# Patient Record
Sex: Female | Born: 1945
Health system: Southern US, Community
[De-identification: ages and names within clinical notes are randomized; demographics above are authoritative.]

## PROBLEM LIST (undated history)

## (undated) ENCOUNTER — Emergency Department (HOSPITAL_BASED_OUTPATIENT_CLINIC_OR_DEPARTMENT_OTHER): Admission: EM | Payer: Medicare Other | Source: Home / Self Care

## (undated) DIAGNOSIS — M549 Dorsalgia, unspecified: Secondary | ICD-10-CM

## (undated) DIAGNOSIS — M1990 Unspecified osteoarthritis, unspecified site: Secondary | ICD-10-CM

## (undated) DIAGNOSIS — G629 Polyneuropathy, unspecified: Secondary | ICD-10-CM

## (undated) DIAGNOSIS — L409 Psoriasis, unspecified: Secondary | ICD-10-CM

## (undated) DIAGNOSIS — C4490 Unspecified malignant neoplasm of skin, unspecified: Secondary | ICD-10-CM

## (undated) DIAGNOSIS — E119 Type 2 diabetes mellitus without complications: Secondary | ICD-10-CM

## (undated) DIAGNOSIS — Z853 Personal history of malignant neoplasm of breast: Secondary | ICD-10-CM

## (undated) DIAGNOSIS — IMO0001 Reserved for inherently not codable concepts without codable children: Secondary | ICD-10-CM

## (undated) DIAGNOSIS — T7840XA Allergy, unspecified, initial encounter: Secondary | ICD-10-CM

## (undated) DIAGNOSIS — I10 Essential (primary) hypertension: Secondary | ICD-10-CM

## (undated) DIAGNOSIS — R55 Syncope and collapse: Secondary | ICD-10-CM

## (undated) DIAGNOSIS — Z9889 Other specified postprocedural states: Secondary | ICD-10-CM

## (undated) DIAGNOSIS — G4733 Obstructive sleep apnea (adult) (pediatric): Secondary | ICD-10-CM

## (undated) DIAGNOSIS — E039 Hypothyroidism, unspecified: Secondary | ICD-10-CM

## (undated) DIAGNOSIS — R748 Abnormal levels of other serum enzymes: Secondary | ICD-10-CM

## (undated) DIAGNOSIS — E785 Hyperlipidemia, unspecified: Secondary | ICD-10-CM

## (undated) DIAGNOSIS — Z5189 Encounter for other specified aftercare: Secondary | ICD-10-CM

## (undated) DIAGNOSIS — H5440 Blindness, one eye, unspecified eye: Secondary | ICD-10-CM

## (undated) DIAGNOSIS — I890 Lymphedema, not elsewhere classified: Secondary | ICD-10-CM

## (undated) DIAGNOSIS — D689 Coagulation defect, unspecified: Secondary | ICD-10-CM

## (undated) DIAGNOSIS — G8929 Other chronic pain: Secondary | ICD-10-CM

## (undated) DIAGNOSIS — R112 Nausea with vomiting, unspecified: Secondary | ICD-10-CM

## (undated) DIAGNOSIS — D649 Anemia, unspecified: Secondary | ICD-10-CM

## (undated) DIAGNOSIS — C50919 Malignant neoplasm of unspecified site of unspecified female breast: Secondary | ICD-10-CM

## (undated) DIAGNOSIS — K8020 Calculus of gallbladder without cholecystitis without obstruction: Secondary | ICD-10-CM

## (undated) DIAGNOSIS — C801 Malignant (primary) neoplasm, unspecified: Secondary | ICD-10-CM

## (undated) DIAGNOSIS — K219 Gastro-esophageal reflux disease without esophagitis: Secondary | ICD-10-CM

## (undated) DIAGNOSIS — I2699 Other pulmonary embolism without acute cor pulmonale: Secondary | ICD-10-CM

## (undated) HISTORY — PX: MASTECTOMY: SHX3

## (undated) HISTORY — DX: Hyperlipidemia, unspecified: E78.5

## (undated) HISTORY — DX: Other pulmonary embolism without acute cor pulmonale: I26.99

## (undated) HISTORY — DX: Malignant (primary) neoplasm, unspecified: C80.1

## (undated) HISTORY — DX: Morbid (severe) obesity due to excess calories: E66.01

## (undated) HISTORY — DX: Syncope and collapse: R55

## (undated) HISTORY — DX: Coagulation defect, unspecified: D68.9

## (undated) HISTORY — DX: Unspecified malignant neoplasm of skin, unspecified: C44.90

## (undated) HISTORY — DX: Encounter for other specified aftercare: Z51.89

## (undated) HISTORY — PX: HIATAL HERNIA REPAIR: SHX195

## (undated) HISTORY — PX: EYE SURGERY: SHX253

## (undated) HISTORY — DX: Hypothyroidism, unspecified: E03.9

## (undated) HISTORY — DX: Personal history of malignant neoplasm of breast: Z85.3

## (undated) HISTORY — DX: Obstructive sleep apnea (adult) (pediatric): G47.33

## (undated) HISTORY — DX: Allergy, unspecified, initial encounter: T78.40XA

## (undated) HISTORY — DX: Blindness, one eye, unspecified eye: H54.40

## (undated) HISTORY — DX: Malignant neoplasm of unspecified site of unspecified female breast: C50.919

## (undated) HISTORY — DX: Gastro-esophageal reflux disease without esophagitis: K21.9

## (undated) HISTORY — DX: Essential (primary) hypertension: I10

## (undated) HISTORY — DX: Unspecified osteoarthritis, unspecified site: M19.90

## (undated) HISTORY — PX: ABDOMINAL HYSTERECTOMY: SHX81

## (undated) HISTORY — PX: BREAST SURGERY: SHX581

## (undated) HISTORY — DX: Reserved for inherently not codable concepts without codable children: IMO0001

## (undated) HISTORY — PX: OTHER SURGICAL HISTORY: SHX169

---

## 1992-07-31 HISTORY — PX: BONE MARROW TRANSPLANT: SHX200

## 1996-07-31 HISTORY — PX: KNEE ARTHROTOMY: SUR107

## 1998-11-02 ENCOUNTER — Ambulatory Visit (HOSPITAL_COMMUNITY): Admission: RE | Admit: 1998-11-02 | Discharge: 1998-11-02 | Payer: Self-pay | Admitting: Hematology and Oncology

## 1998-12-06 ENCOUNTER — Ambulatory Visit (HOSPITAL_COMMUNITY): Admission: RE | Admit: 1998-12-06 | Discharge: 1998-12-06 | Payer: Self-pay | Admitting: Endocrinology

## 1999-05-17 ENCOUNTER — Ambulatory Visit (HOSPITAL_COMMUNITY): Admission: RE | Admit: 1999-05-17 | Discharge: 1999-05-17 | Payer: Self-pay | Admitting: Hematology and Oncology

## 1999-05-17 ENCOUNTER — Encounter: Payer: Self-pay | Admitting: Hematology and Oncology

## 1999-11-16 ENCOUNTER — Encounter: Admission: RE | Admit: 1999-11-16 | Discharge: 1999-11-16 | Payer: Self-pay | Admitting: Endocrinology

## 1999-11-16 ENCOUNTER — Encounter: Payer: Self-pay | Admitting: Endocrinology

## 1999-11-29 ENCOUNTER — Encounter: Admission: RE | Admit: 1999-11-29 | Discharge: 2000-01-18 | Payer: Self-pay | Admitting: Surgery

## 2001-09-23 ENCOUNTER — Encounter: Payer: Self-pay | Admitting: Endocrinology

## 2001-09-23 ENCOUNTER — Encounter: Admission: RE | Admit: 2001-09-23 | Discharge: 2001-09-23 | Payer: Self-pay | Admitting: Endocrinology

## 2002-08-14 ENCOUNTER — Encounter: Admission: RE | Admit: 2002-08-14 | Discharge: 2002-09-12 | Payer: Self-pay | Admitting: Neurosurgery

## 2003-01-25 ENCOUNTER — Encounter: Payer: Self-pay | Admitting: Emergency Medicine

## 2003-01-25 ENCOUNTER — Emergency Department (HOSPITAL_COMMUNITY): Admission: EM | Admit: 2003-01-25 | Discharge: 2003-01-25 | Payer: Self-pay | Admitting: Emergency Medicine

## 2003-05-28 ENCOUNTER — Ambulatory Visit (HOSPITAL_COMMUNITY): Admission: RE | Admit: 2003-05-28 | Discharge: 2003-05-29 | Payer: Self-pay | Admitting: Ophthalmology

## 2003-11-05 ENCOUNTER — Encounter (HOSPITAL_COMMUNITY): Admission: RE | Admit: 2003-11-05 | Discharge: 2004-02-03 | Payer: Self-pay | Admitting: Endocrinology

## 2003-12-08 ENCOUNTER — Encounter: Admission: RE | Admit: 2003-12-08 | Discharge: 2003-12-08 | Payer: Self-pay | Admitting: Endocrinology

## 2004-06-03 ENCOUNTER — Encounter: Admission: RE | Admit: 2004-06-03 | Discharge: 2004-06-03 | Payer: Self-pay | Admitting: Endocrinology

## 2004-06-28 ENCOUNTER — Encounter: Admission: RE | Admit: 2004-06-28 | Discharge: 2004-06-28 | Payer: Self-pay | Admitting: Endocrinology

## 2004-08-19 ENCOUNTER — Ambulatory Visit: Payer: Self-pay | Admitting: Internal Medicine

## 2004-08-24 ENCOUNTER — Ambulatory Visit: Payer: Self-pay | Admitting: Internal Medicine

## 2004-08-29 ENCOUNTER — Ambulatory Visit: Payer: Self-pay | Admitting: Internal Medicine

## 2004-09-02 ENCOUNTER — Ambulatory Visit: Payer: Self-pay | Admitting: Internal Medicine

## 2004-09-06 ENCOUNTER — Ambulatory Visit: Payer: Self-pay | Admitting: Internal Medicine

## 2004-09-12 ENCOUNTER — Encounter (HOSPITAL_COMMUNITY): Admission: RE | Admit: 2004-09-12 | Discharge: 2004-12-11 | Payer: Self-pay | Admitting: Endocrinology

## 2004-09-14 ENCOUNTER — Ambulatory Visit: Payer: Self-pay | Admitting: Internal Medicine

## 2004-09-23 ENCOUNTER — Ambulatory Visit: Payer: Self-pay | Admitting: Internal Medicine

## 2004-09-26 ENCOUNTER — Ambulatory Visit: Payer: Self-pay | Admitting: Internal Medicine

## 2004-10-04 ENCOUNTER — Ambulatory Visit: Payer: Self-pay | Admitting: Internal Medicine

## 2004-10-07 ENCOUNTER — Ambulatory Visit: Payer: Self-pay | Admitting: Internal Medicine

## 2004-10-19 ENCOUNTER — Ambulatory Visit: Payer: Self-pay | Admitting: Internal Medicine

## 2004-10-20 ENCOUNTER — Ambulatory Visit: Payer: Self-pay | Admitting: Internal Medicine

## 2004-10-24 ENCOUNTER — Ambulatory Visit: Payer: Self-pay | Admitting: Internal Medicine

## 2004-10-28 ENCOUNTER — Ambulatory Visit: Payer: Self-pay | Admitting: Internal Medicine

## 2004-10-31 ENCOUNTER — Ambulatory Visit: Payer: Self-pay | Admitting: Internal Medicine

## 2004-11-22 ENCOUNTER — Ambulatory Visit: Payer: Self-pay | Admitting: Internal Medicine

## 2004-12-01 ENCOUNTER — Ambulatory Visit: Payer: Self-pay | Admitting: Internal Medicine

## 2005-01-10 ENCOUNTER — Ambulatory Visit: Payer: Self-pay | Admitting: Internal Medicine

## 2005-01-16 ENCOUNTER — Encounter (HOSPITAL_COMMUNITY): Admission: RE | Admit: 2005-01-16 | Discharge: 2005-01-24 | Payer: Self-pay | Admitting: Endocrinology

## 2005-01-17 ENCOUNTER — Ambulatory Visit: Payer: Self-pay | Admitting: Internal Medicine

## 2005-01-25 ENCOUNTER — Ambulatory Visit: Payer: Self-pay | Admitting: Internal Medicine

## 2005-02-01 ENCOUNTER — Ambulatory Visit: Payer: Self-pay | Admitting: Internal Medicine

## 2005-02-16 ENCOUNTER — Ambulatory Visit: Payer: Self-pay | Admitting: Internal Medicine

## 2005-02-21 ENCOUNTER — Ambulatory Visit: Payer: Self-pay | Admitting: Oncology

## 2005-03-27 ENCOUNTER — Ambulatory Visit: Payer: Self-pay | Admitting: Internal Medicine

## 2005-05-23 ENCOUNTER — Ambulatory Visit: Payer: Self-pay | Admitting: Internal Medicine

## 2005-05-23 HISTORY — PX: CARDIOVASCULAR STRESS TEST: SHX262

## 2005-06-01 ENCOUNTER — Ambulatory Visit: Payer: Self-pay | Admitting: Oncology

## 2005-06-02 ENCOUNTER — Ambulatory Visit: Payer: Self-pay | Admitting: Internal Medicine

## 2005-06-09 ENCOUNTER — Ambulatory Visit: Payer: Self-pay | Admitting: Internal Medicine

## 2005-07-18 ENCOUNTER — Ambulatory Visit: Payer: Self-pay | Admitting: Internal Medicine

## 2005-07-28 ENCOUNTER — Ambulatory Visit: Payer: Self-pay | Admitting: Internal Medicine

## 2005-08-01 ENCOUNTER — Encounter: Admission: RE | Admit: 2005-08-01 | Discharge: 2005-08-01 | Payer: Self-pay | Admitting: Endocrinology

## 2005-08-15 ENCOUNTER — Ambulatory Visit: Payer: Self-pay | Admitting: Internal Medicine

## 2005-09-04 ENCOUNTER — Ambulatory Visit: Payer: Self-pay | Admitting: Internal Medicine

## 2005-10-31 ENCOUNTER — Ambulatory Visit: Payer: Self-pay | Admitting: Oncology

## 2005-11-02 ENCOUNTER — Ambulatory Visit: Payer: Self-pay | Admitting: Internal Medicine

## 2005-11-02 LAB — CBC WITH DIFFERENTIAL/PLATELET
Basophils Absolute: 0.1 10*3/uL (ref 0.0–0.1)
EOS%: 1.7 % (ref 0.0–7.0)
HCT: 29.8 % — ABNORMAL LOW (ref 34.8–46.6)
HGB: 9.9 g/dL — ABNORMAL LOW (ref 11.6–15.9)
LYMPH%: 33.3 % (ref 14.0–48.0)
MCH: 28.9 pg (ref 26.0–34.0)
MCV: 87 fL (ref 81.0–101.0)
MONO%: 5.8 % (ref 0.0–13.0)
NEUT%: 58.3 % (ref 39.6–76.8)
Platelets: 224 10*3/uL (ref 145–400)

## 2005-11-21 ENCOUNTER — Ambulatory Visit: Payer: Self-pay | Admitting: Internal Medicine

## 2005-11-29 ENCOUNTER — Encounter: Payer: Self-pay | Admitting: Hematology and Oncology

## 2005-12-28 ENCOUNTER — Ambulatory Visit: Payer: Self-pay | Admitting: Internal Medicine

## 2006-01-30 ENCOUNTER — Ambulatory Visit: Payer: Self-pay | Admitting: Oncology

## 2006-02-02 ENCOUNTER — Ambulatory Visit (HOSPITAL_COMMUNITY): Admission: RE | Admit: 2006-02-02 | Discharge: 2006-02-02 | Payer: Self-pay | Admitting: Oncology

## 2006-02-07 ENCOUNTER — Ambulatory Visit: Payer: Self-pay | Admitting: Internal Medicine

## 2006-02-07 LAB — CBC WITH DIFFERENTIAL/PLATELET
Basophils Absolute: 0 10*3/uL (ref 0.0–0.1)
EOS%: 1.7 % (ref 0.0–7.0)
HCT: 31.5 % — ABNORMAL LOW (ref 34.8–46.6)
HGB: 10.5 g/dL — ABNORMAL LOW (ref 11.6–15.9)
MCH: 28.6 pg (ref 26.0–34.0)
MONO#: 0.3 10*3/uL (ref 0.1–0.9)
NEUT%: 64.3 % (ref 39.6–76.8)
lymph#: 1.7 10*3/uL (ref 0.9–3.3)

## 2006-02-15 ENCOUNTER — Ambulatory Visit: Payer: Self-pay | Admitting: Internal Medicine

## 2006-03-28 ENCOUNTER — Ambulatory Visit: Payer: Self-pay | Admitting: Oncology

## 2006-03-28 LAB — CBC WITH DIFFERENTIAL/PLATELET
BASO%: 1 % (ref 0.0–2.0)
Basophils Absolute: 0.1 10*3/uL (ref 0.0–0.1)
HCT: 35.1 % (ref 34.8–46.6)
HGB: 11.7 g/dL (ref 11.6–15.9)
MCHC: 33.3 g/dL (ref 32.0–36.0)
MONO#: 0.4 10*3/uL (ref 0.1–0.9)
NEUT%: 58.4 % (ref 39.6–76.8)
WBC: 5.8 10*3/uL (ref 3.9–10.0)
lymph#: 1.9 10*3/uL (ref 0.9–3.3)

## 2006-05-22 ENCOUNTER — Ambulatory Visit: Payer: Self-pay | Admitting: Internal Medicine

## 2006-08-03 ENCOUNTER — Emergency Department (HOSPITAL_COMMUNITY): Admission: EM | Admit: 2006-08-03 | Discharge: 2006-08-03 | Payer: Self-pay | Admitting: Emergency Medicine

## 2006-08-23 ENCOUNTER — Ambulatory Visit: Payer: Self-pay | Admitting: Oncology

## 2006-08-28 LAB — CBC WITH DIFFERENTIAL/PLATELET
EOS%: 1.8 % (ref 0.0–7.0)
LYMPH%: 27.4 % (ref 14.0–48.0)
MCH: 27.6 pg (ref 26.0–34.0)
MCV: 84.9 fL (ref 81.0–101.0)
MONO%: 1.7 % (ref 0.0–13.0)
RBC: 3.95 10*6/uL (ref 3.70–5.32)
RDW: 18.2 % — ABNORMAL HIGH (ref 11.3–14.5)

## 2006-08-28 LAB — FERRITIN: Ferritin: 828 ng/mL — ABNORMAL HIGH (ref 10–291)

## 2006-08-28 LAB — COMPREHENSIVE METABOLIC PANEL
ALT: 35 U/L (ref 0–35)
Albumin: 3.9 g/dL (ref 3.5–5.2)
Alkaline Phosphatase: 71 U/L (ref 39–117)
Glucose, Bld: 102 mg/dL — ABNORMAL HIGH (ref 70–99)
Potassium: 4 mEq/L (ref 3.5–5.3)
Sodium: 139 mEq/L (ref 135–145)
Total Bilirubin: 0.3 mg/dL (ref 0.3–1.2)
Total Protein: 6.7 g/dL (ref 6.0–8.3)

## 2006-09-18 ENCOUNTER — Ambulatory Visit: Payer: Self-pay | Admitting: Internal Medicine

## 2006-10-31 ENCOUNTER — Encounter: Admission: RE | Admit: 2006-10-31 | Discharge: 2006-10-31 | Payer: Self-pay | Admitting: Endocrinology

## 2006-11-13 ENCOUNTER — Ambulatory Visit: Payer: Self-pay | Admitting: Internal Medicine

## 2006-11-23 ENCOUNTER — Ambulatory Visit: Payer: Self-pay | Admitting: Oncology

## 2007-01-02 ENCOUNTER — Ambulatory Visit (HOSPITAL_BASED_OUTPATIENT_CLINIC_OR_DEPARTMENT_OTHER): Admission: RE | Admit: 2007-01-02 | Discharge: 2007-01-02 | Payer: Self-pay | Admitting: *Deleted

## 2007-01-06 ENCOUNTER — Ambulatory Visit: Payer: Self-pay | Admitting: Internal Medicine

## 2007-04-15 ENCOUNTER — Ambulatory Visit: Payer: Self-pay | Admitting: Oncology

## 2007-04-17 LAB — CBC WITH DIFFERENTIAL/PLATELET
BASO%: 0.9 % (ref 0.0–2.0)
Basophils Absolute: 0 10*3/uL (ref 0.0–0.1)
Eosinophils Absolute: 0.1 10*3/uL (ref 0.0–0.5)
HCT: 34.3 % — ABNORMAL LOW (ref 34.8–46.6)
HGB: 11.4 g/dL — ABNORMAL LOW (ref 11.6–15.9)
LYMPH%: 24.8 % (ref 14.0–48.0)
MONO#: 0.3 10*3/uL (ref 0.1–0.9)
NEUT#: 3.4 10*3/uL (ref 1.5–6.5)
NEUT%: 66.4 % (ref 39.6–76.8)
Platelets: 226 10*3/uL (ref 145–400)
WBC: 5.1 10*3/uL (ref 3.9–10.0)
lymph#: 1.3 10*3/uL (ref 0.9–3.3)

## 2007-04-17 LAB — COMPREHENSIVE METABOLIC PANEL
ALT: 20 U/L (ref 0–35)
AST: 20 U/L (ref 0–37)
CO2: 24 mEq/L (ref 19–32)
Calcium: 9.4 mg/dL (ref 8.4–10.5)
Chloride: 104 mEq/L (ref 96–112)
Sodium: 141 mEq/L (ref 135–145)
Total Bilirubin: 0.3 mg/dL (ref 0.3–1.2)
Total Protein: 7.4 g/dL (ref 6.0–8.3)

## 2007-04-17 LAB — IRON AND TIBC
%SAT: 14 % — ABNORMAL LOW (ref 20–55)
TIBC: 315 ug/dL (ref 250–470)
UIBC: 271 ug/dL

## 2007-06-23 ENCOUNTER — Emergency Department (HOSPITAL_COMMUNITY): Admission: EM | Admit: 2007-06-23 | Discharge: 2007-06-23 | Payer: Self-pay | Admitting: Emergency Medicine

## 2007-07-04 ENCOUNTER — Encounter
Admission: RE | Admit: 2007-07-04 | Discharge: 2007-07-04 | Payer: Self-pay | Admitting: Physical Medicine and Rehabilitation

## 2007-08-26 ENCOUNTER — Ambulatory Visit: Payer: Self-pay | Admitting: Vascular Surgery

## 2007-09-25 DIAGNOSIS — J4530 Mild persistent asthma, uncomplicated: Secondary | ICD-10-CM

## 2007-09-25 DIAGNOSIS — I2699 Other pulmonary embolism without acute cor pulmonale: Secondary | ICD-10-CM | POA: Insufficient documentation

## 2007-09-25 DIAGNOSIS — J302 Other seasonal allergic rhinitis: Secondary | ICD-10-CM

## 2007-09-25 DIAGNOSIS — J3089 Other allergic rhinitis: Secondary | ICD-10-CM

## 2007-09-25 DIAGNOSIS — Z853 Personal history of malignant neoplasm of breast: Secondary | ICD-10-CM

## 2007-09-26 ENCOUNTER — Ambulatory Visit: Payer: Self-pay | Admitting: Internal Medicine

## 2007-09-29 DIAGNOSIS — G4733 Obstructive sleep apnea (adult) (pediatric): Secondary | ICD-10-CM

## 2007-10-11 ENCOUNTER — Ambulatory Visit: Payer: Self-pay | Admitting: Oncology

## 2007-10-15 LAB — CBC WITH DIFFERENTIAL/PLATELET
BASO%: 1.6 % (ref 0.0–2.0)
MCHC: 32.3 g/dL (ref 32.0–36.0)
MONO#: 0.3 10*3/uL (ref 0.1–0.9)
RBC: 4.27 10*6/uL (ref 3.70–5.32)
RDW: 15.2 % — ABNORMAL HIGH (ref 11.3–14.5)
WBC: 5.6 10*3/uL (ref 3.9–10.0)
lymph#: 2.1 10*3/uL (ref 0.9–3.3)

## 2007-10-15 LAB — FERRITIN: Ferritin: 425 ng/mL — ABNORMAL HIGH (ref 10–291)

## 2007-10-15 LAB — IRON AND TIBC: Iron: 48 ug/dL (ref 42–145)

## 2007-12-09 ENCOUNTER — Encounter: Admission: RE | Admit: 2007-12-09 | Discharge: 2007-12-09 | Payer: Self-pay | Admitting: Endocrinology

## 2007-12-24 ENCOUNTER — Ambulatory Visit: Payer: Self-pay | Admitting: Internal Medicine

## 2008-01-28 ENCOUNTER — Ambulatory Visit: Payer: Self-pay | Admitting: Internal Medicine

## 2008-02-03 ENCOUNTER — Telehealth (INDEPENDENT_AMBULATORY_CARE_PROVIDER_SITE_OTHER): Payer: Self-pay | Admitting: *Deleted

## 2008-04-23 ENCOUNTER — Ambulatory Visit: Payer: Self-pay | Admitting: Oncology

## 2008-04-30 LAB — IRON AND TIBC
Iron: 53 ug/dL (ref 42–145)
TIBC: 353 ug/dL (ref 250–470)
UIBC: 300 ug/dL

## 2008-04-30 LAB — CBC WITH DIFFERENTIAL/PLATELET
Basophils Absolute: 0 10*3/uL (ref 0.0–0.1)
Eosinophils Absolute: 0.1 10*3/uL (ref 0.0–0.5)
HCT: 35.4 % (ref 34.8–46.6)
HGB: 11.8 g/dL (ref 11.6–15.9)
LYMPH%: 30.3 % (ref 14.0–48.0)
MCV: 85.1 fL (ref 81.0–101.0)
MONO%: 4.7 % (ref 0.0–13.0)
NEUT#: 3.3 10*3/uL (ref 1.5–6.5)
NEUT%: 63 % (ref 39.6–76.8)
Platelets: 216 10*3/uL (ref 145–400)

## 2008-04-30 LAB — COMPREHENSIVE METABOLIC PANEL
Alkaline Phosphatase: 75 U/L (ref 39–117)
BUN: 20 mg/dL (ref 6–23)
CO2: 25 mEq/L (ref 19–32)
Glucose, Bld: 101 mg/dL — ABNORMAL HIGH (ref 70–99)
Total Bilirubin: 0.3 mg/dL (ref 0.3–1.2)

## 2008-04-30 LAB — FERRITIN: Ferritin: 566 ng/mL — ABNORMAL HIGH (ref 10–291)

## 2008-05-13 ENCOUNTER — Encounter: Admission: RE | Admit: 2008-05-13 | Discharge: 2008-07-13 | Payer: Self-pay | Admitting: Oncology

## 2008-07-22 ENCOUNTER — Encounter: Admission: RE | Admit: 2008-07-22 | Discharge: 2008-07-22 | Payer: Self-pay | Admitting: Endocrinology

## 2008-07-31 LAB — HM MAMMOGRAPHY: HM Mammogram: NORMAL

## 2008-10-26 ENCOUNTER — Ambulatory Visit: Payer: Self-pay | Admitting: Oncology

## 2008-10-27 HISTORY — PX: US ECHOCARDIOGRAPHY: HXRAD669

## 2008-10-29 LAB — CBC WITH DIFFERENTIAL/PLATELET
BASO%: 1.3 % (ref 0.0–2.0)
Basophils Absolute: 0.1 10*3/uL (ref 0.0–0.1)
EOS%: 1.9 % (ref 0.0–7.0)
HCT: 35.4 % (ref 34.8–46.6)
HGB: 11.5 g/dL — ABNORMAL LOW (ref 11.6–15.9)
LYMPH%: 38.6 % (ref 14.0–49.7)
MCH: 27.1 pg (ref 25.1–34.0)
MCHC: 32.6 g/dL (ref 31.5–36.0)
MCV: 83 fL (ref 79.5–101.0)
NEUT%: 53.3 % (ref 38.4–76.8)
Platelets: 207 10*3/uL (ref 145–400)
lymph#: 1.9 10*3/uL (ref 0.9–3.3)

## 2008-10-29 LAB — COMPREHENSIVE METABOLIC PANEL
ALT: 14 U/L (ref 0–35)
AST: 17 U/L (ref 0–37)
BUN: 13 mg/dL (ref 6–23)
Calcium: 9.4 mg/dL (ref 8.4–10.5)
Chloride: 104 mEq/L (ref 96–112)
Creatinine, Ser: 1.09 mg/dL (ref 0.40–1.20)
Total Bilirubin: 0.3 mg/dL (ref 0.3–1.2)

## 2008-10-29 LAB — IRON AND TIBC
%SAT: 19 % — ABNORMAL LOW (ref 20–55)
Iron: 62 ug/dL (ref 42–145)
TIBC: 323 ug/dL (ref 250–470)

## 2008-10-29 LAB — FERRITIN: Ferritin: 470 ng/mL — ABNORMAL HIGH (ref 10–291)

## 2008-12-21 ENCOUNTER — Encounter: Admission: RE | Admit: 2008-12-21 | Discharge: 2008-12-21 | Payer: Self-pay | Admitting: Endocrinology

## 2008-12-23 ENCOUNTER — Ambulatory Visit: Payer: Self-pay | Admitting: Internal Medicine

## 2009-01-13 ENCOUNTER — Encounter: Payer: Self-pay | Admitting: Internal Medicine

## 2009-01-19 ENCOUNTER — Telehealth (INDEPENDENT_AMBULATORY_CARE_PROVIDER_SITE_OTHER): Payer: Self-pay | Admitting: *Deleted

## 2009-02-17 ENCOUNTER — Emergency Department (HOSPITAL_COMMUNITY): Admission: EM | Admit: 2009-02-17 | Discharge: 2009-02-17 | Payer: Self-pay | Admitting: Emergency Medicine

## 2009-02-25 ENCOUNTER — Ambulatory Visit (HOSPITAL_COMMUNITY): Admission: RE | Admit: 2009-02-25 | Discharge: 2009-02-25 | Payer: Self-pay | Admitting: *Deleted

## 2009-03-23 ENCOUNTER — Ambulatory Visit: Payer: Self-pay | Admitting: Internal Medicine

## 2009-03-30 ENCOUNTER — Telehealth: Payer: Self-pay | Admitting: Internal Medicine

## 2009-04-26 ENCOUNTER — Encounter: Admission: RE | Admit: 2009-04-26 | Discharge: 2009-04-26 | Payer: Self-pay | Admitting: Surgery

## 2009-04-28 ENCOUNTER — Ambulatory Visit: Payer: Self-pay | Admitting: Oncology

## 2009-04-30 LAB — COMPREHENSIVE METABOLIC PANEL
ALT: 20 U/L (ref 0–35)
Albumin: 3.8 g/dL (ref 3.5–5.2)
CO2: 19 mEq/L (ref 19–32)
Calcium: 8.9 mg/dL (ref 8.4–10.5)
Chloride: 106 mEq/L (ref 96–112)
Glucose, Bld: 104 mg/dL — ABNORMAL HIGH (ref 70–99)
Potassium: 3.8 mEq/L (ref 3.5–5.3)
Sodium: 139 mEq/L (ref 135–145)
Total Bilirubin: 0.3 mg/dL (ref 0.3–1.2)
Total Protein: 6.7 g/dL (ref 6.0–8.3)

## 2009-04-30 LAB — CBC WITH DIFFERENTIAL/PLATELET
BASO%: 0.8 % (ref 0.0–2.0)
Eosinophils Absolute: 0.1 10*3/uL (ref 0.0–0.5)
HGB: 11.5 g/dL — ABNORMAL LOW (ref 11.6–15.9)
LYMPH%: 31.5 % (ref 14.0–49.7)
MCH: 27.3 pg (ref 25.1–34.0)
MCHC: 32.7 g/dL (ref 31.5–36.0)
MCV: 83.6 fL (ref 79.5–101.0)
MONO#: 0.4 10*3/uL (ref 0.1–0.9)
RBC: 4.21 10*6/uL (ref 3.70–5.45)
lymph#: 2.3 10*3/uL (ref 0.9–3.3)

## 2009-04-30 LAB — FERRITIN: Ferritin: 560 ng/mL — ABNORMAL HIGH (ref 10–291)

## 2009-04-30 LAB — IRON AND TIBC: Iron: 55 ug/dL (ref 42–145)

## 2009-05-03 ENCOUNTER — Ambulatory Visit (HOSPITAL_COMMUNITY): Admission: RE | Admit: 2009-05-03 | Discharge: 2009-05-03 | Payer: Self-pay | Admitting: Surgery

## 2009-05-04 ENCOUNTER — Ambulatory Visit (HOSPITAL_COMMUNITY): Admission: RE | Admit: 2009-05-04 | Discharge: 2009-05-04 | Payer: Self-pay | Admitting: Surgery

## 2009-07-12 ENCOUNTER — Ambulatory Visit (HOSPITAL_COMMUNITY): Admission: RE | Admit: 2009-07-12 | Discharge: 2009-07-12 | Payer: Self-pay | Admitting: Surgery

## 2009-07-31 DIAGNOSIS — R55 Syncope and collapse: Secondary | ICD-10-CM

## 2009-07-31 HISTORY — PX: GASTRIC BYPASS: SHX52

## 2009-07-31 HISTORY — DX: Syncope and collapse: R55

## 2009-09-21 ENCOUNTER — Ambulatory Visit: Payer: Self-pay | Admitting: Internal Medicine

## 2009-10-14 ENCOUNTER — Encounter: Admission: RE | Admit: 2009-10-14 | Discharge: 2010-01-12 | Payer: Self-pay | Admitting: Surgery

## 2009-10-22 ENCOUNTER — Ambulatory Visit: Payer: Self-pay | Admitting: Oncology

## 2009-10-26 LAB — CBC WITH DIFFERENTIAL/PLATELET
EOS%: 0.7 % (ref 0.0–7.0)
Eosinophils Absolute: 0 10*3/uL (ref 0.0–0.5)
LYMPH%: 28.7 % (ref 14.0–49.7)
MCH: 27.8 pg (ref 25.1–34.0)
MCHC: 32.7 g/dL (ref 31.5–36.0)
MCV: 85.2 fL (ref 79.5–101.0)
MONO%: 4.9 % (ref 0.0–14.0)
NEUT#: 3.3 10*3/uL (ref 1.5–6.5)
NEUT%: 65.1 % (ref 38.4–76.8)
RDW: 16.1 % — ABNORMAL HIGH (ref 11.2–14.5)
lymph#: 1.5 10*3/uL (ref 0.9–3.3)

## 2009-10-26 LAB — COMPREHENSIVE METABOLIC PANEL
ALT: 15 U/L (ref 0–35)
AST: 19 U/L (ref 0–37)
Albumin: 4.3 g/dL (ref 3.5–5.2)
Alkaline Phosphatase: 76 U/L (ref 39–117)
CO2: 23 mEq/L (ref 19–32)
Creatinine, Ser: 1.04 mg/dL (ref 0.40–1.20)
Total Bilirubin: 0.4 mg/dL (ref 0.3–1.2)
Total Protein: 7.3 g/dL (ref 6.0–8.3)

## 2009-10-26 LAB — IRON AND TIBC
Iron: 47 ug/dL (ref 42–145)
TIBC: 306 ug/dL (ref 250–470)
UIBC: 259 ug/dL

## 2009-10-26 LAB — FERRITIN: Ferritin: 537 ng/mL — ABNORMAL HIGH (ref 10–291)

## 2009-10-28 ENCOUNTER — Ambulatory Visit (HOSPITAL_COMMUNITY): Admission: RE | Admit: 2009-10-28 | Discharge: 2009-10-28 | Payer: Self-pay | Admitting: Surgery

## 2009-11-01 ENCOUNTER — Inpatient Hospital Stay (HOSPITAL_COMMUNITY): Admission: RE | Admit: 2009-11-01 | Discharge: 2009-11-05 | Payer: Self-pay | Admitting: Surgery

## 2009-11-01 ENCOUNTER — Encounter (INDEPENDENT_AMBULATORY_CARE_PROVIDER_SITE_OTHER): Payer: Self-pay | Admitting: Surgery

## 2009-11-02 ENCOUNTER — Ambulatory Visit: Payer: Self-pay | Admitting: Vascular Surgery

## 2009-11-02 ENCOUNTER — Encounter (INDEPENDENT_AMBULATORY_CARE_PROVIDER_SITE_OTHER): Payer: Self-pay | Admitting: Surgery

## 2009-12-21 ENCOUNTER — Inpatient Hospital Stay (HOSPITAL_COMMUNITY)
Admission: EM | Admit: 2009-12-21 | Discharge: 2009-12-25 | Payer: Self-pay | Source: Home / Self Care | Admitting: Emergency Medicine

## 2009-12-22 ENCOUNTER — Ambulatory Visit: Payer: Self-pay | Admitting: Vascular Surgery

## 2010-01-04 ENCOUNTER — Ambulatory Visit: Admission: RE | Admit: 2010-01-04 | Discharge: 2010-01-04 | Payer: Self-pay | Admitting: Endocrinology

## 2010-01-04 ENCOUNTER — Encounter (INDEPENDENT_AMBULATORY_CARE_PROVIDER_SITE_OTHER): Payer: Self-pay | Admitting: Endocrinology

## 2010-01-04 ENCOUNTER — Inpatient Hospital Stay (HOSPITAL_COMMUNITY): Admission: EM | Admit: 2010-01-04 | Discharge: 2010-01-05 | Payer: Self-pay | Admitting: Emergency Medicine

## 2010-03-22 ENCOUNTER — Ambulatory Visit: Payer: Self-pay | Admitting: Internal Medicine

## 2010-04-29 ENCOUNTER — Ambulatory Visit: Payer: Self-pay | Admitting: Oncology

## 2010-05-03 LAB — CBC WITH DIFFERENTIAL/PLATELET
BASO%: 0.7 % (ref 0.0–2.0)
Basophils Absolute: 0 10*3/uL (ref 0.0–0.1)
EOS%: 3.4 % (ref 0.0–7.0)
HGB: 13 g/dL (ref 11.6–15.9)
LYMPH%: 34.3 % (ref 14.0–49.7)
MCHC: 32.8 g/dL (ref 31.5–36.0)
MCV: 84.3 fL (ref 79.5–101.0)
MONO%: 5.6 % (ref 0.0–14.0)
Platelets: 207 10*3/uL (ref 145–400)
RDW: 16.1 % — ABNORMAL HIGH (ref 11.2–14.5)
WBC: 4.4 10*3/uL (ref 3.9–10.3)
nRBC: 0 % (ref 0–0)

## 2010-05-03 LAB — COMPREHENSIVE METABOLIC PANEL
ALT: 26 U/L (ref 0–35)
AST: 25 U/L (ref 0–37)
Albumin: 4.2 g/dL (ref 3.5–5.2)
Alkaline Phosphatase: 125 U/L — ABNORMAL HIGH (ref 39–117)
BUN: 9 mg/dL (ref 6–23)
Calcium: 9.5 mg/dL (ref 8.4–10.5)
Chloride: 106 mEq/L (ref 96–112)
Potassium: 4.1 mEq/L (ref 3.5–5.3)
Total Protein: 6.8 g/dL (ref 6.0–8.3)

## 2010-08-20 ENCOUNTER — Encounter: Payer: Self-pay | Admitting: Endocrinology

## 2010-08-21 ENCOUNTER — Encounter: Payer: Self-pay | Admitting: Endocrinology

## 2010-08-30 NOTE — Assessment & Plan Note (Signed)
Summary: rov 6 months///kp   Primary Provider/Referring Provider:  Lucianne Muss  CC:  Pt here for 6 month follow up. Pt c/o "coughing spells" with small amount of white mucus, runny nose, and and 10 pound weight increase x 1 week.  History of Present Illness: History of Present Illness: 65 year old woman returning for follow-up of asthma and allergic rhinitis with history of hypersomnia and sleep apnea, pulmonary embolism, and breast cancer.  Daughter-in-law, developed a cough, 6 weeks ago.  Five days ago.  Patient had onset of head congestion and now feels stopped up.  Coughing thick, white sputum.  Fever without myalgias.  Sore throat.  No GI upset.  12/23/08- OSA, Asthma, allergic rhinitis, hx Pulm Embolism Uses cpap most nights- too much stuff- difficult to get up to bathroom- can't get back to sleep and not convinvinced she sleeps better. HS 830-9PM. Up around 230-3AM then reads or watches TV or lies in dark. Gets out of bed around 6AM and stays up with no naps. She c/o mask makes her sweat. Wears full face mask with chin strap.Says Dr Lucianne Muss working on chest wall cramps, weak spells, heartburn. denies cough and wheeze. Minor nasal stuffiness during Spring pollen. Being treated for anemia.  03/23/09- OSA, Asthma, allergic rhinitis, hx Pulm Embolism OSA- We had DME try to work with her on cpap comfort. Autotitration changed her from 11 to 6 and changed her to a nasaql pillows mask. She now says she "loves" it. She is able to wear it and sleep a whole lot  better than before. Wakes briefly around 1AM, then sleeps til 8 AM. asthma- Has her meds and feels in good control with no recent flare or concerns.  September 21, 2009- Asthma, allergic rhinitis, hx PE, OSA/ CPAP Occasional paroxysmal cough with scant mucus. Had flu and pneumovax last Fall. No obvious respiratory infection. Cough has woken her from sleep. Sips of water help. Some sneeze and blowing, but more sense of PNDrip. Watery rhinorhea.  Continues flonase and Advair. Little wheeze. Continues omeprazole. Notes oc choke with food or drink. Aware of reflux, watches diet due to gallstones.  Compliant with CPAP at 6 and likes nasal pillows mask.     Current Medications (verified): 1)  Advair Diskus 100-50 Mcg/dose Misc (Fluticasone-Salmeterol) .... Inhale 1 Puff As Directed Twice A Day 2)  Flonase 50 Mcg/act  Susp (Fluticasone Propionate) .... Two Puffs Each Nostril Daily 3)  Cpap 6 [facility] 4)  Metformin Hcl 500 Mg  Tabs (Metformin Hcl) .... Take 1 Tablet in The Am and 2 Tablets in The Pm 5)  Lipitor 40 Mg  Tabs (Atorvastatin Calcium) .... Take 1 Tablet By Mouth Once A Day 6)  Levothyroxine Sodium 125 Mcg Tabs (Levothyroxine Sodium) .... Take 1 By Mouth Once Daily 7)  Vitamin B-12 500 Mcg  Tabs (Cyanocobalamin) .... Once Daily 8)  Ferro-Bob 325 (65 Fe) Mg  Tabs (Ferrous Sulfate) .... Once Daily 9)  Coumadin 6 Mg  Tabs (Warfarin Sodium) .... As Directed 10)  Acetaminophen 325 Mg  Tabs (Acetaminophen) 11)  Omeprazole 20 Mg Cpdr (Omeprazole) .... Take 1 By Mouth Once Daily 12)  Gabapentin 300 Mg Caps (Gabapentin) .... Take 1 By Mouth Once Daily or As Directed 13)  Vitamin D (Ergocalciferol) 50000 Unit Caps (Ergocalciferol) .... Take 1 By Mouth Weekly 14)  Lasix 20 Mg Tabs (Furosemide) .... Take 1 Tablet By Mouth Once A Day 15)  Welchol 3.75 Gm Pack (Colesevelam Hcl) .... Every Other Day 16)  Hydrocodone 500mg  .... 1-2  Tabs As Needed Pain  Allergies (verified): No Known Drug Allergies  Past History:  Past Medical History: Last updated: December 30, 2008 Allergic Rhinitis Asthma Obstructive sleep apnea Pulmonary Embolism- chronic coumadin Breast cancer, hx of  Past Surgical History: Last updated: Dec 30, 2008 Right mastectomy Total Abdominal Hysterectomy bone spurs left knee  Family History: Last updated: Dec 31, 2007 Mother- deceased age 51; car accident Father- deceased age 22; lung cancer Sister- deceased age 35l lung  cancer 2 Brothers- living ages 6, 79; no medical problems  Asthma- daughter, grandmother Cancer- Father, Sister, aunt  Social History: Last updated: 31-Dec-2007 Patient states former smoker.  Retired Widowed with 2 children  Risk Factors: Smoking Status: quit (09/26/2007)  Review of Systems      See HPI       The patient complains of prolonged cough.  The patient denies anorexia, fever, weight loss, weight gain, vision loss, decreased hearing, hoarseness, chest pain, syncope, dyspnea on exertion, peripheral edema, headaches, hemoptysis, abdominal pain, and severe indigestion/heartburn.    Vital Signs:  Patient profile:   65 year old female Height:      70 inches Weight:      337.13 pounds O2 Sat:      96 % on Room air Pulse rate:   78 / minute BP sitting:   120 / 82  (left arm) Cuff size:   large  Vitals Entered By: Zackery Barefoot CMA (September 21, 2009 9:37 AM)  O2 Flow:  Room air CC: Pt here for 6 month follow up. Pt c/o "coughing spells" with small amount of white mucus, runny nose, and 10 pound weight increase x 1 week Comments Medications reviewed with patient Verified contact number and pharmacy with patient Zackery Barefoot CMA  September 21, 2009 9:39 AM    Physical Exam  Additional Exam:  General: A/Ox3; pleasant and cooperative, NAD, alert, very obese,  SKIN: no rash, lesions NODES: no lymphadenopathy HEENT: [place]/AT, EOM- WNL, Conjuctivae- clear, PERRLA, TM-WNL, Nose- clear, Throat- clear and wnl, Melampatti III NECK: Supple w/ fair ROM, JVD- none, normal carotid impulses w/o bruits Thyroid-, no stridor CHEST: Clear to P&A HEART: RRR, no m/g/r heard ABDOMEN: soft NWG:NFAO, nl pulses, no edema, heavy legs NEURO: Grossly intact to observation      Impression & Recommendations:  Problem # 1:  SLEEP APNEA (ICD-780.57)  Good cpap compliance and control.  Weight loss advised.  Problem # 2:  ASTHMA (ICD-493.90) Good control I suspect occasional reflux  causes more of her occasional paroxysmal coughs.  Problem # 3:  ALLERGIC RHINITIS (ICD-477.9)  Flonase helps some, but I advise use of otc antihistamine. Her updated medication list for this problem includes:    Flonase 50 Mcg/act Susp (Fluticasone propionate) .Marland Kitchen..Marland Kitchen Two puffs each nostril daily  Orders: Est. Patient Level III (13086)  Medications Added to Medication List This Visit: 1)  Lasix 20 Mg Tabs (Furosemide) .... Take 1 tablet by mouth once a day 2)  Welchol 3.75 Gm Pack (Colesevelam hcl) .... Every other day 3)  Hydrocodone 500mg   .... 1-2 tabs as needed pain  Patient Instructions: 1)  Please schedule a follow-up appointment in 6 months. 2)  Consider trying an otc antihistamine like Loratadine/ Claritin to dry up watery nose. You can take it once daily as needed. See if it reduces how often you have those coughing fits. 3)  Continue CPAP at 6.   Immunization History:  Influenza Immunization History:    Influenza:  historical (03/31/2009)

## 2010-08-30 NOTE — Assessment & Plan Note (Signed)
Summary: rov 6 months///kp   Primary Provider/Referring Provider:  Lucianne Muss  CC:  6 month follow up visit-sinus issues and cough-productive at times in the night  and morning.Marland Kitchen  History of Present Illness:  12/23/08- OSA, Asthma, allergic rhinitis, hx Pulm Embolism Uses cpap most nights- too much stuff- difficult to get up to bathroom- can't get back to sleep and not convinvinced she sleeps better. HS 830-9PM. Up around 230-3AM then reads or watches TV or lies in dark. Gets out of bed around 6AM and stays up with no naps. She c/o mask makes her sweat. Wears full face mask with chin strap.Says Dr Lucianne Muss working on chest wall cramps, weak spells, heartburn. denies cough and wheeze. Minor nasal stuffiness during Spring pollen. Being treated for anemia.  03/23/09- OSA, Asthma, allergic rhinitis, hx Pulm Embolism OSA- We had DME try to work with her on cpap comfort. Autotitration changed her from 11 to 6 and changed her to a nasal pillows mask. She now says she "loves" it. She is able to wear it and sleep a whole lot  better than before. Wakes briefly around 1AM, then sleeps til 8 AM. asthma- Has her meds and feels in good control with no recent flare or concerns.  March 22, 2010- OSA, Allergic rhinitis, hx Pulm embolism/ DVT/ Coumadin Some nights she skips CPAP. We discussed study showing more dementia when women with OSA don't  use CPAP. She likes nasal pillows mask. Pressure remains at 6. Asthma- uses Advair daily, but doesn't have a rescue inhaler. If she stays out in heat she may note a little tightness. This does sometimes wake her at night so she wil get up and walk around a little. Had gastric bypass, then C.diff colitis, then DVT treated with IVC filter and adjustment of coumadin.   Asthma History    Initial Asthma Severity Rating:    Age range: 12+ years    Symptoms: 0-2 days/week    Nighttime Awakenings: 3-4/month    Interferes w/ normal activity: no limitations    SABA use (not for  EIB): 0-2 days/week    Asthma Severity Assessment: Mild Persistent   Preventive Screening-Counseling & Management  Alcohol-Tobacco     Smoking Status: quit     Packs/Day: 2.0     Year Started: Age 6     Year Quit: 30 years ago     Tobacco Counseling: not to resume use of tobacco products  Current Medications (verified): 1)  Advair Diskus 100-50 Mcg/dose Misc (Fluticasone-Salmeterol) .... Inhale 1 Puff As Directed Twice A Day 2)  Flonase 50 Mcg/act  Susp (Fluticasone Propionate) .... Two Puffs Each Nostril Daily 3)  Cpap 6 [facility] 4)  Lipitor 40 Mg  Tabs (Atorvastatin Calcium) .... Take 1 Tablet By Mouth Once A Day 5)  Levothyroxine Sodium 125 Mcg Tabs (Levothyroxine Sodium) .... Take 1 By Mouth Once Daily 6)  Vitamin B-12 500 Mcg  Tabs (Cyanocobalamin) .... Once Daily 7)  Ferro-Bob 325 (65 Fe) Mg  Tabs (Ferrous Sulfate) .... Once Daily 8)  Coumadin 6 Mg  Tabs (Warfarin Sodium) .... Take 5mg  On Sun and Wed; 7.5mg  Other 5 Days 9)  Omeprazole 20 Mg Cpdr (Omeprazole) .... Take 1 By Mouth Once Daily 10)  Gabapentin 300 Mg Caps (Gabapentin) .... Take 1 By Mouth Once Daily or As Directed 11)  Vitamin D (Ergocalciferol) 50000 Unit Caps (Ergocalciferol) .... Take 1 By Mouth Weekly 12)  Lasix 20 Mg Tabs (Furosemide) .... Take 1 Tablet By Mouth Once A Day 13)  Hydrocodone 500mg  .... 1-2 Tabs As Needed Pain  Allergies (verified): No Known Drug Allergies  Past History:  Family History: Last updated: 01-10-08 Mother- deceased age 69; car accident Father- deceased age 38; lung cancer Sister- deceased age 4l lung cancer 2 Brothers- living ages 45, 83; no medical problems  Asthma- daughter, grandmother Cancer- Father, Sister, aunt  Social History: Last updated: 01-10-2008 Patient states former smoker.  Retired Widowed with 2 children  Risk Factors: Smoking Status: quit (03/22/2010) Packs/Day: 2.0 (03/22/2010)  Past Medical History: Allergic Rhinitis Asthma Obstructive sleep  apnea Pulmonary Embolism- chronic coumadin Breast cancer, hx of Blind right eye- hemorrhage  Past Surgical History: Right mastectomy Total Abdominal Hysterectomy bone spurs left knee Gastric bypass- bariatric surgery- 2011 IVC filter for recurrent DVT  Social History: Packs/Day:  2.0  Review of Systems      See HPI  The patient denies shortness of breath with activity, shortness of breath at rest, productive cough, non-productive cough, coughing up blood, chest pain, irregular heartbeats, acid heartburn, indigestion, loss of appetite, weight change, abdominal pain, difficulty swallowing, sore throat, tooth/dental problems, headaches, nasal congestion/difficulty breathing through nose, and sneezing.    Vital Signs:  Patient profile:   65 year old female Height:      70 inches Weight:      261.38 pounds BMI:     37.64 O2 Sat:      100 % on Room air Pulse rate:   71 / minute BP sitting:   104 / 64  (left arm) Cuff size:   large  Vitals Entered By: Reynaldo Minium CMA (March 22, 2010 9:56 AM)  O2 Flow:  Room air CC: 6 month follow up visit-sinus issues and cough-productive at times in the night  and morning.   Physical Exam  Additional Exam:  General: A/Ox3; pleasant and cooperative, NAD, alert,  obese,  SKIN: no rash, lesions NODES: no lymphadenopathy HEENT: [place]/AT, EOM- WNL, Conjuctivae- clear, PERRLA, TM-WNL, Nose- clear, Throat- clear and wnl, Mallampati III NECK: Supple w/ fair ROM, JVD- none, normal carotid impulses w/o bruits Thyroid-, no stridor CHEST: Clear to P&A HEART: RRR, no m/g/r heard ABDOMEN: soft ZOX:WRUE, nl pulses, no edema, heavy legs NEURO: Grossly intact to observation      Impression & Recommendations:  Problem # 1:  SLEEP APNEA (ICD-780.57)  Encouraged routine compliance with her CPAP and further weight loss.   Problem # 2:  Hx of PULMONARY EMBOLISM (ICD-415.19)  Recurrent DVT on chronic coumadin per Dr Clelia Croft at [facility]. Her updated  medication list for this problem includes:    Coumadin 6 Mg Tabs (Warfarin sodium) .Marland Kitchen... Take 5mg  on sun and wed; 7.5mg  other 5 days  Problem # 3:  ASTHMA (ICD-493.90) We discussed management and will give rescue inhaler and refill meds as discussed.  Medications Added to Medication List This Visit: 1)  Coumadin 6 Mg Tabs (Warfarin sodium) .... Take 5mg  on sun and wed; 7.5mg  other 5 days 2)  Proair Hfa 108 (90 Base) Mcg/act Aers (Albuterol sulfate) .... 2 puffs four times a day as needed rescue inhaler  Other Orders: Est. Patient Level IV (45409)  Patient Instructions: 1)  Please schedule a follow-up appointment in 1 year. 2)  Please keep using CPAP every night. Let us know if there are problems. 3)  Med refills and script for a rescue inhaler sent to drug store. Prescriptions: FLONASE 50 MCG/ACT  SUSP (FLUTICASONE PROPIONATE) Two puffs each nostril daily  #1 x prn   Entered and Authorized  by:   Waymon Budge MD   Signed by:   Waymon Budge MD on 03/22/2010   Method used:   Electronically to        [facility] (retail)       [address]       [place], [place]  [id number]       Ph: [phone]       Fax: [phone]   RxID:   [id number] ADVAIR DISKUS 100-50 MCG/DOSE MISC (FLUTICASONE-SALMETEROL) Inhale 1 puff as directed twice a day  #1 x prn   Entered and Authorized by:   Waymon Budge MD   Signed by:   Waymon Budge MD on 03/22/2010   Method used:   Electronically to        [facility] (retail)       [address]       [place], [place]  [id number]       Ph: [phone]       Fax: [phone]   RxID:   [id number] PROAIR HFA 108 (90 BASE) MCG/ACT AERS (ALBUTEROL SULFATE) 2 puffs four times a day as needed rescue inhaler  #1 x prn   Entered and Authorized by:   Waymon Budge MD   Signed by:   Waymon Budge MD on 03/22/2010   Method used:   Electronically to        [facility]  (retail)       [address]       [place], [place]  [id number]       Ph: [phone]       Fax: [phone]   RxID:   [id number]

## 2010-08-31 ENCOUNTER — Other Ambulatory Visit: Payer: Self-pay | Admitting: Podiatry

## 2010-09-07 ENCOUNTER — Other Ambulatory Visit: Payer: Self-pay | Admitting: Podiatry

## 2010-09-21 ENCOUNTER — Encounter: Payer: Self-pay | Admitting: Internal Medicine

## 2010-10-11 NOTE — Letter (Signed)
Summary: [facility]   Imported By: Sherian Rein 10/04/2010 12:50:07  _____________________________________________________________________  External Attachment:    Type:   Image     Comment:   External Document

## 2010-10-17 LAB — DIFFERENTIAL
Basophils Absolute: 0.1 10*3/uL (ref 0.0–0.1)
Basophils Relative: 1 % (ref 0–1)
Eosinophils Absolute: 0.1 10*3/uL (ref 0.0–0.7)
Eosinophils Absolute: 0 10*3/uL (ref 0.0–0.7)
Eosinophils Relative: 1 % (ref 0–5)
Lymphocytes Relative: 23 % (ref 12–46)
Lymphs Abs: 1.5 10*3/uL (ref 0.7–4.0)
Lymphs Abs: 1.5 10*3/uL (ref 0.7–4.0)
Monocytes Absolute: 0.5 10*3/uL (ref 0.1–1.0)
Monocytes Absolute: 0.6 10*3/uL (ref 0.1–1.0)
Monocytes Relative: 5 % (ref 3–12)
Neutro Abs: 4.5 10*3/uL (ref 1.7–7.7)
Neutro Abs: 7.4 10*3/uL (ref 1.7–7.7)
Neutrophils Relative %: 80 % — ABNORMAL HIGH (ref 43–77)

## 2010-10-17 LAB — PREPARE FRESH FROZEN PLASMA

## 2010-10-17 LAB — COMPREHENSIVE METABOLIC PANEL
ALT: 12 U/L (ref 0–35)
ALT: 18 U/L (ref 0–35)
AST: 21 U/L (ref 0–37)
Albumin: 2.8 g/dL — ABNORMAL LOW (ref 3.5–5.2)
Alkaline Phosphatase: 46 U/L (ref 39–117)
BUN: 10 mg/dL (ref 6–23)
CO2: 26 mEq/L (ref 19–32)
CO2: 31 mEq/L (ref 19–32)
Calcium: 8.4 mg/dL (ref 8.4–10.5)
Chloride: 96 mEq/L (ref 96–112)
Creatinine, Ser: 0.57 mg/dL (ref 0.4–1.2)
GFR calc Af Amer: >60 mL/min (ref >60)
GFR calc non Af Amer: >60 mL/min (ref >60)
Glucose, Bld: 166 mg/dL — ABNORMAL HIGH (ref 70–99)
Potassium: 2.8 mEq/L — ABNORMAL LOW (ref 3.5–5.1)
Sodium: 139 mEq/L (ref 135–145)
Sodium: 136 mEq/L (ref 135–145)
Total Bilirubin: 0.7 mg/dL (ref 0.3–1.2)
Total Protein: 5.6 g/dL — ABNORMAL LOW (ref 6.0–8.3)

## 2010-10-17 LAB — CLOSTRIDIUM DIFFICILE EIA: C difficile Toxins A+B, EIA: 28

## 2010-10-17 LAB — CBC
HCT: 31.1 % — ABNORMAL LOW (ref 36.0–46.0)
HCT: 16 % — ABNORMAL LOW (ref 36.0–46.0)
HCT: 26.4 % — ABNORMAL LOW (ref 36.0–46.0)
HCT: 27.6 % — ABNORMAL LOW (ref 36.0–46.0)
HCT: 28.2 % — ABNORMAL LOW (ref 36.0–46.0)
HCT: 31.5 % — ABNORMAL LOW (ref 36.0–46.0)
Hemoglobin: 9 g/dL — ABNORMAL LOW (ref 12.0–15.0)
Hemoglobin: 5.4 g/dL — CL (ref 12.0–15.0)
Hemoglobin: 8.9 g/dL — ABNORMAL LOW (ref 12.0–15.0)
Hemoglobin: 9.3 g/dL — ABNORMAL LOW (ref 12.0–15.0)
Hemoglobin: 9.4 g/dL — ABNORMAL LOW (ref 12.0–15.0)
Hemoglobin: 9.5 g/dL — ABNORMAL LOW (ref 12.0–15.0)
MCHC: 32.5 g/dL (ref 30.0–36.0)
MCHC: 33.6 g/dL (ref 30.0–36.0)
MCHC: 33.7 g/dL (ref 30.0–36.0)
MCHC: 33.8 g/dL (ref 30.0–36.0)
MCHC: 34.1 g/dL (ref 30.0–36.0)
MCV: 90.8 fL (ref 78.0–100.0)
MCV: 86.8 fL (ref 78.0–100.0)
MCV: 87.2 fL (ref 78.0–100.0)
MCV: 87.7 fL (ref 78.0–100.0)
Platelets: 160 10*3/uL (ref 150–400)
Platelets: 167 10*3/uL (ref 150–400)
Platelets: 197 10*3/uL (ref 150–400)
Platelets: 214 10*3/uL (ref 150–400)
Platelets: 219 10*3/uL (ref 150–400)
Platelets: 225 10*3/uL (ref 150–400)
Platelets: 242 10*3/uL (ref 150–400)
Platelets: ADEQUATE 10*3/uL (ref 150–400)
RBC: 1.89 MIL/uL — ABNORMAL LOW (ref 3.87–5.11)
RBC: 3.01 MIL/uL — ABNORMAL LOW (ref 3.87–5.11)
RBC: 3.14 MIL/uL — ABNORMAL LOW (ref 3.87–5.11)
RBC: 3.2 MIL/uL — ABNORMAL LOW (ref 3.87–5.11)
RBC: 3.62 MIL/uL — ABNORMAL LOW (ref 3.87–5.11)
RDW: 21 % — ABNORMAL HIGH (ref 11.5–15.5)
RDW: 17.1 % — ABNORMAL HIGH (ref 11.5–15.5)
RDW: 17.3 % — ABNORMAL HIGH (ref 11.5–15.5)
RDW: 17.5 % — ABNORMAL HIGH (ref 11.5–15.5)
RDW: 17.6 % — ABNORMAL HIGH (ref 11.5–15.5)
RDW: 17.6 % — ABNORMAL HIGH (ref 11.5–15.5)
RDW: 18.6 % — ABNORMAL HIGH (ref 11.5–15.5)
WBC: 7.5 10*3/uL (ref 4.0–10.5)
WBC: 6.7 10*3/uL (ref 4.0–10.5)
WBC: 7.4 10*3/uL (ref 4.0–10.5)
WBC: 9.2 10*3/uL (ref 4.0–10.5)
WBC: 9.3 10*3/uL (ref 4.0–10.5)

## 2010-10-17 LAB — URINE MICROSCOPIC-ADD ON

## 2010-10-17 LAB — ABO/RH: ABO/RH(D): A POS

## 2010-10-17 LAB — BRAIN NATRIURETIC PEPTIDE: Pro B Natriuretic peptide (BNP): 51.8 pg/mL (ref 0.0–100.0)

## 2010-10-17 LAB — PROTIME-INR
INR: 1.32 (ref 0.00–1.49)
INR: 1.48 (ref 0.00–1.49)
Prothrombin Time: 16.4 seconds — ABNORMAL HIGH (ref 11.6–15.2)
Prothrombin Time: 17.3 seconds — ABNORMAL HIGH (ref 11.6–15.2)
Prothrombin Time: 15.1 seconds (ref 11.6–15.2)
Prothrombin Time: 16.3 seconds — ABNORMAL HIGH (ref 11.6–15.2)

## 2010-10-17 LAB — BASIC METABOLIC PANEL
BUN: 7 mg/dL (ref 6–23)
BUN: 5 mg/dL — ABNORMAL LOW (ref 6–23)
BUN: 6 mg/dL (ref 6–23)
CO2: 28 mEq/L (ref 19–32)
CO2: 29 mEq/L (ref 19–32)
CO2: 30 mEq/L (ref 19–32)
Calcium: 7.9 mg/dL — ABNORMAL LOW (ref 8.4–10.5)
Calcium: 8.1 mg/dL — ABNORMAL LOW (ref 8.4–10.5)
Chloride: 107 mEq/L (ref 96–112)
Chloride: 102 mEq/L (ref 96–112)
Chloride: 104 mEq/L (ref 96–112)
Creatinine, Ser: 0.67 mg/dL (ref 0.4–1.2)
Creatinine, Ser: 0.71 mg/dL (ref 0.4–1.2)
GFR calc Af Amer: >60 mL/min (ref >60)
GFR calc Af Amer: >60 mL/min (ref >60)
Glucose, Bld: 121 mg/dL — ABNORMAL HIGH (ref 70–99)
Glucose, Bld: 124 mg/dL — ABNORMAL HIGH (ref 70–99)
Potassium: 3.6 mEq/L (ref 3.5–5.1)
Potassium: 3.9 mEq/L (ref 3.5–5.1)
Sodium: 138 mEq/L (ref 135–145)

## 2010-10-17 LAB — URINALYSIS, ROUTINE W REFLEX MICROSCOPIC
Glucose, UA: NEGATIVE mg/dL
Ketones, ur: 15 mg/dL — AB
pH: 8.5 — ABNORMAL HIGH (ref 5.0–8.0)

## 2010-10-17 LAB — APTT
aPTT: 28 seconds (ref 24–37)
aPTT: 34 seconds (ref 24–37)
aPTT: 27 seconds (ref 24–37)

## 2010-10-17 LAB — CARDIAC PANEL(CRET KIN+CKTOT+MB+TROPI)
CK, MB: 0.2 ng/mL — ABNORMAL LOW (ref 0.3–4.0)
Relative Index: INVALID (ref 0.0–2.5)
Total CK: 22 U/L (ref 7–177)
Total CK: 33 U/L (ref 7–177)
Troponin I: 0.01 ng/mL (ref 0.00–0.06)

## 2010-10-17 LAB — TSH: TSH: 1.921 u[IU]/mL (ref 0.350–4.500)

## 2010-10-17 LAB — TYPE AND SCREEN

## 2010-10-17 LAB — MAGNESIUM: Magnesium: 2.2 mg/dL (ref 1.5–2.5)

## 2010-10-17 LAB — PREPARE RBC (CROSSMATCH)

## 2010-10-19 LAB — CBC
Hemoglobin: 9.9 g/dL — ABNORMAL LOW (ref 12.0–15.0)
MCHC: 32.8 g/dL (ref 30.0–36.0)
MCHC: 32.9 g/dL (ref 30.0–36.0)
Platelets: 162 10*3/uL (ref 150–400)
RBC: 3.89 MIL/uL (ref 3.87–5.11)
RDW: 16.4 % — ABNORMAL HIGH (ref 11.5–15.5)
WBC: 9.9 10*3/uL (ref 4.0–10.5)

## 2010-10-19 LAB — GLUCOSE, CAPILLARY
Glucose-Capillary: 133 mg/dL — ABNORMAL HIGH (ref 70–99)
Glucose-Capillary: 137 mg/dL — ABNORMAL HIGH (ref 70–99)
Glucose-Capillary: 143 mg/dL — ABNORMAL HIGH (ref 70–99)
Glucose-Capillary: 156 mg/dL — ABNORMAL HIGH (ref 70–99)
Glucose-Capillary: 158 mg/dL — ABNORMAL HIGH (ref 70–99)
Glucose-Capillary: 158 mg/dL — ABNORMAL HIGH (ref 70–99)
Glucose-Capillary: 163 mg/dL — ABNORMAL HIGH (ref 70–99)
Glucose-Capillary: 184 mg/dL — ABNORMAL HIGH (ref 70–99)
Glucose-Capillary: 195 mg/dL — ABNORMAL HIGH (ref 70–99)
Glucose-Capillary: 88 mg/dL (ref 70–99)

## 2010-10-19 LAB — BASIC METABOLIC PANEL
CO2: 22 mEq/L (ref 19–32)
Calcium: 8.4 mg/dL (ref 8.4–10.5)
GFR calc Af Amer: >60 mL/min (ref >60)
Potassium: 3.5 mEq/L (ref 3.5–5.1)
Sodium: 139 mEq/L (ref 135–145)

## 2010-10-19 LAB — DIFFERENTIAL
Basophils Absolute: 0 10*3/uL (ref 0.0–0.1)
Basophils Relative: 0 % (ref 0–1)
Basophils Relative: 1 % (ref 0–1)
Eosinophils Relative: 0 % (ref 0–5)
Lymphs Abs: 1.5 10*3/uL (ref 0.7–4.0)
Monocytes Absolute: 0.3 10*3/uL (ref 0.1–1.0)
Monocytes Relative: 5 % (ref 3–12)
Neutro Abs: 7.8 10*3/uL — ABNORMAL HIGH (ref 1.7–7.7)
Neutrophils Relative %: 79 % — ABNORMAL HIGH (ref 43–77)

## 2010-10-19 LAB — HEMOGLOBIN AND HEMATOCRIT, BLOOD
HCT: 34 % — ABNORMAL LOW (ref 36.0–46.0)
Hemoglobin: 11 g/dL — ABNORMAL LOW (ref 12.0–15.0)

## 2010-10-24 LAB — COMPREHENSIVE METABOLIC PANEL
ALT: 18 U/L (ref 0–35)
ALT: 18 U/L (ref 0–35)
AST: 22 U/L (ref 0–37)
Albumin: 3.9 g/dL (ref 3.5–5.2)
Alkaline Phosphatase: 74 U/L (ref 39–117)
Alkaline Phosphatase: 82 U/L (ref 39–117)
BUN: 12 mg/dL (ref 6–23)
CO2: 27 mEq/L (ref 19–32)
Calcium: 9.4 mg/dL (ref 8.4–10.5)
Calcium: 9.8 mg/dL (ref 8.4–10.5)
GFR calc Af Amer: >60 mL/min (ref >60)
GFR calc non Af Amer: >60 mL/min (ref >60)
Glucose, Bld: 93 mg/dL (ref 70–99)
Glucose, Bld: 98 mg/dL (ref 70–99)
Potassium: 3.7 mEq/L (ref 3.5–5.1)
Potassium: 4.6 mEq/L (ref 3.5–5.1)
Sodium: 140 mEq/L (ref 135–145)
Total Protein: 7.2 g/dL (ref 6.0–8.3)
Total Protein: 7.7 g/dL (ref 6.0–8.3)

## 2010-10-24 LAB — DIFFERENTIAL
Basophils Relative: 0 % (ref 0–1)
Eosinophils Absolute: 0 10*3/uL (ref 0.0–0.7)
Lymphs Abs: 1.4 10*3/uL (ref 0.7–4.0)
Monocytes Absolute: 0.3 10*3/uL (ref 0.1–1.0)
Monocytes Relative: 5 % (ref 3–12)

## 2010-10-24 LAB — CBC
HCT: 35.7 % — ABNORMAL LOW (ref 36.0–46.0)
Hemoglobin: 11.8 g/dL — ABNORMAL LOW (ref 12.0–15.0)
Hemoglobin: 12.3 g/dL (ref 12.0–15.0)
MCHC: 33 g/dL (ref 30.0–36.0)
Platelets: 215 10*3/uL (ref 150–400)
RBC: 4.19 MIL/uL (ref 3.87–5.11)
RDW: 16 % — ABNORMAL HIGH (ref 11.5–15.5)
RDW: 16.2 % — ABNORMAL HIGH (ref 11.5–15.5)

## 2010-10-24 LAB — PROTIME-INR
INR: 2.33 — ABNORMAL HIGH (ref 0.00–1.49)
Prothrombin Time: 25.4 seconds — ABNORMAL HIGH (ref 11.6–15.2)

## 2010-10-27 ENCOUNTER — Ambulatory Visit (INDEPENDENT_AMBULATORY_CARE_PROVIDER_SITE_OTHER): Payer: Medicare Other | Admitting: Internal Medicine

## 2010-10-27 ENCOUNTER — Encounter: Payer: Self-pay | Admitting: Internal Medicine

## 2010-10-27 VITALS — BP 118/70 | HR 83 | Temp 98.1°F | Ht 70.0 in | Wt 220.0 lb

## 2010-10-27 DIAGNOSIS — M545 Low back pain: Secondary | ICD-10-CM

## 2010-10-27 DIAGNOSIS — E039 Hypothyroidism, unspecified: Secondary | ICD-10-CM

## 2010-10-27 DIAGNOSIS — R209 Unspecified disturbances of skin sensation: Secondary | ICD-10-CM

## 2010-10-27 DIAGNOSIS — R202 Paresthesia of skin: Secondary | ICD-10-CM

## 2010-10-27 NOTE — Assessment & Plan Note (Signed)
Will schedule NCS and EMG to look for neuropathy, she may need a sleep study to look for RLS, I advised her to increase her neurontin as she says that it helps her symptoms

## 2010-10-27 NOTE — Progress Notes (Signed)
Subjective:    Patient ID: Kelli Martinez, female    DOB: Nov 08, 1945, 65 y.o.   MRN: [id number]  HPI New to me she tells me that she has had trouble with her left leg for 6 months, she has numbness and tingling and says the left leg will not stay still. She saw Dr. Ethelene Hal about 3 months ago for an epidural in her low back and she says that he told her the symptoms were not coming from her low back. She does not have a bed partner and therefore does not know if her legs move at night. She tells me that neurontin helps her symptoms. She has had left leg swelling in the past from surgery and a DVT but she has not had any swelling recently. She does not have claudication.   Review of Systems  Constitutional: Negative for fever, chills, diaphoresis, activity change, appetite change, fatigue and unexpected weight change.  Respiratory: Negative for apnea, cough, choking, chest tightness, shortness of breath, wheezing and stridor.   Cardiovascular: Negative for chest pain and leg swelling.  Gastrointestinal: Negative for nausea, abdominal pain, diarrhea, constipation and blood in stool.  Genitourinary: Negative for dysuria, urgency, frequency and pelvic pain.  Musculoskeletal: Positive for back pain. Negative for myalgias, joint swelling, arthralgias and gait problem.  Skin: Negative for color change, pallor, rash and wound.  Neurological: Positive for numbness. Negative for dizziness, tremors, seizures, syncope, facial asymmetry, speech difficulty, weakness, light-headedness and headaches.  Hematological: Negative for adenopathy. Does not bruise/bleed easily.  Psychiatric/Behavioral: Negative for hallucinations, behavioral problems, confusion, self-injury, dysphoric mood, decreased concentration and agitation. The patient is not nervous/anxious.        Objective:   Physical Exam  Constitutional: She is oriented to person, place, and time. She appears well-developed and well-nourished. No  distress.  HENT:  Head: Normocephalic and atraumatic.  Right Ear: External ear normal.  Left Ear: External ear normal.  Nose: Nose normal.  Mouth/Throat: No oropharyngeal exudate.  Eyes: Conjunctivae and EOM are normal. Pupils are equal, round, and reactive to light. Right eye exhibits no discharge. Left eye exhibits no discharge. No scleral icterus.  Neck: Normal range of motion. Neck supple. No thyromegaly present.  Cardiovascular: Normal rate, regular rhythm, normal heart sounds and intact distal pulses.  Exam reveals no gallop and no friction rub.   No murmur heard. Pulmonary/Chest: Effort normal and breath sounds normal. No respiratory distress. She has no wheezes. She has no rales. She exhibits no tenderness.  Abdominal: Soft. Bowel sounds are normal. She exhibits no distension. There is no tenderness. There is no rebound and no guarding.  Musculoskeletal: Normal range of motion. She exhibits no edema and no tenderness.       Right lower leg: She exhibits no tenderness, no bony tenderness, no swelling, no edema and no deformity.       Left lower leg: She exhibits no tenderness, no bony tenderness, no swelling and no edema.  Lymphadenopathy:    She has no cervical adenopathy.  Neurological: She is oriented to person, place, and time. She displays no atrophy, no tremor and normal reflexes. No cranial nerve deficit or sensory deficit. She exhibits normal muscle tone. She displays no seizure activity. Coordination and gait normal.  Reflex Scores:      Tricep reflexes are 1+ on the right side and 1+ on the left side.      Bicep reflexes are 1+ on the right side and 1+ on the left side.  Brachioradialis reflexes are 1+ on the right side and 1+ on the left side.      Patellar reflexes are 1+ on the right side and 1+ on the left side.      Achilles reflexes are 1+ on the right side and 1+ on the left side. Skin: Skin is warm and dry. No rash noted. She is not diaphoretic. No erythema.    Psychiatric: She has a normal mood and affect. Her behavior is normal. Judgment and thought content normal.          Assessment & Plan:

## 2010-10-27 NOTE — Assessment & Plan Note (Signed)
She will continue to f/up with Dr. Ethelene Hal

## 2010-10-27 NOTE — Patient Instructions (Signed)
Back Pain & Injury Your back pain is most likely caused by a strain of the muscles or ligaments supporting the spine. Back strains cause pain and trouble moving because of muscle spasms. They may take several weeks to heal. Usually they are better in days.  Treatment for back pain includes:  Rest - Get bed rest as needed over the next day or two. Use a firm mattress and lie on your side with your knees slightly bent. If you lie on your back, put a pillow under your knees.   Early movement - Back pain improves most rapidly if you remain active. It is much more stressful on the back to sit or stand in one place. Do not sit, drive or stand in one place for more than 30 minutes at a time. Take short walks on level surfaces as soon as pain allows.   Limit bending and lifting - Do not bend over or lift anything over 20 pounds until instructed otherwise. Lift by bending your knees. Use your leg muscles to help. Keep the load close to your body and avoid twisting. Do not reach or do overhead work.   Medicines - Medicine to reduce pain and inflammation are helpful. Muscle-relaxing drugs may be prescribed.   Therapy - Put ice packs on your back every few hours for the first 2-3 days after your injury or as instructed. After that ice or heat may be alternated to reduce pain and spasm. Back exercises and gentle massage may be of some benefit. You should be examined again if your back pain is not better in one week.  SEEK IMMEDIATE MEDICAL CARE IF:  You have pain that radiates from your back into your legs.   You develop new bowel or bladder control problems.   You have unusual weakness or numbness in your arms or legs.   You develop nausea or vomiting.   You develop abdominal pain.   You feel faint.  Document Released: 07/17/2005 Document Re-Released: 04/25/2008 [employer] Patient Information 2011 [employer]. 

## 2010-10-27 NOTE — Assessment & Plan Note (Signed)
I did not do any labs on her today, she tells me that Dr. Lucianne Muss did labs about one month ago and that she is seeing him in a few weeks to do labs again

## 2010-11-01 ENCOUNTER — Other Ambulatory Visit: Payer: Self-pay | Admitting: Internal Medicine

## 2010-11-01 DIAGNOSIS — Z1231 Encounter for screening mammogram for malignant neoplasm of breast: Secondary | ICD-10-CM

## 2010-11-03 ENCOUNTER — Encounter (HOSPITAL_BASED_OUTPATIENT_CLINIC_OR_DEPARTMENT_OTHER): Payer: Medicare Other | Admitting: Oncology

## 2010-11-03 ENCOUNTER — Other Ambulatory Visit: Payer: Self-pay | Admitting: Oncology

## 2010-11-03 DIAGNOSIS — Z853 Personal history of malignant neoplasm of breast: Secondary | ICD-10-CM

## 2010-11-03 DIAGNOSIS — Z86718 Personal history of other venous thrombosis and embolism: Secondary | ICD-10-CM

## 2010-11-03 DIAGNOSIS — D638 Anemia in other chronic diseases classified elsewhere: Secondary | ICD-10-CM

## 2010-11-03 DIAGNOSIS — I890 Lymphedema, not elsewhere classified: Secondary | ICD-10-CM

## 2010-11-03 DIAGNOSIS — D509 Iron deficiency anemia, unspecified: Secondary | ICD-10-CM

## 2010-11-03 DIAGNOSIS — D649 Anemia, unspecified: Secondary | ICD-10-CM

## 2010-11-03 LAB — CBC WITH DIFFERENTIAL/PLATELET
BASO%: 1.6 % (ref 0.0–2.0)
Eosinophils Absolute: 0.1 10*3/uL (ref 0.0–0.5)
HCT: 37.9 % (ref 34.8–46.6)
LYMPH%: 44.5 % (ref 14.0–49.7)
MCHC: 32.5 g/dL (ref 31.5–36.0)
MCV: 86.3 fL (ref 79.5–101.0)
MONO%: 4.9 % (ref 0.0–14.0)
NEUT%: 47.6 % (ref 38.4–76.8)
Platelets: 202 10*3/uL (ref 145–400)
RBC: 4.39 10*6/uL (ref 3.70–5.45)

## 2010-11-03 LAB — COMPREHENSIVE METABOLIC PANEL
AST: 38 U/L — ABNORMAL HIGH (ref 0–37)
Alkaline Phosphatase: 87 U/L (ref 39–117)
BUN: 16 mg/dL (ref 6–23)
Calcium: 9.7 mg/dL (ref 8.4–10.5)
Chloride: 104 mEq/L (ref 96–112)
Creatinine, Ser: 0.87 mg/dL (ref 0.40–1.20)

## 2010-11-03 LAB — IRON AND TIBC
TIBC: 317 ug/dL (ref 250–470)
UIBC: 206 ug/dL

## 2010-11-06 LAB — GLUCOSE, CAPILLARY: Glucose-Capillary: 106 mg/dL — ABNORMAL HIGH (ref 70–99)

## 2010-11-06 LAB — DIFFERENTIAL
Eosinophils Relative: 0 % (ref 0–5)
Lymphocytes Relative: 12 % (ref 12–46)
Monocytes Absolute: 0.4 10*3/uL (ref 0.1–1.0)
Monocytes Relative: 4 % (ref 3–12)
Neutro Abs: 7 10*3/uL (ref 1.7–7.7)

## 2010-11-06 LAB — POCT CARDIAC MARKERS: Troponin i, poc: <0.05 ng/mL (ref 0.00–0.09)

## 2010-11-06 LAB — URINE MICROSCOPIC-ADD ON

## 2010-11-06 LAB — CBC
HCT: 34.7 % — ABNORMAL LOW (ref 36.0–46.0)
Hemoglobin: 11.3 g/dL — ABNORMAL LOW (ref 12.0–15.0)
MCHC: 32.4 g/dL (ref 30.0–36.0)
RBC: 4.08 MIL/uL (ref 3.87–5.11)
RDW: 17.3 % — ABNORMAL HIGH (ref 11.5–15.5)

## 2010-11-06 LAB — URINALYSIS, ROUTINE W REFLEX MICROSCOPIC
Glucose, UA: NEGATIVE mg/dL
Protein, ur: NEGATIVE mg/dL
Specific Gravity, Urine: 1.011 (ref 1.005–1.030)

## 2010-11-06 LAB — POCT I-STAT, CHEM 8
BUN: 18 mg/dL (ref 6–23)
Calcium, Ion: 1.05 mmol/L — ABNORMAL LOW (ref 1.12–1.32)
Chloride: 103 mEq/L (ref 96–112)
Glucose, Bld: 97 mg/dL (ref 70–99)
TCO2: 27 mmol/L (ref 0–100)

## 2010-11-17 ENCOUNTER — Ambulatory Visit: Payer: Self-pay | Admitting: Cardiology

## 2010-12-03 ENCOUNTER — Inpatient Hospital Stay (INDEPENDENT_AMBULATORY_CARE_PROVIDER_SITE_OTHER)
Admission: RE | Admit: 2010-12-03 | Discharge: 2010-12-03 | Disposition: A | Discharge status: Home / Self Care | Payer: Medicare Other | Source: Ambulatory Visit | Attending: Family Medicine | Admitting: Family Medicine

## 2010-12-03 DIAGNOSIS — T148XXA Other injury of unspecified body region, initial encounter: Secondary | ICD-10-CM

## 2010-12-05 ENCOUNTER — Ambulatory Visit
Admission: RE | Admit: 2010-12-05 | Discharge: 2010-12-05 | Disposition: A | Discharge status: Home / Self Care | Payer: Medicare Other | Source: Ambulatory Visit | Attending: Internal Medicine | Admitting: Internal Medicine

## 2010-12-05 ENCOUNTER — Other Ambulatory Visit: Payer: Self-pay | Admitting: Internal Medicine

## 2010-12-05 DIAGNOSIS — Z1231 Encounter for screening mammogram for malignant neoplasm of breast: Secondary | ICD-10-CM

## 2010-12-13 NOTE — Procedures (Signed)
NAME:  Kelli Martinez, Kelli Martinez           ACCOUNT NO.:  [id number]   MEDICAL RECORD NO.:  [id number]          PATIENT TYPE:  OUT   LOCATION:  SLEEP CENTER                 FACILITY[facility]  [facility]   PHYSICIAN:  Clinton D. Maple Hudson, MD, FCCP, FACPDATE OF BIRTH:  02-Aug-1947   DATE OF STUDY:  01/02/2007                            NOCTURNAL POLYSOMNOGRAM   REFERRING PHYSICIAN:  Rosine Abe, M.D.   INDICATION FOR STUDY:  Insomnia with sleep apnea.   EPWORTH SLEEPINESS SCORE:  2/24, BMI 47, weight 320 pounds.   MEDICATIONS:  Is listed and reviewed.   SLEEP ARCHITECTURE:  Total sleep time short, 270 minutes, with sleep  efficiency 69%.  Stage 1 was 10%, stage 2 79%, stages III and IV absent,  REM 11% of total sleep time.  Sleep latency 16 minutes, REM latency 276  minutes, awake after sleep onset 98 minutes, arousal index 23.7.   MEDICATIONS TAKEN AT BEDTIME:  Actos, magnesium, Darvocet, gabapentin,  and Diovan HCT.   RESPIRATORY DATA:  Split study protocol.  Apnea-hypopnea index (AHI,  RDI) 17.3 obstructive events per hour, indicating moderate obstructive  sleep apnea/hypopnea syndrome before CPAP.  There were 14 obstructive  apneas and 23 hypopneas before CPAP.  Most events were recorded while  sleeping supine.  REM AHI 0.  CPAP was titrated to 11 CWP, AHI 0 per  hour.  A medium Mirage Quattro mask was used with heated humidifier.   OXYGEN DATA:  Moderate snoring with oxygen desaturation to a nadir of  90%.  Mean oxygen saturation with CPAP control was 96% on room air.   CARDIAC DATA:  Normal sinus rhythm.   MOVEMENT-PARASOMNIA:  No significant movement disturbance.  Bathroom x3  did interrupt sleep.   IMPRESSIONS-RECOMMENDATIONS:  1. Moderate obstructive sleep apnea/hypopnea syndrome, apnea-hypopnea      index 17.3 per hour, with positional events most frequent while      supine, moderate snoring and oxygen desaturation to a nadir of 90%.  2. Successful continuous positive airway pressure  titration to 11 CWP,      apnea-hypopnea index 0 per hour.  A      medium Mirage Quattro mask was used with heated humidifier.  3. Bathroom times three.      Clinton D. Maple Hudson, MD, [facility], FACP  Diplomate, Biomedical engineer of Sleep Medicine  Electronically Signed     CDY/MEDQ  D:  01/06/2007 10:27:07  T:  01/06/2007 17:53:10  Job:  [id number]

## 2010-12-13 NOTE — Procedures (Signed)
DUPLEX DEEP VENOUS EXAM - LOWER EXTREMITY   INDICATION:  Left calf pain and swelling.   HISTORY:  Edema:  Yes.  Trauma/Surgery:  No.  Pain:  Yes.  PE:  Yes.  Previous DVT:  Yes.  Anticoagulants:  Yes (Coumadin).  Other:   DUPLEX EXAM:                CFV   SFV   PopV  PTV    GSV                R  L  R  L  R  L  R   L  R  L  Thrombosis    0  0     0     0      0     0  Spontaneous   +  +     +     +      +     +  Phasic        +  +     +     +      +     +  Augmentation  +  +     +     +      +     +  Compressible  +  +     +     +      +     +  Competent     +  +     +     +      +     +   Legend:  + - yes  o - no  p - partial  D - decreased   IMPRESSION:  No evidence of deep venous thrombosis noted in left leg.   Notify Crysta with the results.    _____________________________  Janetta Hora Fields, MD   MG/MEDQ  D:  08/26/2007  T:  08/27/2007  Job:  [id number]

## 2010-12-16 NOTE — Assessment & Plan Note (Signed)
[facility]                             PULMONARY OFFICE NOTE   NAME:TISDALEDennie, Kelli Martinez                  MRN:          [id number]  DATE:11/13/2006                            DOB:          1946/03/25    PROBLEMS:  1. Allergic rhinitis.  2. Asthma.  3. History of breast cancer.  4. History of pulmonary embolism/chronic Coumadin/anemia.   HISTORY:  She is doing pretty well now and expects that she will do well  through the summer, now that she is made it this far.  Occasional Visine  for her eyes, stable exertional dyspnea, does not try to do very much  but no sudden events.   MEDICATIONS:  1. Advair 100/50.  2. Flonase.  3. Coumadin.  4. Metformin 500 mg a.m., 1000 mg p.m.  5. Diovan 160 mg.  6. Actos 30 mg.  7. Quinine 325 mg.  8. Lipitor 40 mg.  9. Magnesium the 4 mg.  10.Iron.  11.Glimepiride 2 mg.  12.Levothyroxine 50 mg.  13.Gabapentin.  14.Darvocet.   MEDICATIONS:  No medication allergies.   OBJECTIVE:  Weight 324 pounds.  BP 122/64, pulse regular 85, room air  saturation 99%.  There are a few scattered chest crackles without dullness or increased  work of breathing.  Heart sounds are regular without murmur heard.  Eyes and nose are both a little watery, but not injected.  Pharynx is  clear.   IMPRESSION:  1. Mild rhinitis and conjunctivitis, seasonal allergic, off vaccine.  2. Dyspnea is multifactorial reflecting obesity and deconditioning, as      well as her previous history of pulmonary embolism and her anemia.   PLAN:  Walk for endurance and weight control as tolerated.  Over-the-  counter antihistamine p.r.n. with discussion.  Scheduled to return in  six months, but earlier p.r.n.     Clinton D. Maple Hudson, MD, Tonny Bollman, FACP  Electronically Signed    CDY/MedQ  DD: 11/13/2006  DT: 11/14/2006  Job #: [id number]   cc:   [name], M.D.  Blenda Nicely. [employer]

## 2010-12-16 NOTE — Op Note (Signed)
NAME:  Kelli Martinez, Kelli Martinez                     ACCOUNT NO.:  [id number]   MEDICAL RECORD NO.:  [id number]                   PATIENT TYPE:  OIB   LOCATION:  2550                                 FACILITY:  [facility]   PHYSICIAN:  Beulah Gandy. Ashley Royalty, M.D.              DATE OF BIRTH:  11-18-1945   DATE OF PROCEDURE:  05/28/2003  DATE OF DISCHARGE:                                 OPERATIVE REPORT   ADMISSION DIAGNOSIS:  Traction retinal detachment and peripheral retinal  breaks in the left eye.   PROCEDURES:  Pars plana vitrectomy with membrane peel, retinal  photocoagulation, endocautery, and gas/fluid exchange in the left eye.   SURGEON:  Beulah Gandy. Ashley Royalty, M.D.   ASSISTANT:  _____________   ANESTHESIA:  General.   DESCRIPTION OF PROCEDURE:  The usual prep and drape.  Peritomies at 10, 2,  and 4 o'clock.  The 4 mm infusion port was anchored into place at 4 o'clock.  The lighted pick and the cutter were placed at 10 and 2 o'clock,  respectively.  Provisc was placed on the corneal surface.  The Bion was  moved into place.  The pars plana vitrectomy was begun just behind the  crystalline lens where white vitreous material was encountered.  This was  carefully removed under low suction and rapid cutting.  The vitrectomy was  carried out into the peripheral vitreous area down to the vitreous base.  Vitreous was attached to the break at 6 o'clock and this was carefully  removed with the vitreous cutter and trimmed down to the retinal surface.  The vitreous was attached to the lower and upper arcade temporally, as well  as nasally.  Vitreous forceps were used to grasp the stump of the area of  traction and lifted free from its attachments to the lower and upper arcade.  The membrane was peeled nasally and several small retinal breaks were  uncovered.  The endocautery was used to cauterize vascularization in the  vitreous scar.  This was then carefully trimmed with the vitreous cutter  down to  the retinal surface.  The indirect ophthalmoscope laser was moved  into place and 948 burns placed around the retinal periphery with a power of  800 milliwatts, 1000 microns each and 0.1 seconds each.  The endolaser was  placed nasally in the posterior pole around the areas of retinal breaks.  A  total gas/fluid exchange was then carried out.  Perfluoropropane 14% was  exchanged for intravitreal gas.  The instruments were removed from the eye  and 9-0 nylon was used to close the sclerotomy sites.  Weck-cel sponge test  found tight wounds.  The conjunctiva was closed with wet-field cautery.  Polymixin and gentamicin were irrigated into Tenon's space.  Atropine  solution was applied.  Decadron 10 mg was injected into the lower  subconjunctival space.  The closing tension was 15 with a Barraquer  tonometer.  Complications:  None.  Duration:  One hour.  Polysporin, a  patch, and shield were placed.  The patient was taken to recovery in  satisfactory condition.                                                Beulah Gandy. Ashley Royalty, M.D.    JDM/MEDQ  D:  05/28/2003  T:  05/28/2003  Job:  [id number]

## 2011-01-20 ENCOUNTER — Telehealth: Payer: Self-pay | Admitting: Internal Medicine

## 2011-01-20 MED ORDER — ALBUTEROL SULFATE HFA 108 (90 BASE) MCG/ACT IN AERS: 2.0000 | INHALATION_SPRAY | Freq: Four times a day (QID) | RESPIRATORY_TRACT | Status: DC | PRN

## 2011-01-20 NOTE — Telephone Encounter (Signed)
Rx sent to [facility] and left message at given number that phone message has been taken care of.

## 2011-01-26 ENCOUNTER — Other Ambulatory Visit: Payer: Self-pay | Admitting: *Deleted

## 2011-01-26 MED ORDER — FLUTICASONE PROPIONATE 50 MCG/ACT NA SUSP: 2.0000 | Freq: Every day | NASAL | Status: DC

## 2011-01-30 ENCOUNTER — Other Ambulatory Visit: Payer: Self-pay | Admitting: Oncology

## 2011-01-30 ENCOUNTER — Encounter (HOSPITAL_BASED_OUTPATIENT_CLINIC_OR_DEPARTMENT_OTHER): Payer: Medicare Other | Admitting: Oncology

## 2011-01-30 DIAGNOSIS — Z853 Personal history of malignant neoplasm of breast: Secondary | ICD-10-CM

## 2011-01-30 DIAGNOSIS — I890 Lymphedema, not elsewhere classified: Secondary | ICD-10-CM

## 2011-01-30 DIAGNOSIS — D509 Iron deficiency anemia, unspecified: Secondary | ICD-10-CM

## 2011-01-30 DIAGNOSIS — D649 Anemia, unspecified: Secondary | ICD-10-CM

## 2011-01-30 LAB — FERRITIN: Ferritin: 654 ng/mL — ABNORMAL HIGH (ref 10–291)

## 2011-01-30 LAB — CBC WITH DIFFERENTIAL/PLATELET
BASO%: 0.8 % (ref 0.0–2.0)
Basophils Absolute: 0 10*3/uL (ref 0.0–0.1)
Eosinophils Absolute: 0.1 10*3/uL (ref 0.0–0.5)
HCT: 37.1 % (ref 34.8–46.6)
HGB: 12.2 g/dL (ref 11.6–15.9)
MONO#: 0.2 10*3/uL (ref 0.1–0.9)
NEUT#: 2.2 10*3/uL (ref 1.5–6.5)
NEUT%: 48.2 % (ref 38.4–76.8)
Platelets: 167 10*3/uL (ref 145–400)
WBC: 4.6 10*3/uL (ref 3.9–10.3)
lymph#: 2 10*3/uL (ref 0.9–3.3)

## 2011-01-30 LAB — COMPREHENSIVE METABOLIC PANEL
ALT: 50 U/L — ABNORMAL HIGH (ref 0–35)
BUN: 14 mg/dL (ref 6–23)
CO2: 28 mEq/L (ref 19–32)
Calcium: 9.2 mg/dL (ref 8.4–10.5)
Chloride: 101 mEq/L (ref 96–112)
Creatinine, Ser: 0.88 mg/dL (ref 0.50–1.10)
Glucose, Bld: 117 mg/dL — ABNORMAL HIGH (ref 70–99)

## 2011-01-30 LAB — IRON AND TIBC
Iron: 72 ug/dL (ref 42–145)
TIBC: 331 ug/dL (ref 250–470)

## 2011-03-08 ENCOUNTER — Encounter (INDEPENDENT_AMBULATORY_CARE_PROVIDER_SITE_OTHER): Payer: Medicare Other | Admitting: Ophthalmology

## 2011-03-08 ENCOUNTER — Other Ambulatory Visit: Payer: Self-pay | Admitting: Gastroenterology

## 2011-03-08 DIAGNOSIS — H35039 Hypertensive retinopathy, unspecified eye: Secondary | ICD-10-CM

## 2011-03-08 DIAGNOSIS — H43819 Vitreous degeneration, unspecified eye: Secondary | ICD-10-CM

## 2011-03-08 DIAGNOSIS — D490 Neoplasm of unspecified behavior of digestive system: Secondary | ICD-10-CM

## 2011-03-08 DIAGNOSIS — H348392 Tributary (branch) retinal vein occlusion, unspecified eye, stable: Secondary | ICD-10-CM

## 2011-03-08 DIAGNOSIS — H33309 Unspecified retinal break, unspecified eye: Secondary | ICD-10-CM

## 2011-03-14 ENCOUNTER — Ambulatory Visit
Admission: RE | Admit: 2011-03-14 | Discharge: 2011-03-14 | Disposition: A | Discharge status: Home / Self Care | Payer: Medicare Other | Source: Ambulatory Visit | Attending: Gastroenterology | Admitting: Gastroenterology

## 2011-03-21 ENCOUNTER — Ambulatory Visit (INDEPENDENT_AMBULATORY_CARE_PROVIDER_SITE_OTHER): Payer: Medicare Other | Admitting: Internal Medicine

## 2011-03-21 ENCOUNTER — Encounter: Payer: Self-pay | Admitting: Internal Medicine

## 2011-03-21 VITALS — BP 118/82 | HR 66 | Ht 68.0 in | Wt 219.2 lb

## 2011-03-21 DIAGNOSIS — J309 Allergic rhinitis, unspecified: Secondary | ICD-10-CM

## 2011-03-21 DIAGNOSIS — G4730 Sleep apnea, unspecified: Secondary | ICD-10-CM

## 2011-03-21 DIAGNOSIS — Z853 Personal history of malignant neoplasm of breast: Secondary | ICD-10-CM

## 2011-03-21 DIAGNOSIS — I2699 Other pulmonary embolism without acute cor pulmonale: Secondary | ICD-10-CM

## 2011-03-21 DIAGNOSIS — J45909 Unspecified asthma, uncomplicated: Secondary | ICD-10-CM

## 2011-03-21 MED ORDER — ALBUTEROL SULFATE HFA 108 (90 BASE) MCG/ACT IN AERS: 2.0000 | INHALATION_SPRAY | Freq: Four times a day (QID) | RESPIRATORY_TRACT | Status: DC | PRN

## 2011-03-21 NOTE — Assessment & Plan Note (Signed)
Good control, but needs rescue inhaler refilled and we discussed medication roles.

## 2011-03-21 NOTE — Progress Notes (Signed)
Subjective:    Patient ID: Kelli Martinez, female    DOB: 10/20/1945, 65 y.o.   MRN: [id number]  HPI 03/21/11- 35 yoF former smoker followed for OSA, allergic rhinitis,asthma,  hx pulm embolism/DVT/filter/coumadin(Dr Lucianne Muss), hx R breast Ca/ mastectomy/ xrt/chem/bonemarrow transplant at [facility], hx gastric bypass Last here March 22, 2010 Uses CPAP 6 [facility]- most of each night. Averages 6 hrs sleep/ night. Pressure seems right. Wakes in AM with dry mouth. Sleeps alone.  Wakes with dry mouth and stuffy nose.  Continues Advair. Dry cough can lead to asthma. Needs rescue inhaler refilled.- needed it daily while working summer garden.  Review of Systems Constitutional:   No-   weight loss, night sweats, fevers, chills, fatigue, lassitude. HEENT:   No-  headaches, difficulty swallowing, tooth/dental problems, sore throat,   +dry mouth      No-  sneezing, itching, ear ache,              +nasal congestion, post nasal drip,  CV:  No-   chest pain, orthopnea, PND, swelling in lower extremities, anasarca, dizziness, palpitations Resp: No-   shortness of breath with exertion or at rest.              No-   productive cough,  No non-productive cough,  No-  coughing up of blood.              No-   change in color of mucus.   Skin: No-   rash or lesions. GI:  No-   heartburn, indigestion, abdominal pain, nausea, vomiting, diarrhea,                 change in bowel habits, loss of appetite GU: No-   dysuria, change in color of urine, no urgency or frequency.  No- flank pain. MS:  No-   joint pain or swelling.  No- decreased range of motion.  No- back pain. Neuro- :  Psych:  No- change in mood or affect. No depression or anxiety.  No memory loss.      Objective:   Physical Exam General- Alert, Oriented, Affect-appropriate, Distress- none acute  obese Skin- rash-none, lesions- none, excoriation- none. XRT skin changes right lateral neck Lymphadenopathy- none Head- atraumatic            Eyes- Gross  vision intact, PERRLA, conjunctivae clear secretions            Ears- Hearing, canals normal            Nose- Clear, no- Septal dev, mucus, polyps, erosion, perforation             Throat- Mallampati II , mucosa clear , drainage- none, tonsils- atrophic Neck- flexible , trachea midline, no stridor , thyroid nl, carotid no bruit Chest - symmetrical excursion , unlabored           Heart/CV- RRR , no murmur , no gallop  , no rub, nl s1 s2                           - JVD- none , edema- none, stasis changes- none, varices- none           Lung- clear to P&A, wheeze- none, cough- none , dullness-none, rub- none           Chest wall-  Abd- tender-no, distended-no, bowel sounds-present, HSM- no Br/ Gen/ Rectal- Not done, not indicated Extrem- cyanosis- none, clubbing, none, atrophy- none, strength-  nl   Heavy legs Neuro- grossly intact to observation         Assessment & Plan:

## 2011-03-21 NOTE — Assessment & Plan Note (Signed)
More often stuffy. I am introducing Neti pot that might help more than a medication.

## 2011-03-21 NOTE — Patient Instructions (Signed)
Continue CPAP at 6- our goal is all night every night  Try otc Biotene for dry mouth- your drug store will have it in several forms- like mouth wash or spray. Using it at night before sleep, and as often as you need, should help some with the dry mouth.    Script sent to [facility] refilling rescue inhaler Proair  Try Neti pot saline rinse for your nasal passages if over dry or stuffy

## 2011-03-21 NOTE — Assessment & Plan Note (Signed)
There are comfort issues, mainly related to mouth breathing and dryness. We will see if Biotene can help.

## 2011-04-13 ENCOUNTER — Encounter: Payer: Self-pay | Admitting: Cardiology

## 2011-04-21 ENCOUNTER — Ambulatory Visit (INDEPENDENT_AMBULATORY_CARE_PROVIDER_SITE_OTHER): Payer: Medicare Other | Admitting: Cardiology

## 2011-04-21 ENCOUNTER — Encounter: Payer: Self-pay | Admitting: Cardiology

## 2011-04-21 VITALS — BP 120/78 | HR 60 | Ht 68.0 in | Wt 218.2 lb

## 2011-04-21 DIAGNOSIS — R55 Syncope and collapse: Secondary | ICD-10-CM

## 2011-04-21 DIAGNOSIS — I10 Essential (primary) hypertension: Secondary | ICD-10-CM

## 2011-04-21 NOTE — Patient Instructions (Signed)
Continue your current therapy  I will see you back as needed.   

## 2011-04-22 DIAGNOSIS — R55 Syncope and collapse: Secondary | ICD-10-CM | POA: Insufficient documentation

## 2011-04-22 DIAGNOSIS — I10 Essential (primary) hypertension: Secondary | ICD-10-CM | POA: Insufficient documentation

## 2011-04-22 NOTE — Assessment & Plan Note (Signed)
Her blood pressure control appears to be acceptable. In fact overall she appears to be doing very well from a cardiac standpoint. Extensive cardiac evaluation in the past was unremarkable. I do not think that she needs regular followup with me but will see her back on an as-needed basis.

## 2011-04-22 NOTE — Progress Notes (Signed)
Kelli Martinez Date of Birth: 11-16-1945   History of Present Illness: Kelli Martinez is seen today for followup. She was last seen a year and a half ago. She has remote history of syncope with normal cardiac evaluation in the past including echocardiogram, nuclear stress testing, and tilt table testing. She has a history of recurrent pulmonary emboli, diabetes, and hypertension. She denies any current cardiac complaints. Since her last visit here she underwent gastric bypass surgery and lost 103 pounds. She reports that her postoperative course was complicated by internal bleeding. She then developed a DVT affecting her entire left leg. She does complain of occasional weak spells but apparently has been experiencing periods of hypoglycemia.  Current Outpatient Prescriptions on File Prior to Visit  Medication Sig Dispense Refill  . albuterol (PROAIR HFA) 108 (90 BASE) MCG/ACT inhaler Inhale 2 puffs into the lungs 4 (four) times daily as needed for wheezing or shortness of breath. As needed rescue inhaler  3 Inhaler  3  . atorvastatin (LIPITOR) 40 MG tablet Take 40 mg by mouth daily.        . ergocalciferol (VITAMIN D2) 50000 UNITS capsule Take 50,000 Units by mouth once a week.        . ferrous sulfate 325 (65 FE) MG tablet Take 325 mg by mouth daily.        . fluticasone (FLONASE) 50 MCG/ACT nasal spray Place 2 sprays into the nose daily.  48 g  3  . Fluticasone-Salmeterol (ADVAIR DISKUS) 100-50 MCG/DOSE AEPB Inhale 1 puff into the lungs every 12 (twelve) hours.        . furosemide (LASIX) 20 MG tablet Take 20 mg by mouth daily.        Marland Kitchen gabapentin (NEURONTIN) 300 MG capsule Take 300 mg by mouth 3 (three) times daily.       Marland Kitchen HYDROcodone-acetaminophen (VICODIN) 5-500 MG per tablet Take 1 tablet by mouth every 6 (six) hours as needed.        Marland Kitchen levothyroxine (SYNTHROID, LEVOTHROID) 125 MCG tablet Take 125 mcg by mouth daily.        . magnesium chloride (SLOW-MAG) 64 MG TBEC Take 1 tablet by  mouth 2 (two) times daily.        . NON FORMULARY CPAP 6 [facility]       . omeprazole (PRILOSEC) 20 MG capsule Take 20 mg by mouth daily.        . potassium chloride SA (K-DUR,KLOR-CON) 20 MEQ tablet Take 20 mEq by mouth daily.       . vitamin B-12 (CYANOCOBALAMIN) 500 MCG tablet Take 500 mcg by mouth daily.        Marland Kitchen warfarin (COUMADIN) 6 MG tablet Take 6 mg by mouth daily. Take 10 mg on Sun, Wed and Fri ; 12 mg other 4 days        No Known Allergies  Past Medical History  Diagnosis Date  . Allergy   . Asthma   . OSA (obstructive sleep apnea)   . Pulmonary embolism   . HX: breast cancer   . Blind right eye     hemorrhage  . Hypothyroid   . Clotting disorder   . GERD (gastroesophageal reflux disease)   . Hypertension   . Diabetes mellitus   . Blood transfusion   . Cancer   . Morbid obesity   . Syncope and collapse     Past Surgical History  Procedure Date  . Abdominal hysterectomy   . Mastectomy  Right  . Gastric bypass 2011    bariatric surgery  . Ivc filter     recurrent DVT  . Total knee arthroplasty   . Bone marrow transplant   . Cardiovascular stress test 05/23/2005    EF 53%  . US echocardiography 10/27/08    EF 55-60%    History  Smoking status  . Former Smoker -- 0.5 packs/day for 10 years  . Types: Cigarettes  . Quit date: 07/31/1978  Smokeless tobacco  . Not on file    History  Alcohol Use No    Family History  Problem Relation Age of Onset  . Sudden death Mother     car accident  . Cancer Father 106    lung  . Cancer Sister 56    lung cancer  . Asthma Daughter     Review of Systems: The review of systems is positive for arthritic pain in her knees and lower back and foot. She has had elevated liver function studies and has been evaluated by gastroenterology. She does complain of pain in her left side when she lies on that side.  All other systems were reviewed and are negative.  Physical Exam: BP 120/78  Pulse 60  Ht 5\' 8"  (1.727  m)  Wt 218 lb 3.2 oz (98.975 kg)  BMI 33.18 kg/m2 She is an overweight black female in no acute distress.The patient is alert and oriented x 3.  The mood and affect are normal.  The skin is warm and dry.  Color is normal.  The HEENT exam reveals that the sclera are nonicteric.  The mucous membranes are moist.  The carotids are 2+ without bruits.  There is no thyromegaly.  There is no JVD.  The lungs are clear.  The chest wall is non tender.  The heart exam reveals a regular rate with a normal S1 and S2.  There are no murmurs, gallops, or rubs.  The PMI is not displaced.   Abdominal exam reveals good bowel sounds.  There is no guarding or rebound.  There is no hepatosplenomegaly or tenderness.  There are no masses.  Exam of the legs reveal no clubbing, cyanosis, or edema.  The legs are without rashes.  The distal pulses are intact.  Cranial nerves II - XII are intact.  Motor and sensory functions are intact.  The gait is normal.  LABORATORY DATA:   Assessment / Plan:

## 2011-05-04 ENCOUNTER — Encounter (HOSPITAL_BASED_OUTPATIENT_CLINIC_OR_DEPARTMENT_OTHER): Payer: Medicare Other | Admitting: Oncology

## 2011-05-04 ENCOUNTER — Other Ambulatory Visit: Payer: Self-pay | Admitting: Medical

## 2011-05-04 DIAGNOSIS — D649 Anemia, unspecified: Secondary | ICD-10-CM

## 2011-05-04 DIAGNOSIS — Z853 Personal history of malignant neoplasm of breast: Secondary | ICD-10-CM

## 2011-05-04 DIAGNOSIS — D509 Iron deficiency anemia, unspecified: Secondary | ICD-10-CM

## 2011-05-04 DIAGNOSIS — D638 Anemia in other chronic diseases classified elsewhere: Secondary | ICD-10-CM

## 2011-05-04 DIAGNOSIS — I890 Lymphedema, not elsewhere classified: Secondary | ICD-10-CM

## 2011-05-04 LAB — CBC WITH DIFFERENTIAL/PLATELET
BASO%: 1.6 % (ref 0.0–2.0)
EOS%: 2.3 % (ref 0.0–7.0)
LYMPH%: 38.4 % (ref 14.0–49.7)
MCH: 29.5 pg (ref 25.1–34.0)
MCHC: 33.5 g/dL (ref 31.5–36.0)
MONO#: 0.2 10*3/uL (ref 0.1–0.9)
Platelets: 165 10*3/uL (ref 145–400)
RBC: 4.22 10*6/uL (ref 3.70–5.45)
WBC: 3.6 10*3/uL — ABNORMAL LOW (ref 3.9–10.3)

## 2011-05-04 LAB — COMPREHENSIVE METABOLIC PANEL
ALT: 58 U/L — ABNORMAL HIGH (ref 0–35)
Albumin: 4.2 g/dL (ref 3.5–5.2)
CO2: 28 mEq/L (ref 19–32)
Chloride: 104 mEq/L (ref 96–112)
Glucose, Bld: 103 mg/dL — ABNORMAL HIGH (ref 70–99)
Potassium: 4 mEq/L (ref 3.5–5.3)
Sodium: 142 mEq/L (ref 135–145)
Total Protein: 6.7 g/dL (ref 6.0–8.3)

## 2011-05-04 LAB — IRON AND TIBC
%SAT: 38 % (ref 20–55)
Iron: 127 ug/dL (ref 42–145)

## 2011-05-04 LAB — FERRITIN: Ferritin: 618 ng/mL — ABNORMAL HIGH (ref 10–291)

## 2011-05-09 LAB — COMPREHENSIVE METABOLIC PANEL
ALT: 28
AST: 32
CO2: 23
Calcium: 9.1
GFR calc Af Amer: >60
GFR calc non Af Amer: >60
Potassium: 4.3
Sodium: 134 — ABNORMAL LOW

## 2011-05-09 LAB — DIFFERENTIAL
Eosinophils Absolute: 0 — ABNORMAL LOW
Eosinophils Relative: 1
Lymphs Abs: 1.1
Monocytes Relative: 3

## 2011-05-09 LAB — LIPASE, BLOOD: Lipase: 14

## 2011-05-09 LAB — POCT CARDIAC MARKERS
CKMB, poc: <1 — ABNORMAL LOW
Operator id: 196461
Troponin i, poc: <0.05

## 2011-05-09 LAB — CBC
MCHC: 32.4
RBC: 4.28
WBC: 5.4

## 2011-08-05 ENCOUNTER — Telehealth: Payer: Self-pay | Admitting: Oncology

## 2011-08-05 NOTE — Telephone Encounter (Signed)
pt aware of  09/28/11 appt,per mosaiq   aom

## 2011-08-15 DIAGNOSIS — E559 Vitamin D deficiency, unspecified: Secondary | ICD-10-CM | POA: Diagnosis not present

## 2011-08-15 DIAGNOSIS — E119 Type 2 diabetes mellitus without complications: Secondary | ICD-10-CM | POA: Diagnosis not present

## 2011-08-15 DIAGNOSIS — E039 Hypothyroidism, unspecified: Secondary | ICD-10-CM | POA: Diagnosis not present

## 2011-08-17 DIAGNOSIS — R609 Edema, unspecified: Secondary | ICD-10-CM | POA: Diagnosis not present

## 2011-08-17 DIAGNOSIS — R748 Abnormal levels of other serum enzymes: Secondary | ICD-10-CM | POA: Diagnosis not present

## 2011-08-17 DIAGNOSIS — E119 Type 2 diabetes mellitus without complications: Secondary | ICD-10-CM | POA: Diagnosis not present

## 2011-08-17 DIAGNOSIS — Z7901 Long term (current) use of anticoagulants: Secondary | ICD-10-CM | POA: Diagnosis not present

## 2011-08-17 DIAGNOSIS — E039 Hypothyroidism, unspecified: Secondary | ICD-10-CM | POA: Diagnosis not present

## 2011-08-17 DIAGNOSIS — I2699 Other pulmonary embolism without acute cor pulmonale: Secondary | ICD-10-CM | POA: Diagnosis not present

## 2011-08-17 DIAGNOSIS — E559 Vitamin D deficiency, unspecified: Secondary | ICD-10-CM | POA: Diagnosis not present

## 2011-08-17 DIAGNOSIS — D649 Anemia, unspecified: Secondary | ICD-10-CM | POA: Diagnosis not present

## 2011-09-11 DIAGNOSIS — I2699 Other pulmonary embolism without acute cor pulmonale: Secondary | ICD-10-CM | POA: Diagnosis not present

## 2011-09-11 DIAGNOSIS — R42 Dizziness and giddiness: Secondary | ICD-10-CM | POA: Diagnosis not present

## 2011-09-11 DIAGNOSIS — R609 Edema, unspecified: Secondary | ICD-10-CM | POA: Diagnosis not present

## 2011-09-11 DIAGNOSIS — R252 Cramp and spasm: Secondary | ICD-10-CM | POA: Diagnosis not present

## 2011-09-13 ENCOUNTER — Encounter (INDEPENDENT_AMBULATORY_CARE_PROVIDER_SITE_OTHER): Payer: Medicare Other | Admitting: Vascular Surgery

## 2011-09-13 DIAGNOSIS — Z86718 Personal history of other venous thrombosis and embolism: Secondary | ICD-10-CM

## 2011-09-13 DIAGNOSIS — M79609 Pain in unspecified limb: Secondary | ICD-10-CM

## 2011-09-13 DIAGNOSIS — M7989 Other specified soft tissue disorders: Secondary | ICD-10-CM | POA: Diagnosis not present

## 2011-09-14 DIAGNOSIS — M1710 Unilateral primary osteoarthritis, unspecified knee: Secondary | ICD-10-CM | POA: Diagnosis not present

## 2011-09-18 ENCOUNTER — Other Ambulatory Visit: Payer: Self-pay | Admitting: Vascular Surgery

## 2011-09-18 DIAGNOSIS — I2699 Other pulmonary embolism without acute cor pulmonale: Secondary | ICD-10-CM | POA: Diagnosis not present

## 2011-09-18 DIAGNOSIS — M7989 Other specified soft tissue disorders: Secondary | ICD-10-CM

## 2011-09-18 DIAGNOSIS — E119 Type 2 diabetes mellitus without complications: Secondary | ICD-10-CM | POA: Diagnosis not present

## 2011-09-18 DIAGNOSIS — M79609 Pain in unspecified limb: Secondary | ICD-10-CM

## 2011-09-18 NOTE — Procedures (Unsigned)
DUPLEX DEEP VENOUS EXAM - LOWER EXTREMITY  INDICATION:  Left lower extremity edema.  HISTORY:  Edema:  Yes. Trauma/Surgery:  No. Pain:  Yes. PE: Previous DVT:  Yes. Anticoagulants:  Yes. Other:  DUPLEX EXAM:               CFV   SFV   PopV   PTV   GSV               R  L  R  L  R  L   R  L  R  L Thrombosis    o  o     +     +      +     o Spontaneous   +  +     +     +      +     + Phasic        +  +     +     +      +     + Augmentation  +  +     D     D      D     + Compressible  +  +     P     P      P     + Competent     +  +     o     +      o     o  Legend:  + - yes  o - no  p - partial  D - decreased  IMPRESSION: 1. Chronic nonoccluding deep venous thrombosis present involving the     left superficial, popliteal, and posterior tibial veins.  The     remainder of the left lower extremity deep venous system appears     patent and compressible. 2. There is deep and superficial femoral vein reflux present, as noted     above. 3. Contralateral common femoral vein is patent and compressible.  Preliminary results were called and given to April at Dr. Remus Blake office at approximately 4:15 p.m. today.   _____________________________ Fransisco Hertz, MD  SH/MEDQ  D:  09/13/2011  T:  09/13/2011  Job:  [id number]

## 2011-09-20 ENCOUNTER — Other Ambulatory Visit: Payer: Self-pay | Admitting: *Deleted

## 2011-09-20 MED ORDER — FLUTICASONE-SALMETEROL 100-50 MCG/DOSE IN AEPB: 1.0000 | INHALATION_SPRAY | Freq: Two times a day (BID) | RESPIRATORY_TRACT | Status: DC

## 2011-09-21 ENCOUNTER — Other Ambulatory Visit: Payer: Medicare Other | Admitting: Lab

## 2011-09-21 ENCOUNTER — Ambulatory Visit: Payer: Medicare Other | Admitting: Oncology

## 2011-09-22 DIAGNOSIS — I2699 Other pulmonary embolism without acute cor pulmonale: Secondary | ICD-10-CM | POA: Diagnosis not present

## 2011-09-28 ENCOUNTER — Telehealth: Payer: Self-pay | Admitting: Oncology

## 2011-09-28 ENCOUNTER — Other Ambulatory Visit: Payer: Medicare Other | Admitting: Lab

## 2011-09-28 ENCOUNTER — Ambulatory Visit (HOSPITAL_BASED_OUTPATIENT_CLINIC_OR_DEPARTMENT_OTHER): Payer: Medicare Other | Admitting: Oncology

## 2011-09-28 VITALS — BP 124/74 | HR 67 | Temp 97.5°F | Ht 68.0 in | Wt 218.2 lb

## 2011-09-28 DIAGNOSIS — I2699 Other pulmonary embolism without acute cor pulmonale: Secondary | ICD-10-CM | POA: Diagnosis not present

## 2011-09-28 DIAGNOSIS — Z853 Personal history of malignant neoplasm of breast: Secondary | ICD-10-CM

## 2011-09-28 DIAGNOSIS — E559 Vitamin D deficiency, unspecified: Secondary | ICD-10-CM | POA: Diagnosis not present

## 2011-09-28 DIAGNOSIS — D509 Iron deficiency anemia, unspecified: Secondary | ICD-10-CM | POA: Diagnosis not present

## 2011-09-28 DIAGNOSIS — D638 Anemia in other chronic diseases classified elsewhere: Secondary | ICD-10-CM

## 2011-09-28 DIAGNOSIS — I809 Phlebitis and thrombophlebitis of unspecified site: Secondary | ICD-10-CM | POA: Diagnosis not present

## 2011-09-28 DIAGNOSIS — I890 Lymphedema, not elsewhere classified: Secondary | ICD-10-CM | POA: Diagnosis not present

## 2011-09-28 DIAGNOSIS — Z9884 Bariatric surgery status: Secondary | ICD-10-CM

## 2011-09-28 DIAGNOSIS — D649 Anemia, unspecified: Secondary | ICD-10-CM | POA: Diagnosis not present

## 2011-09-28 DIAGNOSIS — E039 Hypothyroidism, unspecified: Secondary | ICD-10-CM | POA: Diagnosis not present

## 2011-09-28 DIAGNOSIS — E119 Type 2 diabetes mellitus without complications: Secondary | ICD-10-CM | POA: Diagnosis not present

## 2011-09-28 DIAGNOSIS — Z86718 Personal history of other venous thrombosis and embolism: Secondary | ICD-10-CM

## 2011-09-28 DIAGNOSIS — R748 Abnormal levels of other serum enzymes: Secondary | ICD-10-CM | POA: Diagnosis not present

## 2011-09-28 DIAGNOSIS — D539 Nutritional anemia, unspecified: Secondary | ICD-10-CM

## 2011-09-28 LAB — CBC WITH DIFFERENTIAL/PLATELET
Basophils Absolute: 0 10*3/uL (ref 0.0–0.1)
EOS%: 2.4 % (ref 0.0–7.0)
HCT: 37.6 % (ref 34.8–46.6)
HGB: 12.3 g/dL (ref 11.6–15.9)
LYMPH%: 40.2 % (ref 14.0–49.7)
MCH: 28.7 pg (ref 25.1–34.0)
MCV: 87.9 fL (ref 79.5–101.0)
MONO%: 5.6 % (ref 0.0–14.0)
NEUT%: 51 % (ref 38.4–76.8)
Platelets: 172 10*3/uL (ref 145–400)

## 2011-09-28 LAB — COMPREHENSIVE METABOLIC PANEL
AST: 42 U/L — ABNORMAL HIGH (ref 0–37)
Alkaline Phosphatase: 91 U/L (ref 39–117)
BUN: 12 mg/dL (ref 6–23)
Calcium: 9.1 mg/dL (ref 8.4–10.5)
Chloride: 106 mEq/L (ref 96–112)
Creatinine, Ser: 0.86 mg/dL (ref 0.50–1.10)
Total Bilirubin: 0.3 mg/dL (ref 0.3–1.2)

## 2011-09-28 LAB — VITAMIN B12: Vitamin B-12: 879 pg/mL (ref 211–911)

## 2011-09-28 LAB — IRON AND TIBC
%SAT: 22 % (ref 20–55)
TIBC: 317 ug/dL (ref 250–470)

## 2011-09-28 NOTE — Progress Notes (Signed)
Hematology and Oncology Follow Up Visit  Kelli Martinez [id number] [birth year] 66 y.o. 09/28/2011 9:58 AM  CC: Kelli Martinez, M.D.  Kelli Park [name], MD    Principle Diagnosis:  This is a 66 year old female with the following issues: 1. Multifactorial anemia.  She has an element of iron deficiency, as well as anemia of renal disease, currently on p.o. iron replacement, and has not really required any IV iron recently. 2. History of breast cancer diagnosed in 1993.  She is status post mastectomy followed by adjuvant radiation therapy.  She had also received high-dose chemotherapy and stem-cell transplantation in 1994.  She continues to be in remission from that standpoint.   3. History of gastric bypass surgery done in April 2011.   Interim History: Kelli Martinez presents today for a followup visit.  She has reported a few symptoms since the last time I saw her.  She has had some mild elevation in her transaminases; again, with a GI workup is onging.  She is reporting that her energy is reasonable and she does not experience any hypoglycemic episodes.  She does not report any chest pain; does not report any difficulty breathing.  For the most part her performance status and activity level remain reasonable.  Her weight, as mentioned, continues to drop after her gastric bypass procedure. She has lost over 100 lbs. No new complaints.    Medications: I have reviewed the patient's current medications. Current outpatient prescriptions:albuterol (PROAIR HFA) 108 (90 BASE) MCG/ACT inhaler, Inhale 2 puffs into the lungs 4 (four) times daily as needed for wheezing or shortness of breath. As needed rescue inhaler, Disp: 3 Inhaler, Rfl: 3;  atorvastatin (LIPITOR) 40 MG tablet, Take 40 mg by mouth daily.  , Disp: , Rfl: ;  ergocalciferol (VITAMIN D2) 50000 UNITS capsule, Take 50,000 Units by mouth once a week.  , Disp: , Rfl:  ferrous sulfate 325 (65 FE) MG tablet, Take 325 mg by mouth daily.  , Disp: , Rfl: ;   fluticasone (FLONASE) 50 MCG/ACT nasal spray, Place 2 sprays into the nose daily., Disp: 48 g, Rfl: 3;  Fluticasone-Salmeterol (ADVAIR DISKUS) 100-50 MCG/DOSE AEPB, Inhale 1 puff into the lungs every 12 (twelve) hours., Disp: 180 each, Rfl: 1;  furosemide (LASIX) 20 MG tablet, Take 20 mg by mouth daily.  , Disp: , Rfl:  gabapentin (NEURONTIN) 300 MG capsule, Take 300 mg by mouth 3 (three) times daily. , Disp: , Rfl: ;  HYDROcodone-acetaminophen (VICODIN) 5-500 MG per tablet, Take 1 tablet by mouth every 6 (six) hours as needed.  , Disp: , Rfl: ;  levothyroxine (SYNTHROID, LEVOTHROID) 125 MCG tablet, Take 125 mcg by mouth daily.  , Disp: , Rfl: ;  magnesium chloride (SLOW-MAG) 64 MG TBEC, Take 1 tablet by mouth 2 (two) times daily.  , Disp: , Rfl:  NON FORMULARY, CPAP 6 [facility] , Disp: , Rfl: ;  omeprazole (PRILOSEC) 20 MG capsule, Take 20 mg by mouth daily.  , Disp: , Rfl: ;  potassium chloride SA (K-DUR,KLOR-CON) 20 MEQ tablet, Take 20 mEq by mouth daily. , Disp: , Rfl: ;  vitamin B-12 (CYANOCOBALAMIN) 500 MCG tablet, Take 500 mcg by mouth daily.  , Disp: , Rfl: ;  warfarin (COUMADIN) 6 MG tablet, Take 6 mg by mouth daily. Take 10 mg on Sun, Wed and Fri ; 12 mg other 4 days, Disp: , Rfl:   Allergies: No Known Allergies  Past Medical History, Surgical history, Social history, and Family History were reviewed  and updated.  Review of Systems: Constitutional:  Negative for fever, chills, night sweats, anorexia, weight loss, pain. Cardiovascular: no chest pain or dyspnea on exertion Respiratory: no cough, shortness of breath, or wheezing Neurological: negative Dermatological: negative ENT: negative Skin: Negative. Gastrointestinal: no abdominal pain, change in bowel habits, or black or bloody stools Genito-Urinary: negative Hematological and Lymphatic: negative Breast: negative Musculoskeletal: negative Remaining ROS negative. Physical Exam: Blood pressure 124/74, pulse 67, temperature 97.5 F  (36.4 C), temperature source Oral, height 5\' 8"  (1.727 m), weight 218 lb 3.2 oz (98.975 kg). ECOG: 1 General appearance: alert Head: Normocephalic, without obvious abnormality, atraumatic Neck: no adenopathy, no carotid bruit, no JVD, supple, symmetrical, trachea midline and thyroid not enlarged, symmetric, no tenderness/mass/nodules Lymph nodes: Cervical, supraclavicular, and axillary nodes normal. Heart:regular rate and rhythm, S1, S2 normal, no murmur, click, rub or gallop Lung:chest clear, no wheezing, rales, normal symmetric air entry Abdomin: soft, non-tender, without masses or organomegaly EXT:no erythema, induration, or nodules   Lab Results: Lab Results  Component Value Date   WBC 5.0 09/28/2011   HGB 12.3 09/28/2011   HCT 37.6 09/28/2011   MCV 87.9 09/28/2011   PLT 172 09/28/2011     Chemistry      Component Value Date/Time   NA 142 05/04/2011 0940   NA 142 05/04/2011 0940   K 4.0 05/04/2011 0940   K 4.0 05/04/2011 0940   CL 104 05/04/2011 0940   CL 104 05/04/2011 0940   CO2 28 05/04/2011 0940   CO2 28 05/04/2011 0940   BUN 15 05/04/2011 0940   BUN 15 05/04/2011 0940   CREATININE 0.86 05/04/2011 0940   CREATININE 0.86 05/04/2011 0940      Component Value Date/Time   CALCIUM 9.3 05/04/2011 0940   CALCIUM 9.3 05/04/2011 0940   ALKPHOS 88 05/04/2011 0940   ALKPHOS 88 05/04/2011 0940   AST 48* 05/04/2011 0940   AST 48* 05/04/2011 0940   ALT 58* 05/04/2011 0940   ALT 58* 05/04/2011 0940   BILITOT 0.4 05/04/2011 0940   BILITOT 0.4 05/04/2011 0940        Impression and Plan: This is a 66 year old female with the following issues: 1. Multifactorial anemia.  This is predominately anemia of iron deficiency.  She is on iron replacement and continues to do quite well.  Her hemoglobin is excellent.  I am rechecking her iron stores, also checking B12 and folic acid.  Given her gastric bypass surgery she is at risk of having nutritional deficiencies and it is paramount to continue to monitor  those at this time.  2. History of breast cancer.  She has no evidence of any recurrence at this time.  She is up to speed on her mammogram and physical examination.  She does have a calcified small nodule at the incision site; again, it appeared to be more of a sebaceous cyst.  However, malignancy cannot be ruled out.  I will monitor it closely.  I told her if it grows then it would certainly be amenable for a biopsy.  Again, she is about 20 years out from her breast cancer.  I think it is less likely, but it always a possibility at this time. 3. History of deep vein thrombosis, currently anticoagulated with Coumadin.  Followup will be in 8/ 2013.   [facility], MD 2/28/20139:58 AM

## 2011-09-28 NOTE — Telephone Encounter (Signed)
appts made and printed for 8/28  aom 

## 2011-10-02 ENCOUNTER — Telehealth: Payer: Self-pay | Admitting: Internal Medicine

## 2011-10-02 MED ORDER — CEFDINIR 300 MG PO CAPS: 300.0000 mg | ORAL_CAPSULE | Freq: Two times a day (BID) | ORAL | Status: AC

## 2011-10-02 NOTE — Telephone Encounter (Signed)
Per CY-okay to give Cefdinir 300 mg #14 take 1 po bid no refills. Pt is aware of rx and understands it has been sent to [facility] on [address].

## 2011-10-02 NOTE — Telephone Encounter (Signed)
Spoke with patient-having sneezing,stopped up, pressure in head, weakness, fevers (101.8-broke Saturday night) and double vision(has eye appt on Wed 10-04-2011). Please advise if patient needs/can have abx. Thanks.   NKDA

## 2011-10-04 DIAGNOSIS — E1139 Type 2 diabetes mellitus with other diabetic ophthalmic complication: Secondary | ICD-10-CM | POA: Diagnosis not present

## 2011-10-11 DIAGNOSIS — I2699 Other pulmonary embolism without acute cor pulmonale: Secondary | ICD-10-CM | POA: Diagnosis not present

## 2011-10-13 DIAGNOSIS — M1710 Unilateral primary osteoarthritis, unspecified knee: Secondary | ICD-10-CM | POA: Diagnosis not present

## 2011-10-20 DIAGNOSIS — M1710 Unilateral primary osteoarthritis, unspecified knee: Secondary | ICD-10-CM | POA: Diagnosis not present

## 2011-10-30 DIAGNOSIS — M1710 Unilateral primary osteoarthritis, unspecified knee: Secondary | ICD-10-CM | POA: Diagnosis not present

## 2011-10-31 ENCOUNTER — Other Ambulatory Visit: Payer: Self-pay | Admitting: Endocrinology

## 2011-10-31 DIAGNOSIS — I2699 Other pulmonary embolism without acute cor pulmonale: Secondary | ICD-10-CM | POA: Diagnosis not present

## 2011-10-31 DIAGNOSIS — Z1231 Encounter for screening mammogram for malignant neoplasm of breast: Secondary | ICD-10-CM

## 2011-11-13 ENCOUNTER — Telehealth: Payer: Self-pay | Admitting: Internal Medicine

## 2011-11-13 MED ORDER — ALBUTEROL SULFATE HFA 108 (90 BASE) MCG/ACT IN AERS: 2.0000 | INHALATION_SPRAY | Freq: Four times a day (QID) | RESPIRATORY_TRACT | Status: DC | PRN

## 2011-11-13 MED ORDER — FLUTICASONE-SALMETEROL 100-50 MCG/DOSE IN AEPB: 1.0000 | INHALATION_SPRAY | Freq: Two times a day (BID) | RESPIRATORY_TRACT | Status: DC

## 2011-11-13 NOTE — Telephone Encounter (Signed)
last ov with CDY 8.21.12, follow up in 1 year > 8.21.13.  Called spoke with patient, verified the medications requested and dosages.  Refills sent to verified pharmacy.  Encouraged pt to call with any other needs/concerns.  Nothing further needed.

## 2011-11-14 ENCOUNTER — Telehealth: Payer: Self-pay | Admitting: *Deleted

## 2011-11-14 ENCOUNTER — Encounter: Payer: Self-pay | Admitting: *Deleted

## 2011-11-14 ENCOUNTER — Encounter (INDEPENDENT_AMBULATORY_CARE_PROVIDER_SITE_OTHER): Payer: Self-pay | Admitting: Surgery

## 2011-11-14 DIAGNOSIS — D539 Nutritional anemia, unspecified: Secondary | ICD-10-CM | POA: Insufficient documentation

## 2011-11-14 DIAGNOSIS — D509 Iron deficiency anemia, unspecified: Secondary | ICD-10-CM | POA: Insufficient documentation

## 2011-11-14 NOTE — Telephone Encounter (Signed)
Spoke with dr Clelia Croft and beverly in the lab and hopefully diagnosis codes are appropiate for ins coverqage of tests in queation.

## 2011-11-22 ENCOUNTER — Ambulatory Visit (INDEPENDENT_AMBULATORY_CARE_PROVIDER_SITE_OTHER): Payer: Medicare Other | Admitting: Surgery

## 2011-11-22 ENCOUNTER — Encounter (INDEPENDENT_AMBULATORY_CARE_PROVIDER_SITE_OTHER): Payer: Self-pay | Admitting: Surgery

## 2011-11-22 VITALS — BP 126/75 | HR 63 | Temp 98.6°F | Ht 69.0 in | Wt 222.2 lb

## 2011-11-22 DIAGNOSIS — Z9884 Bariatric surgery status: Secondary | ICD-10-CM | POA: Diagnosis not present

## 2011-11-22 NOTE — Patient Instructions (Signed)
Ask Dr. Madilyn Fireman to keep me in the loop on your liver workup.

## 2011-11-22 NOTE — Progress Notes (Signed)
Kelli Martinez 66 y.o.  Body mass index is 32.81 kg/(m^2).  Patient Active Problem List  Diagnoses  . PULMONARY EMBOLISM  . ALLERGIC RHINITIS  . ASTHMA  . SLEEP APNEA  . BREAST CANCER, HX OF  . Left leg paresthesias  . Hypothyroid  . Low back pain  . Hypertension  . Morbid obesity  . Syncope and collapse  . Unspecified deficiency anemia    No Known Allergies  Past Surgical History  Procedure Date  . Abdominal hysterectomy   . Mastectomy     Right  . Gastric bypass 2011    bariatric surgery  . Ivc filter     recurrent DVT  . Total knee arthroplasty   . Bone marrow transplant   . Cardiovascular stress test 05/23/2005    EF 53%  . US echocardiography 10/27/08    EF 55-60%   Sanda Linger, MD, MD No diagnosis found.  Doing well almost 20 years after her breast cancer surgery which was for very advanced disease and 2 years out from a Roux-en-Y gastric bypass. She looked great. We'll give her copies of her pre-and post pictures I will see her back in 6 months. Matt B. [name], MD, [facility] [phone] beeper [phone]  11/22/2011 1:54 PM

## 2011-11-28 DIAGNOSIS — I809 Phlebitis and thrombophlebitis of unspecified site: Secondary | ICD-10-CM | POA: Diagnosis not present

## 2011-11-28 DIAGNOSIS — R748 Abnormal levels of other serum enzymes: Secondary | ICD-10-CM | POA: Diagnosis not present

## 2011-11-28 DIAGNOSIS — E039 Hypothyroidism, unspecified: Secondary | ICD-10-CM | POA: Diagnosis not present

## 2011-11-28 DIAGNOSIS — E559 Vitamin D deficiency, unspecified: Secondary | ICD-10-CM | POA: Diagnosis not present

## 2011-11-28 DIAGNOSIS — E119 Type 2 diabetes mellitus without complications: Secondary | ICD-10-CM | POA: Diagnosis not present

## 2011-11-28 DIAGNOSIS — D649 Anemia, unspecified: Secondary | ICD-10-CM | POA: Diagnosis not present

## 2011-11-30 DIAGNOSIS — E119 Type 2 diabetes mellitus without complications: Secondary | ICD-10-CM | POA: Diagnosis not present

## 2011-11-30 DIAGNOSIS — E559 Vitamin D deficiency, unspecified: Secondary | ICD-10-CM | POA: Diagnosis not present

## 2011-11-30 DIAGNOSIS — D649 Anemia, unspecified: Secondary | ICD-10-CM | POA: Diagnosis not present

## 2011-11-30 DIAGNOSIS — E7800 Pure hypercholesterolemia, unspecified: Secondary | ICD-10-CM | POA: Diagnosis not present

## 2011-11-30 DIAGNOSIS — E039 Hypothyroidism, unspecified: Secondary | ICD-10-CM | POA: Diagnosis not present

## 2011-11-30 DIAGNOSIS — E1149 Type 2 diabetes mellitus with other diabetic neurological complication: Secondary | ICD-10-CM | POA: Diagnosis not present

## 2011-11-30 DIAGNOSIS — R748 Abnormal levels of other serum enzymes: Secondary | ICD-10-CM | POA: Diagnosis not present

## 2011-12-12 ENCOUNTER — Telehealth: Payer: Self-pay | Admitting: Internal Medicine

## 2011-12-12 MED ORDER — PREDNISONE 10 MG PO TABS: ORAL_TABLET | ORAL | Status: DC

## 2011-12-12 MED ORDER — AZITHROMYCIN 250 MG PO TABS: ORAL_TABLET | ORAL | Status: AC

## 2011-12-12 NOTE — Telephone Encounter (Signed)
z pak sent to pharm -- pt aware.

## 2011-12-12 NOTE — Telephone Encounter (Signed)
Per CY-okay to give Zpak #1 take as directed no refills and Prednisone 10 mg #20 take 4 x 2 days, 3 x 2 days, 2 x 2 days, 1 x 2 days, then stop no refills.  

## 2011-12-12 NOTE — Telephone Encounter (Signed)
Called, spoke with pt who states on Saturday night she started having a scratchy throat and PND.  Sunday she started coughing - prod with thick, clear mucus, hoarseness, soreness in chest, wheezing, and some increased SOB.  Also, states she is sneezing, feels "weak," eyes are itching, and has clear drainage from eyes.  Temp yesterday up to 101.2.  Requesting recs.  Dr. Maple Hudson, pls advise.  Thank you.  nkda - verified  [facility] [employer]

## 2011-12-12 NOTE — Telephone Encounter (Signed)
Called, spoke with pt.  I informed her of below recs per Dr. Maple Hudson.  She verbalized understanding of this and aware rxs will be sent to [facility].  She will call back if symptoms do not improve or worsen.    Dr. Maple Hudson, while trying to send z pak received override warning on z pak and coumadin.  States hypoprothrombinemic effects of anticoagulants may be increased by macrolide and ketolides.  Bleeding may occur.  Pls advise if you would still like to proceed with the z pak.  Thank you.  Note:  pred taper has already been sent.

## 2011-12-12 NOTE — Telephone Encounter (Signed)
Yes- almost all antibiotics will interact to some degree.

## 2011-12-18 ENCOUNTER — Ambulatory Visit
Admission: RE | Admit: 2011-12-18 | Discharge: 2011-12-18 | Disposition: A | Discharge status: Home / Self Care | Payer: Medicare Other | Source: Ambulatory Visit | Attending: Endocrinology | Admitting: Endocrinology

## 2011-12-18 DIAGNOSIS — Z1231 Encounter for screening mammogram for malignant neoplasm of breast: Secondary | ICD-10-CM | POA: Diagnosis not present

## 2011-12-28 DIAGNOSIS — Z7901 Long term (current) use of anticoagulants: Secondary | ICD-10-CM | POA: Diagnosis not present

## 2012-01-26 DIAGNOSIS — I2699 Other pulmonary embolism without acute cor pulmonale: Secondary | ICD-10-CM | POA: Diagnosis not present

## 2012-03-05 DIAGNOSIS — R748 Abnormal levels of other serum enzymes: Secondary | ICD-10-CM | POA: Diagnosis not present

## 2012-03-05 DIAGNOSIS — E119 Type 2 diabetes mellitus without complications: Secondary | ICD-10-CM | POA: Diagnosis not present

## 2012-03-05 DIAGNOSIS — R252 Cramp and spasm: Secondary | ICD-10-CM | POA: Diagnosis not present

## 2012-03-06 DIAGNOSIS — M1710 Unilateral primary osteoarthritis, unspecified knee: Secondary | ICD-10-CM | POA: Diagnosis not present

## 2012-03-07 ENCOUNTER — Encounter (INDEPENDENT_AMBULATORY_CARE_PROVIDER_SITE_OTHER): Payer: Medicare Other | Admitting: Ophthalmology

## 2012-03-07 DIAGNOSIS — I10 Essential (primary) hypertension: Secondary | ICD-10-CM

## 2012-03-07 DIAGNOSIS — E1149 Type 2 diabetes mellitus with other diabetic neurological complication: Secondary | ICD-10-CM | POA: Diagnosis not present

## 2012-03-07 DIAGNOSIS — H35039 Hypertensive retinopathy, unspecified eye: Secondary | ICD-10-CM | POA: Diagnosis not present

## 2012-03-07 DIAGNOSIS — H35059 Retinal neovascularization, unspecified, unspecified eye: Secondary | ICD-10-CM

## 2012-03-07 DIAGNOSIS — H2510 Age-related nuclear cataract, unspecified eye: Secondary | ICD-10-CM

## 2012-03-07 DIAGNOSIS — H43819 Vitreous degeneration, unspecified eye: Secondary | ICD-10-CM | POA: Diagnosis not present

## 2012-03-07 DIAGNOSIS — R748 Abnormal levels of other serum enzymes: Secondary | ICD-10-CM | POA: Diagnosis not present

## 2012-03-07 DIAGNOSIS — E1142 Type 2 diabetes mellitus with diabetic polyneuropathy: Secondary | ICD-10-CM | POA: Diagnosis not present

## 2012-03-07 DIAGNOSIS — R252 Cramp and spasm: Secondary | ICD-10-CM | POA: Diagnosis not present

## 2012-03-07 DIAGNOSIS — R609 Edema, unspecified: Secondary | ICD-10-CM | POA: Diagnosis not present

## 2012-03-07 DIAGNOSIS — H348392 Tributary (branch) retinal vein occlusion, unspecified eye, stable: Secondary | ICD-10-CM | POA: Diagnosis not present

## 2012-03-07 DIAGNOSIS — I2699 Other pulmonary embolism without acute cor pulmonale: Secondary | ICD-10-CM | POA: Diagnosis not present

## 2012-03-07 DIAGNOSIS — E039 Hypothyroidism, unspecified: Secondary | ICD-10-CM | POA: Diagnosis not present

## 2012-03-07 DIAGNOSIS — Z7901 Long term (current) use of anticoagulants: Secondary | ICD-10-CM | POA: Diagnosis not present

## 2012-03-08 DIAGNOSIS — M5137 Other intervertebral disc degeneration, lumbosacral region: Secondary | ICD-10-CM | POA: Diagnosis not present

## 2012-03-08 DIAGNOSIS — M545 Low back pain: Secondary | ICD-10-CM | POA: Diagnosis not present

## 2012-03-15 ENCOUNTER — Telehealth: Payer: Self-pay | Admitting: Oncology

## 2012-03-15 NOTE — Telephone Encounter (Signed)
Moved 8/28 appt to 10/1 - s/w pt she is aware.

## 2012-03-20 ENCOUNTER — Ambulatory Visit: Payer: Medicare Other | Admitting: Internal Medicine

## 2012-03-22 DIAGNOSIS — M5137 Other intervertebral disc degeneration, lumbosacral region: Secondary | ICD-10-CM | POA: Diagnosis not present

## 2012-03-22 DIAGNOSIS — M4310 Spondylolisthesis, site unspecified: Secondary | ICD-10-CM | POA: Diagnosis not present

## 2012-03-27 ENCOUNTER — Ambulatory Visit: Payer: Medicare Other | Admitting: Oncology

## 2012-03-27 ENCOUNTER — Other Ambulatory Visit: Payer: Medicare Other | Admitting: Lab

## 2012-03-28 DIAGNOSIS — M5137 Other intervertebral disc degeneration, lumbosacral region: Secondary | ICD-10-CM | POA: Diagnosis not present

## 2012-03-28 DIAGNOSIS — M545 Low back pain: Secondary | ICD-10-CM | POA: Diagnosis not present

## 2012-04-02 DIAGNOSIS — E039 Hypothyroidism, unspecified: Secondary | ICD-10-CM | POA: Diagnosis not present

## 2012-04-02 DIAGNOSIS — G609 Hereditary and idiopathic neuropathy, unspecified: Secondary | ICD-10-CM | POA: Diagnosis not present

## 2012-04-02 DIAGNOSIS — R1011 Right upper quadrant pain: Secondary | ICD-10-CM | POA: Diagnosis not present

## 2012-04-02 DIAGNOSIS — Z7901 Long term (current) use of anticoagulants: Secondary | ICD-10-CM | POA: Diagnosis not present

## 2012-04-04 DIAGNOSIS — R252 Cramp and spasm: Secondary | ICD-10-CM | POA: Diagnosis not present

## 2012-04-04 DIAGNOSIS — E1142 Type 2 diabetes mellitus with diabetic polyneuropathy: Secondary | ICD-10-CM | POA: Diagnosis not present

## 2012-04-04 DIAGNOSIS — E039 Hypothyroidism, unspecified: Secondary | ICD-10-CM | POA: Diagnosis not present

## 2012-04-04 DIAGNOSIS — R609 Edema, unspecified: Secondary | ICD-10-CM | POA: Diagnosis not present

## 2012-04-04 DIAGNOSIS — E162 Hypoglycemia, unspecified: Secondary | ICD-10-CM | POA: Diagnosis not present

## 2012-04-04 DIAGNOSIS — E1149 Type 2 diabetes mellitus with other diabetic neurological complication: Secondary | ICD-10-CM | POA: Diagnosis not present

## 2012-04-08 ENCOUNTER — Encounter (INDEPENDENT_AMBULATORY_CARE_PROVIDER_SITE_OTHER): Payer: Self-pay | Admitting: Surgery

## 2012-04-08 ENCOUNTER — Ambulatory Visit (INDEPENDENT_AMBULATORY_CARE_PROVIDER_SITE_OTHER): Payer: Medicare Other | Admitting: Surgery

## 2012-04-08 ENCOUNTER — Telehealth (INDEPENDENT_AMBULATORY_CARE_PROVIDER_SITE_OTHER): Payer: Self-pay | Admitting: General Surgery

## 2012-04-08 VITALS — BP 132/84 | HR 64 | Temp 97.6°F | Resp 16 | Ht 69.0 in | Wt 222.4 lb

## 2012-04-08 DIAGNOSIS — K8020 Calculus of gallbladder without cholecystitis without obstruction: Secondary | ICD-10-CM | POA: Diagnosis not present

## 2012-04-08 NOTE — Patient Instructions (Signed)

## 2012-04-08 NOTE — Telephone Encounter (Signed)
April from Dr. Remus Blake office called to give [place] the okay to do a lovenox bridge for this pt before surgery.

## 2012-04-08 NOTE — Telephone Encounter (Signed)
LMOM for April, Dr. Remus Blake CMA, and explained that we are getting ready to set this pt up for surgery and since they manage her coumadin that we will need her to stop it 5 days before surgery and start a lovenox bridge.

## 2012-04-08 NOTE — Progress Notes (Signed)
Chief Complaint:  Symptomatic gallstones  History of Present Illness:  Kelli Martinez is an 66 y.o. female who is an advanced breast cancer survivor and has gallstones.  I initially saw her regarding these a few years back and we were groin to manage him expectantly. Since that time she has had a Roux-en-Y gastric bypass and now is having problems with right-sided abdominal pain postprandially the Dr. Madilyn Fireman thinks is related to her gallbladder. She has gallstones by ultrasound.  Informed consent was obtained regarding removing her gallbladder with laparoscopic or open if necessary. Gallbladder book was given. We will need to stop her Coumadin about 5-7 days preop and bridge her with Lovenox. She has a [place] filter in place and history of DVT and pulmonary embolism.  Past Medical History  Diagnosis Date  . Allergy   . Asthma   . OSA (obstructive sleep apnea)   . Pulmonary embolism   . HX: breast cancer   . Blind right eye     hemorrhage  . Hypothyroid   . Clotting disorder   . Hypertension   . Diabetes mellitus   . Blood transfusion   . Cancer   . Morbid obesity   . Syncope and collapse   . Arthritis   . GERD (gastroesophageal reflux disease)   . Hyperlipidemia   . Osteoporosis     Past Surgical History  Procedure Date  . Abdominal hysterectomy   . Mastectomy     Right  . Gastric bypass 2011    bariatric surgery  . Ivc filter     recurrent DVT  . Total knee arthroplasty   . Bone marrow transplant   . Cardiovascular stress test 05/23/2005    EF 53%  . US echocardiography 10/27/08    EF 55-60%    Current Outpatient Prescriptions  Medication Sig Dispense Refill  . albuterol (PROAIR HFA) 108 (90 BASE) MCG/ACT inhaler Inhale 2 puffs into the lungs 4 (four) times daily as needed for wheezing or shortness of breath. As needed rescue inhaler  3 Inhaler  3  . atorvastatin (LIPITOR) 20 MG tablet Take 20 mg by mouth daily.      . ergocalciferol (VITAMIN D2) 50000 UNITS  capsule Take 50,000 Units by mouth once a week.        . ferrous sulfate 325 (65 FE) MG tablet Take 325 mg by mouth daily.        . fluticasone (FLONASE) 50 MCG/ACT nasal spray Place 2 sprays into the nose daily.  48 g  3  . Fluticasone-Salmeterol (ADVAIR DISKUS) 100-50 MCG/DOSE AEPB Inhale 1 puff into the lungs every 12 (twelve) hours.  180 each  3  . furosemide (LASIX) 40 MG tablet Take 40 mg by mouth daily.      Marland Kitchen gabapentin (NEURONTIN) 300 MG capsule Take 300 mg by mouth 3 (three) times daily.       Marland Kitchen HYDROcodone-acetaminophen (VICODIN) 5-500 MG per tablet Take 1 tablet by mouth every 6 (six) hours as needed.        Marland Kitchen levothyroxine (SYNTHROID, LEVOTHROID) 125 MCG tablet Take 125 mcg by mouth daily.        . magnesium chloride (SLOW-MAG) 64 MG TBEC Take 1 tablet by mouth 2 (two) times daily.        . NON FORMULARY CPAP 6 [facility]       . omeprazole (PRILOSEC) 20 MG capsule Take 20 mg by mouth daily.        . potassium chloride SA (  K-DUR,KLOR-CON) 20 MEQ tablet Take 20 mEq by mouth daily.       . predniSONE (DELTASONE) 10 MG tablet take 4 x 2 days, 3 x 2 days, 2 x 2 days, 1 x 2 days, then stop  20 tablet  0  . verapamil (CALAN) 120 MG tablet Take 120 mg by mouth 3 (three) times daily.      . vitamin B-12 (CYANOCOBALAMIN) 500 MCG tablet Take 500 mcg by mouth daily.        Marland Kitchen warfarin (COUMADIN) 6 MG tablet Take 6 mg by mouth daily. Take 10 mg on Sun, Wed and Fri ; 12 mg other 4 days       Review of patient's allergies indicates no known allergies. Family History  Problem Relation Age of Onset  . Sudden death Mother     car accident  . Cancer Father 90    lung  . Cancer Sister 80    lung cancer  . Asthma Daughter    Social History:   reports that she quit smoking about 33 years ago. Her smoking use included Cigarettes. She has a 5 pack-year smoking history. She does not have any smokeless tobacco history on file. She reports that she does not drink alcohol or use illicit drugs.   REVIEW  OF SYSTEMS - PERTINENT POSITIVES ONLY: negative  Physical Exam:   Blood pressure 132/84, pulse 64, temperature 97.6 F (36.4 C), temperature source Temporal, resp. rate 16, height 5\' 9"  (1.753 m), weight 222 lb 6.4 oz (100.88 kg). Body mass index is 32.84 kg/(m^2).  Gen:  WDWN AAF NAD  Neurological: Alert and oriented to person, place, and time. Motor and sensory function is grossly intact  Head: Normocephalic and atraumatic.  Eyes: Conjunctivae are normal. Pupils are equal, round, and reactive to light. No scleral icterus.  Neck: Normal range of motion. Neck supple. No tracheal deviation or thyromegaly present.  Cardiovascular:  SR without murmurs or gallops.  No carotid bruits Respiratory: Effort normal.  No respiratory distress. No chest wall tenderness. Breath sounds normal.  No wheezes, rales or rhonchi.  Abdomen:  nontender at this point GU: Musculoskeletal: Normal range of motion. Extremities are nontender. No cyanosis, edema or clubbing noted Lymphadenopathy: No cervical, preauricular, postauricular or axillary adenopathy is present Skin: Skin is warm and dry. No rash noted. No diaphoresis. No erythema. No pallor. Pscyh: Normal mood and affect. Behavior is normal. Judgment and thought content normal.   LABORATORY RESULTS: No results found for this or any previous visit (from the past 48 hour(s)).  RADIOLOGY RESULTS: No results found.  Problem List: Patient Active Problem List  Diagnosis  . PULMONARY EMBOLISM  . ALLERGIC RHINITIS  . ASTHMA  . SLEEP APNEA  . BREAST CANCER, HX OF  . Left leg paresthesias  . Hypothyroid  . Low back pain  . Hypertension  . Morbid obesity  . Syncope and collapse  . Unspecified deficiency anemia  . Roux Y Gastric Bypass April 2011; repair [facility]    Assessment & Plan: Gallstones-symptomatic-schedule cholecystectomy Hypoglycemia after eating (probably related to large volumes of sweet fruit that she is eating)      Matt B. [name], MD,  [facility] [phone] beeper [phone]  04/08/2012 9:39 AM

## 2012-04-19 DIAGNOSIS — M545 Low back pain: Secondary | ICD-10-CM | POA: Diagnosis not present

## 2012-04-19 DIAGNOSIS — M47817 Spondylosis without myelopathy or radiculopathy, lumbosacral region: Secondary | ICD-10-CM | POA: Diagnosis not present

## 2012-04-19 DIAGNOSIS — M5137 Other intervertebral disc degeneration, lumbosacral region: Secondary | ICD-10-CM | POA: Diagnosis not present

## 2012-04-23 ENCOUNTER — Encounter (HOSPITAL_COMMUNITY): Payer: Self-pay | Admitting: Pharmacy Technician

## 2012-04-23 ENCOUNTER — Ambulatory Visit (INDEPENDENT_AMBULATORY_CARE_PROVIDER_SITE_OTHER): Payer: Medicare Other | Admitting: Internal Medicine

## 2012-04-23 ENCOUNTER — Encounter: Payer: Self-pay | Admitting: Internal Medicine

## 2012-04-23 VITALS — BP 128/72 | HR 62 | Ht 69.0 in | Wt 226.4 lb

## 2012-04-23 DIAGNOSIS — J45909 Unspecified asthma, uncomplicated: Secondary | ICD-10-CM | POA: Diagnosis not present

## 2012-04-23 DIAGNOSIS — G4733 Obstructive sleep apnea (adult) (pediatric): Secondary | ICD-10-CM | POA: Diagnosis not present

## 2012-04-23 DIAGNOSIS — Z23 Encounter for immunization: Secondary | ICD-10-CM

## 2012-04-23 MED ORDER — FLUTICASONE PROPIONATE 50 MCG/ACT NA SUSP: 2.0000 | Freq: Every day | NASAL | Status: DC

## 2012-04-23 MED ORDER — ALBUTEROL SULFATE HFA 108 (90 BASE) MCG/ACT IN AERS: 2.0000 | INHALATION_SPRAY | Freq: Four times a day (QID) | RESPIRATORY_TRACT | Status: DC | PRN

## 2012-04-23 MED ORDER — FLUTICASONE-SALMETEROL 100-50 MCG/DOSE IN AEPB: 1.0000 | INHALATION_SPRAY | Freq: Two times a day (BID) | RESPIRATORY_TRACT | Status: DC

## 2012-04-23 NOTE — Patient Instructions (Addendum)
Scripts for flonase, albuterol rescue inhaler and Advair were sent  Flu vax  Ask [facility] how to adjust the humidifier on your CPAP to set it higher because of your dry mouth.  Ok to continue CPAP at 6/ [facility]  Ask at your drug store for Biotene or something similar to treat dry mouth. Try using it at bedtime and as needed.

## 2012-04-23 NOTE — Progress Notes (Signed)
Subjective:    Patient ID: Kelli Martinez, female    DOB: [birth year], 66 y.o.   MRN: [id number]  HPI 03/21/11- 51 yoF former smoker followed for OSA, allergic rhinitis,asthma,  hx pulm embolism/DVT/filter/coumadin(Dr Lucianne Muss), hx R breast Ca/ mastectomy/ xrt/chem/bonemarrow transplant at [facility], hx gastric bypass Last here March 22, 2010 Uses CPAP 6 [facility]- most of each night. Averages 6 hrs sleep/ night. Pressure seems right. Wakes in AM with dry mouth. Sleeps alone.  Wakes with dry mouth and stuffy nose.  Continues Advair. Dry cough can lead to asthma. Needs rescue inhaler refilled.- needed it daily while working summer garden.   04/23/12- 66 yoF former smoker followed for OSA, allergic rhinitis,asthma,  hx pulm embolism/DVT/filter/coumadin(Dr Lucianne Muss), hx R breast Ca/ mastectomy/ xrt/chem/bonemarrow transplant at [facility], hx gastric bypass Wakes up in mid of night gasping for air if not wearing CPAP; When she wears CPAP 6/ [facility] about 3-4 hours each night-nostrils and throat get too dry so unable to wear for longer periods of time; pressure seems to work well for patient.  Review of Systems Constitutional:   No-   weight loss, night sweats, fevers, chills, fatigue, lassitude. HEENT:   No-  headaches, difficulty swallowing, tooth/dental problems, sore throat,   +dry mouth      No-  sneezing, itching, ear ache,              +nasal congestion, post nasal drip,  CV:  No-   chest pain, orthopnea, PND, swelling in lower extremities, anasarca, dizziness, palpitations Resp: No-   shortness of breath with exertion or at rest.              No-   productive cough,  No non-productive cough,  No-  coughing up of blood.              No-   change in color of mucus.   Skin: No-   rash or lesions. GI:  No-   heartburn, indigestion, abdominal pain, nausea, vomiting,  GU:  MS:  No-   joint pain,  +  swelling.   Neuro- :  Psych:  No- change in mood or affect. No depression or anxiety.  No memory loss.     Objective:   Physical Exam General- Alert, Oriented, Affect-appropriate, Distress- none acute  obese Skin- rash-none, lesions- none, excoriation- none. XRT skin changes right lateral neck Lymphadenopathy- none Head- atraumatic            Eyes- Gross vision intact, PERRLA, conjunctivae clear secretions            Ears- Hearing, canals normal            Nose- Clear, no- Septal dev, + sticky mucus, polyps, erosion, perforation             Throat- Mallampati II , mucosa clear , drainage- none, tonsils- atrophic Neck- flexible , trachea midline, no stridor , thyroid nl, carotid no bruit Chest - symmetrical excursion , unlabored           Heart/CV- RRR , no murmur , no gallop  , no rub, nl s1 s2                           - JVD- none , edema- none, stasis changes- none, varices- none           Lung- clear to P&A, wheeze- none, cough- none , dullness-none, rub- none  Chest wall-  Abd- tender-no, distended-no, bowel sounds-present, HSM- no Br/ Gen/ Rectal- Not done, not indicated Extrem- cyanosis- none, clubbing, none, atrophy- none, strength- nl   Heavy legs. +Lymphedema right forearm Neuro- grossly intact to observation  Assessment & Plan:

## 2012-04-30 ENCOUNTER — Other Ambulatory Visit (HOSPITAL_BASED_OUTPATIENT_CLINIC_OR_DEPARTMENT_OTHER): Payer: Medicare Other | Admitting: Lab

## 2012-04-30 ENCOUNTER — Telehealth: Payer: Self-pay | Admitting: Oncology

## 2012-04-30 ENCOUNTER — Ambulatory Visit (HOSPITAL_BASED_OUTPATIENT_CLINIC_OR_DEPARTMENT_OTHER): Payer: Medicare Other | Admitting: Oncology

## 2012-04-30 VITALS — BP 121/70 | HR 53 | Temp 98.5°F | Resp 18 | Ht 69.0 in | Wt 229.1 lb

## 2012-04-30 DIAGNOSIS — Z853 Personal history of malignant neoplasm of breast: Secondary | ICD-10-CM | POA: Diagnosis not present

## 2012-04-30 DIAGNOSIS — Z86718 Personal history of other venous thrombosis and embolism: Secondary | ICD-10-CM | POA: Diagnosis not present

## 2012-04-30 DIAGNOSIS — D649 Anemia, unspecified: Secondary | ICD-10-CM | POA: Diagnosis not present

## 2012-04-30 DIAGNOSIS — N289 Disorder of kidney and ureter, unspecified: Secondary | ICD-10-CM

## 2012-04-30 LAB — COMPREHENSIVE METABOLIC PANEL (CC13)
ALT: 152 U/L — ABNORMAL HIGH (ref 0–55)
Albumin: 3.5 g/dL (ref 3.5–5.0)
Alkaline Phosphatase: 79 U/L (ref 40–150)
CO2: 29 mEq/L (ref 22–29)
Glucose: 99 mg/dl (ref 70–99)
Potassium: 4.8 mEq/L (ref 3.5–5.1)
Sodium: 138 mEq/L (ref 136–145)
Total Bilirubin: 0.5 mg/dL (ref 0.20–1.20)
Total Protein: 6.3 g/dL — ABNORMAL LOW (ref 6.4–8.3)

## 2012-04-30 LAB — CBC WITH DIFFERENTIAL/PLATELET
Basophils Absolute: 0.1 10*3/uL (ref 0.0–0.1)
EOS%: 0.4 % (ref 0.0–7.0)
Eosinophils Absolute: 0 10*3/uL (ref 0.0–0.5)
HCT: 37.7 % (ref 34.8–46.6)
HGB: 12.4 g/dL (ref 11.6–15.9)
MCH: 29.7 pg (ref 25.1–34.0)
MCV: 90.1 fL (ref 79.5–101.0)
MONO%: 6.4 % (ref 0.0–14.0)
NEUT#: 3.4 10*3/uL (ref 1.5–6.5)
NEUT%: 60.9 % (ref 38.4–76.8)
Platelets: 162 10*3/uL (ref 145–400)

## 2012-04-30 LAB — IRON AND TIBC
%SAT: 25 % (ref 20–55)
UIBC: 261 ug/dL (ref 125–400)

## 2012-04-30 NOTE — Progress Notes (Signed)
Hematology and Oncology Follow Up Visit  Kelli Martinez [id number] 29-Jan-1946 66 y.o. 04/30/2012 8:45 AM  CC: Kelli Martinez, M.D.  [place] Kelli Deutscher, MD    Principle Diagnosis:  This is a 66 year old female with the following issues: 1. Multifactorial anemia.  She has an element of iron deficiency, as well as anemia of renal disease, currently on p.o. iron replacement, and has not really required any IV iron recently. 2. History of breast cancer diagnosed in 1993.  She is status post mastectomy followed by adjuvant radiation therapy.  She had also received high-dose chemotherapy and stem-cell transplantation in 1994.  She continues to be in remission from that standpoint.   3. History of gastric bypass surgery done in April 2011.   Interim History: [name] presents today for a followup visit.  She has reported a few symptoms since the last time I saw her.  She is reporting slight fatigue but overall her energy is reasonable and she does not experience any hypoglycemic episodes.  She does not report any chest pain; does not report any difficulty breathing.  For the most part her performance status and activity level remain reasonable.  Her weight, as mentioned, continues to drop after her gastric bypass procedure. She has lost over 100 lbs. She is having episodes of low blood sugars at time.    Medications: I have reviewed the patient's current medications. Current outpatient prescriptions:albuterol (PROVENTIL HFA;VENTOLIN HFA) 108 (90 BASE) MCG/ACT inhaler, Inhale 2 puffs into the lungs 4 (four) times daily as needed. As needed rescue inhaler, Disp: , Rfl: ;  atorvastatin (LIPITOR) 20 MG tablet, Take 20 mg by mouth every morning. , Disp: , Rfl: ;  ergocalciferol (VITAMIN D2) 50000 UNITS capsule, Take 50,000 Units by mouth every Friday. , Disp: , Rfl:  ferrous sulfate 325 (65 FE) MG tablet, Take 325 mg by mouth daily.  , Disp: , Rfl: ;  fluticasone (FLONASE) 50 MCG/ACT nasal spray, Place 2  sprays into the nose daily., Disp: , Rfl: ;  Fluticasone-Salmeterol (ADVAIR) 100-50 MCG/DOSE AEPB, Inhale 1 puff into the lungs every 12 (twelve) hours. Rinse mouth, Disp: , Rfl: ;  furosemide (LASIX) 40 MG tablet, Take 40 mg by mouth every morning. , Disp: , Rfl:  gabapentin (NEURONTIN) 300 MG capsule, Take 300 mg by mouth 4 (four) times daily. , Disp: , Rfl: ;  HYDROcodone-acetaminophen (VICODIN) 5-500 MG per tablet, Take 1 tablet by mouth every 6 (six) hours as needed. Pain, Disp: , Rfl: ;  levothyroxine (SYNTHROID, LEVOTHROID) 125 MCG tablet, Take 125 mcg by mouth daily before breakfast. , Disp: , Rfl: ;  magnesium chloride (SLOW-MAG) 64 MG TBEC, Take 1 tablet by mouth 2 (two) times daily. , Disp: , Rfl:  methylcellulose (ARTIFICIAL TEARS) 1 % ophthalmic solution, Place 1 drop into both eyes as needed. Dry eyes, Disp: , Rfl: ;  NON FORMULARY, CPAP 6 [employer] , Disp: , Rfl: ;  omeprazole (PRILOSEC) 20 MG capsule, Take 20 mg by mouth daily.  , Disp: , Rfl: ;  potassium chloride SA (K-DUR,KLOR-CON) 20 MEQ tablet, Take 20 mEq by mouth daily. , Disp: , Rfl: ;  verapamil (CALAN) 120 MG tablet, Take 120 mg by mouth every morning. , Disp: , Rfl:  vitamin B-12 (CYANOCOBALAMIN) 500 MCG tablet, Take 500 mcg by mouth daily.  , Disp: , Rfl: ;  warfarin (COUMADIN) 10 MG tablet, Take 10 mg by mouth daily. Pt takes 1 tablet 10 mg on Monday and Wednesday.Pt take 12.5 mg on  Sunday, Tuesday, Thursday, Friday, and Saturday. Pt takes 1 whole 10 mg and 0.5 tablet of 5 mg., Disp: , Rfl:  warfarin (COUMADIN) 5 MG tablet, Take 2.5 mg by mouth daily. Pt take 12.5 mg on Sunday, Tuesday, Thursday, Friday, and Saturday. Pt takes 1 whole 10 mg and 0.5 tablet of 5 mg., Disp: , Rfl:   Allergies:  Allergies  Allergen Reactions  . Adhesive (Tape) Rash    Past Medical History, Surgical history, Social history, and Family History were reviewed and updated.  Review of Systems: Constitutional:  Negative for fever, chills, night sweats,  anorexia, weight loss, pain. Cardiovascular: no chest pain or dyspnea on exertion Respiratory: no cough, shortness of breath, or wheezing Neurological: negative Dermatological: negative ENT: negative Skin: Negative. Gastrointestinal: no abdominal pain, change in bowel habits, or black or bloody stools Genito-Urinary: negative Hematological and Lymphatic: negative Breast: negative Musculoskeletal: negative Remaining ROS negative. Physical Exam: Blood pressure 121/70, pulse 53, temperature 98.5 F (36.9 C), temperature source Oral, resp. rate 18, height 5\' 9"  (1.753 m), weight 229 lb 1.6 oz (103.919 kg). ECOG: 1 General appearance: alert Head: Normocephalic, without obvious abnormality, atraumatic Neck: no adenopathy, no carotid bruit, no JVD, supple, symmetrical, trachea midline and thyroid not enlarged, symmetric, no tenderness/mass/nodules Lymph nodes: Cervical, supraclavicular, and axillary nodes normal. Heart:regular rate and rhythm, S1, S2 normal, no murmur, click, rub or gallop Lung:chest clear, no wheezing, rales, normal symmetric air entry Abdomin: soft, non-tender, without masses or organomegaly EXT:no erythema, induration, or nodules   Lab Results: Lab Results  Component Value Date   WBC 5.5 04/30/2012   HGB 12.4 04/30/2012   HCT 37.7 04/30/2012   MCV 90.1 04/30/2012   PLT 162 04/30/2012     Chemistry      Component Value Date/Time   NA 144 09/28/2011 0902   K 3.6 09/28/2011 0902   CL 106 09/28/2011 0902   CO2 30 09/28/2011 0902   BUN 12 09/28/2011 0902   CREATININE 0.86 09/28/2011 0902      Component Value Date/Time   CALCIUM 9.1 09/28/2011 0902   ALKPHOS 91 09/28/2011 0902   AST 42* 09/28/2011 0902   ALT 56* 09/28/2011 0902   BILITOT 0.3 09/28/2011 0902        Impression and Plan: This is a 66 year old female with the following issues: 1. Multifactorial anemia.  This is predominately anemia of iron deficiency.  She is on iron replacement and continues to do quite  well.  Her hemoglobin is excellent.  I am rechecking her iron stores, also checking B12 and folic acid.  Given her gastric bypass surgery she is at risk of having nutritional deficiencies and it is paramount to continue to monitor those at this time.  2. History of breast cancer.  She has no evidence of any recurrence at this time.  She is up to speed on her mammogram and physical examination.  She does have a calcified small nodule at the incision site; again, it appeared to be more of a sebaceous cyst.  However, malignancy cannot be ruled out.  I will monitor it closely.  I told her if it grows then it would certainly be amenable for a biopsy.  Again, she is about 20 years out from her breast cancer.  I think it is less likely, but it always a possibility at this time. 3. History of deep vein thrombosis, currently anticoagulated with Coumadin.  Followup will be  In 6 months.    SHADAD,FIRAS, MD 10/1/20138:45 AM

## 2012-04-30 NOTE — Telephone Encounter (Signed)
appts made and pprinted for pt aom

## 2012-05-02 DIAGNOSIS — E7800 Pure hypercholesterolemia, unspecified: Secondary | ICD-10-CM | POA: Diagnosis not present

## 2012-05-02 DIAGNOSIS — R609 Edema, unspecified: Secondary | ICD-10-CM | POA: Diagnosis not present

## 2012-05-02 DIAGNOSIS — Z7901 Long term (current) use of anticoagulants: Secondary | ICD-10-CM | POA: Diagnosis not present

## 2012-05-03 ENCOUNTER — Telehealth (INDEPENDENT_AMBULATORY_CARE_PROVIDER_SITE_OTHER): Payer: Self-pay

## 2012-05-03 NOTE — Telephone Encounter (Signed)
The patient called inquiring about her Coumadin and when to stop it before surgery.  She thinks she is supposed to get Heparin preop.  I paged Dr [name].  He called back while I was at lunch and spoke to [place].  She needs to stop her Coumadin 5 days before surgery and start a Lovenox bridge. She needs a Lovenox kit.  He asked why Dr Lucianne Muss was not managing this.  I called their office and spoke to April who will ask Dr Lucianne Muss.

## 2012-05-03 NOTE — Telephone Encounter (Signed)
I called to check with April on the Lovenox bridge.  April is gone for the day. [place] got on the phone and checked on it.  She says they are waiting to hear back from Dr Lucianne Muss.  He is off this afternoon.  He may or may not answer back to the message.  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  I gave her our back door phone # in case it's after 5pm.

## 2012-05-03 NOTE — Assessment & Plan Note (Signed)
Adequately controlled. Plan-flu vaccine

## 2012-05-03 NOTE — Assessment & Plan Note (Signed)
We reviewed comfort issues again, seeking improved ability to wear CPAP all night. Plan-Biotene

## 2012-05-03 NOTE — Telephone Encounter (Signed)
We did not hear back from Dr Remus Blake office.  I spoke to Dr [name]. He advised to call in a Lovenox starter kit for the patient, have her stop her Coumadin Sunday and then check with Dr Lucianne Muss Monday for the dosing.  I called multiple pharmacies and they do not carry the started kits.  I called the patient to tell her when to stop her coumadin and she had already been called by [place] at Dr Remus Blake.  Another md told her to stop her Coumadin Sunday (no pill that day) and wait to hear from Dr Lucianne Muss Monday or Tuesday.  I told her I will check with her Monday and make sure they follow through.

## 2012-05-06 DIAGNOSIS — Z7901 Long term (current) use of anticoagulants: Secondary | ICD-10-CM | POA: Diagnosis not present

## 2012-05-06 DIAGNOSIS — E7800 Pure hypercholesterolemia, unspecified: Secondary | ICD-10-CM | POA: Diagnosis not present

## 2012-05-06 DIAGNOSIS — E119 Type 2 diabetes mellitus without complications: Secondary | ICD-10-CM | POA: Diagnosis not present

## 2012-05-06 DIAGNOSIS — E162 Hypoglycemia, unspecified: Secondary | ICD-10-CM | POA: Diagnosis not present

## 2012-05-06 DIAGNOSIS — R748 Abnormal levels of other serum enzymes: Secondary | ICD-10-CM | POA: Diagnosis not present

## 2012-05-06 NOTE — Telephone Encounter (Signed)
I received a call from April.  She said Dr Lucianne Muss will manage the bridge.  They will prescribe Lovenox 60mg  bid and then 30mg  on 10/10.  She will have no Coumadin this week.

## 2012-05-07 ENCOUNTER — Ambulatory Visit (HOSPITAL_COMMUNITY)
Admission: RE | Admit: 2012-05-07 | Discharge: 2012-05-07 | Disposition: A | Discharge status: Home / Self Care | Payer: Medicare Other | Source: Ambulatory Visit | Attending: Surgery | Admitting: Surgery

## 2012-05-07 ENCOUNTER — Encounter (HOSPITAL_COMMUNITY)
Admission: RE | Admit: 2012-05-07 | Discharge: 2012-05-07 | Disposition: A | Discharge status: Home / Self Care | Payer: Medicare Other | Source: Ambulatory Visit | Attending: Surgery | Admitting: Surgery

## 2012-05-07 ENCOUNTER — Encounter (HOSPITAL_COMMUNITY): Payer: Self-pay

## 2012-05-07 DIAGNOSIS — K8020 Calculus of gallbladder without cholecystitis without obstruction: Secondary | ICD-10-CM | POA: Insufficient documentation

## 2012-05-07 DIAGNOSIS — Z01812 Encounter for preprocedural laboratory examination: Secondary | ICD-10-CM | POA: Insufficient documentation

## 2012-05-07 DIAGNOSIS — Z01810 Encounter for preprocedural cardiovascular examination: Secondary | ICD-10-CM | POA: Insufficient documentation

## 2012-05-07 DIAGNOSIS — Z01818 Encounter for other preprocedural examination: Secondary | ICD-10-CM | POA: Diagnosis not present

## 2012-05-07 HISTORY — DX: Lymphedema, not elsewhere classified: I89.0

## 2012-05-07 HISTORY — DX: Psoriasis, unspecified: L40.9

## 2012-05-07 HISTORY — DX: Other chronic pain: G89.29

## 2012-05-07 HISTORY — DX: Abnormal levels of other serum enzymes: R74.8

## 2012-05-07 HISTORY — DX: Calculus of gallbladder without cholecystitis without obstruction: K80.20

## 2012-05-07 HISTORY — DX: Anemia, unspecified: D64.9

## 2012-05-07 HISTORY — DX: Dorsalgia, unspecified: M54.9

## 2012-05-07 LAB — COMPREHENSIVE METABOLIC PANEL
ALT: 144 U/L — ABNORMAL HIGH (ref 0–35)
Alkaline Phosphatase: 74 U/L (ref 39–117)
BUN: 15 mg/dL (ref 6–23)
CO2: 33 mEq/L — ABNORMAL HIGH (ref 19–32)
Chloride: 100 mEq/L (ref 96–112)
GFR calc Af Amer: 87 mL/min — ABNORMAL LOW (ref >90)
Glucose, Bld: 85 mg/dL (ref 70–99)
Potassium: 3.8 mEq/L (ref 3.5–5.1)
Sodium: 141 mEq/L (ref 135–145)
Total Bilirubin: 0.5 mg/dL (ref 0.3–1.2)
Total Protein: 6.8 g/dL (ref 6.0–8.3)

## 2012-05-07 LAB — SURGICAL PCR SCREEN
MRSA, PCR: NEGATIVE
Staphylococcus aureus: NEGATIVE

## 2012-05-07 LAB — CBC
HCT: 38.9 % (ref 36.0–46.0)
Hemoglobin: 12.6 g/dL (ref 12.0–15.0)
MCHC: 32.4 g/dL (ref 30.0–36.0)
RBC: 4.36 MIL/uL (ref 3.87–5.11)
WBC: 5 10*3/uL (ref 4.0–10.5)

## 2012-05-07 MED ORDER — CHLORHEXIDINE GLUCONATE 4 % EX LIQD
1.0000 "application " | Freq: Once | CUTANEOUS | Status: DC
  Filled 2012-05-07: qty 15

## 2012-05-07 NOTE — Patient Instructions (Addendum)
Vetra Shinall Goins  05/07/2012                           YOUR PROCEDURE IS SCHEDULED ON:  05/10/12 AT 10:45 AM               PLEASE REPORT TO SHORT STAY CENTER AT :  8:15 AM               CALL THIS NUMBER IF ANY PROBLEMS THE DAY OF SURGERY :               [phone]                      REMEMBER:   Do not eat food or drink liquids AFTER MIDNIGHT   Take these medicines the morning of surgery with A SIP OF WATER: LIPITOR / GABAPENTIN / THYROID / PRILOSEC / VERAPAMIL / USE FLONASE NASAL SPRAY / USE EYE DROPS / USE ADVAIR INHALER / MAY TAKE VICODIN IF NEEDED FOR PAIN   Do not wear jewelry, make-up   Do not wear lotions, powders, or perfumes.   Do not shave legs or underarms 12 hrs. before surgery (men may shave face)  Do not bring valuables to the hospital.  Contacts, dentures or bridgework may not be worn into surgery.  Leave suitcase in the car. After surgery it may be brought to your room.  For patients admitted to the hospital more than one night, checkout time is 11:00                         AM the day of discharge.   Patients discharged the day of surgery will not be allowed to drive home                             If going home same day of surgery, must have someone stay with you first                           4 hrs at home and arrange for some one to drive you home from hospital.    Special Instructions:   Please read over the following fact sheets that you were given:               1. MRSA  INFORMATION                      2. [place] PREPARING FOR SURGERY SHEET                   3. STOP ASPIRIN AND HERBAL MEDICATIONS 7 DAYS PREOP               4. USE FLEET ENEMA THE NIGHT BEFORE SURGERY                                                  X_____________________________________________________________________

## 2012-05-09 MED ORDER — DEXTROSE 5 % IV SOLN
2.0000 g | INTRAVENOUS | Status: AC
  Administered 2012-05-10: 2 g via INTRAVENOUS
  Filled 2012-05-09: qty 2

## 2012-05-10 ENCOUNTER — Encounter (HOSPITAL_COMMUNITY): Payer: Self-pay | Admitting: Anesthesiology

## 2012-05-10 ENCOUNTER — Ambulatory Visit (HOSPITAL_COMMUNITY): Payer: Medicare Other

## 2012-05-10 ENCOUNTER — Ambulatory Visit (HOSPITAL_COMMUNITY): Payer: Medicare Other | Admitting: Anesthesiology

## 2012-05-10 ENCOUNTER — Observation Stay (HOSPITAL_COMMUNITY)
Admission: RE | Admit: 2012-05-10 | Discharge: 2012-05-12 | Disposition: A | Discharge status: Home / Self Care | Payer: Medicare Other | Source: Ambulatory Visit | Attending: Surgery | Admitting: Surgery

## 2012-05-10 ENCOUNTER — Encounter (HOSPITAL_COMMUNITY)
Admission: RE | Disposition: A | Discharge status: Home / Self Care | Payer: Self-pay | Source: Ambulatory Visit | Attending: Surgery

## 2012-05-10 ENCOUNTER — Encounter (HOSPITAL_COMMUNITY): Payer: Self-pay | Admitting: Surgery

## 2012-05-10 ENCOUNTER — Encounter (HOSPITAL_COMMUNITY): Payer: Self-pay

## 2012-05-10 DIAGNOSIS — K8010 Calculus of gallbladder with chronic cholecystitis without obstruction: Secondary | ICD-10-CM | POA: Diagnosis not present

## 2012-05-10 DIAGNOSIS — I10 Essential (primary) hypertension: Secondary | ICD-10-CM | POA: Diagnosis not present

## 2012-05-10 DIAGNOSIS — Z853 Personal history of malignant neoplasm of breast: Secondary | ICD-10-CM | POA: Insufficient documentation

## 2012-05-10 DIAGNOSIS — K8020 Calculus of gallbladder without cholecystitis without obstruction: Secondary | ICD-10-CM | POA: Diagnosis not present

## 2012-05-10 DIAGNOSIS — E039 Hypothyroidism, unspecified: Secondary | ICD-10-CM | POA: Insufficient documentation

## 2012-05-10 DIAGNOSIS — M545 Low back pain, unspecified: Secondary | ICD-10-CM | POA: Diagnosis not present

## 2012-05-10 DIAGNOSIS — G4733 Obstructive sleep apnea (adult) (pediatric): Secondary | ICD-10-CM | POA: Insufficient documentation

## 2012-05-10 DIAGNOSIS — I2699 Other pulmonary embolism without acute cor pulmonale: Secondary | ICD-10-CM

## 2012-05-10 DIAGNOSIS — M810 Age-related osteoporosis without current pathological fracture: Secondary | ICD-10-CM | POA: Diagnosis not present

## 2012-05-10 DIAGNOSIS — Z86711 Personal history of pulmonary embolism: Secondary | ICD-10-CM | POA: Insufficient documentation

## 2012-05-10 DIAGNOSIS — Z90710 Acquired absence of both cervix and uterus: Secondary | ICD-10-CM | POA: Diagnosis not present

## 2012-05-10 DIAGNOSIS — K219 Gastro-esophageal reflux disease without esophagitis: Secondary | ICD-10-CM | POA: Diagnosis not present

## 2012-05-10 DIAGNOSIS — Z9884 Bariatric surgery status: Secondary | ICD-10-CM | POA: Insufficient documentation

## 2012-05-10 DIAGNOSIS — J310 Chronic rhinitis: Secondary | ICD-10-CM | POA: Diagnosis not present

## 2012-05-10 DIAGNOSIS — J45909 Unspecified asthma, uncomplicated: Secondary | ICD-10-CM | POA: Insufficient documentation

## 2012-05-10 DIAGNOSIS — E785 Hyperlipidemia, unspecified: Secondary | ICD-10-CM | POA: Diagnosis not present

## 2012-05-10 DIAGNOSIS — R209 Unspecified disturbances of skin sensation: Secondary | ICD-10-CM | POA: Insufficient documentation

## 2012-05-10 DIAGNOSIS — R55 Syncope and collapse: Secondary | ICD-10-CM | POA: Insufficient documentation

## 2012-05-10 DIAGNOSIS — D649 Anemia, unspecified: Secondary | ICD-10-CM | POA: Insufficient documentation

## 2012-05-10 DIAGNOSIS — E119 Type 2 diabetes mellitus without complications: Secondary | ICD-10-CM | POA: Diagnosis not present

## 2012-05-10 HISTORY — PX: CHOLECYSTECTOMY: SHX55

## 2012-05-10 LAB — CBC
HCT: 37.7 % (ref 36.0–46.0)
Platelets: 149 10*3/uL — ABNORMAL LOW (ref 150–400)
RDW: 15.1 % (ref 11.5–15.5)
WBC: 7.9 10*3/uL (ref 4.0–10.5)

## 2012-05-10 LAB — APTT: aPTT: 29 seconds (ref 24–37)

## 2012-05-10 LAB — CREATININE, SERUM
Creatinine, Ser: 0.67 mg/dL (ref 0.50–1.10)
GFR calc Af Amer: >90 mL/min (ref >90)
GFR calc non Af Amer: >90 mL/min (ref >90)

## 2012-05-10 LAB — GLUCOSE, CAPILLARY: Glucose-Capillary: 81 mg/dL (ref 70–99)

## 2012-05-10 SURGERY — LAPAROSCOPIC CHOLECYSTECTOMY WITH INTRAOPERATIVE CHOLANGIOGRAM
Anesthesia: General | Site: Abdomen | Wound class: Clean Contaminated

## 2012-05-10 MED ORDER — BUPIVACAINE LIPOSOME 1.3 % IJ SUSP
20.0000 mL | Freq: Once | INTRAMUSCULAR | Status: DC
  Filled 2012-05-10: qty 20

## 2012-05-10 MED ORDER — FLUTICASONE PROPIONATE 50 MCG/ACT NA SUSP: 2.0000 | Freq: Every day | NASAL | Status: DC

## 2012-05-10 MED ORDER — FUROSEMIDE 40 MG PO TABS: 40.0000 mg | ORAL_TABLET | Freq: Every morning | ORAL | Status: DC

## 2012-05-10 MED ORDER — SUCCINYLCHOLINE CHLORIDE 20 MG/ML IJ SOLN
INTRAMUSCULAR | Status: DC | PRN
  Administered 2012-05-10: 100 mg via INTRAVENOUS

## 2012-05-10 MED ORDER — GABAPENTIN 300 MG PO CAPS: 300.0000 mg | ORAL_CAPSULE | Freq: Four times a day (QID) | ORAL | Status: DC

## 2012-05-10 MED ORDER — CEFOXITIN SODIUM-DEXTROSE 1-4 GM-% IV SOLR (PREMIX)
INTRAVENOUS | Status: AC
  Filled 2012-05-10: qty 100

## 2012-05-10 MED ORDER — SUFENTANIL CITRATE 50 MCG/ML IV SOLN
INTRAVENOUS | Status: DC | PRN
  Administered 2012-05-10: 10 ug via INTRAVENOUS
  Administered 2012-05-10: 20 ug via INTRAVENOUS

## 2012-05-10 MED ORDER — KCL IN DEXTROSE-NACL 20-5-0.45 MEQ/L-%-% IV SOLN
INTRAVENOUS | Status: DC
  Administered 2012-05-10 – 2012-05-11 (×2): via INTRAVENOUS
  Filled 2012-05-10 (×5): qty 1000

## 2012-05-10 MED ORDER — IOHEXOL 300 MG/ML  SOLN
INTRAMUSCULAR | Status: DC | PRN
  Administered 2012-05-10: 5 mL

## 2012-05-10 MED ORDER — MORPHINE SULFATE 10 MG/ML IJ SOLN
1.0000 mg | INTRAMUSCULAR | Status: DC | PRN
  Administered 2012-05-10: 1 mg via INTRAVENOUS

## 2012-05-10 MED ORDER — PROPOFOL 10 MG/ML IV BOLUS
INTRAVENOUS | Status: DC | PRN
  Administered 2012-05-10: 180 mg via INTRAVENOUS

## 2012-05-10 MED ORDER — GLYCOPYRROLATE 0.2 MG/ML IJ SOLN
INTRAMUSCULAR | Status: DC | PRN
  Administered 2012-05-10: 0.6 mg via INTRAVENOUS

## 2012-05-10 MED ORDER — MORPHINE SULFATE 10 MG/ML IJ SOLN
INTRAMUSCULAR | Status: AC
  Filled 2012-05-10: qty 1

## 2012-05-10 MED ORDER — BUPIVACAINE-EPINEPHRINE 0.25% -1:200000 IJ SOLN
INTRAMUSCULAR | Status: DC | PRN
  Administered 2012-05-10: 20 mL

## 2012-05-10 MED ORDER — ALBUTEROL SULFATE HFA 108 (90 BASE) MCG/ACT IN AERS
2.0000 | INHALATION_SPRAY | Freq: Four times a day (QID) | RESPIRATORY_TRACT | Status: DC | PRN
  Filled 2012-05-10: qty 6.7

## 2012-05-10 MED ORDER — NEOSTIGMINE METHYLSULFATE 1 MG/ML IJ SOLN
INTRAMUSCULAR | Status: DC | PRN
  Administered 2012-05-10: 4 mg via INTRAVENOUS

## 2012-05-10 MED ORDER — LACTATED RINGERS IV SOLN
INTRAVENOUS | Status: DC
Start: 2012-05-10 — End: 2012-05-10

## 2012-05-10 MED ORDER — PANTOPRAZOLE SODIUM 40 MG PO TBEC: 40.0000 mg | DELAYED_RELEASE_TABLET | Freq: Every day | ORAL | Status: DC

## 2012-05-10 MED ORDER — ALBUTEROL SULFATE HFA 108 (90 BASE) MCG/ACT IN AERS: 2.0000 | INHALATION_SPRAY | Freq: Four times a day (QID) | RESPIRATORY_TRACT | Status: DC | PRN

## 2012-05-10 MED ORDER — ONDANSETRON HCL 4 MG/2ML IJ SOLN
4.0000 mg | Freq: Four times a day (QID) | INTRAMUSCULAR | Status: DC | PRN
  Administered 2012-05-10: 4 mg via INTRAVENOUS

## 2012-05-10 MED ORDER — MEPERIDINE HCL 50 MG/ML IJ SOLN: 6.2500 mg | INTRAMUSCULAR | Status: DC | PRN

## 2012-05-10 MED ORDER — OXYCODONE-ACETAMINOPHEN 5-325 MG PO TABS
1.0000 | ORAL_TABLET | ORAL | Status: DC | PRN
  Administered 2012-05-10 – 2012-05-11 (×2): 1 via ORAL
  Administered 2012-05-11 (×2): 2 via ORAL
  Filled 2012-05-10 (×2): qty 1
  Filled 2012-05-10 (×3): qty 2

## 2012-05-10 MED ORDER — CISATRACURIUM BESYLATE (PF) 10 MG/5ML IV SOLN
INTRAVENOUS | Status: DC | PRN
  Administered 2012-05-10: 5 mg via INTRAVENOUS

## 2012-05-10 MED ORDER — MIDAZOLAM HCL 5 MG/5ML IJ SOLN
INTRAMUSCULAR | Status: DC | PRN
  Administered 2012-05-10: 2 mg via INTRAVENOUS

## 2012-05-10 MED ORDER — MORPHINE SULFATE 2 MG/ML IJ SOLN
1.0000 mg | INTRAMUSCULAR | Status: DC | PRN
  Administered 2012-05-10: 1 mg via INTRAVENOUS
  Filled 2012-05-10: qty 1

## 2012-05-10 MED ORDER — FENTANYL CITRATE 0.05 MG/ML IJ SOLN
INTRAMUSCULAR | Status: AC
  Filled 2012-05-10: qty 2

## 2012-05-10 MED ORDER — ONDANSETRON HCL 4 MG/2ML IJ SOLN
INTRAMUSCULAR | Status: DC | PRN
  Administered 2012-05-10: 4 mg via INTRAVENOUS

## 2012-05-10 MED ORDER — FENTANYL CITRATE 0.05 MG/ML IJ SOLN
25.0000 ug | INTRAMUSCULAR | Status: DC | PRN
  Administered 2012-05-10 (×2): 50 ug via INTRAVENOUS

## 2012-05-10 MED ORDER — DEXAMETHASONE SODIUM PHOSPHATE 10 MG/ML IJ SOLN
INTRAMUSCULAR | Status: DC | PRN
  Administered 2012-05-10: 10 mg via INTRAVENOUS

## 2012-05-10 MED ORDER — PROMETHAZINE HCL 25 MG/ML IJ SOLN: 6.2500 mg | INTRAMUSCULAR | Status: DC | PRN

## 2012-05-10 MED ORDER — HEPARIN SODIUM (PORCINE) 5000 UNIT/ML IJ SOLN
5000.0000 [IU] | Freq: Three times a day (TID) | INTRAMUSCULAR | Status: DC
  Administered 2012-05-10 – 2012-05-12 (×6): 5000 [IU] via SUBCUTANEOUS
  Filled 2012-05-10 (×9): qty 1

## 2012-05-10 MED ORDER — ONDANSETRON HCL 4 MG/2ML IJ SOLN
INTRAMUSCULAR | Status: AC
  Filled 2012-05-10: qty 2

## 2012-05-10 MED ORDER — HEPARIN SODIUM (PORCINE) 5000 UNIT/ML IJ SOLN
5000.0000 [IU] | Freq: Once | INTRAMUSCULAR | Status: AC
  Administered 2012-05-10: 5000 [IU] via SUBCUTANEOUS

## 2012-05-10 MED ORDER — VERAPAMIL HCL 120 MG PO TABS: 120.0000 mg | ORAL_TABLET | Freq: Every morning | ORAL | Status: DC

## 2012-05-10 MED ORDER — LACTATED RINGERS IR SOLN
Status: DC | PRN
  Administered 2012-05-10: 1000 mL

## 2012-05-10 MED ORDER — LACTATED RINGERS IV SOLN
INTRAVENOUS | Status: DC
  Administered 2012-05-10: 12:00:00 via INTRAVENOUS
  Administered 2012-05-10: 1000 mL via INTRAVENOUS

## 2012-05-10 MED ORDER — IOHEXOL 300 MG/ML  SOLN
INTRAMUSCULAR | Status: AC
  Filled 2012-05-10: qty 1

## 2012-05-10 MED ORDER — BUPIVACAINE HCL (PF) 0.25 % IJ SOLN
INTRAMUSCULAR | Status: AC
  Filled 2012-05-10: qty 30

## 2012-05-10 MED ORDER — HEPARIN SODIUM (PORCINE) 5000 UNIT/ML IJ SOLN
5000.0000 [IU] | Freq: Once | INTRAMUSCULAR | Status: DC
  Filled 2012-05-10: qty 1

## 2012-05-10 MED ORDER — METHYLCELLULOSE 1 % OP SOLN: 1.0000 [drp] | OPHTHALMIC | Status: DC | PRN

## 2012-05-10 MED ORDER — FLUTICASONE-SALMETEROL 100-50 MCG/DOSE IN AEPB: 1.0000 | INHALATION_SPRAY | Freq: Two times a day (BID) | RESPIRATORY_TRACT | Status: DC

## 2012-05-10 MED ORDER — SODIUM CHLORIDE 0.9 % IR SOLN
Status: DC | PRN
  Administered 2012-05-10: 1000 mL

## 2012-05-10 MED ORDER — BUPIVACAINE-EPINEPHRINE 0.25% -1:200000 IJ SOLN
INTRAMUSCULAR | Status: AC
  Filled 2012-05-10: qty 1

## 2012-05-10 MED ORDER — LEVOTHYROXINE SODIUM 125 MCG PO TABS: 125.0000 ug | ORAL_TABLET | Freq: Every day | ORAL | Status: DC

## 2012-05-10 SURGICAL SUPPLY — 43 items
APL SKNCLS STERI-STRIP NONHPOA (GAUZE/BANDAGES/DRESSINGS) ×1
APPLIER CLIP 5 13 M/L LIGAMAX5 (MISCELLANEOUS)
APPLIER CLIP ROT 10 11.4 M/L (STAPLE)
APR CLP MED LRG 11.4X10 (STAPLE)
APR CLP MED LRG 5 ANG JAW (MISCELLANEOUS)
BAG SPEC RTRVL LRG 6X4 10 (ENDOMECHANICALS) ×1
BENZOIN TINCTURE PRP APPL 2/3 (GAUZE/BANDAGES/DRESSINGS) ×2 IMPLANT
CABLE HIGH FREQUENCY MONO STRZ (ELECTRODE) IMPLANT
CANISTER SUCTION 2500CC (MISCELLANEOUS) ×2 IMPLANT
CATH REDDICK CHOLANGI 4FR 50CM (CATHETERS) IMPLANT
CLIP APPLIE 5 13 M/L LIGAMAX5 (MISCELLANEOUS) IMPLANT
CLIP APPLIE ROT 10 11.4 M/L (STAPLE) IMPLANT
CLOTH BEACON ORANGE TIMEOUT ST (SAFETY) ×2 IMPLANT
COVER MAYO STAND STRL (DRAPES) ×2 IMPLANT
COVER SURGICAL LIGHT HANDLE (MISCELLANEOUS) ×2 IMPLANT
DECANTER SPIKE VIAL GLASS SM (MISCELLANEOUS) ×2 IMPLANT
DRAPE C-ARM 42X72 X-RAY (DRAPES) ×2 IMPLANT
DRAPE LAPAROSCOPIC ABDOMINAL (DRAPES) ×2 IMPLANT
ELECT REM PT RETURN 9FT ADLT (ELECTROSURGICAL) ×2
ELECTRODE REM PT RTRN 9FT ADLT (ELECTROSURGICAL) ×1 IMPLANT
GLOVE BIOGEL M 8.0 STRL (GLOVE) ×2 IMPLANT
GOWN STRL NON-REIN LRG LVL3 (GOWN DISPOSABLE) ×2 IMPLANT
GOWN STRL REIN XL XLG (GOWN DISPOSABLE) ×4 IMPLANT
HEMOSTAT SURGICEL 4X8 (HEMOSTASIS) IMPLANT
IV CATH 14GX2 1/4 (CATHETERS) ×2 IMPLANT
KIT BASIN OR (CUSTOM PROCEDURE TRAY) ×2 IMPLANT
NS IRRIG 1000ML POUR BTL (IV SOLUTION) ×2 IMPLANT
POUCH SPECIMEN RETRIEVAL 10MM (ENDOMECHANICALS) ×2 IMPLANT
SCISSORS LAP 5X35 DISP (ENDOMECHANICALS) ×2 IMPLANT
SET IRRIG TUBING LAPAROSCOPIC (IRRIGATION / IRRIGATOR) ×2 IMPLANT
SLEEVE Z-THREAD 5X100MM (TROCAR) IMPLANT
SOLUTION ANTI FOG 6CC (MISCELLANEOUS) ×2 IMPLANT
STRIP CLOSURE SKIN 1/2X4 (GAUZE/BANDAGES/DRESSINGS) ×2 IMPLANT
SUT VIC AB 4-0 SH 18 (SUTURE) ×2 IMPLANT
SYR 30ML LL (SYRINGE) ×2 IMPLANT
TOWEL OR 17X26 10 PK STRL BLUE (TOWEL DISPOSABLE) ×4 IMPLANT
TRAY LAP CHOLE (CUSTOM PROCEDURE TRAY) ×2 IMPLANT
TROCAR BLADELESS OPT 5 75 (ENDOMECHANICALS) ×3 IMPLANT
TROCAR XCEL BLUNT TIP 100MML (ENDOMECHANICALS) ×1 IMPLANT
TROCAR XCEL NON-BLD 11X100MML (ENDOMECHANICALS) IMPLANT
TROCAR Z-THREAD FIOS 11X100 BL (TROCAR) ×2 IMPLANT
TROCAR Z-THREAD FIOS 5X100MM (TROCAR) ×2 IMPLANT
TUBING INSUFFLATION 10FT LAP (TUBING) ×2 IMPLANT

## 2012-05-10 NOTE — Op Note (Signed)
Kelli Martinez @date @  Procedure: Laparoscopic Cholecystectomy with intraoperative cholangiogram  Surgeon: Wenda Low, MD, FACS Asst:  Darnell Level, MD, FACS  Anes:  General  Drains: None  Findings: Chronic cholecystitis, normal IOC  Description of Procedure: The patient was taken to OR 1 and given general anesthesia.  The patient was prepped with PCMX and draped sterilely. A time out was performed.  Access to the abdomen was achieved with 5 mm Optiview.  Port placement included three 5 mm trocars and an 11 mm in the upper midline.    The gallbladder was visualized and the fundus was grasped and the gallbladder was elevated. Traction on the infundibulum allowed for successful demonstration of the critical view. Inflammatory changes were chronic.  The cystic duct was identified and clipped up on the gallbladder and an incision was made in the cystic duct and the Reddick catheter was inserted after milking the cystic duct of any debris. A dynamic cholangiogram was performed which demonstrated a long cystic duct but no common bile duct stones.    The cystic duct was then triple clipped and divided, the cystic artery was double clipped and divided and then the gallbladder was removed from the gallbladder bed. Removal of the gallbladder from the gallbladder bed was challenging because it was large, floppy and had some adherence of adhesions.  The gallbladder was then placed in a bag and brought out through one of the 10 mm trocar sites. The gallbladder bed was inspected and no bleeding or bile leaks were seen.   Laparoscopic visualization was used when closing fascial defects for trocar sites.   Incisions were injected with marcaine and closed with 4-0 Vicryl and Dermabond on the skin.  Sponge and needle count were correct.    The patient was taken to the recovery room in satisfactory condition.

## 2012-05-10 NOTE — Transfer of Care (Signed)
Immediate Anesthesia Transfer of Care Note  Patient: Kelli Martinez  Procedure(s) Performed: Procedure(s) (LRB) with comments: LAPAROSCOPIC CHOLECYSTECTOMY WITH INTRAOPERATIVE CHOLANGIOGRAM (N/A) - Laparoscopic Cholecystectomy with Intraoperative Cholangiogram  Patient Location: PACU  Anesthesia Type: General  Level of Consciousness: sedated  Airway & Oxygen Therapy: Patient Spontanous Breathing and Patient connected to face mask oxygen  Post-op Assessment: Report given to PACU RN  Post vital signs: Reviewed and stable  Complications: No apparent anesthesia complications

## 2012-05-10 NOTE — Anesthesia Preprocedure Evaluation (Addendum)
Anesthesia Evaluation  Patient identified by MRN, date of birth, ID band Patient awake    Reviewed: Allergy & Precautions, H&P , NPO status , Patient's Chart, lab work & pertinent test results  History of Anesthesia Complications (+) PONV  Airway Mallampati: II TM Distance: >3 FB Neck ROM: Full    Dental No notable dental hx.    Pulmonary neg pulmonary ROS, asthma , sleep apnea and Continuous Positive Airway Pressure Ventilation , PE (s/p IVC filter placement) breath sounds clear to auscultation  Pulmonary exam normal       Cardiovascular hypertension, Pt. on medications negative cardio ROS  Rhythm:Regular Rate:Normal     Neuro/Psych negative neurological ROS  negative psych ROS   GI/Hepatic negative GI ROS, Neg liver ROS,   Endo/Other  negative endocrine ROSdiabetes, Oral Hypoglycemic AgentsHypothyroidism   Renal/GU negative Renal ROS  negative genitourinary   Musculoskeletal negative musculoskeletal ROS (+)   Abdominal   Peds negative pediatric ROS (+)  Hematology negative hematology ROS (+)   Anesthesia Other Findings   Reproductive/Obstetrics negative OB ROS                          Anesthesia Physical Anesthesia Plan  ASA: III  Anesthesia Plan: General   Post-op Pain Management:    Induction: Intravenous  Airway Management Planned: Oral ETT  Additional Equipment:   Intra-op Plan:   Post-operative Plan: Extubation in OR  Informed Consent: I have reviewed the patients History and Physical, chart, labs and discussed the procedure including the risks, benefits and alternatives for the proposed anesthesia with the patient or authorized representative who has indicated his/her understanding and acceptance.   Dental advisory given  Plan Discussed with: CRNA  Anesthesia Plan Comments:         Anesthesia Quick Evaluation

## 2012-05-10 NOTE — Progress Notes (Signed)
RT Note; Unable to place pt on CPAP due to End Tidal CO2 monitor in pt's nose.RN made aware.

## 2012-05-10 NOTE — H&P (Signed)
Chief Complaint: Symptomatic gallstones  History of Present Illness: Kelli Martinez is an 66 y.o. female who is an advanced breast cancer survivor and has gallstones. I initially saw her regarding these a few years back and we were groin to manage him expectantly. Since that time she has had a Roux-en-Y gastric bypass and now is having problems with right-sided abdominal pain postprandially the Dr. Madilyn Fireman thinks is related to her gallbladder. She has gallstones by ultrasound.  Informed consent was obtained regarding removing her gallbladder with laparoscopic or open if necessary. Gallbladder book was given. We will need to stop her Coumadin about 5-7 days preop and bridge her with Lovenox. She has a [place] filter in place and history of DVT and pulmonary embolism.  Past Medical History   Diagnosis  Date   .  Allergy    .  Asthma    .  OSA (obstructive sleep apnea)    .  Pulmonary embolism    .  HX: breast cancer    .  Blind right eye      hemorrhage   .  Hypothyroid    .  Clotting disorder    .  Hypertension    .  Diabetes mellitus    .  Blood transfusion    .  Cancer    .  Morbid obesity    .  Syncope and collapse    .  Arthritis    .  GERD (gastroesophageal reflux disease)    .  Hyperlipidemia    .  Osteoporosis     Past Surgical History   Procedure  Date   .  Abdominal hysterectomy    .  Mastectomy      Right   .  Gastric bypass  2011     bariatric surgery   .  Ivc filter      recurrent DVT   .  Total knee arthroplasty    .  Bone marrow transplant    .  Cardiovascular stress test  05/23/2005     EF 53%   .  US echocardiography  10/27/08     EF 55-60%    Current Outpatient Prescriptions   Medication  Sig  Dispense  Refill   .  albuterol (PROAIR HFA) 108 (90 BASE) MCG/ACT inhaler  Inhale 2 puffs into the lungs 4 (four) times daily as needed for wheezing or shortness of breath. As needed rescue inhaler  3 Inhaler  3   .  atorvastatin (LIPITOR) 20 MG tablet  Take 20  mg by mouth daily.     .  ergocalciferol (VITAMIN D2) 50000 UNITS capsule  Take 50,000 Units by mouth once a week.     .  ferrous sulfate 325 (65 FE) MG tablet  Take 325 mg by mouth daily.     .  fluticasone (FLONASE) 50 MCG/ACT nasal spray  Place 2 sprays into the nose daily.  48 g  3   .  Fluticasone-Salmeterol (ADVAIR DISKUS) 100-50 MCG/DOSE AEPB  Inhale 1 puff into the lungs every 12 (twelve) hours.  180 each  3   .  furosemide (LASIX) 40 MG tablet  Take 40 mg by mouth daily.     Marland Kitchen  gabapentin (NEURONTIN) 300 MG capsule  Take 300 mg by mouth 3 (three) times daily.     Marland Kitchen  HYDROcodone-acetaminophen (VICODIN) 5-500 MG per tablet  Take 1 tablet by mouth every 6 (six) hours as needed.     Marland Kitchen  levothyroxine (  SYNTHROID, LEVOTHROID) 125 MCG tablet  Take 125 mcg by mouth daily.     .  magnesium chloride (SLOW-MAG) 64 MG TBEC  Take 1 tablet by mouth 2 (two) times daily.     .  NON FORMULARY  CPAP 6 [employer]     .  omeprazole (PRILOSEC) 20 MG capsule  Take 20 mg by mouth daily.     .  potassium chloride SA (K-DUR,KLOR-CON) 20 MEQ tablet  Take 20 mEq by mouth daily.     .  predniSONE (DELTASONE) 10 MG tablet  take 4 x 2 days, 3 x 2 days, 2 x 2 days, 1 x 2 days, then stop  20 tablet  0   .  verapamil (CALAN) 120 MG tablet  Take 120 mg by mouth 3 (three) times daily.     .  vitamin B-12 (CYANOCOBALAMIN) 500 MCG tablet  Take 500 mcg by mouth daily.     Marland Kitchen  warfarin (COUMADIN) 6 MG tablet  Take 6 mg by mouth daily. Take 10 mg on Sun, Wed and Fri ; 12 mg other 4 days     Review of patient's allergies indicates no known allergies.  Family History   Problem  Relation  Age of Onset   .  Sudden death  Mother       car accident    .  Cancer  Father  56      lung    .  Cancer  Sister  14      lung cancer    .  Asthma  Daughter    Social History: reports that she quit smoking about 33 years ago. Her smoking use included Cigarettes. She has a 5 pack-year smoking history. She does not have any smokeless  tobacco history on file. She reports that she does not drink alcohol or use illicit drugs.  REVIEW OF SYSTEMS - PERTINENT POSITIVES ONLY:  negative  Physical Exam:  Blood pressure 132/84, pulse 64, temperature 97.6 F (36.4 C), temperature source Temporal, resp. rate 16, height 5\' 9"  (1.753 m), weight 222 lb 6.4 oz (100.88 kg).  Body mass index is 32.84 kg/(m^2).  Gen: WDWN AAF NAD  Neurological: Alert and oriented to person, place, and time. Motor and sensory function is grossly intact  Head: Normocephalic and atraumatic.  Eyes: Conjunctivae are normal. Pupils are equal, round, and reactive to light. No scleral icterus.  Neck: Normal range of motion. Neck supple. No tracheal deviation or thyromegaly present.  Cardiovascular: SR without murmurs or gallops. No carotid bruits  Respiratory: Effort normal. No respiratory distress. No chest wall tenderness. Breath sounds normal. No wheezes, rales or rhonchi.  Abdomen: nontender at this point  GU:  Musculoskeletal: Normal range of motion. Extremities are nontender. No cyanosis, edema or clubbing noted Lymphadenopathy: No cervical, preauricular, postauricular or axillary adenopathy is present Skin: Skin is warm and dry. No rash noted. No diaphoresis. No erythema. No pallor. Pscyh: Normal mood and affect. Behavior is normal. Judgment and thought content normal.  LABORATORY RESULTS:  No results found for this or any previous visit (from the past 48 hour(s)).  RADIOLOGY RESULTS:  No results found.  Problem List:  Patient Active Problem List   Diagnosis   .  PULMONARY EMBOLISM   .  ALLERGIC RHINITIS   .  ASTHMA   .  SLEEP APNEA   .  BREAST CANCER, HX OF   .  Left leg paresthesias   .  Hypothyroid   .  Low  back pain   .  Hypertension   .  Morbid obesity   .  Syncope and collapse   .  Unspecified deficiency anemia   .  Roux Y Gastric Bypass April 2011; repair [facility]   Assessment & Plan:  Gallstones-symptomatic-schedule cholecystectomy    Hypoglycemia after eating (probably related to large volumes of sweet fruit that she is eating)  Matt B. [name], MD, [facility]  [phone] beeper  [phone]

## 2012-05-10 NOTE — Progress Notes (Signed)
Kelli Dun RN spoke with Dr. [name] regarding the Heparin order.  Pt last took her Lovenox 30mg  at 2130 05/09/12 and Dr. [name] informed and he ok'd the administration of Heparin order.

## 2012-05-10 NOTE — Progress Notes (Signed)
Spoke with MD [name] concerning that patient did not have an order for nausea medication, MD ordered Zofran 4mg  iv every 6 hours as needed Means, [employer] N RN 05-10-2012

## 2012-05-10 NOTE — Preoperative (Signed)
Beta Blockers   Reason not to administer Beta Blockers:Not Applicable 

## 2012-05-10 NOTE — Anesthesia Postprocedure Evaluation (Signed)
  Anesthesia Post-op Note  Patient: Kelli Martinez  Procedure(s) Performed: Procedure(s) (LRB): LAPAROSCOPIC CHOLECYSTECTOMY WITH INTRAOPERATIVE CHOLANGIOGRAM (N/A)  Patient Location: PACU  Anesthesia Type: General  Level of Consciousness: awake and alert   Airway and Oxygen Therapy: Patient Spontanous Breathing  Post-op Pain: mild  Post-op Assessment: Post-op Vital signs reviewed, Patient's Cardiovascular Status Stable, Respiratory Function Stable, Patent Airway and No signs of Nausea or vomiting  Post-op Vital Signs: stable  Complications: No apparent anesthesia complications

## 2012-05-10 NOTE — Anesthesia Procedure Notes (Signed)
Procedure Name: Intubation Date/Time: 05/10/2012 11:25 AM Performed by: Kelli Martinez L Patient Re-evaluated:Patient Re-evaluated prior to inductionOxygen Delivery Method: Circle system utilized Preoxygenation: Pre-oxygenation with 100% oxygen Intubation Type: IV induction Ventilation: Mask ventilation without difficulty and Oral airway inserted - appropriate to patient size Laryngoscope Size: Hyacinth Meeker and 2 Grade View: Grade I Tube type: Oral Tube size: 7.5 mm Number of attempts: 1 Airway Equipment and Method: Stylet Placement Confirmation: ETT inserted through vocal cords under direct vision,  breath sounds checked- equal and bilateral and positive ETCO2 Secured at: 21 cm Tube secured with: Tape Dental Injury: Teeth and Oropharynx as per pre-operative assessment

## 2012-05-11 LAB — COMPREHENSIVE METABOLIC PANEL
ALT: 118 U/L — ABNORMAL HIGH (ref 0–35)
Albumin: 2.8 g/dL — ABNORMAL LOW (ref 3.5–5.2)
Alkaline Phosphatase: 64 U/L (ref 39–117)
Calcium: 9 mg/dL (ref 8.4–10.5)
Potassium: 4.4 mEq/L (ref 3.5–5.1)
Sodium: 137 mEq/L (ref 135–145)
Total Protein: 5.7 g/dL — ABNORMAL LOW (ref 6.0–8.3)

## 2012-05-11 LAB — CBC
Hemoglobin: 11.8 g/dL — ABNORMAL LOW (ref 12.0–15.0)
MCHC: 33.1 g/dL (ref 30.0–36.0)
Platelets: 140 10*3/uL — ABNORMAL LOW (ref 150–400)
RDW: 15.1 % (ref 11.5–15.5)

## 2012-05-11 MED ORDER — WARFARIN SODIUM 7.5 MG PO TABS
15.0000 mg | ORAL_TABLET | Freq: Once | ORAL | Status: AC
  Administered 2012-05-11: 15 mg via ORAL
  Filled 2012-05-11: qty 2

## 2012-05-11 MED ORDER — WARFARIN - PHARMACIST DOSING INPATIENT: Freq: Every day | Status: DC

## 2012-05-11 NOTE — Progress Notes (Addendum)
ANTICOAGULATION CONSULT NOTE - Initial Consult  Pharmacy Consult for warfarin Indication: h/o VTE  Allergies  Allergen Reactions  . Adhesive (Tape) Rash    Patient Measurements: Height: 5\' 9"  (175.3 cm) Weight: 229 lb 3.8 oz (103.98 kg) IBW/kg (Calculated) : 66.2  Heparin Dosing Weight:   Vital Signs: Temp: 98.6 F (37 C) (10/12 0610) Temp src: Oral (10/12 0610) BP: 133/84 mmHg (10/12 0610) Pulse Rate: 50  (10/12 0610)  Labs:  Basename 05/11/12 0444 05/10/12 1504 05/10/12 0820  HGB 11.8* 12.5 --  HCT 35.7* 37.7 --  PLT 140* 149* --  APTT -- -- 29  LABPROT -- -- 12.8  INR -- -- 0.97  HEPARINUNFRC -- -- --  CREATININE 0.59 0.67 --  CKTOTAL -- -- --  CKMB -- -- --  TROPONINI -- -- --    Estimated Creatinine Clearance: 91.2 ml/min (by C-G formula based on Cr of 0.59).   Medical History: Past Medical History  Diagnosis Date  . Allergy   . Asthma   . Pulmonary embolism 1998 / 1994 /1968  . HX: breast cancer   . Blind right eye     hemorrhage  . Hypothyroid   . Clotting disorder   . Hypertension   . Blood transfusion   . Morbid obesity   . Syncope and collapse 2011    due to anemia  . GERD (gastroesophageal reflux disease)   . Hyperlipidemia   . Osteoporosis   . Psoriasis   . Gallstones   . Anemia   . Elevated liver enzymes   . Diabetes mellitus     hypoglycemia since large wt loss  . OSA (obstructive sleep apnea)     uses c pap  . Arthritis   . Cancer     breast 1994  . Lymphedema of arm     RT  . Back pain, chronic     GETS INJECTIONS IN BACK    Medications:  Scheduled:    . fentaNYL      . heparin  5,000 Units Subcutaneous Q8H  . morphine      . ondansetron      . DISCONTD: bupivacaine liposome  20 mL Infiltration Once  . DISCONTD: fluticasone  2 spray Each Nare Daily  . DISCONTD: Fluticasone-Salmeterol  1 puff Inhalation Q12H  . DISCONTD: furosemide  40 mg Oral q morning - 10a  . DISCONTD: gabapentin  300 mg Oral QID  . DISCONTD:  heparin  5,000 Units Subcutaneous Once  . DISCONTD: levothyroxine  125 mcg Oral QAC breakfast  . DISCONTD: pantoprazole  40 mg Oral Q1200  . DISCONTD: verapamil  120 mg Oral q morning - 10a    Assessment: 66 YOF s/p lap cholecystectomy on 10/11. She has h/o DVT/PE on coumadin which was held for her surgery.  She also as IVC filter.  She has been on SQ UFH post-op.   Home warfarin: 12.5mg  on Sun/Tu/Th/Fri/Sat and 10mg  on Mon/Wed  Goal of Therapy:  INR 2-3 Monitor platelets by anticoagulation protocol: Yes   Plan:  - Coumadin 15mg  PO x1 tonight - Daily INR - D/C SQ heparin when INR approaches therapeutic level   Dannielle Huh 05/11/2012,11:59 AM

## 2012-05-11 NOTE — Progress Notes (Signed)
Patient ID: Kelli Martinez, female   DOB: 1945/08/30, 66 y.o.   MRN: [id number] [facility] Progress Note:   1 Day Post-Op  Subjective: Mental status is clear.   Objective: Vital signs in last 24 hours: Temp:  [97.5 F (36.4 C)-99.8 F (37.7 C)] 98.6 F (37 C) (10/12 0610) Pulse Rate:  [50-81] 50  (10/12 0610) Resp:  [12-20] 12  (10/12 0610) BP: (114-153)/(61-91) 133/84 mmHg (10/12 0610) SpO2:  [99 %-100 %] 100 % (10/12 0610) Weight:  [229 lb 3.8 oz (103.98 kg)] 229 lb 3.8 oz (103.98 kg) (10/11 1415)  Intake/Output from previous day: 10/11 0701 - 10/12 0700 In: 1656.3 [I.V.:1656.3] Out: 3575 [Urine:3525; Blood:50] Intake/Output this shift: Total I/O In: 240 [P.O.:240] Out: -   Physical Exam: Work of breathing is normal.  Minimal pain from lap chole.  Will advance diet.   Lab Results:  Results for orders placed during the hospital encounter of 05/10/12 (from the past 48 hour(s))  APTT     Status: Normal   Collection Time   05/10/12  8:20 AM      Component Value Range Comment   aPTT 29  24 - 37 seconds   PROTIME-INR     Status: Normal   Collection Time   05/10/12  8:20 AM      Component Value Range Comment   Prothrombin Time 12.8  11.6 - 15.2 seconds    INR 0.97  0.00 - 1.49   GLUCOSE, CAPILLARY     Status: Normal   Collection Time   05/10/12  8:27 AM      Component Value Range Comment   Glucose-Capillary 81  70 - 99 mg/dL    Comment 1 Documented in Chart     GLUCOSE, CAPILLARY     Status: Abnormal   Collection Time   05/10/12 12:50 PM      Component Value Range Comment   Glucose-Capillary 172 (*) 70 - 99 mg/dL   CBC     Status: Abnormal   Collection Time   05/10/12  3:04 PM      Component Value Range Comment   WBC 7.9  4.0 - 10.5 K/uL    RBC 4.22  3.87 - 5.11 MIL/uL    Hemoglobin 12.5  12.0 - 15.0 g/dL    HCT 40.9  81.1 - 91.4 %    MCV 89.3  78.0 - 100.0 fL    MCH 29.6  26.0 - 34.0 pg    MCHC 33.2  30.0 - 36.0 g/dL    RDW 78.2  95.6 - 21.3  %    Platelets 149 (*) 150 - 400 K/uL   CREATININE, SERUM     Status: Normal   Collection Time   05/10/12  3:04 PM      Component Value Range Comment   Creatinine, Ser 0.67  0.50 - 1.10 mg/dL    GFR calc non Af Amer >90  >90 mL/min    GFR calc Af Amer >90  >90 mL/min   CBC     Status: Abnormal   Collection Time   05/11/12  4:44 AM      Component Value Range Comment   WBC 5.7  4.0 - 10.5 K/uL    RBC 4.01  3.87 - 5.11 MIL/uL    Hemoglobin 11.8 (*) 12.0 - 15.0 g/dL    HCT 08.6 (*) 57.8 - 46.0 %    MCV 89.0  78.0 - 100.0 fL    MCH 29.4  26.0 -  34.0 pg    MCHC 33.1  30.0 - 36.0 g/dL    RDW 14.7  82.9 - 56.2 %    Platelets 140 (*) 150 - 400 K/uL   COMPREHENSIVE METABOLIC PANEL     Status: Abnormal   Collection Time   05/11/12  4:44 AM      Component Value Range Comment   Sodium 137  135 - 145 mEq/L    Potassium 4.4  3.5 - 5.1 mEq/L    Chloride 103  96 - 112 mEq/L    CO2 28  19 - 32 mEq/L    Glucose, Bld 167 (*) 70 - 99 mg/dL    BUN 9  6 - 23 mg/dL    Creatinine, Ser 1.30  0.50 - 1.10 mg/dL    Calcium 9.0  8.4 - 86.5 mg/dL    Total Protein 5.7 (*) 6.0 - 8.3 g/dL    Albumin 2.8 (*) 3.5 - 5.2 g/dL    AST 68 (*) 0 - 37 U/L    ALT 118 (*) 0 - 35 U/L    Alkaline Phosphatase 64  39 - 117 U/L    Total Bilirubin 0.3  0.3 - 1.2 mg/dL    GFR calc non Af Amer >90  >90 mL/min    GFR calc Af Amer >90  >90 mL/min     Radiology/Results: Dg Cholangiogram Operative  05/10/2012  *RADIOLOGY REPORT*  Clinical Data:   Cholelithiasis  INTRAOPERATIVE CHOLANGIOGRAM  Technique:  Cholangiographic images from the C-arm fluoroscopic device were submitted for interpretation post-operatively.  Please see the procedural report for the amount of contrast and the fluoroscopy time utilized.  Comparison:  None  Findings:  No persistent filling defects in the common duct. Intrahepatic ducts are incompletely visualized, appearing decompressed centrally. Contrast passes into the duodenum.  IMPRESSION  Negative for  retained common duct stone.   Original Report Authenticated By: Osa Craver, M.D.     Anti-infectives: Anti-infectives     Start     Dose/Rate Route Frequency Ordered Stop   05/09/12 1626   cefOXitin (MEFOXIN) 2 g in dextrose 5 % 50 mL IVPB        2 g 100 mL/hr over 30 Minutes Intravenous 60 min pre-op 05/09/12 1626 05/10/12 1130          Assessment/Plan: Problem List: Patient Active Problem List  Diagnosis  . PULMONARY EMBOLISM  . ALLERGIC RHINITIS  . Allergic-infective asthma  . Obstructive sleep apnea  . BREAST CANCER, HX OF  . Left leg paresthesias  . Hypothyroid  . Low back pain  . Hypertension  . Morbid obesity  . Syncope and collapse  . Unspecified deficiency anemia  . Roux Y Gastric Bypass April 2011; repair [facility]    Advance diet. Start warfarin and then plan discharge tomorrow using Lovenox bridge.  1 Day Post-Op    LOS: 1 day   Matt B. [name], MD, [facility] [phone] beeper [phone]  05/11/2012 11:55 AM

## 2012-05-11 NOTE — Progress Notes (Signed)
   CARE MANAGEMENT NOTE 05/11/2012  Patient:  Kelli Martinez, Kelli Martinez   Account Number:  [id number]  Date Initiated:  05/11/2012  Documentation initiated by:  Aniela Caniglia  Subjective/Objective Assessment:   Order for c-pap     Action/Plan:   Order entered in error, pt has C-PAP at home.   Anticipated DC Date:  05/13/2012   Anticipated DC Plan:  HOME/SELF CARE         Choice offered to / List presented to:             Status of service:  Completed, signed off Medicare Important Message given?   (If response is "NO", the following Medicare IM given date fields will be blank) Date Medicare IM given:   Date Additional Medicare IM given:    Discharge Disposition:  HOME/SELF CARE  Per UR Regulation:    If discussed at Long Length of Stay Meetings, dates discussed:    Comments:

## 2012-05-11 NOTE — Progress Notes (Signed)
Patient is refusing CPAP therapy at this time due to sore throat.  Patient was encouraged to wear, states that she will inform RN if she changes her mind.

## 2012-05-11 NOTE — Progress Notes (Signed)
Utilization review completed.  

## 2012-05-12 MED ORDER — WARFARIN SODIUM 7.5 MG PO TABS
15.0000 mg | ORAL_TABLET | Freq: Once | ORAL | Status: DC
  Filled 2012-05-12: qty 2

## 2012-05-12 MED ORDER — ENOXAPARIN SODIUM 30 MG/0.3ML ~~LOC~~ SOLN: 60.0000 mg | Freq: Two times a day (BID) | SUBCUTANEOUS | Status: DC

## 2012-05-12 MED ORDER — OXYCODONE-ACETAMINOPHEN 5-325 MG PO TABS: 1.0000 | ORAL_TABLET | ORAL | Status: DC | PRN

## 2012-05-12 NOTE — Progress Notes (Signed)
ANTICOAGULATION CONSULT NOTE  Pharmacy Consult for warfarin Indication: h/o VTE  Allergies  Allergen Reactions  . Adhesive (Tape) Rash    Patient Measurements: Height: 5\' 9"  (175.3 cm) Weight: 229 lb 3.8 oz (103.98 kg) IBW/kg (Calculated) : 66.2    Vital Signs: Temp: 98.1 F (36.7 C) (10/13 0520) Temp src: Oral (10/13 0520) BP: 145/85 mmHg (10/13 0520) Pulse Rate: 53  (10/13 0520)  Labs:  Basename 05/12/12 0420 05/11/12 0444 05/10/12 1504 05/10/12 0820  HGB -- 11.8* 12.5 --  HCT -- 35.7* 37.7 --  PLT -- 140* 149* --  APTT -- -- -- 29  LABPROT 13.0 -- -- 12.8  INR 0.99 -- -- 0.97  HEPARINUNFRC -- -- -- --  CREATININE -- 0.59 0.67 --  CKTOTAL -- -- -- --  CKMB -- -- -- --  TROPONINI -- -- -- --    Estimated Creatinine Clearance: 91.2 ml/min (by C-G formula based on Cr of 0.59).   Medical History: Past Medical History  Diagnosis Date  . Allergy   . Asthma   . Pulmonary embolism 1998 / 1994 /1968  . HX: breast cancer   . Blind right eye     hemorrhage  . Hypothyroid   . Clotting disorder   . Hypertension   . Blood transfusion   . Morbid obesity   . Syncope and collapse 2011    due to anemia  . GERD (gastroesophageal reflux disease)   . Hyperlipidemia   . Osteoporosis   . Psoriasis   . Gallstones   . Anemia   . Elevated liver enzymes   . Diabetes mellitus     hypoglycemia since large wt loss  . OSA (obstructive sleep apnea)     uses c pap  . Arthritis   . Cancer     breast 1994  . Lymphedema of arm     RT  . Back pain, chronic     GETS INJECTIONS IN BACK    Medications:  Scheduled:     . heparin  5,000 Units Subcutaneous Q8H  . warfarin  15 mg Oral ONCE-1800  . Warfarin - Pharmacist Dosing Inpatient   Does not apply q1800    Assessment:  66 YOF on coumadin PTA for hx DVT /PE, has IVC filter. Home warfarin: 12.5mg  on Sun/Tu/Th/Fri/Sat and 10mg  on Mon/Wed  Pt is s/p lap cholecystectomy on 10/11. Coumadin was held for surgery and  resumed yesterday per pharmacy dosing.  Pt is also on Heparin SQ 5000 units q 8h.  INR subtherapeutic after 15mg  Coumadin last night  Goal of Therapy:  INR 2-3 Monitor platelets by anticoagulation protocol: Yes   Plan:  - Coumadin 15mg  PO x1 tonight - Daily INR - Continue Sq heparin until INR tx  Gwen Her PharmD  [phone] 05/12/2012 10:19 AM

## 2012-05-12 NOTE — Progress Notes (Signed)
Assessment unchanged. Pt verbalized understanding of dc instructions. Scripts x 2 given as provided by MD. Along  with other PTA meds and prescribed analgesics, pt will continue Lovenox injections at home as prior to admission. Stated "yes, I took those shots for a week before coming to hospital. My daughter will give them to me,". Daughter and pt verbalized understanding. Pt introduced to My Chart and how to access with verbalized understanding. Pt dc'd via wc to front entrance to meet awaiting vehicle to carry home. Accompanied by NT and daughter.

## 2012-05-12 NOTE — Discharge Summary (Signed)
Physician Discharge Summary  Patient ID: Kelli Martinez MRN: [id number] DOB/AGE: November 25, 1945 66 y.o.  Admit date: 05/10/2012 Discharge date: 05/12/2012  Admission Diagnoses:  Gallstones post weight loss  Discharge Diagnoses:  Same with chronic cholecystitis  Active Problems:  * No active hospital problems. *    Surgery:  Lap cholecystectomy with IOC  Discharged Condition: improved  Hospital Course:   Had surgery on Friday.  Maintained on heparin during hospital stay.  Restarted coumadin on Saturday and sent home on Lovenox bridge on Sunday.  Doing well.  Incisions bland  Consults: pharmacy  Significant Diagnostic Studies: none    Discharge Exam: Blood pressure 145/85, pulse 53, temperature 98.1 F (36.7 C), temperature source Oral, resp. rate 18, height 5\' 9"  (1.753 m), weight 229 lb 3.8 oz (103.98 kg), SpO2 99.00%. Incisions bland  Disposition:   Discharge Orders    Future Appointments: Provider: Department: Dept Phone: Center:   05/29/2012 2:10 PM Valarie Merino, MD [facility] [phone] None   07/08/2012 9:00 AM Sherrie George, MD [facility] [phone] None   10/29/2012 10:00 AM Windell Hummingbird [facility] [phone] None   10/29/2012 10:30 AM Benjiman Core, MD [facility] [phone] None     Future Orders Please Complete By Expires   Diet - low sodium heart healthy      Increase activity slowly      Discharge instructions      Comments:   Resume bariatric/diabetes diet Take Coumadin 10 mg Monday and Wednesday and 12.5 mg on Sunday, Tuesday, Thursday, Friday, Saturday Use Lovenox 60 mg injection twice a day until INR is back to 2-3 times normal.  Check with Dr. Lucianne Muss.       Medication List     As of 05/12/2012 10:26 AM    TAKE these medications         albuterol 108 (90 BASE) MCG/ACT inhaler   Commonly known as: PROVENTIL HFA;VENTOLIN HFA   Inhale 2 puffs into the lungs 4 (four) times daily as needed. As needed  rescue inhaler      atorvastatin 20 MG tablet   Commonly known as: LIPITOR   Take 20 mg by mouth every morning.      enoxaparin 30 MG/0.3ML injection   Commonly known as: LOVENOX   Inject 0.6 mLs (60 mg total) into the skin every 12 (twelve) hours.      enoxaparin 60 MG/0.6ML injection   Commonly known as: LOVENOX   Inject 60 mg into the skin 2 (two) times daily.      ergocalciferol 50000 UNITS capsule   Commonly known as: VITAMIN D2   Take 50,000 Units by mouth every Friday.      ferrous sulfate 325 (65 FE) MG tablet   Take 325 mg by mouth daily.      fluticasone 50 MCG/ACT nasal spray   Commonly known as: FLONASE   Place 2 sprays into the nose daily.      Fluticasone-Salmeterol 100-50 MCG/DOSE Aepb   Commonly known as: ADVAIR   Inhale 1 puff into the lungs every 12 (twelve) hours. Rinse mouth      furosemide 40 MG tablet   Commonly known as: LASIX   Take 40 mg by mouth every morning.      gabapentin 300 MG capsule   Commonly known as: NEURONTIN   Take 300 mg by mouth 4 (four) times daily.      HYDROcodone-acetaminophen 5-500 MG per tablet   Commonly known as: VICODIN  Take 1 tablet by mouth every 6 (six) hours as needed. Pain      levothyroxine 125 MCG tablet   Commonly known as: SYNTHROID, LEVOTHROID   Take 125 mcg by mouth daily before breakfast.      magnesium chloride 64 MG Tbec   Commonly known as: SLOW-MAG   Take 1 tablet by mouth 2 (two) times daily.      methylcellulose 1 % ophthalmic solution   Commonly known as: ARTIFICIAL TEARS   Place 1 drop into both eyes as needed. Dry eyes      multivitamin tablet   Take 1 tablet by mouth daily.      NON FORMULARY   CPAP 6 [employer]      omeprazole 20 MG capsule   Commonly known as: PRILOSEC   Take 20 mg by mouth daily.      oxyCODONE-acetaminophen 5-325 MG per tablet   Commonly known as: PERCOCET/ROXICET   Take 1-2 tablets by mouth every 4 (four) hours as needed.      potassium chloride SA 20 MEQ  tablet   Commonly known as: K-DUR,KLOR-CON   Take 20 mEq by mouth daily.      verapamil 120 MG tablet   Commonly known as: CALAN   Take 120 mg by mouth every morning.      vitamin B-12 500 MCG tablet   Commonly known as: CYANOCOBALAMIN   Take 500 mcg by mouth daily.      warfarin 10 MG tablet   Commonly known as: COUMADIN   Take 10 mg by mouth daily. Pt takes 1 tablet 10 mg on Monday and Wednesday.Pt take 12.5 mg on Sunday, Tuesday, Thursday, Friday, and Saturday. Pt takes 1 whole 10 mg and 0.5 tablet of 5 mg.      warfarin 5 MG tablet   Commonly known as: COUMADIN   Take 2.5 mg by mouth daily. Pt take 12.5 mg on Sunday, Tuesday, Thursday, Friday, and Saturday. Pt takes 1 whole 10 mg and 0.5 tablet of 5 mg.           Follow-up Information    Follow up with Madalen Gavin B, MD. In 3 weeks.   Contact information:   [address] [place] [address] [phone]          Signed: Valarie Merino 05/12/2012, 10:26 AM

## 2012-05-13 ENCOUNTER — Encounter (HOSPITAL_COMMUNITY): Payer: Self-pay | Admitting: Surgery

## 2012-05-13 ENCOUNTER — Telehealth: Payer: Self-pay | Admitting: Oncology

## 2012-05-13 NOTE — Care Management Note (Signed)
    Page 1 of 1   05/13/2012     1:21:45 PM   CARE MANAGEMENT NOTE 05/13/2012  Patient:  Kelli Martinez, Kelli Martinez   Account Number:  [id number]  Date Initiated:  05/11/2012  Documentation initiated by:  Johny Shock  Subjective/Objective Assessment:   Order for c-pap     Action/Plan:   Order entered in error, pt has C-PAP at home.   Anticipated DC Date:  05/13/2012   Anticipated DC Plan:  HOME/SELF CARE         Choice offered to / List presented to:             Status of service:  Completed, signed off Medicare Important Message given?   (If response is "NO", the following Medicare IM given date fields will be blank) Date Medicare IM given:   Date Additional Medicare IM given:    Discharge Disposition:  HOME/SELF CARE  Per UR Regulation:  Reviewed for med. necessity/level of care/duration of stay  If discussed at Long Length of Stay Meetings, dates discussed:    Comments:

## 2012-05-13 NOTE — Telephone Encounter (Signed)
Pt called asking for assistance for a lymph edema glove.  I will ask our financial counselors to contact her to see if she qualifies.

## 2012-05-14 ENCOUNTER — Encounter: Payer: Self-pay | Admitting: Oncology

## 2012-05-14 NOTE — Progress Notes (Signed)
Received an email from [facility] requesting I reach out to pt to see if she will qualify for the AEF to assist with a lymph edema sleeve that pt will need.  I talked w/ pt and I will send her an EPP application in the mail today.  Once I receive the needed info I will process it to see if she qualifies for assistance.

## 2012-05-17 ENCOUNTER — Telehealth (INDEPENDENT_AMBULATORY_CARE_PROVIDER_SITE_OTHER): Payer: Self-pay | Admitting: General Surgery

## 2012-05-17 NOTE — Telephone Encounter (Signed)
LMOM letting pt know that we are having to reschedule her first PO appt w/ Dr. [name] from 10/30 to 11/1 at 9:10.  I also sent a reminder card in the mail.

## 2012-05-23 DIAGNOSIS — Z7901 Long term (current) use of anticoagulants: Secondary | ICD-10-CM | POA: Diagnosis not present

## 2012-05-29 ENCOUNTER — Encounter (INDEPENDENT_AMBULATORY_CARE_PROVIDER_SITE_OTHER): Payer: Medicare Other | Admitting: Surgery

## 2012-05-31 ENCOUNTER — Encounter (INDEPENDENT_AMBULATORY_CARE_PROVIDER_SITE_OTHER): Payer: Self-pay | Admitting: Surgery

## 2012-05-31 ENCOUNTER — Ambulatory Visit (INDEPENDENT_AMBULATORY_CARE_PROVIDER_SITE_OTHER): Payer: Medicare Other | Admitting: Surgery

## 2012-05-31 VITALS — BP 116/66 | HR 66 | Temp 97.2°F | Ht 70.0 in | Wt 219.2 lb

## 2012-05-31 DIAGNOSIS — Z9889 Other specified postprocedural states: Secondary | ICD-10-CM

## 2012-05-31 DIAGNOSIS — Z9049 Acquired absence of other specified parts of digestive tract: Secondary | ICD-10-CM

## 2012-05-31 NOTE — Patient Instructions (Signed)
Thanks for your patience.  If you need further assistance after leaving the office, please call our office and speak with a [facility] nurse.  [phone].  If you want to leave a message for Dr. [name], please call his office phone at [phone].  See me in April for 3 year anniversary of your bypass

## 2012-05-31 NOTE — Progress Notes (Signed)
Kelli Martinez 66 y.o.  Body mass index is 31.45 kg/(m^2).  Patient Active Problem List  Diagnosis  . PULMONARY EMBOLISM  . ALLERGIC RHINITIS  . Allergic-infective asthma  . Obstructive sleep apnea  . BREAST CANCER, HX OF  . Left leg paresthesias  . Hypothyroid  . Low back pain  . Hypertension  . Morbid obesity  . Syncope and collapse  . Unspecified deficiency anemia  . 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 Adhesive (Tape) Rash    Past Surgical History  Procedure Date  . Abdominal hysterectomy   . Mastectomy     Right  . Gastric bypass 2011    bariatric surgery  . Ivc filter     recurrent DVT  . Bone marrow transplant 1994  . Cardiovascular stress test 05/23/2005    EF 53%  . US echocardiography 10/27/08    EF 55-60%  . Knee arthrotomy 1998  . Carpal tunnell     bil  . Cataracts   . Hiatal hernia repair   . Cholecystectomy 05/10/2012    Procedure: LAPAROSCOPIC CHOLECYSTECTOMY WITH INTRAOPERATIVE CHOLANGIOGRAM;  Surgeon: Valarie Merino, MD;  Location: [facility];  Service: General;  Laterality: N/A;  Laparoscopic Cholecystectomy with Intraoperative Cholangiogram   Sanda Linger, MD No diagnosis found.  Doing well after Lap Chole.  Incisions have healed OK.  Bowels working OK.  Having some hypoglycemia issues and I directed her toward more complex carbs instead of some sweet fruits that she eats in the morning.  Return April 2014 for 3 year anniversary bypass.  Matt B. [name], MD, [facility] [phone] beeper [phone]  05/31/2012 9:27 AM

## 2012-06-18 DIAGNOSIS — E119 Type 2 diabetes mellitus without complications: Secondary | ICD-10-CM | POA: Diagnosis not present

## 2012-06-18 DIAGNOSIS — I890 Lymphedema, not elsewhere classified: Secondary | ICD-10-CM | POA: Diagnosis not present

## 2012-06-18 DIAGNOSIS — R748 Abnormal levels of other serum enzymes: Secondary | ICD-10-CM | POA: Diagnosis not present

## 2012-06-18 DIAGNOSIS — R609 Edema, unspecified: Secondary | ICD-10-CM | POA: Diagnosis not present

## 2012-06-18 DIAGNOSIS — Z7901 Long term (current) use of anticoagulants: Secondary | ICD-10-CM | POA: Diagnosis not present

## 2012-07-05 ENCOUNTER — Telehealth: Payer: Self-pay | Admitting: Internal Medicine

## 2012-07-05 MED ORDER — AZITHROMYCIN 250 MG PO TABS: 250.0000 mg | ORAL_TABLET | ORAL | Status: DC

## 2012-07-05 NOTE — Telephone Encounter (Signed)
Per CY, patient was offered a Zpak Rx..called into [facility].  Zpak #1 As directed on packet.  Patient aware that abx called in.

## 2012-07-05 NOTE — Telephone Encounter (Signed)
Called and spoke with pt and she stated that she has had cough with thick white sputum, PND and sore throat x 1 week.  Chills and feeling hot.  Pt is requesting recs from CY.  Please advise. Thanks  Last ov--04/23/2012 Next ov--no pending ov  Allergies  Allergen Reactions  . Adhesive (Tape) Rash

## 2012-07-05 NOTE — Telephone Encounter (Signed)
Unable to reach pt at home number. We only get a fast busy signal. Cell number does not accept incoming calls according to recording received. WCB

## 2012-07-08 ENCOUNTER — Ambulatory Visit (INDEPENDENT_AMBULATORY_CARE_PROVIDER_SITE_OTHER): Payer: Medicare Other | Admitting: Ophthalmology

## 2012-07-08 DIAGNOSIS — H35039 Hypertensive retinopathy, unspecified eye: Secondary | ICD-10-CM

## 2012-07-08 DIAGNOSIS — H348392 Tributary (branch) retinal vein occlusion, unspecified eye, stable: Secondary | ICD-10-CM

## 2012-07-08 DIAGNOSIS — H2510 Age-related nuclear cataract, unspecified eye: Secondary | ICD-10-CM

## 2012-07-08 DIAGNOSIS — I10 Essential (primary) hypertension: Secondary | ICD-10-CM

## 2012-07-08 DIAGNOSIS — H43819 Vitreous degeneration, unspecified eye: Secondary | ICD-10-CM

## 2012-07-11 ENCOUNTER — Ambulatory Visit: Payer: Medicare Other | Attending: Endocrinology | Admitting: Physical Therapy

## 2012-07-11 DIAGNOSIS — I890 Lymphedema, not elsewhere classified: Secondary | ICD-10-CM | POA: Diagnosis not present

## 2012-07-11 DIAGNOSIS — IMO0001 Reserved for inherently not codable concepts without codable children: Secondary | ICD-10-CM | POA: Diagnosis not present

## 2012-07-11 DIAGNOSIS — M24519 Contracture, unspecified shoulder: Secondary | ICD-10-CM | POA: Insufficient documentation

## 2012-07-11 DIAGNOSIS — M6281 Muscle weakness (generalized): Secondary | ICD-10-CM | POA: Insufficient documentation

## 2012-07-11 DIAGNOSIS — M1710 Unilateral primary osteoarthritis, unspecified knee: Secondary | ICD-10-CM | POA: Diagnosis not present

## 2012-07-15 DIAGNOSIS — R748 Abnormal levels of other serum enzymes: Secondary | ICD-10-CM | POA: Diagnosis not present

## 2012-07-15 DIAGNOSIS — Z7901 Long term (current) use of anticoagulants: Secondary | ICD-10-CM | POA: Diagnosis not present

## 2012-07-16 ENCOUNTER — Ambulatory Visit: Payer: Medicare Other

## 2012-07-16 DIAGNOSIS — R748 Abnormal levels of other serum enzymes: Secondary | ICD-10-CM | POA: Diagnosis not present

## 2012-07-16 DIAGNOSIS — I890 Lymphedema, not elsewhere classified: Secondary | ICD-10-CM | POA: Diagnosis not present

## 2012-07-16 DIAGNOSIS — Z7901 Long term (current) use of anticoagulants: Secondary | ICD-10-CM | POA: Diagnosis not present

## 2012-07-16 DIAGNOSIS — K5909 Other constipation: Secondary | ICD-10-CM | POA: Diagnosis not present

## 2012-07-16 DIAGNOSIS — M6281 Muscle weakness (generalized): Secondary | ICD-10-CM | POA: Diagnosis not present

## 2012-07-16 DIAGNOSIS — E119 Type 2 diabetes mellitus without complications: Secondary | ICD-10-CM | POA: Diagnosis not present

## 2012-07-16 DIAGNOSIS — IMO0001 Reserved for inherently not codable concepts without codable children: Secondary | ICD-10-CM | POA: Diagnosis not present

## 2012-07-16 DIAGNOSIS — R609 Edema, unspecified: Secondary | ICD-10-CM | POA: Diagnosis not present

## 2012-07-16 DIAGNOSIS — M24519 Contracture, unspecified shoulder: Secondary | ICD-10-CM | POA: Diagnosis not present

## 2012-07-17 ENCOUNTER — Ambulatory Visit: Payer: Medicare Other

## 2012-07-17 DIAGNOSIS — I890 Lymphedema, not elsewhere classified: Secondary | ICD-10-CM | POA: Diagnosis not present

## 2012-07-17 DIAGNOSIS — IMO0001 Reserved for inherently not codable concepts without codable children: Secondary | ICD-10-CM | POA: Diagnosis not present

## 2012-07-17 DIAGNOSIS — M6281 Muscle weakness (generalized): Secondary | ICD-10-CM | POA: Diagnosis not present

## 2012-07-17 DIAGNOSIS — M24519 Contracture, unspecified shoulder: Secondary | ICD-10-CM | POA: Diagnosis not present

## 2012-07-18 DIAGNOSIS — M1710 Unilateral primary osteoarthritis, unspecified knee: Secondary | ICD-10-CM | POA: Diagnosis not present

## 2012-07-23 ENCOUNTER — Encounter (INDEPENDENT_AMBULATORY_CARE_PROVIDER_SITE_OTHER): Payer: Self-pay

## 2012-07-29 DIAGNOSIS — M1710 Unilateral primary osteoarthritis, unspecified knee: Secondary | ICD-10-CM | POA: Diagnosis not present

## 2012-08-05 ENCOUNTER — Ambulatory Visit: Payer: Medicare Other | Attending: Endocrinology

## 2012-08-05 DIAGNOSIS — M24519 Contracture, unspecified shoulder: Secondary | ICD-10-CM | POA: Diagnosis not present

## 2012-08-05 DIAGNOSIS — I890 Lymphedema, not elsewhere classified: Secondary | ICD-10-CM | POA: Diagnosis not present

## 2012-08-05 DIAGNOSIS — M6281 Muscle weakness (generalized): Secondary | ICD-10-CM | POA: Diagnosis not present

## 2012-08-05 DIAGNOSIS — IMO0001 Reserved for inherently not codable concepts without codable children: Secondary | ICD-10-CM | POA: Insufficient documentation

## 2012-08-07 ENCOUNTER — Ambulatory Visit: Payer: Medicare Other

## 2012-08-12 ENCOUNTER — Ambulatory Visit: Payer: Medicare Other

## 2012-08-13 DIAGNOSIS — Z7901 Long term (current) use of anticoagulants: Secondary | ICD-10-CM | POA: Diagnosis not present

## 2012-08-14 ENCOUNTER — Ambulatory Visit: Payer: Medicare Other

## 2012-08-19 ENCOUNTER — Ambulatory Visit: Payer: Medicare Other

## 2012-08-21 ENCOUNTER — Ambulatory Visit: Payer: Medicare Other

## 2012-08-26 ENCOUNTER — Ambulatory Visit: Payer: Medicare Other

## 2012-08-28 ENCOUNTER — Ambulatory Visit: Payer: Medicare Other

## 2012-09-02 ENCOUNTER — Ambulatory Visit: Payer: Medicare Other | Attending: Endocrinology

## 2012-09-02 DIAGNOSIS — IMO0001 Reserved for inherently not codable concepts without codable children: Secondary | ICD-10-CM | POA: Insufficient documentation

## 2012-09-02 DIAGNOSIS — M24519 Contracture, unspecified shoulder: Secondary | ICD-10-CM | POA: Diagnosis not present

## 2012-09-02 DIAGNOSIS — M6281 Muscle weakness (generalized): Secondary | ICD-10-CM | POA: Diagnosis not present

## 2012-09-02 DIAGNOSIS — I890 Lymphedema, not elsewhere classified: Secondary | ICD-10-CM | POA: Insufficient documentation

## 2012-09-04 ENCOUNTER — Ambulatory Visit: Payer: Medicare Other

## 2012-09-09 ENCOUNTER — Ambulatory Visit: Payer: Medicare Other

## 2012-09-11 ENCOUNTER — Ambulatory Visit: Payer: Medicare Other

## 2012-09-16 ENCOUNTER — Ambulatory Visit: Payer: Medicare Other

## 2012-09-16 DIAGNOSIS — Z7901 Long term (current) use of anticoagulants: Secondary | ICD-10-CM | POA: Diagnosis not present

## 2012-09-16 DIAGNOSIS — E119 Type 2 diabetes mellitus without complications: Secondary | ICD-10-CM | POA: Diagnosis not present

## 2012-09-18 ENCOUNTER — Ambulatory Visit: Payer: Medicare Other

## 2012-09-18 DIAGNOSIS — I890 Lymphedema, not elsewhere classified: Secondary | ICD-10-CM | POA: Diagnosis not present

## 2012-09-18 DIAGNOSIS — E162 Hypoglycemia, unspecified: Secondary | ICD-10-CM | POA: Diagnosis not present

## 2012-09-18 DIAGNOSIS — M549 Dorsalgia, unspecified: Secondary | ICD-10-CM | POA: Diagnosis not present

## 2012-09-18 DIAGNOSIS — R609 Edema, unspecified: Secondary | ICD-10-CM | POA: Diagnosis not present

## 2012-09-18 DIAGNOSIS — Z7901 Long term (current) use of anticoagulants: Secondary | ICD-10-CM | POA: Diagnosis not present

## 2012-09-18 DIAGNOSIS — E119 Type 2 diabetes mellitus without complications: Secondary | ICD-10-CM | POA: Diagnosis not present

## 2012-09-18 DIAGNOSIS — E7800 Pure hypercholesterolemia, unspecified: Secondary | ICD-10-CM | POA: Diagnosis not present

## 2012-09-23 ENCOUNTER — Ambulatory Visit: Payer: Medicare Other | Admitting: *Deleted

## 2012-09-25 ENCOUNTER — Ambulatory Visit: Payer: Medicare Other | Admitting: Physical Therapy

## 2012-09-30 ENCOUNTER — Ambulatory Visit: Payer: Medicare Other | Attending: Endocrinology

## 2012-09-30 DIAGNOSIS — M6281 Muscle weakness (generalized): Secondary | ICD-10-CM | POA: Diagnosis not present

## 2012-09-30 DIAGNOSIS — M24519 Contracture, unspecified shoulder: Secondary | ICD-10-CM | POA: Diagnosis not present

## 2012-09-30 DIAGNOSIS — IMO0001 Reserved for inherently not codable concepts without codable children: Secondary | ICD-10-CM | POA: Diagnosis not present

## 2012-09-30 DIAGNOSIS — I890 Lymphedema, not elsewhere classified: Secondary | ICD-10-CM | POA: Insufficient documentation

## 2012-10-02 ENCOUNTER — Ambulatory Visit: Payer: Medicare Other

## 2012-10-02 DIAGNOSIS — IMO0001 Reserved for inherently not codable concepts without codable children: Secondary | ICD-10-CM | POA: Diagnosis not present

## 2012-10-02 DIAGNOSIS — I890 Lymphedema, not elsewhere classified: Secondary | ICD-10-CM | POA: Diagnosis not present

## 2012-10-02 DIAGNOSIS — M6281 Muscle weakness (generalized): Secondary | ICD-10-CM | POA: Diagnosis not present

## 2012-10-02 DIAGNOSIS — M24519 Contracture, unspecified shoulder: Secondary | ICD-10-CM | POA: Diagnosis not present

## 2012-10-04 ENCOUNTER — Ambulatory Visit: Payer: Medicare Other

## 2012-10-07 ENCOUNTER — Ambulatory Visit: Payer: Medicare Other

## 2012-10-07 DIAGNOSIS — M6281 Muscle weakness (generalized): Secondary | ICD-10-CM | POA: Diagnosis not present

## 2012-10-07 DIAGNOSIS — IMO0001 Reserved for inherently not codable concepts without codable children: Secondary | ICD-10-CM | POA: Diagnosis not present

## 2012-10-07 DIAGNOSIS — I890 Lymphedema, not elsewhere classified: Secondary | ICD-10-CM | POA: Diagnosis not present

## 2012-10-07 DIAGNOSIS — M24519 Contracture, unspecified shoulder: Secondary | ICD-10-CM | POA: Diagnosis not present

## 2012-10-09 ENCOUNTER — Ambulatory Visit: Payer: Medicare Other

## 2012-10-09 DIAGNOSIS — M6281 Muscle weakness (generalized): Secondary | ICD-10-CM | POA: Diagnosis not present

## 2012-10-09 DIAGNOSIS — I890 Lymphedema, not elsewhere classified: Secondary | ICD-10-CM | POA: Diagnosis not present

## 2012-10-09 DIAGNOSIS — IMO0001 Reserved for inherently not codable concepts without codable children: Secondary | ICD-10-CM | POA: Diagnosis not present

## 2012-10-09 DIAGNOSIS — M24519 Contracture, unspecified shoulder: Secondary | ICD-10-CM | POA: Diagnosis not present

## 2012-10-11 ENCOUNTER — Ambulatory Visit: Payer: Medicare Other

## 2012-10-11 DIAGNOSIS — IMO0001 Reserved for inherently not codable concepts without codable children: Secondary | ICD-10-CM | POA: Diagnosis not present

## 2012-10-11 DIAGNOSIS — M6281 Muscle weakness (generalized): Secondary | ICD-10-CM | POA: Diagnosis not present

## 2012-10-11 DIAGNOSIS — M24519 Contracture, unspecified shoulder: Secondary | ICD-10-CM | POA: Diagnosis not present

## 2012-10-11 DIAGNOSIS — I890 Lymphedema, not elsewhere classified: Secondary | ICD-10-CM | POA: Diagnosis not present

## 2012-10-15 ENCOUNTER — Ambulatory Visit: Payer: Medicare Other | Admitting: Physical Therapy

## 2012-10-15 DIAGNOSIS — M6281 Muscle weakness (generalized): Secondary | ICD-10-CM | POA: Diagnosis not present

## 2012-10-15 DIAGNOSIS — I890 Lymphedema, not elsewhere classified: Secondary | ICD-10-CM | POA: Diagnosis not present

## 2012-10-15 DIAGNOSIS — IMO0001 Reserved for inherently not codable concepts without codable children: Secondary | ICD-10-CM | POA: Diagnosis not present

## 2012-10-15 DIAGNOSIS — M24519 Contracture, unspecified shoulder: Secondary | ICD-10-CM | POA: Diagnosis not present

## 2012-10-17 ENCOUNTER — Ambulatory Visit: Payer: Medicare Other

## 2012-10-17 DIAGNOSIS — Z7901 Long term (current) use of anticoagulants: Secondary | ICD-10-CM | POA: Diagnosis not present

## 2012-10-29 ENCOUNTER — Telehealth: Payer: Self-pay | Admitting: Oncology

## 2012-10-29 ENCOUNTER — Ambulatory Visit (HOSPITAL_BASED_OUTPATIENT_CLINIC_OR_DEPARTMENT_OTHER): Payer: Medicare Other | Admitting: Oncology

## 2012-10-29 ENCOUNTER — Other Ambulatory Visit (HOSPITAL_BASED_OUTPATIENT_CLINIC_OR_DEPARTMENT_OTHER): Payer: Medicare Other | Admitting: Lab

## 2012-10-29 VITALS — BP 145/80 | HR 63 | Temp 98.6°F | Resp 18 | Ht 70.0 in | Wt 228.4 lb

## 2012-10-29 DIAGNOSIS — Z86718 Personal history of other venous thrombosis and embolism: Secondary | ICD-10-CM

## 2012-10-29 DIAGNOSIS — Z853 Personal history of malignant neoplasm of breast: Secondary | ICD-10-CM

## 2012-10-29 DIAGNOSIS — D649 Anemia, unspecified: Secondary | ICD-10-CM | POA: Diagnosis not present

## 2012-10-29 DIAGNOSIS — D509 Iron deficiency anemia, unspecified: Secondary | ICD-10-CM

## 2012-10-29 LAB — IRON AND TIBC
Iron: 86 ug/dL (ref 42–145)
TIBC: 336 ug/dL (ref 250–470)
UIBC: 250 ug/dL (ref 125–400)

## 2012-10-29 LAB — COMPREHENSIVE METABOLIC PANEL (CC13)
Albumin: 3.4 g/dL — ABNORMAL LOW (ref 3.5–5.0)
Alkaline Phosphatase: 106 U/L (ref 40–150)
BUN: 12.8 mg/dL (ref 7.0–26.0)
CO2: 29 mEq/L (ref 22–29)
Glucose: 95 mg/dl (ref 70–99)
Potassium: 4.6 mEq/L (ref 3.5–5.1)
Sodium: 139 mEq/L (ref 136–145)
Total Protein: 6.9 g/dL (ref 6.4–8.3)

## 2012-10-29 LAB — CBC WITH DIFFERENTIAL/PLATELET
Basophils Absolute: 0 10*3/uL (ref 0.0–0.1)
Eosinophils Absolute: 0.2 10*3/uL (ref 0.0–0.5)
HGB: 12.5 g/dL (ref 11.6–15.9)
LYMPH%: 38.8 % (ref 14.0–49.7)
MCV: 88.7 fL (ref 79.5–101.0)
MONO#: 0.3 10*3/uL (ref 0.1–0.9)
MONO%: 6.4 % (ref 0.0–14.0)
NEUT#: 2.2 10*3/uL (ref 1.5–6.5)
Platelets: 164 10*3/uL (ref 145–400)
RDW: 13.9 % (ref 11.2–14.5)
WBC: 4.3 10*3/uL (ref 3.9–10.3)

## 2012-10-29 NOTE — Progress Notes (Signed)
Hematology and Oncology Follow Up Visit  JENAVIE STANCZAK [id number] 10-03-1945 67 y.o. 10/29/2012 10:57 AM  CC: [name], M.D.  [place] [name], MD    Principle Diagnosis:  This is a 67 year old female with the following issues: 1. Multifactorial anemia.  She has an element of iron deficiency, as well as anemia of renal disease, currently on p.o. iron replacement, and has not really required any IV iron recently. 2. History of breast cancer diagnosed in 1993.  She is status post mastectomy followed by adjuvant radiation therapy.  She had also received high-dose chemotherapy and stem-cell transplantation in 1994.  She continues to be in remission from that standpoint.   3. History of gastric bypass surgery done in April 2011.   Interim History: Ms Hubka presents today for a followup visit. She has reported no new symptoms since the last time I saw her.  She is reporting slight fatigue but overall her energy is reasonable and she does experience any hypoglycemic episodes.  She does not report any chest pain; does not report any difficulty breathing.  For the most part her performance status and activity level remain reasonable.  Her weight, as mentioned, continues to drop after her gastric bypass procedure. She has lost over 100 lbs. She is having episodes of low blood sugars at time.    Medications: I have reviewed the patient's current medications. Current outpatient prescriptions:albuterol (PROVENTIL HFA;VENTOLIN HFA) 108 (90 BASE) MCG/ACT inhaler, Inhale 2 puffs into the lungs 4 (four) times daily as needed. As needed rescue inhaler, Disp: , Rfl: ;  atorvastatin (LIPITOR) 20 MG tablet, Take 20 mg by mouth every morning. , Disp: , Rfl: ;  azithromycin (ZITHROMAX) 250 MG tablet, Take 1 tablet (250 mg total) by mouth as directed., Disp: 6 tablet, Rfl: 0 ergocalciferol (VITAMIN D2) 50000 UNITS capsule, Take 50,000 Units by mouth every Friday. , Disp: , Rfl: ;  ferrous sulfate 325 (65 FE) MG  tablet, Take 325 mg by mouth daily.  , Disp: , Rfl: ;  fluticasone (FLONASE) 50 MCG/ACT nasal spray, Place 2 sprays into the nose daily., Disp: , Rfl: ;  Fluticasone-Salmeterol (ADVAIR) 100-50 MCG/DOSE AEPB, Inhale 1 puff into the lungs every 12 (twelve) hours. Rinse mouth, Disp: , Rfl:  furosemide (LASIX) 40 MG tablet, Take 40 mg by mouth every morning. , Disp: , Rfl: ;  gabapentin (NEURONTIN) 300 MG capsule, Take 300 mg by mouth 4 (four) times daily. , Disp: , Rfl: ;  HYDROcodone-acetaminophen (VICODIN) 5-500 MG per tablet, Take 1 tablet by mouth every 6 (six) hours as needed. Pain, Disp: , Rfl: ;  levothyroxine (SYNTHROID, LEVOTHROID) 125 MCG tablet, Take 125 mcg by mouth daily before breakfast. , Disp: , Rfl:  magnesium chloride (SLOW-MAG) 64 MG TBEC, Take 1 tablet by mouth 2 (two) times daily. , Disp: , Rfl: ;  methylcellulose (ARTIFICIAL TEARS) 1 % ophthalmic solution, Place 1 drop into both eyes as needed. Dry eyes, Disp: , Rfl: ;  Multiple Vitamin (MULTIVITAMIN) tablet, Take 1 tablet by mouth daily., Disp: , Rfl: ;  NON FORMULARY, CPAP 6 [employer], Disp: , Rfl: ;  omeprazole (PRILOSEC) 20 MG capsule, Take 20 mg by mouth daily. , Disp: , Rfl:  oxyCODONE-acetaminophen (PERCOCET/ROXICET) 5-325 MG per tablet, Take 1-2 tablets by mouth every 4 (four) hours as needed., Disp: 30 tablet, Rfl: 0;  potassium chloride SA (K-DUR,KLOR-CON) 20 MEQ tablet, Take 20 mEq by mouth daily. , Disp: , Rfl: ;  verapamil (CALAN) 120 MG tablet, Take 120  mg by mouth every morning. , Disp: , Rfl: ;  vitamin B-12 (CYANOCOBALAMIN) 500 MCG tablet, Take 500 mcg by mouth daily.  , Disp: , Rfl:  warfarin (COUMADIN) 10 MG tablet, Take 10 mg by mouth daily. Pt takes 1 tablet 10 mg on Monday and Wednesday.Pt take 12.5 mg on Sunday, Tuesday, Thursday, Friday, and Saturday. Pt takes 1 whole 10 mg and 0.5 tablet of 5 mg., Disp: , Rfl: ;  warfarin (COUMADIN) 5 MG tablet, Take 2.5 mg by mouth daily. Pt take 12.5 mg on Sunday, Tuesday, Thursday,  Friday, and Saturday. Pt takes 1 whole 10 mg and 0.5 tablet of 5 mg., Disp: , Rfl:   Allergies:  Allergies  Allergen Reactions  . Adhesive (Tape) Rash    Past Medical History, Surgical history, Social history, and Family History were reviewed and updated.  Review of Systems: Constitutional:  Negative for fever, chills, night sweats, anorexia, weight loss, pain. Cardiovascular: no chest pain or dyspnea on exertion Respiratory: no cough, shortness of breath, or wheezing Neurological: negative Dermatological: negative ENT: negative Skin: Negative. Gastrointestinal: no abdominal pain, change in bowel habits, or black or bloody stools Genito-Urinary: negative Hematological and Lymphatic: negative Breast: negative Musculoskeletal: negative Remaining ROS negative. Physical Exam: Blood pressure 145/80, pulse 63, temperature 98.6 F (37 C), temperature source Oral, resp. rate 18, height 5\' 10"  (1.778 m), weight 228 lb 6.4 oz (103.602 kg). ECOG: 1 General appearance: alert Head: Normocephalic, without obvious abnormality, atraumatic Neck: no adenopathy, no carotid bruit, no JVD, supple, symmetrical, trachea midline and thyroid not enlarged, symmetric, no tenderness/mass/nodules Lymph nodes: Cervical, supraclavicular, and axillary nodes normal. Heart:regular rate and rhythm, S1, S2 normal, no murmur, click, rub or gallop Lung:chest clear, no wheezing, rales, normal symmetric air entry Abdomin: soft, non-tender, without masses or organomegaly EXT:no erythema, induration, or nodules   Lab Results: Lab Results  Component Value Date   WBC 4.3 10/29/2012   HGB 12.5 10/29/2012   HCT 38.3 10/29/2012   MCV 88.7 10/29/2012   PLT 164 10/29/2012     Chemistry      Component Value Date/Time   NA 139 10/29/2012 1014   NA 137 05/11/2012 0444   K 4.6 10/29/2012 1014   K 4.4 05/11/2012 0444   CL 102 10/29/2012 1014   CL 103 05/11/2012 0444   CO2 29 10/29/2012 1014   CO2 28 05/11/2012 0444   BUN 12.8  10/29/2012 1014   BUN 9 05/11/2012 0444   CREATININE 0.8 10/29/2012 1014   CREATININE 0.59 05/11/2012 0444      Component Value Date/Time   CALCIUM 9.5 10/29/2012 1014   CALCIUM 9.0 05/11/2012 0444   ALKPHOS 106 10/29/2012 1014   ALKPHOS 64 05/11/2012 0444   AST 43* 10/29/2012 1014   AST 68* 05/11/2012 0444   ALT 54 10/29/2012 1014   ALT 118* 05/11/2012 0444   BILITOT 0.39 10/29/2012 1014   BILITOT 0.3 05/11/2012 0444       Impression and Plan: This is a 67 year old female with the following issues: 1. Multifactorial anemia.  This is predominately anemia of iron deficiency.  She is on iron replacement and continues to do quite well.  Her hemoglobin is excellent.  I am rechecking her iron stores.  Given her gastric bypass surgery she is at risk of having nutritional deficiencies and it is paramount to continue to monitor those at this time.  2. History of breast cancer.  She has no evidence of any recurrence at this time.  She  is up to speed on her mammogram and physical examination.   3. History of deep vein thrombosis, currently anticoagulated with Coumadin.  Followup will be  In 6 months.    [facility], MD 4/1/201410:57 AM

## 2012-11-05 ENCOUNTER — Encounter (INDEPENDENT_AMBULATORY_CARE_PROVIDER_SITE_OTHER): Payer: Self-pay

## 2012-11-07 ENCOUNTER — Ambulatory Visit (INDEPENDENT_AMBULATORY_CARE_PROVIDER_SITE_OTHER): Payer: Medicare Other | Admitting: Surgery

## 2012-11-07 ENCOUNTER — Other Ambulatory Visit (INDEPENDENT_AMBULATORY_CARE_PROVIDER_SITE_OTHER): Payer: Self-pay

## 2012-11-07 VITALS — BP 132/76 | HR 80 | Temp 98.4°F | Resp 18 | Ht 69.0 in | Wt 226.6 lb

## 2012-11-07 DIAGNOSIS — R109 Unspecified abdominal pain: Secondary | ICD-10-CM

## 2012-11-07 DIAGNOSIS — R1013 Epigastric pain: Secondary | ICD-10-CM | POA: Diagnosis not present

## 2012-11-07 NOTE — Progress Notes (Signed)
Kelli Martinez 67 y.o.  Body mass index is 33.45 kg/(m^2).  Patient Active Problem List  Diagnosis  . PULMONARY EMBOLISM  . ALLERGIC RHINITIS  . Allergic-infective asthma  . Obstructive sleep apnea  . BREAST CANCER, HX OF  . Left leg paresthesias  . Hypothyroid  . Low back pain  . Hypertension  . Morbid obesity  . Syncope and collapse  . Unspecified deficiency anemia  . 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 S/P laparoscopic cholecystectomy    Allergies  Allergen Reactions  . Adhesive (Tape) Rash    Past Surgical History  Procedure Laterality Date  . Abdominal hysterectomy    . Mastectomy      Right  . Gastric bypass  2011    bariatric surgery  . Ivc filter      recurrent DVT  . Bone marrow transplant  1994  . Cardiovascular stress test  05/23/2005    EF 53%  . US echocardiography  10/27/08    EF 55-60%  . Knee arthrotomy  1998  . Carpal tunnell      bil  . Cataracts    . Hiatal hernia repair    . Cholecystectomy  05/10/2012    Procedure: LAPAROSCOPIC CHOLECYSTECTOMY WITH INTRAOPERATIVE CHOLANGIOGRAM;  Surgeon: Valarie Merino, MD;  Location: [facility];  Service: General;  Laterality: N/A;  Laparoscopic Cholecystectomy with Intraoperative Charlotte Sanes, MD No diagnosis found.  [name] has been having bouts of abdominal pain and hit her and are associated with sweating and are somewhat unrelentless for a while. She's not having vomiting with these. I told her I was concerned about the possibility of an internal hernia. We discussed laparoscopy or CT scan. Certainly if she has an acute attack I want her to go to the [facility] emergency room.  She is recently had a research and is of her right arm lymphedema and is wearing a sleeve and is seeing a therapist for therapy.  We'll get a CT scan of her abdomen pelvis to look for any evidence of a internal hernia. We'll see her back after that discuss how she is doing. His weight is reflex a  weight loss of a 7 pound since her surgery. She is 226 lbs weight. Matt B. [name], MD, [facility] [phone] beeper [phone]  11/07/2012 10:54 AM

## 2012-11-11 ENCOUNTER — Ambulatory Visit
Admission: RE | Admit: 2012-11-11 | Discharge: 2012-11-11 | Disposition: A | Discharge status: Home / Self Care | Payer: Medicare Other | Source: Ambulatory Visit | Attending: Surgery | Admitting: Surgery

## 2012-11-11 ENCOUNTER — Other Ambulatory Visit (INDEPENDENT_AMBULATORY_CARE_PROVIDER_SITE_OTHER): Payer: Self-pay | Admitting: General Surgery

## 2012-11-11 DIAGNOSIS — R109 Unspecified abdominal pain: Secondary | ICD-10-CM | POA: Diagnosis not present

## 2012-11-11 DIAGNOSIS — R110 Nausea: Secondary | ICD-10-CM | POA: Diagnosis not present

## 2012-11-19 DIAGNOSIS — Z7901 Long term (current) use of anticoagulants: Secondary | ICD-10-CM | POA: Diagnosis not present

## 2012-11-19 DIAGNOSIS — E039 Hypothyroidism, unspecified: Secondary | ICD-10-CM | POA: Diagnosis not present

## 2012-11-19 DIAGNOSIS — D649 Anemia, unspecified: Secondary | ICD-10-CM | POA: Diagnosis not present

## 2012-11-19 DIAGNOSIS — R609 Edema, unspecified: Secondary | ICD-10-CM | POA: Diagnosis not present

## 2012-11-21 DIAGNOSIS — E039 Hypothyroidism, unspecified: Secondary | ICD-10-CM | POA: Diagnosis not present

## 2012-11-21 DIAGNOSIS — E119 Type 2 diabetes mellitus without complications: Secondary | ICD-10-CM | POA: Diagnosis not present

## 2012-11-21 DIAGNOSIS — Z7901 Long term (current) use of anticoagulants: Secondary | ICD-10-CM | POA: Diagnosis not present

## 2012-11-21 DIAGNOSIS — K911 Postgastric surgery syndromes: Secondary | ICD-10-CM | POA: Diagnosis not present

## 2012-11-21 DIAGNOSIS — D649 Anemia, unspecified: Secondary | ICD-10-CM | POA: Diagnosis not present

## 2012-11-21 DIAGNOSIS — R252 Cramp and spasm: Secondary | ICD-10-CM | POA: Diagnosis not present

## 2012-11-21 DIAGNOSIS — K912 Postsurgical malabsorption, not elsewhere classified: Secondary | ICD-10-CM | POA: Diagnosis not present

## 2012-11-21 DIAGNOSIS — R609 Edema, unspecified: Secondary | ICD-10-CM | POA: Diagnosis not present

## 2012-12-05 ENCOUNTER — Ambulatory Visit (INDEPENDENT_AMBULATORY_CARE_PROVIDER_SITE_OTHER): Payer: Medicare Other | Admitting: Surgery

## 2012-12-05 ENCOUNTER — Encounter (INDEPENDENT_AMBULATORY_CARE_PROVIDER_SITE_OTHER): Payer: Self-pay | Admitting: Surgery

## 2012-12-05 VITALS — BP 122/60 | HR 68 | Temp 97.3°F | Resp 16 | Ht 69.5 in | Wt 228.6 lb

## 2012-12-05 DIAGNOSIS — Z9884 Bariatric surgery status: Secondary | ICD-10-CM | POA: Diagnosis not present

## 2012-12-05 NOTE — Progress Notes (Signed)
Kelli Martinez in today in followup. I reviewed her CT scan which was basically normal. Very importantly is noted there is no evidence of return of her breast cancer. There is no swirling of her mesentery noted. She is currently asymptomatic. She's once a little more weight and she's going to see the dietitians about this is a very good idea. I plan to see her again in 6 months in routine followup. I did receive Dr. Remus Blake note and appreciate his forwarding that  to me.  Matt B. [name], MD, [facility] [phone] beeper [phone]  12/05/2012 9:16 AM

## 2012-12-20 DIAGNOSIS — E119 Type 2 diabetes mellitus without complications: Secondary | ICD-10-CM | POA: Diagnosis not present

## 2012-12-25 DIAGNOSIS — K912 Postsurgical malabsorption, not elsewhere classified: Secondary | ICD-10-CM | POA: Diagnosis not present

## 2012-12-25 DIAGNOSIS — D649 Anemia, unspecified: Secondary | ICD-10-CM | POA: Diagnosis not present

## 2012-12-25 DIAGNOSIS — Z7901 Long term (current) use of anticoagulants: Secondary | ICD-10-CM | POA: Diagnosis not present

## 2012-12-25 DIAGNOSIS — E119 Type 2 diabetes mellitus without complications: Secondary | ICD-10-CM | POA: Diagnosis not present

## 2012-12-26 DIAGNOSIS — E119 Type 2 diabetes mellitus without complications: Secondary | ICD-10-CM | POA: Diagnosis not present

## 2012-12-27 DIAGNOSIS — M1710 Unilateral primary osteoarthritis, unspecified knee: Secondary | ICD-10-CM | POA: Diagnosis not present

## 2013-01-22 DIAGNOSIS — E119 Type 2 diabetes mellitus without complications: Secondary | ICD-10-CM | POA: Diagnosis not present

## 2013-01-22 DIAGNOSIS — Z7901 Long term (current) use of anticoagulants: Secondary | ICD-10-CM | POA: Diagnosis not present

## 2013-02-13 ENCOUNTER — Telehealth: Payer: Self-pay | Admitting: Endocrinology

## 2013-02-14 NOTE — Telephone Encounter (Signed)
Left message on vm for patient to get to [facility] for coumadin check.

## 2013-03-06 ENCOUNTER — Other Ambulatory Visit: Payer: Self-pay | Admitting: *Deleted

## 2013-03-06 DIAGNOSIS — I2699 Other pulmonary embolism without acute cor pulmonale: Secondary | ICD-10-CM

## 2013-03-06 MED ORDER — HYDROCODONE-ACETAMINOPHEN 5-500 MG PO TABS: 1.0000 | ORAL_TABLET | Freq: Two times a day (BID) | ORAL | Status: DC

## 2013-03-06 MED ORDER — OMEPRAZOLE 20 MG PO CPDR: 20.0000 mg | DELAYED_RELEASE_CAPSULE | Freq: Every day | ORAL | Status: DC

## 2013-03-06 MED ORDER — FUROSEMIDE 40 MG PO TABS: 40.0000 mg | ORAL_TABLET | Freq: Every morning | ORAL | Status: DC

## 2013-03-07 ENCOUNTER — Other Ambulatory Visit: Payer: Self-pay | Admitting: *Deleted

## 2013-03-14 DIAGNOSIS — E1139 Type 2 diabetes mellitus with other diabetic ophthalmic complication: Secondary | ICD-10-CM | POA: Diagnosis not present

## 2013-03-20 ENCOUNTER — Telehealth: Payer: Self-pay | Admitting: Internal Medicine

## 2013-03-20 MED ORDER — AZITHROMYCIN 250 MG PO TABS: ORAL_TABLET | ORAL | Status: DC

## 2013-03-20 NOTE — Telephone Encounter (Signed)
Pt aware of recs. RX has been sent 

## 2013-03-20 NOTE — Telephone Encounter (Signed)
I spoke with pt. She c/o sore throat, cough, nasal congestion, PND, feels like mucus is stuck in her throat x 1 week. Denies any wheezing and not chest tx. She is using flonase and her advair. Pt did not want to come in for OV. Pt is requesting recs. CDY is off so will forward to doc of the morning for recs. Please advise SN thanks Last OV 04/23/12 04/14/13 pending  Allergies  Allergen Reactions  . Adhesive [Tape] Rash

## 2013-03-20 NOTE — Telephone Encounter (Signed)
Per SN---  zpak #1  Take as directed mucinex 600 mg  2 po bid Increase fluids Use the nasal saline spray every 1-2 hours as needed

## 2013-03-24 ENCOUNTER — Other Ambulatory Visit: Payer: Self-pay | Admitting: *Deleted

## 2013-03-24 DIAGNOSIS — E039 Hypothyroidism, unspecified: Secondary | ICD-10-CM

## 2013-03-25 ENCOUNTER — Other Ambulatory Visit (INDEPENDENT_AMBULATORY_CARE_PROVIDER_SITE_OTHER): Payer: Medicare Other

## 2013-03-25 ENCOUNTER — Other Ambulatory Visit: Payer: Medicare Other

## 2013-03-25 DIAGNOSIS — I2699 Other pulmonary embolism without acute cor pulmonale: Secondary | ICD-10-CM | POA: Diagnosis not present

## 2013-03-25 DIAGNOSIS — E039 Hypothyroidism, unspecified: Secondary | ICD-10-CM

## 2013-03-25 LAB — PROTIME-INR: INR: 3.4 ratio — ABNORMAL HIGH (ref 0.8–1.0)

## 2013-03-25 LAB — T4, FREE: Free T4: 0.9 ng/dL (ref 0.60–1.60)

## 2013-03-27 ENCOUNTER — Ambulatory Visit: Payer: Medicare Other | Admitting: Endocrinology

## 2013-04-02 ENCOUNTER — Ambulatory Visit (INDEPENDENT_AMBULATORY_CARE_PROVIDER_SITE_OTHER): Payer: Medicare Other | Admitting: Endocrinology

## 2013-04-02 ENCOUNTER — Encounter: Payer: Self-pay | Admitting: Endocrinology

## 2013-04-02 ENCOUNTER — Other Ambulatory Visit: Payer: Self-pay | Admitting: *Deleted

## 2013-04-02 VITALS — BP 112/70 | HR 79 | Temp 98.4°F | Resp 12 | Ht 70.0 in | Wt 230.0 lb

## 2013-04-02 DIAGNOSIS — E162 Hypoglycemia, unspecified: Secondary | ICD-10-CM

## 2013-04-02 DIAGNOSIS — E161 Other hypoglycemia: Secondary | ICD-10-CM | POA: Insufficient documentation

## 2013-04-02 DIAGNOSIS — K911 Postgastric surgery syndromes: Secondary | ICD-10-CM

## 2013-04-02 DIAGNOSIS — R609 Edema, unspecified: Secondary | ICD-10-CM

## 2013-04-02 DIAGNOSIS — M5412 Radiculopathy, cervical region: Secondary | ICD-10-CM

## 2013-04-02 DIAGNOSIS — R600 Localized edema: Secondary | ICD-10-CM

## 2013-04-02 DIAGNOSIS — E7800 Pure hypercholesterolemia, unspecified: Secondary | ICD-10-CM

## 2013-04-02 DIAGNOSIS — M5010 Cervical disc disorder with radiculopathy, unspecified cervical region: Secondary | ICD-10-CM

## 2013-04-02 MED ORDER — ACARBOSE 25 MG PO TABS: ORAL_TABLET | ORAL | Status: DC

## 2013-04-02 MED ORDER — METHOCARBAMOL 500 MG PO TABS: 500.0000 mg | ORAL_TABLET | Freq: Two times a day (BID) | ORAL | Status: DC

## 2013-04-02 NOTE — Patient Instructions (Addendum)
Lasix daily until swelling gone then every 2 days, continue elastic stockings in the mornings  Miralax daily  Precose 25mg , just before meals start with 1/2 tab first then increase to full tablet if tolerated. Have protein at every meal and restrict carbohydrates  Coumadin 10mg , 5 days a week and 12.5 mg 2/7 days, followup in Coumadin clinic  Robaxin 2x daily

## 2013-04-02 NOTE — Progress Notes (Signed)
Patient ID: Kelli Martinez, female   DOB: 1946-03-10, 67 y.o.   MRN: [id number]  Chief complaint: Low sugars  History of Present Illness:  The patient presents today with multiple problems and issues  1. She has had postprandial hypoglycemia since her gastric bypass surgery especially after she had lost a considerable amount of weight. She has been to the dietitian and has been instructed on balanced low-fat meals and restricting carbohydrates including fruits. However recently she is again getting low blood sugars with symptoms such as weakness, sweating, occasional confusion. Previously UR is having low sugars within 30 minutes of eating but now may have low sugars either before lunch or supper. She is doing fairly well with adding protein at every meal. Her lowest glucose has been 40 at lunchtime and also had another one at 4 PM. She did try verapamil empirically but this did not help. Was given pre-cold but did not try it previously. She has gained about 6 pounds possibly from needing to treat hypoglycemia 2.  She had not been followed by the Coumadin clinic and INR is 3.4. Currently taking Coumadin 12.5 on 5/7 and 10mg  2/7  3. She is not complaining about constipation which is relatively new and is usually taking milk of magnesia for relief. Has relatively hard stools also. Not drinking adequate amounts of water  4. She again has recurrent problem with her legs swelling especially on the left side. Has had history of DVT. The swelling is better in the morning when she wakes up. No pain or redne in the legs and the swelling extends up to the knee. Takes Lasix every other day with only mild relief  5. Previous history of hypertension: Currently not on any medications   6. History of diabetes prior to her gastric bypass surgery  7. She is complaining about periodic neck pain which may radiate to her right arm and sometimes last all day. Relieved by hydrocodone. No numbness or tingling in her  hands or any weakness     Medication List       This list is accurate as of: 04/02/13  1:48 PM.  Always use your most recent med list.               albuterol 108 (90 BASE) MCG/ACT inhaler  Commonly known as:  PROVENTIL HFA;VENTOLIN HFA  Inhale 2 puffs into the lungs 4 (four) times daily as needed. As needed rescue inhaler     atorvastatin 20 MG tablet  Commonly known as:  LIPITOR  Take 20 mg by mouth every morning.     azithromycin 250 MG tablet  Commonly known as:  ZITHROMAX  Take 1 tablet (250 mg total) by mouth as directed.     azithromycin 250 MG tablet  Commonly known as:  ZITHROMAX  Take as directed     ergocalciferol 50000 UNITS capsule  Commonly known as:  VITAMIN D2  Take 50,000 Units by mouth every Friday.     ferrous sulfate 325 (65 FE) MG tablet  Take 325 mg by mouth daily.     fluticasone 50 MCG/ACT nasal spray  Commonly known as:  FLONASE  Place 2 sprays into the nose daily.     Fluticasone-Salmeterol 100-50 MCG/DOSE Aepb  Commonly known as:  ADVAIR  Inhale 1 puff into the lungs every 12 (twelve) hours. Rinse mouth     furosemide 40 MG tablet  Commonly known as:  LASIX  Take 1 tablet (40 mg total) by mouth every morning.  gabapentin 300 MG capsule  Commonly known as:  NEURONTIN  Take 300 mg by mouth 4 (four) times daily.     HYDROcodone-acetaminophen 5-500 MG per tablet  Commonly known as:  VICODIN  Take 1 tablet by mouth 2 (two) times daily. Pain     levothyroxine 125 MCG tablet  Commonly known as:  SYNTHROID, LEVOTHROID  Take 125 mcg by mouth daily before breakfast.     magnesium chloride 64 MG Tbec SR tablet  Commonly known as:  SLOW-MAG  Take 1 tablet by mouth 2 (two) times daily.     methylcellulose 1 % ophthalmic solution  Commonly known as:  ARTIFICIAL TEARS  Place 1 drop into both eyes as needed. Dry eyes     multivitamin tablet  Take 1 tablet by mouth daily.     NON FORMULARY  CPAP 6 [facility]     omeprazole 20 MG  capsule  Commonly known as:  PRILOSEC  Take 1 capsule (20 mg total) by mouth daily.     oxyCODONE-acetaminophen 5-325 MG per tablet  Commonly known as:  PERCOCET/ROXICET  Take 1-2 tablets by mouth every 4 (four) hours as needed.     vitamin B-12 500 MCG tablet  Commonly known as:  CYANOCOBALAMIN  Take 500 mcg by mouth daily.     warfarin 10 MG tablet  Commonly known as:  COUMADIN  Take 10 mg by mouth daily. Pt takes 1 tablet 10 mg on Monday and Wednesday.Pt take 12.5 mg on Sunday, Tuesday, Thursday, Friday, and Saturday. Pt takes 1 whole 10 mg and 0.5 tablet of 5 mg.     warfarin 5 MG tablet  Commonly known as:  COUMADIN  Take 2.5 mg by mouth daily. Pt take 12.5 mg on Sunday, Tuesday, Thursday, Friday, and Saturday. Pt takes 1 whole 10 mg and 0.5 tablet of 5 mg.        Allergies:  Allergies  Allergen Reactions  . Adhesive [Tape] Rash    Past Medical History  Diagnosis Date  . Allergy   . Asthma   . Pulmonary embolism 1998 / 1994 /1968  . HX: breast cancer   . Blind right eye     hemorrhage  . Hypothyroid   . Clotting disorder   . Hypertension   . Blood transfusion   . Morbid obesity   . Syncope and collapse 2011    due to anemia  . GERD (gastroesophageal reflux disease)   . Hyperlipidemia   . Osteoporosis   . Psoriasis   . Gallstones   . Anemia   . Elevated liver enzymes   . Diabetes mellitus     hypoglycemia since large wt loss  . OSA (obstructive sleep apnea)     uses c pap  . Arthritis   . Cancer     breast 1994  . Lymphedema of arm     RT  . Back pain, chronic     GETS INJECTIONS IN BACK    Past Surgical History  Procedure Laterality Date  . Abdominal hysterectomy    . Mastectomy      Right  . Gastric bypass  2011    bariatric surgery  . Ivc filter      recurrent DVT  . Bone marrow transplant  1994  . Cardiovascular stress test  05/23/2005    EF 53%  . US echocardiography  10/27/08    EF 55-60%  . Knee arthrotomy  1998  . Carpal  tunnell  bil  . Cataracts    . Hiatal hernia repair    . Cholecystectomy  05/10/2012    Procedure: LAPAROSCOPIC CHOLECYSTECTOMY WITH INTRAOPERATIVE CHOLANGIOGRAM;  Surgeon: Valarie Merino, MD;  Location: [facility];  Service: General;  Laterality: N/A;  Laparoscopic Cholecystectomy with Intraoperative Cholangiogram    Family History  Problem Relation Age of Onset  . Sudden death Mother     car accident  . Cancer Father 74    lung  . Cancer Sister 33    lung cancer  . Asthma Daughter     Social History:  reports that she quit smoking about 34 years ago. Her smoking use included Cigarettes. She has a 5 pack-year smoking history. She does not have any smokeless tobacco history on file. She reports that she does not drink alcohol or use illicit drugs.  Review of Systems   Lymphedema right, Persistent After radical  breast surgery, using his elastic compression stocking    Will sometimes will have numbness continuing and sharp pains in her left lower leg, somewhat relieved by  Gabapentin  History of mild hypothyroidism: Adequately replaced now with 62.5 mcg  No visits with results within 1 Week(s) from this visit. Latest known visit with results is:  Appointment on 03/25/2013  Component Date Value Range Status  . Prothrombin Time 03/25/2013 34.8* 10.2 - 12.4 sec Final  . INR 03/25/2013 3.4* 0.8 - 1.0 ratio Final  . Free T4 03/25/2013 0.90  0.60 - 1.60 ng/dL Final  . TSH 11/91/4782 1.19  0.35 - 5.50 uIU/mL Final    EXAM:  BP 112/70  Pulse 79  Temp(Src) 98.4 F (36.9 C)  Resp 12  Ht 5\' 10"  (1.778 m)  Wt 230 lb (104.327 kg)  BMI 33 kg/m2  SpO2 97%  Edema complex present, 2+ on the left and 1+ on right with minimal tenderness, no warmth   Assessment/Plan:   1. Reactive/postprandial hypoglycemia from dumping syndrome. This is persistent despite dietary manipulations.  Trial of acarbose tablets before meals, to start  with 12.5 mg, discussed timing of medication,  benefits, possible side effects and titration    2. Venous leg edema not relieved by stockings: Take Lasix daily until swelling resolved then every 2 days   3. Start taking Miralax daily for constipation   4. INR 3.4: New Coumadin doses 10mg , 5 days a week and 12.5 mg 2/7 days   5. Trial of Robaxin 2x daily For neck pain, will discuss with her orthopedic surgeon if not better, avoid nonsteroidal anti-inflammatory drugs   Broox Lonigro 04/02/2013, 1:48 PM

## 2013-04-06 DIAGNOSIS — K911 Postgastric surgery syndromes: Secondary | ICD-10-CM | POA: Insufficient documentation

## 2013-04-06 DIAGNOSIS — R600 Localized edema: Secondary | ICD-10-CM | POA: Insufficient documentation

## 2013-04-14 ENCOUNTER — Ambulatory Visit: Payer: Medicare Other | Admitting: Internal Medicine

## 2013-04-15 ENCOUNTER — Ambulatory Visit (INDEPENDENT_AMBULATORY_CARE_PROVIDER_SITE_OTHER): Payer: Medicare Other | Admitting: General Practice

## 2013-04-15 DIAGNOSIS — Z7901 Long term (current) use of anticoagulants: Secondary | ICD-10-CM | POA: Insufficient documentation

## 2013-04-15 DIAGNOSIS — I2699 Other pulmonary embolism without acute cor pulmonale: Secondary | ICD-10-CM

## 2013-04-15 LAB — POCT INR: INR: 2.4

## 2013-04-17 ENCOUNTER — Ambulatory Visit: Payer: Medicare Other | Admitting: Internal Medicine

## 2013-04-21 ENCOUNTER — Encounter: Payer: Self-pay | Admitting: Internal Medicine

## 2013-04-21 ENCOUNTER — Ambulatory Visit (INDEPENDENT_AMBULATORY_CARE_PROVIDER_SITE_OTHER): Payer: Medicare Other | Admitting: Internal Medicine

## 2013-04-21 VITALS — BP 130/82 | HR 66 | Ht 69.0 in | Wt 229.8 lb

## 2013-04-21 DIAGNOSIS — G4733 Obstructive sleep apnea (adult) (pediatric): Secondary | ICD-10-CM | POA: Diagnosis not present

## 2013-04-21 DIAGNOSIS — J309 Allergic rhinitis, unspecified: Secondary | ICD-10-CM

## 2013-04-21 DIAGNOSIS — J302 Other seasonal allergic rhinitis: Secondary | ICD-10-CM

## 2013-04-21 DIAGNOSIS — G4700 Insomnia, unspecified: Secondary | ICD-10-CM

## 2013-04-21 MED ORDER — ALBUTEROL SULFATE HFA 108 (90 BASE) MCG/ACT IN AERS: 2.0000 | INHALATION_SPRAY | Freq: Four times a day (QID) | RESPIRATORY_TRACT | Status: DC | PRN

## 2013-04-21 MED ORDER — FLUTICASONE-SALMETEROL 100-50 MCG/DOSE IN AEPB: 1.0000 | INHALATION_SPRAY | Freq: Two times a day (BID) | RESPIRATORY_TRACT | Status: DC

## 2013-04-21 MED ORDER — TEMAZEPAM 15 MG PO CAPS: ORAL_CAPSULE | ORAL | Status: DC

## 2013-04-21 MED ORDER — FLUTICASONE PROPIONATE 50 MCG/ACT NA SUSP: 2.0000 | Freq: Every day | NASAL | Status: DC

## 2013-04-21 NOTE — Patient Instructions (Addendum)
We are going to stop CPAP, but can work to get a newer machine later. We can also help you to try getting fitted with an oral appliance to treat sleep apnea, instead of CPAP as another option. For now- try otc Breathe Right Nasal strips at bedtime, and try otc nasal saline spray as often as you like for stuffiness.  Script to try temazepam for your insomnia  Flu vax  Scripts to [facility] for your refills  Please call as needed

## 2013-04-21 NOTE — Progress Notes (Signed)
Subjective:    Patient ID: Kelli Martinez, female    DOB: 07/04/1946, 67 y.o.   MRN: [id number]  HPI 03/21/11- 14 yoF former smoker followed for OSA, allergic rhinitis,asthma,  hx pulm embolism/DVT/filter/coumadin(Dr Lucianne Muss), hx R breast Ca/ mastectomy/ xrt/chem/bonemarrow transplant at [facility], hx gastric bypass Last here March 22, 2010 Uses CPAP 6 [facility]- most of each night. Averages 6 hrs sleep/ night. Pressure seems right. Wakes in AM with dry mouth. Sleeps alone.  Wakes with dry mouth and stuffy nose.  Continues Advair. Dry cough can lead to asthma. Needs rescue inhaler refilled.- needed it daily while working summer garden.   04/23/12- 66 yoF former smoker followed for OSA, allergic rhinitis,asthma,  hx pulm embolism/DVT/filter/coumadin(Dr Lucianne Muss), hx R breast Ca/ mastectomy/ xrt/chem/bonemarrow transplant at [facility], hx gastric bypass Wakes up in mid of night gasping for air if not wearing CPAP; When she wears CPAP 6/ Advanced about 3-4 hours each night-nostrils and throat get too dry so unable to wear for longer periods of time; pressure seems to work well for patient.  04/21/13- 46 yoF former smoker followed for OSA, allergic rhinitis,asthma,  hx pulm embolism/DVT/filter/coumadin(Dr Lucianne Muss), hx R breast Ca/ mastectomy/ xrt/chem/bonemarrow transplant at [facility], hx gastric bypass FOLLOWS FOR:  Not wearing CPAP at all states gets sinuses too dry and throat, also loud and wakes her up Alone with no body to comment on her snoring. Insomnia, helped by gabapentin given for peripheral neuropathy. Always stuffy right nostril with no drainage. Asthma control good with Advair, occasional rescue inhaler.  Review of Systems- see HPI Constitutional:   No-   weight loss, night sweats, fevers, chills, fatigue, lassitude. HEENT:   No-  headaches, difficulty swallowing, tooth/dental problems, sore throat,   +dry mouth      No-  sneezing, itching, ear ache,  +nasal congestion, post nasal drip,  CV:  No-    chest pain, orthopnea, PND, swelling in lower extremities, anasarca, dizziness, palpitations Resp: No-   shortness of breath with exertion or at rest.              No-   productive cough,  No non-productive cough,  No-  coughing up of blood.              No-   change in color of mucus.   Skin: No-   rash or lesions. GI:  No-   heartburn, indigestion, abdominal pain, nausea, vomiting,  GU:  MS:  No-   joint pain,  +  swelling.   Neuro- :  Psych:  No- change in mood or affect. No depression or anxiety.  No memory loss.   Objective:   Physical Exam General- Alert, Oriented, Affect-appropriate, Distress- none acute  obese Skin- rash-none, lesions- none, excoriation- none. XRT skin changes right lateral neck Lymphadenopathy- none Head- atraumatic            Eyes- Gross vision intact, PERRLA, conjunctivae clear secretions            Ears- Hearing, canals normal            Nose- Clear, no- Septal dev, + sticky mucus, polyps, erosion, perforation             Throat- Mallampati II , mucosa clear , drainage- none, tonsils- atrophic, missing teeth Neck- flexible , trachea midline, no stridor , thyroid nl, carotid no bruit Chest - symmetrical excursion , unlabored           Heart/CV- RRR , no murmur , no gallop  ,  no rub, nl s1 s2                           - JVD- none , edema+L>R, stasis changes- none, varices- none           Lung- clear to P&A, wheeze- none, cough- none , dullness-none, rub- none           Chest wall-  Abd- tender-no, distended-no, bowel sounds-present, HSM- no Br/ Gen/ Rectal- Not done, not indicated Extrem- cyanosis- none, clubbing, none, atrophy- none, strength- nl   Heavy legs. +Lymphedema right forearm/ elastic sleeve Neuro- grossly intact to observation  Assessment & Plan:

## 2013-04-22 ENCOUNTER — Telehealth: Payer: Self-pay | Admitting: Internal Medicine

## 2013-04-22 NOTE — Telephone Encounter (Signed)
Pt called thinking that the Breathe Right nasal strips and saline nasal spray were rx's to call in to pharmacy. Pt is aware that both recommendations by CY are OTC. Pt will get these items tonight at pharmacy.

## 2013-04-30 ENCOUNTER — Ambulatory Visit (HOSPITAL_BASED_OUTPATIENT_CLINIC_OR_DEPARTMENT_OTHER): Payer: Medicare Other | Admitting: Oncology

## 2013-04-30 ENCOUNTER — Other Ambulatory Visit (HOSPITAL_BASED_OUTPATIENT_CLINIC_OR_DEPARTMENT_OTHER): Payer: Medicare Other | Admitting: Lab

## 2013-04-30 ENCOUNTER — Telehealth: Payer: Self-pay | Admitting: Oncology

## 2013-04-30 VITALS — BP 135/70 | HR 67 | Temp 97.9°F | Resp 18 | Ht 69.0 in | Wt 229.3 lb

## 2013-04-30 DIAGNOSIS — D649 Anemia, unspecified: Secondary | ICD-10-CM

## 2013-04-30 DIAGNOSIS — Z86718 Personal history of other venous thrombosis and embolism: Secondary | ICD-10-CM

## 2013-04-30 DIAGNOSIS — Z853 Personal history of malignant neoplasm of breast: Secondary | ICD-10-CM

## 2013-04-30 DIAGNOSIS — D539 Nutritional anemia, unspecified: Secondary | ICD-10-CM

## 2013-04-30 DIAGNOSIS — D509 Iron deficiency anemia, unspecified: Secondary | ICD-10-CM

## 2013-04-30 LAB — CBC WITH DIFFERENTIAL/PLATELET
Basophils Absolute: 0.1 10*3/uL (ref 0.0–0.1)
EOS%: 1.8 % (ref 0.0–7.0)
Eosinophils Absolute: 0.1 10*3/uL (ref 0.0–0.5)
HGB: 12.7 g/dL (ref 11.6–15.9)
MCH: 28.4 pg (ref 25.1–34.0)
MONO%: 5.9 % (ref 0.0–14.0)
NEUT#: 1.8 10*3/uL (ref 1.5–6.5)
RBC: 4.46 10*6/uL (ref 3.70–5.45)
RDW: 14 % (ref 11.2–14.5)
lymph#: 1.8 10*3/uL (ref 0.9–3.3)

## 2013-04-30 LAB — COMPREHENSIVE METABOLIC PANEL (CC13)
AST: 35 U/L — ABNORMAL HIGH (ref 5–34)
Albumin: 3.6 g/dL (ref 3.5–5.0)
Alkaline Phosphatase: 105 U/L (ref 40–150)
BUN: 12.3 mg/dL (ref 7.0–26.0)
Calcium: 9.6 mg/dL (ref 8.4–10.4)
Chloride: 103 mEq/L (ref 98–109)
Potassium: 4 mEq/L (ref 3.5–5.1)
Sodium: 141 mEq/L (ref 136–145)
Total Protein: 7.1 g/dL (ref 6.4–8.3)

## 2013-04-30 LAB — IRON AND TIBC CHCC
%SAT: 28 % (ref 21–57)
UIBC: 242 ug/dL (ref 120–384)

## 2013-04-30 LAB — FERRITIN CHCC: Ferritin: 699 ng/ml — ABNORMAL HIGH (ref 9–269)

## 2013-04-30 NOTE — Telephone Encounter (Signed)
gv pt appt schedule for April 2015.  °

## 2013-04-30 NOTE — Progress Notes (Signed)
Hematology and Oncology Follow Up Visit  Kelli Martinez [id number] [birth year] 67 y.o. 04/30/2013 10:07 AM  CC: [name], M.D.  [place] [name], MD    Principle Diagnosis:  This is a 67 year old female with the following issues: 1. Multifactorial anemia.  She has an element of iron deficiency, as well as anemia of renal disease, currently on p.o. iron replacement, and has not really required any IV iron recently. 2. History of breast cancer diagnosed in 1993.  She is status post mastectomy followed by adjuvant radiation therapy.  She had also received high-dose chemotherapy and stem-cell transplantation in 1994.  She continues to be in remission from that standpoint.   3. History of gastric bypass surgery done in April 2011.   Interim History: Ms. Tripoli presents today for a followup visit. She has reported no new symptoms since the last time I saw her. She is reporting slight fatigue but overall her energy is reasonable and she does experience any hypoglycemic episodes.  She does not report any chest pain; does not report any difficulty breathing.  For the most part her performance status and activity level remain reasonable.  Her weight is stable at this time after she has lost over 100 lbs. She is having episodes of low blood sugars at time. No bleeding noted.    Medications: I have reviewed the patient's current medications.  Current Outpatient Prescriptions  Medication Sig Dispense Refill  . acarbose (PRECOSE) 25 MG tablet Take 1/2 tablet once a day for one week, then increase to 1 tablet every day  30 tablet  5  . albuterol (PROVENTIL HFA;VENTOLIN HFA) 108 (90 BASE) MCG/ACT inhaler Inhale 2 puffs into the lungs 4 (four) times daily as needed. As needed rescue inhaler  3 Inhaler  3  . atorvastatin (LIPITOR) 20 MG tablet Take 20 mg by mouth every morning.       . ergocalciferol (VITAMIN D2) 50000 UNITS capsule Take 50,000 Units by mouth every Friday.       . ferrous sulfate 325  (65 FE) MG tablet Take 325 mg by mouth daily.        . fluticasone (FLONASE) 50 MCG/ACT nasal spray Place 2 sprays into the nose daily.  48 g  3  . Fluticasone-Salmeterol (ADVAIR) 100-50 MCG/DOSE AEPB Inhale 1 puff into the lungs every 12 (twelve) hours. Rinse mouth  180 each  3  . furosemide (LASIX) 40 MG tablet Take 1 tablet (40 mg total) by mouth every morning.  90 tablet  1  . gabapentin (NEURONTIN) 300 MG capsule Take 300 mg by mouth 4 (four) times daily.       Marland Kitchen HYDROcodone-acetaminophen (VICODIN) 5-500 MG per tablet Take 1 tablet by mouth 2 (two) times daily. Pain  180 tablet  1  . levothyroxine (SYNTHROID, LEVOTHROID) 125 MCG tablet Take 125 mcg by mouth daily before breakfast.       . magnesium chloride (SLOW-MAG) 64 MG TBEC Take 1 tablet by mouth 2 (two) times daily.       . methocarbamol (ROBAXIN) 500 MG tablet Take 1 tablet (500 mg total) by mouth 2 (two) times daily.  60 tablet  0  . methylcellulose (ARTIFICIAL TEARS) 1 % ophthalmic solution Place 1 drop into both eyes as needed. Dry eyes      . Multiple Vitamin (MULTIVITAMIN) tablet Take 1 tablet by mouth daily.      . NON FORMULARY CPAP 6 [facility]      . omeprazole (PRILOSEC) 20 MG capsule  Take 1 capsule (20 mg total) by mouth daily.  90 capsule  1  . oxyCODONE-acetaminophen (PERCOCET/ROXICET) 5-325 MG per tablet Take 1-2 tablets by mouth every 4 (four) hours as needed.  30 tablet  0  . temazepam (RESTORIL) 15 MG capsule 1-2 for sleep as needed  30 capsule  1  . vitamin B-12 (CYANOCOBALAMIN) 500 MCG tablet Take 500 mcg by mouth daily.        Marland Kitchen warfarin (COUMADIN) 10 MG tablet Take 10 mg by mouth daily. Pt takes 1 tablet 10 mg on Monday and Wednesday.Pt take 12.5 mg on Sunday, Tuesday, Thursday, Friday, and Saturday. Pt takes 1 whole 10 mg and 0.5 tablet of 5 mg.      . warfarin (COUMADIN) 5 MG tablet Take 2.5 mg by mouth daily. Pt take 12.5 mg on Sunday, Tuesday, Thursday, Friday, and Saturday. Pt takes 1 whole 10 mg and 0.5 tablet of  5 mg.       No current facility-administered medications for this visit.     Allergies:  Allergies  Allergen Reactions  . Adhesive [Tape] Rash    Past Medical History, Surgical history, Social history, and Family History were reviewed and updated.  Review of Systems:  Remaining ROS negative. Physical Exam: Blood pressure 135/70, pulse 67, temperature 97.9 F (36.6 C), temperature source Oral, resp. rate 18, height 5\' 9"  (1.753 m), weight 229 lb 4.8 oz (104.01 kg). ECOG: 1 General appearance: alert Head: Normocephalic, without obvious abnormality, atraumatic Neck: no adenopathy, no carotid bruit, no JVD, supple, symmetrical, trachea midline and thyroid not enlarged, symmetric, no tenderness/mass/nodules Lymph nodes: Cervical, supraclavicular, and axillary nodes normal. Heart:regular rate and rhythm, S1, S2 normal, no murmur, click, rub or gallop Lung:chest clear, no wheezing, rales, normal symmetric air entry Abdomin: soft, non-tender, without masses or organomegaly EXT:no erythema, induration, or nodules   Lab Results: Lab Results  Component Value Date   WBC 3.9 04/30/2013   HGB 12.7 04/30/2013   HCT 39.2 04/30/2013   MCV 88.1 04/30/2013   PLT 169 04/30/2013     Chemistry      Component Value Date/Time   NA 139 10/29/2012 1014   NA 137 05/11/2012 0444   K 4.6 10/29/2012 1014   K 4.4 05/11/2012 0444   CL 102 10/29/2012 1014   CL 103 05/11/2012 0444   CO2 29 10/29/2012 1014   CO2 28 05/11/2012 0444   BUN 12.8 10/29/2012 1014   BUN 9 05/11/2012 0444   CREATININE 0.8 10/29/2012 1014   CREATININE 0.59 05/11/2012 0444      Component Value Date/Time   CALCIUM 9.5 10/29/2012 1014   CALCIUM 9.0 05/11/2012 0444   ALKPHOS 106 10/29/2012 1014   ALKPHOS 64 05/11/2012 0444   AST 43* 10/29/2012 1014   AST 68* 05/11/2012 0444   ALT 54 10/29/2012 1014   ALT 118* 05/11/2012 0444   BILITOT 0.39 10/29/2012 1014   BILITOT 0.3 05/11/2012 0444       Impression and Plan: This is a 67 year old  female with the following issues: 1. Multifactorial anemia.  This is predominately anemia of iron deficiency.  She is on iron replacement and continues to do quite well.  Her hemoglobin is excellent.  I am rechecking her iron stores.  Given her gastric bypass surgery she is at risk of having nutritional deficiencies and it is paramount to continue to monitor those at this time.  2. History of breast cancer.  She has no evidence of any recurrence at this  time.  She is up to speed on her mammogram and physical examination.   3. History of deep vein thrombosis, currently anticoagulated with Coumadin.  Followup will be  In 6 months.    Mariza Bourget, MD 10/1/201410:07 AM

## 2013-05-01 DIAGNOSIS — G4700 Insomnia, unspecified: Secondary | ICD-10-CM | POA: Insufficient documentation

## 2013-05-01 NOTE — Assessment & Plan Note (Signed)
Plan-try temazepam, education done

## 2013-05-01 NOTE — Assessment & Plan Note (Signed)
She is not motivated to continue CPAP without somebody there reinforced that she snores without it. I encouraged weight loss and sleep off flat of back.

## 2013-05-01 NOTE — Assessment & Plan Note (Signed)
Increased nasal congestion. Plan-nasal strips, nasal saline spray, continue Flonase used every night

## 2013-05-05 DIAGNOSIS — M542 Cervicalgia: Secondary | ICD-10-CM | POA: Diagnosis not present

## 2013-05-05 DIAGNOSIS — M5137 Other intervertebral disc degeneration, lumbosacral region: Secondary | ICD-10-CM | POA: Diagnosis not present

## 2013-05-05 DIAGNOSIS — M5030 Other cervical disc degeneration, unspecified cervical region: Secondary | ICD-10-CM | POA: Diagnosis not present

## 2013-05-05 DIAGNOSIS — M545 Low back pain: Secondary | ICD-10-CM | POA: Diagnosis not present

## 2013-05-07 ENCOUNTER — Other Ambulatory Visit (INDEPENDENT_AMBULATORY_CARE_PROVIDER_SITE_OTHER): Payer: Medicare Other

## 2013-05-07 ENCOUNTER — Other Ambulatory Visit: Payer: Medicare Other

## 2013-05-07 DIAGNOSIS — E7800 Pure hypercholesterolemia, unspecified: Secondary | ICD-10-CM

## 2013-05-07 DIAGNOSIS — R600 Localized edema: Secondary | ICD-10-CM

## 2013-05-07 DIAGNOSIS — R609 Edema, unspecified: Secondary | ICD-10-CM

## 2013-05-07 DIAGNOSIS — E162 Hypoglycemia, unspecified: Secondary | ICD-10-CM

## 2013-05-07 DIAGNOSIS — E161 Other hypoglycemia: Secondary | ICD-10-CM

## 2013-05-07 LAB — URINALYSIS, ROUTINE W REFLEX MICROSCOPIC
Bilirubin Urine: NEGATIVE
Ketones, ur: NEGATIVE
Specific Gravity, Urine: <=1.005 (ref 1.000–1.030)
Total Protein, Urine: NEGATIVE
pH: 6 (ref 5.0–8.0)

## 2013-05-07 LAB — COMPREHENSIVE METABOLIC PANEL
ALT: 43 U/L — ABNORMAL HIGH (ref 0–35)
AST: 38 U/L — ABNORMAL HIGH (ref 0–37)
Albumin: 3.7 g/dL (ref 3.5–5.2)
CO2: 29 mEq/L (ref 19–32)
Calcium: 9.2 mg/dL (ref 8.4–10.5)
Chloride: 101 mEq/L (ref 96–112)
Creatinine, Ser: 0.9 mg/dL (ref 0.4–1.2)
GFR: 83.93 mL/min (ref >60.00)
Potassium: 3.7 mEq/L (ref 3.5–5.1)
Sodium: 140 mEq/L (ref 135–145)
Total Protein: 6.6 g/dL (ref 6.0–8.3)

## 2013-05-07 LAB — HEMOGLOBIN A1C: Hgb A1c MFr Bld: 6.6 % — ABNORMAL HIGH (ref 4.6–6.5)

## 2013-05-07 LAB — LIPID PANEL
HDL: 102.5 mg/dL (ref >39.00)
Total CHOL/HDL Ratio: 2

## 2013-05-14 ENCOUNTER — Encounter: Payer: Self-pay | Admitting: Endocrinology

## 2013-05-14 ENCOUNTER — Ambulatory Visit (INDEPENDENT_AMBULATORY_CARE_PROVIDER_SITE_OTHER): Payer: Medicare Other | Admitting: General Practice

## 2013-05-14 ENCOUNTER — Ambulatory Visit (INDEPENDENT_AMBULATORY_CARE_PROVIDER_SITE_OTHER): Payer: Medicare Other | Admitting: Endocrinology

## 2013-05-14 ENCOUNTER — Other Ambulatory Visit: Payer: Self-pay | Admitting: *Deleted

## 2013-05-14 VITALS — BP 122/70 | HR 55 | Temp 98.4°F | Resp 12 | Ht 70.0 in | Wt 233.3 lb

## 2013-05-14 DIAGNOSIS — I2699 Other pulmonary embolism without acute cor pulmonale: Secondary | ICD-10-CM

## 2013-05-14 DIAGNOSIS — Z5181 Encounter for therapeutic drug level monitoring: Secondary | ICD-10-CM | POA: Diagnosis not present

## 2013-05-14 DIAGNOSIS — E039 Hypothyroidism, unspecified: Secondary | ICD-10-CM

## 2013-05-14 DIAGNOSIS — K911 Postgastric surgery syndromes: Secondary | ICD-10-CM

## 2013-05-14 DIAGNOSIS — R7309 Other abnormal glucose: Secondary | ICD-10-CM

## 2013-05-14 DIAGNOSIS — M5030 Other cervical disc degeneration, unspecified cervical region: Secondary | ICD-10-CM | POA: Diagnosis not present

## 2013-05-14 DIAGNOSIS — R600 Localized edema: Secondary | ICD-10-CM

## 2013-05-14 DIAGNOSIS — Z7901 Long term (current) use of anticoagulants: Secondary | ICD-10-CM

## 2013-05-14 DIAGNOSIS — R609 Edema, unspecified: Secondary | ICD-10-CM

## 2013-05-14 DIAGNOSIS — R7303 Prediabetes: Secondary | ICD-10-CM

## 2013-05-14 MED ORDER — ACARBOSE 25 MG PO TABS: ORAL_TABLET | ORAL | Status: DC

## 2013-05-14 NOTE — Patient Instructions (Signed)
Start 1/2 acarbose before Bfst and lunch and 1 week later full tab, 3x daily

## 2013-05-14 NOTE — Progress Notes (Signed)
Patient ID: Kelli Martinez, female   DOB: [birth year], 67 y.o.   MRN: [id number]  Chief complaint: Low sugars  History of Present Illness:  The patient is back for followup, was having multiple problems on the previous visit  1. She has had postprandial hypoglycemia since her gastric bypass surgery especially after she had lost a considerable amount of weight. She has been to the dietitian and has been instructed on balanced low-fat meals and restricting carbohydrates including fruits. She did try verapamil empirically but this did not help. On 25mg  acarbose acs and has tolerated this. Did not increase the dose to be taken with her other meals as directed. Once felt shaky and dizzy with a sugar of 78, has overall fewer spells and less significant. She is getting more snacks to prevent hyperglycemia and is starting to gain weight. She is not doing any exercise but is walking more at work  2.  She had not been followed by the Coumadin clinic and INR is 2.0.       Medication List       This list is accurate as of: 05/14/13  4:36 PM.  Always use your most recent med list.               acarbose 25 MG tablet  Commonly known as:  PRECOSE  Take 1/2 tablet once a day for one week, then increase to 1 tablet every day     albuterol 108 (90 BASE) MCG/ACT inhaler  Commonly known as:  PROVENTIL HFA;VENTOLIN HFA  Inhale 2 puffs into the lungs 4 (four) times daily as needed. As needed rescue inhaler     atorvastatin 20 MG tablet  Commonly known as:  LIPITOR  Take 20 mg by mouth every morning.     ergocalciferol 50000 UNITS capsule  Commonly known as:  VITAMIN D2  Take 50,000 Units by mouth every Friday.     ferrous sulfate 325 (65 FE) MG tablet  Take 325 mg by mouth daily.     fluticasone 50 MCG/ACT nasal spray  Commonly known as:  FLONASE  Place 2 sprays into the nose daily.     Fluticasone-Salmeterol 100-50 MCG/DOSE Aepb  Commonly known as:  ADVAIR  Inhale 1 puff into the lungs  every 12 (twelve) hours. Rinse mouth     furosemide 40 MG tablet  Commonly known as:  LASIX  Take 1 tablet (40 mg total) by mouth every morning.     gabapentin 300 MG capsule  Commonly known as:  NEURONTIN  Take 300 mg by mouth 4 (four) times daily.     HYDROcodone-acetaminophen 5-500 MG per tablet  Commonly known as:  VICODIN  Take 1 tablet by mouth 2 (two) times daily. Pain     levothyroxine 125 MCG tablet  Commonly known as:  SYNTHROID, LEVOTHROID  Take 125 mcg by mouth daily before breakfast.     magnesium chloride 64 MG Tbec SR tablet  Commonly known as:  SLOW-MAG  Take 1 tablet by mouth 2 (two) times daily.     methocarbamol 500 MG tablet  Commonly known as:  ROBAXIN  Take 1 tablet (500 mg total) by mouth 2 (two) times daily.     methylcellulose 1 % ophthalmic solution  Commonly known as:  ARTIFICIAL TEARS  Place 1 drop into both eyes as needed. Dry eyes     multivitamin tablet  Take 1 tablet by mouth daily.     NON FORMULARY  CPAP 6 [employer]  omeprazole 20 MG capsule  Commonly known as:  PRILOSEC  Take 1 capsule (20 mg total) by mouth daily.     oxyCODONE-acetaminophen 5-325 MG per tablet  Commonly known as:  PERCOCET/ROXICET  Take 1-2 tablets by mouth every 4 (four) hours as needed.     temazepam 15 MG capsule  Commonly known as:  RESTORIL  1-2 for sleep as needed     vitamin B-12 500 MCG tablet  Commonly known as:  CYANOCOBALAMIN  Take 500 mcg by mouth daily.     warfarin 10 MG tablet  Commonly known as:  COUMADIN  Take 10 mg by mouth daily. Pt takes 1 tablet 10 mg on Monday and Wednesday.Pt take 12.5 mg on Sunday, Tuesday, Thursday, Friday, and Saturday. Pt takes 1 whole 10 mg and 0.5 tablet of 5 mg.     warfarin 5 MG tablet  Commonly known as:  COUMADIN  Take 2.5 mg by mouth daily. Pt take 12.5 mg on Sunday, Tuesday, Thursday, Friday, and Saturday. Pt takes 1 whole 10 mg and 0.5 tablet of 5 mg.        Allergies:  Allergies  Allergen  Reactions  . Adhesive [Tape] Rash    Past Medical History  Diagnosis Date  . Allergy   . Asthma   . Pulmonary embolism 1998 / 1994 /1968  . HX: breast cancer   . Blind right eye     hemorrhage  . Hypothyroid   . Clotting disorder   . Hypertension   . Blood transfusion   . Morbid obesity   . Syncope and collapse 2011    due to anemia  . GERD (gastroesophageal reflux disease)   . Hyperlipidemia   . Osteoporosis   . Psoriasis   . Gallstones   . Anemia   . Elevated liver enzymes   . Diabetes mellitus     hypoglycemia since large wt loss  . OSA (obstructive sleep apnea)     uses c pap  . Arthritis   . Cancer     breast 1994  . Lymphedema of arm     RT  . Back pain, chronic     GETS INJECTIONS IN BACK    Past Surgical History  Procedure Laterality Date  . Abdominal hysterectomy    . Mastectomy      Right  . Gastric bypass  2011    bariatric surgery  . Ivc filter      recurrent DVT  . Bone marrow transplant  1994  . Cardiovascular stress test  05/23/2005    EF 53%  . US echocardiography  10/27/08    EF 55-60%  . Knee arthrotomy  1998  . Carpal tunnell      bil  . Cataracts    . Hiatal hernia repair    . Cholecystectomy  05/10/2012    Procedure: LAPAROSCOPIC CHOLECYSTECTOMY WITH INTRAOPERATIVE CHOLANGIOGRAM;  Surgeon: Valarie Merino, MD;  Location: [facility];  Service: General;  Laterality: N/A;  Laparoscopic Cholecystectomy with Intraoperative Cholangiogram    Family History  Problem Relation Age of Onset  . Sudden death Mother     car accident  . Cancer Father 28    lung  . Cancer Sister 52    lung cancer  . Asthma Daughter     Social History:  reports that she quit smoking about 34 years ago. Her smoking use included Cigarettes. She has a 5 pack-year smoking history. She does not have any smokeless tobacco history on file.  She reports that she does not drink alcohol or use illicit drugs.  Review of Systems   Lymphedema right, Persistent After  radical  breast surgery, using  elastic compression stocking    Has had sharp pains in her left lower leg, somewhat relieved by  Gabapentin  History of mild hypothyroidism: Adequately replaced  with 62.5 mcg  Lab Results  Component Value Date   TSH 1.19 03/25/2013   Hypercholesterolemia in the past: LDL now only 66   She again has recurrent problem with her legs swelling especially on the left side. Has had history of DVT. The swelling is better in the morning when she wakes up. The swelling extends up to the knee. Takes Lasix daily now and is having less problems  . Previous history of hypertension: Currently not on any medications. Also previously had orthostatic dizziness    LABS:  Anti-coag visit on 05/14/2013  Component Date Value Range Status  . INR 05/14/2013 2.0   Final  Appointment on 05/07/2013  Component Date Value Range Status  . Sodium 05/07/2013 140  135 - 145 mEq/L Final  . Potassium 05/07/2013 3.7  3.5 - 5.1 mEq/L Final  . Chloride 05/07/2013 101  96 - 112 mEq/L Final  . CO2 05/07/2013 29  19 - 32 mEq/L Final  . Glucose, Bld 05/07/2013 152* 70 - 99 mg/dL Final  . BUN 16/05/9603 13  6 - 23 mg/dL Final  . Creatinine, Ser 05/07/2013 0.9  0.4 - 1.2 mg/dL Final  . Total Bilirubin 05/07/2013 0.4  0.3 - 1.2 mg/dL Final  . Alkaline Phosphatase 05/07/2013 84  39 - 117 U/L Final  . AST 05/07/2013 38* 0 - 37 U/L Final  . ALT 05/07/2013 43* 0 - 35 U/L Final  . Total Protein 05/07/2013 6.6  6.0 - 8.3 g/dL Final  . Albumin 54/03/8118 3.7  3.5 - 5.2 g/dL Final  . Calcium 14/78/2956 9.2  8.4 - 10.5 mg/dL Final  . GFR 21/30/8657 83.93  >60.00 mL/min Final  . Cholesterol 05/07/2013 178  0 - 200 mg/dL Final   ATP III Classification       Desirable:  < 200 mg/dL               Borderline High:  200 - 239 mg/dL          High:  > = 846 mg/dL  . Triglycerides 05/07/2013 48.0  0.0 - 149.0 mg/dL Final   Normal:  <962 mg/dLBorderline High:  150 - 199 mg/dL  . HDL 05/07/2013 102.50   >39.00 mg/dL Final  . VLDL 95/28/4132 9.6  0.0 - 44.0 mg/dL Final  . LDL Cholesterol 05/07/2013 66  0 - 99 mg/dL Final  . Total CHOL/HDL Ratio 05/07/2013 2   Final                  Men          Women1/2 Average Risk     3.4          3.3Average Risk          5.0          4.42X Average Risk          9.6          7.13X Average Risk          15.0          11.0                      .  Hemoglobin A1C 05/07/2013 6.6* 4.6 - 6.5 % Final   Glycemic Control Guidelines for People with Diabetes:Non Diabetic:  <6%Goal of Therapy: <7%Additional Action Suggested:  >8%   . Color, Urine 05/07/2013 LT. YELLOW  Yellow;Lt. Yellow Final  . APPearance 05/07/2013 CLEAR  Clear Final  . Specific Gravity, Urine 05/07/2013 <=1.005  1.000 - 1.030 Final  . pH 05/07/2013 6.0  5.0 - 8.0 Final  . Total Protein, Urine 05/07/2013 NEGATIVE  Negative Final  . Urine Glucose 05/07/2013 NEGATIVE  Negative Final  . Ketones, ur 05/07/2013 NEGATIVE  Negative Final  . Bilirubin Urine 05/07/2013 NEGATIVE  Negative Final  . Hgb urine dipstick 05/07/2013 TRACE-LYSED  Negative Final  . Urobilinogen, UA 05/07/2013 0.2  0.0 - 1.0 Final  . Leukocytes, UA 05/07/2013 SMALL  Negative Final  . Nitrite 05/07/2013 NEGATIVE  Negative Final  . WBC, UA 05/07/2013 0-2/hpf  0-2/hpf Final  . RBC / HPF 05/07/2013 0-2/hpf  0-2/hpf Final  . Squamous Epithelial / LPF 05/07/2013 Rare(0-4/hpf)  Rare(0-4/hpf) Final  Appointment on 04/30/2013  Component Date Value Range Status  . WBC 04/30/2013 3.9  3.9 - 10.3 10e3/uL Final  . NEUT# 04/30/2013 1.8  1.5 - 6.5 10e3/uL Final  . HGB 04/30/2013 12.7  11.6 - 15.9 g/dL Final  . HCT 16/05/9603 39.2  34.8 - 46.6 % Final  . Platelets 04/30/2013 169  145 - 400 10e3/uL Final  . MCV 04/30/2013 88.1  79.5 - 101.0 fL Final  . MCH 04/30/2013 28.4  25.1 - 34.0 pg Final  . MCHC 04/30/2013 32.3  31.5 - 36.0 g/dL Final  . RBC 54/03/8118 4.46  3.70 - 5.45 10e6/uL Final  . RDW 04/30/2013 14.0  11.2 - 14.5 % Final  . lymph#  04/30/2013 1.8  0.9 - 3.3 10e3/uL Final  . MONO# 04/30/2013 0.2  0.1 - 0.9 10e3/uL Final  . Eosinophils Absolute 04/30/2013 0.1  0.0 - 0.5 10e3/uL Final  . Basophils Absolute 04/30/2013 0.1  0.0 - 0.1 10e3/uL Final  . NEUT% 04/30/2013 45.2  38.4 - 76.8 % Final  . LYMPH% 04/30/2013 45.7  14.0 - 49.7 % Final  . MONO% 04/30/2013 5.9  0.0 - 14.0 % Final  . EOS% 04/30/2013 1.8  0.0 - 7.0 % Final  . BASO% 04/30/2013 1.4  0.0 - 2.0 % Final  . Sodium 04/30/2013 141  136 - 145 mEq/L Final  . Potassium 04/30/2013 4.0  3.5 - 5.1 mEq/L Final  . Chloride 04/30/2013 103  98 - 109 mEq/L Final  . CO2 04/30/2013 28  22 - 29 mEq/L Final  . Glucose 04/30/2013 102  70 - 140 mg/dl Final  . BUN 14/78/2956 12.3  7.0 - 26.0 mg/dL Final  . Creatinine 21/30/8657 0.8  0.6 - 1.1 mg/dL Final  . Total Bilirubin 04/30/2013 0.43  0.20 - 1.20 mg/dL Final  . Alkaline Phosphatase 04/30/2013 105  40 - 150 U/L Final  . AST 04/30/2013 35* 5 - 34 U/L Final  . ALT 04/30/2013 42  0 - 55 U/L Final  . Total Protein 04/30/2013 7.1  6.4 - 8.3 g/dL Final  . Albumin 84/69/6295 3.6  3.5 - 5.0 g/dL Final  . Calcium 28/41/3244 9.6  8.4 - 10.4 mg/dL Final  . Ferritin 08/02/7251 699* 9 - 269 ng/ml Final  . Iron 04/30/2013 93  41 - 142 ug/dL Final  . TIBC 66/44/0347 335  236 - 444 ug/dL Final  . UIBC 42/59/5638 242  120 - 384 ug/dL Final  . %SAT 75/64/3329  28  21 - 57 % Final  Anti-coag visit on 04/15/2013  Component Date Value Range Status  . INR 04/15/2013 2.4   Final     EXAM:  BP 122/70  Pulse 55  Temp(Src) 98.4 F (36.9 C)  Resp 12  Ht 5\' 10"  (1.778 m)  Wt 233 lb 4.8 oz (105.824 kg)  BMI 33.48 kg/m2  SpO2 93%     Assessment/Plan:   1. Reactive/postprandial hypoglycemia from dumping syndrome. She has improved with adding only 25 mg acarbose. However since she is still having occasional symptoms and has low normal blood sugars at times will continue increasing the dose to be taken at breakfast and lunch. Also  appears to be eating more snacks and this may be causing some weight gain She will start  with 12.5 mg and then increase the dose to 25 mg at those meals if tolerated and having no GI side effects. She will continue having balanced meals with protein   2. Venous leg edema not relieved by stockings: Take Lasix as needed   3. Hypothyroidism: Will need followup TSH on next visit  Chrishon Martino 05/14/2013, 4:36 PM

## 2013-05-15 ENCOUNTER — Other Ambulatory Visit: Payer: Self-pay | Admitting: *Deleted

## 2013-05-15 MED ORDER — LEVOTHYROXINE SODIUM 125 MCG PO TABS: 125.0000 ug | ORAL_TABLET | Freq: Every day | ORAL | Status: DC

## 2013-05-15 MED ORDER — HYDROCODONE-ACETAMINOPHEN 5-500 MG PO TABS: 1.0000 | ORAL_TABLET | Freq: Two times a day (BID) | ORAL | Status: DC

## 2013-05-18 ENCOUNTER — Other Ambulatory Visit: Payer: Self-pay | Admitting: Endocrinology

## 2013-05-18 NOTE — Addendum Note (Signed)
Addended by: [name] on: 05/18/2013 03:01 PM   Modules accepted: Orders

## 2013-05-26 DIAGNOSIS — M5030 Other cervical disc degeneration, unspecified cervical region: Secondary | ICD-10-CM | POA: Diagnosis not present

## 2013-06-06 ENCOUNTER — Encounter (INDEPENDENT_AMBULATORY_CARE_PROVIDER_SITE_OTHER): Payer: Self-pay | Admitting: Surgery

## 2013-06-06 ENCOUNTER — Encounter (INDEPENDENT_AMBULATORY_CARE_PROVIDER_SITE_OTHER): Payer: Self-pay

## 2013-06-06 ENCOUNTER — Ambulatory Visit (INDEPENDENT_AMBULATORY_CARE_PROVIDER_SITE_OTHER): Payer: Medicare Other | Admitting: Surgery

## 2013-06-06 VITALS — BP 128/70 | HR 72 | Temp 98.2°F | Resp 15 | Ht 70.0 in | Wt 231.6 lb

## 2013-06-06 DIAGNOSIS — Z9884 Bariatric surgery status: Secondary | ICD-10-CM

## 2013-06-06 NOTE — Progress Notes (Signed)
Kelli Martinez 67 y.o.  Body mass index is 33.23 kg/(m^2).  Patient Active Problem List   Diagnosis Date Noted  . Insomnia 05/01/2013  . Long term (current) use of anticoagulants 04/15/2013  . Postsurgical dumping syndrome 04/06/2013  . Bilateral leg edema 04/06/2013  . Reactive hypoglycemia 04/02/2013  . Abdominal pain intermittent-evalulating for internal hernia 11/07/2012  . S/P laparoscopic cholecystectomy 05/31/2012  . Roux Y Gastric Bypass April 2011; repair [facility] 11/22/2011  . Unspecified deficiency anemia 11/14/2011  . Left leg paresthesias 10/27/2010  . Hypothyroid 10/27/2010  . Low back pain 10/27/2010  . Obstructive sleep apnea 09/29/2007  . PULMONARY EMBOLISM 09/25/2007  . Seasonal and perennial allergic rhinitis 09/25/2007  . Allergic-infective asthma 09/25/2007  . BREAST CANCER, HX OF 09/25/2007    Allergies  Allergen Reactions  . Adhesive [Tape] Rash    Past Surgical History  Procedure Laterality Date  . Abdominal hysterectomy    . Mastectomy      Right  . Gastric bypass  2011    bariatric surgery  . Ivc filter      recurrent DVT  . Bone marrow transplant  1994  . Cardiovascular stress test  05/23/2005    EF 53%  . US echocardiography  10/27/08    EF 55-60%  . Knee arthrotomy  1998  . Carpal tunnell      bil  . Cataracts    . Hiatal hernia repair    . Cholecystectomy  05/10/2012    Procedure: LAPAROSCOPIC CHOLECYSTECTOMY WITH INTRAOPERATIVE CHOLANGIOGRAM;  Surgeon: Valarie Merino, MD;  Location: [facility];  Service: General;  Laterality: N/A;  Laparoscopic Cholecystectomy with Intraoperative Charlotte Sanes, MD No diagnosis found.  Swelling in arms and legs persists. Doing well.  Incisions OK.   Will see her back in April which will be 4 years postop.   Matt B. [name], MD, [facility] [phone] beeper [phone]  06/06/2013 5:02 PM

## 2013-06-06 NOTE — Patient Instructions (Signed)
Thanks for your patience.  If you need further assistance after leaving the office, please call our office and speak with a [facility] nurse.  [phone].  If you want to leave a message for Dr. Jaileen Janelle, please call his office phone at [phone]. 

## 2013-06-11 ENCOUNTER — Ambulatory Visit (INDEPENDENT_AMBULATORY_CARE_PROVIDER_SITE_OTHER): Payer: Medicare Other | Admitting: General Practice

## 2013-06-11 DIAGNOSIS — Z7901 Long term (current) use of anticoagulants: Secondary | ICD-10-CM

## 2013-06-11 DIAGNOSIS — I2699 Other pulmonary embolism without acute cor pulmonale: Secondary | ICD-10-CM | POA: Diagnosis not present

## 2013-06-11 NOTE — Progress Notes (Signed)
Pre-visit discussion using our clinic review tool. No additional management support is needed unless otherwise documented below in the visit note.  

## 2013-06-13 DIAGNOSIS — M5137 Other intervertebral disc degeneration, lumbosacral region: Secondary | ICD-10-CM | POA: Diagnosis not present

## 2013-06-13 DIAGNOSIS — M5030 Other cervical disc degeneration, unspecified cervical region: Secondary | ICD-10-CM | POA: Diagnosis not present

## 2013-06-13 DIAGNOSIS — M542 Cervicalgia: Secondary | ICD-10-CM | POA: Diagnosis not present

## 2013-06-20 ENCOUNTER — Ambulatory Visit (INDEPENDENT_AMBULATORY_CARE_PROVIDER_SITE_OTHER): Payer: Medicare Other | Admitting: General Practice

## 2013-06-20 DIAGNOSIS — I2699 Other pulmonary embolism without acute cor pulmonale: Secondary | ICD-10-CM | POA: Diagnosis not present

## 2013-06-20 DIAGNOSIS — Z7901 Long term (current) use of anticoagulants: Secondary | ICD-10-CM

## 2013-06-20 LAB — POCT INR: INR: 1.6

## 2013-06-20 NOTE — Progress Notes (Signed)
Pre-visit discussion using our clinic review tool. No additional management support is needed unless otherwise documented below in the visit note.  

## 2013-06-23 ENCOUNTER — Other Ambulatory Visit: Payer: Self-pay | Admitting: *Deleted

## 2013-06-23 ENCOUNTER — Other Ambulatory Visit: Payer: Self-pay | Admitting: Endocrinology

## 2013-06-23 MED ORDER — MAGNESIUM CHLORIDE 64 MG PO TBEC: 1.0000 | DELAYED_RELEASE_TABLET | Freq: Two times a day (BID) | ORAL | Status: DC

## 2013-07-02 ENCOUNTER — Other Ambulatory Visit: Payer: Self-pay | Admitting: *Deleted

## 2013-07-02 ENCOUNTER — Telehealth: Payer: Self-pay | Admitting: *Deleted

## 2013-07-02 DIAGNOSIS — R252 Cramp and spasm: Secondary | ICD-10-CM

## 2013-07-02 NOTE — Telephone Encounter (Signed)
Pt was having "terrible" cramps through out her whole body for the last week.  She took a potassium pill last night and is feeling better, she wants to make sure it's ok to go back on them. Please advise

## 2013-07-02 NOTE — Telephone Encounter (Signed)
Labs ordered.

## 2013-07-02 NOTE — Telephone Encounter (Signed)
Would like to check her potassium and magnesium before starting daily dosage, will need to order BMP and magnesium

## 2013-07-04 ENCOUNTER — Other Ambulatory Visit (INDEPENDENT_AMBULATORY_CARE_PROVIDER_SITE_OTHER): Payer: Medicare Other

## 2013-07-04 ENCOUNTER — Ambulatory Visit (INDEPENDENT_AMBULATORY_CARE_PROVIDER_SITE_OTHER): Payer: Medicare Other | Admitting: General Practice

## 2013-07-04 DIAGNOSIS — I2699 Other pulmonary embolism without acute cor pulmonale: Secondary | ICD-10-CM

## 2013-07-04 DIAGNOSIS — Z7901 Long term (current) use of anticoagulants: Secondary | ICD-10-CM | POA: Diagnosis not present

## 2013-07-04 DIAGNOSIS — R252 Cramp and spasm: Secondary | ICD-10-CM | POA: Diagnosis not present

## 2013-07-04 LAB — BASIC METABOLIC PANEL
CO2: 28 mEq/L (ref 19–32)
Chloride: 105 mEq/L (ref 96–112)
Creatinine, Ser: 0.9 mg/dL (ref 0.4–1.2)
Glucose, Bld: 169 mg/dL — ABNORMAL HIGH (ref 70–99)
Potassium: 3.9 mEq/L (ref 3.5–5.1)
Sodium: 138 mEq/L (ref 135–145)

## 2013-07-04 LAB — MAGNESIUM: Magnesium: 2.2 mg/dL (ref 1.5–2.5)

## 2013-07-04 LAB — POCT INR: INR: 1.7

## 2013-07-04 NOTE — Progress Notes (Signed)
Pre-visit discussion using our clinic review tool. No additional management support is needed unless otherwise documented below in the visit note.  

## 2013-07-08 ENCOUNTER — Ambulatory Visit (INDEPENDENT_AMBULATORY_CARE_PROVIDER_SITE_OTHER): Payer: Medicare Other | Admitting: Ophthalmology

## 2013-07-08 DIAGNOSIS — H43819 Vitreous degeneration, unspecified eye: Secondary | ICD-10-CM

## 2013-07-08 DIAGNOSIS — I10 Essential (primary) hypertension: Secondary | ICD-10-CM

## 2013-07-08 DIAGNOSIS — H348392 Tributary (branch) retinal vein occlusion, unspecified eye, stable: Secondary | ICD-10-CM | POA: Diagnosis not present

## 2013-07-08 DIAGNOSIS — E11319 Type 2 diabetes mellitus with unspecified diabetic retinopathy without macular edema: Secondary | ICD-10-CM | POA: Diagnosis not present

## 2013-07-08 DIAGNOSIS — E1139 Type 2 diabetes mellitus with other diabetic ophthalmic complication: Secondary | ICD-10-CM | POA: Diagnosis not present

## 2013-07-08 DIAGNOSIS — H35039 Hypertensive retinopathy, unspecified eye: Secondary | ICD-10-CM

## 2013-07-21 ENCOUNTER — Other Ambulatory Visit: Payer: Self-pay | Admitting: *Deleted

## 2013-07-21 MED ORDER — MAGNESIUM CHLORIDE 64 MG PO TBEC: 1.0000 | DELAYED_RELEASE_TABLET | Freq: Two times a day (BID) | ORAL | Status: DC

## 2013-07-22 ENCOUNTER — Ambulatory Visit (INDEPENDENT_AMBULATORY_CARE_PROVIDER_SITE_OTHER): Payer: Medicare Other | Admitting: General Practice

## 2013-07-22 DIAGNOSIS — Z5181 Encounter for therapeutic drug level monitoring: Secondary | ICD-10-CM | POA: Diagnosis not present

## 2013-07-22 DIAGNOSIS — Z7901 Long term (current) use of anticoagulants: Secondary | ICD-10-CM

## 2013-07-22 DIAGNOSIS — I2699 Other pulmonary embolism without acute cor pulmonale: Secondary | ICD-10-CM

## 2013-07-22 NOTE — Progress Notes (Signed)
Pre-visit discussion using our clinic review tool. No additional management support is needed unless otherwise documented below in the visit note.  

## 2013-07-29 ENCOUNTER — Other Ambulatory Visit: Payer: Self-pay | Admitting: Endocrinology

## 2013-08-11 ENCOUNTER — Other Ambulatory Visit: Payer: Medicare Other

## 2013-08-11 ENCOUNTER — Other Ambulatory Visit (INDEPENDENT_AMBULATORY_CARE_PROVIDER_SITE_OTHER): Payer: Medicare Other

## 2013-08-11 DIAGNOSIS — R7309 Other abnormal glucose: Secondary | ICD-10-CM | POA: Diagnosis not present

## 2013-08-11 DIAGNOSIS — E039 Hypothyroidism, unspecified: Secondary | ICD-10-CM | POA: Diagnosis not present

## 2013-08-11 DIAGNOSIS — R7303 Prediabetes: Secondary | ICD-10-CM

## 2013-08-11 LAB — URINALYSIS, ROUTINE W REFLEX MICROSCOPIC
BILIRUBIN URINE: NEGATIVE
NITRITE: NEGATIVE
Specific Gravity, Urine: 1.015 (ref 1.000–1.030)
Total Protein, Urine: NEGATIVE
UROBILINOGEN UA: 0.2 (ref 0.0–1.0)
Urine Glucose: NEGATIVE
pH: 7 (ref 5.0–8.0)

## 2013-08-11 LAB — HEMOGLOBIN A1C: Hgb A1c MFr Bld: 6.6 % — ABNORMAL HIGH (ref 4.6–6.5)

## 2013-08-11 LAB — COMPREHENSIVE METABOLIC PANEL
ALT: 36 U/L — ABNORMAL HIGH (ref 0–35)
AST: 31 U/L (ref 0–37)
Albumin: 3.7 g/dL (ref 3.5–5.2)
Alkaline Phosphatase: 77 U/L (ref 39–117)
BUN: 14 mg/dL (ref 6–23)
CALCIUM: 9.1 mg/dL (ref 8.4–10.5)
CHLORIDE: 104 meq/L (ref 96–112)
CO2: 32 meq/L (ref 19–32)
Creatinine, Ser: 0.9 mg/dL (ref 0.4–1.2)
GFR: 77.65 mL/min (ref >60.00)
Glucose, Bld: 85 mg/dL (ref 70–99)
Potassium: 3.8 mEq/L (ref 3.5–5.1)
Sodium: 141 mEq/L (ref 135–145)
Total Bilirubin: 0.5 mg/dL (ref 0.3–1.2)
Total Protein: 6.8 g/dL (ref 6.0–8.3)

## 2013-08-11 LAB — MICROALBUMIN / CREATININE URINE RATIO
CREATININE, U: 175.7 mg/dL
MICROALB/CREAT RATIO: 0.2 mg/g (ref 0.0–30.0)
Microalb, Ur: 0.3 mg/dL (ref 0.0–1.9)

## 2013-08-11 LAB — TSH: TSH: 0.85 u[IU]/mL (ref 0.35–5.50)

## 2013-08-11 LAB — T4, FREE: Free T4: 0.86 ng/dL (ref 0.60–1.60)

## 2013-08-15 ENCOUNTER — Encounter: Payer: Self-pay | Admitting: Endocrinology

## 2013-08-15 ENCOUNTER — Ambulatory Visit (INDEPENDENT_AMBULATORY_CARE_PROVIDER_SITE_OTHER): Payer: Medicare Other | Admitting: Endocrinology

## 2013-08-15 VITALS — BP 124/82 | HR 87 | Temp 98.2°F | Resp 14 | Ht 70.0 in | Wt 229.7 lb

## 2013-08-15 DIAGNOSIS — K911 Postgastric surgery syndromes: Secondary | ICD-10-CM | POA: Diagnosis not present

## 2013-08-15 DIAGNOSIS — R609 Edema, unspecified: Secondary | ICD-10-CM

## 2013-08-15 DIAGNOSIS — R252 Cramp and spasm: Secondary | ICD-10-CM | POA: Diagnosis not present

## 2013-08-15 DIAGNOSIS — E162 Hypoglycemia, unspecified: Secondary | ICD-10-CM | POA: Diagnosis not present

## 2013-08-15 DIAGNOSIS — R600 Localized edema: Secondary | ICD-10-CM

## 2013-08-15 DIAGNOSIS — E039 Hypothyroidism, unspecified: Secondary | ICD-10-CM

## 2013-08-15 DIAGNOSIS — E161 Other hypoglycemia: Secondary | ICD-10-CM

## 2013-08-15 MED ORDER — SPIRONOLACTONE 50 MG PO TABS: 50.0000 mg | ORAL_TABLET | Freq: Every day | ORAL | Status: DC

## 2013-08-15 NOTE — Progress Notes (Signed)
Patient ID: Kelli Martinez, female   DOB: Mar 16, 1946, 68 y.o.   MRN: [id number]  Chief complaint: Low sugars  History of Present Illness:  The patient is again having multiple problems as follows:  1. She has had postprandial hypoglycemia for quite sometime and has been treated with acarbose. She is taking this before each meal, 3 times a day. However she still is having some episodes; these are about once a week now which is less frequently and also  She thinks they are not as severe. this week has had a more severe episode with glucose 41. Most episodes are after lunch and the last one was about 1-1/2 hours after eating. She is now stating that she is taking the acarbose after her meals rather than before.  Episodes started after her gastric bypass surgery especially after she had lost a considerable amount of weight. She has been to the dietitian and has been instructed on balanced low-fat meals with enough protein consistently and restricting carbohydrates including fruits. She thinks she is following the diet. She did try verapamil empirically but this did not help. She is not doing any exercise but is walking more at work  [facility] Readings from Last 3 Encounters:  08/15/13 229 lb 11.2 oz (104.191 kg)  06/06/13 231 lb 9.6 oz (105.053 kg)  05/14/13 233 lb 4.8 oz (105.824 kg)   2.  She has been followed by the Coumadin clinic and INR was last 2.3  3. History of diabetes: She did not bring her monitor and she thinks the only time her glucose as high as if she is treating a low blood sugar when glucose may go up to 200. However A1c is relatively high compared to her  glucose average.   Lab Results  Component Value Date   HGBA1C 6.6* 08/11/2013   HGBA1C 6.6* 05/07/2013   Lab Results  Component Value Date   MICROALBUR 0.3 08/11/2013   LDLCALC 66 05/07/2013   CREATININE 0.9 08/11/2013   4. MUSCLE cramps: She is still having significant muscle cramps in her legs including at night. Not benefiting  from OTC remedies including mustard and tonic water and also magnesium supplements. She thinks taking potassium supplements may help a little and is taking this about every other day. Her potassium level is still low normal at 3.8  5. She is complaining about leg edema despite taking Lasix. Has had history of DVT. The swelling is better in the morning when she wakes up.This is somewhat more the left side. Has tried elastic stockings but these tend to be uncomfortable  6. She is asking about dry mouth. Not on any medications that could do this. Also has dry eyes treated with artificial tears. No history of joint pain      Medication List       This list is accurate as of: 08/15/13  3:41 PM.  Always use your most recent med list.               acarbose 25 MG tablet  Commonly known as:  PRECOSE  Take 1 tablet 3 times a day     albuterol 108 (90 BASE) MCG/ACT inhaler  Commonly known as:  PROVENTIL HFA;VENTOLIN HFA  Inhale 2 puffs into the lungs 4 (four) times daily as needed. As needed rescue inhaler     atorvastatin 20 MG tablet  Commonly known as:  LIPITOR  Take 20 mg by mouth every morning.     ergocalciferol 50000 UNITS capsule  Commonly known as:  VITAMIN D2  Take 50,000 Units by mouth every Friday.     ferrous sulfate 325 (65 FE) MG tablet  Take 325 mg by mouth daily.     fluticasone 50 MCG/ACT nasal spray  Commonly known as:  FLONASE  Place 2 sprays into the nose daily.     Fluticasone-Salmeterol 100-50 MCG/DOSE Aepb  Commonly known as:  ADVAIR  Inhale 1 puff into the lungs every 12 (twelve) hours. Rinse mouth     furosemide 40 MG tablet  Commonly known as:  LASIX  TAKE 1 TABLET EVERY MORNING     gabapentin 300 MG capsule  Commonly known as:  NEURONTIN  Take 300 mg by mouth 4 (four) times daily.     HYDROcodone-acetaminophen 5-500 MG per tablet  Commonly known as:  VICODIN  Take 1 tablet by mouth 2 (two) times daily. Pain     levothyroxine 125 MCG tablet   Commonly known as:  SYNTHROID, LEVOTHROID  Take 1 tablet (125 mcg total) by mouth daily before breakfast.     magnesium chloride 64 MG Tbec SR tablet  Commonly known as:  SLOW-MAG  Take 1 tablet (64 mg total) by mouth 2 (two) times daily.     methocarbamol 500 MG tablet  Commonly known as:  ROBAXIN  Take 1 tablet (500 mg total) by mouth 2 (two) times daily.     methylcellulose 1 % ophthalmic solution  Commonly known as:  ARTIFICIAL TEARS  Place 1 drop into both eyes as needed. Dry eyes     NON FORMULARY  CPAP 6 [facility]     omeprazole 20 MG capsule  Commonly known as:  PRILOSEC  Take 1 capsule (20 mg total) by mouth daily.     potassium chloride SA 20 MEQ tablet  Commonly known as:  K-DUR,KLOR-CON  TAKE 1 TABLET BY MOUTH DAILY     temazepam 15 MG capsule  Commonly known as:  RESTORIL  1-2 for sleep as needed     vitamin B-12 500 MCG tablet  Commonly known as:  CYANOCOBALAMIN  Take 500 mcg by mouth daily.     warfarin 10 MG tablet  Commonly known as:  COUMADIN  Take 10 mg by mouth daily. Pt takes 1 tablet 10 mg on Monday and Wednesday.Pt take 12.5 mg on Sunday, Tuesday, Thursday, Friday, and Saturday. Pt takes 1 whole 10 mg and 0.5 tablet of 5 mg.     warfarin 5 MG tablet  Commonly known as:  COUMADIN  Take 2.5 mg by mouth daily. Pt take 12.5 mg on Sunday, Tuesday, Thursday, Friday, and Saturday. Pt takes 1 whole 10 mg and 0.5 tablet of 5 mg. And 15 mg every Friday        Allergies:  Allergies  Allergen Reactions  . Adhesive [Tape] Rash    Past Medical History  Diagnosis Date  . Allergy   . Asthma   . Pulmonary embolism 1998 / 1994 /1968  . HX: breast cancer   . Blind right eye     hemorrhage  . Hypothyroid   . Clotting disorder   . Hypertension   . Blood transfusion   . Morbid obesity   . Syncope and collapse 2011    due to anemia  . GERD (gastroesophageal reflux disease)   . Hyperlipidemia   . Osteoporosis   . Psoriasis   . Gallstones   .  Anemia   . Elevated liver enzymes   . Diabetes mellitus  hypoglycemia since large wt loss  . OSA (obstructive sleep apnea)     uses c pap  . Arthritis   . Cancer     breast 1994  . Lymphedema of arm     RT  . Back pain, chronic     GETS INJECTIONS IN BACK    Past Surgical History  Procedure Laterality Date  . Abdominal hysterectomy    . Mastectomy      Right  . Gastric bypass  2011    bariatric surgery  . Ivc filter      recurrent DVT  . Bone marrow transplant  1994  . Cardiovascular stress test  05/23/2005    EF 53%  . US echocardiography  10/27/08    EF 55-60%  . Knee arthrotomy  1998  . Carpal tunnell      bil  . Cataracts    . Hiatal hernia repair    . Cholecystectomy  05/10/2012    Procedure: LAPAROSCOPIC CHOLECYSTECTOMY WITH INTRAOPERATIVE CHOLANGIOGRAM;  Surgeon: Pedro Earls, MD;  Location: [facility];  Service: General;  Laterality: N/A;  Laparoscopic Cholecystectomy with Intraoperative Cholangiogram    Family History  Problem Relation Age of Onset  . Sudden death Mother     car accident  . Cancer Father 76    lung  . Cancer Sister 32    lung cancer  . Asthma Daughter     Social History:  reports that she quit smoking about 35 years ago. Her smoking use included Cigarettes. She has a 5 pack-year smoking history. She does not have any smokeless tobacco history on file. She reports that she does not drink alcohol or use illicit drugs.  Review of Systems   Lymphedema right arm, Persistent After radical  breast surgery, using  elastic compression stocking    Has had sharp pains in her left lower leg, somewhat relieved by  Gabapentin, has lumbar spinal stenosis  History of mild hypothyroidism: Adequately replaced  with 62.5 mcg  Lab Results  Component Value Date   TSH 0.85 08/11/2013   Hypercholesterolemia in the past: LDL recently at target  Lab Results  Component Value Date   CHOL 178 05/07/2013   HDL 102.50 05/07/2013   LDLCALC 66 05/07/2013    TRIG 48.0 05/07/2013   CHOLHDL 2 05/07/2013    Previous history of hypertension: Currently not on any medications. Also previously had orthostatic dizziness    LABS:  Appointment on 08/11/2013  Component Date Value Range Status  . Hemoglobin A1C 08/11/2013 6.6* 4.6 - 6.5 % Final   Glycemic Control Guidelines for People with Diabetes:Non Diabetic:  <6%Goal of Therapy: <7%Additional Action Suggested:  >8%   . Microalb, Ur 08/11/2013 0.3  0.0 - 1.9 mg/dL Final  . Creatinine,U 08/11/2013 175.7   Final  . Microalb Creat Ratio 08/11/2013 0.2  0.0 - 30.0 mg/g Final  . Sodium 08/11/2013 141  135 - 145 mEq/L Final  . Potassium 08/11/2013 3.8  3.5 - 5.1 mEq/L Final  . Chloride 08/11/2013 104  96 - 112 mEq/L Final  . CO2 08/11/2013 32  19 - 32 mEq/L Final  . Glucose, Bld 08/11/2013 85  70 - 99 mg/dL Final  . BUN 08/11/2013 14  6 - 23 mg/dL Final  . Creatinine, Ser 08/11/2013 0.9  0.4 - 1.2 mg/dL Final  . Total Bilirubin 08/11/2013 0.5  0.3 - 1.2 mg/dL Final  . Alkaline Phosphatase 08/11/2013 77  39 - 117 U/L Final  . AST 08/11/2013 31  0 - 37 U/L Final  . ALT 08/11/2013 36* 0 - 35 U/L Final  . Total Protein 08/11/2013 6.8  6.0 - 8.3 g/dL Final  . Albumin 08/11/2013 3.7  3.5 - 5.2 g/dL Final  . Calcium 08/11/2013 9.1  8.4 - 10.5 mg/dL Final  . GFR 08/11/2013 77.65  >60.00 mL/min Final  . Free T4 08/11/2013 0.86  0.60 - 1.60 ng/dL Final  . TSH 08/11/2013 0.85  0.35 - 5.50 uIU/mL Final  . Color, Urine 08/11/2013 YELLOW  Yellow;Lt. Yellow Final  . APPearance 08/11/2013 CLEAR  Clear Final  . Specific Gravity, Urine 08/11/2013 1.015  1.000-1.030 Final  . pH 08/11/2013 7.0  5.0 - 8.0 Final  . Total Protein, Urine 08/11/2013 NEGATIVE  Negative Final  . Urine Glucose 08/11/2013 NEGATIVE  Negative Final  . Ketones, ur 08/11/2013 TRACE* Negative Final  . Bilirubin Urine 08/11/2013 NEGATIVE  Negative Final  . Hgb urine dipstick 08/11/2013 SMALL* Negative Final  . Urobilinogen, UA 08/11/2013 0.2   0.0 - 1.0 Final  . Leukocytes, UA 08/11/2013 MODERATE* Negative Final  . Nitrite 08/11/2013 NEGATIVE  Negative Final  . WBC, UA 08/11/2013 7-10/hpf* 0-2/hpf Final  . RBC / HPF 08/11/2013 3-6/hpf* 0-2/hpf Final  . Squamous Epithelial / LPF 08/11/2013 Few(5-10/hpf)* Rare(0-4/hpf) Final  . Bacteria, UA 08/11/2013 Few(10-50/hpf)* None Final  Anti-coag visit on 07/22/2013  Component Date Value Range Status  . INR 07/22/2013 2.3   Final     EXAM:  BP 124/82  Pulse 87  Temp(Src) 98.2 F (36.8 C)  Resp 14  Ht 5\' 10"  (1.778 m)  Wt 229 lb 11.2 oz (104.191 kg)  BMI 32.96 kg/m2  SpO2 99%  Lower legs are relatively swollen and somewhat tender, mild pitting edema  Biceps reflexes normal.  Assessment/Plan:   1. Reactive/postprandial hypoglycemia from dumping syndrome. She has improved with adding  25 mg acarbose with meals. However since she is still having periodic symptoms and does appear to be more after lunch. Had a relatively significant episode this week. Does not want to increase the dose because of some bloating and gaseousness. However discussed with her that she is not taking the medication properly and needs to be taking it right before eating rather than after  For now she will try taking 50 mg at lunch and 9 at suppertime She will continue having balanced meals with protein   2. Venous leg edema not relieved by stockings or Lasix. Also tend to have low normal potassium levels and muscle cramps. Will empirically start her on Aldactone 50 mg daily without any potassium supplements and check electrolytes in 2 weeks May be able to cut back on Lasix. Encouraged her to stay on the last stockings as much as tolerated    3. Hypothyroidism:  TSH is normal.Will  continue same dose  4. Muscle cramps: May benefit from improving potassium levels as above. Also empirically will reduce her Lipitor to every other day using 10 mg  5. Coumadin monitoring: She will try to get her labs done at  our location since this is more convenient  6. She can try OTC salivary substitutes for dry mouth  7. Increased A1c:No need for treatment unless she has demonstrated consistent hyperglycemia  Verl Whitmore 08/15/2013, 3:41 PM

## 2013-08-15 NOTE — Patient Instructions (Addendum)
25mg  acarbose: JUST BEFORE MEALS: 1 BEFORE BFST AND 2 AT LUNCH ONLY No Potasssium and start Spironolactone  Atorvastatin every 2 days Saline nasal spray 4x daily; Try Allegra as needed  Saliva substitute

## 2013-08-22 ENCOUNTER — Other Ambulatory Visit: Payer: Self-pay | Admitting: Endocrinology

## 2013-08-26 ENCOUNTER — Ambulatory Visit (INDEPENDENT_AMBULATORY_CARE_PROVIDER_SITE_OTHER): Payer: Medicare Other | Admitting: General Practice

## 2013-08-26 DIAGNOSIS — I2699 Other pulmonary embolism without acute cor pulmonale: Secondary | ICD-10-CM | POA: Diagnosis not present

## 2013-08-26 DIAGNOSIS — Z5181 Encounter for therapeutic drug level monitoring: Secondary | ICD-10-CM

## 2013-08-26 DIAGNOSIS — Z7901 Long term (current) use of anticoagulants: Secondary | ICD-10-CM

## 2013-08-26 LAB — POCT INR: INR: 2.5

## 2013-08-26 NOTE — Progress Notes (Signed)
Pre-visit discussion using our clinic review tool. No additional management support is needed unless otherwise documented below in the visit note.  

## 2013-08-29 ENCOUNTER — Other Ambulatory Visit: Payer: Medicare Other

## 2013-09-02 ENCOUNTER — Other Ambulatory Visit: Payer: Self-pay | Admitting: *Deleted

## 2013-09-02 ENCOUNTER — Other Ambulatory Visit (INDEPENDENT_AMBULATORY_CARE_PROVIDER_SITE_OTHER): Payer: Medicare Other

## 2013-09-02 DIAGNOSIS — Z5181 Encounter for therapeutic drug level monitoring: Secondary | ICD-10-CM

## 2013-09-02 DIAGNOSIS — I2699 Other pulmonary embolism without acute cor pulmonale: Secondary | ICD-10-CM

## 2013-09-02 DIAGNOSIS — R252 Cramp and spasm: Secondary | ICD-10-CM

## 2013-09-02 DIAGNOSIS — Z7901 Long term (current) use of anticoagulants: Secondary | ICD-10-CM | POA: Diagnosis not present

## 2013-09-02 LAB — BASIC METABOLIC PANEL
BUN: 9 mg/dL (ref 6–23)
CALCIUM: 9.3 mg/dL (ref 8.4–10.5)
CO2: 27 mEq/L (ref 19–32)
Chloride: 103 mEq/L (ref 96–112)
Creatinine, Ser: 0.9 mg/dL (ref 0.4–1.2)
GFR: 82.75 mL/min (ref >60.00)
Glucose, Bld: 140 mg/dL — ABNORMAL HIGH (ref 70–99)
Potassium: 3.7 mEq/L (ref 3.5–5.1)
SODIUM: 136 meq/L (ref 135–145)

## 2013-09-02 LAB — PROTIME-INR
INR: 2.7 ratio — ABNORMAL HIGH (ref 0.8–1.0)
PROTHROMBIN TIME: 27.7 s — AB (ref 10.2–12.4)

## 2013-09-05 ENCOUNTER — Telehealth: Payer: Self-pay | Admitting: *Deleted

## 2013-09-05 NOTE — Telephone Encounter (Signed)
Patient is requesting a refill of Robaxin, okay to send?

## 2013-09-08 ENCOUNTER — Other Ambulatory Visit: Payer: Self-pay | Admitting: *Deleted

## 2013-09-08 MED ORDER — METHOCARBAMOL 500 MG PO TABS: 500.0000 mg | ORAL_TABLET | Freq: Two times a day (BID) | ORAL | Status: DC

## 2013-09-08 NOTE — Telephone Encounter (Signed)
rx sent, patient is aware 

## 2013-09-08 NOTE — Telephone Encounter (Signed)
OK 

## 2013-09-21 ENCOUNTER — Other Ambulatory Visit: Payer: Self-pay | Admitting: Endocrinology

## 2013-10-06 ENCOUNTER — Other Ambulatory Visit: Payer: Self-pay | Admitting: Endocrinology

## 2013-10-08 ENCOUNTER — Ambulatory Visit (INDEPENDENT_AMBULATORY_CARE_PROVIDER_SITE_OTHER): Payer: Medicare Other | Admitting: General Practice

## 2013-10-08 DIAGNOSIS — I2699 Other pulmonary embolism without acute cor pulmonale: Secondary | ICD-10-CM | POA: Diagnosis not present

## 2013-10-08 DIAGNOSIS — Z5181 Encounter for therapeutic drug level monitoring: Secondary | ICD-10-CM

## 2013-10-08 LAB — POCT INR: INR: 4.2

## 2013-10-08 NOTE — Progress Notes (Signed)
Pre visit review using our clinic review tool, if applicable. No additional management support is needed unless otherwise documented below in the visit note. 

## 2013-10-13 ENCOUNTER — Other Ambulatory Visit: Payer: Medicare Other

## 2013-10-14 ENCOUNTER — Other Ambulatory Visit (INDEPENDENT_AMBULATORY_CARE_PROVIDER_SITE_OTHER): Payer: Medicare Other

## 2013-10-14 ENCOUNTER — Other Ambulatory Visit: Payer: Medicare Other

## 2013-10-14 DIAGNOSIS — E162 Hypoglycemia, unspecified: Secondary | ICD-10-CM

## 2013-10-14 DIAGNOSIS — E161 Other hypoglycemia: Secondary | ICD-10-CM

## 2013-10-14 LAB — COMPREHENSIVE METABOLIC PANEL
ALK PHOS: 82 U/L (ref 39–117)
ALT: 34 U/L (ref 0–35)
AST: 31 U/L (ref 0–37)
Albumin: 3.9 g/dL (ref 3.5–5.2)
BUN: 13 mg/dL (ref 6–23)
CO2: 32 mEq/L (ref 19–32)
Calcium: 9.6 mg/dL (ref 8.4–10.5)
Chloride: 101 mEq/L (ref 96–112)
Creatinine, Ser: 0.9 mg/dL (ref 0.4–1.2)
GFR: 79.58 mL/min (ref >60.00)
Glucose, Bld: 105 mg/dL — ABNORMAL HIGH (ref 70–99)
Potassium: 4.3 mEq/L (ref 3.5–5.1)
Sodium: 136 mEq/L (ref 135–145)
Total Bilirubin: 0.4 mg/dL (ref 0.3–1.2)
Total Protein: 7.2 g/dL (ref 6.0–8.3)

## 2013-10-16 ENCOUNTER — Encounter: Payer: Self-pay | Admitting: *Deleted

## 2013-10-16 ENCOUNTER — Ambulatory Visit: Payer: Medicare Other | Admitting: Endocrinology

## 2013-10-16 DIAGNOSIS — Z0289 Encounter for other administrative examinations: Secondary | ICD-10-CM

## 2013-10-24 ENCOUNTER — Ambulatory Visit (INDEPENDENT_AMBULATORY_CARE_PROVIDER_SITE_OTHER): Payer: Medicare Other | Admitting: Endocrinology

## 2013-10-24 ENCOUNTER — Encounter: Payer: Self-pay | Admitting: Endocrinology

## 2013-10-24 VITALS — BP 124/78 | HR 80 | Temp 98.3°F | Resp 16 | Ht 70.0 in | Wt 224.8 lb

## 2013-10-24 DIAGNOSIS — E162 Hypoglycemia, unspecified: Secondary | ICD-10-CM

## 2013-10-24 DIAGNOSIS — R600 Localized edema: Secondary | ICD-10-CM

## 2013-10-24 DIAGNOSIS — R609 Edema, unspecified: Secondary | ICD-10-CM

## 2013-10-24 DIAGNOSIS — R252 Cramp and spasm: Secondary | ICD-10-CM | POA: Diagnosis not present

## 2013-10-24 DIAGNOSIS — E161 Other hypoglycemia: Secondary | ICD-10-CM

## 2013-10-24 DIAGNOSIS — E039 Hypothyroidism, unspecified: Secondary | ICD-10-CM | POA: Diagnosis not present

## 2013-10-24 DIAGNOSIS — E7800 Pure hypercholesterolemia, unspecified: Secondary | ICD-10-CM

## 2013-10-24 DIAGNOSIS — E119 Type 2 diabetes mellitus without complications: Secondary | ICD-10-CM

## 2013-10-24 NOTE — Progress Notes (Signed)
Patient ID: Kelli Martinez, female   DOB: Oct 12, 1945, 68 y.o.   MRN: [id number]  Chief complaint: Followup of various issues  History of Present Illness:  The patient is seen for the following problems:  She has had postprandial hypoglycemia for a couple of years after her gastric bypass surgery and recently has been treated with acarbose. She is taking this before each meal, 3 times a day. She did try verapamil empirically but this did not help. She having rare episodes; once after lunch consisting of chicken, greens and sweet potato. This episode was associated with glucose 46.  She is now stating that she is taking the acarbose after her meals rather than before.   She has been to the dietitian and has been instructed on balanced low-fat meals with enough protein consistently and restricting carbohydrates including fruits. She thinks she is following the diet.  Wt Readings from Last 3 Encounters:  10/24/13 224 lb 12.8 oz (101.969 kg)  08/15/13 229 lb 11.2 oz (104.191 kg)  06/06/13 231 lb 9.6 oz (105.053 kg)   2.  She has been followed by the Coumadin clinic    3. History of diabetes: The only time her glucose as high as if she is treating a low blood sugar and has only one high reading of 159 and the other readings are ranging from 74-87. Again A1c is relatively high compared to her  glucose average. She is doing more exercise with walking     Lab Results  Component Value Date   HGBA1C 6.6* 08/11/2013   HGBA1C 6.6* 05/07/2013   Lab Results  Component Value Date   MICROALBUR 0.3 08/11/2013   LDLCALC 66 05/07/2013   CREATININE 0.9 10/14/2013   4. MUSCLE cramps: She is still having significant muscle cramps in her legs or trunk including at night. Not benefiting from OTC remedies including mustard and tonic water and also magnesium supplements. Because of relatively low potassium and need for diuretics she was started on Aldactone on the last visit. She is still taking Lasix half tablet  twice a day to control edema  Her potassium level is normal now. Robaxin does help and take this as needed.  5. She is having some leg edema, treated with Aldactone and still taking Lasix. Has had history of DVT. The swelling is better in the morning when she wakes up.This is somewhat more the left side. Has tried elastic stockings but these tend to be uncomfortable. Albumin level is normal      Medication List       This list is accurate as of: 10/24/13  1:44 PM.  Always use your most recent med list.               acarbose 25 MG tablet  Commonly known as:  PRECOSE  TAKE 1 TABLET THREE TIMES A DAY     albuterol 108 (90 BASE) MCG/ACT inhaler  Commonly known as:  PROVENTIL HFA;VENTOLIN HFA  Inhale 2 puffs into the lungs 4 (four) times daily as needed. As needed rescue inhaler     atorvastatin 20 MG tablet  Commonly known as:  LIPITOR  Take 20 mg by mouth every morning.     ergocalciferol 50000 UNITS capsule  Commonly known as:  VITAMIN D2  Take 50,000 Units by mouth every Friday.     ferrous sulfate 325 (65 FE) MG tablet  Take 325 mg by mouth daily.     fluticasone 50 MCG/ACT nasal spray  Commonly known as:  FLONASE  Place 2 sprays into the nose daily.     Fluticasone-Salmeterol 100-50 MCG/DOSE Aepb  Commonly known as:  ADVAIR  Inhale 1 puff into the lungs every 12 (twelve) hours. Rinse mouth     furosemide 40 MG tablet  Commonly known as:  LASIX  TAKE 1 TABLET EVERY MORNING     gabapentin 300 MG capsule  Commonly known as:  NEURONTIN  TAKE 1 CAPSULE BY MOUTH FOUR TIMES A DAY     HYDROcodone-acetaminophen 5-500 MG per tablet  Commonly known as:  VICODIN  Take 1 tablet by mouth 2 (two) times daily. Pain     levothyroxine 125 MCG tablet  Commonly known as:  SYNTHROID, LEVOTHROID  Take 1 tablet (125 mcg total) by mouth daily before breakfast.     magnesium chloride 64 MG Tbec SR tablet  Commonly known as:  SLOW-MAG  Take 1 tablet (64 mg total) by mouth 2 (two)  times daily.     methocarbamol 500 MG tablet  Commonly known as:  ROBAXIN  Take 1 tablet (500 mg total) by mouth 2 (two) times daily.     methylcellulose 1 % ophthalmic solution  Commonly known as:  ARTIFICIAL TEARS  Place 1 drop into both eyes as needed. Dry eyes     NON FORMULARY  CPAP 6 [facility]     omeprazole 20 MG capsule  Commonly known as:  PRILOSEC  TAKE 1 CAPSULE DAILY     spironolactone 50 MG tablet  Commonly known as:  ALDACTONE  Take 1 tablet (50 mg total) by mouth daily.     temazepam 15 MG capsule  Commonly known as:  RESTORIL  1-2 for sleep as needed     vitamin B-12 500 MCG tablet  Commonly known as:  CYANOCOBALAMIN  Take 500 mcg by mouth daily.     warfarin 10 MG tablet  Commonly known as:  COUMADIN  Take 10 mg by mouth daily. Pt takes 1 tablet 10 mg on Monday and Wednesday.Pt take 12.5 mg on Sunday, Tuesday, Thursday, Friday, and Saturday. Pt takes 1 whole 10 mg and 0.5 tablet of 5 mg.     warfarin 5 MG tablet  Commonly known as:  COUMADIN  Take 2.5 mg by mouth daily. Pt take 12.5 mg on Sunday, Tuesday, Thursday, Friday, and Saturday. Pt takes 1 whole 10 mg and 0.5 tablet of 5 mg. And 15 mg every Friday        Allergies:  Allergies  Allergen Reactions  . Adhesive [Tape] Rash    Past Medical History  Diagnosis Date  . Allergy   . Asthma   . Pulmonary embolism 1998 / 1994 /1968  . HX: breast cancer   . Blind right eye     hemorrhage  . Hypothyroid   . Clotting disorder   . Hypertension   . Blood transfusion   . Morbid obesity   . Syncope and collapse 2011    due to anemia  . GERD (gastroesophageal reflux disease)   . Hyperlipidemia   . Osteoporosis   . Psoriasis   . Gallstones   . Anemia   . Elevated liver enzymes   . Diabetes mellitus     hypoglycemia since large wt loss  . OSA (obstructive sleep apnea)     uses c pap  . Arthritis   . Cancer     breast 1994  . Lymphedema of arm     RT  . Back pain, chronic  GETS  INJECTIONS IN BACK    Past Surgical History  Procedure Laterality Date  . Abdominal hysterectomy    . Mastectomy      Right  . Gastric bypass  2011    bariatric surgery  . Ivc filter      recurrent DVT  . Bone marrow transplant  1994  . Cardiovascular stress test  05/23/2005    EF 53%  . US echocardiography  10/27/08    EF 55-60%  . Knee arthrotomy  1998  . Carpal tunnell      bil  . Cataracts    . Hiatal hernia repair    . Cholecystectomy  05/10/2012    Procedure: LAPAROSCOPIC CHOLECYSTECTOMY WITH INTRAOPERATIVE CHOLANGIOGRAM;  Surgeon: Pedro Earls, MD;  Location: [facility];  Service: General;  Laterality: N/A;  Laparoscopic Cholecystectomy with Intraoperative Cholangiogram    Family History  Problem Relation Age of Onset  . Sudden death Mother     car accident  . Cancer Father 60    lung  . Cancer Sister 64    lung cancer  . Asthma Daughter     Social History:  reports that she quit smoking about 35 years ago. Her smoking use included Cigarettes. She has a 5 pack-year smoking history. She does not have any smokeless tobacco history on file. She reports that she does not drink alcohol or use illicit drugs.  Review of Systems   Lymphedema right arm, Persistent After radical  breast surgery, using  elastic compression stocking    Has had sharp pains in her left lower leg, somewhat relieved by  Gabapentin, has lumbar spinal stenosis  History of mild hypothyroidism: Adequately replaced  with 62.5 mcg  Lab Results  Component Value Date   TSH 0.85 08/11/2013   Hypercholesterolemia in the past: LDL has been at target  Lab Results  Component Value Date   CHOL 178 05/07/2013   HDL 102.50 05/07/2013   LDLCALC 66 05/07/2013   TRIG 48.0 05/07/2013   CHOLHDL 2 05/07/2013   Previous history of hypertension: Currently not on any medications except diuretics. Also previously had orthostatic dizziness    LABS:  Appointment on 10/14/2013  Component Date Value Ref Range  Status  . Sodium 10/14/2013 136  135 - 145 mEq/L Final  . Potassium 10/14/2013 4.3  3.5 - 5.1 mEq/L Final  . Chloride 10/14/2013 101  96 - 112 mEq/L Final  . CO2 10/14/2013 32  19 - 32 mEq/L Final  . Glucose, Bld 10/14/2013 105* 70 - 99 mg/dL Final  . BUN 10/14/2013 13  6 - 23 mg/dL Final  . Creatinine, Ser 10/14/2013 0.9  0.4 - 1.2 mg/dL Final  . Total Bilirubin 10/14/2013 0.4  0.3 - 1.2 mg/dL Final  . Alkaline Phosphatase 10/14/2013 82  39 - 117 U/L Final  . AST 10/14/2013 31  0 - 37 U/L Final  . ALT 10/14/2013 34  0 - 35 U/L Final  . Total Protein 10/14/2013 7.2  6.0 - 8.3 g/dL Final  . Albumin 10/14/2013 3.9  3.5 - 5.2 g/dL Final  . Calcium 10/14/2013 9.6  8.4 - 10.5 mg/dL Final  . GFR 10/14/2013 79.58  >60.00 mL/min Final     EXAM:  BP 124/78  Pulse 80  Temp(Src) 98.3 F (36.8 C)  Resp 16  Ht 5\' 10"  (1.778 m)  Wt 224 lb 12.8 oz (101.969 kg)  BMI 32.26 kg/m2  SpO2 97%  Left leg appears to be relatively edematous, not much  pitting   Assessment/Plan:   1. Reactive/postprandial hypoglycemia from dumping syndrome. She has improved with adding  25 mg acarbose with meals and has had only one episode. Has done better with compliance with taking this right before eating. She will continue eating balanced small meals with protein consistently   2. Venous leg edema treated with Aldactone and Lasix. She will try to stop her evening Lasix    3. Hypothyroidism:  TSH is normal.Will  continue same dose  4. Muscle cramps: Idiopathic. As above will try to reduce Lasix especially evening dose to avoid nocturnal cramps. She can continue to use magnesium supplements and other OTC remedies  5. Anemia: Continue followup with hematologist   [facility] 10/24/2013, 1:44 PM

## 2013-10-24 NOTE — Patient Instructions (Signed)
Leave off PM dose of Lasix

## 2013-10-28 ENCOUNTER — Other Ambulatory Visit: Payer: Self-pay | Admitting: Endocrinology

## 2013-10-29 ENCOUNTER — Telehealth: Payer: Self-pay | Admitting: Oncology

## 2013-10-29 ENCOUNTER — Encounter: Payer: Self-pay | Admitting: Oncology

## 2013-10-29 ENCOUNTER — Ambulatory Visit (HOSPITAL_BASED_OUTPATIENT_CLINIC_OR_DEPARTMENT_OTHER): Payer: Medicare Other | Admitting: Oncology

## 2013-10-29 ENCOUNTER — Other Ambulatory Visit (HOSPITAL_BASED_OUTPATIENT_CLINIC_OR_DEPARTMENT_OTHER): Payer: Medicare Other

## 2013-10-29 VITALS — BP 116/54 | HR 65 | Temp 98.9°F | Resp 20 | Ht 70.0 in | Wt 227.5 lb

## 2013-10-29 DIAGNOSIS — D509 Iron deficiency anemia, unspecified: Secondary | ICD-10-CM | POA: Diagnosis not present

## 2013-10-29 DIAGNOSIS — I82409 Acute embolism and thrombosis of unspecified deep veins of unspecified lower extremity: Secondary | ICD-10-CM

## 2013-10-29 DIAGNOSIS — Z9884 Bariatric surgery status: Secondary | ICD-10-CM | POA: Diagnosis not present

## 2013-10-29 DIAGNOSIS — Z853 Personal history of malignant neoplasm of breast: Secondary | ICD-10-CM

## 2013-10-29 DIAGNOSIS — D539 Nutritional anemia, unspecified: Secondary | ICD-10-CM

## 2013-10-29 DIAGNOSIS — D649 Anemia, unspecified: Secondary | ICD-10-CM | POA: Diagnosis not present

## 2013-10-29 LAB — IRON AND TIBC CHCC
%SAT: 27 % (ref 21–57)
Iron: 94 ug/dL (ref 41–142)
TIBC: 353 ug/dL (ref 236–444)
UIBC: 259 ug/dL (ref 120–384)

## 2013-10-29 LAB — COMPREHENSIVE METABOLIC PANEL (CC13)
ALK PHOS: 89 U/L (ref 40–150)
ALT: 37 U/L (ref 0–55)
AST: 33 U/L (ref 5–34)
Albumin: 3.7 g/dL (ref 3.5–5.0)
Anion Gap: 9 mEq/L (ref 3–11)
BILIRUBIN TOTAL: 0.35 mg/dL (ref 0.20–1.20)
BUN: 15.2 mg/dL (ref 7.0–26.0)
CO2: 25 mEq/L (ref 22–29)
CREATININE: 0.8 mg/dL (ref 0.6–1.1)
Calcium: 9.4 mg/dL (ref 8.4–10.4)
Chloride: 105 mEq/L (ref 98–109)
GLUCOSE: 104 mg/dL (ref 70–140)
Potassium: 4.4 mEq/L (ref 3.5–5.1)
Sodium: 139 mEq/L (ref 136–145)
Total Protein: 7.1 g/dL (ref 6.4–8.3)

## 2013-10-29 LAB — CBC WITH DIFFERENTIAL/PLATELET
BASO%: 1.5 % (ref 0.0–2.0)
BASOS ABS: 0.1 10*3/uL (ref 0.0–0.1)
EOS ABS: 0.1 10*3/uL (ref 0.0–0.5)
EOS%: 3.5 % (ref 0.0–7.0)
HCT: 38.5 % (ref 34.8–46.6)
HGB: 12.3 g/dL (ref 11.6–15.9)
LYMPH%: 33.2 % (ref 14.0–49.7)
MCH: 28.4 pg (ref 25.1–34.0)
MCHC: 32 g/dL (ref 31.5–36.0)
MCV: 88.6 fL (ref 79.5–101.0)
MONO#: 0.3 10*3/uL (ref 0.1–0.9)
MONO%: 7.3 % (ref 0.0–14.0)
NEUT%: 54.5 % (ref 38.4–76.8)
NEUTROS ABS: 2.2 10*3/uL (ref 1.5–6.5)
PLATELETS: 186 10*3/uL (ref 145–400)
RBC: 4.35 10*6/uL (ref 3.70–5.45)
RDW: 14.1 % (ref 11.2–14.5)
WBC: 4.1 10*3/uL (ref 3.9–10.3)
lymph#: 1.3 10*3/uL (ref 0.9–3.3)

## 2013-10-29 LAB — FERRITIN CHCC: FERRITIN: 775 ng/mL — AB (ref 9–269)

## 2013-10-29 NOTE — Telephone Encounter (Signed)
gv pt appt schedule for sept °

## 2013-10-29 NOTE — Progress Notes (Signed)
Hematology and Oncology Follow Up Visit  Kelli Martinez [id number] 10/04/1945 68 y.o. 10/29/2013 10:05 AM  CC: Elayne Snare, M.D.  [name], MD    Principle Diagnosis:   This is a 68 year old female with the following issues: 1. Multifactorial anemia.  She has an element of iron deficiency, as well as anemia of renal disease, currently on p.o. iron replacement, and has not really required any IV iron recently. 2. History of breast cancer diagnosed in 1993.  She is status post mastectomy followed by adjuvant radiation therapy.  She had also received high-dose chemotherapy and stem-cell transplantation in 1994.  She continues to be in remission from that standpoint.   3. History of gastric bypass surgery done in April 2011.   Interim History: Ms. Luster presents today for a followup visit. She has reported no new symptoms since the last time I saw her. She is reporting slight fatigue but overall her energy is reasonable. She does not report any chest pain; does not report any difficulty breathing.  For the most part her performance status and activity level remain reasonable.  Her weight is stable at this time after she has lost over 100 lbs as her surgery but have been relatively unchanged and recent months.. She is having episodes of low blood sugars at time. No bleeding noted. She does report some occasional right shoulder discomfort with activity.   Medications: I have reviewed the patient's current medications.  Current Outpatient Prescriptions  Medication Sig Dispense Refill  . acarbose (PRECOSE) 25 MG tablet TAKE 1 TABLET THREE TIMES A DAY  270 tablet  1  . albuterol (PROVENTIL HFA;VENTOLIN HFA) 108 (90 BASE) MCG/ACT inhaler Inhale 2 puffs into the lungs 4 (four) times daily as needed. As needed rescue inhaler  3 Inhaler  3  . atorvastatin (LIPITOR) 20 MG tablet Take 10 mg by mouth every other day.       . ergocalciferol (VITAMIN D2) 50000 UNITS capsule Take 50,000 Units by  mouth every Friday.       . ferrous sulfate 325 (65 FE) MG tablet Take 325 mg by mouth daily.        . fluticasone (FLONASE) 50 MCG/ACT nasal spray Place 2 sprays into the nose daily.  48 g  3  . Fluticasone-Salmeterol (ADVAIR) 100-50 MCG/DOSE AEPB Inhale 1 puff into the lungs every 12 (twelve) hours. Rinse mouth  180 each  3  . furosemide (LASIX) 40 MG tablet TAKE 1 TABLET EVERY OTHER MORNING      . gabapentin (NEURONTIN) 300 MG capsule TAKE 1 CAPSULE BY MOUTH FOUR TIMES A DAY  360 capsule  1  . HYDROcodone-acetaminophen (VICODIN) 5-500 MG per tablet Take 1 tablet by mouth 2 (two) times daily. Pain  180 tablet  0  . levothyroxine (SYNTHROID, LEVOTHROID) 125 MCG tablet Take 1 tablet (125 mcg total) by mouth daily before breakfast.  90 tablet  3  . magnesium chloride (SLOW-MAG) 64 MG TBEC SR tablet Take 1 tablet (64 mg total) by mouth 2 (two) times daily.  60 tablet  6  . methocarbamol (ROBAXIN) 500 MG tablet Take 1 tablet (500 mg total) by mouth 2 (two) times daily.  60 tablet  3  . methylcellulose (ARTIFICIAL TEARS) 1 % ophthalmic solution Place 1 drop into both eyes as needed. Dry eyes      . NON FORMULARY CPAP 6 [facility]      . omeprazole (PRILOSEC) 20 MG capsule TAKE 1 CAPSULE DAILY  90 capsule  0  . spironolactone (ALDACTONE) 50 MG tablet Take 1 tablet (50 mg total) by mouth daily.  30 tablet  3  . temazepam (RESTORIL) 15 MG capsule 1-2 for sleep as needed  30 capsule  1  . vitamin B-12 (CYANOCOBALAMIN) 500 MCG tablet Take 500 mcg by mouth daily.        Marland Kitchen warfarin (COUMADIN) 10 MG tablet TAKE 1 TABLET ONCE A DAY  90 tablet  2  . warfarin (COUMADIN) 5 MG tablet Take 2.5 mg by mouth daily. Pt take 12.5 mg on Sunday, Tuesday, Thursday, Friday, and Saturday. Pt takes 1 whole 10 mg and 0.5 tablet of 5 mg. And 15 mg every Friday       No current facility-administered medications for this visit.     Allergies:  Allergies  Allergen Reactions  . Adhesive [Tape] Rash    Past Medical History,  Surgical history, Social history, and Family History were reviewed and updated.  Review of Systems:  Remaining ROS negative. Physical Exam: Blood pressure 116/54, pulse 65, temperature 98.9 F (37.2 C), temperature source Oral, resp. rate 20, height 5\' 10"  (1.778 m), weight 227 lb 8 oz (103.193 kg). ECOG: 1 General appearance: alert awake appeared in no active distress Head: Normocephalic, without obvious abnormality, atraumatic Neck: no adenopathy, no carotid bruit, no JVD, supple, symmetrical, trachea midline and thyroid not enlarged, symmetric, no tenderness/mass/nodules Lymph nodes: Cervical, supraclavicular, and axillary nodes normal. Heart:regular rate and rhythm, S1, S2 normal, no murmur, click, rub or gallop Chest wall examination: No masses or adenopathy. Scar tissue noted in the right axilla. Lung:chest clear, no wheezing, rales, normal symmetric air entry Abdomin: soft, non-tender, without masses or organomegaly EXT:no erythema, induration, or nodules. Her right lower extremity edema slightly less than the left.   Lab Results: Lab Results  Component Value Date   WBC 4.1 10/29/2013   HGB 12.3 10/29/2013   HCT 38.5 10/29/2013   MCV 88.6 10/29/2013   PLT 186 10/29/2013     Chemistry      Component Value Date/Time   NA 136 10/14/2013 1005   NA 141 04/30/2013 0930   K 4.3 10/14/2013 1005   K 4.0 04/30/2013 0930   CL 101 10/14/2013 1005   CL 102 10/29/2012 1014   CO2 32 10/14/2013 1005   CO2 28 04/30/2013 0930   BUN 13 10/14/2013 1005   BUN 12.3 04/30/2013 0930   CREATININE 0.9 10/14/2013 1005   CREATININE 0.8 04/30/2013 0930      Component Value Date/Time   CALCIUM 9.6 10/14/2013 1005   CALCIUM 9.6 04/30/2013 0930   ALKPHOS 82 10/14/2013 1005   ALKPHOS 105 04/30/2013 0930   AST 31 10/14/2013 1005   AST 35* 04/30/2013 0930   ALT 34 10/14/2013 1005   ALT 42 04/30/2013 0930   BILITOT 0.4 10/14/2013 1005   BILITOT 0.43 04/30/2013 0930       Impression and Plan: This is a 68 year old  female with the following issues: 1. Multifactorial anemia.  This is predominately anemia of iron deficiency.  She is on iron replacement and continues to do quite well.  Her hemoglobin is excellent.  I am rechecking her iron stores.  Given her gastric bypass surgery she is at risk of having nutritional deficiencies and it is paramount to continue to monitor those at this time. She does not need any IV iron at this time. 2. History of breast cancer.  She has no evidence of any recurrence at this time.  She  is up to speed on her mammogram and physical examination.   3. History of deep vein thrombosis, currently anticoagulated with Coumadin.  Followup will be  In 6 months.    [facility], MD 4/1/201510:05 AM

## 2013-11-02 ENCOUNTER — Other Ambulatory Visit: Payer: Self-pay | Admitting: Endocrinology

## 2013-11-05 ENCOUNTER — Ambulatory Visit (INDEPENDENT_AMBULATORY_CARE_PROVIDER_SITE_OTHER): Payer: Medicare Other | Admitting: General Practice

## 2013-11-05 DIAGNOSIS — Z5181 Encounter for therapeutic drug level monitoring: Secondary | ICD-10-CM | POA: Diagnosis not present

## 2013-11-05 DIAGNOSIS — I2699 Other pulmonary embolism without acute cor pulmonale: Secondary | ICD-10-CM

## 2013-11-05 LAB — POCT INR: INR: 4.2

## 2013-11-05 NOTE — Progress Notes (Signed)
Pre visit review using our clinic review tool, if applicable. No additional management support is needed unless otherwise documented below in the visit note. 

## 2013-11-13 ENCOUNTER — Other Ambulatory Visit: Payer: Self-pay | Admitting: Endocrinology

## 2013-11-19 ENCOUNTER — Ambulatory Visit (INDEPENDENT_AMBULATORY_CARE_PROVIDER_SITE_OTHER): Payer: Medicare Other | Admitting: General Practice

## 2013-11-19 DIAGNOSIS — I2699 Other pulmonary embolism without acute cor pulmonale: Secondary | ICD-10-CM | POA: Diagnosis not present

## 2013-11-19 DIAGNOSIS — Z7901 Long term (current) use of anticoagulants: Secondary | ICD-10-CM | POA: Diagnosis not present

## 2013-11-19 DIAGNOSIS — Z5181 Encounter for therapeutic drug level monitoring: Secondary | ICD-10-CM

## 2013-11-19 LAB — POCT INR: INR: 2

## 2013-11-19 NOTE — Progress Notes (Signed)
Pre visit review using our clinic review tool, if applicable. No additional management support is needed unless otherwise documented below in the visit note. 

## 2013-11-21 ENCOUNTER — Telehealth: Payer: Self-pay | Admitting: Internal Medicine

## 2013-11-21 NOTE — Telephone Encounter (Signed)
[id number] x1 

## 2013-11-21 NOTE — Telephone Encounter (Signed)
Called and spoke with pt and she stated that her symptoms started yesterday.  Started with :  Sore throat/scratchy throat Head congestion Sneezing with clear drainage Just feels bad Not sure if this is all allergies. But is requesting recs from CY.  Please advise. Thanks  Last ov--04/21/2013 Next ov--04/22/2014  Allergies  Allergen Reactions  . Adhesive [Tape] Rash     Current Outpatient Prescriptions on File Prior to Visit  Medication Sig Dispense Refill  . acarbose (PRECOSE) 25 MG tablet TAKE 1 TABLET THREE TIMES A DAY  270 tablet  1  . albuterol (PROVENTIL HFA;VENTOLIN HFA) 108 (90 BASE) MCG/ACT inhaler Inhale 2 puffs into the lungs 4 (four) times daily as needed. As needed rescue inhaler  3 Inhaler  3  . atorvastatin (LIPITOR) 20 MG tablet TAKE 1 TABLET BY MOUTH ONCE A DAY  90 tablet  2  . ergocalciferol (VITAMIN D2) 50000 UNITS capsule Take 50,000 Units by mouth every Friday.       . ferrous sulfate 325 (65 FE) MG tablet Take 325 mg by mouth daily.        . fluticasone (FLONASE) 50 MCG/ACT nasal spray Place 2 sprays into the nose daily.  48 g  3  . Fluticasone-Salmeterol (ADVAIR) 100-50 MCG/DOSE AEPB Inhale 1 puff into the lungs every 12 (twelve) hours. Rinse mouth  180 each  3  . furosemide (LASIX) 40 MG tablet TAKE 1 TABLET EVERY OTHER MORNING      . gabapentin (NEURONTIN) 300 MG capsule TAKE 1 CAPSULE BY MOUTH FOUR TIMES A DAY  360 capsule  1  . HYDROcodone-acetaminophen (VICODIN) 5-500 MG per tablet Take 1 tablet by mouth 2 (two) times daily. Pain  180 tablet  0  . KLOR-CON M20 20 MEQ tablet TAKE 1 TABLET DAILY  90 tablet  1  . levothyroxine (SYNTHROID, LEVOTHROID) 125 MCG tablet Take 1 tablet (125 mcg total) by mouth daily before breakfast.  90 tablet  3  . magnesium chloride (SLOW-MAG) 64 MG TBEC SR tablet Take 1 tablet (64 mg total) by mouth 2 (two) times daily.  60 tablet  6  . methocarbamol (ROBAXIN) 500 MG tablet Take 1 tablet (500 mg total) by mouth 2 (two) times daily.   60 tablet  3  . methylcellulose (ARTIFICIAL TEARS) 1 % ophthalmic solution Place 1 drop into both eyes as needed. Dry eyes      . NON FORMULARY CPAP 6 [facility]      . omeprazole (PRILOSEC) 20 MG capsule TAKE 1 CAPSULE DAILY  90 capsule  1  . spironolactone (ALDACTONE) 50 MG tablet Take 1 tablet (50 mg total) by mouth daily.  30 tablet  3  . temazepam (RESTORIL) 15 MG capsule 1-2 for sleep as needed  30 capsule  1  . vitamin B-12 (CYANOCOBALAMIN) 500 MCG tablet Take 500 mcg by mouth daily.        Marland Kitchen warfarin (COUMADIN) 10 MG tablet TAKE 1 TABLET ONCE A DAY  90 tablet  2  . warfarin (COUMADIN) 5 MG tablet Take 2.5 mg by mouth daily. Pt take 12.5 mg on Sunday, Tuesday, Thursday, Friday, and Saturday. Pt takes 1 whole 10 mg and 0.5 tablet of 5 mg. And 15 mg every Friday       No current facility-administered medications on file prior to visit.

## 2013-11-21 NOTE — Telephone Encounter (Signed)
Try Allegra/ fexofenadine  antihistamine. If this is not enough, try adding otc Flonase nasal spray

## 2013-11-24 NOTE — Telephone Encounter (Signed)
[email or web address]. Jennifer Castillo, CMA  

## 2013-11-24 NOTE — Telephone Encounter (Signed)
Called, spoke with pt.  Informed him of below recs per CY.  She verbalized understanding of this.  Pt states she is already taking flonase daily.  She will try to add the allegra.  If this doesn't help, she will call office back.  She voiced no further questions or concerns at this time.

## 2013-11-24 NOTE — Telephone Encounter (Signed)
Pt is down stair w/son calling bk a/b a perscript for an antibotic she checked w/ pharm and nothing had been called in waiting to hear from from nurse pls call her on cell# [phone].Hillery Hunter

## 2013-12-04 ENCOUNTER — Ambulatory Visit (INDEPENDENT_AMBULATORY_CARE_PROVIDER_SITE_OTHER): Payer: Medicare Other | Admitting: Surgery

## 2013-12-04 ENCOUNTER — Encounter (INDEPENDENT_AMBULATORY_CARE_PROVIDER_SITE_OTHER): Payer: Self-pay | Admitting: Surgery

## 2013-12-04 VITALS — BP 136/84 | HR 76 | Temp 98.0°F | Resp 18 | Ht 69.0 in | Wt 218.0 lb

## 2013-12-04 DIAGNOSIS — Z9884 Bariatric surgery status: Secondary | ICD-10-CM | POA: Diagnosis not present

## 2013-12-04 NOTE — Patient Instructions (Signed)
Thanks for your patience.  If you need further assistance after leaving the office, please call our office and speak with a [facility] nurse.  [phone].  If you want to leave a message for Dr. Kacy Hegna, please call his office phone at [phone]. 

## 2013-12-04 NOTE — Progress Notes (Signed)
Kelli Martinez 68 y.o.  Body mass index is 32.18 kg/(m^2).  Patient Active Problem List   Diagnosis Date Noted  . Encounter for therapeutic drug monitoring 08/26/2013  . Insomnia 05/01/2013  . Long term (current) use of anticoagulants 04/15/2013  . Postsurgical dumping syndrome 04/06/2013  . Bilateral leg edema 04/06/2013  . Reactive hypoglycemia 04/02/2013  . Abdominal pain intermittent-evalulating for internal hernia 11/07/2012  . S/P laparoscopic cholecystectomy 05/31/2012  . Roux Y Gastric Bypass April 2011; repair [facility] 11/22/2011  . Unspecified deficiency anemia 11/14/2011  . Left leg paresthesias 10/27/2010  . Hypothyroid 10/27/2010  . Low back pain 10/27/2010  . Obstructive sleep apnea 09/29/2007  . PULMONARY EMBOLISM 09/25/2007  . Seasonal and perennial allergic rhinitis 09/25/2007  . Allergic-infective asthma 09/25/2007  . BREAST CANCER, HX OF 09/25/2007    Allergies  Allergen Reactions  . Adhesive [Tape] Rash    Past Surgical History  Procedure Laterality Date  . Abdominal hysterectomy    . Mastectomy      Right  . Gastric bypass  2011    bariatric surgery  . Ivc filter      recurrent DVT  . Bone marrow transplant  1994  . Cardiovascular stress test  05/23/2005    EF 53%  . US echocardiography  10/27/08    EF 55-60%  . Knee arthrotomy  1998  . Carpal tunnell      bil  . Cataracts    . Hiatal hernia repair    . Cholecystectomy  05/10/2012    Procedure: LAPAROSCOPIC CHOLECYSTECTOMY WITH INTRAOPERATIVE CHOLANGIOGRAM;  Surgeon: Pedro Earls, MD;  Location: [facility];  Service: General;  Laterality: N/A;  Laparoscopic Cholecystectomy with Intraoperative Norval Morton, MD 1. Gastric bypass status for obesity     Doing very well.  Not having any abdominal complaints. She is determined to get below 200 pounds. I examined her axilla where she had some radiation there is some keloid-like scar in the lateral aspect of her axilla. His chronic and I  would not disturb it. She is now 22 years out from her mastectomy and subsequent bone marrow transplant. Posterior back in 6 months. Also scheduled to see her son for possible bariatric surgery. Matt B. Hassell Done, MD, [facility] [phone] beeper [phone]  12/04/2013 12:22 PM

## 2013-12-17 ENCOUNTER — Ambulatory Visit (INDEPENDENT_AMBULATORY_CARE_PROVIDER_SITE_OTHER): Payer: Medicare Other | Admitting: General Practice

## 2013-12-17 DIAGNOSIS — Z5181 Encounter for therapeutic drug level monitoring: Secondary | ICD-10-CM

## 2013-12-17 DIAGNOSIS — Z7901 Long term (current) use of anticoagulants: Secondary | ICD-10-CM

## 2013-12-17 DIAGNOSIS — I2699 Other pulmonary embolism without acute cor pulmonale: Secondary | ICD-10-CM | POA: Diagnosis not present

## 2013-12-17 LAB — POCT INR: INR: 2.2

## 2013-12-17 NOTE — Progress Notes (Signed)
Pre visit review using our clinic review tool, if applicable. No additional management support is needed unless otherwise documented below in the visit note. 

## 2013-12-19 ENCOUNTER — Other Ambulatory Visit: Payer: Self-pay | Admitting: *Deleted

## 2013-12-19 MED ORDER — SPIRONOLACTONE 50 MG PO TABS: 50.0000 mg | ORAL_TABLET | Freq: Every day | ORAL | Status: DC

## 2013-12-20 ENCOUNTER — Other Ambulatory Visit: Payer: Self-pay | Admitting: Endocrinology

## 2014-01-09 ENCOUNTER — Other Ambulatory Visit: Payer: Self-pay | Admitting: *Deleted

## 2014-01-09 MED ORDER — WARFARIN SODIUM 5 MG PO TABS: ORAL_TABLET | ORAL | Status: DC

## 2014-01-13 ENCOUNTER — Ambulatory Visit (INDEPENDENT_AMBULATORY_CARE_PROVIDER_SITE_OTHER): Payer: Medicare Other | Admitting: General Practice

## 2014-01-13 DIAGNOSIS — Z5181 Encounter for therapeutic drug level monitoring: Secondary | ICD-10-CM

## 2014-01-13 DIAGNOSIS — Z7901 Long term (current) use of anticoagulants: Secondary | ICD-10-CM

## 2014-01-13 DIAGNOSIS — I2699 Other pulmonary embolism without acute cor pulmonale: Secondary | ICD-10-CM

## 2014-01-13 LAB — POCT INR: INR: 3.2

## 2014-01-13 NOTE — Progress Notes (Signed)
Pre visit review using our clinic review tool, if applicable. No additional management support is needed unless otherwise documented below in the visit note. 

## 2014-01-23 ENCOUNTER — Other Ambulatory Visit (INDEPENDENT_AMBULATORY_CARE_PROVIDER_SITE_OTHER): Payer: Medicare Other

## 2014-01-23 DIAGNOSIS — E7800 Pure hypercholesterolemia, unspecified: Secondary | ICD-10-CM | POA: Diagnosis not present

## 2014-01-23 DIAGNOSIS — E119 Type 2 diabetes mellitus without complications: Secondary | ICD-10-CM

## 2014-01-23 DIAGNOSIS — R609 Edema, unspecified: Secondary | ICD-10-CM | POA: Diagnosis not present

## 2014-01-23 DIAGNOSIS — R600 Localized edema: Secondary | ICD-10-CM

## 2014-01-23 LAB — BASIC METABOLIC PANEL
BUN: 14 mg/dL (ref 6–23)
CHLORIDE: 103 meq/L (ref 96–112)
CO2: 29 mEq/L (ref 19–32)
Calcium: 9.1 mg/dL (ref 8.4–10.5)
Creatinine, Ser: 1 mg/dL (ref 0.4–1.2)
GFR: 74.75 mL/min (ref >60.00)
Glucose, Bld: 101 mg/dL — ABNORMAL HIGH (ref 70–99)
POTASSIUM: 3.8 meq/L (ref 3.5–5.1)
SODIUM: 139 meq/L (ref 135–145)

## 2014-01-23 LAB — LIPID PANEL
CHOLESTEROL: 172 mg/dL (ref 0–200)
HDL: 100 mg/dL (ref >39.00)
LDL Cholesterol: 66 mg/dL (ref 0–99)
NonHDL: 72
TRIGLYCERIDES: 28 mg/dL (ref 0.0–149.0)
Total CHOL/HDL Ratio: 2
VLDL: 5.6 mg/dL (ref 0.0–40.0)

## 2014-01-23 LAB — HEMOGLOBIN A1C: Hgb A1c MFr Bld: 6.5 % (ref 4.6–6.5)

## 2014-01-27 ENCOUNTER — Encounter: Payer: Self-pay | Admitting: Endocrinology

## 2014-01-27 ENCOUNTER — Ambulatory Visit (INDEPENDENT_AMBULATORY_CARE_PROVIDER_SITE_OTHER): Payer: Medicare Other | Admitting: Endocrinology

## 2014-01-27 VITALS — BP 118/72 | HR 64 | Temp 98.1°F | Resp 16 | Ht 70.0 in | Wt 219.4 lb

## 2014-01-27 DIAGNOSIS — M542 Cervicalgia: Secondary | ICD-10-CM

## 2014-01-27 DIAGNOSIS — E162 Hypoglycemia, unspecified: Secondary | ICD-10-CM

## 2014-01-27 DIAGNOSIS — R600 Localized edema: Secondary | ICD-10-CM

## 2014-01-27 DIAGNOSIS — R609 Edema, unspecified: Secondary | ICD-10-CM

## 2014-01-27 DIAGNOSIS — E039 Hypothyroidism, unspecified: Secondary | ICD-10-CM | POA: Diagnosis not present

## 2014-01-27 DIAGNOSIS — L309 Dermatitis, unspecified: Secondary | ICD-10-CM

## 2014-01-27 DIAGNOSIS — L259 Unspecified contact dermatitis, unspecified cause: Secondary | ICD-10-CM

## 2014-01-27 DIAGNOSIS — E161 Other hypoglycemia: Secondary | ICD-10-CM

## 2014-01-27 MED ORDER — TRIAMCINOLONE ACETONIDE 0.1 % EX CREA: 1.0000 "application " | TOPICAL_CREAM | Freq: Two times a day (BID) | CUTANEOUS | Status: DC

## 2014-01-27 NOTE — Patient Instructions (Addendum)
Increase Spinolatone to 100mg    Acarbose 50 mg at lunch  More sugars after lunch and supper

## 2014-01-27 NOTE — Progress Notes (Signed)
Patient ID: Kelli Martinez, female   DOB: 04-23-1946, 68 y.o.   MRN: [id number]    Chief complaint: Followup of various issues  History of Present Illness:   The patient is seen for the following problems:  1. Postprandial hypoglycemia related to dumping syndrome She has had postprandial hypoglycemia for a couple of years after her gastric bypass surgery and recently has been treated with acarbose. She is taking this before each meal, 3 times a day. She did try verapamil empirically but this did not help.  She having rare episodes; she thinks these are mostly after lunch, typically 2 hours after eating but not consistently; once had a subjective blood sugar drop she was starting to eat. More recently she has not been able to document these readings especially if she is not at home hypoglycemia is despite continuing Precose and she was also instructed to take it before eating. No side effects with this She has been to the dietitian and has been instructed on balanced low-fat meals with enough protein consistently and restricting carbohydrates including fruits.  She thinks she is following the diet especially getting protein, occasionally has very little carbohydrates.   Wt Readings from Last 3 Encounters:  01/27/14 219 lb 6.4 oz (99.519 kg)  12/04/13 218 lb (98.884 kg)  10/29/13 227 lb 8 oz (103.193 kg)   2.  DVT prophylaxis: She has been followed by the Coumadin clinic    3. History of diabetes:  She still continues to have high normal A1c readings in the low most of her home readings are normal Currently on Precose low dose 3 times a day She has only 2 high readings of around 150 late morning possibly after treating a low blood sugar and the other readings are ranging from 67-133. Again A1c is relatively high compared to her  glucose average. She is doing some exercise with exercise bike and had lost weight compared to 3 months ago   Lab Results  Component Value Date   HGBA1C 6.5  01/23/2014   HGBA1C 6.6* 08/11/2013   HGBA1C 6.6* 05/07/2013   Lab Results  Component Value Date   MICROALBUR 0.3 08/11/2013   LDLCALC 66 01/23/2014   CREATININE 1.0 01/23/2014   4. MUSCLE cramps: She is getting some relief with Robaxin and has not had any muscle cramps with using Aldactone    Her potassium level is normal now at 3.8  5. She is still having some leg edema, treated with Aldactone and now taking Lasix every other day. Has had history of DVT. The swelling is better in the morning when she wakes up.This is again more the left side. Has tried elastic stockings but these tend to be uncomfortable. Albumin level is normal  6. She is asking about right posterior neck pain at times, some better with Robaxin  7. She is getting a rash on the back of her right calf which is reddish. She tried an OTC eczema cream which helped a little       Medication List       This list is accurate as of: 01/27/14  8:48 AM.  Always use your most recent med list.               acarbose 25 MG tablet  Commonly known as:  PRECOSE  TAKE 1 TABLET THREE TIMES A DAY     albuterol 108 (90 BASE) MCG/ACT inhaler  Commonly known as:  PROVENTIL HFA;VENTOLIN HFA  Inhale 2 puffs into the  lungs 4 (four) times daily as needed. As needed rescue inhaler     atorvastatin 20 MG tablet  Commonly known as:  LIPITOR  TAKE 1 TABLET BY MOUTH ONCE A DAY     ergocalciferol 50000 UNITS capsule  Commonly known as:  VITAMIN D2  Take 50,000 Units by mouth every Friday.     ferrous sulfate 325 (65 FE) MG tablet  Take 325 mg by mouth daily.     fluticasone 50 MCG/ACT nasal spray  Commonly known as:  FLONASE  Place 2 sprays into the nose daily.     Fluticasone-Salmeterol 100-50 MCG/DOSE Aepb  Commonly known as:  ADVAIR  Inhale 1 puff into the lungs every 12 (twelve) hours. Rinse mouth     furosemide 40 MG tablet  Commonly known as:  LASIX  TAKE 1 TABLET EVERY MORNING     furosemide 40 MG tablet  Commonly  known as:  LASIX  TAKE 1 TABLET EVERY OTHER MORNING     gabapentin 300 MG capsule  Commonly known as:  NEURONTIN  TAKE 1 CAPSULE BY MOUTH FOUR TIMES A DAY     HYDROcodone-acetaminophen 5-500 MG per tablet  Commonly known as:  VICODIN  Take 1 tablet by mouth 2 (two) times daily. Pain     KLOR-CON M20 20 MEQ tablet  Generic drug:  potassium chloride SA  TAKE 1 TABLET DAILY     levothyroxine 125 MCG tablet  Commonly known as:  SYNTHROID, LEVOTHROID  Take 1 tablet (125 mcg total) by mouth daily before breakfast.     magnesium chloride 64 MG Tbec SR tablet  Commonly known as:  SLOW-MAG  Take 1 tablet (64 mg total) by mouth 2 (two) times daily.     methocarbamol 500 MG tablet  Commonly known as:  ROBAXIN  Take 1 tablet (500 mg total) by mouth 2 (two) times daily.     methylcellulose 1 % ophthalmic solution  Commonly known as:  ARTIFICIAL TEARS  Place 1 drop into both eyes as needed. Dry eyes     NON FORMULARY  CPAP 6 [facility]     omeprazole 20 MG capsule  Commonly known as:  PRILOSEC  TAKE 1 CAPSULE DAILY     spironolactone 50 MG tablet  Commonly known as:  ALDACTONE  Take 1 tablet (50 mg total) by mouth daily.     temazepam 15 MG capsule  Commonly known as:  RESTORIL  1-2 for sleep as needed     vitamin B-12 500 MCG tablet  Commonly known as:  CYANOCOBALAMIN  Take 500 mcg by mouth daily.     warfarin 5 MG tablet  Commonly known as:  COUMADIN  Take 2.5 mg by mouth daily. Pt take 12.5 mg on Sunday, Tuesday, Thursday, Friday, and Saturday. Pt takes 1 whole 10 mg and 0.5 tablet of 5 mg. And 15 mg every Friday     warfarin 10 MG tablet  Commonly known as:  COUMADIN  TAKE 1 TABLET ONCE A DAY     warfarin 5 MG tablet  Commonly known as:  COUMADIN  Take 2 tablets once a day total 10 mg        Allergies:  Allergies  Allergen Reactions  . Adhesive [Tape] Rash    Past Medical History  Diagnosis Date  . Allergy   . Asthma   . Pulmonary embolism 1998 / 1994  /1968  . HX: breast cancer   . Blind right eye     hemorrhage  .  Hypothyroid   . Clotting disorder   . Hypertension   . Blood transfusion   . Morbid obesity   . Syncope and collapse 2011    due to anemia  . GERD (gastroesophageal reflux disease)   . Hyperlipidemia   . Osteoporosis   . Psoriasis   . Gallstones   . Anemia   . Elevated liver enzymes   . Diabetes mellitus     hypoglycemia since large wt loss  . OSA (obstructive sleep apnea)     uses c pap  . Arthritis   . Cancer     breast 1994  . Lymphedema of arm     RT  . Back pain, chronic     GETS INJECTIONS IN BACK    Past Surgical History  Procedure Laterality Date  . Abdominal hysterectomy    . Mastectomy      Right  . Gastric bypass  2011    bariatric surgery  . Ivc filter      recurrent DVT  . Bone marrow transplant  1994  . Cardiovascular stress test  05/23/2005    EF 53%  . US echocardiography  10/27/08    EF 55-60%  . Knee arthrotomy  1998  . Carpal tunnell      bil  . Cataracts    . Hiatal hernia repair    . Cholecystectomy  05/10/2012    Procedure: LAPAROSCOPIC CHOLECYSTECTOMY WITH INTRAOPERATIVE CHOLANGIOGRAM;  Surgeon: Pedro Earls, MD;  Location: [facility];  Service: General;  Laterality: N/A;  Laparoscopic Cholecystectomy with Intraoperative Cholangiogram    Family History  Problem Relation Age of Onset  . Sudden death Mother     car accident  . Cancer Father 38    lung  . Cancer Sister 46    lung cancer  . Asthma Daughter     Social History:  reports that she quit smoking about 35 years ago. Her smoking use included Cigarettes. She has a 5 pack-year smoking history. She does not have any smokeless tobacco history on file. She reports that she does not drink alcohol or use illicit drugs.  Review of Systems   Lymphedema right arm, Persistent After radical  breast surgery, using  elastic compression stocking    Has had sharp pains in her left lower leg, somewhat relieved by   Gabapentin, has lumbar spinal stenosis  History of mild hypothyroidism: Adequately replaced  with 62.5 mcg  Lab Results  Component Value Date   TSH 0.85 08/11/2013   Hypercholesterolemia: LDL has been at target with Lipitor and she is compliant with this. Taking this also for cardiovascular prophylaxis  Lab Results  Component Value Date   CHOL 172 01/23/2014   HDL 100.00 01/23/2014   LDLCALC 66 01/23/2014   TRIG 28.0 01/23/2014   CHOLHDL 2 01/23/2014   Previous history of hypertension: Currently not on any medications except diuretics. Also previously had orthostatic dizziness    LABS:  Appointment on 01/23/2014  Component Date Value Ref Range Status  . Sodium 01/23/2014 139  135 - 145 mEq/L Final  . Potassium 01/23/2014 3.8  3.5 - 5.1 mEq/L Final  . Chloride 01/23/2014 103  96 - 112 mEq/L Final  . CO2 01/23/2014 29  19 - 32 mEq/L Final  . Glucose, Bld 01/23/2014 101* 70 - 99 mg/dL Final  . BUN 01/23/2014 14  6 - 23 mg/dL Final  . Creatinine, Ser 01/23/2014 1.0  0.4 - 1.2 mg/dL Final  . Calcium 01/23/2014 9.1  8.4 - 10.5 mg/dL Final  . GFR 01/23/2014 74.75  >60.00 mL/min Final  . Hemoglobin A1C 01/23/2014 6.5  4.6 - 6.5 % Final   Glycemic Control Guidelines for People with Diabetes:Non Diabetic:  <6%Goal of Therapy: <7%Additional Action Suggested:  >8%   . Cholesterol 01/23/2014 172  0 - 200 mg/dL Final   ATP III Classification       Desirable:  < 200 mg/dL               Borderline High:  200 - 239 mg/dL          High:  > = 240 mg/dL  . Triglycerides 01/23/2014 28.0  0.0 - 149.0 mg/dL Final   Normal:  <150 mg/dLBorderline High:  150 - 199 mg/dL  . HDL 01/23/2014 100.00  >39.00 mg/dL Final  . VLDL 01/23/2014 5.6  0.0 - 40.0 mg/dL Final  . LDL Cholesterol 01/23/2014 66  0 - 99 mg/dL Final  . Total CHOL/HDL Ratio 01/23/2014 2   Final                  Men          Women1/2 Average Risk     3.4          3.3Average Risk          5.0          4.42X Average Risk          9.6           7.13X Average Risk          15.0          11.0                      . NonHDL 01/23/2014 72.00   Final  Anti-coag visit on 01/13/2014  Component Date Value Ref Range Status  . INR 01/13/2014 3.2   Final     EXAM:  BP 118/72  Pulse 64  Temp(Src) 98.1 F (36.7 C)  Resp 16  Ht 5\' 10"  (1.778 m)  Wt 219 lb 6.4 oz (99.519 kg)  BMI 31.48 kg/m2  SpO2 96%  Left leg appears to be relatively edematous and firmer than the right lower leg, minimal pitting No calf tenderness or erythema of the leg She has a patchy erythematous rash in the back of the right half  Normal touch sensation on the right fingers   Assessment/Plan:   1. Reactive/postprandial hypoglycemia from dumping syndrome. She has improved with adding  25 mg acarbose with meals and has had  cranial episodes, about once or twice a week. Has done better with compliance with taking this right before eating. Since she is having some more episodes after lunch she can try taking 50 mg at lunch  She will continue eating balanced small meals with protein consistently   2. Venous leg edema treated with Aldactone and Lasix. She Is still complaining of edema especially on the left side and is not able to use elastic stockings. Empirically will increase her Aldactone to 100 mg; current potassium 3.8   3. Hypothyroidism:  TSH is normal.Will  continue same dose  4. Muscle cramps: Idiopathic.Recently better  5. rash on leg: We'll try triamcinolone cream  6. Cervical pain is somewhat neuralgic type and she can try increasing her gabapentin  7. History of diabetes: A1c upper normal at 6.5, continue Precose as well as diet and exercise which she is usually compliant with  Total visit time = 30 minutes   KUMAR,AJAY 01/27/2014, 8:48 AM

## 2014-01-28 ENCOUNTER — Other Ambulatory Visit: Payer: Self-pay | Admitting: *Deleted

## 2014-01-28 MED ORDER — TRIAMCINOLONE ACETONIDE 0.1 % EX CREA: 1.0000 "application " | TOPICAL_CREAM | Freq: Two times a day (BID) | CUTANEOUS | Status: DC

## 2014-02-10 ENCOUNTER — Ambulatory Visit (INDEPENDENT_AMBULATORY_CARE_PROVIDER_SITE_OTHER): Payer: Medicare Other | Admitting: Family Medicine

## 2014-02-10 DIAGNOSIS — I2699 Other pulmonary embolism without acute cor pulmonale: Secondary | ICD-10-CM | POA: Diagnosis not present

## 2014-02-10 DIAGNOSIS — Z5181 Encounter for therapeutic drug level monitoring: Secondary | ICD-10-CM | POA: Diagnosis not present

## 2014-02-10 DIAGNOSIS — Z7901 Long term (current) use of anticoagulants: Secondary | ICD-10-CM

## 2014-02-10 LAB — POCT INR: INR: 2.2

## 2014-02-13 ENCOUNTER — Other Ambulatory Visit: Payer: Self-pay | Admitting: Endocrinology

## 2014-02-17 DIAGNOSIS — M779 Enthesopathy, unspecified: Secondary | ICD-10-CM | POA: Diagnosis not present

## 2014-02-17 DIAGNOSIS — IMO0002 Reserved for concepts with insufficient information to code with codable children: Secondary | ICD-10-CM | POA: Diagnosis not present

## 2014-02-17 DIAGNOSIS — M25579 Pain in unspecified ankle and joints of unspecified foot: Secondary | ICD-10-CM | POA: Diagnosis not present

## 2014-02-17 DIAGNOSIS — G5760 Lesion of plantar nerve, unspecified lower limb: Secondary | ICD-10-CM | POA: Diagnosis not present

## 2014-02-17 DIAGNOSIS — M79609 Pain in unspecified limb: Secondary | ICD-10-CM | POA: Diagnosis not present

## 2014-02-24 DIAGNOSIS — M659 Synovitis and tenosynovitis, unspecified: Secondary | ICD-10-CM | POA: Diagnosis not present

## 2014-02-27 ENCOUNTER — Other Ambulatory Visit: Payer: Self-pay | Admitting: Endocrinology

## 2014-03-02 ENCOUNTER — Encounter: Payer: Self-pay | Admitting: Endocrinology

## 2014-03-02 ENCOUNTER — Ambulatory Visit (INDEPENDENT_AMBULATORY_CARE_PROVIDER_SITE_OTHER): Payer: Medicare Other | Admitting: Endocrinology

## 2014-03-02 VITALS — BP 100/64 | HR 60 | Temp 98.0°F | Resp 16 | Ht 70.0 in | Wt 213.6 lb

## 2014-03-02 DIAGNOSIS — E162 Hypoglycemia, unspecified: Secondary | ICD-10-CM

## 2014-03-02 DIAGNOSIS — E161 Other hypoglycemia: Secondary | ICD-10-CM

## 2014-03-02 DIAGNOSIS — E039 Hypothyroidism, unspecified: Secondary | ICD-10-CM

## 2014-03-02 DIAGNOSIS — R609 Edema, unspecified: Secondary | ICD-10-CM

## 2014-03-02 DIAGNOSIS — I951 Orthostatic hypotension: Secondary | ICD-10-CM | POA: Diagnosis not present

## 2014-03-02 DIAGNOSIS — R600 Localized edema: Secondary | ICD-10-CM

## 2014-03-02 DIAGNOSIS — E119 Type 2 diabetes mellitus without complications: Secondary | ICD-10-CM

## 2014-03-02 NOTE — Progress Notes (Addendum)
Patient ID: Kelli Martinez, female   DOB: [birth year], 68 y.o.   MRN: [id number]    Chief complaint: Followup of various issues  History of Present Illness:  The patient is seen for the following problems:  1. Postprandial hypoglycemia related to dumping syndrome  She has had postprandial hypoglycemia for a couple of years after her gastric bypass surgery and recently has been treated with acarbose. She is taking this before each meal, 3 times a day. She did try verapamil empirically but this did not help.   She having occasional episodes since her last visit She was told to increase her Precose at lunchtime to 50 mg but she increase the dose only at breakfast She has one documented a glucose of 58 right after supper and she thinks she was eating less.day overall and may not have had a balanced meal Also after breakfast felt hypoglycemic but glucose was only 71 Otherwise low sugars are typically 2 hours after eating but not consistently She has been to the dietitian and has been instructed on balanced low-fat meals with enough protein consistently and restricting carbohydrates including fruits.  She thinks she is following the diet especially getting protein, occasionally has a very light breakfast with fruit only  Wt Readings from Last 3 Encounters:  03/02/14 213 lb 9.6 oz (96.888 kg)  01/27/14 219 lb 6.4 oz (99.519 kg)  12/04/13 218 lb (98.884 kg)   2.  DVT prophylaxis: She has been followed by the Coumadin clinic    3. History of diabetes:  She still continues to have high normal A1c readings although most of her home readings are normal Currently on Precose low dose 3 times a day She does have a high reading of 165 after a late breakfast once Overall portion size is well controlled   Lab Results  Component Value Date   HGBA1C 6.5 01/23/2014   HGBA1C 6.6* 08/11/2013   HGBA1C 6.6* 05/07/2013   Lab Results  Component Value Date   MICROALBUR 0.3 08/11/2013   LDLCALC 66  01/23/2014   CREATININE 1.0 01/23/2014     4. Leg edema: She was having more edema on her last visit despite taking low dose Aldactone and Lasix This is again more the left side.  With increasing her Aldactone to 100 mg her swelling is significantly better and not as frequent No lightheadedness on standing up and no excessive leg cramps Potassium level pending Has tried elastic stockings but these tend to be uncomfortable. Albumin level previously normal       Medication List       This list is accurate as of: 03/02/14 11:14 AM.  Always use your most recent med list.               acarbose 25 MG tablet  Commonly known as:  PRECOSE  TAKE 1 TABLET THREE TIMES A DAY     albuterol 108 (90 BASE) MCG/ACT inhaler  Commonly known as:  PROVENTIL HFA;VENTOLIN HFA  Inhale 2 puffs into the lungs 4 (four) times daily as needed. As needed rescue inhaler     atorvastatin 20 MG tablet  Commonly known as:  LIPITOR  TAKE 1 TABLET BY MOUTH ONCE A DAY     ergocalciferol 50000 UNITS capsule  Commonly known as:  VITAMIN D2  Take 50,000 Units by mouth every Friday.     ferrous sulfate 325 (65 FE) MG tablet  Take 325 mg by mouth daily.     fluticasone 50 MCG/ACT nasal  spray  Commonly known as:  FLONASE  Place 2 sprays into the nose daily.     Fluticasone-Salmeterol 100-50 MCG/DOSE Aepb  Commonly known as:  ADVAIR  Inhale 1 puff into the lungs every 12 (twelve) hours. Rinse mouth     furosemide 40 MG tablet  Commonly known as:  LASIX  TAKE 1 TABLET EVERY OTHER MORNING     gabapentin 300 MG capsule  Commonly known as:  NEURONTIN  TAKE 1 CAPSULE FOUR TIMES A DAY     HYDROcodone-acetaminophen 5-500 MG per tablet  Commonly known as:  VICODIN  Take 1 tablet by mouth 2 (two) times daily. Pain     levothyroxine 125 MCG tablet  Commonly known as:  SYNTHROID, LEVOTHROID  Take 1 tablet (125 mcg total) by mouth daily before breakfast.     magnesium chloride 64 MG Tbec SR tablet  Commonly  known as:  SLOW-MAG  Take 1 tablet (64 mg total) by mouth 2 (two) times daily.     methocarbamol 500 MG tablet  Commonly known as:  ROBAXIN  Take 1 tablet (500 mg total) by mouth 2 (two) times daily.     methylcellulose 1 % ophthalmic solution  Commonly known as:  ARTIFICIAL TEARS  Place 1 drop into both eyes as needed. Dry eyes     NON FORMULARY  CPAP 6 [employer]     omeprazole 20 MG capsule  Commonly known as:  PRILOSEC  TAKE 1 CAPSULE DAILY     spironolactone 50 MG tablet  Commonly known as:  ALDACTONE  Take 1 tablet (50 mg total) by mouth daily.     temazepam 15 MG capsule  Commonly known as:  RESTORIL  1-2 for sleep as needed     triamcinolone cream 0.1 %  Commonly known as:  KENALOG  Apply 1 application topically 2 (two) times daily.     vitamin B-12 500 MCG tablet  Commonly known as:  CYANOCOBALAMIN  Take 500 mcg by mouth daily.     warfarin 5 MG tablet  Commonly known as:  COUMADIN  Take 2.5 mg by mouth daily. Pt take 12.5 mg on Sunday, Tuesday, Thursday, Friday, and Saturday. Pt takes 1 whole 10 mg and 0.5 tablet of 5 mg. And 15 mg every Friday     warfarin 10 MG tablet  Commonly known as:  COUMADIN  TAKE 1 TABLET ONCE A DAY        Allergies:  Allergies  Allergen Reactions  . Adhesive [Tape] Rash    Past Medical History  Diagnosis Date  . Allergy   . Asthma   . Pulmonary embolism 1998 / 1994 /1968  . HX: breast cancer   . Blind right eye     hemorrhage  . Hypothyroid   . Clotting disorder   . Hypertension   . Blood transfusion   . Morbid obesity   . Syncope and collapse 2011    due to anemia  . GERD (gastroesophageal reflux disease)   . Hyperlipidemia   . Osteoporosis   . Psoriasis   . Gallstones   . Anemia   . Elevated liver enzymes   . Diabetes mellitus     hypoglycemia since large wt loss  . OSA (obstructive sleep apnea)     uses c pap  . Arthritis   . Cancer     breast 1994  . Lymphedema of arm     RT  . Back pain, chronic      GETS INJECTIONS IN  BACK    Past Surgical History  Procedure Laterality Date  . Abdominal hysterectomy    . Mastectomy      Right  . Gastric bypass  2011    bariatric surgery  . Ivc filter      recurrent DVT  . Bone marrow transplant  1994  . Cardiovascular stress test  05/23/2005    EF 53%  . US echocardiography  10/27/08    EF 55-60%  . Knee arthrotomy  1998  . Carpal tunnell      bil  . Cataracts    . Hiatal hernia repair    . Cholecystectomy  05/10/2012    Procedure: LAPAROSCOPIC CHOLECYSTECTOMY WITH INTRAOPERATIVE CHOLANGIOGRAM;  Surgeon: Pedro Earls, MD;  Location: [facility];  Service: General;  Laterality: N/A;  Laparoscopic Cholecystectomy with Intraoperative Cholangiogram    Family History  Problem Relation Age of Onset  . Sudden death Mother     car accident  . Cancer Father 10    lung  . Cancer Sister 109    lung cancer  . Asthma Daughter     Social History:  reports that she quit smoking about 35 years ago. Her smoking use included Cigarettes. She has a 5 pack-year smoking history. She does not have any smokeless tobacco history on file. She reports that she does not drink alcohol or use illicit drugs.  Review of Systems   Lymphedema right arm, Persistent After radical  breast surgery, using  elastic compression stocking    Has had sharp pains in her left lower leg, somewhat relieved by  Gabapentin, has lumbar spinal stenosis  History of mild hypothyroidism: Adequately replaced  with 62.5 mcg  Lab Results  Component Value Date   TSH 0.85 08/11/2013   Hypercholesterolemia: LDL has been at target with Lipitor and she is compliant with this. Taking this also for cardiovascular prophylaxis  Lab Results  Component Value Date   CHOL 172 01/23/2014   HDL 100.00 01/23/2014   LDLCALC 66 01/23/2014   TRIG 28.0 01/23/2014   CHOLHDL 2 01/23/2014   Previous history of hypertension: Currently not on any medications except diuretics. Also previously had  orthostatic dizziness    LABS:  Anti-coag visit on 02/10/2014  Component Date Value Ref Range Status  . INR 02/10/2014 2.2   Final     EXAM:  BP 100/64  Pulse 60  Temp(Src) 98 F (36.7 C)  Resp 16  Ht 5\' 10"  (1.778 m)  Wt 213 lb 9.6 oz (96.888 kg)  BMI 30.65 kg/m2  SpO2 96%  Standing blood pressure 102/60 Both legs are mildly edematous with minimal pitting, not larger on the left   Assessment/Plan:   1. Reactive/postprandial hypoglycemia from dumping syndrome. She has less symptoms recently as long as she is consistent with her diet. Since she usually has a smaller breakfast she can take 50 mg of Precose at lunch instead of breakfast She will continue eating balanced small meals with protein consistently   2. Venous leg edema treated with Aldactone and Lasix. She Is having good relief with 100 mg Aldactone but her blood pressure is low normal. She can try stopping her Lasix, to have potassium checked today   3. Hypothyroidism:  TSH to be checked periodically    Caidyn Blossom 03/02/2014, 11:14 AM

## 2014-03-02 NOTE — Patient Instructions (Addendum)
Change instant oatmeal to cooked  Stop Lasix

## 2014-03-03 ENCOUNTER — Other Ambulatory Visit (INDEPENDENT_AMBULATORY_CARE_PROVIDER_SITE_OTHER): Payer: Medicare Other

## 2014-03-03 DIAGNOSIS — R600 Localized edema: Secondary | ICD-10-CM

## 2014-03-03 DIAGNOSIS — R609 Edema, unspecified: Secondary | ICD-10-CM

## 2014-03-03 LAB — RENAL FUNCTION PANEL
Albumin: 3.8 g/dL (ref 3.5–5.2)
BUN: 12 mg/dL (ref 6–23)
CHLORIDE: 99 meq/L (ref 96–112)
CO2: 28 mEq/L (ref 19–32)
Calcium: 9.5 mg/dL (ref 8.4–10.5)
Creatinine, Ser: 0.8 mg/dL (ref 0.4–1.2)
GFR: 88.39 mL/min (ref >60.00)
GLUCOSE: 98 mg/dL (ref 70–99)
PHOSPHORUS: 3.3 mg/dL (ref 2.3–4.6)
POTASSIUM: 4 meq/L (ref 3.5–5.1)
SODIUM: 137 meq/L (ref 135–145)

## 2014-03-04 NOTE — Progress Notes (Signed)
Quick Note:  Please let patient know that the lab result is normal and no further action needed ______ 

## 2014-03-05 ENCOUNTER — Other Ambulatory Visit: Payer: Self-pay | Admitting: Endocrinology

## 2014-03-11 ENCOUNTER — Ambulatory Visit (INDEPENDENT_AMBULATORY_CARE_PROVIDER_SITE_OTHER): Payer: Medicare Other | Admitting: Family Medicine

## 2014-03-11 DIAGNOSIS — I2699 Other pulmonary embolism without acute cor pulmonale: Secondary | ICD-10-CM | POA: Diagnosis not present

## 2014-03-11 DIAGNOSIS — Z7901 Long term (current) use of anticoagulants: Secondary | ICD-10-CM

## 2014-03-11 DIAGNOSIS — Z5181 Encounter for therapeutic drug level monitoring: Secondary | ICD-10-CM

## 2014-03-11 LAB — POCT INR: INR: 3.4

## 2014-03-17 DIAGNOSIS — E1139 Type 2 diabetes mellitus with other diabetic ophthalmic complication: Secondary | ICD-10-CM | POA: Diagnosis not present

## 2014-03-27 ENCOUNTER — Other Ambulatory Visit: Payer: Self-pay | Admitting: Endocrinology

## 2014-03-30 ENCOUNTER — Ambulatory Visit (INDEPENDENT_AMBULATORY_CARE_PROVIDER_SITE_OTHER): Payer: Medicare Other | Admitting: Endocrinology

## 2014-03-30 ENCOUNTER — Encounter: Payer: Self-pay | Admitting: Endocrinology

## 2014-03-30 VITALS — BP 106/60 | HR 61 | Temp 98.5°F | Resp 16 | Ht 70.0 in | Wt 213.8 lb

## 2014-03-30 DIAGNOSIS — E162 Hypoglycemia, unspecified: Secondary | ICD-10-CM

## 2014-03-30 DIAGNOSIS — E161 Other hypoglycemia: Secondary | ICD-10-CM

## 2014-03-30 DIAGNOSIS — R5383 Other fatigue: Secondary | ICD-10-CM

## 2014-03-30 DIAGNOSIS — R531 Weakness: Secondary | ICD-10-CM

## 2014-03-30 DIAGNOSIS — R5381 Other malaise: Secondary | ICD-10-CM | POA: Diagnosis not present

## 2014-03-30 MED ORDER — HYDROCODONE-ACETAMINOPHEN 5-325 MG PO TABS: 1.0000 | ORAL_TABLET | Freq: Two times a day (BID) | ORAL | Status: DC | PRN

## 2014-03-30 NOTE — Patient Instructions (Addendum)
Acarbose right before eating: 1 at breakfast, 2 at lunch, one at supper  Spironolactone 50 mg two in ams; if still weak at night then reduce to one

## 2014-03-30 NOTE — Progress Notes (Signed)
Patient ID: Kelli Martinez, female   DOB: Apr 04, 1946, 68 y.o.   MRN: [id number]    Chief complaint: Followup of various issues  History of Present Illness:  The patient is seen for the following problems:  1. Postprandial hypoglycemia related to dumping syndrome  58, 77 pcl,  weak hs on standing  She has had postprandial hypoglycemia for a couple of years after her gastric bypass surgery and now has been treated with acarbose. She did try verapamil empirically but this did not help.  She is taking her Precose before each meal, usually 10 minutes before eating. She was told to take 2 tablets at lunch instead of at breakfast but she is still using the old dose Most of her low sugars appear to be after lunch. This is despite usually getting some protein at every meal  She having occasional episodes again since her last visit She has documented one low blood sugar of 59 after lunch but she thinks she felt symptomatic with a glucose of 77 yesterday She is however checking her blood sugars fairly sporadically She thinks that during the night at 2 or 3 AM when she is getting up to go to the bathroom she feels weak and lightheaded but better when she lies down; she still has a snack at bedtime without checking her sugar  She has been to the dietitian and has been instructed on balanced low-fat meals with enough protein consistently and restricting carbohydrates including fruits.  She thinks she is following the diet especially getting protein, occasionally has a very light breakfast with fruit only  Wt Readings from Last 3 Encounters:  03/30/14 213 lb 12.8 oz (96.979 kg)  03/02/14 213 lb 9.6 oz (96.888 kg)  01/27/14 219 lb 6.4 oz (99.519 kg)   2.  DVT prophylaxis: She has been followed by the Coumadin clinic    3. History of diabetes:  She  continues to have high normal A1c readings although most of her home readings are normal and only high reading of 174 was after treating  hypoglycemia Currently on Precose low dose 3 times a day She is fairly active at work and is using exercise bike Overall portion size is well controlled   Lab Results  Component Value Date   HGBA1C 6.5 01/23/2014   HGBA1C 6.6* 08/11/2013   HGBA1C 6.6* 05/07/2013   Lab Results  Component Value Date   MICROALBUR 0.3 08/11/2013   LDLCALC 66 01/23/2014   CREATININE 0.8 03/03/2014     4. Leg edema: She has had better control of her edema with increasing her Aldactone to 100 mg.  Also because of low normal blood pressure her Lasix was stopped She thinks her edema is not any worse but still gets some swelling. This is again more the left side As mentioned above she tends to get weak during the night She was told to take Aldactone 2 tablets in the morning of the 50 mg but is taking 1 twice a day Has tried elastic stockings but these tend to be uncomfortable. Albumin level previously normal      Medication List       This list is accurate as of: 03/30/14 10:33 AM.  Always use your most recent med list.               acarbose 25 MG tablet  Commonly known as:  PRECOSE  TAKE 1 TABLET THREE TIMES A DAY     albuterol 108 (90 BASE) MCG/ACT inhaler  Commonly known as:  PROVENTIL HFA;VENTOLIN HFA  Inhale 2 puffs into the lungs 4 (four) times daily as needed. As needed rescue inhaler     atorvastatin 20 MG tablet  Commonly known as:  LIPITOR  TAKE 1 TABLET BY MOUTH ONCE A DAY     ergocalciferol 50000 UNITS capsule  Commonly known as:  VITAMIN D2  Take 50,000 Units by mouth every Friday.     ferrous sulfate 325 (65 FE) MG tablet  Take 325 mg by mouth daily.     fluticasone 50 MCG/ACT nasal spray  Commonly known as:  FLONASE  Place 2 sprays into the nose daily.     Fluticasone-Salmeterol 100-50 MCG/DOSE Aepb  Commonly known as:  ADVAIR  Inhale 1 puff into the lungs every 12 (twelve) hours. Rinse mouth     furosemide 40 MG tablet  Commonly known as:  LASIX  TAKE 1 TABLET EVERY  OTHER MORNING     furosemide 40 MG tablet  Commonly known as:  LASIX  TAKE 1 TABLET EVERY MORNING     gabapentin 300 MG capsule  Commonly known as:  NEURONTIN  TAKE 1 CAPSULE FOUR TIMES A DAY     HYDROcodone-acetaminophen 5-500 MG per tablet  Commonly known as:  VICODIN  Take 1 tablet by mouth 2 (two) times daily. Pain     KLOR-CON M20 20 MEQ tablet  Generic drug:  potassium chloride SA  TAKE 1 TABLET DAILY     levothyroxine 125 MCG tablet  Commonly known as:  SYNTHROID, LEVOTHROID  Take 1 tablet (125 mcg total) by mouth daily before breakfast.     magnesium chloride 64 MG Tbec SR tablet  Commonly known as:  SLOW-MAG  Take 1 tablet (64 mg total) by mouth 2 (two) times daily.     methocarbamol 500 MG tablet  Commonly known as:  ROBAXIN  Take 1 tablet (500 mg total) by mouth 2 (two) times daily.     methylcellulose 1 % ophthalmic solution  Commonly known as:  ARTIFICIAL TEARS  Place 1 drop into both eyes as needed. Dry eyes     NON FORMULARY  CPAP 6 [facility]     omeprazole 20 MG capsule  Commonly known as:  PRILOSEC  TAKE 1 CAPSULE DAILY     spironolactone 50 MG tablet  Commonly known as:  ALDACTONE  Take 1 tablet (50 mg total) by mouth daily.     temazepam 15 MG capsule  Commonly known as:  RESTORIL  1-2 for sleep as needed     triamcinolone cream 0.1 %  Commonly known as:  KENALOG  Apply 1 application topically 2 (two) times daily.     vitamin B-12 500 MCG tablet  Commonly known as:  CYANOCOBALAMIN  Take 500 mcg by mouth daily.     warfarin 5 MG tablet  Commonly known as:  COUMADIN  Take 2.5 mg by mouth daily. Pt take 12.5 mg on Sunday, Tuesday, Thursday, Friday, and Saturday. Pt takes 1 whole 10 mg and 0.5 tablet of 5 mg. And 15 mg every Friday     warfarin 10 MG tablet  Commonly known as:  COUMADIN  TAKE 1 TABLET ONCE A DAY        Allergies:  Allergies  Allergen Reactions  . Adhesive [Tape] Rash    Past Medical History  Diagnosis Date  .  Allergy   . Asthma   . Pulmonary embolism 1998 / 1994 /1968  . HX: breast cancer   .  Blind right eye     hemorrhage  . Hypothyroid   . Clotting disorder   . Hypertension   . Blood transfusion   . Morbid obesity   . Syncope and collapse 2011    due to anemia  . GERD (gastroesophageal reflux disease)   . Hyperlipidemia   . Osteoporosis   . Psoriasis   . Gallstones   . Anemia   . Elevated liver enzymes   . Diabetes mellitus     hypoglycemia since large wt loss  . OSA (obstructive sleep apnea)     uses c pap  . Arthritis   . Cancer     breast 1994  . Lymphedema of arm     RT  . Back pain, chronic     GETS INJECTIONS IN BACK    Past Surgical History  Procedure Laterality Date  . Abdominal hysterectomy    . Mastectomy      Right  . Gastric bypass  2011    bariatric surgery  . Ivc filter      recurrent DVT  . Bone marrow transplant  1994  . Cardiovascular stress test  05/23/2005    EF 53%  . US echocardiography  10/27/08    EF 55-60%  . Knee arthrotomy  1998  . Carpal tunnell      bil  . Cataracts    . Hiatal hernia repair    . Cholecystectomy  05/10/2012    Procedure: LAPAROSCOPIC CHOLECYSTECTOMY WITH INTRAOPERATIVE CHOLANGIOGRAM;  Surgeon: Pedro Earls, MD;  Location: [facility];  Service: General;  Laterality: N/A;  Laparoscopic Cholecystectomy with Intraoperative Cholangiogram    Family History  Problem Relation Age of Onset  . Sudden death Mother     car accident  . Cancer Father 32    lung  . Cancer Sister 46    lung cancer  . Asthma Daughter     Social History:  reports that she quit smoking about 35 years ago. Her smoking use included Cigarettes. She has a 5 pack-year smoking history. She does not have any smokeless tobacco history on file. She reports that she does not drink alcohol or use illicit drugs.  Review of Systems   Lymphedema right arm, Persistent After radical  breast surgery, using  elastic compression stocking    Has had sharp  pains in her left lower leg, somewhat relieved by  Gabapentin, has lumbar spinal stenosis  History of mild hypothyroidism: Adequately replaced  with 62.5 mcg  Lab Results  Component Value Date   TSH 0.85 08/11/2013   Hypercholesterolemia: LDL has been at target with Lipitor and she is compliant with this. Taking this also for cardiovascular prophylaxis  Lab Results  Component Value Date   CHOL 172 01/23/2014   HDL 100.00 01/23/2014   LDLCALC 66 01/23/2014   TRIG 28.0 01/23/2014   CHOLHDL 2 01/23/2014   Previous history of hypertension: Currently not on any medications except diuretics. Also previously had orthostatic dizziness    LABS:  Anti-coag visit on 03/11/2014  Component Date Value Ref Range Status  . INR 03/11/2014 3.4   Final  Appointment on 03/03/2014  Component Date Value Ref Range Status  . Sodium 03/03/2014 137  135 - 145 mEq/L Final  . Potassium 03/03/2014 4.0  3.5 - 5.1 mEq/L Final  . Chloride 03/03/2014 99  96 - 112 mEq/L Final  . CO2 03/03/2014 28  19 - 32 mEq/L Final  . Calcium 03/03/2014 9.5  8.4 - 10.5 mg/dL  Final  . Albumin 03/03/2014 3.8  3.5 - 5.2 g/dL Final  . BUN 03/03/2014 12  6 - 23 mg/dL Final  . Creatinine, Ser 03/03/2014 0.8  0.4 - 1.2 mg/dL Final  . Glucose, Bld 03/03/2014 98  70 - 99 mg/dL Final  . Phosphorus 03/03/2014 3.3  2.3 - 4.6 mg/dL Final  . GFR 03/03/2014 88.39  >60.00 mL/min Final     EXAM:  BP 106/60  Pulse 61  Temp(Src) 98.5 F (36.9 C)  Resp 16  Ht 5\' 10"  (1.778 m)  Wt 213 lb 12.8 oz (96.979 kg)  BMI 30.68 kg/m2  SpO2 97%  Both legs are mildly swelling with minimal pitting,  larger on the left   Assessment/Plan:   1. Reactive/postprandial hypoglycemia from dumping syndrome. She has periodic symptoms although not as severe and mostly after lunch Usually she is consistent with her diet with balanced meals.  Again repeated her instructions for changing the Precose to 2 tablets at lunch instead of at breakfast She will  try to take the medication right before eating instead of 10 minutes before She will call if she has any worsening of symptoms   2. Venous leg edema treated with Aldactone only. She Is having good control with 100 mg Aldactone but her blood pressure is low normal again. She can try stopping her evening Aldactone especially since she has symptoms of weakness during the night which may be related to orthostasis Potassium has been normal as of this month    3. Joint pains: She is requesting a prescription for hydrocodone for relief of joint and back pain, 60 tablets given    Sabastion Hrdlicka 03/30/2014, 10:33 AM

## 2014-04-04 ENCOUNTER — Other Ambulatory Visit: Payer: Self-pay | Admitting: Endocrinology

## 2014-04-08 ENCOUNTER — Ambulatory Visit (INDEPENDENT_AMBULATORY_CARE_PROVIDER_SITE_OTHER): Payer: Medicare Other | Admitting: *Deleted

## 2014-04-08 DIAGNOSIS — Z5181 Encounter for therapeutic drug level monitoring: Secondary | ICD-10-CM | POA: Diagnosis not present

## 2014-04-08 DIAGNOSIS — I2699 Other pulmonary embolism without acute cor pulmonale: Secondary | ICD-10-CM | POA: Diagnosis not present

## 2014-04-08 DIAGNOSIS — Z7901 Long term (current) use of anticoagulants: Secondary | ICD-10-CM

## 2014-04-08 LAB — POCT INR: INR: 3.1

## 2014-04-21 ENCOUNTER — Encounter: Payer: Self-pay | Admitting: Internal Medicine

## 2014-04-21 ENCOUNTER — Ambulatory Visit: Payer: Medicare Other | Admitting: Internal Medicine

## 2014-04-21 VITALS — BP 110/64 | HR 54 | Ht 69.0 in | Wt 215.0 lb

## 2014-04-21 DIAGNOSIS — Z23 Encounter for immunization: Secondary | ICD-10-CM

## 2014-04-21 MED ORDER — FLUTICASONE PROPIONATE 50 MCG/ACT NA SUSP: 2.0000 | Freq: Every day | NASAL | Status: DC

## 2014-04-21 MED ORDER — FLUTICASONE-SALMETEROL 100-50 MCG/DOSE IN AEPB: 1.0000 | INHALATION_SPRAY | Freq: Two times a day (BID) | RESPIRATORY_TRACT | Status: DC

## 2014-04-21 MED ORDER — ALBUTEROL SULFATE HFA 108 (90 BASE) MCG/ACT IN AERS: 2.0000 | INHALATION_SPRAY | Freq: Four times a day (QID) | RESPIRATORY_TRACT | Status: DC | PRN

## 2014-04-21 MED ORDER — CLONAZEPAM 0.5 MG PO TABS: ORAL_TABLET | ORAL | Status: DC

## 2014-04-21 NOTE — Progress Notes (Signed)
Subjective:    Patient ID: Kelli Martinez, female    DOB: 25-Sep-1945, 68 y.o.   MRN: [id number]  HPI 03/21/11- 68 yoF former smoker followed for OSA, allergic rhinitis,asthma,  hx pulm embolism/DVT/filter/coumadin(Dr Dwyane Dee), hx R breast Ca/ mastectomy/ xrt/chem/bonemarrow transplant at [facility], hx gastric bypass Last here March 22, 2010 Uses CPAP 6 [employer]- most of each night. Averages 6 hrs sleep/ night. Pressure seems right. Wakes in AM with dry mouth. Sleeps alone.  Wakes with dry mouth and stuffy nose.  Continues Advair. Dry cough can lead to asthma. Needs rescue inhaler refilled.- needed it daily while working summer garden.   04/23/12- 66 yoF former smoker followed for OSA, allergic rhinitis,asthma,  hx pulm embolism/DVT/filter/coumadin(Dr Dwyane Dee), hx R breast Ca/ mastectomy/ xrt/chem/bonemarrow transplant at [facility], hx gastric bypass Wakes up in mid of night gasping for air if not wearing CPAP; When she wears CPAP 6/ Advanced about 3-4 hours each night-nostrils and throat get too dry so unable to wear for longer periods of time; pressure seems to work well for patient.  04/21/13- 76 yoF former smoker followed for OSA, allergic rhinitis,asthma,  hx pulm embolism/DVT/filter/coumadin(Dr Dwyane Dee), hx R breast Ca/ mastectomy/ xrt/chem/bonemarrow transplant at [place], hx gastric bypass FOLLOWS FOR:  Not wearing CPAP at all states gets sinuses too dry and throat, also loud and wakes her up Alone with no body to comment on her snoring. Insomnia, helped by gabapentin given for peripheral neuropathy. Always stuffy right nostril with no drainage. Asthma control good with Advair, occasional rescue inhaler.  04/21/14- 68 yoF former smoker followed for OSA, allergic rhinitis,asthma,  hx pulm embolism/DVT/filter/coumadin(Dr Dwyane Dee), hx R breast Ca/ mastectomy/ xrt/chem/bonemarrow transplant at [place], hx gastric bypass FOLLOWS FOR: Pt wearing cpap about 4 hours nightly, 3 nights/week.  Denies any complaints with  mask/pressure/suplpies.  Needs refills on sleep aid.     Review of Systems- see HPI Constitutional:   No-   weight loss, night sweats, fevers, chills, fatigue, lassitude. HEENT:   No-  headaches, difficulty swallowing, tooth/dental problems, sore throat,   +dry mouth      No-  sneezing, itching, ear ache,  +nasal congestion, post nasal drip,  CV:  No-   chest pain, orthopnea, PND, swelling in lower extremities, anasarca, dizziness, palpitations Resp: No-   shortness of breath with exertion or at rest.              No-   productive cough,  No non-productive cough,  No-  coughing up of blood.              No-   change in color of mucus.   Skin: No-   rash or lesions. GI:  No-   heartburn, indigestion, abdominal pain, nausea, vomiting,  GU:  MS:  No-   joint pain,  +  swelling.   Neuro- :  Psych:  No- change in mood or affect. No depression or anxiety.  No memory loss.   Objective:   Physical Exam General- Alert, Oriented, Affect-appropriate, Distress- none acute  obese Skin- rash-none, lesions- none, excoriation- none. XRT skin changes right lateral neck Lymphadenopathy- none Head- atraumatic            Eyes- Gross vision intact, PERRLA, conjunctivae clear secretions            Ears- Hearing, canals normal            Nose- Clear, no- Septal dev, + sticky mucus, polyps, erosion, perforation  Throat- Mallampati II , mucosa clear , drainage- none, tonsils- atrophic, missing teeth Neck- flexible , trachea midline, no stridor , thyroid nl, carotid no bruit Chest - symmetrical excursion , unlabored           Heart/CV- RRR , no murmur , no gallop  , no rub, nl s1 s2                           - JVD- none , edema+L>R, stasis changes- none, varices- none           Lung- clear to P&A, wheeze- none, cough- none , dullness-none, rub- none           Chest wall-  Abd- tender-no, distended-no, bowel sounds-present, HSM- no Br/ Gen/ Rectal- Not done, not indicated Extrem- cyanosis- none,  clubbing, none, atrophy- none, strength- nl   Heavy legs. +Lymphedema right forearm/ elastic sleeve Neuro- grossly intact to observation  Assessment & Plan:

## 2014-04-21 NOTE — Patient Instructions (Addendum)
Our goal with CPAP is to try to wear it all night every night. We can continue the current pressure.  Scripts to refill your regular meds through [facility]  Instead of refilling temazepam for sleep right now, let's try clonazepam- script printed  Flu vax  Ask Dr Dwyane Dee what you should do about the pneumonia vaccine- if last dose is in his old records

## 2014-04-22 ENCOUNTER — Ambulatory Visit: Payer: Medicare Other | Admitting: Internal Medicine

## 2014-04-26 ENCOUNTER — Other Ambulatory Visit: Payer: Self-pay | Admitting: Endocrinology

## 2014-04-28 ENCOUNTER — Other Ambulatory Visit: Payer: Self-pay | Admitting: Endocrinology

## 2014-04-28 ENCOUNTER — Other Ambulatory Visit: Payer: Medicare Other

## 2014-04-28 ENCOUNTER — Other Ambulatory Visit: Payer: Self-pay | Admitting: *Deleted

## 2014-04-28 DIAGNOSIS — R609 Edema, unspecified: Secondary | ICD-10-CM | POA: Diagnosis not present

## 2014-04-28 DIAGNOSIS — E119 Type 2 diabetes mellitus without complications: Secondary | ICD-10-CM | POA: Diagnosis not present

## 2014-04-28 DIAGNOSIS — E039 Hypothyroidism, unspecified: Secondary | ICD-10-CM | POA: Diagnosis not present

## 2014-04-28 LAB — TSH: TSH: 1.582 u[IU]/mL (ref 0.350–4.500)

## 2014-04-28 LAB — COMPREHENSIVE METABOLIC PANEL
ALT: 33 U/L (ref 0–35)
AST: 29 U/L (ref 0–37)
Albumin: 4.1 g/dL (ref 3.5–5.2)
Alkaline Phosphatase: 76 U/L (ref 39–117)
BUN: 16 mg/dL (ref 6–23)
CALCIUM: 9.7 mg/dL (ref 8.4–10.5)
CHLORIDE: 105 meq/L (ref 96–112)
CO2: 24 meq/L (ref 19–32)
Creat: 0.83 mg/dL (ref 0.50–1.10)
GLUCOSE: 102 mg/dL — AB (ref 70–99)
POTASSIUM: 5.4 meq/L — AB (ref 3.5–5.3)
Sodium: 141 mEq/L (ref 135–145)
TOTAL PROTEIN: 6.6 g/dL (ref 6.0–8.3)
Total Bilirubin: 0.3 mg/dL (ref 0.2–1.2)

## 2014-04-28 MED ORDER — SPIRONOLACTONE 50 MG PO TABS: 50.0000 mg | ORAL_TABLET | Freq: Two times a day (BID) | ORAL | Status: DC

## 2014-04-29 ENCOUNTER — Encounter: Payer: Self-pay | Admitting: Oncology

## 2014-04-29 ENCOUNTER — Other Ambulatory Visit: Payer: Medicare Other

## 2014-04-29 ENCOUNTER — Telehealth: Payer: Self-pay | Admitting: Oncology

## 2014-04-29 ENCOUNTER — Ambulatory Visit (HOSPITAL_BASED_OUTPATIENT_CLINIC_OR_DEPARTMENT_OTHER): Payer: Medicare Other | Admitting: Oncology

## 2014-04-29 ENCOUNTER — Other Ambulatory Visit (HOSPITAL_BASED_OUTPATIENT_CLINIC_OR_DEPARTMENT_OTHER): Payer: Medicare Other

## 2014-04-29 VITALS — BP 112/60 | HR 61 | Temp 98.7°F | Resp 20 | Ht 69.0 in | Wt 214.1 lb

## 2014-04-29 DIAGNOSIS — I82409 Acute embolism and thrombosis of unspecified deep veins of unspecified lower extremity: Secondary | ICD-10-CM | POA: Diagnosis not present

## 2014-04-29 DIAGNOSIS — D649 Anemia, unspecified: Secondary | ICD-10-CM

## 2014-04-29 DIAGNOSIS — D539 Nutritional anemia, unspecified: Secondary | ICD-10-CM

## 2014-04-29 DIAGNOSIS — Z9884 Bariatric surgery status: Secondary | ICD-10-CM | POA: Diagnosis not present

## 2014-04-29 DIAGNOSIS — N289 Disorder of kidney and ureter, unspecified: Secondary | ICD-10-CM | POA: Diagnosis not present

## 2014-04-29 DIAGNOSIS — Z853 Personal history of malignant neoplasm of breast: Secondary | ICD-10-CM | POA: Diagnosis not present

## 2014-04-29 DIAGNOSIS — D509 Iron deficiency anemia, unspecified: Secondary | ICD-10-CM

## 2014-04-29 LAB — IRON AND TIBC CHCC
%SAT: 27 % (ref 21–57)
Iron: 95 ug/dL (ref 41–142)
TIBC: 346 ug/dL (ref 236–444)
UIBC: 251 ug/dL (ref 120–384)

## 2014-04-29 LAB — CBC WITH DIFFERENTIAL/PLATELET
BASO%: 0.2 % (ref 0.0–2.0)
Basophils Absolute: 0 10*3/uL (ref 0.0–0.1)
EOS%: 1.6 % (ref 0.0–7.0)
Eosinophils Absolute: 0.1 10*3/uL (ref 0.0–0.5)
HEMATOCRIT: 39.3 % (ref 34.8–46.6)
HGB: 12.6 g/dL (ref 11.6–15.9)
LYMPH%: 38 % (ref 14.0–49.7)
MCH: 28.7 pg (ref 25.1–34.0)
MCHC: 32.1 g/dL (ref 31.5–36.0)
MCV: 89.3 fL (ref 79.5–101.0)
MONO#: 0.2 10*3/uL (ref 0.1–0.9)
MONO%: 5.8 % (ref 0.0–14.0)
NEUT#: 2 10*3/uL (ref 1.5–6.5)
NEUT%: 54.4 % (ref 38.4–76.8)
Platelets: 181 10*3/uL (ref 145–400)
RBC: 4.4 10*6/uL (ref 3.70–5.45)
RDW: 14 % (ref 11.2–14.5)
WBC: 3.6 10*3/uL — AB (ref 3.9–10.3)
lymph#: 1.4 10*3/uL (ref 0.9–3.3)

## 2014-04-29 LAB — COMPREHENSIVE METABOLIC PANEL (CC13)
ALT: 37 U/L (ref 0–55)
AST: 30 U/L (ref 5–34)
Albumin: 3.6 g/dL (ref 3.5–5.0)
Alkaline Phosphatase: 85 U/L (ref 40–150)
Anion Gap: 6 mEq/L (ref 3–11)
BILIRUBIN TOTAL: 0.34 mg/dL (ref 0.20–1.20)
BUN: 15.4 mg/dL (ref 7.0–26.0)
CHLORIDE: 104 meq/L (ref 98–109)
CO2: 28 mEq/L (ref 22–29)
CREATININE: 0.9 mg/dL (ref 0.6–1.1)
Calcium: 9.6 mg/dL (ref 8.4–10.4)
Glucose: 94 mg/dl (ref 70–140)
Potassium: 4.5 mEq/L (ref 3.5–5.1)
SODIUM: 138 meq/L (ref 136–145)
TOTAL PROTEIN: 7 g/dL (ref 6.4–8.3)

## 2014-04-29 LAB — FERRITIN CHCC: Ferritin: 698 ng/ml — ABNORMAL HIGH (ref 9–269)

## 2014-04-29 NOTE — Telephone Encounter (Signed)
, °

## 2014-04-29 NOTE — Progress Notes (Signed)
Hematology and Oncology Follow Up Visit  Kelli Martinez [id number] Jun 20, 1946 68 y.o. 04/29/2014 9:12 AM  CC: Kelli Martinez, M.D.  [name], MD    Principle Diagnosis:   This is a 68 year old female with the following issues:  1. Multifactorial anemia.  She has an element of iron deficiency, as well as anemia of renal disease, currently on p.o. iron replacement, and has not really required any IV iron recently. 2. History of breast cancer diagnosed in 1993. She is status post mastectomy followed by adjuvant radiation therapy.  She had also received high-dose chemotherapy and stem-cell transplantation in 1994.  She continues to be in remission from that standpoint.   3. History of gastric bypass surgery done in April 2011.   Interim History: [name] presents today for a followup visit. Since the last visit, she reports no new issues. She is reporting slight fatigue but overall her energy is reasonable. She reported some occasional unsteadiness and dizziness. She has been diagnosed with hypotension and managed by her primary care physician. She does not report any chest pain; does not report any difficulty breathing.  For the most part her performance status and activity level remain reasonable.  Her weight is stable at this time after she has lost over 100 lbs as her surgery but have been relatively unchanged and recent months. She does not report any seizures or syncope. She does not report any fevers or chills or sweats. Does not report any chest wall masses or discomfort. She does not report any nausea, vomiting or change in her bowel habits. He does not report any urinary symptoms. Rest of her review of systems unremarkable.   Medications: I have reviewed the patient's current medications.  Current Outpatient Prescriptions  Medication Sig Dispense Refill  . acarbose (PRECOSE) 25 MG tablet TAKE 1 TABLET THREE TIMES A DAY  270 tablet  0  . albuterol (PROVENTIL HFA;VENTOLIN  HFA) 108 (90 BASE) MCG/ACT inhaler Inhale 2 puffs into the lungs 4 (four) times daily as needed. As needed rescue inhaler  3 Inhaler  3  . atorvastatin (LIPITOR) 20 MG tablet Take 10 mg every other day      . clonazePAM (KLONOPIN) 0.5 MG tablet 1 or 2 for sleep as needed- take 1/2 hour before bed  30 tablet  0  . ergocalciferol (VITAMIN D2) 50000 UNITS capsule Take 50,000 Units by mouth every Friday.       . ferrous sulfate 325 (65 FE) MG tablet Take 325 mg by mouth daily.        . fluticasone (FLONASE) 50 MCG/ACT nasal spray Place 2 sprays into both nostrils daily.  48 g  3  . Fluticasone-Salmeterol (ADVAIR) 100-50 MCG/DOSE AEPB Inhale 1 puff into the lungs every 12 (twelve) hours. Rinse mouth  180 each  3  . gabapentin (NEURONTIN) 300 MG capsule TAKE 1 CAPSULE FOUR TIMES A DAY  360 capsule  1  . HYDROcodone-acetaminophen (NORCO) 5-325 MG per tablet Take 1 tablet by mouth 2 (two) times daily as needed for moderate pain.  60 tablet  0  . levothyroxine (SYNTHROID, LEVOTHROID) 125 MCG tablet TAKE 1 TABLET DAILY BEFORE BREAKFAST  90 tablet  2  . magnesium chloride (SLOW-MAG) 64 MG TBEC SR tablet Take 1 tablet (64 mg total) by mouth 2 (two) times daily.  60 tablet  6  . methocarbamol (ROBAXIN) 500 MG tablet Take 1 tablet (500 mg total) by mouth 2 (two) times daily.  60 tablet  3  .  methylcellulose (ARTIFICIAL TEARS) 1 % ophthalmic solution Place 1 drop into both eyes as needed. Dry eyes      . NON FORMULARY CPAP 6 [facility]      . omeprazole (PRILOSEC) 20 MG capsule TAKE 1 CAPSULE DAILY  90 capsule  0  . spironolactone (ALDACTONE) 50 MG tablet Take 1 tablet (50 mg total) by mouth 2 (two) times daily.  60 tablet  3  . temazepam (RESTORIL) 15 MG capsule 1-2 for sleep as needed  30 capsule  1  . triamcinolone cream (KENALOG) 0.1 % Apply 1 application topically 2 (two) times daily.  30 g  1  . vitamin B-12 (CYANOCOBALAMIN) 500 MCG tablet Take 500 mcg by mouth daily.        Marland Kitchen warfarin (COUMADIN) 10 MG tablet  One tablet Mon, Wed, Fri      . warfarin (COUMADIN) 5 MG tablet Take 2.5 mg by mouth daily. 12.5mg  Tues,Thurs, Sat, Sun       No current facility-administered medications for this visit.     Allergies:  Allergies  Allergen Reactions  . Adhesive [Tape] Rash    Past Medical History, Surgical history, Social history, and Family History were reviewed and updated.  Review of Systems:  Remaining ROS negative. Physical Exam: Blood pressure 112/60, pulse 61, temperature 98.7 F (37.1 C), temperature source Oral, resp. rate 20, height 5\' 9"  (1.753 m), weight 214 lb 1.6 oz (97.115 kg), SpO2 100.00%. ECOG: 1 General appearance: alert awake appeared in no active distress Head: Normocephalic, without obvious abnormality, atraumatic Neck: no adenopathy Lymph nodes: Cervical, supraclavicular, and axillary nodes normal. Heart:regular rate and rhythm, S1, S2 normal, no murmur, click, rub or gallop Chest wall examination: No masses or adenopathy. Scar tissue noted in the right axilla. Lung:chest clear, no wheezing, rales, normal symmetric air entry Abdomin: soft, non-tender, without masses or organomegaly EXT:no erythema, induration, or nodules. Her right lower extremity edema slightly less than the left which is unchanged on today's examination.   Lab Results: Lab Results  Component Value Date   WBC 3.6* 04/29/2014   HGB 12.6 04/29/2014   HCT 39.3 04/29/2014   MCV 89.3 04/29/2014   PLT 181 04/29/2014     Chemistry      Component Value Date/Time   NA 141 04/28/2014 1015   NA 139 10/29/2013 0927   K 5.4* 04/28/2014 1015   K 4.4 10/29/2013 0927   CL 105 04/28/2014 1015   CL 102 10/29/2012 1014   CO2 24 04/28/2014 1015   CO2 25 10/29/2013 0927   BUN 16 04/28/2014 1015   BUN 15.2 10/29/2013 0927   CREATININE 0.83 04/28/2014 1015   CREATININE 0.8 03/03/2014 0940   CREATININE 0.8 10/29/2013 0927      Component Value Date/Time   CALCIUM 9.7 04/28/2014 1015   CALCIUM 9.4 10/29/2013 0927   ALKPHOS 76  04/28/2014 1015   ALKPHOS 89 10/29/2013 0927   AST 29 04/28/2014 1015   AST 33 10/29/2013 0927   ALT 33 04/28/2014 1015   ALT 37 10/29/2013 0927   BILITOT 0.3 04/28/2014 1015   BILITOT 0.35 10/29/2013 0927       Impression and Plan: This is a 68 year old female with the following issues: 1. Multifactorial anemia.  This is predominately anemia of iron deficiency.  She is on iron replacement and continues to do quite well.  Her hemoglobin is normal today and I will check her iron levels. She does not require any IV iron at this time but  she is susceptible to iron losses after gastric bypass operation. 2. History of breast cancer.  She has no evidence of any recurrence at this time.  She is up to speed on her mammogram and physical examination.   3. History of deep vein thrombosis, currently anticoagulated with Coumadin.   4. Followup will be  In 6 months.    [facility], MD 9/30/20159:12 AM

## 2014-05-04 ENCOUNTER — Encounter: Payer: Self-pay | Admitting: Endocrinology

## 2014-05-04 ENCOUNTER — Ambulatory Visit (INDEPENDENT_AMBULATORY_CARE_PROVIDER_SITE_OTHER): Payer: Medicare Other | Admitting: Endocrinology

## 2014-05-04 VITALS — BP 122/68 | HR 78 | Temp 97.8°F | Resp 16 | Ht 69.0 in | Wt 214.8 lb

## 2014-05-04 DIAGNOSIS — E669 Obesity, unspecified: Secondary | ICD-10-CM

## 2014-05-04 DIAGNOSIS — R600 Localized edema: Secondary | ICD-10-CM

## 2014-05-04 DIAGNOSIS — E162 Hypoglycemia, unspecified: Secondary | ICD-10-CM

## 2014-05-04 DIAGNOSIS — K911 Postgastric surgery syndromes: Secondary | ICD-10-CM | POA: Diagnosis not present

## 2014-05-04 DIAGNOSIS — E119 Type 2 diabetes mellitus without complications: Secondary | ICD-10-CM

## 2014-05-04 DIAGNOSIS — E1169 Type 2 diabetes mellitus with other specified complication: Secondary | ICD-10-CM

## 2014-05-04 DIAGNOSIS — E161 Other hypoglycemia: Secondary | ICD-10-CM

## 2014-05-04 NOTE — Patient Instructions (Signed)
Use Mentos for low sugar

## 2014-05-04 NOTE — Progress Notes (Signed)
Patient ID: Kelli Martinez, female   DOB: [birth year], 68 y.o.   MRN: [id number]    Chief complaint: Followup of various issues  History of Present Illness:  The patient is seen for the following problems:  1. Postprandial hypoglycemia related to dumping syndrome  She has had postprandial hypoglycemia starting a couple of years after her gastric bypass surgery Her symptoms of blood sugars are mostly a feeling of significant weakness, some shakiness that she usually is eating fruit to relieve the symptoms She has been treated with acarbose recently. She did try verapamil empirically but this did not help.  She is taking her Precose before each meal, more recently right before eating She was told to take 2 tablets at lunch instead of at breakfast Has had only one significant episode since her last visit which was 2-3 hours after breakfast and glucose was 59.  During This is despite usually getting some protein at every meal  She says that during the night at 2 or 3 AM when she is getting up to go to the bathroom she feels weak and lightheaded but better when she has a fruit snack. Does not check her sugar at the time of symptoms  She has been to the dietitian and has been instructed on balanced low-fat meals with enough protein consistently and restricting carbohydrates including fruits.  She thinks she is following the diet especially getting protein, occasionally has a very light breakfast with fruit only  Wt Readings from Last 3 Encounters:  05/04/14 214 lb 12.8 oz (97.433 kg)  04/29/14 214 lb 1.6 oz (97.115 kg)  04/21/14 215 lb (97.523 kg)   2.  DVT prophylaxis: She has been followed by the Coumadin clinic    3. History of diabetes:  She  continues to have high normal A1c readings although most of her home readings are normal and only high reading of 174 was after treating hypoglycemia Currently on Precose low dose 3 times a day She is fairly active at work and is using  exercise bike Meal portion size is well controlled   Lab Results  Component Value Date   HGBA1C 6.5 01/23/2014   HGBA1C 6.6* 08/11/2013   HGBA1C 6.6* 05/07/2013   Lab Results  Component Value Date   MICROALBUR 0.3 08/11/2013   LDLCALC 66 01/23/2014   CREATININE 0.9 04/29/2014     4. Leg edema: She has had better control of her edema with using Aldactone This was reduced to 50 mg because of a high potassium level recently Also because of low normal blood pressure and cramps her Lasix was stopped Has tried elastic stockings but these tend to be uncomfortable. Albumin level previously normal      Medication List       This list is accurate as of: 05/04/14  8:53 AM.  Always use your most recent med list.               acarbose 25 MG tablet  Commonly known as:  PRECOSE  TAKE 1 TABLET THREE TIMES A DAY     albuterol 108 (90 BASE) MCG/ACT inhaler  Commonly known as:  PROVENTIL HFA;VENTOLIN HFA  Inhale 2 puffs into the lungs 4 (four) times daily as needed. As needed rescue inhaler     atorvastatin 20 MG tablet  Commonly known as:  LIPITOR  Take 10 mg every other day     clonazePAM 0.5 MG tablet  Commonly known as:  KLONOPIN  1 or 2 for sleep  as needed- take 1/2 hour before bed     ergocalciferol 50000 UNITS capsule  Commonly known as:  VITAMIN D2  Take 50,000 Units by mouth every Friday.     ferrous sulfate 325 (65 FE) MG tablet  Take 325 mg by mouth daily.     fluticasone 50 MCG/ACT nasal spray  Commonly known as:  FLONASE  Place 2 sprays into both nostrils daily.     Fluticasone-Salmeterol 100-50 MCG/DOSE Aepb  Commonly known as:  ADVAIR  Inhale 1 puff into the lungs every 12 (twelve) hours. Rinse mouth     gabapentin 300 MG capsule  Commonly known as:  NEURONTIN  TAKE 1 CAPSULE FOUR TIMES A DAY     HYDROcodone-acetaminophen 5-325 MG per tablet  Commonly known as:  NORCO  Take 1 tablet by mouth 2 (two) times daily as needed for moderate pain.      levothyroxine 125 MCG tablet  Commonly known as:  SYNTHROID, LEVOTHROID  TAKE 1 TABLET DAILY BEFORE BREAKFAST     magnesium chloride 64 MG Tbec SR tablet  Commonly known as:  SLOW-MAG  Take 1 tablet (64 mg total) by mouth 2 (two) times daily.     methocarbamol 500 MG tablet  Commonly known as:  ROBAXIN  Take 1 tablet (500 mg total) by mouth 2 (two) times daily.     methylcellulose 1 % ophthalmic solution  Commonly known as:  ARTIFICIAL TEARS  Place 1 drop into both eyes as needed. Dry eyes     NON FORMULARY  CPAP 6 [facility]     omeprazole 20 MG capsule  Commonly known as:  PRILOSEC  TAKE 1 CAPSULE DAILY     spironolactone 50 MG tablet  Commonly known as:  ALDACTONE  Take 1 tablet (50 mg total) by mouth 2 (two) times daily.     temazepam 15 MG capsule  Commonly known as:  RESTORIL  1-2 for sleep as needed     triamcinolone cream 0.1 %  Commonly known as:  KENALOG  Apply 1 application topically 2 (two) times daily.     vitamin B-12 500 MCG tablet  Commonly known as:  CYANOCOBALAMIN  Take 500 mcg by mouth daily.     warfarin 5 MG tablet  Commonly known as:  COUMADIN  Take 2.5 mg by mouth daily. 12.5mg  Tues,Thurs, Sat, Sun     warfarin 10 MG tablet  Commonly known as:  COUMADIN  One tablet Mon, Wed, Fri        Allergies:  Allergies  Allergen Reactions  . Adhesive [Tape] Rash    Past Medical History  Diagnosis Date  . Allergy   . Asthma   . Pulmonary embolism 1998 / 1994 /1968  . HX: breast cancer   . Blind right eye     hemorrhage  . Hypothyroid   . Clotting disorder   . Hypertension   . Blood transfusion   . Morbid obesity   . Syncope and collapse 2011    due to anemia  . GERD (gastroesophageal reflux disease)   . Hyperlipidemia   . Osteoporosis   . Psoriasis   . Gallstones   . Anemia   . Elevated liver enzymes   . Diabetes mellitus     hypoglycemia since large wt loss  . OSA (obstructive sleep apnea)     uses c pap  . Arthritis   .  Cancer     breast 1994  . Lymphedema of arm     RT  .  Back pain, chronic     GETS INJECTIONS IN BACK    Past Surgical History  Procedure Laterality Date  . Abdominal hysterectomy    . Mastectomy      Right  . Gastric bypass  2011    bariatric surgery  . Ivc filter      recurrent DVT  . Bone marrow transplant  1994  . Cardiovascular stress test  05/23/2005    EF 53%  . US echocardiography  10/27/08    EF 55-60%  . Knee arthrotomy  1998  . Carpal tunnell      bil  . Cataracts    . Hiatal hernia repair    . Cholecystectomy  05/10/2012    Procedure: LAPAROSCOPIC CHOLECYSTECTOMY WITH INTRAOPERATIVE CHOLANGIOGRAM;  Surgeon: Pedro Earls, MD;  Location: [facility];  Service: General;  Laterality: N/A;  Laparoscopic Cholecystectomy with Intraoperative Cholangiogram    Family History  Problem Relation Age of Onset  . Sudden death Mother     car accident  . Cancer Father 40    lung  . Cancer Sister 34    lung cancer  . Asthma Daughter     Social History:  reports that she quit smoking about 35 years ago. Her smoking use included Cigarettes. She has a 5 pack-year smoking history. She has never used smokeless tobacco. She reports that she does not drink alcohol or use illicit drugs.  Review of Systems   Lymphedema right arm, Persistent After radical  breast surgery, using  elastic compression stocking    Has had sharp pains in her left lower leg, somewhat relieved by  Gabapentin, has lumbar spinal stenosis  History of mild hypothyroidism: Adequately replaced  with 62.5 mcg  Lab Results  Component Value Date   TSH 1.582 04/28/2014   Hypercholesterolemia: LDL has been at target with Lipitor and she is compliant with this. Taking this also for cardiovascular prophylaxis  Lab Results  Component Value Date   CHOL 172 01/23/2014   HDL 100.00 01/23/2014   LDLCALC 66 01/23/2014   TRIG 28.0 01/23/2014   CHOLHDL 2 01/23/2014   Previous history of hypertension: Currently not on  any medications except diuretics. Also previously had orthostatic dizziness    LABS:  Appointment on 04/29/2014  Component Date Value Ref Range Status  . WBC 04/29/2014 3.6* 3.9 - 10.3 10e3/uL Final  . NEUT# 04/29/2014 2.0  1.5 - 6.5 10e3/uL Final  . HGB 04/29/2014 12.6  11.6 - 15.9 g/dL Final  . HCT 04/29/2014 39.3  34.8 - 46.6 % Final  . Platelets 04/29/2014 181  145 - 400 10e3/uL Final  . MCV 04/29/2014 89.3  79.5 - 101.0 fL Final  . MCH 04/29/2014 28.7  25.1 - 34.0 pg Final  . MCHC 04/29/2014 32.1  31.5 - 36.0 g/dL Final  . RBC 04/29/2014 4.40  3.70 - 5.45 10e6/uL Final  . RDW 04/29/2014 14.0  11.2 - 14.5 % Final  . lymph# 04/29/2014 1.4  0.9 - 3.3 10e3/uL Final  . MONO# 04/29/2014 0.2  0.1 - 0.9 10e3/uL Final  . Eosinophils Absolute 04/29/2014 0.1  0.0 - 0.5 10e3/uL Final  . Basophils Absolute 04/29/2014 0.0  0.0 - 0.1 10e3/uL Final  . NEUT% 04/29/2014 54.4  38.4 - 76.8 % Final  . LYMPH% 04/29/2014 38.0  14.0 - 49.7 % Final  . MONO% 04/29/2014 5.8  0.0 - 14.0 % Final  . EOS% 04/29/2014 1.6  0.0 - 7.0 % Final  . BASO% 04/29/2014 0.2  0.0 - 2.0 % Final  . Sodium 04/29/2014 138  136 - 145 mEq/L Final  . Potassium 04/29/2014 4.5  3.5 - 5.1 mEq/L Final  . Chloride 04/29/2014 104  98 - 109 mEq/L Final  . CO2 04/29/2014 28  22 - 29 mEq/L Final  . Glucose 04/29/2014 94  70 - 140 mg/dl Final  . BUN 04/29/2014 15.4  7.0 - 26.0 mg/dL Final  . Creatinine 04/29/2014 0.9  0.6 - 1.1 mg/dL Final  . Total Bilirubin 04/29/2014 0.34  0.20 - 1.20 mg/dL Final  . Alkaline Phosphatase 04/29/2014 85  40 - 150 U/L Final  . AST 04/29/2014 30  5 - 34 U/L Final  . ALT 04/29/2014 37  0 - 55 U/L Final  . Total Protein 04/29/2014 7.0  6.4 - 8.3 g/dL Final  . Albumin 04/29/2014 3.6  3.5 - 5.0 g/dL Final  . Calcium 04/29/2014 9.6  8.4 - 10.4 mg/dL Final  . Anion Gap 04/29/2014 6  3 - 11 mEq/L Final  . Iron 04/29/2014 95  41 - 142 ug/dL Final  . TIBC 04/29/2014 346  236 - 444 ug/dL Final  . UIBC  04/29/2014 251  120 - 384 ug/dL Final  . %SAT 04/29/2014 27  21 - 57 % Final  . Ferritin 04/29/2014 698* 9 - 269 ng/ml Final  Orders Only on 04/28/2014  Component Date Value Ref Range Status  . Sodium 04/28/2014 141  135 - 145 mEq/L Final  . Potassium 04/28/2014 5.4* 3.5 - 5.3 mEq/L Final   No visible hemolysis.  . Chloride 04/28/2014 105  96 - 112 mEq/L Final  . CO2 04/28/2014 24  19 - 32 mEq/L Final  . Glucose, Bld 04/28/2014 102* 70 - 99 mg/dL Final  . BUN 04/28/2014 16  6 - 23 mg/dL Final  . Creat 04/28/2014 0.83  0.50 - 1.10 mg/dL Final  . Total Bilirubin 04/28/2014 0.3  0.2 - 1.2 mg/dL Final  . Alkaline Phosphatase 04/28/2014 76  39 - 117 U/L Final  . AST 04/28/2014 29  0 - 37 U/L Final  . ALT 04/28/2014 33  0 - 35 U/L Final  . Total Protein 04/28/2014 6.6  6.0 - 8.3 g/dL Final  . Albumin 04/28/2014 4.1  3.5 - 5.2 g/dL Final  . Calcium 04/28/2014 9.7  8.4 - 10.5 mg/dL Final  . TSH 04/28/2014 1.582  0.350 - 4.500 uIU/mL Final  Anti-coag visit on 04/08/2014  Component Date Value Ref Range Status  . INR 04/08/2014 3.1   Final     EXAM:  BP 122/68  Pulse 78  Temp(Src) 97.8 F (36.6 C)  Resp 16  Ht 5\' 9"  (1.753 m)  Wt 214 lb 12.8 oz (97.433 kg)  BMI 31.71 kg/m2  SpO2 95%  Lower leg edema present, 1-2+,  larger on the left   Assessment/Plan:   1. Reactive/postprandial hypoglycemia from dumping syndrome.  She has periodic symptoms although not as frequent or severe and she is able to treat them adequately Discussed treating them with candy or glucose tablets instead of fruit or other foods She is consistent with her diet with balanced meals.  She will call if she has any worsening of symptoms   2. Venous leg edema treated with Aldactone only.  She Is having good control with 50 mg Aldactone now and her blood pressure is not as low  3. Potassium has been high as of a week ago although a repeat level was better the next day  Cieanna Stormes 05/04/2014, 8:53 AM

## 2014-05-06 ENCOUNTER — Ambulatory Visit (INDEPENDENT_AMBULATORY_CARE_PROVIDER_SITE_OTHER): Payer: Medicare Other | Admitting: *Deleted

## 2014-05-06 DIAGNOSIS — Z5181 Encounter for therapeutic drug level monitoring: Secondary | ICD-10-CM

## 2014-05-06 LAB — POCT INR: INR: 3

## 2014-05-10 ENCOUNTER — Other Ambulatory Visit: Payer: Self-pay | Admitting: Endocrinology

## 2014-05-11 ENCOUNTER — Other Ambulatory Visit: Payer: Self-pay | Admitting: Endocrinology

## 2014-06-01 ENCOUNTER — Other Ambulatory Visit: Payer: Self-pay | Admitting: Endocrinology

## 2014-06-07 ENCOUNTER — Other Ambulatory Visit: Payer: Self-pay | Admitting: Endocrinology

## 2014-06-16 ENCOUNTER — Ambulatory Visit (INDEPENDENT_AMBULATORY_CARE_PROVIDER_SITE_OTHER): Payer: Medicare Other

## 2014-06-16 DIAGNOSIS — Z5181 Encounter for therapeutic drug level monitoring: Secondary | ICD-10-CM | POA: Diagnosis not present

## 2014-06-16 LAB — POCT INR: INR: 2.2

## 2014-07-01 ENCOUNTER — Other Ambulatory Visit (INDEPENDENT_AMBULATORY_CARE_PROVIDER_SITE_OTHER): Payer: Medicare Other

## 2014-07-01 DIAGNOSIS — E119 Type 2 diabetes mellitus without complications: Secondary | ICD-10-CM

## 2014-07-01 DIAGNOSIS — E039 Hypothyroidism, unspecified: Secondary | ICD-10-CM | POA: Diagnosis not present

## 2014-07-01 DIAGNOSIS — R600 Localized edema: Secondary | ICD-10-CM | POA: Diagnosis not present

## 2014-07-01 LAB — COMPREHENSIVE METABOLIC PANEL
ALK PHOS: 83 U/L (ref 39–117)
ALT: 40 U/L — ABNORMAL HIGH (ref 0–35)
AST: 41 U/L — ABNORMAL HIGH (ref 0–37)
Albumin: 4 g/dL (ref 3.5–5.2)
BUN: 13 mg/dL (ref 6–23)
CO2: 23 mEq/L (ref 19–32)
CREATININE: 0.8 mg/dL (ref 0.4–1.2)
Calcium: 9 mg/dL (ref 8.4–10.5)
Chloride: 105 mEq/L (ref 96–112)
GFR: 94.87 mL/min (ref >60.00)
Glucose, Bld: 85 mg/dL (ref 70–99)
Potassium: 4.2 mEq/L (ref 3.5–5.1)
Sodium: 138 mEq/L (ref 135–145)
Total Bilirubin: 0.3 mg/dL (ref 0.2–1.2)
Total Protein: 7 g/dL (ref 6.0–8.3)

## 2014-07-01 LAB — LIPID PANEL
CHOLESTEROL: 184 mg/dL (ref 0–200)
HDL: 88.4 mg/dL (ref >39.00)
LDL CALC: 85 mg/dL (ref 0–99)
NonHDL: 95.6
Total CHOL/HDL Ratio: 2
Triglycerides: 51 mg/dL (ref 0.0–149.0)
VLDL: 10.2 mg/dL (ref 0.0–40.0)

## 2014-07-01 LAB — HEMOGLOBIN A1C: Hgb A1c MFr Bld: 6.5 % (ref 4.6–6.5)

## 2014-07-01 LAB — TSH: TSH: 1.05 u[IU]/mL (ref 0.35–4.50)

## 2014-07-03 DIAGNOSIS — M65872 Other synovitis and tenosynovitis, left ankle and foot: Secondary | ICD-10-CM | POA: Diagnosis not present

## 2014-07-03 DIAGNOSIS — M71572 Other bursitis, not elsewhere classified, left ankle and foot: Secondary | ICD-10-CM | POA: Diagnosis not present

## 2014-07-03 DIAGNOSIS — M7672 Peroneal tendinitis, left leg: Secondary | ICD-10-CM | POA: Diagnosis not present

## 2014-07-03 DIAGNOSIS — M71571 Other bursitis, not elsewhere classified, right ankle and foot: Secondary | ICD-10-CM | POA: Diagnosis not present

## 2014-07-03 DIAGNOSIS — M65871 Other synovitis and tenosynovitis, right ankle and foot: Secondary | ICD-10-CM | POA: Diagnosis not present

## 2014-07-03 DIAGNOSIS — M7671 Peroneal tendinitis, right leg: Secondary | ICD-10-CM | POA: Diagnosis not present

## 2014-07-04 ENCOUNTER — Other Ambulatory Visit: Payer: Self-pay | Admitting: Endocrinology

## 2014-07-06 ENCOUNTER — Ambulatory Visit (INDEPENDENT_AMBULATORY_CARE_PROVIDER_SITE_OTHER): Payer: Medicare Other | Admitting: Endocrinology

## 2014-07-06 ENCOUNTER — Encounter: Payer: Self-pay | Admitting: Endocrinology

## 2014-07-06 VITALS — BP 124/62 | HR 78 | Temp 98.3°F | Resp 14 | Ht 69.0 in | Wt 226.8 lb

## 2014-07-06 DIAGNOSIS — E162 Hypoglycemia, unspecified: Secondary | ICD-10-CM

## 2014-07-06 DIAGNOSIS — R600 Localized edema: Secondary | ICD-10-CM

## 2014-07-06 DIAGNOSIS — E161 Other hypoglycemia: Secondary | ICD-10-CM

## 2014-07-06 DIAGNOSIS — Z23 Encounter for immunization: Secondary | ICD-10-CM

## 2014-07-06 MED ORDER — METHOCARBAMOL 500 MG PO TABS: 500.0000 mg | ORAL_TABLET | Freq: Two times a day (BID) | ORAL | Status: DC

## 2014-07-06 NOTE — Progress Notes (Signed)
Patient ID: Kelli Martinez, female   DOB: [birth year], 68 y.o.   MRN: [id number]    Chief complaint: Followup of various issues  History of Present Illness:  The patient is seen for the following problems:  1. Postprandial hypoglycemia related to dumping syndrome  She has had postprandial hypoglycemia starting a couple of years after her gastric bypass surgery Her symptoms of blood sugars are mostly a feeling of significant weakness, some shakiness that she usually is eating fruit to relieve the symptoms She has been treated with acarbose  With some improvement. She did try verapamil empirically but this did not help.  She is taking her Precose before each meal,  usually right before eating She 2 tablets at breakfast  And 1 at other meals.  she has had a couple of episodes of low blood sugars but sometimes is symptomatic even when the blood sugar is about 70 -75  Has had low sugars  Mostly before lunch, lowest documented reading is 53  After lunch at 1:20 PM   No recent nocturnal symptoms Sometimes will have only a fruit in the morning and at times only oatmeal  ;her midmorning snack but also sometimes be only fruit    She usually has a protein at lunch and dinner  She thinks she is somewhat more active in the morning with house work  She has been to the dietitian and has been instructed on balanced low-fat meals with enough protein consistently and restricting carbohydrates including fruits.  She thinks she is following the diet especially getting protein, occasionally has a very light breakfast with fruit only  Wt Readings from Last 3 Encounters:  07/06/14 226 lb 12.8 oz (102.876 kg)  05/04/14 214 lb 12.8 oz (97.433 kg)  04/29/14 214 lb 1.6 oz (97.115 kg)   2.  History of diabetes:  She  Tends to have  High normal A1c readings although  She tends to have occasional postprandial hypoglycemia  however has checked only one reading fasting recently without any symptoms and it was  130 Currently on Precose  3 times a day She is fairly active at work and  At times will exercise Meal portion size is well controlled     Lab Results  Component Value Date   HGBA1C 6.5 07/01/2014   HGBA1C 6.5 01/23/2014   HGBA1C 6.6* 08/11/2013   Lab Results  Component Value Date   MICROALBUR 0.3 08/11/2013   LDLCALC 85 07/01/2014   CREATININE 0.8 07/01/2014        Medication List       This list is accurate as of: 07/06/14 10:29 AM.  Always use your most recent med list.               acarbose 25 MG tablet  Commonly known as:  PRECOSE  TAKE 1 TABLET THREE TIMES A DAY     albuterol 108 (90 BASE) MCG/ACT inhaler  Commonly known as:  PROVENTIL HFA;VENTOLIN HFA  Inhale 2 puffs into the lungs 4 (four) times daily as needed. As needed rescue inhaler     atorvastatin 20 MG tablet  Commonly known as:  LIPITOR  Take 10 mg every other day     clonazePAM 0.5 MG tablet  Commonly known as:  KLONOPIN  1 or 2 for sleep as needed- take 1/2 hour before bed     ergocalciferol 50000 UNITS capsule  Commonly known as:  VITAMIN D2  Take 50,000 Units by mouth every Friday.  ferrous sulfate 325 (65 FE) MG tablet  Take 325 mg by mouth daily.     fluticasone 50 MCG/ACT nasal spray  Commonly known as:  FLONASE  Place 2 sprays into both nostrils daily.     Fluticasone-Salmeterol 100-50 MCG/DOSE Aepb  Commonly known as:  ADVAIR  Inhale 1 puff into the lungs every 12 (twelve) hours. Rinse mouth     gabapentin 300 MG capsule  Commonly known as:  NEURONTIN  TAKE 1 CAPSULE FOUR TIMES A DAY     HYDROcodone-acetaminophen 5-325 MG per tablet  Commonly known as:  NORCO  Take 1 tablet by mouth 2 (two) times daily as needed for moderate pain.     KLOR-CON M20 20 MEQ tablet  Generic drug:  potassium chloride SA  TAKE 1 TABLET DAILY     levothyroxine 125 MCG tablet  Commonly known as:  SYNTHROID, LEVOTHROID  TAKE 1 TABLET DAILY BEFORE BREAKFAST     magnesium chloride 64 MG  Tbec SR tablet  Commonly known as:  SLOW-MAG  Take 1 tablet (64 mg total) by mouth 2 (two) times daily.     methocarbamol 500 MG tablet  Commonly known as:  ROBAXIN  Take 1 tablet (500 mg total) by mouth 2 (two) times daily.     methylcellulose 1 % ophthalmic solution  Commonly known as:  ARTIFICIAL TEARS  Place 1 drop into both eyes as needed. Dry eyes     NON FORMULARY  CPAP 6 [employer]     omeprazole 20 MG capsule  Commonly known as:  PRILOSEC  TAKE 1 CAPSULE DAILY     spironolactone 50 MG tablet  Commonly known as:  ALDACTONE  TAKE 1 TABLET DAILY     temazepam 15 MG capsule  Commonly known as:  RESTORIL  1-2 for sleep as needed     triamcinolone cream 0.1 %  Commonly known as:  KENALOG  Apply 1 application topically 2 (two) times daily.     vitamin B-12 500 MCG tablet  Commonly known as:  CYANOCOBALAMIN  Take 500 mcg by mouth daily.     warfarin 5 MG tablet  Commonly known as:  COUMADIN  Take 2.5 mg by mouth daily. 12.5mg  Tues,Thurs, Sat, Sun     warfarin 10 MG tablet  Commonly known as:  COUMADIN  One tablet Mon, Wed, Fri     warfarin 5 MG tablet  Commonly known as:  COUMADIN  TAKE 2 TABLETS (10 MG) ONCE A DAY (THIS IS TO REPLACE THE 10 MG TABLET)        Allergies:  Allergies  Allergen Reactions  . Adhesive [Tape] Rash    Past Medical History  Diagnosis Date  . Allergy   . Asthma   . Pulmonary embolism 1998 / 1994 /1968  . HX: breast cancer   . Blind right eye     hemorrhage  . Hypothyroid   . Clotting disorder   . Hypertension   . Blood transfusion   . Morbid obesity   . Syncope and collapse 2011    due to anemia  . GERD (gastroesophageal reflux disease)   . Hyperlipidemia   . Osteoporosis   . Psoriasis   . Gallstones   . Anemia   . Elevated liver enzymes   . Diabetes mellitus     hypoglycemia since large wt loss  . OSA (obstructive sleep apnea)     uses c pap  . Arthritis   . Cancer     breast 1994  .  Lymphedema of arm     RT   . Back pain, chronic     GETS INJECTIONS IN BACK    Past Surgical History  Procedure Laterality Date  . Abdominal hysterectomy    . Mastectomy      Right  . Gastric bypass  2011    bariatric surgery  . Ivc filter      recurrent DVT  . Bone marrow transplant  1994  . Cardiovascular stress test  05/23/2005    EF 53%  . US echocardiography  10/27/08    EF 55-60%  . Knee arthrotomy  1998  . Carpal tunnell      bil  . Cataracts    . Hiatal hernia repair    . Cholecystectomy  05/10/2012    Procedure: LAPAROSCOPIC CHOLECYSTECTOMY WITH INTRAOPERATIVE CHOLANGIOGRAM;  Surgeon: Pedro Earls, MD;  Location: [facility];  Service: General;  Laterality: N/A;  Laparoscopic Cholecystectomy with Intraoperative Cholangiogram    Family History  Problem Relation Age of Onset  . Sudden death Mother     car accident  . Cancer Father 42    lung  . Cancer Sister 31    lung cancer  . Asthma Daughter     Social History:  reports that she quit smoking about 35 years ago. Her smoking use included Cigarettes. She has a 5 pack-year smoking history. She has never used smokeless tobacco. She reports that she does not drink alcohol or use illicit drugs.  Review of Systems     She is now complaining of much more edema for the last 2-4 weeks despite taking Aldactone 50 mg She does not use elastic stocking because of her  Thigh-high stocking causing discomfort at the upper elastic part and she cannot make her knee-high  Stocking  Stay in Place on the foot She has not used Lasix mostly because of tendency to leg cramps and low normal blood pressure  Lymphedema right arm, Persistent After radical  breast surgery, using  elastic compression stocking    Has had sharp pains in her left lower leg, somewhat relieved by  Gabapentin, has lumbar spinal stenosis  History of mild hypothyroidism: Adequately replaced  with 62.5 mcg  With again normal TSH  Lab Results  Component Value Date   TSH 1.05 07/01/2014    Hypercholesterolemia: LDL has been at target with Lipitor and she is compliant with this. Taking this also for cardiovascular prophylaxis  Lab Results  Component Value Date   CHOL 184 07/01/2014   HDL 88.40 07/01/2014   LDLCALC 85 07/01/2014   TRIG 51.0 07/01/2014   CHOLHDL 2 07/01/2014   Previous history of hypertension: Currently not on any medications except diuretics. Also previously had orthostatic dizziness   She takes magnesium for muscle cramps Also has some muscle aches and back pain and wants to take Robaxin which helps  LABS:  Appointment on 07/01/2014  Component Date Value Ref Range Status  . Hgb A1c MFr Bld 07/01/2014 6.5  4.6 - 6.5 % Final   Glycemic Control Guidelines for People with Diabetes:Non Diabetic:  <6%Goal of Therapy: <7%Additional Action Suggested:  >8%   . Sodium 07/01/2014 138  135 - 145 mEq/L Final  . Potassium 07/01/2014 4.2  3.5 - 5.1 mEq/L Final  . Chloride 07/01/2014 105  96 - 112 mEq/L Final  . CO2 07/01/2014 23  19 - 32 mEq/L Final  . Glucose, Bld 07/01/2014 85  70 - 99 mg/dL Final  . BUN 07/01/2014 13  6 - 23 mg/dL Final  . Creatinine, Ser 07/01/2014 0.8  0.4 - 1.2 mg/dL Final  . Total Bilirubin 07/01/2014 0.3  0.2 - 1.2 mg/dL Final  . Alkaline Phosphatase 07/01/2014 83  39 - 117 U/L Final  . AST 07/01/2014 41* 0 - 37 U/L Final  . ALT 07/01/2014 40* 0 - 35 U/L Final  . Total Protein 07/01/2014 7.0  6.0 - 8.3 g/dL Final  . Albumin 07/01/2014 4.0  3.5 - 5.2 g/dL Final  . Calcium 07/01/2014 9.0  8.4 - 10.5 mg/dL Final  . GFR 07/01/2014 94.87  >60.00 mL/min Final  . Cholesterol 07/01/2014 184  0 - 200 mg/dL Final   ATP III Classification       Desirable:  < 200 mg/dL               Borderline High:  200 - 239 mg/dL          High:  > = 240 mg/dL  . Triglycerides 07/01/2014 51.0  0.0 - 149.0 mg/dL Final   Normal:  <150 mg/dLBorderline High:  150 - 199 mg/dL  . HDL 07/01/2014 88.40  >39.00 mg/dL Final  . VLDL 07/01/2014 10.2  0.0 - 40.0 mg/dL  Final  . LDL Cholesterol 07/01/2014 85  0 - 99 mg/dL Final  . Total CHOL/HDL Ratio 07/01/2014 2   Final                  Men          Women1/2 Average Risk     3.4          3.3Average Risk          5.0          4.42X Average Risk          9.6          7.13X Average Risk          15.0          11.0                      . NonHDL 07/01/2014 95.60   Final   NOTE:  Non-HDL goal should be 30 mg/dL higher than patient's LDL goal (i.e. LDL goal of < 70 mg/dL, would have non-HDL goal of < 100 mg/dL)  . TSH 07/01/2014 1.05  0.35 - 4.50 uIU/mL Final  Anti-coag visit on 06/16/2014  Component Date Value Ref Range Status  . INR 06/16/2014 2.2   Final     EXAM:  BP 124/62 mmHg  Pulse 78  Temp(Src) 98.3 F (36.8 C)  Resp 14  Ht 5\' 9"  (1.753 m)  Wt 226 lb 12.8 oz (102.876 kg)  BMI 33.48 kg/m2  SpO2 97%  Lower leg edema present, 2+,  More on the left with some induration No erythema  Assessment/Plan:   1. Reactive/postprandial hypoglycemia from dumping syndrome.  She has periodic symptoms although not as frequent or severe more recently She does not appear to be getting enough protein at meals and snacks and most of her sugars are low before or after lunch Discussed various sources of protein in how to balance her meals and snacks Also she needs to check her sugars more often especially when she is symptomatic and because of her diabetes History She will continue her Precose unchanged   2. Venous leg edema treated with Aldactone only.  She Is having much more edema now and not clear whether this is  related to her venous insufficiency or increased sodium intake She can try to take up to 3 days of Lasix periodically to get control Given her prescription for new knee-high stockings which she can wear more easily  3. Mild diabetes: A1c is 6.5 and she had a fasting glucose of 130.  4.  Minimal increase in liver functions, continue to follow.  Has had these before for no apparent reason  5.   Mild hypothyroidism: Adequately replaced  6.  Hyperlipidemia: Now well controlled  Total visit time = 25 minutes including counseling  Derran Sear 07/06/2014, 10:29 AM

## 2014-07-06 NOTE — Patient Instructions (Addendum)
Lasix as needed also when swelling more [place] for magnesium  Make notes about food intake on days sugar gets low

## 2014-07-09 ENCOUNTER — Ambulatory Visit (INDEPENDENT_AMBULATORY_CARE_PROVIDER_SITE_OTHER): Payer: Medicare Other | Admitting: Ophthalmology

## 2014-07-09 DIAGNOSIS — I10 Essential (primary) hypertension: Secondary | ICD-10-CM

## 2014-07-09 DIAGNOSIS — H2511 Age-related nuclear cataract, right eye: Secondary | ICD-10-CM

## 2014-07-09 DIAGNOSIS — E11329 Type 2 diabetes mellitus with mild nonproliferative diabetic retinopathy without macular edema: Secondary | ICD-10-CM | POA: Diagnosis not present

## 2014-07-09 DIAGNOSIS — H43813 Vitreous degeneration, bilateral: Secondary | ICD-10-CM | POA: Diagnosis not present

## 2014-07-09 DIAGNOSIS — H34832 Tributary (branch) retinal vein occlusion, left eye: Secondary | ICD-10-CM

## 2014-07-09 DIAGNOSIS — E11311 Type 2 diabetes mellitus with unspecified diabetic retinopathy with macular edema: Secondary | ICD-10-CM

## 2014-07-09 DIAGNOSIS — H35033 Hypertensive retinopathy, bilateral: Secondary | ICD-10-CM

## 2014-07-10 DIAGNOSIS — M7752 Other enthesopathy of left foot: Secondary | ICD-10-CM | POA: Diagnosis not present

## 2014-07-10 DIAGNOSIS — M65872 Other synovitis and tenosynovitis, left ankle and foot: Secondary | ICD-10-CM | POA: Diagnosis not present

## 2014-07-10 DIAGNOSIS — M65871 Other synovitis and tenosynovitis, right ankle and foot: Secondary | ICD-10-CM | POA: Diagnosis not present

## 2014-07-10 DIAGNOSIS — M7672 Peroneal tendinitis, left leg: Secondary | ICD-10-CM | POA: Diagnosis not present

## 2014-07-10 DIAGNOSIS — M7751 Other enthesopathy of right foot: Secondary | ICD-10-CM | POA: Diagnosis not present

## 2014-07-10 DIAGNOSIS — M7671 Peroneal tendinitis, right leg: Secondary | ICD-10-CM | POA: Diagnosis not present

## 2014-07-15 ENCOUNTER — Ambulatory Visit (INDEPENDENT_AMBULATORY_CARE_PROVIDER_SITE_OTHER): Payer: Medicare Other

## 2014-07-15 DIAGNOSIS — Z5181 Encounter for therapeutic drug level monitoring: Secondary | ICD-10-CM

## 2014-07-15 LAB — POCT INR: INR: 2.4

## 2014-07-27 ENCOUNTER — Other Ambulatory Visit: Payer: Self-pay | Admitting: Endocrinology

## 2014-07-27 DIAGNOSIS — M7671 Peroneal tendinitis, right leg: Secondary | ICD-10-CM | POA: Diagnosis not present

## 2014-07-27 DIAGNOSIS — M65871 Other synovitis and tenosynovitis, right ankle and foot: Secondary | ICD-10-CM | POA: Diagnosis not present

## 2014-07-27 DIAGNOSIS — M71571 Other bursitis, not elsewhere classified, right ankle and foot: Secondary | ICD-10-CM | POA: Diagnosis not present

## 2014-07-29 ENCOUNTER — Other Ambulatory Visit: Payer: Self-pay | Admitting: *Deleted

## 2014-07-29 MED ORDER — ERGOCALCIFEROL 1.25 MG (50000 UT) PO CAPS: 50000.0000 [IU] | ORAL_CAPSULE | ORAL | Status: DC

## 2014-08-11 DIAGNOSIS — L603 Nail dystrophy: Secondary | ICD-10-CM | POA: Diagnosis not present

## 2014-08-11 DIAGNOSIS — E1151 Type 2 diabetes mellitus with diabetic peripheral angiopathy without gangrene: Secondary | ICD-10-CM | POA: Diagnosis not present

## 2014-08-11 DIAGNOSIS — I739 Peripheral vascular disease, unspecified: Secondary | ICD-10-CM | POA: Diagnosis not present

## 2014-08-12 ENCOUNTER — Other Ambulatory Visit: Payer: Self-pay | Admitting: Endocrinology

## 2014-08-26 ENCOUNTER — Ambulatory Visit (INDEPENDENT_AMBULATORY_CARE_PROVIDER_SITE_OTHER): Payer: Medicare Other

## 2014-08-26 DIAGNOSIS — Z5181 Encounter for therapeutic drug level monitoring: Secondary | ICD-10-CM

## 2014-08-26 LAB — POCT INR: INR: 2.1

## 2014-09-16 ENCOUNTER — Other Ambulatory Visit: Payer: Self-pay | Admitting: Endocrinology

## 2014-09-30 ENCOUNTER — Other Ambulatory Visit (INDEPENDENT_AMBULATORY_CARE_PROVIDER_SITE_OTHER): Payer: Medicare Other

## 2014-09-30 ENCOUNTER — Other Ambulatory Visit: Payer: Self-pay | Admitting: Endocrinology

## 2014-09-30 DIAGNOSIS — E119 Type 2 diabetes mellitus without complications: Secondary | ICD-10-CM

## 2014-09-30 DIAGNOSIS — E161 Other hypoglycemia: Secondary | ICD-10-CM

## 2014-09-30 DIAGNOSIS — R600 Localized edema: Secondary | ICD-10-CM

## 2014-09-30 DIAGNOSIS — E669 Obesity, unspecified: Secondary | ICD-10-CM | POA: Diagnosis not present

## 2014-09-30 DIAGNOSIS — E162 Hypoglycemia, unspecified: Secondary | ICD-10-CM

## 2014-09-30 DIAGNOSIS — E1169 Type 2 diabetes mellitus with other specified complication: Secondary | ICD-10-CM

## 2014-09-30 LAB — COMPREHENSIVE METABOLIC PANEL
ALBUMIN: 4 g/dL (ref 3.5–5.2)
ALK PHOS: 77 U/L (ref 39–117)
ALT: 54 U/L — AB (ref 0–35)
AST: 40 U/L — ABNORMAL HIGH (ref 0–37)
BUN: 14 mg/dL (ref 6–23)
CO2: 30 mEq/L (ref 19–32)
Calcium: 9.5 mg/dL (ref 8.4–10.5)
Chloride: 102 mEq/L (ref 96–112)
Creatinine, Ser: 0.92 mg/dL (ref 0.40–1.20)
GFR: 78.35 mL/min (ref >60.00)
GLUCOSE: 109 mg/dL — AB (ref 70–99)
Potassium: 4.2 mEq/L (ref 3.5–5.1)
Sodium: 137 mEq/L (ref 135–145)
Total Bilirubin: 0.4 mg/dL (ref 0.2–1.2)
Total Protein: 7.2 g/dL (ref 6.0–8.3)

## 2014-09-30 LAB — HEMOGLOBIN A1C: Hgb A1c MFr Bld: 6.6 % — ABNORMAL HIGH (ref 4.6–6.5)

## 2014-10-05 ENCOUNTER — Encounter: Payer: Self-pay | Admitting: Endocrinology

## 2014-10-05 ENCOUNTER — Ambulatory Visit (INDEPENDENT_AMBULATORY_CARE_PROVIDER_SITE_OTHER): Payer: Medicare Other | Admitting: Endocrinology

## 2014-10-05 VITALS — BP 103/69 | HR 68 | Temp 97.6°F | Resp 16 | Ht 69.0 in | Wt 233.6 lb

## 2014-10-05 DIAGNOSIS — E162 Hypoglycemia, unspecified: Secondary | ICD-10-CM

## 2014-10-05 DIAGNOSIS — R5383 Other fatigue: Secondary | ICD-10-CM | POA: Diagnosis not present

## 2014-10-05 DIAGNOSIS — R609 Edema, unspecified: Secondary | ICD-10-CM

## 2014-10-05 DIAGNOSIS — I890 Lymphedema, not elsewhere classified: Secondary | ICD-10-CM | POA: Diagnosis not present

## 2014-10-05 DIAGNOSIS — E119 Type 2 diabetes mellitus without complications: Secondary | ICD-10-CM

## 2014-10-05 DIAGNOSIS — E039 Hypothyroidism, unspecified: Secondary | ICD-10-CM

## 2014-10-05 DIAGNOSIS — E161 Other hypoglycemia: Secondary | ICD-10-CM

## 2014-10-05 NOTE — Patient Instructions (Signed)
Take Lasix daily till swelling down

## 2014-10-05 NOTE — Progress Notes (Signed)
Patient ID: Kelli Martinez, female   DOB: 05-Oct-1945, 69 y.o.   MRN: [id number]    Chief complaint: Followup of various issues  History of Present Illness:  The patient is seen for the following problems:  1. Postprandial hypoglycemia related to dumping syndrome  She has had postprandial hypoglycemia starting a couple of years after her gastric bypass surgery Her symptoms of blood sugars are mostly a feeling of significant weakness, some shakiness that she usually is eating fruit to relieve the symptoms She has been treated with acarbose  With some improvement. She did try verapamil empirically but this did not help.  She is taking her Precose before each meal,  usually right before eating She 2 tablets at breakfast and 1 at other meals.  Recent history: she has had a couple of episodes of low blood sugarsbut not in the last week or so. Most of her episodes appear to be around lunchtime, lowest documented reading is 42 She has started eating some protein with breakfast consistently but does not always have a midmorning snack For breakfast she will have bacon, eggs and a fruit in the morning usually  She usually has a protein at lunch and dinner  She has been to the dietitian and has been instructed on balanced low-fat meals with enough protein consistently and restricting carbohydrates including fruits.  She thinks she is following the diet especially getting protein, occasionally has a very light breakfast with fruit only  Wt Readings from Last 3 Encounters:  10/05/14 233 lb 9.6 oz (105.96 kg)  07/06/14 226 lb 12.8 oz (102.876 kg)  05/04/14 214 lb 12.8 oz (97.433 kg)   2.  History of diabetes:   Usually has a normal A1c readings with slightly higher level now. She once had a glucose of 180 after eating a waffle However has checked her blood sugar somewhat sporadically and none after dinner Recent blood sugars range from 58-186 but only 2 low blood sugars below 65 and high  only once after breakfast on 2/20, fasting glucose readings: 90, 119 and no readings after supper  Currently on Precose  3 times a day She is fairly active when she is feeling good and will also get on her treadmill Meal portion size is usually well controlled    Lab Results  Component Value Date   HGBA1C 6.6* 09/30/2014   HGBA1C 6.5 07/01/2014   HGBA1C 6.5 01/23/2014   Lab Results  Component Value Date   MICROALBUR 0.3 08/11/2013   LDLCALC 85 07/01/2014   CREATININE 0.92 09/30/2014        Medication List       This list is accurate as of: 10/05/14  8:55 AM.  Always use your most recent med list.               acarbose 25 MG tablet  Commonly known as:  PRECOSE  TAKE 1 TABLET THREE TIMES A DAY     albuterol 108 (90 BASE) MCG/ACT inhaler  Commonly known as:  PROVENTIL HFA;VENTOLIN HFA  Inhale 2 puffs into the lungs 4 (four) times daily as needed. As needed rescue inhaler     atorvastatin 20 MG tablet  Commonly known as:  LIPITOR  Take 10 mg every other day     clonazePAM 0.5 MG tablet  Commonly known as:  KLONOPIN  1 or 2 for sleep as needed- take 1/2 hour before bed     ergocalciferol 50000 UNITS capsule  Commonly known as:  VITAMIN D2  Take 1 capsule (50,000 Units total) by mouth every Friday.     ferrous sulfate 325 (65 FE) MG tablet  Take 325 mg by mouth daily.     fluticasone 50 MCG/ACT nasal spray  Commonly known as:  FLONASE  Place 2 sprays into both nostrils daily.     Fluticasone-Salmeterol 100-50 MCG/DOSE Aepb  Commonly known as:  ADVAIR  Inhale 1 puff into the lungs every 12 (twelve) hours. Rinse mouth     furosemide 40 MG tablet  Commonly known as:  LASIX  TAKE 1 TABLET EVERY MORNING     gabapentin 300 MG capsule  Commonly known as:  NEURONTIN  TAKE 1 CAPSULE FOUR TIMES A DAY     HYDROcodone-acetaminophen 5-325 MG per tablet  Commonly known as:  NORCO  Take 1 tablet by mouth 2 (two) times daily as needed for moderate pain.      KLOR-CON M20 20 MEQ tablet  Generic drug:  potassium chloride SA  TAKE 1 TABLET DAILY     levothyroxine 125 MCG tablet  Commonly known as:  SYNTHROID, LEVOTHROID  TAKE 1 TABLET DAILY BEFORE BREAKFAST     magnesium chloride 64 MG Tbec SR tablet  Commonly known as:  SLOW-MAG  Take 1 tablet (64 mg total) by mouth 2 (two) times daily.     methocarbamol 500 MG tablet  Commonly known as:  ROBAXIN  Take 1 tablet (500 mg total) by mouth 2 (two) times daily.     methylcellulose 1 % ophthalmic solution  Commonly known as:  ARTIFICIAL TEARS  Place 1 drop into both eyes as needed. Dry eyes     NON FORMULARY  CPAP 6 [facility]     omeprazole 20 MG capsule  Commonly known as:  PRILOSEC  TAKE 1 CAPSULE DAILY     spironolactone 50 MG tablet  Commonly known as:  ALDACTONE  TAKE 1 TABLET DAILY     temazepam 15 MG capsule  Commonly known as:  RESTORIL  1-2 for sleep as needed     triamcinolone cream 0.1 %  Commonly known as:  KENALOG  Apply 1 application topically 2 (two) times daily.     vitamin B-12 500 MCG tablet  Commonly known as:  CYANOCOBALAMIN  Take 500 mcg by mouth daily.     warfarin 5 MG tablet  Commonly known as:  COUMADIN  Take 2.5 mg by mouth daily. 12.5mg  Tues,Thurs, Sat, Sun     warfarin 10 MG tablet  Commonly known as:  COUMADIN  One tablet Mon, Wed, Fri     warfarin 5 MG tablet  Commonly known as:  COUMADIN  TAKE 2 TABLETS ONCE DAILY (THIS IS TO REPLACE THE 10 MG TABLET)        Allergies:  Allergies  Allergen Reactions  . Adhesive [Tape] Rash    Past Medical History  Diagnosis Date  . Allergy   . Asthma   . Pulmonary embolism 1998 / 1994 /1968  . HX: breast cancer   . Blind right eye     hemorrhage  . Hypothyroid   . Clotting disorder   . Hypertension   . Blood transfusion   . Morbid obesity   . Syncope and collapse 2011    due to anemia  . GERD (gastroesophageal reflux disease)   . Hyperlipidemia   . Osteoporosis   . Psoriasis   .  Gallstones   . Anemia   . Elevated liver enzymes   . Diabetes mellitus  hypoglycemia since large wt loss  . OSA (obstructive sleep apnea)     uses c pap  . Arthritis   . Cancer     breast 1994  . Lymphedema of arm     RT  . Back pain, chronic     GETS INJECTIONS IN BACK    Past Surgical History  Procedure Laterality Date  . Abdominal hysterectomy    . Mastectomy      Right  . Gastric bypass  2011    bariatric surgery  . Ivc filter      recurrent DVT  . Bone marrow transplant  1994  . Cardiovascular stress test  05/23/2005    EF 53%  . US echocardiography  10/27/08    EF 55-60%  . Knee arthrotomy  1998  . Carpal tunnell      bil  . Cataracts    . Hiatal hernia repair    . Cholecystectomy  05/10/2012    Procedure: LAPAROSCOPIC CHOLECYSTECTOMY WITH INTRAOPERATIVE CHOLANGIOGRAM;  Surgeon: Pedro Earls, MD;  Location: [facility];  Service: General;  Laterality: N/A;  Laparoscopic Cholecystectomy with Intraoperative Cholangiogram    Family History  Problem Relation Age of Onset  . Sudden death Mother     car accident  . Cancer Father 21    lung  . Cancer Sister 3    lung cancer  . Asthma Daughter     Social History:  reports that she quit smoking about 36 years ago. Her smoking use included Cigarettes. She has a 5 pack-year smoking history. She has never used smokeless tobacco. She reports that she does not drink alcohol or use illicit drugs.  Review of Systems   Tired weak    She is now complaining of much more edema both in her legs and right upper extremities  She also has gained significant amount of weight She thinks that she has more fatigue and weakness when she has edema  She has tried to take the Lasix a little more often as needed and swelling is a little better this morning Previously would have leg cramps with taking Lasix but she thinks this is better with drinking more water However she tries to drink at least 6 glasses of water a day  Edema  has occurred despite taking Aldactone 50 mg; with 100 mg she had mild hyperkalemia  She does not use elastic stocking because of her  Thigh-high stocking causing discomfort at the upper elastic part and she cannot make her knee-high  Stockings stay on the  the foot  Lymphedema right arm, Persistent After radical  breast surgery, using  elastic compression stocking but it is worse now and she thinks that the sleeve is very guarded with holes    Has had sharp pains in her left lower leg, somewhat relieved by  Gabapentin, has lumbar spinal stenosis; also occasional feeling of numbness in the left leg  History of mild hypothyroidism: Adequately replaced  with 62.5 mcg , previously has had normal TSH  Lab Results  Component Value Date   TSH 1.05 07/01/2014    Hypercholesterolemia: LDL has been at target with Lipitor and she is compliant with this. Taking this also for cardiovascular prophylaxis  Lab Results  Component Value Date   CHOL 184 07/01/2014   HDL 88.40 07/01/2014   LDLCALC 85 07/01/2014   TRIG 51.0 07/01/2014   CHOLHDL 2 07/01/2014   Previous history of hypertension: Currently not on any medications except diuretics.  No recent  orthostatic dizziness even with increasing Lasix   She takes magnesium for muscle cramps Also has some muscle aches and back pain and has relief with methocarbamol  LABS:  Lab on 09/30/2014  Component Date Value Ref Range Status  . Hgb A1c MFr Bld 09/30/2014 6.6* 4.6 - 6.5 % Final   Glycemic Control Guidelines for People with Diabetes:Non Diabetic:  <6%Goal of Therapy: <7%Additional Action Suggested:  >8%   . Sodium 09/30/2014 137  135 - 145 mEq/L Final  . Potassium 09/30/2014 4.2  3.5 - 5.1 mEq/L Final  . Chloride 09/30/2014 102  96 - 112 mEq/L Final  . CO2 09/30/2014 30  19 - 32 mEq/L Final  . Glucose, Bld 09/30/2014 109* 70 - 99 mg/dL Final  . BUN 09/30/2014 14  6 - 23 mg/dL Final  . Creatinine, Ser 09/30/2014 0.92  0.40 - 1.20 mg/dL Final  .  Total Bilirubin 09/30/2014 0.4  0.2 - 1.2 mg/dL Final  . Alkaline Phosphatase 09/30/2014 77  39 - 117 U/L Final  . AST 09/30/2014 40* 0 - 37 U/L Final  . ALT 09/30/2014 54* 0 - 35 U/L Final  . Total Protein 09/30/2014 7.2  6.0 - 8.3 g/dL Final  . Albumin 09/30/2014 4.0  3.5 - 5.2 g/dL Final  . Calcium 09/30/2014 9.5  8.4 - 10.5 mg/dL Final  . GFR 09/30/2014 78.35  >60.00 mL/min Final     EXAM:  BP 103/69 mmHg  Pulse 68  Temp(Src) 97.6 F (36.4 C)  Resp 16  Ht 5\' 9"  (1.753 m)  Wt 233 lb 9.6 oz (105.96 kg)  BMI 34.48 kg/m2  SpO2 99%  Lower leg edema present,  More on the left with some induration Appears to have puffiness of the right upper extremities throughout  Heart tones normal. Lungs clear  Large ecchymosis on the left forearm at the site of blood draw.  No infection present  Assessment/Plan:   1. Reactive/postprandial hypoglycemia from dumping syndrome.  She has periodic symptoms although not as frequent or severe and is responding to changes in diet as well as Precose before meals  She will continue her Precose unchanged and emphasized the need for midmorning snack with protein as most of her low sugars tend to be before lunch   2. Venous leg edema treated with Aldactone and not clear why she is having worsening edema Appears to be needing Lasix but is able to take this without cramps more regularly now Has generalized edema possibly but etiology unclear since she has not had any hypoalbuminemia, CHF or proteinuria Unable to increase Aldactone because of hyperkalemia  She can try to take Lasix more consistently Given her prescription for new knee-high stockings which she can wear more easily Also new prescription given for upper arm sleeve on the right side for her lymphedema  3. Mild diabetes: A1c is 6.6 and she needs to work on her weight and more consistent exercise when she can tolerate it  4.  Minimal increase in liver functions, unchanged.  Has had these  before for no apparent reason and are not significantly high at this time  5.  Leg cramps: Better controlled now  6.  Recently has excessive bruising and will need to get INR this week  Total visit time including review of multiple complaints, lab results, multiple problems and examination = 25 minutes  Abbigail Anstey 10/05/2014, 8:55 AM

## 2014-10-07 ENCOUNTER — Ambulatory Visit (INDEPENDENT_AMBULATORY_CARE_PROVIDER_SITE_OTHER): Payer: Medicare Other | Admitting: General Practice

## 2014-10-07 ENCOUNTER — Telehealth: Payer: Self-pay | Admitting: Internal Medicine

## 2014-10-07 DIAGNOSIS — Z5181 Encounter for therapeutic drug level monitoring: Secondary | ICD-10-CM

## 2014-10-07 LAB — POCT INR: INR: 2

## 2014-10-07 MED ORDER — AMOXICILLIN 500 MG PO CAPS: 500.0000 mg | ORAL_CAPSULE | Freq: Three times a day (TID) | ORAL | Status: DC

## 2014-10-07 NOTE — Telephone Encounter (Signed)
Patient struggling with allergies.  She said her throat is very sore and scratchy, she is coughing a lot.  She has stuffy nose, thick mucus running down the throat, sinus pressure, headache.  Sunlight gives her a headache. Had a low grade fever yesterday, but no fever today.  Would like Rx called into [facility].  Allergies  Allergen Reactions  . Adhesive [Tape] Rash   Current Outpatient Prescriptions on File Prior to Visit  Medication Sig Dispense Refill  . acarbose (PRECOSE) 25 MG tablet TAKE 1 TABLET THREE TIMES A DAY 270 tablet 1  . albuterol (PROVENTIL HFA;VENTOLIN HFA) 108 (90 BASE) MCG/ACT inhaler Inhale 2 puffs into the lungs 4 (four) times daily as needed. As needed rescue inhaler 3 Inhaler 3  . atorvastatin (LIPITOR) 20 MG tablet Take 10 mg every other day    . clonazePAM (KLONOPIN) 0.5 MG tablet 1 or 2 for sleep as needed- take 1/2 hour before bed 30 tablet 0  . ergocalciferol (VITAMIN D2) 50000 UNITS capsule Take 1 capsule (50,000 Units total) by mouth every Friday. 12 capsule 3  . ferrous sulfate 325 (65 FE) MG tablet Take 325 mg by mouth daily.      . fluticasone (FLONASE) 50 MCG/ACT nasal spray Place 2 sprays into both nostrils daily. 48 g 3  . Fluticasone-Salmeterol (ADVAIR) 100-50 MCG/DOSE AEPB Inhale 1 puff into the lungs every 12 (twelve) hours. Rinse mouth 180 each 3  . furosemide (LASIX) 40 MG tablet TAKE 1 TABLET EVERY MORNING 90 tablet 1  . gabapentin (NEURONTIN) 300 MG capsule TAKE 1 CAPSULE FOUR TIMES A DAY 360 capsule 0  . HYDROcodone-acetaminophen (NORCO) 5-325 MG per tablet Take 1 tablet by mouth 2 (two) times daily as needed for moderate pain. 60 tablet 0  . KLOR-CON M20 20 MEQ tablet TAKE 1 TABLET DAILY (Patient not taking: Reported on 10/05/2014) 90 tablet 1  . levothyroxine (SYNTHROID, LEVOTHROID) 125 MCG tablet TAKE 1 TABLET DAILY BEFORE BREAKFAST 90 tablet 2  . magnesium chloride (SLOW-MAG) 64 MG TBEC SR tablet Take 1 tablet (64 mg total) by mouth 2 (two)  times daily. 60 tablet 6  . methocarbamol (ROBAXIN) 500 MG tablet Take 1 tablet (500 mg total) by mouth 2 (two) times daily. 60 tablet 0  . methylcellulose (ARTIFICIAL TEARS) 1 % ophthalmic solution Place 1 drop into both eyes as needed. Dry eyes    . NON FORMULARY CPAP 6 [facility]    . omeprazole (PRILOSEC) 20 MG capsule TAKE 1 CAPSULE DAILY 90 capsule 1  . spironolactone (ALDACTONE) 50 MG tablet TAKE 1 TABLET DAILY 90 tablet 1  . temazepam (RESTORIL) 15 MG capsule 1-2 for sleep as needed 30 capsule 1  . triamcinolone cream (KENALOG) 0.1 % Apply 1 application topically 2 (two) times daily. 30 g 1  . vitamin B-12 (CYANOCOBALAMIN) 500 MCG tablet Take 500 mcg by mouth daily.      Marland Kitchen warfarin (COUMADIN) 10 MG tablet One tablet Mon, Wed, Fri    . warfarin (COUMADIN) 5 MG tablet Take 2.5 mg by mouth daily. 12.5mg  Tues,Thurs, Sat, Sun    . warfarin (COUMADIN) 5 MG tablet TAKE 2 TABLETS ONCE DAILY (THIS IS TO REPLACE THE 10 MG TABLET) 180 tablet 1   No current facility-administered medications on file prior to visit.

## 2014-10-07 NOTE — Progress Notes (Signed)
Agree with plan 

## 2014-10-07 NOTE — Telephone Encounter (Signed)
Rx for Amoxicillin sent to pharmacy.  Pt notified.  Nothing further needed.

## 2014-10-07 NOTE — Telephone Encounter (Signed)
Sounds more like infection. Suggest first try amoxacillin 500 mg, # 21, 1 tid x 7 days.  Throat lozenges may help.

## 2014-10-07 NOTE — Progress Notes (Signed)
Pre visit review using our clinic review tool, if applicable. No additional management support is needed unless otherwise documented below in the visit note. 

## 2014-10-20 DIAGNOSIS — E1151 Type 2 diabetes mellitus with diabetic peripheral angiopathy without gangrene: Secondary | ICD-10-CM | POA: Diagnosis not present

## 2014-10-20 DIAGNOSIS — M25571 Pain in right ankle and joints of right foot: Secondary | ICD-10-CM | POA: Diagnosis not present

## 2014-10-20 DIAGNOSIS — I739 Peripheral vascular disease, unspecified: Secondary | ICD-10-CM | POA: Diagnosis not present

## 2014-10-20 DIAGNOSIS — M65871 Other synovitis and tenosynovitis, right ankle and foot: Secondary | ICD-10-CM | POA: Diagnosis not present

## 2014-10-20 DIAGNOSIS — L603 Nail dystrophy: Secondary | ICD-10-CM | POA: Diagnosis not present

## 2014-10-20 DIAGNOSIS — M65872 Other synovitis and tenosynovitis, left ankle and foot: Secondary | ICD-10-CM | POA: Diagnosis not present

## 2014-10-20 DIAGNOSIS — M25572 Pain in left ankle and joints of left foot: Secondary | ICD-10-CM | POA: Diagnosis not present

## 2014-10-20 DIAGNOSIS — M79609 Pain in unspecified limb: Secondary | ICD-10-CM | POA: Diagnosis not present

## 2014-10-20 DIAGNOSIS — M2012 Hallux valgus (acquired), left foot: Secondary | ICD-10-CM | POA: Diagnosis not present

## 2014-10-20 DIAGNOSIS — M2011 Hallux valgus (acquired), right foot: Secondary | ICD-10-CM | POA: Diagnosis not present

## 2014-10-20 DIAGNOSIS — M792 Neuralgia and neuritis, unspecified: Secondary | ICD-10-CM | POA: Diagnosis not present

## 2014-10-28 ENCOUNTER — Other Ambulatory Visit: Payer: Self-pay | Admitting: Endocrinology

## 2014-10-28 ENCOUNTER — Ambulatory Visit (HOSPITAL_BASED_OUTPATIENT_CLINIC_OR_DEPARTMENT_OTHER): Payer: Medicare Other | Admitting: Oncology

## 2014-10-28 ENCOUNTER — Other Ambulatory Visit (HOSPITAL_BASED_OUTPATIENT_CLINIC_OR_DEPARTMENT_OTHER): Payer: Medicare Other

## 2014-10-28 ENCOUNTER — Telehealth: Payer: Self-pay | Admitting: Oncology

## 2014-10-28 VITALS — BP 130/74 | HR 63 | Temp 98.2°F | Resp 18 | Ht 69.0 in | Wt 233.7 lb

## 2014-10-28 DIAGNOSIS — Z86718 Personal history of other venous thrombosis and embolism: Secondary | ICD-10-CM | POA: Diagnosis not present

## 2014-10-28 DIAGNOSIS — R109 Unspecified abdominal pain: Secondary | ICD-10-CM

## 2014-10-28 DIAGNOSIS — D509 Iron deficiency anemia, unspecified: Secondary | ICD-10-CM

## 2014-10-28 DIAGNOSIS — R1013 Epigastric pain: Secondary | ICD-10-CM

## 2014-10-28 DIAGNOSIS — E119 Type 2 diabetes mellitus without complications: Secondary | ICD-10-CM | POA: Diagnosis not present

## 2014-10-28 DIAGNOSIS — R600 Localized edema: Secondary | ICD-10-CM

## 2014-10-28 DIAGNOSIS — Z853 Personal history of malignant neoplasm of breast: Secondary | ICD-10-CM | POA: Diagnosis not present

## 2014-10-28 DIAGNOSIS — D649 Anemia, unspecified: Secondary | ICD-10-CM

## 2014-10-28 DIAGNOSIS — D539 Nutritional anemia, unspecified: Secondary | ICD-10-CM

## 2014-10-28 DIAGNOSIS — R7989 Other specified abnormal findings of blood chemistry: Secondary | ICD-10-CM

## 2014-10-28 DIAGNOSIS — Z9049 Acquired absence of other specified parts of digestive tract: Secondary | ICD-10-CM

## 2014-10-28 LAB — COMPREHENSIVE METABOLIC PANEL (CC13)
ALT: 65 U/L — AB (ref 0–55)
AST: 46 U/L — ABNORMAL HIGH (ref 5–34)
Albumin: 3.4 g/dL — ABNORMAL LOW (ref 3.5–5.0)
Alkaline Phosphatase: 80 U/L (ref 40–150)
Anion Gap: 7 mEq/L (ref 3–11)
BUN: 17.4 mg/dL (ref 7.0–26.0)
CALCIUM: 9.1 mg/dL (ref 8.4–10.4)
CO2: 28 mEq/L (ref 22–29)
Chloride: 105 mEq/L (ref 98–109)
Creatinine: 0.8 mg/dL (ref 0.6–1.1)
EGFR: 86 mL/min/{1.73_m2} — ABNORMAL LOW (ref >90)
GLUCOSE: 109 mg/dL (ref 70–140)
Potassium: 4 mEq/L (ref 3.5–5.1)
Sodium: 140 mEq/L (ref 136–145)
Total Bilirubin: 0.36 mg/dL (ref 0.20–1.20)
Total Protein: 6.9 g/dL (ref 6.4–8.3)

## 2014-10-28 LAB — CBC WITH DIFFERENTIAL/PLATELET
BASO%: 1 % (ref 0.0–2.0)
Basophils Absolute: 0 10*3/uL (ref 0.0–0.1)
EOS ABS: 0.1 10*3/uL (ref 0.0–0.5)
EOS%: 1.8 % (ref 0.0–7.0)
HCT: 40.6 % (ref 34.8–46.6)
HGB: 12.7 g/dL (ref 11.6–15.9)
LYMPH#: 1.7 10*3/uL (ref 0.9–3.3)
LYMPH%: 37.8 % (ref 14.0–49.7)
MCH: 27.8 pg (ref 25.1–34.0)
MCHC: 31.2 g/dL — ABNORMAL LOW (ref 31.5–36.0)
MCV: 89.2 fL (ref 79.5–101.0)
MONO#: 0.3 10*3/uL (ref 0.1–0.9)
MONO%: 5.8 % (ref 0.0–14.0)
NEUT#: 2.4 10*3/uL (ref 1.5–6.5)
NEUT%: 53.6 % (ref 38.4–76.8)
Platelets: 179 10*3/uL (ref 145–400)
RBC: 4.56 10*6/uL (ref 3.70–5.45)
RDW: 14.2 % (ref 11.2–14.5)
WBC: 4.4 10*3/uL (ref 3.9–10.3)

## 2014-10-28 LAB — IRON AND TIBC CHCC
%SAT: 30 % (ref 21–57)
Iron: 104 ug/dL (ref 41–142)
TIBC: 343 ug/dL (ref 236–444)
UIBC: 238 ug/dL (ref 120–384)

## 2014-10-28 LAB — FERRITIN CHCC: Ferritin: 723 ng/ml — ABNORMAL HIGH (ref 9–269)

## 2014-10-28 NOTE — Progress Notes (Signed)
Hematology and Oncology Follow Up Visit  Kelli Martinez [id number] 1945/11/03 69 y.o. 10/28/2014 8:59 AM  CC: Kelli Martinez, M.D.  Kelli Caprice Hassell Done, MD    Principle Diagnosis:   This is a 69 year old female with the following issues:  1. Multifactorial anemia.  She has an element of iron deficiency, as well as anemia of renal disease, currently on p.o. iron replacement, and has not really required any IV iron recently. 2. History of breast cancer diagnosed in 1993. She is status post mastectomy followed by adjuvant radiation therapy.  She had also received high-dose chemotherapy and stem-cell transplantation in 1994.  She continues to be in remission from that standpoint.   3. History of gastric bypass surgery Martinez in April 2011. 4. Status post endoscopy on 11/01/2009 and showed a small nodule biopsy-proven to be GIST.   Interim History: Kelli Martinez presents today for a followup visit. Since the last visit, she continues to report few issues. She is reporting intermittent fatigue which is interfering with her function at times. 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 But at times she feels debilitated with her fatigue. She reported some occasional unsteadiness and dizziness.  She has reported intermittent abdominal pain as well but have actually gained weight and have not lost weight. She has had difficulties controlling her blood sugar and Dr. Dwyane Dee is helping adjusting her diabetic medication. She also was noted to have fluid retention and was prescribed Lasix by Dr. Dwyane Dee as well. Lasix have helped her lower extremity swelling.  She does not report any chest pain; does not report any difficulty breathing.  For the most part her performance status and activity level remain reasonable.  Her weight is slightly increased. She does not report any seizures or syncope. She does not report any fevers or chills or sweats. Does not report  any chest wall masses or discomfort. She does not report any nausea, vomiting or change in her bowel habits. He does not report any urinary symptoms. Rest of her review of systems unremarkable.   Medications: I have reviewed the patient's current medications.  Current Outpatient Prescriptions  Medication Sig Dispense Refill  . acarbose (PRECOSE) 25 MG tablet TAKE 1 TABLET THREE TIMES A DAY 270 tablet 1  . albuterol (PROVENTIL HFA;VENTOLIN HFA) 108 (90 BASE) MCG/ACT inhaler Inhale 2 puffs into the lungs 4 (four) times daily as needed. As needed rescue inhaler 3 Inhaler 3  . amoxicillin (AMOXIL) 500 MG capsule Take 1 capsule (500 mg total) by mouth 3 (three) times daily. 21 capsule 0  . atorvastatin (LIPITOR) 20 MG tablet Take 10 mg every other day    . ergocalciferol (VITAMIN D2) 50000 UNITS capsule Take 1 capsule (50,000 Units total) by mouth every Friday. 12 capsule 3  . ferrous sulfate 325 (65 FE) MG tablet Take 325 mg by mouth daily.      . fluticasone (FLONASE) 50 MCG/ACT nasal spray Place 2 sprays into both nostrils daily. 48 g 3  . Fluticasone-Salmeterol (ADVAIR) 100-50 MCG/DOSE AEPB Inhale 1 puff into the lungs every 12 (twelve) hours. Rinse mouth 180 each 3  . furosemide (LASIX) 40 MG tablet TAKE 1 TABLET EVERY MORNING (Patient taking differently: whole tablet every other day and 1/2 other days) 90 tablet 1  . gabapentin (NEURONTIN) 300 MG capsule TAKE 1 CAPSULE FOUR TIMES A DAY 360 capsule 0  . HYDROcodone-acetaminophen (NORCO) 5-325 MG per tablet Take 1 tablet by  mouth 2 (two) times daily as needed for moderate pain. 60 tablet 0  . levothyroxine (SYNTHROID, LEVOTHROID) 125 MCG tablet TAKE 1 TABLET DAILY BEFORE BREAKFAST 90 tablet 2  . magnesium chloride (SLOW-MAG) 64 MG TBEC SR tablet Take 1 tablet (64 mg total) by mouth 2 (two) times daily. 60 tablet 6  . methocarbamol (ROBAXIN) 500 MG tablet Take 1 tablet (500 mg total) by mouth 2 (two) times daily. 60 tablet 0  . methylcellulose  (ARTIFICIAL TEARS) 1 % ophthalmic solution Place 1 drop into both eyes as needed. Dry eyes    . NON FORMULARY CPAP 6 [facility]    . omeprazole (PRILOSEC) 20 MG capsule TAKE 1 CAPSULE DAILY 90 capsule 1  . spironolactone (ALDACTONE) 50 MG tablet TAKE 1 TABLET DAILY 90 tablet 1  . temazepam (RESTORIL) 15 MG capsule 1-2 for sleep as needed 30 capsule 1  . triamcinolone cream (KENALOG) 0.1 % Apply 1 application topically 2 (two) times daily. 30 g 1  . vitamin B-12 (CYANOCOBALAMIN) 500 MCG tablet Take 500 mcg by mouth daily.      Marland Kitchen warfarin (COUMADIN) 10 MG tablet One tablet Mon, Wed, Fri    . warfarin (COUMADIN) 5 MG tablet Take 2.5 mg by mouth daily. 12.5mg  Tues,Thurs, Sat, Sun    . warfarin (COUMADIN) 5 MG tablet TAKE 2 TABLETS ONCE DAILY (THIS IS TO REPLACE THE 10 MG TABLET) 180 tablet 1  . KLOR-CON M20 20 MEQ tablet TAKE 1 TABLET DAILY (Patient not taking: Reported on 10/05/2014) 90 tablet 1   No current facility-administered medications for this visit.     Allergies:  Allergies  Allergen Reactions  . Adhesive [Tape] Rash    Past Medical History, Surgical history, Social history, and Family History were reviewed and updated.   Physical Exam: Blood pressure 130/74, pulse 63, temperature 98.2 F (36.8 C), temperature source Oral, resp. rate 18, height 5\' 9"  (1.753 m), weight 233 lb 11.2 oz (106.006 kg), SpO2 100 %. ECOG: 1 General appearance: alert awake appeared in no active distress Head: Normocephalic, without obvious abnormality, atraumatic Neck: no adenopathy Lymph nodes: Cervical, supraclavicular, and axillary nodes normal. Heart:regular rate and rhythm, S1, S2 normal, no murmur, click, rub or gallop Chest wall examination: No masses or adenopathy. Scar tissue noted in the right axilla. Lung:chest clear, no wheezing, rales, normal symmetric air entry Abdomin: soft, non-tender, without masses or organomegaly EXT:no erythema, induration, or nodules. Bilateral lower extremity edema  noted.   Lab Results: Lab Results  Component Value Date   WBC 4.4 10/28/2014   HGB 12.7 10/28/2014   HCT 40.6 10/28/2014   MCV 89.2 10/28/2014   PLT 179 10/28/2014     Chemistry      Component Value Date/Time   NA 137 09/30/2014 0854   NA 138 04/29/2014 0845   K 4.2 09/30/2014 0854   K 4.5 04/29/2014 0845   CL 102 09/30/2014 0854   CL 102 10/29/2012 1014   CO2 30 09/30/2014 0854   CO2 28 04/29/2014 0845   BUN 14 09/30/2014 0854   BUN 15.4 04/29/2014 0845   CREATININE 0.92 09/30/2014 0854   CREATININE 0.9 04/29/2014 0845   CREATININE 0.83 04/28/2014 1015      Component Value Date/Time   CALCIUM 9.5 09/30/2014 0854   CALCIUM 9.6 04/29/2014 0845   ALKPHOS 77 09/30/2014 0854   ALKPHOS 85 04/29/2014 0845   AST 40* 09/30/2014 0854   AST 30 04/29/2014 0845   ALT 54* 09/30/2014 0854   ALT  37 04/29/2014 0845   BILITOT 0.4 09/30/2014 0854   BILITOT 0.34 04/29/2014 0845       Impression and Plan: This is a 69 year old female with the following issues: 1. Multifactorial anemia.  This is predominately anemia of iron deficiency.  She is on iron replacement and continues to do quite well.  Her hemoglobin is normal today and her iron levels are currently pending. In September 2015 her iron stores were fully normal. 2. History of breast cancer.  She has no evidence of any recurrence at this time.    3. History of deep vein thrombosis, currently anticoagulated with Coumadin.  4. History of GIST detected on endoscopy 5 years ago. She does have symptoms of abdominal pain, fatigue although no hematochezia or melena. And given her history of breast cancer who would be reasonable to repeat imaging studies to rule out any tumor recurrence. 5. Abnormal liver function tests: Unclear reason at this time her AST and ALT are mildly abnormal and fluctuating. CT scan images might help clarify this issue as well. She did have a cholecystectomy which does not seem have helped her liver function  enzymes. 6. Followup will be  In 6 months sooner if she has any abnormalities on her CT scan.   [facility], MD 3/30/20168:59 AM

## 2014-10-28 NOTE — Telephone Encounter (Signed)
Gave avs & calendar for September. Also gave CT contrast for scan.

## 2014-11-05 ENCOUNTER — Encounter (HOSPITAL_COMMUNITY): Payer: Self-pay

## 2014-11-05 ENCOUNTER — Telehealth: Payer: Self-pay | Admitting: *Deleted

## 2014-11-05 ENCOUNTER — Ambulatory Visit (HOSPITAL_COMMUNITY)
Admission: RE | Admit: 2014-11-05 | Discharge: 2014-11-05 | Disposition: A | Discharge status: Home / Self Care | Payer: Medicare Other | Source: Ambulatory Visit | Attending: Oncology | Admitting: Oncology

## 2014-11-05 DIAGNOSIS — Z9889 Other specified postprocedural states: Secondary | ICD-10-CM | POA: Diagnosis not present

## 2014-11-05 DIAGNOSIS — Z9011 Acquired absence of right breast and nipple: Secondary | ICD-10-CM | POA: Diagnosis not present

## 2014-11-05 DIAGNOSIS — Z90710 Acquired absence of both cervix and uterus: Secondary | ICD-10-CM | POA: Diagnosis not present

## 2014-11-05 DIAGNOSIS — R1013 Epigastric pain: Secondary | ICD-10-CM | POA: Diagnosis not present

## 2014-11-05 DIAGNOSIS — Z9884 Bariatric surgery status: Secondary | ICD-10-CM | POA: Insufficient documentation

## 2014-11-05 DIAGNOSIS — Z853 Personal history of malignant neoplasm of breast: Secondary | ICD-10-CM

## 2014-11-05 DIAGNOSIS — R600 Localized edema: Secondary | ICD-10-CM | POA: Diagnosis not present

## 2014-11-05 DIAGNOSIS — Z9049 Acquired absence of other specified parts of digestive tract: Secondary | ICD-10-CM | POA: Diagnosis not present

## 2014-11-05 DIAGNOSIS — C50911 Malignant neoplasm of unspecified site of right female breast: Secondary | ICD-10-CM | POA: Diagnosis not present

## 2014-11-05 MED ORDER — IOHEXOL 300 MG/ML  SOLN
100.0000 mL | Freq: Once | INTRAMUSCULAR | Status: AC | PRN
  Administered 2014-11-05: 100 mL via INTRAVENOUS

## 2014-11-05 NOTE — Telephone Encounter (Signed)
-----   Message from Wyatt Portela, MD sent at 11/05/2014 10:30 AM EDT ----- Please call her to let her know the scan is normal

## 2014-11-05 NOTE — Telephone Encounter (Signed)
Per Dr. Alen Blew, I informed patient that her CT scan was normal. This message was left on her cell phone.

## 2014-11-18 ENCOUNTER — Ambulatory Visit (INDEPENDENT_AMBULATORY_CARE_PROVIDER_SITE_OTHER): Payer: Medicare Other | Admitting: General Practice

## 2014-11-18 DIAGNOSIS — Z5181 Encounter for therapeutic drug level monitoring: Secondary | ICD-10-CM

## 2014-11-18 LAB — POCT INR: INR: 1.9

## 2014-11-18 NOTE — Progress Notes (Signed)
Agree with plan 

## 2014-11-18 NOTE — Progress Notes (Signed)
Pre visit review using our clinic review tool, if applicable. No additional management support is needed unless otherwise documented below in the visit note. 

## 2014-11-25 ENCOUNTER — Other Ambulatory Visit: Payer: Self-pay | Admitting: Endocrinology

## 2014-11-30 ENCOUNTER — Telehealth: Payer: Self-pay | Admitting: Endocrinology

## 2014-11-30 NOTE — Telephone Encounter (Signed)
Please see below.

## 2014-11-30 NOTE — Telephone Encounter (Signed)
Patient called back and would like another Rx for the compression stocking and arm sleeve  She will be here tomorrow to get labs; could we have rx ready    Thank you

## 2014-11-30 NOTE — Telephone Encounter (Signed)
Patient need refill of musscle relaxer, been out for two weeks.

## 2014-12-01 ENCOUNTER — Other Ambulatory Visit (INDEPENDENT_AMBULATORY_CARE_PROVIDER_SITE_OTHER): Payer: Medicare Other

## 2014-12-01 ENCOUNTER — Emergency Department (INDEPENDENT_AMBULATORY_CARE_PROVIDER_SITE_OTHER): Payer: Medicare Other

## 2014-12-01 ENCOUNTER — Emergency Department (INDEPENDENT_AMBULATORY_CARE_PROVIDER_SITE_OTHER)
Admission: EM | Admit: 2014-12-01 | Discharge: 2014-12-01 | Disposition: A | Discharge status: Home / Self Care | Payer: Medicare Other | Source: Home / Self Care | Attending: Family Medicine | Admitting: Family Medicine

## 2014-12-01 ENCOUNTER — Encounter (HOSPITAL_COMMUNITY): Payer: Self-pay | Admitting: Emergency Medicine

## 2014-12-01 DIAGNOSIS — R609 Edema, unspecified: Secondary | ICD-10-CM

## 2014-12-01 DIAGNOSIS — S6992XA Unspecified injury of left wrist, hand and finger(s), initial encounter: Secondary | ICD-10-CM | POA: Diagnosis not present

## 2014-12-01 DIAGNOSIS — E039 Hypothyroidism, unspecified: Secondary | ICD-10-CM

## 2014-12-01 DIAGNOSIS — E119 Type 2 diabetes mellitus without complications: Secondary | ICD-10-CM

## 2014-12-01 DIAGNOSIS — G5751 Tarsal tunnel syndrome, right lower limb: Secondary | ICD-10-CM | POA: Diagnosis not present

## 2014-12-01 DIAGNOSIS — M25532 Pain in left wrist: Secondary | ICD-10-CM

## 2014-12-01 DIAGNOSIS — M7751 Other enthesopathy of right foot: Secondary | ICD-10-CM | POA: Diagnosis not present

## 2014-12-01 DIAGNOSIS — M79609 Pain in unspecified limb: Secondary | ICD-10-CM | POA: Diagnosis not present

## 2014-12-01 DIAGNOSIS — G5752 Tarsal tunnel syndrome, left lower limb: Secondary | ICD-10-CM | POA: Diagnosis not present

## 2014-12-01 DIAGNOSIS — M792 Neuralgia and neuritis, unspecified: Secondary | ICD-10-CM | POA: Diagnosis not present

## 2014-12-01 DIAGNOSIS — M7752 Other enthesopathy of left foot: Secondary | ICD-10-CM | POA: Diagnosis not present

## 2014-12-01 LAB — COMPREHENSIVE METABOLIC PANEL
ALBUMIN: 3.6 g/dL (ref 3.5–5.2)
ALT: 35 U/L (ref 0–35)
AST: 31 U/L (ref 0–37)
Alkaline Phosphatase: 77 U/L (ref 39–117)
BUN: 13 mg/dL (ref 6–23)
CALCIUM: 9.1 mg/dL (ref 8.4–10.5)
CHLORIDE: 104 meq/L (ref 96–112)
CO2: 27 meq/L (ref 19–32)
Creatinine, Ser: 0.79 mg/dL (ref 0.40–1.20)
GFR: 93.36 mL/min (ref >60.00)
GLUCOSE: 95 mg/dL (ref 70–99)
POTASSIUM: 4.1 meq/L (ref 3.5–5.1)
Sodium: 137 mEq/L (ref 135–145)
Total Bilirubin: 0.4 mg/dL (ref 0.2–1.2)
Total Protein: 6.7 g/dL (ref 6.0–8.3)

## 2014-12-01 LAB — MICROALBUMIN / CREATININE URINE RATIO
Creatinine,U: 103 mg/dL
Microalb Creat Ratio: 0.7 mg/g (ref 0.0–30.0)
Microalb, Ur: <0.7 mg/dL (ref 0.0–1.9)

## 2014-12-01 LAB — URINALYSIS, ROUTINE W REFLEX MICROSCOPIC
BILIRUBIN URINE: NEGATIVE
Hgb urine dipstick: NEGATIVE
Ketones, ur: NEGATIVE
Leukocytes, UA: NEGATIVE
Nitrite: NEGATIVE
RBC / HPF: NONE SEEN (ref 0–?)
Specific Gravity, Urine: 1.01 (ref 1.000–1.030)
Total Protein, Urine: NEGATIVE
URINE GLUCOSE: NEGATIVE
Urobilinogen, UA: 1 (ref 0.0–1.0)
pH: 7 (ref 5.0–8.0)

## 2014-12-01 LAB — T4, FREE: FREE T4: 0.89 ng/dL (ref 0.60–1.60)

## 2014-12-01 LAB — TSH: TSH: 0.92 u[IU]/mL (ref 0.35–4.50)

## 2014-12-01 NOTE — ED Provider Notes (Signed)
CSN: [id number]     Arrival date & time 12/01/14  1136 History   First MD Initiated Contact with Patient 12/01/14 1251     Chief Complaint  Patient presents with  . Wrist Pain   (Consider location/radiation/quality/duration/timing/severity/associated sxs/prior Treatment) HPI       69 year old female with history of breast cancer as well as osteoarthritis, osteoporosis, presents complaining of left wrist pain. She was helping lift up a person from a chair yesterday. Soon after that she began to expand his pain in the ulnar portion of her left wrist with some mild swelling. Pain has increased since yesterday. No numbness. No other injury.  Past Medical History  Diagnosis Date  . Allergy   . Asthma   . Pulmonary embolism 1998 / 1994 /1968  . HX: breast cancer   . Blind right eye     hemorrhage  . Hypothyroid   . Clotting disorder   . Hypertension   . Blood transfusion   . Morbid obesity   . Syncope and collapse 2011    due to anemia  . GERD (gastroesophageal reflux disease)   . Hyperlipidemia   . Osteoporosis   . Psoriasis   . Gallstones   . Anemia   . Elevated liver enzymes   . Diabetes mellitus     hypoglycemia since large wt loss  . OSA (obstructive sleep apnea)     uses c pap  . Arthritis   . Cancer     breast 1994  . Lymphedema of arm     RT  . Back pain, chronic     GETS INJECTIONS IN BACK   Past Surgical History  Procedure Laterality Date  . Abdominal hysterectomy    . Mastectomy      Right  . Gastric bypass  2011    bariatric surgery  . Ivc filter      recurrent DVT  . Bone marrow transplant  1994  . Cardiovascular stress test  05/23/2005    EF 53%  . US echocardiography  10/27/08    EF 55-60%  . Knee arthrotomy  1998  . Carpal tunnell      bil  . Cataracts    . Hiatal hernia repair    . Cholecystectomy  05/10/2012    Procedure: LAPAROSCOPIC CHOLECYSTECTOMY WITH INTRAOPERATIVE CHOLANGIOGRAM;  Surgeon: Pedro Earls, MD;  Location: [facility];   Service: General;  Laterality: N/A;  Laparoscopic Cholecystectomy with Intraoperative Cholangiogram   Family History  Problem Relation Age of Onset  . Sudden death Mother     car accident  . Cancer Father 65    lung  . Cancer Sister 74    lung cancer  . Asthma Daughter    History  Substance Use Topics  . Smoking status: Former Smoker -- 0.50 packs/day for 10 years    Types: Cigarettes    Quit date: 07/31/1978  . Smokeless tobacco: Never Used  . Alcohol Use: No   OB History    No data available     Review of Systems  Musculoskeletal:       Wrist pain, see history of present illness  All other systems reviewed and are negative.   Allergies  Adhesive  Home Medications   Prior to Admission medications   Medication Sig Start Date End Date Taking? Authorizing Provider  acarbose (PRECOSE) 25 MG tablet TAKE 1 TABLET THREE TIMES A DAY 09/30/14   Elayne Snare, MD  albuterol (PROVENTIL HFA;VENTOLIN HFA) 108 (90 BASE) MCG/ACT  inhaler Inhale 2 puffs into the lungs 4 (four) times daily as needed. As needed rescue inhaler 04/21/14 04/21/15  Deneise Lever, MD  amoxicillin (AMOXIL) 500 MG capsule Take 1 capsule (500 mg total) by mouth 3 (three) times daily. 10/07/14   Deneise Lever, MD  atorvastatin (LIPITOR) 20 MG tablet Take 10 mg every other day 11/13/13   Elayne Snare, MD  ergocalciferol (VITAMIN D2) 50000 UNITS capsule Take 1 capsule (50,000 Units total) by mouth every Friday. 07/29/14   Elayne Snare, MD  ferrous sulfate 325 (65 FE) MG tablet Take 325 mg by mouth daily.      Historical Provider, MD  fluticasone (FLONASE) 50 MCG/ACT nasal spray Place 2 sprays into both nostrils daily. 04/21/14 08/02/15  Deneise Lever, MD  Fluticasone-Salmeterol (ADVAIR) 100-50 MCG/DOSE AEPB Inhale 1 puff into the lungs every 12 (twelve) hours. Rinse mouth 04/21/14 04/21/15  Deneise Lever, MD  furosemide (LASIX) 40 MG tablet TAKE 1 TABLET EVERY MORNING Patient taking differently: whole tablet every other day  and 1/2 other days 07/27/14   Elayne Snare, MD  gabapentin (NEURONTIN) 300 MG capsule TAKE 1 CAPSULE FOUR TIMES A DAY 09/16/14   Elayne Snare, MD  HYDROcodone-acetaminophen (NORCO) 5-325 MG per tablet Take 1 tablet by mouth 2 (two) times daily as needed for moderate pain. 03/30/14   Elayne Snare, MD  KLOR-CON M20 20 MEQ tablet TAKE 1 TABLET DAILY 10/28/14   Elayne Snare, MD  levothyroxine (SYNTHROID, LEVOTHROID) 125 MCG tablet TAKE 1 TABLET DAILY BEFORE BREAKFAST 11/25/14   Elayne Snare, MD  magnesium chloride (SLOW-MAG) 64 MG TBEC SR tablet Take 1 tablet (64 mg total) by mouth 2 (two) times daily. 07/21/13   Elayne Snare, MD  methocarbamol (ROBAXIN) 500 MG tablet Take 1 tablet (500 mg total) by mouth 2 (two) times daily. 07/06/14   Elayne Snare, MD  methylcellulose (ARTIFICIAL TEARS) 1 % ophthalmic solution Place 1 drop into both eyes as needed. Dry eyes    Historical Provider, MD  NON FORMULARY CPAP 6 [place] Provider, MD  omeprazole (PRILOSEC) 20 MG capsule TAKE 1 CAPSULE DAILY 10/28/14   Elayne Snare, MD  spironolactone (ALDACTONE) 50 MG tablet TAKE 1 TABLET DAILY 09/30/14   Elayne Snare, MD  temazepam (RESTORIL) 15 MG capsule 1-2 for sleep as needed 04/21/13   Deneise Lever, MD  triamcinolone cream (KENALOG) 0.1 % Apply 1 application topically 2 (two) times daily. 01/28/14   Elayne Snare, MD  vitamin B-12 (CYANOCOBALAMIN) 500 MCG tablet Take 500 mcg by mouth daily.      Historical Provider, MD  warfarin (COUMADIN) 10 MG tablet One tablet Mon, Wed, Fri 10/28/13   Elayne Snare, MD  warfarin (COUMADIN) 5 MG tablet Take 2.5 mg by mouth daily. 12.5mg  Tues,Thurs, Sat, Sun    Historical Provider, MD  warfarin (COUMADIN) 5 MG tablet TAKE 2 TABLETS ONCE DAILY (THIS IS TO REPLACE THE 10 MG TABLET) 08/12/14   Elayne Snare, MD   BP 119/77 mmHg  Pulse 62  Temp(Src) 97.4 F (36.3 C) (Oral)  Resp 12  SpO2 98% Physical Exam  Constitutional: She is oriented to person, place, and time. Vital signs are normal. She appears  well-developed and well-nourished. No distress.  HENT:  Head: Normocephalic and atraumatic.  Cardiovascular:  Pulses:      Radial pulses are 2+ on the right side, and 2+ on the left side.  Pulmonary/Chest: Effort normal. No respiratory distress.  Musculoskeletal:  Left wrist: She exhibits tenderness (tenderness to palpation along the distal 10 cm of the lateral without erythema or swelling).       Left hand: Normal.  Neurological: She is alert and oriented to person, place, and time. She has normal strength. Coordination normal.  Skin: Skin is warm and dry. No rash noted. She is not diaphoretic.  Psychiatric: She has a normal mood and affect. Judgment normal.  Nursing note and vitals reviewed.   ED Course  Procedures (including critical care time) Labs Review Labs Reviewed - No data to display  Imaging Review Dg Wrist Complete Left  12/01/2014   CLINICAL DATA:  Injury, left wrist pain.  EXAM: LEFT WRIST - COMPLETE 3+ VIEW  COMPARISON:  08/03/2006  FINDINGS: No acute bony abnormality. Specifically, no fracture, subluxation, or dislocation. Soft tissues are intact.  IMPRESSION: No acute bony abnormality.   Electronically Signed   By: Rolm Baptise M.D.   On: 12/01/2014 14:02     MDM   1. Wrist pain, left    X-ray is negative. Treat what is probably a mild sprain with a wrist brace, ice, and follow-up with Ortho if no improvement in a week       Liam Graham, PA-C 12/01/14 1444

## 2014-12-01 NOTE — Discharge Instructions (Signed)
Cryotherapy °Cryotherapy means treatment with cold. Ice or gel packs can be used to reduce both pain and swelling. Ice is the most helpful within the first 24 to 48 hours after an injury or flare-up from overusing a muscle or joint. Sprains, strains, spasms, burning pain, shooting pain, and aches can all be eased with ice. Ice can also be used when recovering from surgery. Ice is effective, has very few side effects, and is safe for most people to use. °PRECAUTIONS  °Ice is not a safe treatment option for people with: °· Raynaud phenomenon. This is a condition affecting small blood vessels in the extremities. Exposure to cold may cause your problems to return. °· Cold hypersensitivity. There are many forms of cold hypersensitivity, including: °¨ Cold urticaria. Red, itchy hives appear on the skin when the tissues begin to warm after being iced. °¨ Cold erythema. This is a red, itchy rash caused by exposure to cold. °¨ Cold hemoglobinuria. Red blood cells break down when the tissues begin to warm after being iced. The hemoglobin that carry oxygen are passed into the urine because they cannot combine with blood proteins fast enough. °· Numbness or altered sensitivity in the area being iced. °If you have any of the following conditions, do not use ice until you have discussed cryotherapy with your caregiver: °· Heart conditions, such as arrhythmia, angina, or chronic heart disease. °· High blood pressure. °· Healing wounds or open skin in the area being iced. °· Current infections. °· Rheumatoid arthritis. °· Poor circulation. °· Diabetes. °Ice slows the blood flow in the region it is applied. This is beneficial when trying to stop inflamed tissues from spreading irritating chemicals to surrounding tissues. However, if you expose your skin to cold temperatures for too long or without the proper protection, you can damage your skin or nerves. Watch for signs of skin damage due to cold. °HOME CARE INSTRUCTIONS °Follow  these tips to use ice and cold packs safely. °· Place a dry or damp towel between the ice and skin. A damp towel will cool the skin more quickly, so you may need to shorten the time that the ice is used. °· For a more rapid response, add gentle compression to the ice. °· Ice for no more than 10 to 20 minutes at a time. The bonier the area you are icing, the less time it will take to get the benefits of ice. °· Check your skin after 5 minutes to make sure there are no signs of a poor response to cold or skin damage. °· Rest 20 minutes or more between uses. °· Once your skin is numb, you can end your treatment. You can test numbness by very lightly touching your skin. The touch should be so light that you do not see the skin dimple from the pressure of your fingertip. When using ice, most people will feel these normal sensations in this order: cold, burning, aching, and numbness. °· Do not use ice on someone who cannot communicate their responses to pain, such as small children or people with dementia. °HOW TO MAKE AN ICE PACK °Ice packs are the most common way to use ice therapy. Other methods include ice massage, ice baths, and cryosprays. Muscle creams that cause a cold, tingly feeling do not offer the same benefits that ice offers and should not be used as a substitute unless recommended by your caregiver. °To make an ice pack, do one of the following: °· Place crushed ice or a   bag of frozen vegetables in a sealable plastic bag. Squeeze out the excess air. Place this bag inside another plastic bag. Slide the bag into a pillowcase or place a damp towel between your skin and the bag.  Mix 3 parts water with 1 part rubbing alcohol. Freeze the mixture in a sealable plastic bag. When you remove the mixture from the freezer, it will be slushy. Squeeze out the excess air. Place this bag inside another plastic bag. Slide the bag into a pillowcase or place a damp towel between your skin and the bag. SEEK MEDICAL CARE  IF:  You develop white spots on your skin. This may give the skin a blotchy (mottled) appearance.  Your skin turns blue or pale.  Your skin becomes waxy or hard.  Your swelling gets worse. MAKE SURE YOU:   Understand these instructions.  Will watch your condition.  Will get help right away if you are not doing well or get worse. Document Released: 03/13/2011 Document Revised: 12/01/2013 Document Reviewed: 03/13/2011 [facility] Patient Information 2015 [place], [place]. This information is not intended to replace advice given to you by your health care provider. Make sure you discuss any questions you have with your health care provider.  Wrist Pain Wrist injuries are frequent in adults and children. A sprain is an injury to the ligaments that hold your bones together. A strain is an injury to muscle or muscle cord-like structures (tendons) from stretching or pulling. Generally, when wrists are moderately tender to touch following a fall or injury, a break in the bone (fracture) may be present. Most wrist sprains or strains are better in 3 to 5 days, but complete healing may take several weeks. HOME CARE INSTRUCTIONS   Put ice on the injured area.  Put ice in a plastic bag.  Place a towel between your skin and the bag.  Leave the ice on for 15-20 minutes, 3-4 times a day, for the first 2 days, or as directed by your health care provider.  Keep your arm raised above the level of your heart whenever possible to reduce swelling and pain.  Rest the injured area for at least 48 hours or as directed by your health care provider.  If a splint or elastic bandage has been applied, use it for as long as directed by your health care provider or until seen by a health care provider for a follow-up exam.  Only take over-the-counter or prescription medicines for pain, discomfort, or fever as directed by your health care provider.  Keep all follow-up appointments. You may need to follow up with a  specialist or have follow-up X-rays. Improvement in pain level is not a guarantee that you did not fracture a bone in your wrist. The only way to determine whether or not you have a broken bone is by X-ray. SEEK IMMEDIATE MEDICAL CARE IF:   Your fingers are swollen, very red, white, or cold and blue.  Your fingers are numb or tingling.  You have increasing pain.  You have difficulty moving your fingers. MAKE SURE YOU:   Understand these instructions.  Will watch your condition.  Will get help right away if you are not doing well or get worse. Document Released: 04/26/2005 Document Revised: 07/22/2013 Document Reviewed: 09/07/2010 [facility] Patient Information 2015 [place], [place]. This information is not intended to replace advice given to you by your health care provider. Make sure you discuss any questions you have with your health care provider.

## 2014-12-01 NOTE — ED Notes (Signed)
Patient c/o left wrist pain after some heavy lifting yesterday. Patient reports that she soaked in epsom salt and alcohol and rubbed Bengay on it which helped her sleep. Patient is in NAD.

## 2014-12-02 ENCOUNTER — Other Ambulatory Visit: Payer: Self-pay | Admitting: *Deleted

## 2014-12-02 MED ORDER — METHOCARBAMOL 500 MG PO TABS: 500.0000 mg | ORAL_TABLET | Freq: Two times a day (BID) | ORAL | Status: DC

## 2014-12-04 ENCOUNTER — Ambulatory Visit (INDEPENDENT_AMBULATORY_CARE_PROVIDER_SITE_OTHER): Payer: Medicare Other | Admitting: Endocrinology

## 2014-12-04 ENCOUNTER — Encounter: Payer: Self-pay | Admitting: Endocrinology

## 2014-12-04 VITALS — BP 134/82 | HR 63 | Temp 98.3°F | Resp 16 | Ht 69.0 in | Wt 240.2 lb

## 2014-12-04 DIAGNOSIS — E669 Obesity, unspecified: Secondary | ICD-10-CM

## 2014-12-04 DIAGNOSIS — E1142 Type 2 diabetes mellitus with diabetic polyneuropathy: Secondary | ICD-10-CM

## 2014-12-04 DIAGNOSIS — E538 Deficiency of other specified B group vitamins: Secondary | ICD-10-CM

## 2014-12-04 DIAGNOSIS — E039 Hypothyroidism, unspecified: Secondary | ICD-10-CM | POA: Diagnosis not present

## 2014-12-04 DIAGNOSIS — E1169 Type 2 diabetes mellitus with other specified complication: Secondary | ICD-10-CM

## 2014-12-04 DIAGNOSIS — R600 Localized edema: Secondary | ICD-10-CM

## 2014-12-04 DIAGNOSIS — G629 Polyneuropathy, unspecified: Secondary | ICD-10-CM

## 2014-12-04 DIAGNOSIS — E119 Type 2 diabetes mellitus without complications: Secondary | ICD-10-CM

## 2014-12-04 NOTE — Patient Instructions (Signed)
Please check blood sugars at least half the time about 2 hours after any meal and 3 times per week on waking up.   Please bring blood sugar monitor to each visit. Recommended blood sugar levels about 2 hours after meal is 120-150 and on waking up 90-120

## 2014-12-04 NOTE — Progress Notes (Signed)
Patient ID: Kelli Martinez, female   DOB: [birth year], 69 y.o.   MRN: [id number]    Chief complaint: Followup of various issues  History of Present Illness:  The patient is seen for the following problems:  1. Postprandial hypoglycemia related to dumping syndrome  She has had postprandial hypoglycemia starting a couple of years after her gastric bypass surgery Her symptoms of blood sugars are mostly a feeling of significant weakness, some shakiness that she usually is eating fruit to relieve the symptoms She has been treated with acarbose  With some improvement. She did try verapamil empirically but this did not help.  She is taking her Precose usually right before eating She  Was given 2 tablets at breakfast and 1 at lunch and dinner    Recent history: she has had less episodes of low blood sugars, only if late for meals and mostly before lunch She does not take her Precose now before lunch  did not bring her monitor for download today For breakfast she will have bacon, eggs and a fruit in the morning usually  She usually has a protein at lunch and dinner  She has been to the dietitian and has been instructed on balanced low-fat meals with enough protein consistently and restricting carbohydrates including fruits.  She thinks she is following the diet especially getting protein, occasionally has a very light breakfast with fruit only  Wt Readings from Last 3 Encounters:  12/04/14 240 lb 3.2 oz (108.954 kg)  10/28/14 233 lb 11.2 oz (106.006 kg)  10/05/14 233 lb 9.6 oz (105.96 kg)   2.  History of diabetes:   Usually has a normal A1c readings with slightly higher level Of 6.6 recently  Has not checked her blood sugars very often and usually not after meals.  Did not bring her monitor for download .  Currently on Precose  As above She is fairly active when she is feeling good but having more problems with her feet lately ; also has had some steroid injection in her  feet  Meal portion size is usually well controlled but she has gained weight    Lab Results  Component Value Date   HGBA1C 6.6* 09/30/2014   HGBA1C 6.5 07/01/2014   HGBA1C 6.5 01/23/2014   Lab Results  Component Value Date   MICROALBUR <0.7 12/01/2014   LDLCALC 85 07/01/2014   CREATININE 0.79 12/01/2014      Other active problems: see review of systems      Medication List       This list is accurate as of: 12/04/14  8:57 AM.  Always use your most recent med list.               acarbose 25 MG tablet  Commonly known as:  PRECOSE  TAKE 1 TABLET THREE TIMES A DAY     albuterol 108 (90 BASE) MCG/ACT inhaler  Commonly known as:  PROVENTIL HFA;VENTOLIN HFA  Inhale 2 puffs into the lungs 4 (four) times daily as needed. As needed rescue inhaler     amoxicillin 500 MG capsule  Commonly known as:  AMOXIL  Take 1 capsule (500 mg total) by mouth 3 (three) times daily.     atorvastatin 20 MG tablet  Commonly known as:  LIPITOR  Take 10 mg every other day     ergocalciferol 50000 UNITS capsule  Commonly known as:  VITAMIN D2  Take 1 capsule (50,000 Units total) by mouth every Friday.  ferrous sulfate 325 (65 FE) MG tablet  Take 325 mg by mouth daily.     fluticasone 50 MCG/ACT nasal spray  Commonly known as:  FLONASE  Place 2 sprays into both nostrils daily.     Fluticasone-Salmeterol 100-50 MCG/DOSE Aepb  Commonly known as:  ADVAIR  Inhale 1 puff into the lungs every 12 (twelve) hours. Rinse mouth     furosemide 40 MG tablet  Commonly known as:  LASIX  TAKE 1 TABLET EVERY MORNING     gabapentin 300 MG capsule  Commonly known as:  NEURONTIN  TAKE 1 CAPSULE FOUR TIMES A DAY     HYDROcodone-acetaminophen 5-325 MG per tablet  Commonly known as:  NORCO  Take 1 tablet by mouth 2 (two) times daily as needed for moderate pain.     KLOR-CON M20 20 MEQ tablet  Generic drug:  potassium chloride SA  TAKE 1 TABLET DAILY     levothyroxine 125 MCG tablet  Commonly  known as:  SYNTHROID, LEVOTHROID  TAKE 1 TABLET DAILY BEFORE BREAKFAST     magnesium chloride 64 MG Tbec SR tablet  Commonly known as:  SLOW-MAG  Take 1 tablet (64 mg total) by mouth 2 (two) times daily.     methocarbamol 500 MG tablet  Commonly known as:  ROBAXIN  Take 1 tablet (500 mg total) by mouth 2 (two) times daily.     methylcellulose 1 % ophthalmic solution  Commonly known as:  ARTIFICIAL TEARS  Place 1 drop into both eyes as needed. Dry eyes     NON FORMULARY  CPAP 6 [facility]     omeprazole 20 MG capsule  Commonly known as:  PRILOSEC  TAKE 1 CAPSULE DAILY     spironolactone 50 MG tablet  Commonly known as:  ALDACTONE  TAKE 1 TABLET DAILY     temazepam 15 MG capsule  Commonly known as:  RESTORIL  1-2 for sleep as needed     triamcinolone cream 0.1 %  Commonly known as:  KENALOG  Apply 1 application topically 2 (two) times daily.     vitamin B-12 500 MCG tablet  Commonly known as:  CYANOCOBALAMIN  Take 500 mcg by mouth daily.     warfarin 5 MG tablet  Commonly known as:  COUMADIN  Take 2.5 mg by mouth daily. 12.5mg  Tues,Thurs, Sat, Sun     warfarin 10 MG tablet  Commonly known as:  COUMADIN  One tablet Mon, Wed, Fri     warfarin 5 MG tablet  Commonly known as:  COUMADIN  TAKE 2 TABLETS ONCE DAILY (THIS IS TO REPLACE THE 10 MG TABLET)        Allergies:  Allergies  Allergen Reactions  . Adhesive [Tape] Rash    Past Medical History  Diagnosis Date  . Allergy   . Asthma   . Pulmonary embolism 1998 / 1994 /1968  . HX: breast cancer   . Blind right eye     hemorrhage  . Hypothyroid   . Clotting disorder   . Hypertension   . Blood transfusion   . Morbid obesity   . Syncope and collapse 2011    due to anemia  . GERD (gastroesophageal reflux disease)   . Hyperlipidemia   . Osteoporosis   . Psoriasis   . Gallstones   . Anemia   . Elevated liver enzymes   . Diabetes mellitus     hypoglycemia since large wt loss  . OSA (obstructive sleep  apnea)  uses c pap  . Arthritis   . Cancer     breast 1994  . Lymphedema of arm     RT  . Back pain, chronic     GETS INJECTIONS IN BACK    Past Surgical History  Procedure Laterality Date  . Abdominal hysterectomy    . Mastectomy      Right  . Gastric bypass  2011    bariatric surgery  . Ivc filter      recurrent DVT  . Bone marrow transplant  1994  . Cardiovascular stress test  05/23/2005    EF 53%  . US echocardiography  10/27/08    EF 55-60%  . Knee arthrotomy  1998  . Carpal tunnell      bil  . Cataracts    . Hiatal hernia repair    . Cholecystectomy  05/10/2012    Procedure: LAPAROSCOPIC CHOLECYSTECTOMY WITH INTRAOPERATIVE CHOLANGIOGRAM;  Surgeon: Pedro Earls, MD;  Location: [facility];  Service: General;  Laterality: N/A;  Laparoscopic Cholecystectomy with Intraoperative Cholangiogram    Family History  Problem Relation Age of Onset  . Sudden death Mother     car accident  . Cancer Father 41    lung  . Cancer Sister 35    lung cancer  . Asthma Daughter     Social History:  reports that she quit smoking about 36 years ago. Her smoking use included Cigarettes. She has a 5 pack-year smoking history. She has never used smokeless tobacco. She reports that she does not drink alcohol or use illicit drugs.  Review of Systems     BURNING in both legs feet all the time, also some sharp pains.  No numbness or tingling.  She does take gabapentin as needed and is taking up to 4 or 5 a day now with relief.  She was also having some problems with pains in her feet and has had some steroid injections Also her has lumbar spinal stenosis; with occasional feeling of numbness in the left leg is going to see her orthopedic surgeon soon  EDEMA: She has had problems with  edema both in her legs and right arm She also has gained some further amount of weight Edema has occurred despite taking Aldactone 50 mg; with 100 mg she had mild hyperkalemia  She thinks that she has more  fatigue and weakness when she has edema  She has tried to take the Lasix more often as needed and swelling is a little better this morning Previously would have leg cramps with taking Lasix but she thinks this is better with drinking more water She does not use elastic stocking regularly but new prescription was given now  Lymphedema right arm, Persistent After radical  breast surgery, using  elastic compression stocking but it is worse now and she thinks that the sleeve is very guarded with holes   History of mild hypothyroidism: Adequately replaced  with 62.5 mcg , previously has had normal TSH  Lab Results  Component Value Date   TSH 0.92 12/01/2014    Hypercholesterolemia: LDL has been at target with Lipitor and she is compliant with this. Taking this also for cardiovascular prophylaxis  Lab Results  Component Value Date   CHOL 184 07/01/2014   HDL 88.40 07/01/2014   LDLCALC 85 07/01/2014   TRIG 51.0 07/01/2014   CHOLHDL 2 07/01/2014   Previous history of hypertension: Currently not on any medications except diuretics.  No recent orthostatic dizziness even with increasing  Lasix   She takes magnesium for muscle cramps  Also has some muscle aches and back pain and has relief with methocarbamol  LABS:  Lab on 12/01/2014  Component Date Value Ref Range Status  . Sodium 12/01/2014 137  135 - 145 mEq/L Final  . Potassium 12/01/2014 4.1  3.5 - 5.1 mEq/L Final  . Chloride 12/01/2014 104  96 - 112 mEq/L Final  . CO2 12/01/2014 27  19 - 32 mEq/L Final  . Glucose, Bld 12/01/2014 95  70 - 99 mg/dL Final  . BUN 12/01/2014 13  6 - 23 mg/dL Final  . Creatinine, Ser 12/01/2014 0.79  0.40 - 1.20 mg/dL Final  . Total Bilirubin 12/01/2014 0.4  0.2 - 1.2 mg/dL Final  . Alkaline Phosphatase 12/01/2014 77  39 - 117 U/L Final  . AST 12/01/2014 31  0 - 37 U/L Final  . ALT 12/01/2014 35  0 - 35 U/L Final  . Total Protein 12/01/2014 6.7  6.0 - 8.3 g/dL Final  . Albumin 12/01/2014 3.6  3.5 -  5.2 g/dL Final  . Calcium 12/01/2014 9.1  8.4 - 10.5 mg/dL Final  . GFR 12/01/2014 93.36  >60.00 mL/min Final  . TSH 12/01/2014 0.92  0.35 - 4.50 uIU/mL Final  . Free T4 12/01/2014 0.89  0.60 - 1.60 ng/dL Final  . Color, Urine 12/01/2014 YELLOW  Yellow;Lt. Yellow Final  . APPearance 12/01/2014 CLEAR  Clear Final  . Specific Gravity, Urine 12/01/2014 1.010  1.000-1.030 Final  . pH 12/01/2014 7.0  5.0 - 8.0 Final  . Total Protein, Urine 12/01/2014 NEGATIVE  Negative Final  . Urine Glucose 12/01/2014 NEGATIVE  Negative Final  . Ketones, ur 12/01/2014 NEGATIVE  Negative Final  . Bilirubin Urine 12/01/2014 NEGATIVE  Negative Final  . Hgb urine dipstick 12/01/2014 NEGATIVE  Negative Final  . Urobilinogen, UA 12/01/2014 1.0  0.0 - 1.0 Final  . Leukocytes, UA 12/01/2014 NEGATIVE  Negative Final  . Nitrite 12/01/2014 NEGATIVE  Negative Final  . WBC, UA 12/01/2014 0-2/hpf  0-2/hpf Final  . RBC / HPF 12/01/2014 none seen  0-2/hpf Final  . Squamous Epithelial / LPF 12/01/2014 Rare(0-4/hpf)  Rare(0-4/hpf) Final  . Microalb, Ur 12/01/2014 <0.7  0.0 - 1.9 mg/dL Final  . Creatinine,U 12/01/2014 103.0   Final  . Microalb Creat Ratio 12/01/2014 0.7  0.0 - 30.0 mg/g Final  Anti-coag visit on 11/18/2014  Component Date Value Ref Range Status  . INR 11/18/2014 1.9   Final     EXAM:  BP 134/82 mmHg  Pulse 63  Temp(Src) 98.3 F (36.8 C)  Resp 16  Ht 5\' 9"  (1.753 m)  Wt 240 lb 3.2 oz (108.954 kg)  BMI 35.46 kg/m2  SpO2 97%  Lower leg edema 1+ present,  More on the left with slight induration No warmth of the skin or erythema    Assessment/Plan:   1. Reactive/postprandial hypoglycemia from dumping syndrome.  She has periodic symptoms although not as frequent or severe recently Appears to be  responding to changes in diet as well as Precose before meals especially with taking this at breakfast time  She will continue her Precose unchanged  Continue balanced meals with protein and some  snacks midmorning   2. Venous leg edema treated with Aldactone and as needed Lasix  Has some generalized edema since she is tending to gain weight Unable to increase Aldactone because of hyperkalemia  She can try to take Lasix consistently as needed Given her prescription for new knee-high  stockings which she can wear more easily Also new prescription given for upper arm sleeve on the right side for her lymphedema  3. Mild diabetes: A1c is 6.6 recently and she needs to work on her weight and more consistent exercise when she can tolerate it He also needs to check postprandial readings  4.  Minimal increase in liver functions, resolved   5.  NEUROPATHY: She is having paresthesia and not clear if this is all related to diabetes.  She will continue to take gabapentin as needed      Eusebia Grulke 12/04/2014, 8:57 AM

## 2014-12-08 DIAGNOSIS — M5441 Lumbago with sciatica, right side: Secondary | ICD-10-CM | POA: Diagnosis not present

## 2014-12-08 DIAGNOSIS — M5442 Lumbago with sciatica, left side: Secondary | ICD-10-CM | POA: Diagnosis not present

## 2014-12-10 ENCOUNTER — Telehealth: Payer: Self-pay | Admitting: General Practice

## 2014-12-10 NOTE — Telephone Encounter (Signed)
-----   Message from Deneise Lever, MD sent at 12/09/2014  3:12 PM EDT ----- Regarding: RE: Clearance for spinal injection She should be ok stopping coumadin for 5 days, then restarting on her usual schedule per your usual protocol timing after her injection.  Glynn Octave, MD  ----- Message -----    From: Warden Fillers, RN    Sent: 12/09/2014  11:10 AM      To: Deneise Lever, MD Subject: Clearance for spinal injection                 Patient is scheduled for a spinal injection and needs to come off coumadin for 5 days.  Can you give clearance this?  Also is Lovenox needed?  Thanks, Villa Herb, RN

## 2014-12-13 ENCOUNTER — Other Ambulatory Visit: Payer: Self-pay | Admitting: Endocrinology

## 2014-12-14 ENCOUNTER — Other Ambulatory Visit: Payer: Self-pay | Admitting: Endocrinology

## 2014-12-16 ENCOUNTER — Ambulatory Visit (INDEPENDENT_AMBULATORY_CARE_PROVIDER_SITE_OTHER): Payer: Medicare Other | Admitting: General Practice

## 2014-12-16 DIAGNOSIS — Z5181 Encounter for therapeutic drug level monitoring: Secondary | ICD-10-CM | POA: Diagnosis not present

## 2014-12-16 LAB — POCT INR: INR: 1.2

## 2014-12-16 NOTE — Progress Notes (Signed)
Pre visit review using our clinic review tool, if applicable. No additional management support is needed unless otherwise documented below in the visit note. 

## 2014-12-16 NOTE — Progress Notes (Signed)
Agree with plan 

## 2014-12-18 DIAGNOSIS — M5136 Other intervertebral disc degeneration, lumbar region: Secondary | ICD-10-CM | POA: Diagnosis not present

## 2014-12-18 DIAGNOSIS — M5030 Other cervical disc degeneration, unspecified cervical region: Secondary | ICD-10-CM | POA: Diagnosis not present

## 2014-12-18 DIAGNOSIS — M5032 Other cervical disc degeneration, mid-cervical region: Secondary | ICD-10-CM | POA: Diagnosis not present

## 2014-12-18 DIAGNOSIS — M47816 Spondylosis without myelopathy or radiculopathy, lumbar region: Secondary | ICD-10-CM | POA: Diagnosis not present

## 2014-12-18 DIAGNOSIS — M47812 Spondylosis without myelopathy or radiculopathy, cervical region: Secondary | ICD-10-CM | POA: Diagnosis not present

## 2014-12-29 ENCOUNTER — Telehealth: Payer: Self-pay | Admitting: Endocrinology

## 2014-12-29 ENCOUNTER — Other Ambulatory Visit: Payer: Self-pay | Admitting: *Deleted

## 2014-12-29 MED ORDER — BACLOFEN 10 MG PO TABS: 10.0000 mg | ORAL_TABLET | Freq: Three times a day (TID) | ORAL | Status: DC

## 2014-12-29 NOTE — Telephone Encounter (Signed)
Please see below and advise.

## 2014-12-29 NOTE — Telephone Encounter (Signed)
Message left on patients vm, rx sent

## 2014-12-29 NOTE — Telephone Encounter (Signed)
She can come in to get a BMP and magnesium level checked today Meanwhile she can try taking baclofen 10 mg 3 times a day as needed

## 2014-12-29 NOTE — Telephone Encounter (Signed)
Patient stated that she had cramps all over her body from the bottom of her feet to the side of her head. her hands and feet lock up on her as well, she thinks her potassium is to high or low, please advise

## 2014-12-30 ENCOUNTER — Other Ambulatory Visit (INDEPENDENT_AMBULATORY_CARE_PROVIDER_SITE_OTHER): Payer: Medicare Other

## 2014-12-30 ENCOUNTER — Other Ambulatory Visit: Payer: Self-pay | Admitting: *Deleted

## 2014-12-30 DIAGNOSIS — E538 Deficiency of other specified B group vitamins: Secondary | ICD-10-CM | POA: Diagnosis not present

## 2014-12-30 DIAGNOSIS — R202 Paresthesia of skin: Secondary | ICD-10-CM

## 2014-12-30 DIAGNOSIS — E669 Obesity, unspecified: Secondary | ICD-10-CM

## 2014-12-30 DIAGNOSIS — E1142 Type 2 diabetes mellitus with diabetic polyneuropathy: Secondary | ICD-10-CM

## 2014-12-30 DIAGNOSIS — E119 Type 2 diabetes mellitus without complications: Secondary | ICD-10-CM | POA: Diagnosis not present

## 2014-12-30 DIAGNOSIS — E1169 Type 2 diabetes mellitus with other specified complication: Secondary | ICD-10-CM

## 2014-12-30 DIAGNOSIS — G629 Polyneuropathy, unspecified: Secondary | ICD-10-CM

## 2014-12-30 LAB — COMPREHENSIVE METABOLIC PANEL
ALBUMIN: 3.9 g/dL (ref 3.5–5.2)
ALK PHOS: 76 U/L (ref 39–117)
ALT: 49 U/L — ABNORMAL HIGH (ref 0–35)
AST: 39 U/L — ABNORMAL HIGH (ref 0–37)
BUN: 13 mg/dL (ref 6–23)
CALCIUM: 9.3 mg/dL (ref 8.4–10.5)
CO2: 30 meq/L (ref 19–32)
Chloride: 103 mEq/L (ref 96–112)
Creatinine, Ser: 0.84 mg/dL (ref 0.40–1.20)
GFR: 86.96 mL/min (ref >60.00)
GLUCOSE: 96 mg/dL (ref 70–99)
Potassium: 4.2 mEq/L (ref 3.5–5.1)
Sodium: 137 mEq/L (ref 135–145)
Total Bilirubin: 0.4 mg/dL (ref 0.2–1.2)
Total Protein: 7 g/dL (ref 6.0–8.3)

## 2014-12-30 LAB — LIPID PANEL
CHOLESTEROL: 184 mg/dL (ref 0–200)
HDL: 98.6 mg/dL (ref >39.00)
LDL Cholesterol: 77 mg/dL (ref 0–99)
NONHDL: 85.4
Total CHOL/HDL Ratio: 2
Triglycerides: 43 mg/dL (ref 0.0–149.0)
VLDL: 8.6 mg/dL (ref 0.0–40.0)

## 2014-12-30 LAB — MAGNESIUM: Magnesium: 2.3 mg/dL (ref 1.5–2.5)

## 2014-12-30 LAB — VITAMIN B12: Vitamin B-12: 705 pg/mL (ref 211–911)

## 2014-12-30 LAB — HEMOGLOBIN A1C: HEMOGLOBIN A1C: 6.2 % (ref 4.6–6.5)

## 2014-12-30 NOTE — Progress Notes (Signed)
Quick Note:  Please let patient know that the lab result is normal and no further action needed ______ 

## 2015-01-01 DIAGNOSIS — M5441 Lumbago with sciatica, right side: Secondary | ICD-10-CM | POA: Diagnosis not present

## 2015-01-01 DIAGNOSIS — M5442 Lumbago with sciatica, left side: Secondary | ICD-10-CM | POA: Diagnosis not present

## 2015-01-01 DIAGNOSIS — M5136 Other intervertebral disc degeneration, lumbar region: Secondary | ICD-10-CM | POA: Diagnosis not present

## 2015-01-01 DIAGNOSIS — M5032 Other cervical disc degeneration, mid-cervical region: Secondary | ICD-10-CM | POA: Diagnosis not present

## 2015-01-03 ENCOUNTER — Other Ambulatory Visit: Payer: Self-pay | Admitting: Endocrinology

## 2015-01-13 ENCOUNTER — Ambulatory Visit (INDEPENDENT_AMBULATORY_CARE_PROVIDER_SITE_OTHER): Payer: Medicare Other | Admitting: *Deleted

## 2015-01-13 DIAGNOSIS — Z5181 Encounter for therapeutic drug level monitoring: Secondary | ICD-10-CM

## 2015-01-13 LAB — POCT INR: INR: 2.1

## 2015-01-13 NOTE — Progress Notes (Signed)
Pre visit review using our clinic review tool, if applicable. No additional management support is needed unless otherwise documented below in the visit note. 

## 2015-01-13 NOTE — Progress Notes (Signed)
I have reviewed and agree with the plan. 

## 2015-01-19 ENCOUNTER — Other Ambulatory Visit: Payer: Self-pay | Admitting: Endocrinology

## 2015-01-19 DIAGNOSIS — M2042 Other hammer toe(s) (acquired), left foot: Secondary | ICD-10-CM | POA: Diagnosis not present

## 2015-01-19 DIAGNOSIS — M2570 Osteophyte, unspecified joint: Secondary | ICD-10-CM | POA: Diagnosis not present

## 2015-01-19 DIAGNOSIS — M79609 Pain in unspecified limb: Secondary | ICD-10-CM | POA: Diagnosis not present

## 2015-01-19 DIAGNOSIS — M2041 Other hammer toe(s) (acquired), right foot: Secondary | ICD-10-CM | POA: Diagnosis not present

## 2015-01-19 DIAGNOSIS — B351 Tinea unguium: Secondary | ICD-10-CM | POA: Diagnosis not present

## 2015-01-25 ENCOUNTER — Other Ambulatory Visit: Payer: Self-pay | Admitting: Endocrinology

## 2015-01-29 DIAGNOSIS — M5442 Lumbago with sciatica, left side: Secondary | ICD-10-CM | POA: Diagnosis not present

## 2015-01-29 DIAGNOSIS — M5032 Other cervical disc degeneration, mid-cervical region: Secondary | ICD-10-CM | POA: Diagnosis not present

## 2015-01-29 DIAGNOSIS — M542 Cervicalgia: Secondary | ICD-10-CM | POA: Diagnosis not present

## 2015-01-29 DIAGNOSIS — M5441 Lumbago with sciatica, right side: Secondary | ICD-10-CM | POA: Diagnosis not present

## 2015-02-05 ENCOUNTER — Telehealth: Payer: Self-pay | Admitting: Endocrinology

## 2015-02-05 ENCOUNTER — Telehealth: Payer: Self-pay | Admitting: *Deleted

## 2015-02-05 NOTE — Telephone Encounter (Signed)
Patient is having Facet injections in her back on the 07/15, [employer] says she needs to stop her warfarin for 5 days prior to this.  They want to make sure it's okay with you for her to stop it. Please advise.

## 2015-02-05 NOTE — Telephone Encounter (Signed)
Noted, they are aware. 

## 2015-02-05 NOTE — Telephone Encounter (Signed)
She needs to confirm with oncologist

## 2015-02-05 NOTE — Telephone Encounter (Signed)
call Deanna with [facility] orthopedictics @ [phone] ext [phone] regarding medical clearance

## 2015-02-08 ENCOUNTER — Telehealth: Payer: Self-pay | Admitting: General Practice

## 2015-02-08 NOTE — Telephone Encounter (Signed)
Faxed orders to stop coumadin for 5 days for procedure.

## 2015-02-10 ENCOUNTER — Ambulatory Visit: Payer: Medicare Other

## 2015-02-12 DIAGNOSIS — M5136 Other intervertebral disc degeneration, lumbar region: Secondary | ICD-10-CM | POA: Diagnosis not present

## 2015-02-12 DIAGNOSIS — M47816 Spondylosis without myelopathy or radiculopathy, lumbar region: Secondary | ICD-10-CM | POA: Diagnosis not present

## 2015-02-20 ENCOUNTER — Encounter (HOSPITAL_COMMUNITY): Payer: Self-pay | Admitting: Nurse Practitioner

## 2015-02-20 ENCOUNTER — Emergency Department (HOSPITAL_COMMUNITY)
Admission: EM | Admit: 2015-02-20 | Discharge: 2015-02-20 | Disposition: A | Discharge status: Home / Self Care | Payer: Medicare Other | Attending: Emergency Medicine | Admitting: Emergency Medicine

## 2015-02-20 DIAGNOSIS — D649 Anemia, unspecified: Secondary | ICD-10-CM | POA: Insufficient documentation

## 2015-02-20 DIAGNOSIS — J45909 Unspecified asthma, uncomplicated: Secondary | ICD-10-CM | POA: Insufficient documentation

## 2015-02-20 DIAGNOSIS — M8000XA Age-related osteoporosis with current pathological fracture, unspecified site, initial encounter for fracture: Secondary | ICD-10-CM | POA: Insufficient documentation

## 2015-02-20 DIAGNOSIS — R5383 Other fatigue: Secondary | ICD-10-CM | POA: Diagnosis not present

## 2015-02-20 DIAGNOSIS — Z853 Personal history of malignant neoplasm of breast: Secondary | ICD-10-CM | POA: Diagnosis not present

## 2015-02-20 DIAGNOSIS — E039 Hypothyroidism, unspecified: Secondary | ICD-10-CM | POA: Diagnosis not present

## 2015-02-20 DIAGNOSIS — Z7901 Long term (current) use of anticoagulants: Secondary | ICD-10-CM | POA: Insufficient documentation

## 2015-02-20 DIAGNOSIS — E11649 Type 2 diabetes mellitus with hypoglycemia without coma: Secondary | ICD-10-CM | POA: Insufficient documentation

## 2015-02-20 DIAGNOSIS — Z79899 Other long term (current) drug therapy: Secondary | ICD-10-CM | POA: Insufficient documentation

## 2015-02-20 DIAGNOSIS — Z7952 Long term (current) use of systemic steroids: Secondary | ICD-10-CM | POA: Diagnosis not present

## 2015-02-20 DIAGNOSIS — Z7951 Long term (current) use of inhaled steroids: Secondary | ICD-10-CM | POA: Insufficient documentation

## 2015-02-20 DIAGNOSIS — M1990 Unspecified osteoarthritis, unspecified site: Secondary | ICD-10-CM | POA: Diagnosis not present

## 2015-02-20 DIAGNOSIS — Z87891 Personal history of nicotine dependence: Secondary | ICD-10-CM | POA: Insufficient documentation

## 2015-02-20 DIAGNOSIS — H5441 Blindness, right eye, normal vision left eye: Secondary | ICD-10-CM | POA: Insufficient documentation

## 2015-02-20 DIAGNOSIS — E785 Hyperlipidemia, unspecified: Secondary | ICD-10-CM | POA: Insufficient documentation

## 2015-02-20 DIAGNOSIS — G4733 Obstructive sleep apnea (adult) (pediatric): Secondary | ICD-10-CM | POA: Insufficient documentation

## 2015-02-20 DIAGNOSIS — M7989 Other specified soft tissue disorders: Secondary | ICD-10-CM

## 2015-02-20 DIAGNOSIS — I10 Essential (primary) hypertension: Secondary | ICD-10-CM | POA: Insufficient documentation

## 2015-02-20 DIAGNOSIS — K219 Gastro-esophageal reflux disease without esophagitis: Secondary | ICD-10-CM | POA: Insufficient documentation

## 2015-02-20 DIAGNOSIS — R2242 Localized swelling, mass and lump, left lower limb: Secondary | ICD-10-CM | POA: Diagnosis not present

## 2015-02-20 DIAGNOSIS — M25572 Pain in left ankle and joints of left foot: Secondary | ICD-10-CM | POA: Diagnosis not present

## 2015-02-20 DIAGNOSIS — G8929 Other chronic pain: Secondary | ICD-10-CM | POA: Insufficient documentation

## 2015-02-20 DIAGNOSIS — M25562 Pain in left knee: Secondary | ICD-10-CM | POA: Insufficient documentation

## 2015-02-20 DIAGNOSIS — L03116 Cellulitis of left lower limb: Secondary | ICD-10-CM | POA: Insufficient documentation

## 2015-02-20 DIAGNOSIS — M79605 Pain in left leg: Secondary | ICD-10-CM | POA: Diagnosis present

## 2015-02-20 DIAGNOSIS — Z86711 Personal history of pulmonary embolism: Secondary | ICD-10-CM | POA: Diagnosis not present

## 2015-02-20 LAB — PROTIME-INR
INR: 2 — ABNORMAL HIGH (ref 0.00–1.49)
Prothrombin Time: 22.6 seconds — ABNORMAL HIGH (ref 11.6–15.2)

## 2015-02-20 LAB — CBC WITH DIFFERENTIAL/PLATELET
BASOS PCT: 1 % (ref 0–1)
Basophils Absolute: 0 10*3/uL (ref 0.0–0.1)
Eosinophils Absolute: 0.1 10*3/uL (ref 0.0–0.7)
Eosinophils Relative: 1 % (ref 0–5)
HEMATOCRIT: 38.7 % (ref 36.0–46.0)
Hemoglobin: 12.5 g/dL (ref 12.0–15.0)
LYMPHS PCT: 33 % (ref 12–46)
Lymphs Abs: 2.2 10*3/uL (ref 0.7–4.0)
MCH: 28.9 pg (ref 26.0–34.0)
MCHC: 32.3 g/dL (ref 30.0–36.0)
MCV: 89.6 fL (ref 78.0–100.0)
MONO ABS: 0.4 10*3/uL (ref 0.1–1.0)
Monocytes Relative: 6 % (ref 3–12)
NEUTROS ABS: 3.9 10*3/uL (ref 1.7–7.7)
Neutrophils Relative %: 59 % (ref 43–77)
Platelets: 146 10*3/uL — ABNORMAL LOW (ref 150–400)
RBC: 4.32 MIL/uL (ref 3.87–5.11)
RDW: 14 % (ref 11.5–15.5)
WBC: 6.6 10*3/uL (ref 4.0–10.5)

## 2015-02-20 LAB — BASIC METABOLIC PANEL
Anion gap: 9 (ref 5–15)
BUN: 18 mg/dL (ref 6–20)
CHLORIDE: 103 mmol/L (ref 101–111)
CO2: 22 mmol/L (ref 22–32)
Calcium: 8.7 mg/dL — ABNORMAL LOW (ref 8.9–10.3)
Creatinine, Ser: 0.75 mg/dL (ref 0.44–1.00)
GFR calc Af Amer: >60 mL/min (ref >60)
GFR calc non Af Amer: >60 mL/min (ref >60)
Glucose, Bld: 111 mg/dL — ABNORMAL HIGH (ref 65–99)
POTASSIUM: 4.2 mmol/L (ref 3.5–5.1)
Sodium: 134 mmol/L — ABNORMAL LOW (ref 135–145)

## 2015-02-20 MED ORDER — CLINDAMYCIN HCL 150 MG PO CAPS: 300.0000 mg | ORAL_CAPSULE | Freq: Three times a day (TID) | ORAL | Status: DC

## 2015-02-20 MED ORDER — CLINDAMYCIN HCL 300 MG PO CAPS
300.0000 mg | ORAL_CAPSULE | Freq: Once | ORAL | Status: AC
  Administered 2015-02-20: 300 mg via ORAL
  Filled 2015-02-20: qty 1

## 2015-02-20 NOTE — ED Notes (Signed)
Pt c/o onset LLE pain Tuesday, started in her knee and has now moved down to L posterior calf/ankle. Mild redness and swelling noted. She applied heat with some relief. She is concerned because shes had a history of DVT.

## 2015-02-20 NOTE — ED Provider Notes (Signed)
CSN: [id number]     Arrival date & time 02/20/15  1528 History   First MD Initiated Contact with Patient 02/20/15 2009     Chief Complaint  Patient presents with  . Leg Pain     (Consider location/radiation/quality/duration/timing/severity/associated sxs/prior Treatment) HPI Comments: Kelli Martinez, 69 y/o female with history of blood clots, PE, breast cancer, and asthma presents with LLE pain. Two weeks ago she had a pain block in her back and was temporarily off warfarin. She restarted warfarin on one week ago. The pain started in her knee about 4 days ago and has moved down her knee through to her ankle. She describes it as swollen, tender, and red. Last night the pain was so bad that it woke her from her sleep. She has relief with heating pads, rest, and elevation. She has tried bengay, prescription pain medications, and muscle relaxers without relief. She has had a cough, but states it is not new onset. She denies SOB, blood in sputum, fevers, cuts or insect bites.   Patient is a 69 y.o. female presenting with leg pain. The history is provided by the patient.  Leg Pain Location:  Leg, ankle and knee Time since incident:  4 days Leg location:  L lower leg Knee location:  L knee Ankle location:  L ankle Pain details:    Quality: tender.   Onset quality:  Gradual   Timing:  Intermittent   Progression:  Worsening Chronicity:  New Foreign body present:  No foreign bodies Relieved by:  Heat, rest and elevation Ineffective treatments:  Muscle relaxant Associated symptoms: fatigue and swelling     Past Medical History  Diagnosis Date  . Allergy   . Asthma   . Pulmonary embolism 1998 / 1994 /1968  . HX: breast cancer   . Blind right eye     hemorrhage  . Hypothyroid   . Clotting disorder   . Hypertension   . Blood transfusion   . Morbid obesity   . Syncope and collapse 2011    due to anemia  . GERD (gastroesophageal reflux disease)   . Hyperlipidemia   . Osteoporosis    . Psoriasis   . Gallstones   . Anemia   . Elevated liver enzymes   . Diabetes mellitus     hypoglycemia since large wt loss  . OSA (obstructive sleep apnea)     uses c pap  . Arthritis   . Cancer     breast 1994  . Lymphedema of arm     RT  . Back pain, chronic     GETS INJECTIONS IN BACK   Past Surgical History  Procedure Laterality Date  . Abdominal hysterectomy    . Mastectomy      Right  . Gastric bypass  2011    bariatric surgery  . Ivc filter      recurrent DVT  . Bone marrow transplant  1994  . Cardiovascular stress test  05/23/2005    EF 53%  . US echocardiography  10/27/08    EF 55-60%  . Knee arthrotomy  1998  . Carpal tunnell      bil  . Cataracts    . Hiatal hernia repair    . Cholecystectomy  05/10/2012    Procedure: LAPAROSCOPIC CHOLECYSTECTOMY WITH INTRAOPERATIVE CHOLANGIOGRAM;  Surgeon: Pedro Earls, MD;  Location: [facility];  Service: General;  Laterality: N/A;  Laparoscopic Cholecystectomy with Intraoperative Cholangiogram   Family History  Problem Relation Age of Onset  .  Sudden death Mother     car accident  . Cancer Father 45    lung  . Cancer Sister 77    lung cancer  . Asthma Daughter    History  Substance Use Topics  . Smoking status: Former Smoker -- 0.50 packs/day for 10 years    Types: Cigarettes    Quit date: 07/31/1978  . Smokeless tobacco: Never Used  . Alcohol Use: No   OB History    No data available     Review of Systems  Constitutional: Positive for fatigue.  Musculoskeletal: Positive for myalgias and arthralgias.  All other systems reviewed and are negative.     Allergies  Adhesive  Home Medications   Prior to Admission medications   Medication Sig Start Date End Date Taking? Authorizing Provider  acarbose (PRECOSE) 25 MG tablet TAKE 1 TABLET THREE TIMES A DAY 09/30/14   Elayne Snare, MD  albuterol (PROVENTIL HFA;VENTOLIN HFA) 108 (90 BASE) MCG/ACT inhaler Inhale 2 puffs into the lungs 4 (four) times daily as  needed. As needed rescue inhaler 04/21/14 04/21/15  Deneise Lever, MD  amoxicillin (AMOXIL) 500 MG capsule Take 1 capsule (500 mg total) by mouth 3 (three) times daily. Patient not taking: Reported on 12/04/2014 10/07/14   Deneise Lever, MD  atorvastatin (LIPITOR) 20 MG tablet Take 10 mg every other day 11/13/13   Elayne Snare, MD  atorvastatin (LIPITOR) 20 MG tablet TAKE 1 TABLET DAILY 12/14/14   Elayne Snare, MD  baclofen (LIORESAL) 10 MG tablet Take 1 tablet (10 mg total) by mouth 3 (three) times daily. 12/29/14   Elayne Snare, MD  ergocalciferol (VITAMIN D2) 50000 UNITS capsule Take 1 capsule (50,000 Units total) by mouth every Friday. 07/29/14   Elayne Snare, MD  ferrous sulfate 325 (65 FE) MG tablet Take 325 mg by mouth daily.      Historical Provider, MD  fluticasone (FLONASE) 50 MCG/ACT nasal spray Place 2 sprays into both nostrils daily. 04/21/14 08/02/15  Deneise Lever, MD  Fluticasone-Salmeterol (ADVAIR) 100-50 MCG/DOSE AEPB Inhale 1 puff into the lungs every 12 (twelve) hours. Rinse mouth 04/21/14 04/21/15  Deneise Lever, MD  furosemide (LASIX) 40 MG tablet TAKE 1 TABLET EVERY MORNING 01/04/15   Elayne Snare, MD  gabapentin (NEURONTIN) 300 MG capsule TAKE 1 CAPSULE FOUR TIMES A DAY 12/14/14   Elayne Snare, MD  HYDROcodone-acetaminophen (NORCO) 5-325 MG per tablet Take 1 tablet by mouth 2 (two) times daily as needed for moderate pain. 03/30/14   Elayne Snare, MD  KLOR-CON M20 20 MEQ tablet TAKE 1 TABLET DAILY 01/25/15   Elayne Snare, MD  levothyroxine (SYNTHROID, LEVOTHROID) 125 MCG tablet TAKE 1 TABLET DAILY BEFORE BREAKFAST 11/25/14   Elayne Snare, MD  magnesium chloride (SLOW-MAG) 64 MG TBEC SR tablet Take 1 tablet (64 mg total) by mouth 2 (two) times daily. 07/21/13   Elayne Snare, MD  methocarbamol (ROBAXIN) 500 MG tablet Take 1 tablet (500 mg total) by mouth 2 (two) times daily. 12/02/14   Elayne Snare, MD  methylcellulose (ARTIFICIAL TEARS) 1 % ophthalmic solution Place 1 drop into both eyes as needed. Dry eyes     Historical Provider, MD  NON FORMULARY CPAP 6 [place] Provider, MD  omeprazole (PRILOSEC) 20 MG capsule TAKE 1 CAPSULE DAILY 01/25/15   Elayne Snare, MD  spironolactone (ALDACTONE) 50 MG tablet TAKE 1 TABLET DAILY 09/30/14   Elayne Snare, MD  temazepam (RESTORIL) 15 MG capsule 1-2 for sleep as needed  04/21/13   Deneise Lever, MD  triamcinolone cream (KENALOG) 0.1 % Apply 1 application topically 2 (two) times daily. 01/28/14   Elayne Snare, MD  vitamin B-12 (CYANOCOBALAMIN) 500 MCG tablet Take 500 mcg by mouth daily.      Historical Provider, MD  warfarin (COUMADIN) 10 MG tablet One tablet Mon, Wed, Fri 10/28/13   Elayne Snare, MD  warfarin (COUMADIN) 5 MG tablet Take 2.5 mg by mouth daily. 12.5mg  Tues,Thurs, Sat, Sun    Historical Provider, MD  warfarin (COUMADIN) 5 MG tablet TAKE 2 TABLETS ONCE DAILY (THIS IS TO REPLACE THE 10 MG TABLET) 01/19/15   Elayne Snare, MD   BP 121/66 mmHg  Pulse 57  Temp(Src) 99.5 F (37.5 C) (Oral)  Resp 18  SpO2 100% Physical Exam  Constitutional: She is oriented to person, place, and time. She appears well-developed and well-nourished. No distress.  HENT:  Head: Normocephalic and atraumatic.  Eyes: Conjunctivae and EOM are normal.  Neck: Normal range of motion. Neck supple.  Cardiovascular: Normal rate, regular rhythm, normal heart sounds and intact distal pulses.  Exam reveals no gallop and no friction rub.   No murmur heard. Pulmonary/Chest: Effort normal and breath sounds normal. She has no wheezes. She has no rales. She exhibits no tenderness.  Abdominal: Soft. There is no tenderness.  Musculoskeletal: Normal range of motion. She exhibits edema and tenderness.       Left knee: Tenderness found.       Left ankle: Tenderness.       Left upper leg: She exhibits tenderness and swelling.       Left lower leg: She exhibits tenderness, swelling and edema.  Neurological: She is alert and oriented to person, place, and time.  Speech is goal-oriented. Moves  limbs without ataxia.   Skin: Skin is warm and dry. She is not diaphoretic. There is erythema.  Psychiatric: She has a normal mood and affect. Her behavior is normal.  Nursing note and vitals reviewed.   ED Course  Procedures (including critical care time) Labs Review Labs Reviewed  CBC WITH DIFFERENTIAL/PLATELET - Abnormal; Notable for the following:    Platelets 146 (*)    All other components within normal limits  BASIC METABOLIC PANEL - Abnormal; Notable for the following:    Sodium 134 (*)    Glucose, Bld 111 (*)    Calcium 8.7 (*)    All other components within normal limits  PROTIME-INR - Abnormal; Notable for the following:    Prothrombin Time 22.6 (*)    INR 2.00 (*)    All other components within normal limits    Imaging Review No results found.   EKG Interpretation None      MDM   Final diagnoses:  Swelling of left lower extremity  Cellulitis of left lower extremity    Patient with swelling and tenderness of left lower extremity, mostly around her ankle. Labs unremarkable for acute changes. Patient's INR therapeutic at 2.0. Patient will be discharged with clindamycin and schedule for DVT study in the morning.    Alvina Chou, PA-C 02/21/15 0531  Charlesetta Shanks, MD 02/28/15 [phone]

## 2015-02-20 NOTE — Discharge Instructions (Signed)
Take Clindamycin as directed until gone. Return to [facility] tomorrow for ultrasound study to rule out DVT. Refer to attached documents for more information.

## 2015-02-21 ENCOUNTER — Other Ambulatory Visit (HOSPITAL_COMMUNITY): Payer: Self-pay | Admitting: Emergency Medicine

## 2015-02-21 ENCOUNTER — Ambulatory Visit (HOSPITAL_COMMUNITY): Admission: RE | Admit: 2015-02-21 | Payer: Medicare Other | Source: Ambulatory Visit

## 2015-02-21 DIAGNOSIS — R52 Pain, unspecified: Secondary | ICD-10-CM

## 2015-02-22 ENCOUNTER — Encounter (HOSPITAL_COMMUNITY): Payer: Medicare Other

## 2015-02-22 ENCOUNTER — Telehealth: Payer: Self-pay | Admitting: Endocrinology

## 2015-02-22 ENCOUNTER — Ambulatory Visit (HOSPITAL_COMMUNITY)
Admission: RE | Admit: 2015-02-22 | Discharge: 2015-02-22 | Disposition: A | Discharge status: Home / Self Care | Payer: Medicare Other | Source: Ambulatory Visit | Attending: Emergency Medicine | Admitting: Emergency Medicine

## 2015-02-22 DIAGNOSIS — R52 Pain, unspecified: Secondary | ICD-10-CM

## 2015-02-22 DIAGNOSIS — M79605 Pain in left leg: Secondary | ICD-10-CM | POA: Diagnosis not present

## 2015-02-22 DIAGNOSIS — R609 Edema, unspecified: Secondary | ICD-10-CM | POA: Diagnosis not present

## 2015-02-22 NOTE — Telephone Encounter (Signed)
Patient called stating that she was in the Emergency Dept this weekend Mrs. Orourke was seen at [facility]   Thank you

## 2015-02-22 NOTE — Telephone Encounter (Signed)
Aware of this

## 2015-02-22 NOTE — Progress Notes (Signed)
VASCULAR LAB PRELIMINARY  PRELIMINARY  PRELIMINARY  PRELIMINARY  Left lower extremity venous duplex completed.    Preliminary report:  No evidence of acute DVT noted in the left lower extremity. There is evidence of chronic DVT in the femoral, popliteal and posterior tibial veins which was evident in study performed by [facility] in 2013. There is no evidence of superficial thrombus or Baker's cyst of the left lower extremity.  Taygan Connell, RVS 02/22/2015, 10:06 AM

## 2015-02-22 NOTE — Telephone Encounter (Signed)
FYI  Please see below

## 2015-02-24 ENCOUNTER — Ambulatory Visit (INDEPENDENT_AMBULATORY_CARE_PROVIDER_SITE_OTHER): Payer: Medicare Other | Admitting: General Practice

## 2015-02-24 DIAGNOSIS — Z5181 Encounter for therapeutic drug level monitoring: Secondary | ICD-10-CM

## 2015-02-24 LAB — POCT INR: INR: 2.2

## 2015-02-24 NOTE — Progress Notes (Signed)
Pre visit review using our clinic review tool, if applicable. No additional management support is needed unless otherwise documented below in the visit note. 

## 2015-02-24 NOTE — Progress Notes (Signed)
I have reviewed and agree with the plan. 

## 2015-02-26 DIAGNOSIS — M25562 Pain in left knee: Secondary | ICD-10-CM | POA: Diagnosis not present

## 2015-02-26 DIAGNOSIS — M1712 Unilateral primary osteoarthritis, left knee: Secondary | ICD-10-CM | POA: Diagnosis not present

## 2015-03-02 ENCOUNTER — Other Ambulatory Visit: Payer: Self-pay | Admitting: *Deleted

## 2015-03-02 ENCOUNTER — Telehealth: Payer: Self-pay | Admitting: *Deleted

## 2015-03-02 ENCOUNTER — Other Ambulatory Visit (INDEPENDENT_AMBULATORY_CARE_PROVIDER_SITE_OTHER): Payer: Medicare Other

## 2015-03-02 DIAGNOSIS — Z5181 Encounter for therapeutic drug level monitoring: Secondary | ICD-10-CM

## 2015-03-02 DIAGNOSIS — E039 Hypothyroidism, unspecified: Secondary | ICD-10-CM | POA: Diagnosis not present

## 2015-03-02 LAB — COMPREHENSIVE METABOLIC PANEL
ALT: 76 U/L — ABNORMAL HIGH (ref 0–35)
AST: 45 U/L — ABNORMAL HIGH (ref 0–37)
Albumin: 3.7 g/dL (ref 3.5–5.2)
Alkaline Phosphatase: 72 U/L (ref 39–117)
BILIRUBIN TOTAL: 0.3 mg/dL (ref 0.2–1.2)
BUN: 16 mg/dL (ref 6–23)
CHLORIDE: 104 meq/L (ref 96–112)
CO2: 31 mEq/L (ref 19–32)
CREATININE: 0.81 mg/dL (ref 0.40–1.20)
Calcium: 9.2 mg/dL (ref 8.4–10.5)
GFR: 90.64 mL/min (ref >60.00)
GLUCOSE: 93 mg/dL (ref 70–99)
POTASSIUM: 4.4 meq/L (ref 3.5–5.1)
Sodium: 140 mEq/L (ref 135–145)
Total Protein: 6.9 g/dL (ref 6.0–8.3)

## 2015-03-02 LAB — T4, FREE: Free T4: 0.77 ng/dL (ref 0.60–1.60)

## 2015-03-02 LAB — TSH: TSH: 2.27 u[IU]/mL (ref 0.35–4.50)

## 2015-03-02 MED ORDER — BACLOFEN 10 MG PO TABS: ORAL_TABLET | ORAL | Status: DC

## 2015-03-02 NOTE — Telephone Encounter (Signed)
This is a 3 times a day medication.  Is this prescribed by orthopedic surgeon?

## 2015-03-02 NOTE — Telephone Encounter (Signed)
Noted patient is aware, rx sent

## 2015-03-02 NOTE — Telephone Encounter (Signed)
You prescribed on 12/29/14 because of her legs cramping.

## 2015-03-02 NOTE — Telephone Encounter (Signed)
Okay to send 4 times a day, may possibly get denied by insurance

## 2015-03-02 NOTE — Telephone Encounter (Signed)
Patient came in for labs today, she said she has been taking the baclofen 4 times a day instead of the 3 you prescribed, she wants to know if she can have a [facility]? Please advise.

## 2015-03-08 ENCOUNTER — Encounter: Payer: Self-pay | Admitting: Endocrinology

## 2015-03-08 ENCOUNTER — Ambulatory Visit (INDEPENDENT_AMBULATORY_CARE_PROVIDER_SITE_OTHER): Payer: Medicare Other | Admitting: Endocrinology

## 2015-03-08 ENCOUNTER — Other Ambulatory Visit: Payer: Self-pay | Admitting: *Deleted

## 2015-03-08 VITALS — BP 142/84 | HR 60 | Temp 98.0°F | Resp 16 | Ht 69.0 in | Wt 244.4 lb

## 2015-03-08 DIAGNOSIS — K7689 Other specified diseases of liver: Secondary | ICD-10-CM

## 2015-03-08 DIAGNOSIS — R252 Cramp and spasm: Secondary | ICD-10-CM | POA: Diagnosis not present

## 2015-03-08 DIAGNOSIS — E1142 Type 2 diabetes mellitus with diabetic polyneuropathy: Secondary | ICD-10-CM

## 2015-03-08 DIAGNOSIS — R531 Weakness: Secondary | ICD-10-CM

## 2015-03-08 DIAGNOSIS — G629 Polyneuropathy, unspecified: Secondary | ICD-10-CM

## 2015-03-08 DIAGNOSIS — R609 Edema, unspecified: Secondary | ICD-10-CM

## 2015-03-08 DIAGNOSIS — R945 Abnormal results of liver function studies: Secondary | ICD-10-CM

## 2015-03-08 MED ORDER — GLUCOSE BLOOD VI STRP: 1.0000 | ORAL_STRIP | Status: DC | PRN

## 2015-03-08 NOTE — Patient Instructions (Signed)
Citracal 400mg  twice daily

## 2015-03-08 NOTE — Progress Notes (Signed)
Patient ID: Kelli Martinez, female   DOB: 06-30-1946, 69 y.o.   MRN: [id number]    Chief complaint: Followup of various issues including muscle cramps  History of Present Illness:  The patient is seen for the following problems:  1. Postprandial hypoglycemia related to dumping syndrome  She has had postprandial hypoglycemia starting a couple of years after her gastric bypass surgery Her symptoms of blood sugars are mostly a feeling of significant weakness, some shakiness that she usually is eating fruit to relieve the symptoms She has been treated with acarbose  With some improvement. She did try verapamil empirically but this did not help.  She is taking her Precose usually right before eating She  Was given 2 tablets at breakfast and 1 at lunch and dinner    Recent history: she has had infrequent episodes of low blood sugars, only if late for meals and mostly before lunch Has recent documented low blood sugar of 56 before lunch at about 12:30 PM and another reading of 68 at about 10 AM She says she feels generally weak sometimes and not clear if this is related to low blood sugar, was symptomatic with glucose of 75  She does not take her Precose  before lunch, taking before breakfast and supper For breakfast she will have bacon, eggs and a fruit in the morning usually  She usually has a protein at lunch and dinner  She has been to the dietitian and has been instructed on balanced low-fat meals with enough protein consistently and restricting carbohydrates including fruits.  She thinks she is following the diet especially getting protein, occasionally has a very light breakfast with fruit only  Wt Readings from Last 3 Encounters:  03/08/15 244 lb 6.4 oz (110.859 kg)  12/04/14 240 lb 3.2 oz (108.954 kg)  10/28/14 233 lb 11.2 oz (106.006 kg)   2.  History of diabetes:   Usually has a normal A1c readings and this is now 6.2 Has not checked her blood sugars very often  and usually not after meals.   Currently on Precose as above She is not as active, previously had been trying to do some exercise like bicycling  Meal portion size is usually well controlled but she has gained weight again    Lab Results  Component Value Date   HGBA1C 6.2 12/30/2014   HGBA1C 6.6* 09/30/2014   HGBA1C 6.5 07/01/2014   Lab Results  Component Value Date   MICROALBUR <0.7 12/01/2014   LDLCALC 77 12/30/2014   CREATININE 0.81 03/02/2015      Other active problems: see review of systems   MUSCLE cramps: She says she has had more cramping in the last several days and this is in any muscle area She will also have some tonic contraction of her hands when this happens and on one occasion she felt her left leg shaking a little. She has been trying to baclofen for the cramps and does not think this helps much Already taking magnesium.  She tries OTC remedies also which do not seem to help Her magnesium level has been quite normal and she has normal electrolytes except for mildly low calcium in the ER last month     Medication List       This list is accurate as of: 03/08/15  3:10 PM.  Always use your most recent med list.               acarbose 25 MG tablet  Commonly  known as:  PRECOSE  TAKE 1 TABLET THREE TIMES A DAY     albuterol 108 (90 BASE) MCG/ACT inhaler  Commonly known as:  PROVENTIL HFA;VENTOLIN HFA  Inhale 2 puffs into the lungs 4 (four) times daily as needed. As needed rescue inhaler     amoxicillin 500 MG capsule  Commonly known as:  AMOXIL  Take 1 capsule (500 mg total) by mouth 3 (three) times daily.     atorvastatin 20 MG tablet  Commonly known as:  LIPITOR  Take 10 mg every other day     atorvastatin 20 MG tablet  Commonly known as:  LIPITOR  TAKE 1 TABLET DAILY     baclofen 10 MG tablet  Commonly known as:  LIORESAL  Take 1 tablet 4 times daily     clindamycin 150 MG capsule  Commonly known as:  CLEOCIN  Take 2 capsules (300 mg total)  by mouth 3 (three) times daily. May dispense as 14m capsules     ergocalciferol 50000 UNITS capsule  Commonly known as:  VITAMIN D2  Take 1 capsule (50,000 Units total) by mouth every Friday.     ferrous sulfate 325 (65 FE) MG tablet  Take 325 mg by mouth daily.     fluticasone 50 MCG/ACT nasal spray  Commonly known as:  FLONASE  Place 2 sprays into both nostrils daily.     Fluticasone-Salmeterol 100-50 MCG/DOSE Aepb  Commonly known as:  ADVAIR  Inhale 1 puff into the lungs every 12 (twelve) hours. Rinse mouth     furosemide 40 MG tablet  Commonly known as:  LASIX  TAKE 1 TABLET EVERY MORNING     gabapentin 300 MG capsule  Commonly known as:  NEURONTIN  TAKE 1 CAPSULE FOUR TIMES A DAY     glucose blood test strip  Commonly known as:  ACCU-CHEK AVIVA  1 each by Other route as needed for other. Use as instructed to check once a day dx code E11.9     HYDROcodone-acetaminophen 5-325 MG per tablet  Commonly known as:  NORCO  Take 1 tablet by mouth 2 (two) times daily as needed for moderate pain.     KLOR-CON M20 20 MEQ tablet  Generic drug:  potassium chloride SA  TAKE 1 TABLET DAILY     levothyroxine 125 MCG tablet  Commonly known as:  SYNTHROID, LEVOTHROID  TAKE 1 TABLET DAILY BEFORE BREAKFAST     magnesium chloride 64 MG Tbec SR tablet  Commonly known as:  SLOW-MAG  Take 1 tablet (64 mg total) by mouth 2 (two) times daily.     methocarbamol 500 MG tablet  Commonly known as:  ROBAXIN  Take 1 tablet (500 mg total) by mouth 2 (two) times daily.     methylcellulose 1 % ophthalmic solution  Commonly known as:  ARTIFICIAL TEARS  Place 1 drop into both eyes as needed. Dry eyes     NON FORMULARY  CPAP 6 [employer]     omeprazole 20 MG capsule  Commonly known as:  PRILOSEC  TAKE 1 CAPSULE DAILY     rOPINIRole 1 MG tablet  Commonly known as:  REQUIP  Take 1 mg by mouth at bedtime.     spironolactone 50 MG tablet  Commonly known as:  ALDACTONE  TAKE 1 TABLET DAILY       temazepam 15 MG capsule  Commonly known as:  RESTORIL  1-2 for sleep as needed     triamcinolone cream 0.1 %  Commonly known  as:  KENALOG  Apply 1 application topically 2 (two) times daily.     vitamin B-12 500 MCG tablet  Commonly known as:  CYANOCOBALAMIN  Take 500 mcg by mouth daily.     warfarin 5 MG tablet  Commonly known as:  COUMADIN  Take 2.5 mg by mouth daily. 12.106m Tues,Thurs, Sat, Sun     warfarin 10 MG tablet  Commonly known as:  COUMADIN  One tablet Mon, Wed, Fri     warfarin 5 MG tablet  Commonly known as:  COUMADIN  TAKE 2 TABLETS ONCE DAILY (THIS IS TO REPLACE THE 10 MG TABLET)        Allergies:  Allergies  Allergen Reactions  . Adhesive [Tape] Rash    Past Medical History  Diagnosis Date  . Allergy   . Asthma   . Pulmonary embolism 1998 / 1994 /1968  . HX: breast cancer   . Blind right eye     hemorrhage  . Hypothyroid   . Clotting disorder   . Hypertension   . Blood transfusion   . Morbid obesity   . Syncope and collapse 2011    due to anemia  . GERD (gastroesophageal reflux disease)   . Hyperlipidemia   . Osteoporosis   . Psoriasis   . Gallstones   . Anemia   . Elevated liver enzymes   . Diabetes mellitus     hypoglycemia since large wt loss  . OSA (obstructive sleep apnea)     uses c pap  . Arthritis   . Cancer     breast 1994  . Lymphedema of arm     RT  . Back pain, chronic     GETS INJECTIONS IN BACK    Past Surgical History  Procedure Laterality Date  . Abdominal hysterectomy    . Mastectomy      Right  . Gastric bypass  2011    bariatric surgery  . Ivc filter      recurrent DVT  . Bone marrow transplant  1994  . Cardiovascular stress test  05/23/2005    EF 53%  . [place]ocardiography  10/27/08    EF 55-60%  . Knee arthrotomy  1998  . Carpal tunnell      bil  . Cataracts    . Hiatal hernia repair    . Cholecystectomy  05/10/2012    Procedure: LAPAROSCOPIC CHOLECYSTECTOMY WITH INTRAOPERATIVE  CHOLANGIOGRAM;  Surgeon: MPedro Earls MD;  Location: [facility];  Service: General;  Laterality: N/A;  Laparoscopic Cholecystectomy with Intraoperative Cholangiogram    Family History  Problem Relation Age of Onset  . Sudden death Mother     car accident  . Cancer Father [phone]   lung  . Cancer Sister [phone]   lung cancer  . Asthma Daughter     Social History:  reports that she quit smoking about 36 years ago. Her smoking use included Cigarettes. She has a 5 pack-year smoking history. She has never used smokeless tobacco. She reports that she does not drink alcohol or use illicit drugs.  Review of Systems     BURNING in both legs feet all the time, also some sharp pains.  No numbness or tingling.  She does take gabapentin as needed and is taking up to 4 or 5 a day now with relief.  She was also having some problems with pains in her feet and has had some steroid injections Also her has lumbar spinal stenosis; with occasional  feeling of numbness in the left leg is going to see her orthopedic surgeon soon  EDEMA: She has had problems with  edema both in her legs and right arm Edema has occurred despite taking Aldactone 50 mg; with 100 mg she had mild hyperkalemia  She has not taken much Lasix recently because of cramps She does not use elastic stocking regularly   Cellulitis right leg: She had been seen in emergency room with redness and swelling of the right lower leg and this is better now  Lymphedema right arm, Persistent After radical  breast surgery, using  elastic compression stocking but it is worse now and she thinks that the sleeve is very guarded with holes   History of mild hypothyroidism: Adequately replaced  with 62.5 mcg ,  has had normal TSH  Lab Results  Component Value Date   TSH 2.27 03/02/2015    Hypercholesterolemia: LDL has been at target with Lipitor and she is compliant with this. Taking this also for cardiovascular prophylaxis Recent levels:  Lab Results    Component Value Date   CHOL 184 12/30/2014   HDL 98.60 12/30/2014   LDLCALC 77 12/30/2014   TRIG 43.0 12/30/2014   CHOLHDL 2 12/30/2014   Previous history of hypertension: Currently not on any medications except diuretics.  Blood pressure is high normal today but was normal in the ER last month  She has had periodic epidural injections for sciatica and back pain, followed by orthopedic surgeon   LABS:  Appointment on 03/02/2015  Component Date Value Ref Range Status  . Sodium 03/02/2015 140  135 - 145 mEq/L Final  . Potassium 03/02/2015 4.4  3.5 - 5.1 mEq/L Final  . Chloride 03/02/2015 104  96 - 112 mEq/L Final  . CO2 03/02/2015 31  19 - 32 mEq/L Final  . Glucose, Bld 03/02/2015 93  70 - 99 mg/dL Final  . BUN 03/02/2015 16  6 - 23 mg/dL Final  . Creatinine, Ser 03/02/2015 0.81  0.40 - 1.20 mg/dL Final  . Total Bilirubin 03/02/2015 0.3  0.2 - 1.2 mg/dL Final  . Alkaline Phosphatase 03/02/2015 72  39 - 117 U/L Final  . AST 03/02/2015 45* 0 - 37 U/L Final  . ALT 03/02/2015 76* 0 - 35 U/L Final  . Total Protein 03/02/2015 6.9  6.0 - 8.3 g/dL Final  . Albumin 03/02/2015 3.7  3.5 - 5.2 g/dL Final  . Calcium 03/02/2015 9.2  8.4 - 10.5 mg/dL Final  . GFR 03/02/2015 90.64  >60.00 mL/min Final  . TSH 03/02/2015 2.27  0.35 - 4.50 uIU/mL Final  . Free T4 03/02/2015 0.77  0.60 - 1.60 ng/dL Final  Anti-coag visit on 02/24/2015  Component Date Value Ref Range Status  . INR 02/24/2015 2.2   Final  Admission on 02/20/2015, Discharged on 02/20/2015  Component Date Value Ref Range Status  . WBC 02/20/2015 6.6  4.0 - 10.5 K/uL Final  . RBC 02/20/2015 4.32  3.87 - 5.11 MIL/uL Final  . Hemoglobin 02/20/2015 12.5  12.0 - 15.0 g/dL Final  . HCT 02/20/2015 38.7  36.0 - 46.0 % Final  . MCV 02/20/2015 89.6  78.0 - 100.0 fL Final  . MCH 02/20/2015 28.9  26.0 - 34.0 pg Final  . MCHC 02/20/2015 32.3  30.0 - 36.0 g/dL Final  . RDW 02/20/2015 14.0  11.5 - 15.5 % Final  . Platelets 02/20/2015 146*  150 - 400 K/uL Final  . Neutrophils Relative % 02/20/2015 59  43 -  77 % Final  . Neutro Abs 02/20/2015 3.9  1.7 - 7.7 K/uL Final  . Lymphocytes Relative 02/20/2015 33  12 - 46 % Final  . Lymphs Abs 02/20/2015 2.2  0.7 - 4.0 K/uL Final  . Monocytes Relative 02/20/2015 6  3 - 12 % Final  . Monocytes Absolute 02/20/2015 0.4  0.1 - 1.0 K/uL Final  . Eosinophils Relative 02/20/2015 1  0 - 5 % Final  . Eosinophils Absolute 02/20/2015 0.1  0.0 - 0.7 K/uL Final  . Basophils Relative 02/20/2015 1  0 - 1 % Final  . Basophils Absolute 02/20/2015 0.0  0.0 - 0.1 K/uL Final  . Sodium 02/20/2015 134* 135 - 145 mmol/L Final  . Potassium 02/20/2015 4.2  3.5 - 5.1 mmol/L Final  . Chloride 02/20/2015 103  101 - 111 mmol/L Final  . CO2 02/20/2015 22  22 - 32 mmol/L Final  . Glucose, Bld 02/20/2015 111* 65 - 99 mg/dL Final  . BUN 02/20/2015 18  6 - 20 mg/dL Final  . Creatinine, Ser 02/20/2015 0.75  0.44 - 1.00 mg/dL Final  . Calcium 02/20/2015 8.7* 8.9 - 10.3 mg/dL Final  . GFR calc non Af Amer 02/20/2015 >60  >60 mL/min Final  . GFR calc Af Amer 02/20/2015 >60  >60 mL/min Final   Comment: (NOTE) The eGFR has been calculated using the CKD EPI equation. This calculation has not been validated in all clinical situations. eGFR's persistently <60 mL/min signify possible Chronic Kidney Disease.   . Anion gap 02/20/2015 9  5 - 15 Final  . Prothrombin Time 02/20/2015 22.6* 11.6 - 15.2 seconds Final  . INR 02/20/2015 2.00* 0.00 - 1.49 Final     EXAM:  BP 142/84 mmHg  Pulse 60  Temp(Src) 98 F (36.7 C)  Resp 16  Ht 5' 9"  (1.753 m)  Wt 244 lb 6.4 oz (110.859 kg)  BMI 36.08 kg/m2  SpO2 98%  Lower leg edema 1+ present,  More on the left with slight induration No warmth of the skin or erythema on either side   Biceps reflexes difficult to elicit  Assessment/Plan:   1. Reactive/postprandial hypoglycemia from dumping syndrome.  She has periodic symptoms although not as frequent or severe  recently Has occasional episode before lunch Appears to be  responding to changes in diet as well as Precose before meals especially with taking this at breakfast time  She will continue her Precose unchanged  Continue balanced meals with protein and be consistent with snack midmorning   2.  MUSCLE cramps: These appear to be idiopathic and not clear of the etiology.  These appear to be generalized She is taking baclofen and gabapentin without much control Will also get opinion from neurologist, she wants to do this. Meanwhile she can try taking calcium supplements also.  Doubt if she is benefiting from taking magnesium   She also may be needing to do stretching exercises at bedtime  3. Mild diabetes: A1c is 6.6 recently and she needs to work on her weight and more consistent exercise when she can tolerate it He also needs to check postprandial readings  4.  Minimal increase in liver functions, these are abnormal again and unclear of etiology, has had negative evaluation from gastroenterology as previously;   if these are persistent we will need to reconsult gastroenterology  5.  NEUROPATHY: She is having paresthesiae occasionally.  She will continue to take gabapentin as needed    Venous leg edema treated with Aldactone and as  needed Lasix, less recently Encouraged her to try elastic stockings for control of edema  cellulitis of leg: Resolved  Maurice Fotheringham 03/08/2015, 3:10 PM

## 2015-03-09 ENCOUNTER — Other Ambulatory Visit: Payer: Self-pay | Admitting: Internal Medicine

## 2015-03-10 ENCOUNTER — Ambulatory Visit (INDEPENDENT_AMBULATORY_CARE_PROVIDER_SITE_OTHER): Payer: Medicare Other | Admitting: General Practice

## 2015-03-10 DIAGNOSIS — Z5181 Encounter for therapeutic drug level monitoring: Secondary | ICD-10-CM

## 2015-03-10 LAB — POCT INR: INR: 2.6

## 2015-03-10 NOTE — Progress Notes (Signed)
Pre visit review using our clinic review tool, if applicable. No additional management support is needed unless otherwise documented below in the visit note. 

## 2015-03-10 NOTE — Progress Notes (Signed)
I have reviewed and agree with the plan. 

## 2015-03-27 ENCOUNTER — Other Ambulatory Visit: Payer: Self-pay | Admitting: Endocrinology

## 2015-04-07 ENCOUNTER — Other Ambulatory Visit: Payer: Self-pay | Admitting: *Deleted

## 2015-04-07 ENCOUNTER — Telehealth: Payer: Self-pay | Admitting: Endocrinology

## 2015-04-07 ENCOUNTER — Ambulatory Visit (INDEPENDENT_AMBULATORY_CARE_PROVIDER_SITE_OTHER): Payer: Medicare Other | Admitting: General Practice

## 2015-04-07 DIAGNOSIS — Z23 Encounter for immunization: Secondary | ICD-10-CM

## 2015-04-07 DIAGNOSIS — Z5181 Encounter for therapeutic drug level monitoring: Secondary | ICD-10-CM | POA: Diagnosis not present

## 2015-04-07 MED ORDER — GLUCOSE BLOOD VI STRP: ORAL_STRIP | Status: DC

## 2015-04-07 MED ORDER — METHOCARBAMOL 500 MG PO TABS: 500.0000 mg | ORAL_TABLET | Freq: Two times a day (BID) | ORAL | Status: DC

## 2015-04-07 NOTE — Progress Notes (Signed)
Pre visit review using our clinic review tool, if applicable. No additional management support is needed unless otherwise documented below in the visit note. 

## 2015-04-07 NOTE — Telephone Encounter (Signed)
Patient need a refill of test strips for meter Accu Check,  Methocarbamolx 500 mg (she said it was a muscle but not sure) [facility].

## 2015-04-07 NOTE — Progress Notes (Signed)
I have reviewed and agree with the plan. 

## 2015-04-07 NOTE — Telephone Encounter (Signed)
rx sent

## 2015-04-12 ENCOUNTER — Other Ambulatory Visit: Payer: Self-pay | Admitting: *Deleted

## 2015-04-12 MED ORDER — FREESTYLE LANCETS MISC: Status: DC

## 2015-04-12 MED ORDER — GLUCOSE BLOOD VI STRP: ORAL_STRIP | Status: DC

## 2015-04-12 MED ORDER — FREESTYLE LITE DEVI
Status: DC
Start: 2015-04-12 — End: 2019-02-20

## 2015-04-13 ENCOUNTER — Other Ambulatory Visit (INDEPENDENT_AMBULATORY_CARE_PROVIDER_SITE_OTHER): Payer: Medicare Other

## 2015-04-13 DIAGNOSIS — G629 Polyneuropathy, unspecified: Secondary | ICD-10-CM | POA: Diagnosis not present

## 2015-04-13 DIAGNOSIS — E1142 Type 2 diabetes mellitus with diabetic polyneuropathy: Secondary | ICD-10-CM

## 2015-04-13 LAB — COMPREHENSIVE METABOLIC PANEL
ALK PHOS: 75 U/L (ref 39–117)
ALT: 41 U/L — ABNORMAL HIGH (ref 0–35)
AST: 31 U/L (ref 0–37)
Albumin: 3.8 g/dL (ref 3.5–5.2)
BUN: 11 mg/dL (ref 6–23)
CO2: 32 meq/L (ref 19–32)
Calcium: 9.2 mg/dL (ref 8.4–10.5)
Chloride: 103 mEq/L (ref 96–112)
Creatinine, Ser: 0.69 mg/dL (ref 0.40–1.20)
GFR: 109.03 mL/min (ref >60.00)
GLUCOSE: 108 mg/dL — AB (ref 70–99)
POTASSIUM: 4.2 meq/L (ref 3.5–5.1)
SODIUM: 140 meq/L (ref 135–145)
TOTAL PROTEIN: 6.9 g/dL (ref 6.0–8.3)
Total Bilirubin: 0.4 mg/dL (ref 0.2–1.2)

## 2015-04-20 ENCOUNTER — Encounter: Payer: Self-pay | Admitting: Endocrinology

## 2015-04-20 ENCOUNTER — Ambulatory Visit (INDEPENDENT_AMBULATORY_CARE_PROVIDER_SITE_OTHER): Payer: Medicare Other | Admitting: Endocrinology

## 2015-04-20 VITALS — BP 136/82 | HR 88 | Temp 98.8°F | Ht 70.0 in | Wt 244.0 lb

## 2015-04-20 DIAGNOSIS — R600 Localized edema: Secondary | ICD-10-CM | POA: Diagnosis not present

## 2015-04-20 DIAGNOSIS — R945 Abnormal results of liver function studies: Secondary | ICD-10-CM

## 2015-04-20 DIAGNOSIS — R252 Cramp and spasm: Secondary | ICD-10-CM

## 2015-04-20 DIAGNOSIS — K7689 Other specified diseases of liver: Secondary | ICD-10-CM | POA: Diagnosis not present

## 2015-04-20 NOTE — Patient Instructions (Addendum)
No excess water  Try taking 2 spironolactone for upto 2 weeks  Leg stockings  Check sugar when feeling weak

## 2015-04-20 NOTE — Progress Notes (Signed)
Patient ID: Kelli Martinez, female   DOB: 11-07-1945, 69 y.o.   MRN: [id number]    Chief complaint: Followup of various issues including muscle cramps  History of Present Illness:  The patient is seen for the following problems:  1. Postprandial hypoglycemia related to dumping syndrome  She has had postprandial hypoglycemia starting a couple of years after her gastric bypass surgery Her symptoms of blood sugars are mostly a feeling of significant weakness, some shakiness that she usually is eating fruit to relieve the symptoms She has been treated with acarbose  With some improvement. She did try verapamil empirically but this did not help.  She is taking her Precose usually right before eating She  Was given 2 tablets at breakfast and 1 at lunch and dinner    Recent history: she has had infrequent episodes of low blood sugars However since she has not had her test strip she has not checked her blood sugar much She is having infrequent symptoms and will treat these with carbohydrates  She does not take her Precose before lunch, taking before breakfast and supper For breakfast she will have bacon, eggs and a fruit in the morning usually  She usually has a protein at lunch and dinner  She has been to the dietitian and has been instructed on balanced low-fat meals with enough protein consistently and restricting carbohydrates including fruits.  She thinks she is following the diet especially getting protein, occasionally has a very light breakfast with fruit only  Wt Readings from Last 3 Encounters:  04/20/15 244 lb (110.678 kg)  03/08/15 244 lb 6.4 oz (110.859 kg)  12/04/14 240 lb 3.2 oz (108.954 kg)   2.  History of diabetes:   Usually has a normal A1c readings around 6.2-6.6 Has not checked her blood sugars recently and not clear of her lab glucose was fasting, was over 100 Currently on Precose as above She is not as active, doing a little water exercises  Meal  portion size is usually well controlled and her weight is stable   Lab Results  Component Value Date   HGBA1C 6.2 12/30/2014   HGBA1C 6.6* 09/30/2014   HGBA1C 6.5 07/01/2014   Lab Results  Component Value Date   MICROALBUR <0.7 12/01/2014   LDLCALC 77 12/30/2014   CREATININE 0.69 04/13/2015      Other active problems: see review of systems   MUSCLE cramps:  She has had muscle cramps in various locations including trunk and feet for several weeks now No etiology has been found She has tried baclofen, magnesium without any relief Because of a single level of calcium of 7.9 she was asked to take calcium supplements and she is taking 600 mg of calcium citrate now She does think that her cramps are somewhat better and is having mostly cramps in her feet especially when she is trying to put on her shoes and socks  She tries OTC remedies also which do not seem to help Her magnesium level has been quite normal and she has normal electrolytes  She has generally been avoiding Lasix which worsens the cramps     Medication List       This list is accurate as of: 04/20/15  9:24 PM.  Always use your most recent med list.               acarbose 25 MG tablet  Commonly known as:  PRECOSE  TAKE 1 TABLET THREE TIMES A DAY  ADVAIR DISKUS 100-50 MCG/DOSE Aepb  Generic drug:  Fluticasone-Salmeterol  USE 1 INHALATION EVERY 12 HOURS, RINSE MOUTH     albuterol 108 (90 BASE) MCG/ACT inhaler  Commonly known as:  PROVENTIL HFA;VENTOLIN HFA  Inhale 2 puffs into the lungs 4 (four) times daily as needed. As needed rescue inhaler     atorvastatin 20 MG tablet  Commonly known as:  LIPITOR  TAKE 1 TABLET DAILY     baclofen 10 MG tablet  Commonly known as:  LIORESAL  Take 1 tablet 4 times daily     clindamycin 150 MG capsule  Commonly known as:  CLEOCIN  Take 2 capsules (300 mg total) by mouth 3 (three) times daily. May dispense as 150mg  capsules     ergocalciferol 50000 UNITS capsule    Commonly known as:  VITAMIN D2  Take 1 capsule (50,000 Units total) by mouth every Friday.     ferrous sulfate 325 (65 FE) MG tablet  Take 325 mg by mouth daily.     fluticasone 50 MCG/ACT nasal spray  Commonly known as:  FLONASE  USE 2 SPRAYS IN EACH NOSTRIL DAILY     freestyle lancets  Use as instructed to check blood sugar once a day dx code E11.65     FREESTYLE LITE Devi  Use to check blood sugar once a day DX code E11.65     furosemide 40 MG tablet  Commonly known as:  LASIX  TAKE 1 TABLET EVERY MORNING     gabapentin 300 MG capsule  Commonly known as:  NEURONTIN  TAKE 1 CAPSULE FOUR TIMES A DAY     glucose blood test strip  Commonly known as:  FREESTYLE LITE  Use as instructed to check blood sugar once a day dx code E11.65     HYDROcodone-acetaminophen 5-325 MG per tablet  Commonly known as:  NORCO  Take 1 tablet by mouth 2 (two) times daily as needed for moderate pain.     levothyroxine 125 MCG tablet  Commonly known as:  SYNTHROID, LEVOTHROID  TAKE 1 TABLET DAILY BEFORE BREAKFAST     magnesium chloride 64 MG Tbec SR tablet  Commonly known as:  SLOW-MAG  Take 1 tablet (64 mg total) by mouth 2 (two) times daily.     methocarbamol 500 MG tablet  Commonly known as:  ROBAXIN  Take 1 tablet (500 mg total) by mouth 2 (two) times daily.     methylcellulose 1 % ophthalmic solution  Commonly known as:  ARTIFICIAL TEARS  Place 1 drop into both eyes as needed. Dry eyes     NON FORMULARY  CPAP 6 [employer]     omeprazole 20 MG capsule  Commonly known as:  PRILOSEC  TAKE 1 CAPSULE DAILY     rOPINIRole 1 MG tablet  Commonly known as:  REQUIP  Take 1 mg by mouth at bedtime.     spironolactone 50 MG tablet  Commonly known as:  ALDACTONE  TAKE 1 TABLET DAILY     temazepam 15 MG capsule  Commonly known as:  RESTORIL  1-2 for sleep as needed     triamcinolone cream 0.1 %  Commonly known as:  KENALOG  Apply 1 application topically 2 (two) times daily.      vitamin B-12 500 MCG tablet  Commonly known as:  CYANOCOBALAMIN  Take 500 mcg by mouth daily.     warfarin 5 MG tablet  Commonly known as:  COUMADIN  Take 2.5 mg by mouth daily. 12.5mg  Tues,Thurs,  Sat, Sun     warfarin 10 MG tablet  Commonly known as:  COUMADIN  One tablet Mon, Wed, Fri     warfarin 5 MG tablet  Commonly known as:  COUMADIN  TAKE 2 TABLETS ONCE DAILY (THIS IS TO REPLACE THE 10 MG TABLET)        Allergies:  Allergies  Allergen Reactions  . Adhesive [Tape] Rash    Past Medical History  Diagnosis Date  . Allergy   . Asthma   . Pulmonary embolism 1998 / 1994 /1968  . HX: breast cancer   . Blind right eye     hemorrhage  . Hypothyroid   . Clotting disorder   . Hypertension   . Blood transfusion   . Morbid obesity   . Syncope and collapse 2011    due to anemia  . GERD (gastroesophageal reflux disease)   . Hyperlipidemia   . Osteoporosis   . Psoriasis   . Gallstones   . Anemia   . Elevated liver enzymes   . Diabetes mellitus     hypoglycemia since large wt loss  . OSA (obstructive sleep apnea)     uses c pap  . Arthritis   . Cancer     breast 1994  . Lymphedema of arm     RT  . Back pain, chronic     GETS INJECTIONS IN BACK    Past Surgical History  Procedure Laterality Date  . Abdominal hysterectomy    . Mastectomy      Right  . Gastric bypass  2011    bariatric surgery  . Ivc filter      recurrent DVT  . Bone marrow transplant  1994  . Cardiovascular stress test  05/23/2005    EF 53%  . US echocardiography  10/27/08    EF 55-60%  . Knee arthrotomy  1998  . Carpal tunnell      bil  . Cataracts    . Hiatal hernia repair    . Cholecystectomy  05/10/2012    Procedure: LAPAROSCOPIC CHOLECYSTECTOMY WITH INTRAOPERATIVE CHOLANGIOGRAM;  Surgeon: Pedro Earls, MD;  Location: [facility];  Service: General;  Laterality: N/A;  Laparoscopic Cholecystectomy with Intraoperative Cholangiogram    Family History  Problem Relation Age of  Onset  . Sudden death Mother     car accident  . Cancer Father 55    lung  . Cancer Sister 26    lung cancer  . Asthma Daughter     Social History:  reports that she quit smoking about 36 years ago. Her smoking use included Cigarettes. She has a 5 pack-year smoking history. She has never used smokeless tobacco. She reports that she does not drink alcohol or use illicit drugs.  Review of Systems     EDEMA: She has had problems with  edema both in her legs and right arm She thinks she has had more edema of her legs on and off especially in the evenings. She was told to use elastic stockings but she did not get the prescription filled Edema has occurred despite taking Aldactone 50 mg; with 100 mg she had mild hyperkalemia  She has started taking a little Lasix recently because of increasing edema, usually half twice a day as needed This does cause cramps  Lymphedema right arm, Persistent After radical  breast surgery, using  elastic compression stocking and she wants a new prescription  History of mild hypothyroidism: Adequately replaced  with 62.5 mcg ,  has had normal TSH  Lab Results  Component Value Date   TSH 2.27 03/02/2015    Hypercholesterolemia: LDL has been at target with Lipitor and she is compliant with this. Taking this also for cardiovascular prophylaxis Recent levels:  Lab Results  Component Value Date   CHOL 184 12/30/2014   HDL 98.60 12/30/2014   LDLCALC 77 12/30/2014   TRIG 43.0 12/30/2014   CHOLHDL 2 12/30/2014    Previous history of hypertension: Currently not on any medications except diuretics.   She has had periodic epidural injections for sciatica and back pain, followed by orthopedic surgeon   LABS:  Appointment on 04/13/2015  Component Date Value Ref Range Status  . Sodium 04/13/2015 140  135 - 145 mEq/L Final  . Potassium 04/13/2015 4.2  3.5 - 5.1 mEq/L Final  . Chloride 04/13/2015 103  96 - 112 mEq/L Final  . CO2 04/13/2015 32  19 - 32  mEq/L Final  . Glucose, Bld 04/13/2015 108* 70 - 99 mg/dL Final  . BUN 04/13/2015 11  6 - 23 mg/dL Final  . Creatinine, Ser 04/13/2015 0.69  0.40 - 1.20 mg/dL Final  . Total Bilirubin 04/13/2015 0.4  0.2 - 1.2 mg/dL Final  . Alkaline Phosphatase 04/13/2015 75  39 - 117 U/L Final  . AST 04/13/2015 31  0 - 37 U/L Final  . ALT 04/13/2015 41* 0 - 35 U/L Final  . Total Protein 04/13/2015 6.9  6.0 - 8.3 g/dL Final  . Albumin 04/13/2015 3.8  3.5 - 5.2 g/dL Final  . Calcium 04/13/2015 9.2  8.4 - 10.5 mg/dL Final  . GFR 04/13/2015 109.03  >60.00 mL/min Final     EXAM:  BP 136/82 mmHg  Pulse 88  Temp(Src) 98.8 F (37.1 C) (Oral)  Ht 5\' 10"  (1.778 m)  Wt 244 lb (110.678 kg)  BMI 35.01 kg/m2  SpO2 90%  Lower leg edema 2+ present, more on the left with slight induration     Assessment/Plan:   1. Reactive/postprandial hypoglycemia from dumping syndrome.  She has periodic symptoms although not as frequent or severe now She will restart checking her sugar with symptoms and treat with simple sugars only Appears to be  responding to changes in diet as well as Precose before meals  She will continue her Precose unchanged  Continue balanced meals with protein and be consistent with snack midmorning   2.  MUSCLE cramps: These appear to be idiopathic and not clear of the etiology.  These appear to be generalized She is taking baclofen and gabapentin but she thinks that now she has some improvement with adding calcium supplements Cramps are usually worse with taking diuretics like Lasix Since she is improving will hold off on neurology consultation  3. Mild diabetes: A1c to be checked on the next visit and she will start monitoring postprandial readings and fasting readings also  4.  Liver functions are improving, etiology unclear   5.  EDEMA of the legs: This is likely to be from venous insufficiency and she needs to start using the elastic stockings. Will empirically try 100 mg of  Aldactone, she will follow-up with potassium level in 2 weeks   Kelli Martinez 04/20/2015, 9:24 PM

## 2015-04-22 ENCOUNTER — Other Ambulatory Visit: Payer: Self-pay | Admitting: Endocrinology

## 2015-04-23 ENCOUNTER — Telehealth: Payer: Self-pay | Admitting: Endocrinology

## 2015-04-23 NOTE — Telephone Encounter (Signed)
Patient stated that pharmacist said she need a prescription to the a voucher for [employer] Plus meter, please advise

## 2015-04-26 ENCOUNTER — Ambulatory Visit: Payer: Medicare Other | Admitting: Neurology

## 2015-04-27 ENCOUNTER — Ambulatory Visit (INDEPENDENT_AMBULATORY_CARE_PROVIDER_SITE_OTHER): Payer: Medicare Other | Admitting: Internal Medicine

## 2015-04-27 ENCOUNTER — Encounter: Payer: Self-pay | Admitting: Internal Medicine

## 2015-04-27 VITALS — BP 130/62 | HR 62 | Ht 68.5 in | Wt 247.2 lb

## 2015-04-27 DIAGNOSIS — J45998 Other asthma: Secondary | ICD-10-CM

## 2015-04-27 DIAGNOSIS — J309 Allergic rhinitis, unspecified: Secondary | ICD-10-CM | POA: Diagnosis not present

## 2015-04-27 DIAGNOSIS — J3089 Other allergic rhinitis: Secondary | ICD-10-CM

## 2015-04-27 DIAGNOSIS — G4733 Obstructive sleep apnea (adult) (pediatric): Secondary | ICD-10-CM

## 2015-04-27 DIAGNOSIS — J4520 Mild intermittent asthma, uncomplicated: Secondary | ICD-10-CM | POA: Diagnosis not present

## 2015-04-27 DIAGNOSIS — J302 Other seasonal allergic rhinitis: Secondary | ICD-10-CM

## 2015-04-27 MED ORDER — FLUTICASONE-SALMETEROL 100-50 MCG/DOSE IN AEPB: INHALATION_SPRAY | RESPIRATORY_TRACT | Status: DC

## 2015-04-27 MED ORDER — ALBUTEROL SULFATE HFA 108 (90 BASE) MCG/ACT IN AERS: 2.0000 | INHALATION_SPRAY | Freq: Four times a day (QID) | RESPIRATORY_TRACT | Status: DC | PRN

## 2015-04-27 NOTE — Patient Instructions (Signed)
Order- office spirometry   dx asthma, mild intermittent

## 2015-04-27 NOTE — Progress Notes (Signed)
Subjective:    Patient ID: Kelli Martinez, female    DOB: July 07, 1946, 69 y.o.   MRN: [id number]  HPI 03/21/11- 29 yoF former smoker followed for OSA, allergic rhinitis,asthma,  hx pulm embolism/DVT/filter/coumadin(Dr Dwyane Dee), hx R breast Ca/ mastectomy/ xrt/chem/bonemarrow transplant at [facility], hx gastric bypass Last here March 22, 2010 Uses CPAP 6 Advanced- most of each night. Averages 6 hrs sleep/ night. Pressure seems right. Wakes in AM with dry mouth. Sleeps alone.  Wakes with dry mouth and stuffy nose.  Continues Advair. Dry cough can lead to asthma. Needs rescue inhaler refilled.- needed it daily while working summer garden.   04/23/12- 66 yoF former smoker followed for OSA, allergic rhinitis,asthma,  hx pulm embolism/DVT/filter/coumadin(Dr Dwyane Dee), hx R breast Ca/ mastectomy/ xrt/chem/bonemarrow transplant at [facility], hx gastric bypass Wakes up in mid of night gasping for air if not wearing CPAP; When she wears CPAP 6/ Advanced about 3-4 hours each night-nostrils and throat get too dry so unable to wear for longer periods of time; pressure seems to work well for patient.  04/21/13- 50 yoF former smoker followed for OSA, allergic rhinitis,asthma,  hx pulm embolism/DVT/filter/coumadin(Dr Dwyane Dee), hx R breast Ca/ mastectomy/ xrt/chem/bonemarrow transplant at [place], hx gastric bypass FOLLOWS FOR:  Not wearing CPAP at all states gets sinuses too dry and throat, also loud and wakes her up Alone with no body to comment on her snoring. Insomnia, helped by gabapentin given for peripheral neuropathy. Always stuffy right nostril with no drainage. Asthma control good with Advair, occasional rescue inhaler.  04/21/14- 68 yoF former smoker followed for OSA, allergic rhinitis,asthma,  hx pulm embolism/DVT/filter/coumadin(Dr Dwyane Dee), hx R breast Ca/ mastectomy/ xrt/chem/bonemarrow transplant at [place], hx gastric bypass FOLLOWS FOR: Pt wearing cpap about 4 hours nightly, 3 nights/week.  Denies any complaints with  mask/pressure/suplpies.  Needs refills on sleep aid.   04/27/15- 69 yoF former smoker followed for OSA/ quit CPAP, allergic rhinitis,asthma,  hx pulm embolism/DVT/filter/coumadin(Dr Dwyane Dee), hx R breast Ca/ mastectomy/ xrt/chem/bonemarrow transplant at [place], hx gastric bypass FOLLOWS FOR: Not wearing CPAP 6/ Advanced for months now-feels she sleeps fine without it. DME is [facility]; pt told them to stop sending supplies.    Had flu shot She lives alone with no bloody 2 describes snoring. Rarely needs naps because she does not feel sleepy in the daytime. No routine wheeze or cough. Occasional sinus congestion managed with Flonase. Office Spirometry 04/27/2015-WNL-FEV1/FVC 0.81, FEV1 2.50/110% CT chest 11/05/14-reviewed IMPRESSION: Status post right mastectomy with right axillary lymph node dissection. Radiation changes in the right upper lobe. No evidence of recurrent or metastatic disease. Postsurgical changes related to gastric bypass, cholecystectomy, and hysterectomy. IVC filter. Additional ancillary findings as above. Electronically Signed  By: Julian Hy M.D.  On: 11/05/2014 10:25  Review of Systems- see HPI Constitutional:   No-   weight loss, night sweats, fevers, chills, fatigue, lassitude. HEENT:   No-  headaches, difficulty swallowing, tooth/dental problems, sore throat,   +dry mouth      No-  sneezing, itching, ear ache,  +nasal congestion, post nasal drip,  CV:  No-   chest pain, orthopnea, PND, swelling in lower extremities, anasarca, dizziness, palpitations Resp: No-   shortness of breath with exertion or at rest.              No-   productive cough,  No non-productive cough,  No-  coughing up of blood.              No-   change in color of  mucus.   Skin: No-   rash or lesions. GI:  No-   heartburn, indigestion, abdominal pain, nausea, vomiting,  GU:  MS:  No-   joint pain,  +  swelling.   Neuro- :  Psych:  No- change in mood or affect. No depression or anxiety.  No  memory loss.   Objective:   Physical Exam General- Alert, Oriented, Affect-appropriate, Distress- none acute  + obese Skin- rash-none, lesions- none, excoriation- none. XRT skin changes right lateral neck Lymphadenopathy- none Head- atraumatic            Eyes- Gross vision intact, PERRLA, conjunctivae clear secretions            Ears- Hearing, canals normal            Nose- Clear, no- Septal dev, + sticky mucus, polyps, erosion, perforation             Throat- Mallampati III , mucosa clear , drainage- none, tonsils- atrophic, missing teeth Neck- flexible , trachea midline, no stridor , thyroid nl, carotid no bruit Chest - symmetrical excursion , unlabored           Heart/CV- RRR , no murmur , no gallop  , no rub, nl s1 s2                           - JVD- none , edema+L>R, stasis changes- none, varices- none           Lung- clear to P&A, wheeze- none, cough- none , dullness-none, rub- none           Chest wall- + right mastectomy Abd-  Br/ Gen/ Rectal- Not done, not indicated Extrem- cyanosis- none, clubbing, none, atrophy- none, strength- nl   Heavy legs. +Lymphedema right forearm/ elastic sleeve Neuro- grossly intact to observation  Assessment & Plan:

## 2015-04-28 ENCOUNTER — Telehealth: Payer: Self-pay | Admitting: Oncology

## 2015-04-28 ENCOUNTER — Ambulatory Visit (HOSPITAL_BASED_OUTPATIENT_CLINIC_OR_DEPARTMENT_OTHER): Payer: Medicare Other | Admitting: Oncology

## 2015-04-28 ENCOUNTER — Other Ambulatory Visit (HOSPITAL_BASED_OUTPATIENT_CLINIC_OR_DEPARTMENT_OTHER): Payer: Medicare Other

## 2015-04-28 VITALS — BP 142/70 | HR 64 | Temp 98.2°F | Resp 18 | Ht 68.5 in | Wt 248.0 lb

## 2015-04-28 DIAGNOSIS — Z853 Personal history of malignant neoplasm of breast: Secondary | ICD-10-CM

## 2015-04-28 DIAGNOSIS — D500 Iron deficiency anemia secondary to blood loss (chronic): Secondary | ICD-10-CM | POA: Diagnosis not present

## 2015-04-28 DIAGNOSIS — R1013 Epigastric pain: Secondary | ICD-10-CM

## 2015-04-28 DIAGNOSIS — Z9049 Acquired absence of other specified parts of digestive tract: Secondary | ICD-10-CM

## 2015-04-28 DIAGNOSIS — R600 Localized edema: Secondary | ICD-10-CM

## 2015-04-28 LAB — IRON AND TIBC CHCC
%SAT: 28 % (ref 21–57)
IRON: 97 ug/dL (ref 41–142)
TIBC: 344 ug/dL (ref 236–444)
UIBC: 247 ug/dL (ref 120–384)

## 2015-04-28 LAB — CBC WITH DIFFERENTIAL/PLATELET
BASO%: 1.5 % (ref 0.0–2.0)
Basophils Absolute: 0.1 10*3/uL (ref 0.0–0.1)
EOS%: 2.6 % (ref 0.0–7.0)
Eosinophils Absolute: 0.1 10*3/uL (ref 0.0–0.5)
HEMATOCRIT: 39.9 % (ref 34.8–46.6)
HEMOGLOBIN: 12.8 g/dL (ref 11.6–15.9)
LYMPH#: 1.3 10*3/uL (ref 0.9–3.3)
LYMPH%: 33.4 % (ref 14.0–49.7)
MCH: 28.8 pg (ref 25.1–34.0)
MCHC: 32.1 g/dL (ref 31.5–36.0)
MCV: 89.9 fL (ref 79.5–101.0)
MONO#: 0.2 10*3/uL (ref 0.1–0.9)
MONO%: 5.9 % (ref 0.0–14.0)
NEUT#: 2.2 10*3/uL (ref 1.5–6.5)
NEUT%: 56.6 % (ref 38.4–76.8)
Platelets: 171 10*3/uL (ref 145–400)
RBC: 4.44 10*6/uL (ref 3.70–5.45)
RDW: 14 % (ref 11.2–14.5)
WBC: 3.9 10*3/uL (ref 3.9–10.3)

## 2015-04-28 LAB — COMPREHENSIVE METABOLIC PANEL (CC13)
ALBUMIN: 3.4 g/dL — AB (ref 3.5–5.0)
ALK PHOS: 86 U/L (ref 40–150)
ALT: 42 U/L (ref 0–55)
AST: 29 U/L (ref 5–34)
Anion Gap: 6 mEq/L (ref 3–11)
BUN: 13.9 mg/dL (ref 7.0–26.0)
CALCIUM: 9.2 mg/dL (ref 8.4–10.4)
CHLORIDE: 104 meq/L (ref 98–109)
CO2: 28 mEq/L (ref 22–29)
CREATININE: 0.8 mg/dL (ref 0.6–1.1)
EGFR: 84 mL/min/{1.73_m2} — ABNORMAL LOW (ref >90)
GLUCOSE: 101 mg/dL (ref 70–140)
Potassium: 4.1 mEq/L (ref 3.5–5.1)
Sodium: 139 mEq/L (ref 136–145)
Total Bilirubin: 0.38 mg/dL (ref 0.20–1.20)
Total Protein: 6.7 g/dL (ref 6.4–8.3)

## 2015-04-28 LAB — FERRITIN CHCC: FERRITIN: 915 ng/mL — AB (ref 9–269)

## 2015-04-28 NOTE — Telephone Encounter (Signed)
Gave and printed appt sched and avs for pt for June 2017 °

## 2015-04-28 NOTE — Progress Notes (Signed)
Hematology and Oncology Follow Up Visit  Kelli Martinez [id number] [birth year] 69 y.o. 04/28/2015 8:59 AM   Principle Diagnosis:  69 year old female with the following issues:  1. Multifactorial anemia.  She has an element of iron deficiency, as well as anemia of renal disease, currently on p.o. iron replacement. No IV iron needed at this time. 2. History of breast cancer diagnosed in 1993. She is status post mastectomy followed by adjuvant radiation therapy.  She had also received high-dose chemotherapy and stem-cell transplantation in 1994.  She continues to be in remission from that standpoint.   3. History of gastric bypass surgery done in April 2011. 4. Status post endoscopy on 11/01/2009 and showed a small nodule biopsy-proven to be GIST.   Interim History: Kelli Martinez presents today for a followup visit. Since the last visit, she reports doing well overall. She is fatigued at times but is not interfering with her function. 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 Her appetite appear reasonable and her weight is relatively stable.  She reports her abdominal pain have resolved and her repeat liver function tests have nearly normalized. Her blood sugars continues to be an issue followed by Dr. Dwyane Dee. She still have fluid retention and currently on Aldactone.  She does not report any headaches, blurry vision, syncope or seizures. He does not report any fevers, chills or sweats. She does not report any chest pain; does not report any difficulty breathing. Does not report any chest wall masses or discomfort. She does not report any nausea, vomiting or change in her bowel habits. He does not report any urinary symptoms. Rest of her review of systems unremarkable.   Medications: I have reviewed the patient's current medications.  Current Outpatient Prescriptions  Medication Sig Dispense Refill  . acarbose  (PRECOSE) 25 MG tablet TAKE 1 TABLET THREE TIMES A DAY 270 tablet 1  . albuterol (PROVENTIL HFA;VENTOLIN HFA) 108 (90 BASE) MCG/ACT inhaler Inhale 2 puffs into the lungs 4 (four) times daily as needed. As needed rescue inhaler 3 Inhaler 3  . atorvastatin (LIPITOR) 20 MG tablet TAKE 1 TABLET DAILY (Patient taking differently: take 1/2 tablet QOD) 90 tablet 1  . baclofen (LIORESAL) 10 MG tablet Take 1 tablet 4 times daily 360 each 1  . Blood Glucose Monitoring Suppl (FREESTYLE FREEDOM LITE) W/DEVICE KIT use as directed 1 each 0  . Blood Glucose Monitoring Suppl (FREESTYLE LITE) DEVI Use to check blood sugar once a day DX code E11.65 1 each 0  . clindamycin (CLEOCIN) 150 MG capsule Take 2 capsules (300 mg total) by mouth 3 (three) times daily. May dispense as 145m capsules 60 capsule 0  . ergocalciferol (VITAMIN D2) 50000 UNITS capsule Take 1 capsule (50,000 Units total) by mouth every Friday. 12 capsule 3  . ferrous sulfate 325 (65 FE) MG tablet Take 325 mg by mouth daily.      . fluticasone (FLONASE) 50 MCG/ACT nasal spray USE 2 SPRAYS IN EACH NOSTRIL DAILY 48 g 2  . Fluticasone-Salmeterol (ADVAIR DISKUS) 100-50 MCG/DOSE AEPB USE 1 INHALATION EVERY 12 HOURS, RINSE MOUTH 180 each 2  . furosemide (LASIX) 40 MG tablet TAKE 1 TABLET EVERY MORNING 90 tablet 1  . gabapentin (NEURONTIN) 300 MG capsule TAKE 1 CAPSULE FOUR TIMES A DAY 360 capsule 1  . glucose blood (FREESTYLE LITE) test strip Use as instructed to check blood sugar once a day dx code  E11.65 100 each 1  . HYDROcodone-acetaminophen (NORCO) 5-325 MG per tablet Take 1 tablet by mouth 2 (two) times daily as needed for moderate pain. 60 tablet 0  . Lancets (FREESTYLE) lancets Use as instructed to check blood sugar once a day dx code E11.65 100 each 1  . levothyroxine (SYNTHROID, LEVOTHROID) 125 MCG tablet TAKE 1 TABLET DAILY BEFORE BREAKFAST 90 tablet 1  . magnesium chloride (SLOW-MAG) 64 MG TBEC SR tablet Take 1 tablet (64 mg total) by mouth 2  (two) times daily. 60 tablet 6  . methocarbamol (ROBAXIN) 500 MG tablet Take 1 tablet (500 mg total) by mouth 2 (two) times daily. 180 tablet 1  . methylcellulose (ARTIFICIAL TEARS) 1 % ophthalmic solution Place 1 drop into both eyes as needed. Dry eyes    . NON FORMULARY CPAP 6 [facility]    . omeprazole (PRILOSEC) 20 MG capsule TAKE 1 CAPSULE DAILY 90 capsule 1  . rOPINIRole (REQUIP) 1 MG tablet Take 1 mg by mouth at bedtime.  0  . spironolactone (ALDACTONE) 50 MG tablet TAKE 1 TABLET DAILY 90 tablet 1  . temazepam (RESTORIL) 15 MG capsule 1-2 for sleep as needed 30 capsule 1  . triamcinolone cream (KENALOG) 0.1 % Apply 1 application topically 2 (two) times daily. 30 g 1  . vitamin B-12 (CYANOCOBALAMIN) 500 MCG tablet Take 500 mcg by mouth daily.      Marland Kitchen warfarin (COUMADIN) 10 MG tablet One tablet Mon, Wed, Fri    . warfarin (COUMADIN) 5 MG tablet Take 2.5 mg by mouth daily. 12.67m Tues,Thurs, Sat, Sun    . warfarin (COUMADIN) 5 MG tablet TAKE 2 TABLETS ONCE DAILY (THIS IS TO REPLACE THE 10 MG TABLET) 180 tablet 1   No current facility-administered medications for this visit.     Allergies:  Allergies  Allergen Reactions  . Adhesive [Tape] Rash    Past Medical History, Surgical history, Social history, and Family History were reviewed and updated.   Physical Exam: Blood pressure 142/70, pulse 64, temperature 98.2 F (36.8 C), temperature source Oral, resp. rate 18, height 5' 8.5" (1.74 m), weight 248 lb (112.492 kg), SpO2 99 %. ECOG: 1 General appearance: alert awake pleasant woman without distress. Head: Normocephalic, without obvious abnormality oral mucosa is moist and pink. Neck: no adenopathy Lymph nodes: Cervical, supraclavicular, and axillary nodes normal. Heart:regular rate and rhythm, S1, S2 normal, no murmur, click, rub or gallop Chest wall examination:  Scar tissue noted in the right axilla. No masses. Lung:chest clear, no wheezing, rales, normal symmetric air  entry Abdomin: soft, non-tender, without masses or organomegaly no shifting dullness or ascites. EXT:. Bilateral lower extremity edema noted.   Lab Results: Lab Results  Component Value Date   WBC 3.9 04/28/2015   HGB 12.8 04/28/2015   HCT 39.9 04/28/2015   MCV 89.9 04/28/2015   PLT 171 04/28/2015     Chemistry      Component Value Date/Time   NA 140 04/13/2015 0931   NA 140 10/28/2014 0824   K 4.2 04/13/2015 0931   K 4.0 10/28/2014 0824   CL 103 04/13/2015 0931   CL 102 10/29/2012 1014   CO2 32 04/13/2015 0931   CO2 28 10/28/2014 0824   BUN 11 04/13/2015 0931   BUN 17.4 10/28/2014 0824   CREATININE 0.69 04/13/2015 0931   CREATININE 0.8 10/28/2014 0824   CREATININE 0.83 04/28/2014 1015      Component Value Date/Time   CALCIUM 9.2 04/13/2015 0931   CALCIUM 9.1  10/28/2014 0824   ALKPHOS 75 04/13/2015 0931   ALKPHOS 80 10/28/2014 0824   AST 31 04/13/2015 0931   AST 46* 10/28/2014 0824   ALT 41* 04/13/2015 0931   ALT 65* 10/28/2014 0824   BILITOT 0.4 04/13/2015 0931   BILITOT 0.36 10/28/2014 0824     EXAM: CT CHEST, ABDOMEN, AND PELVIS WITH CONTRAST  TECHNIQUE: Multidetector CT imaging of the chest, abdomen and pelvis was performed following the standard protocol during bolus administration of intravenous contrast.  CONTRAST: 187m OMNIPAQUE IOHEXOL 300 MG/ML SOLN  COMPARISON: CT abdomen pelvis dated 11/11/2012  FINDINGS: CT CHEST FINDINGS  Mediastinum/Nodes: The heart is normal in size. No pericardial effusion.  Coronary atherosclerosis in the LAD and circumflex.  Mild atherosclerotic calcifications of the aortic arch.  No suspicious mediastinal, hilar, or axillary lymphadenopathy.  Status post right axillary lymph node dissection.  Visualized thyroid is unremarkable.  Lungs/Pleura: Radiation changes in the anterior right upper lobe.  Compressive atelectasis in the medial right lower lobe. Dependent atelectasis in the left lower  lobe.  No suspicious pulmonary nodules.  No pleural effusion or pneumothorax.  Musculoskeletal: Status post right mastectomy.  Degenerative changes of the thoracic spine.  CT ABDOMEN PELVIS FINDINGS  Hepatobiliary: Liver is within normal limits.  Status post cholecystectomy. No intrahepatic or extrahepatic ductal dilatation.  Pancreas: Within normal limits.  Spleen: Within normal limits.  Adrenals/Urinary Tract: Adrenal glands are within normal limits.  11 mm right lower pole renal cyst. Kidneys are otherwise within normal limits.  Bladder is within normal limits.  Stomach/Bowel: Postsurgical changes related to gastric bypass.  No evidence of bowel obstruction.  Normal appendix.  Moderate colonic stool burden.  Vascular/Lymphatic: No evidence of abdominal aortic aneurysm.  IVC filter.  No suspicious abdominopelvic lymphadenopathy.  Reproductive: Status post hysterectomy.  Left ovary is notable for a in 18 mm cyst (series 2/image 100).  Right ovary is within normal limits.  Other: No abdominopelvic ascites.  Musculoskeletal: Degenerative changes of the lumbar spine.  IMPRESSION: Status post right mastectomy with right axillary lymph node dissection. Radiation changes in the right upper lobe.  No evidence of recurrent or metastatic disease.  Postsurgical changes related to gastric bypass, cholecystectomy, and hysterectomy.  IVC filter.  Additional ancillary findings as above.   Electronically Signed  By: SJulian HyM.D.  On: 11/05/2014 10:25    Impression and Plan: This is a 69year old female with the following issues: 1. Multifactorial anemia.  This is predominately anemia of iron deficiency.  She is on iron replacement and hemoglobin and iron studies continue to be normal. Her laboratory testing from today and iron studies from March 2016 were reviewed and indicate normal studies. She is at risk of  developing recurrent iron deficiency given her gastric bypass operation. We will continue to check her counts periodically and replace as needed I have recommended to continuing iron replacement at the time being orally. IV iron can be used in the future if needed to. 2. History of breast cancer.  She has no evidence of any recurrence at this time. Physical examination today did not reveal any evidence of recurrence.  CT scan on April 2016 was reviewed today and showed no evidence of relapse. 3. History of deep vein thrombosis, currently anticoagulated with Coumadin. She has not had any recurrent clots recently. 4. History of GIST detected on endoscopy without any evidence of recurrence. There is no clinical signs or symptoms of relapse at this time. We will continue to monitor her clinically. CT  scan on April 2016 did not show any evidence of relapse. 5. Abnormal liver function tests: Unclear etiology and currently have resolved. Liver function tests from 04/13/2015 were reviewed and discussed with the patient and appear to be within normal range. 6. MDS risk: Given the high dose chemotherapy she received she is at risk of developing myelodysplasia and possible leukemia. We will continue to screen you with complete blood counts periodically. 7. Followup will be  In 9 months sooner if needed it to.    [facility], MD 9/28/20168:59 AM

## 2015-04-30 NOTE — Assessment & Plan Note (Signed)
She is quite satisfied to be without CPAP, denying daytime sleepiness, fragmented nighttime sleep or other concerns. I recommended weight loss and sleep off flat of back. Plan-[facility] to DC CPAP if she is still paying for it

## 2015-04-30 NOTE — Assessment & Plan Note (Signed)
Despite history of smoking, asthma, pulmonary embolism and radiation therapy with some associated lung fibrosis, lung functions are normal on office spirometry Plan-observation. Weight loss would help. Encourage regular walking for endurance.

## 2015-04-30 NOTE — Assessment & Plan Note (Signed)
Uncomplicated and well controlled with Flonase

## 2015-05-04 ENCOUNTER — Other Ambulatory Visit (INDEPENDENT_AMBULATORY_CARE_PROVIDER_SITE_OTHER): Payer: Medicare Other

## 2015-05-04 DIAGNOSIS — R252 Cramp and spasm: Secondary | ICD-10-CM | POA: Diagnosis not present

## 2015-05-05 ENCOUNTER — Ambulatory Visit (INDEPENDENT_AMBULATORY_CARE_PROVIDER_SITE_OTHER): Payer: Medicare Other | Admitting: *Deleted

## 2015-05-05 DIAGNOSIS — Z5181 Encounter for therapeutic drug level monitoring: Secondary | ICD-10-CM

## 2015-05-05 LAB — POCT INR: INR: 2.4

## 2015-05-05 LAB — RENAL FUNCTION PANEL
Albumin: 4 g/dL (ref 3.5–5.2)
BUN: 14 mg/dL (ref 6–23)
CHLORIDE: 101 meq/L (ref 96–112)
CO2: 28 mEq/L (ref 19–32)
Calcium: 9.6 mg/dL (ref 8.4–10.5)
Creatinine, Ser: 0.81 mg/dL (ref 0.40–1.20)
GFR: 90.59 mL/min (ref >60.00)
GLUCOSE: 84 mg/dL (ref 70–99)
POTASSIUM: 3.9 meq/L (ref 3.5–5.1)
Phosphorus: 3.9 mg/dL (ref 2.3–4.6)
SODIUM: 139 meq/L (ref 135–145)

## 2015-05-05 NOTE — Progress Notes (Signed)
I have reviewed and agree with the plan. 

## 2015-05-05 NOTE — Progress Notes (Signed)
Pre visit review using our clinic review tool, if applicable. No additional management support is needed unless otherwise documented below in the visit note. 

## 2015-05-06 NOTE — Progress Notes (Signed)
Quick Note:  Please let patient know that the potassium is normal ______

## 2015-05-22 ENCOUNTER — Other Ambulatory Visit: Payer: Self-pay | Admitting: Endocrinology

## 2015-05-25 DIAGNOSIS — L814 Other melanin hyperpigmentation: Secondary | ICD-10-CM | POA: Diagnosis not present

## 2015-05-25 DIAGNOSIS — I872 Venous insufficiency (chronic) (peripheral): Secondary | ICD-10-CM | POA: Diagnosis not present

## 2015-05-25 DIAGNOSIS — D485 Neoplasm of uncertain behavior of skin: Secondary | ICD-10-CM | POA: Diagnosis not present

## 2015-05-25 DIAGNOSIS — L818 Other specified disorders of pigmentation: Secondary | ICD-10-CM | POA: Diagnosis not present

## 2015-05-25 DIAGNOSIS — L219 Seborrheic dermatitis, unspecified: Secondary | ICD-10-CM | POA: Diagnosis not present

## 2015-06-09 ENCOUNTER — Ambulatory Visit (INDEPENDENT_AMBULATORY_CARE_PROVIDER_SITE_OTHER): Payer: Medicare Other | Admitting: General Practice

## 2015-06-09 DIAGNOSIS — Z5181 Encounter for therapeutic drug level monitoring: Secondary | ICD-10-CM | POA: Diagnosis not present

## 2015-06-09 LAB — POCT INR: INR: 2.7

## 2015-06-09 NOTE — Progress Notes (Signed)
Pre visit review using our clinic review tool, if applicable. No additional management support is needed unless otherwise documented below in the visit note. 

## 2015-06-09 NOTE — Progress Notes (Signed)
I have reviewed and agree with the plan. 

## 2015-06-10 ENCOUNTER — Other Ambulatory Visit: Payer: Self-pay | Admitting: Endocrinology

## 2015-06-13 ENCOUNTER — Other Ambulatory Visit: Payer: Self-pay | Admitting: Endocrinology

## 2015-06-22 DIAGNOSIS — I872 Venous insufficiency (chronic) (peripheral): Secondary | ICD-10-CM | POA: Diagnosis not present

## 2015-06-22 DIAGNOSIS — L818 Other specified disorders of pigmentation: Secondary | ICD-10-CM | POA: Diagnosis not present

## 2015-07-01 ENCOUNTER — Other Ambulatory Visit: Payer: Self-pay | Admitting: Endocrinology

## 2015-07-14 ENCOUNTER — Other Ambulatory Visit: Payer: Self-pay | Admitting: *Deleted

## 2015-07-14 ENCOUNTER — Ambulatory Visit (INDEPENDENT_AMBULATORY_CARE_PROVIDER_SITE_OTHER): Payer: Medicare Other | Admitting: Ophthalmology

## 2015-07-14 ENCOUNTER — Other Ambulatory Visit (INDEPENDENT_AMBULATORY_CARE_PROVIDER_SITE_OTHER): Payer: Medicare Other

## 2015-07-14 DIAGNOSIS — E162 Hypoglycemia, unspecified: Secondary | ICD-10-CM | POA: Diagnosis not present

## 2015-07-14 DIAGNOSIS — E559 Vitamin D deficiency, unspecified: Secondary | ICD-10-CM

## 2015-07-14 DIAGNOSIS — E161 Other hypoglycemia: Secondary | ICD-10-CM

## 2015-07-14 LAB — VITAMIN D 25 HYDROXY (VIT D DEFICIENCY, FRACTURES): VITD: 36.78 ng/mL (ref 30.00–100.00)

## 2015-07-14 LAB — COMPREHENSIVE METABOLIC PANEL
ALK PHOS: 84 U/L (ref 39–117)
ALT: 32 U/L (ref 0–35)
AST: 28 U/L (ref 0–37)
Albumin: 3.6 g/dL (ref 3.5–5.2)
BILIRUBIN TOTAL: 0.5 mg/dL (ref 0.2–1.2)
BUN: 14 mg/dL (ref 6–23)
CO2: 29 meq/L (ref 19–32)
Calcium: 9.4 mg/dL (ref 8.4–10.5)
Chloride: 104 mEq/L (ref 96–112)
Creatinine, Ser: 0.84 mg/dL (ref 0.40–1.20)
GFR: 86.82 mL/min (ref >60.00)
GLUCOSE: 109 mg/dL — AB (ref 70–99)
Potassium: 4.2 mEq/L (ref 3.5–5.1)
SODIUM: 139 meq/L (ref 135–145)
TOTAL PROTEIN: 6.6 g/dL (ref 6.0–8.3)

## 2015-07-14 LAB — HEMOGLOBIN A1C: HEMOGLOBIN A1C: 6.5 % (ref 4.6–6.5)

## 2015-07-15 ENCOUNTER — Ambulatory Visit (INDEPENDENT_AMBULATORY_CARE_PROVIDER_SITE_OTHER): Payer: Medicare Other | Admitting: Ophthalmology

## 2015-07-15 ENCOUNTER — Other Ambulatory Visit: Payer: Medicare Other

## 2015-07-15 DIAGNOSIS — I10 Essential (primary) hypertension: Secondary | ICD-10-CM

## 2015-07-15 DIAGNOSIS — H35033 Hypertensive retinopathy, bilateral: Secondary | ICD-10-CM

## 2015-07-15 DIAGNOSIS — H348322 Tributary (branch) retinal vein occlusion, left eye, stable: Secondary | ICD-10-CM

## 2015-07-15 DIAGNOSIS — H35372 Puckering of macula, left eye: Secondary | ICD-10-CM | POA: Diagnosis not present

## 2015-07-15 DIAGNOSIS — H353211 Exudative age-related macular degeneration, right eye, with active choroidal neovascularization: Secondary | ICD-10-CM | POA: Diagnosis not present

## 2015-07-15 DIAGNOSIS — H353122 Nonexudative age-related macular degeneration, left eye, intermediate dry stage: Secondary | ICD-10-CM | POA: Diagnosis not present

## 2015-07-16 ENCOUNTER — Other Ambulatory Visit: Payer: Self-pay | Admitting: Endocrinology

## 2015-07-16 ENCOUNTER — Ambulatory Visit (INDEPENDENT_AMBULATORY_CARE_PROVIDER_SITE_OTHER): Payer: Medicare Other | Admitting: Ophthalmology

## 2015-07-20 ENCOUNTER — Ambulatory Visit (INDEPENDENT_AMBULATORY_CARE_PROVIDER_SITE_OTHER): Payer: Medicare Other | Admitting: Endocrinology

## 2015-07-20 ENCOUNTER — Encounter: Payer: Self-pay | Admitting: Endocrinology

## 2015-07-20 VITALS — BP 130/86 | HR 73 | Temp 97.9°F | Resp 14 | Ht 68.5 in | Wt 248.8 lb

## 2015-07-20 DIAGNOSIS — K911 Postgastric surgery syndromes: Secondary | ICD-10-CM | POA: Diagnosis not present

## 2015-07-20 DIAGNOSIS — E038 Other specified hypothyroidism: Secondary | ICD-10-CM

## 2015-07-20 DIAGNOSIS — E1142 Type 2 diabetes mellitus with diabetic polyneuropathy: Secondary | ICD-10-CM

## 2015-07-20 DIAGNOSIS — E063 Autoimmune thyroiditis: Secondary | ICD-10-CM

## 2015-07-20 NOTE — Patient Instructions (Addendum)
Check blood sugars on waking up 1-2  times a week Also check blood sugars about 2 hours after a meal and do this after different meals by rotation  Recommended blood sugar levels on waking up is 90-130 and about 2 hours after meal is 130-160  Please bring your blood sugar monitor to each visit, thank you  Take 100mg  Spironolactone Stop Robaxin

## 2015-07-20 NOTE — Progress Notes (Signed)
Patient ID: Kelli Martinez, female   DOB: [birth year], 69 y.o.   MRN: [id number]    Chief complaint: Followup of various issues   History of Present Illness:  The patient is seen for the following problems:  1. Postprandial hypoglycemia related to dumping syndrome   She has had postprandial hypoglycemia starting a couple of years after her gastric bypass surgery Her symptoms of blood sugars are mostly a feeling of significant weakness, some shakiness.  She will use fruits or juice in order to relieve the symptoms She has been treated with acarbose some improvement. She did try verapamil empirically but this did not help.  She is taking her Precose usually right before eating She given 2 tablets at breakfast and 1 at lunch and dinner    Recent history: she has had infrequent episodes of low blood sugars She thinks they have occurred occasionally before lunch and has 2 readings on her monitor in the 50s  She does not take her Precose before lunch, taking before breakfast and supper For breakfast she will have bacon or eggs in the morning usually, usually no carbohydrate and relate some protein in the afternoon only  She usually has a protein at lunch and dinner  She has been to the dietitian and has been instructed on balanced low-fat meals with enough protein consistently and restricting carbohydrates including fruits.  She thinks she is following the diet especially getting protein, occasionally has a very light breakfast with fruit only  Wt Readings from Last 3 Encounters:  07/20/15 248 lb 12.8 oz (112.855 kg)  04/28/15 248 lb (112.492 kg)  04/27/15 247 lb 3.2 oz (112.129 kg)   2.  History of diabetes:   Usually has a normal A1c readings around 6.2-6.6 Has not checked her blood sugars much recently except when she was having low sugars and has another fasting glucose of 99 Currently on Precose as above She is not as active, doing some bike exercises   Meal  portion size is usually well controlled and her weight is stable   Lab Results  Component Value Date   HGBA1C 6.5 07/14/2015   HGBA1C 6.2 12/30/2014   HGBA1C 6.6* 09/30/2014   Lab Results  Component Value Date   MICROALBUR <0.7 12/01/2014   LDLCALC 77 12/30/2014   CREATININE 0.84 07/14/2015      Other active problems: see review of systems   MUSCLE cramps:  She has had less muscle cramps  No etiology has been found She has tried baclofen, magnesium and is now doing better Because of a single level of calcium of 7.9 she was asked to take calcium supplements and she is taking 600 mg of calcium citrate now Her magnesium level has been quite normal and she has normal electrolytes      Medication List       This list is accurate as of: 07/20/15 10:27 AM.  Always use your most recent med list.               acarbose 25 MG tablet  Commonly known as:  PRECOSE  TAKE 1 TABLET THREE TIMES A DAY     albuterol 108 (90 BASE) MCG/ACT inhaler  Commonly known as:  PROVENTIL HFA;VENTOLIN HFA  Inhale 2 puffs into the lungs 4 (four) times daily as needed. As needed rescue inhaler     atorvastatin 20 MG tablet  Commonly known as:  LIPITOR  TAKE 1 TABLET DAILY     baclofen 10 MG  tablet  Commonly known as:  LIORESAL  Take 1 tablet 4 times daily     clindamycin 150 MG capsule  Commonly known as:  CLEOCIN  Take 2 capsules (300 mg total) by mouth 3 (three) times daily. May dispense as 15m capsules     ferrous sulfate 325 (65 FE) MG tablet  Take 325 mg by mouth daily.     fluticasone 50 MCG/ACT nasal spray  Commonly known as:  FLONASE  USE 2 SPRAYS IN EACH NOSTRIL DAILY     Fluticasone-Salmeterol 100-50 MCG/DOSE Aepb  Commonly known as:  ADVAIR DISKUS  USE 1 INHALATION EVERY 12 HOURS, RINSE MOUTH     freestyle lancets  Use as instructed to check blood sugar once a day dx code E11.65     FREESTYLE LITE Devi  Use to check blood sugar once a day DX code E11.65      FREESTYLE FREEDOM LITE W/DEVICE Kit  use as directed     furosemide 40 MG tablet  Commonly known as:  LASIX  TAKE 1 TABLET EVERY MORNING     gabapentin 300 MG capsule  Commonly known as:  NEURONTIN  TAKE 1 CAPSULE FOUR TIMES A DAY     glucose blood test strip  Commonly known as:  FREESTYLE LITE  Use as instructed to check blood sugar once a day dx code E11.65     HYDROcodone-acetaminophen 5-325 MG tablet  Commonly known as:  NORCO  Take 1 tablet by mouth 2 (two) times daily as needed for moderate pain.     levothyroxine 125 MCG tablet  Commonly known as:  SYNTHROID, LEVOTHROID  TAKE 1 TABLET DAILY BEFORE BREAKFAST     magnesium chloride 64 MG Tbec SR tablet  Commonly known as:  SLOW-MAG  Take 1 tablet (64 mg total) by mouth 2 (two) times daily.     methocarbamol 500 MG tablet  Commonly known as:  ROBAXIN  Take 1 tablet (500 mg total) by mouth 2 (two) times daily.     methylcellulose 1 % ophthalmic solution  Commonly known as:  ARTIFICIAL TEARS  Place 1 drop into both eyes as needed. Dry eyes     NON FORMULARY  CPAP 6 [facility]     omeprazole 20 MG capsule  Commonly known as:  PRILOSEC  TAKE 1 CAPSULE DAILY     rOPINIRole 1 MG tablet  Commonly known as:  REQUIP  Take 1 mg by mouth at bedtime.     spironolactone 50 MG tablet  Commonly known as:  ALDACTONE  TAKE 1 TABLET DAILY     temazepam 15 MG capsule  Commonly known as:  RESTORIL  1-2 for sleep as needed     triamcinolone cream 0.1 %  Commonly known as:  KENALOG  Apply 1 application topically 2 (two) times daily.     vitamin B-12 500 MCG tablet  Commonly known as:  CYANOCOBALAMIN  Take 500 mcg by mouth daily.     Vitamin D (Ergocalciferol) 50000 UNITS Caps capsule  Commonly known as:  DRISDOL  TAKE 1 CAPSULE EVERY FRIDAY     warfarin 5 MG tablet  Commonly known as:  COUMADIN  Take 2.5 mg by mouth daily. 12.536mTues,Thurs, Sat, Sun     warfarin 10 MG tablet  Commonly known as:  COUMADIN  One  tablet Mon, Wed, Fri     warfarin 5 MG tablet  Commonly known as:  COUMADIN  TAKE 2 TABLETS ONCE DAILY (THIS IS TO REPLACE THE 10  MG TABLET)        Allergies:  Allergies  Allergen Reactions  . Adhesive [Tape] Rash    Past Medical History  Diagnosis Date  . Allergy   . Asthma   . Pulmonary embolism ([place]) 1998 / 1994 /1968  . HX: breast cancer   . Blind right eye     hemorrhage  . Hypothyroid   . Clotting disorder ([place])   . Hypertension   . Blood transfusion   . Morbid obesity ([place])   . Syncope and collapse 2011    due to anemia  . GERD (gastroesophageal reflux disease)   . Hyperlipidemia   . Osteoporosis   . Psoriasis   . Gallstones   . Anemia   . Elevated liver enzymes   . Diabetes mellitus     hypoglycemia since large wt loss  . OSA (obstructive sleep apnea)     uses c pap  . Arthritis   . Cancer ([place])     breast 1994  . Lymphedema of arm     RT  . Back pain, chronic     GETS INJECTIONS IN BACK    Past Surgical History  Procedure Laterality Date  . Abdominal hysterectomy    . Mastectomy      Right  . Gastric bypass  2011    bariatric surgery  . Ivc filter      recurrent DVT  . Bone marrow transplant  1994  . Cardiovascular stress test  05/23/2005    EF 53%  . US echocardiography  10/27/08    EF 55-60%  . Knee arthrotomy  1998  . Carpal tunnell      bil  . Cataracts    . Hiatal hernia repair    . Cholecystectomy  05/10/2012    Procedure: LAPAROSCOPIC CHOLECYSTECTOMY WITH INTRAOPERATIVE CHOLANGIOGRAM;  Surgeon: Pedro Earls, MD;  Location: [facility];  Service: General;  Laterality: N/A;  Laparoscopic Cholecystectomy with Intraoperative Cholangiogram    Family History  Problem Relation Age of Onset  . Sudden death Mother     car accident  . Cancer Father 19    lung  . Cancer Sister 25    lung cancer  . Asthma Daughter     Social History:  reports that she quit smoking about 36 years ago. Her smoking use included Cigarettes. She has a 5  pack-year smoking history. She has never used smokeless tobacco. She reports that she does not drink alcohol or use illicit drugs.  Review of Systems    EDEMA: She has had problems with  edema both in her legs and right arm She thinks with trying to elevate her legs and Aldactone she has less edema, also has talked to her dermatologist Although she was told to take 100 mg of Aldactone she did not have enough and her prescription and is taking only 50  She has taken Lasix only occasionally This does cause cramps  Lymphedema right arm, secondary to  breast surgery, using  elastic compression stocking   History of mild hypothyroidism: Adequately replaced  with 62.5 mcg ,  has had normal TSH  Lab Results  Component Value Date   TSH 2.27 03/02/2015    Hypercholesterolemia: LDL has been at target with Lipitor and she is compliant with this. Taking this also for cardiovascular prophylaxis Recent levels:  Lab Results  Component Value Date   CHOL 184 12/30/2014   HDL 98.60 12/30/2014   LDLCALC 77 12/30/2014   TRIG 43.0 12/30/2014  CHOLHDL 2 12/30/2014    Previous history of hypertension: Currently not on any medications except diuretics.   She has had periodic epidural injections for sciatica and back pain, followed by orthopedic surgeon  She was given Requip by her orthopedic doctor for restless legs, mostly having symptoms in the left but also having various symptoms of tingling   LABS:  Lab on 07/14/2015  Component Date Value Ref Range Status  . Hgb A1c MFr Bld 07/14/2015 6.5  4.6 - 6.5 % Final   Glycemic Control Guidelines for People with Diabetes:Non Diabetic:  <6%Goal of Therapy: <7%Additional Action Suggested:  >8%   . Sodium 07/14/2015 139  135 - 145 mEq/L Final  . Potassium 07/14/2015 4.2  3.5 - 5.1 mEq/L Final  . Chloride 07/14/2015 104  96 - 112 mEq/L Final  . CO2 07/14/2015 29  19 - 32 mEq/L Final  . Glucose, Bld 07/14/2015 109* 70 - 99 mg/dL Final  . BUN  07/14/2015 14  6 - 23 mg/dL Final  . Creatinine, Ser 07/14/2015 0.84  0.40 - 1.20 mg/dL Final  . Total Bilirubin 07/14/2015 0.5  0.2 - 1.2 mg/dL Final  . Alkaline Phosphatase 07/14/2015 84  39 - 117 U/L Final  . AST 07/14/2015 28  0 - 37 U/L Final  . ALT 07/14/2015 32  0 - 35 U/L Final  . Total Protein 07/14/2015 6.6  6.0 - 8.3 g/dL Final  . Albumin 07/14/2015 3.6  3.5 - 5.2 g/dL Final  . Calcium 07/14/2015 9.4  8.4 - 10.5 mg/dL Final  . GFR 07/14/2015 86.82  >60.00 mL/min Final  . VITD 07/14/2015 36.78  30.00 - 100.00 ng/mL Final     EXAM:  BP 130/86 mmHg  Pulse 73  Temp(Src) 97.9 F (36.6 C)  Resp 14  Ht 5' 8.5" (1.74 m)  Wt 248 lb 12.8 oz (112.855 kg)  BMI 37.28 kg/m2  SpO2 98%    Repeat blood pressure standing with large cuff 120/82  Mild to moderate edema of legs, mostly on the left leg  Assessment/Plan:   1. Reactive/postprandial hypoglycemia from dumping syndrome.  She has periodic symptoms although not as frequent or severe, still continuing despite having relatively higher protein and low carbohydrate diet  She will continue her Precose unchanged  Continue balanced meals with protein and be consistent with snack midmorning   2.  MUSCLE cramps: These appear to be  improving with using baclofen Advised her not to take Robaxin at the same time Also continue calcium supplements and gabapentin   3. Mild diabetes: A1c Is high normal and discussed how this correlates with blood sugars and needs to start monitoring blood sugars more consistently routinely especially after meals  4.  Liver functions are back to normal , etiology unclear as to why they were higher previously    5.  EDEMA of the legs: This is likely to be from venous insufficiency and she needs to start using the elastic stockings. Will again try 100 mg of Aldactone and continue elevation and exercise    Dusten Ellinwood 07/20/2015, 10:27 AM

## 2015-07-21 ENCOUNTER — Ambulatory Visit (INDEPENDENT_AMBULATORY_CARE_PROVIDER_SITE_OTHER): Payer: Medicare Other | Admitting: General Practice

## 2015-07-21 DIAGNOSIS — Z5181 Encounter for therapeutic drug level monitoring: Secondary | ICD-10-CM

## 2015-07-21 LAB — POCT INR: INR: 2.5

## 2015-07-21 NOTE — Progress Notes (Signed)
I have reviewed and agree with the plan. 

## 2015-07-21 NOTE — Progress Notes (Signed)
Pre visit review using our clinic review tool, if applicable. No additional management support is needed unless otherwise documented below in the visit note. 

## 2015-07-22 ENCOUNTER — Other Ambulatory Visit: Payer: Self-pay | Admitting: Endocrinology

## 2015-07-29 ENCOUNTER — Encounter: Payer: Self-pay | Admitting: *Deleted

## 2015-08-06 ENCOUNTER — Other Ambulatory Visit: Payer: Self-pay | Admitting: Endocrinology

## 2015-08-18 ENCOUNTER — Ambulatory Visit (INDEPENDENT_AMBULATORY_CARE_PROVIDER_SITE_OTHER): Payer: Medicare Other | Admitting: General Practice

## 2015-08-18 DIAGNOSIS — Z5181 Encounter for therapeutic drug level monitoring: Secondary | ICD-10-CM | POA: Diagnosis not present

## 2015-08-18 LAB — POCT INR: INR: 2.8

## 2015-08-18 NOTE — Progress Notes (Signed)
Pre visit review using our clinic review tool, if applicable. No additional management support is needed unless otherwise documented below in the visit note. 

## 2015-08-18 NOTE — Progress Notes (Signed)
I have reviewed and agree with the plan. 

## 2015-09-23 ENCOUNTER — Other Ambulatory Visit: Payer: Self-pay | Admitting: Endocrinology

## 2015-09-24 ENCOUNTER — Telehealth: Payer: Self-pay | Admitting: Endocrinology

## 2015-09-24 NOTE — Telephone Encounter (Signed)
Can you please call pt. She wants to reschedule her and her sons appt and I thought you could help her better.  Thank you

## 2015-09-24 NOTE — Telephone Encounter (Signed)
Contacted patient.

## 2015-09-29 ENCOUNTER — Ambulatory Visit (INDEPENDENT_AMBULATORY_CARE_PROVIDER_SITE_OTHER): Payer: Medicare Other | Admitting: General Practice

## 2015-09-29 ENCOUNTER — Ambulatory Visit: Payer: Medicare Other

## 2015-09-29 DIAGNOSIS — Z5181 Encounter for therapeutic drug level monitoring: Secondary | ICD-10-CM | POA: Diagnosis not present

## 2015-09-29 LAB — POCT INR: INR: 3.5

## 2015-09-29 NOTE — Progress Notes (Signed)
I have reviewed and agree with the plan. 

## 2015-09-29 NOTE — Progress Notes (Signed)
Pre visit review using our clinic review tool, if applicable. No additional management support is needed unless otherwise documented below in the visit note. 

## 2015-10-01 ENCOUNTER — Other Ambulatory Visit: Payer: Self-pay | Admitting: Endocrinology

## 2015-10-12 ENCOUNTER — Other Ambulatory Visit: Payer: Self-pay | Admitting: Endocrinology

## 2015-10-14 ENCOUNTER — Other Ambulatory Visit (INDEPENDENT_AMBULATORY_CARE_PROVIDER_SITE_OTHER): Payer: Medicare Other

## 2015-10-14 DIAGNOSIS — E1142 Type 2 diabetes mellitus with diabetic polyneuropathy: Secondary | ICD-10-CM

## 2015-10-14 DIAGNOSIS — E038 Other specified hypothyroidism: Secondary | ICD-10-CM

## 2015-10-14 DIAGNOSIS — E063 Autoimmune thyroiditis: Secondary | ICD-10-CM

## 2015-10-14 LAB — T4, FREE: FREE T4: 1.02 ng/dL (ref 0.60–1.60)

## 2015-10-14 LAB — BASIC METABOLIC PANEL
BUN: 14 mg/dL (ref 6–23)
CO2: 30 mEq/L (ref 19–32)
Calcium: 9.7 mg/dL (ref 8.4–10.5)
Chloride: 101 mEq/L (ref 96–112)
Creatinine, Ser: 0.81 mg/dL (ref 0.40–1.20)
GFR: 90.47 mL/min (ref >60.00)
GLUCOSE: 124 mg/dL — AB (ref 70–99)
POTASSIUM: 4.5 meq/L (ref 3.5–5.1)
SODIUM: 137 meq/L (ref 135–145)

## 2015-10-14 LAB — TSH: TSH: 1.25 u[IU]/mL (ref 0.35–4.50)

## 2015-10-19 ENCOUNTER — Ambulatory Visit: Payer: Medicare Other | Admitting: Endocrinology

## 2015-10-20 ENCOUNTER — Encounter: Payer: Self-pay | Admitting: Endocrinology

## 2015-10-20 ENCOUNTER — Other Ambulatory Visit: Payer: Self-pay | Admitting: *Deleted

## 2015-10-20 ENCOUNTER — Ambulatory Visit (INDEPENDENT_AMBULATORY_CARE_PROVIDER_SITE_OTHER): Payer: Medicare Other | Admitting: Endocrinology

## 2015-10-20 VITALS — BP 128/84 | HR 72 | Temp 98.1°F | Resp 16 | Ht 68.5 in | Wt 241.2 lb

## 2015-10-20 DIAGNOSIS — E162 Hypoglycemia, unspecified: Secondary | ICD-10-CM | POA: Diagnosis not present

## 2015-10-20 DIAGNOSIS — Z853 Personal history of malignant neoplasm of breast: Secondary | ICD-10-CM | POA: Diagnosis not present

## 2015-10-20 DIAGNOSIS — E161 Other hypoglycemia: Secondary | ICD-10-CM

## 2015-10-20 DIAGNOSIS — R7301 Impaired fasting glucose: Secondary | ICD-10-CM

## 2015-10-20 DIAGNOSIS — Z1239 Encounter for other screening for malignant neoplasm of breast: Secondary | ICD-10-CM | POA: Diagnosis not present

## 2015-10-20 DIAGNOSIS — E1142 Type 2 diabetes mellitus with diabetic polyneuropathy: Secondary | ICD-10-CM

## 2015-10-20 LAB — POCT GLYCOSYLATED HEMOGLOBIN (HGB A1C): HEMOGLOBIN A1C: 6.6

## 2015-10-20 MED ORDER — SPIRONOLACTONE 50 MG PO TABS: 50.0000 mg | ORAL_TABLET | Freq: Every day | ORAL | Status: DC

## 2015-10-20 MED ORDER — HYDROCODONE-ACETAMINOPHEN 5-325 MG PO TABS: 1.0000 | ORAL_TABLET | Freq: Two times a day (BID) | ORAL | Status: DC | PRN

## 2015-10-20 NOTE — Patient Instructions (Signed)
Take fluid pill early am and late afternoon

## 2015-10-20 NOTE — Progress Notes (Signed)
Patient ID: Kelli Martinez, female   DOB: [birth year], 70 y.o.   MRN: [id number]    Chief complaint: Followup of various issues   History of Present Illness:  The patient is seen for the following problems:  1. Postprandial hypoglycemia related to dumping syndrome   She has had postprandial hypoglycemia starting a couple of years after her gastric bypass surgery Her symptoms of blood sugars are mostly a feeling of significant weakness, some shakiness.  She will use fruits or juice in order to relieve the symptoms She has been treated with acarbose improvement. She did try verapamil empirically but this did not help.   She is taking her Precose usually right before eating She given 2 tablets at breakfast and 1 at dinner    Recent history: she has had infrequent episodes of low blood sugars, about 2 recently They have occurred occasionally before lunch, no protein that morning on one of the episodes when she had cereal  For breakfast she will have bacon or eggs in the morning usually with fruit and occasionally cereal   She usually has a protein at lunch and dinner She is asking about drinking smoothies with a lot of greens  She has been to the dietitian and has been instructed on balanced low-fat meals with enough protein consistently and restricting carbohydrates including fruits.  She thinks she is following the diet especially getting protein   Wt Readings from Last 3 Encounters:  10/20/15 241 lb 3.2 oz (109.408 kg)  07/20/15 248 lb 12.8 oz (112.855 kg)  04/28/15 248 lb (112.492 kg)   2.  History of diabetes:   Usually has a normal A1c readings around 6.2-6.6 Has not checked her blood sugars much recently  RECENT glucose range 85-104 but checking mostly in the mornings or once at suppertime Currently on Precose as above She is starting to be more active, doing more exercises   Meal portion size is usually well controlled and her weight is improved on this  visit   Lab Results  Component Value Date   HGBA1C 6.6 10/20/2015   HGBA1C 6.5 07/14/2015   HGBA1C 6.2 12/30/2014   Lab Results  Component Value Date   MICROALBUR <0.7 12/01/2014   LDLCALC 77 12/30/2014   CREATININE 0.81 10/14/2015      Other active problems: see review of systems       Medication List       This list is accurate as of: 10/20/15 12:35 PM.  Always use your most recent med list.               acarbose 25 MG tablet  Commonly known as:  PRECOSE  TAKE 1 TABLET THREE TIMES A DAY     albuterol 108 (90 Base) MCG/ACT inhaler  Commonly known as:  PROVENTIL HFA;VENTOLIN HFA  Inhale 2 puffs into the lungs 4 (four) times daily as needed. As needed rescue inhaler     atorvastatin 20 MG tablet  Commonly known as:  LIPITOR  TAKE 1 TABLET DAILY     baclofen 10 MG tablet  Commonly known as:  LIORESAL  Take 1 tablet 4 times daily     ferrous sulfate 325 (65 FE) MG tablet  Take 325 mg by mouth daily.     fluticasone 50 MCG/ACT nasal spray  Commonly known as:  FLONASE  USE 2 SPRAYS IN EACH NOSTRIL DAILY     Fluticasone-Salmeterol 100-50 MCG/DOSE Aepb  Commonly known as:  ADVAIR DISKUS  USE  1 INHALATION EVERY 12 HOURS, RINSE MOUTH     freestyle lancets  USE AS INSTRUCTED TO CHECK BLOOD SUGAR ONCE DAILY     FREESTYLE LITE Devi  Use to check blood sugar once a day DX code E11.65     FREESTYLE FREEDOM LITE w/Device Kit  use as directed     FREESTYLE LITE test strip  Generic drug:  glucose blood  USE AS INSTRUCTED TO CHECK BLOOD SUGAR ONCE DAILY     furosemide 40 MG tablet  Commonly known as:  LASIX  TAKE 1 TABLET EVERY MORNING     gabapentin 300 MG capsule  Commonly known as:  NEURONTIN  TAKE 1 CAPSULE FOUR TIMES A DAY     HYDROcodone-acetaminophen 5-325 MG tablet  Commonly known as:  NORCO  Take 1 tablet by mouth 2 (two) times daily as needed for moderate pain.     KLOR-CON M20 20 MEQ tablet  Generic drug:  potassium chloride SA  TAKE 1  TABLET DAILY     levothyroxine 125 MCG tablet  Commonly known as:  SYNTHROID, LEVOTHROID  TAKE 1 TABLET DAILY BEFORE BREAKFAST     magnesium chloride 64 MG Tbec SR tablet  Commonly known as:  SLOW-MAG  Take 1 tablet (64 mg total) by mouth 2 (two) times daily.     methylcellulose 1 % ophthalmic solution  Commonly known as:  ARTIFICIAL TEARS  Place 1 drop into both eyes as needed. Dry eyes     NON FORMULARY  CPAP 6 [employer]     omeprazole 20 MG capsule  Commonly known as:  PRILOSEC  TAKE 1 CAPSULE DAILY     rOPINIRole 1 MG tablet  Commonly known as:  REQUIP  Take 1 mg by mouth at bedtime.     spironolactone 50 MG tablet  Commonly known as:  ALDACTONE  Take 1 tablet (50 mg total) by mouth daily. Take 1 tablet two times daily     temazepam 15 MG capsule  Commonly known as:  RESTORIL  1-2 for sleep as needed     triamcinolone cream 0.1 %  Commonly known as:  KENALOG  Apply 1 application topically 2 (two) times daily.     vitamin B-12 500 MCG tablet  Commonly known as:  CYANOCOBALAMIN  Take 500 mcg by mouth daily.     Vitamin D (Ergocalciferol) 50000 units Caps capsule  Commonly known as:  DRISDOL  TAKE 1 CAPSULE EVERY FRIDAY     warfarin 5 MG tablet  Commonly known as:  COUMADIN  Take 2.5 mg by mouth daily. 12.63m Tues,Thurs, Sat, Sun     warfarin 10 MG tablet  Commonly known as:  COUMADIN  One tablet Mon, Wed, Fri        Allergies:  Allergies  Allergen Reactions  . Adhesive [Tape] Rash    Past Medical History  Diagnosis Date  . Allergy   . Asthma   . Pulmonary embolism ([facility] 1998 / 1994 [place]1968  . HX: breast cancer   . Blind right eye     hemorrhage  . Hypothyroid   . Clotting disorder ([facility]   . Hypertension   . Blood transfusion   . Morbid obesity ([facility]   . Syncope and collapse 2011    due to anemia  . GERD (gastroesophageal reflux disease)   . Hyperlipidemia   . Osteoporosis   . Psoriasis   . Gallstones   . Anemia   . Elevated liver  enzymes   . Diabetes mellitus  hypoglycemia since large wt loss  . OSA (obstructive sleep apnea)     uses c pap  . Arthritis   . Cancer ([place])     breast 1994  . Lymphedema of arm     RT  . Back pain, chronic     GETS INJECTIONS IN BACK    Past Surgical History  Procedure Laterality Date  . Abdominal hysterectomy    . Mastectomy      Right  . Gastric bypass  2011    bariatric surgery  . Ivc filter      recurrent DVT  . Bone marrow transplant  1994  . Cardiovascular stress test  05/23/2005    EF 53%  . US echocardiography  10/27/08    EF 55-60%  . Knee arthrotomy  1998  . Carpal tunnell      bil  . Cataracts    . Hiatal hernia repair    . Cholecystectomy  05/10/2012    Procedure: LAPAROSCOPIC CHOLECYSTECTOMY WITH INTRAOPERATIVE CHOLANGIOGRAM;  Surgeon: Pedro Earls, MD;  Location: [facility];  Service: General;  Laterality: N/A;  Laparoscopic Cholecystectomy with Intraoperative Cholangiogram    Family History  Problem Relation Age of Onset  . Sudden death Mother     car accident  . Cancer Father 51    lung  . Cancer Sister 53    lung cancer  . Asthma Daughter     Social History:  reports that she quit smoking about 37 years ago. Her smoking use included Cigarettes. She has a 5 pack-year smoking history. She has never used smokeless tobacco. She reports that she does not drink alcohol or use illicit drugs.  Review of Systems    EDEMA: She has had problems with  edema both in her legs and right arm She thinks with trying to elevate her legs and Aldactone she has less edema, also has talked to her dermatologist On her last visit she was told to take 100 mg of Aldactone  With this her edema is  improving  She has taken Lasix only occasionally, less recently She has not started using the impression stockings as prescribed and did not get the prescription filled  Lymphedema right arm, secondary to  breast surgery, using  elastic compression stocking   History  of mild hypothyroidism: Adequately replaced  with 62.5 mcg ,  has had normal TSH Recently  Lab Results  Component Value Date   TSH 1.25 10/14/2015    Hypercholesterolemia: LDL has been at target with Lipitor and she is compliant with this.  Taking this also for cardiovascular prophylaxis Recent levels:  Lab Results  Component Value Date   CHOL 184 12/30/2014   HDL 98.60 12/30/2014   LDLCALC 77 12/30/2014   TRIG 43.0 12/30/2014   CHOLHDL 2 12/30/2014    Previous history of hypertension: Currently not on any medications except diuretics.   She has had periodic epidural injections for sciatica and back pain, followed by orthopedic surgeon, Eyes having some back pain recently also  She was given Requip by her orthopedic doctor for restless legs, mostly having symptoms in the left but also having various symptoms of tingling   LABS:  Office Visit on 10/20/2015  Component Date Value Ref Range Status  . Hemoglobin A1C 10/20/2015 6.6   Final  Lab on 10/14/2015  Component Date Value Ref Range Status  . Sodium 10/14/2015 137  135 - 145 mEq/L Final  . Potassium 10/14/2015 4.5  3.5 - 5.1 mEq/L Final  .  Chloride 10/14/2015 101  96 - 112 mEq/L Final  . CO2 10/14/2015 30  19 - 32 mEq/L Final  . Glucose, Bld 10/14/2015 124* 70 - 99 mg/dL Final  . BUN 10/14/2015 14  6 - 23 mg/dL Final  . Creatinine, Ser 10/14/2015 0.81  0.40 - 1.20 mg/dL Final  . Calcium 10/14/2015 9.7  8.4 - 10.5 mg/dL Final  . GFR 10/14/2015 90.47  >60.00 mL/min Final  . Free T4 10/14/2015 1.02  0.60 - 1.60 ng/dL Final  . TSH 10/14/2015 1.25  0.35 - 4.50 uIU/mL Final  Anti-coag visit on 09/29/2015  Component Date Value Ref Range Status  . INR 09/29/2015 3.5   Final     EXAM:  BP 128/84 mmHg  Pulse 72  Temp(Src) 98.1 F (36.7 C)  Resp 16  Ht 5' 8.5" (1.74 m)  Wt 241 lb 3.2 oz (109.408 kg)  BMI 36.14 kg/m2  SpO2 98%    Repeat blood pressure standing with large cuff 120/82  Mild to moderate edema of  legs, mostly on the left leg  Assessment/Plan:   1. Reactive/postprandial hypoglycemia from dumping syndrome.  She has periodic symptoms, Now less frequent and only occasionally before lunch when she does not have enough protein This was discussed  She will continue her Precose unchanged  Continue balanced meals with protein especially at breakfast   2.  Preventive care: She is overdue for mammogram and will order this  3. Mild diabetes: A1c Is high normal but her blood sugars are usually not high Is losing weight Encouraged her to be more active and eat balanced meals Encouraged her not to start using smoothies with greens because of her Coumadin  4.  Liver functions will need to be rechecked  5.  EDEMA of the legs: This is likely to be from venous insufficiency Will again keep her on 100 mg of Aldactone and continue leg elevation and exercise    Kennard Fildes 10/20/2015, 12:35 PM

## 2015-10-21 ENCOUNTER — Other Ambulatory Visit: Payer: Self-pay | Admitting: Endocrinology

## 2015-10-21 DIAGNOSIS — Z9011 Acquired absence of right breast and nipple: Secondary | ICD-10-CM

## 2015-10-21 DIAGNOSIS — Z1231 Encounter for screening mammogram for malignant neoplasm of breast: Secondary | ICD-10-CM

## 2015-10-29 ENCOUNTER — Ambulatory Visit: Payer: Medicare Other

## 2015-11-03 DIAGNOSIS — M170 Bilateral primary osteoarthritis of knee: Secondary | ICD-10-CM | POA: Diagnosis not present

## 2015-11-03 DIAGNOSIS — M25562 Pain in left knee: Secondary | ICD-10-CM | POA: Diagnosis not present

## 2015-11-05 ENCOUNTER — Ambulatory Visit: Payer: Medicare Other

## 2015-11-10 ENCOUNTER — Ambulatory Visit (INDEPENDENT_AMBULATORY_CARE_PROVIDER_SITE_OTHER): Payer: Medicare Other | Admitting: General Practice

## 2015-11-10 DIAGNOSIS — Z5181 Encounter for therapeutic drug level monitoring: Secondary | ICD-10-CM

## 2015-11-10 LAB — POCT INR: INR: 4

## 2015-11-10 NOTE — Progress Notes (Signed)
Pre visit review using our clinic review tool, if applicable. No additional management support is needed unless otherwise documented below in the visit note. 

## 2015-11-17 ENCOUNTER — Ambulatory Visit
Admission: RE | Admit: 2015-11-17 | Discharge: 2015-11-17 | Disposition: A | Discharge status: Home / Self Care | Payer: Medicare Other | Source: Ambulatory Visit | Attending: Endocrinology | Admitting: Endocrinology

## 2015-11-17 DIAGNOSIS — Z1231 Encounter for screening mammogram for malignant neoplasm of breast: Secondary | ICD-10-CM | POA: Diagnosis not present

## 2015-11-17 DIAGNOSIS — Z9011 Acquired absence of right breast and nipple: Secondary | ICD-10-CM

## 2015-11-18 ENCOUNTER — Other Ambulatory Visit: Payer: Self-pay | Admitting: Endocrinology

## 2015-12-05 ENCOUNTER — Other Ambulatory Visit: Payer: Self-pay | Admitting: Endocrinology

## 2015-12-08 ENCOUNTER — Ambulatory Visit: Payer: Medicare Other

## 2015-12-10 ENCOUNTER — Ambulatory Visit (INDEPENDENT_AMBULATORY_CARE_PROVIDER_SITE_OTHER): Payer: Medicare Other | Admitting: General Practice

## 2015-12-10 DIAGNOSIS — Z5181 Encounter for therapeutic drug level monitoring: Secondary | ICD-10-CM

## 2015-12-10 LAB — POCT INR: INR: 3.1

## 2015-12-10 NOTE — Progress Notes (Signed)
Pre visit review using our clinic review tool, if applicable. No additional management support is needed unless otherwise documented below in the visit note. 

## 2015-12-10 NOTE — Progress Notes (Signed)
I have reviewed and agree with the plan. 

## 2015-12-20 ENCOUNTER — Other Ambulatory Visit: Payer: Self-pay | Admitting: Endocrinology

## 2015-12-26 ENCOUNTER — Other Ambulatory Visit: Payer: Self-pay | Admitting: Endocrinology

## 2015-12-28 ENCOUNTER — Other Ambulatory Visit: Payer: Self-pay | Admitting: *Deleted

## 2015-12-28 ENCOUNTER — Telehealth: Payer: Self-pay | Admitting: Endocrinology

## 2015-12-28 MED ORDER — BACLOFEN 10 MG PO TABS: ORAL_TABLET | ORAL | Status: DC

## 2015-12-28 NOTE — Telephone Encounter (Signed)
Pt needs baclofen tabs called to [facility] on [address]

## 2015-12-28 NOTE — Telephone Encounter (Signed)
Rx sent 

## 2015-12-30 ENCOUNTER — Other Ambulatory Visit: Payer: Self-pay | Admitting: Internal Medicine

## 2015-12-31 ENCOUNTER — Other Ambulatory Visit: Payer: Self-pay | Admitting: *Deleted

## 2015-12-31 MED ORDER — BACLOFEN 10 MG PO TABS: ORAL_TABLET | ORAL | Status: DC

## 2016-01-07 ENCOUNTER — Ambulatory Visit (INDEPENDENT_AMBULATORY_CARE_PROVIDER_SITE_OTHER): Payer: Medicare Other | Admitting: General Practice

## 2016-01-07 DIAGNOSIS — Z5181 Encounter for therapeutic drug level monitoring: Secondary | ICD-10-CM

## 2016-01-07 LAB — POCT INR: INR: 3.4

## 2016-01-07 NOTE — Progress Notes (Signed)
Pre visit review using our clinic review tool, if applicable. No additional management support is needed unless otherwise documented below in the visit note. 

## 2016-01-07 NOTE — Progress Notes (Signed)
I have reviewed and agree with the plan. 

## 2016-01-18 ENCOUNTER — Other Ambulatory Visit: Payer: Self-pay | Admitting: Endocrinology

## 2016-01-19 ENCOUNTER — Other Ambulatory Visit (HOSPITAL_BASED_OUTPATIENT_CLINIC_OR_DEPARTMENT_OTHER): Payer: Medicare Other

## 2016-01-19 ENCOUNTER — Other Ambulatory Visit: Payer: Medicare Other

## 2016-01-19 ENCOUNTER — Ambulatory Visit (HOSPITAL_BASED_OUTPATIENT_CLINIC_OR_DEPARTMENT_OTHER): Payer: Medicare Other | Admitting: Oncology

## 2016-01-19 VITALS — BP 112/67 | HR 68 | Temp 98.3°F | Resp 17 | Ht 67.5 in | Wt 230.3 lb

## 2016-01-19 DIAGNOSIS — Z853 Personal history of malignant neoplasm of breast: Secondary | ICD-10-CM | POA: Diagnosis not present

## 2016-01-19 DIAGNOSIS — Z86718 Personal history of other venous thrombosis and embolism: Secondary | ICD-10-CM | POA: Diagnosis not present

## 2016-01-19 DIAGNOSIS — D509 Iron deficiency anemia, unspecified: Secondary | ICD-10-CM

## 2016-01-19 DIAGNOSIS — D500 Iron deficiency anemia secondary to blood loss (chronic): Secondary | ICD-10-CM

## 2016-01-19 LAB — COMPREHENSIVE METABOLIC PANEL
ALT: 25 U/L (ref 0–55)
ANION GAP: 8 meq/L (ref 3–11)
AST: 22 U/L (ref 5–34)
Albumin: 3.5 g/dL (ref 3.5–5.0)
Alkaline Phosphatase: 78 U/L (ref 40–150)
BUN: 15.4 mg/dL (ref 7.0–26.0)
CALCIUM: 9.5 mg/dL (ref 8.4–10.4)
CHLORIDE: 108 meq/L (ref 98–109)
CO2: 25 meq/L (ref 22–29)
CREATININE: 0.9 mg/dL (ref 0.6–1.1)
EGFR: 75 mL/min/{1.73_m2} — AB (ref >90)
Glucose: 104 mg/dl (ref 70–140)
POTASSIUM: 4.2 meq/L (ref 3.5–5.1)
Sodium: 141 mEq/L (ref 136–145)
Total Bilirubin: 0.33 mg/dL (ref 0.20–1.20)
Total Protein: 7 g/dL (ref 6.4–8.3)

## 2016-01-19 LAB — IRON AND TIBC
%SAT: 24 % (ref 21–57)
IRON: 78 ug/dL (ref 41–142)
TIBC: 324 ug/dL (ref 236–444)
UIBC: 246 ug/dL (ref 120–384)

## 2016-01-19 LAB — FERRITIN: FERRITIN: 854 ng/mL — AB (ref 9–269)

## 2016-01-19 LAB — CBC WITH DIFFERENTIAL/PLATELET
BASO%: 1.3 % (ref 0.0–2.0)
Basophils Absolute: 0.1 10*3/uL (ref 0.0–0.1)
EOS%: 2 % (ref 0.0–7.0)
Eosinophils Absolute: 0.1 10*3/uL (ref 0.0–0.5)
HEMATOCRIT: 37.7 % (ref 34.8–46.6)
HGB: 12.1 g/dL (ref 11.6–15.9)
LYMPH#: 1.2 10*3/uL (ref 0.9–3.3)
LYMPH%: 29.7 % (ref 14.0–49.7)
MCH: 28.6 pg (ref 25.1–34.0)
MCHC: 32.1 g/dL (ref 31.5–36.0)
MCV: 89 fL (ref 79.5–101.0)
MONO#: 0.2 10*3/uL (ref 0.1–0.9)
MONO%: 5.8 % (ref 0.0–14.0)
NEUT%: 61.2 % (ref 38.4–76.8)
NEUTROS ABS: 2.5 10*3/uL (ref 1.5–6.5)
PLATELETS: 184 10*3/uL (ref 145–400)
RBC: 4.24 10*6/uL (ref 3.70–5.45)
RDW: 14 % (ref 11.2–14.5)
WBC: 4.1 10*3/uL (ref 3.9–10.3)

## 2016-01-19 NOTE — Progress Notes (Signed)
Hematology and Oncology Follow Up Visit  Kelli Martinez [id number] 1945/11/07 70 y.o. 01/19/2016 9:37 AM   Principle Diagnosis:  70 year old female with the following issues:  1. Multifactorial anemia.  She has an element of iron deficiency, as well as anemia of renal disease, currently on p.o. iron replacement. No IV iron needed at this time. 2. History of breast cancer diagnosed in 1993. She is status post mastectomy followed by adjuvant radiation therapy.  She had also received high-dose chemotherapy and stem-cell transplantation in 1994.  She continues to be in remission from that standpoint.   3. History of gastric bypass surgery done in April 2011. 4. Status post endoscopy on 11/01/2009 and showed a small nodule biopsy-proven to be GIST.   Interim History: Kelli Martinez presents today for a followup visit. Since the last visit, she reports no major changes in her health. She continues to live independently and attends activities of daily living. She exercises occasionally although she does report periodic fatigue. Her appetite appear reasonable and her weight is relatively stable. She does report occasional lower action of any edema which is not dramatically changed. She denied any recent hospitalization or illnesses. She denied any respiratory distress. Her abdominal pain appears to have resolved at this time.  She does not report any headaches, blurry vision, syncope or seizures. He does not report any fevers, chills or sweats. She does not report any chest pain; does not report any difficulty breathing. Does not report any chest wall masses or discomfort. She does not report any nausea, vomiting or change in her bowel habits. He does not report any urinary symptoms. Rest of her review of systems unremarkable.   Medications: I have reviewed the patient's current medications.  Current Outpatient Prescriptions  Medication Sig Dispense Refill  . acarbose (PRECOSE) 25 MG tablet TAKE 1  TABLET THREE TIMES A DAY 270 tablet 1  . albuterol (PROVENTIL HFA;VENTOLIN HFA) 108 (90 BASE) MCG/ACT inhaler Inhale 2 puffs into the lungs 4 (four) times daily as needed. As needed rescue inhaler 3 Inhaler 3  . atorvastatin (LIPITOR) 20 MG tablet TAKE 1 TABLET DAILY (Patient taking differently: patient takes 1/2 tablet every other day) 90 tablet 0  . baclofen (LIORESAL) 10 MG tablet Take 1 tablet 4 times daily 360 each 1  . Blood Glucose Monitoring Suppl (FREESTYLE FREEDOM LITE) W/DEVICE KIT use as directed 1 each 0  . Blood Glucose Monitoring Suppl (FREESTYLE LITE) DEVI Use to check blood sugar once a day DX code E11.65 1 each 0  . ferrous sulfate 325 (65 FE) MG tablet Take 325 mg by mouth daily.      . fluticasone (FLONASE) 50 MCG/ACT nasal spray USE 2 SPRAYS IN EACH NOSTRIL DAILY 48 g 1  . Fluticasone-Salmeterol (ADVAIR DISKUS) 100-50 MCG/DOSE AEPB USE 1 INHALATION EVERY 12 HOURS, RINSE MOUTH 180 each 2  . FREESTYLE LITE test strip USE AS INSTRUCTED TO CHECK BLOOD SUGAR ONCE DAILY 100 each 1  . furosemide (LASIX) 40 MG tablet TAKE 1 TABLET EVERY MORNING 90 tablet 1  . gabapentin (NEURONTIN) 300 MG capsule TAKE 1 CAPSULE FOUR TIMES A DAY 360 capsule 0  . HYDROcodone-acetaminophen (NORCO) 5-325 MG tablet Take 1 tablet by mouth 2 (two) times daily as needed for moderate pain. 60 tablet 0  . KLOR-CON M20 20 MEQ tablet TAKE 1 TABLET DAILY 90 tablet 0  . Lancets (FREESTYLE) lancets USE AS INSTRUCTED TO CHECK BLOOD SUGAR ONCE DAILY 100 each 1  . levothyroxine (SYNTHROID, LEVOTHROID) 125  MCG tablet TAKE 1 TABLET DAILY BEFORE BREAKFAST 90 tablet 1  . magnesium chloride (SLOW-MAG) 64 MG TBEC SR tablet Take 1 tablet (64 mg total) by mouth 2 (two) times daily. 60 tablet 6  . methylcellulose (ARTIFICIAL TEARS) 1 % ophthalmic solution Place 1 drop into both eyes as needed. Dry eyes    . NON FORMULARY CPAP 6 [employer]    . omeprazole (PRILOSEC) 20 MG capsule TAKE 1 CAPSULE DAILY 90 capsule 0  . rOPINIRole  (REQUIP) 1 MG tablet Take 1 mg by mouth at bedtime.  0  . spironolactone (ALDACTONE) 50 MG tablet Take 1 tablet (50 mg total) by mouth daily. Take 1 tablet two times daily 180 tablet 1  . temazepam (RESTORIL) 15 MG capsule 1-2 for sleep as needed 30 capsule 1  . triamcinolone cream (KENALOG) 0.1 % Apply 1 application topically 2 (two) times daily. 30 g 1  . vitamin B-12 (CYANOCOBALAMIN) 500 MCG tablet Take 500 mcg by mouth daily.      . Vitamin D, Ergocalciferol, (DRISDOL) 50000 UNITS CAPS capsule TAKE 1 CAPSULE EVERY FRIDAY 12 capsule 2  . warfarin (COUMADIN) 10 MG tablet One tablet Mon, Wed, Fri    . warfarin (COUMADIN) 5 MG tablet Take 2.5 mg by mouth daily. 12.32m Tues,Thurs, Sat, Sun     No current facility-administered medications for this visit.     Allergies:  Allergies  Allergen Reactions  . Adhesive [Tape] Rash    Past Medical History, Surgical history, Social history, and Family History were reviewed and updated.   Physical Exam: Blood pressure 112/67, pulse 68, temperature 98.3 F (36.8 C), temperature source Oral, resp. rate 17, height 5' 7.5" (1.715 m), weight 230 lb 4.8 oz (104.463 kg), SpO2 100 %. ECOG: 1 General appearance: Well-appearing woman without distress. Head: Normocephalic, without obvious abnormality oral mucosa without thrush. Neck: no adenopathy Lymph nodes: Cervical, supraclavicular, and axillary nodes normal. Heart:regular rate and rhythm, S1, S2 normal, no murmur, click, rub or gallop Chest wall examination:  Scar tissue noted in the right axilla. No masses. Lung:chest clear, no wheezing, rales, normal symmetric air entry Abdomin: soft, non-tender, without masses or organomegaly no rebound or guarding. EXT:. Bilateral lower extremity edema noted.   Lab Results: Lab Results  Component Value Date   WBC 4.1 01/19/2016   HGB 12.1 01/19/2016   HCT 37.7 01/19/2016   MCV 89.0 01/19/2016   PLT 184 01/19/2016     Chemistry      Component Value  Date/Time   NA 137 10/14/2015 1028   NA 139 04/28/2015 0823   K 4.5 10/14/2015 1028   K 4.1 04/28/2015 0823   CL 101 10/14/2015 1028   CL 102 10/29/2012 1014   CO2 30 10/14/2015 1028   CO2 28 04/28/2015 0823   BUN 14 10/14/2015 1028   BUN 13.9 04/28/2015 0823   CREATININE 0.81 10/14/2015 1028   CREATININE 0.8 04/28/2015 0823   CREATININE 0.83 04/28/2014 1015      Component Value Date/Time   CALCIUM 9.7 10/14/2015 1028   CALCIUM 9.2 04/28/2015 0823   ALKPHOS 84 07/14/2015 0936   ALKPHOS 86 04/28/2015 0823   AST 28 07/14/2015 0936   AST 29 04/28/2015 0823   ALT 32 07/14/2015 0936   ALT 42 04/28/2015 0823   BILITOT 0.5 07/14/2015 0936   BILITOT 0.38 04/28/2015 0823     Impression and Plan: This is a 70year old female with the following issues: 1. Iron deficiency anemia:  She is on iron  replacement and hemoglobin and iron studies continue to be normal. She has no difficulties taken oral iron and we will continue to monitor iron levels given her history of gastric bypass operation. She is at risk of developing worsening iron deficiency. 2. History of breast cancer.  She has no evidence of any recurrence at this time. Physical examination today did not reveal any evidence of recurrence.  CT scan on April 2016  showed no evidence of relapse. 3. History of deep vein thrombosis, currently anticoagulated with Coumadin. No recent thrombosis noted. 4. History of GIST detected on endoscopy without any evidence of recurrence. There is no clinical signs or symptoms of relapse at this time. We will continue to monitor her clinically. CT scan on April 2016 did not show any evidence of relapse. 5. Abnormal liver function tests: This have normalized at this time. 6. MDS risk: Given the high dose chemotherapy she received she is at risk of developing myelodysplasia and possible leukemia. We will continue to screen you with complete blood counts periodically. 7. Followup will be  In 6 months sooner  if needed it to.    [facility], MD 6/21/20179:37 AM

## 2016-01-20 DIAGNOSIS — H524 Presbyopia: Secondary | ICD-10-CM | POA: Diagnosis not present

## 2016-01-20 DIAGNOSIS — H52223 Regular astigmatism, bilateral: Secondary | ICD-10-CM | POA: Diagnosis not present

## 2016-01-20 DIAGNOSIS — H25091 Other age-related incipient cataract, right eye: Secondary | ICD-10-CM | POA: Diagnosis not present

## 2016-01-20 DIAGNOSIS — Z961 Presence of intraocular lens: Secondary | ICD-10-CM | POA: Diagnosis not present

## 2016-01-20 DIAGNOSIS — H35343 Macular cyst, hole, or pseudohole, bilateral: Secondary | ICD-10-CM | POA: Diagnosis not present

## 2016-01-20 DIAGNOSIS — H18419 Arcus senilis, unspecified eye: Secondary | ICD-10-CM | POA: Diagnosis not present

## 2016-01-20 DIAGNOSIS — E11319 Type 2 diabetes mellitus with unspecified diabetic retinopathy without macular edema: Secondary | ICD-10-CM | POA: Diagnosis not present

## 2016-01-20 DIAGNOSIS — H5211 Myopia, right eye: Secondary | ICD-10-CM | POA: Diagnosis not present

## 2016-01-24 ENCOUNTER — Other Ambulatory Visit (INDEPENDENT_AMBULATORY_CARE_PROVIDER_SITE_OTHER): Payer: Medicare Other

## 2016-01-24 DIAGNOSIS — E1142 Type 2 diabetes mellitus with diabetic polyneuropathy: Secondary | ICD-10-CM

## 2016-01-24 LAB — COMPREHENSIVE METABOLIC PANEL
ALK PHOS: 72 U/L (ref 39–117)
ALT: 23 U/L (ref 0–35)
AST: 22 U/L (ref 0–37)
Albumin: 3.9 g/dL (ref 3.5–5.2)
BILIRUBIN TOTAL: 0.3 mg/dL (ref 0.2–1.2)
BUN: 15 mg/dL (ref 6–23)
CALCIUM: 9.2 mg/dL (ref 8.4–10.5)
CO2: 27 meq/L (ref 19–32)
Chloride: 104 mEq/L (ref 96–112)
Creatinine, Ser: 0.86 mg/dL (ref 0.40–1.20)
GFR: 84.36 mL/min (ref >60.00)
Glucose, Bld: 87 mg/dL (ref 70–99)
POTASSIUM: 3.9 meq/L (ref 3.5–5.1)
SODIUM: 138 meq/L (ref 135–145)
Total Protein: 6.7 g/dL (ref 6.0–8.3)

## 2016-01-24 LAB — LIPID PANEL
CHOL/HDL RATIO: 2
Cholesterol: 163 mg/dL (ref 0–200)
HDL: 83.6 mg/dL (ref >39.00)
LDL Cholesterol: 64 mg/dL (ref 0–99)
NonHDL: 79.31
TRIGLYCERIDES: 75 mg/dL (ref 0.0–149.0)
VLDL: 15 mg/dL (ref 0.0–40.0)

## 2016-01-26 ENCOUNTER — Ambulatory Visit: Payer: Medicare Other | Admitting: Endocrinology

## 2016-01-27 ENCOUNTER — Encounter: Payer: Self-pay | Admitting: Endocrinology

## 2016-01-27 ENCOUNTER — Ambulatory Visit (INDEPENDENT_AMBULATORY_CARE_PROVIDER_SITE_OTHER): Payer: Medicare Other | Admitting: Endocrinology

## 2016-01-27 VITALS — BP 122/80 | HR 73 | Ht 67.5 in | Wt 230.0 lb

## 2016-01-27 DIAGNOSIS — K911 Postgastric surgery syndromes: Secondary | ICD-10-CM

## 2016-01-27 DIAGNOSIS — R531 Weakness: Secondary | ICD-10-CM | POA: Diagnosis not present

## 2016-01-27 DIAGNOSIS — E1142 Type 2 diabetes mellitus with diabetic polyneuropathy: Secondary | ICD-10-CM

## 2016-01-27 DIAGNOSIS — N390 Urinary tract infection, site not specified: Secondary | ICD-10-CM

## 2016-01-27 DIAGNOSIS — E039 Hypothyroidism, unspecified: Secondary | ICD-10-CM

## 2016-01-27 DIAGNOSIS — R609 Edema, unspecified: Secondary | ICD-10-CM | POA: Diagnosis not present

## 2016-01-27 LAB — URINALYSIS, ROUTINE W REFLEX MICROSCOPIC
Bilirubin Urine: NEGATIVE
KETONES UR: NEGATIVE
Nitrite: NEGATIVE
SPECIFIC GRAVITY, URINE: 1.01 (ref 1.000–1.030)
TOTAL PROTEIN, URINE-UPE24: NEGATIVE
URINE GLUCOSE: NEGATIVE
UROBILINOGEN UA: 0.2 (ref 0.0–1.0)
pH: 7 (ref 5.0–8.0)

## 2016-01-27 MED ORDER — HYDROXYZINE HCL 25 MG PO TABS: 25.0000 mg | ORAL_TABLET | Freq: Three times a day (TID) | ORAL | Status: DC | PRN

## 2016-01-27 NOTE — Progress Notes (Signed)
Patient ID: Kelli Martinez, female   DOB: 01-Mar-1946, 70 y.o.   MRN: [id number]    Chief complaint: Followup of various issues   History of Present Illness:  The patient is seen for the following problems:  1. Postprandial hypoglycemia related to dumping syndrome   She has had postprandial hypoglycemia starting a couple of years after her gastric bypass surgery Her symptoms of blood sugars are mostly a feeling of significant weakness, some shakiness.  She will use fruits or juice in order to relieve the symptoms She has been treated with acarbose improvement. She did try verapamil empirically but this did not help.   She is taking her Precose usually right before eating She given 2 tablets at breakfast and 1 at dinner    Recent history: she has had infrequent episodes of low blood sugars, 1 recently  For breakfast she will have bacon or eggs in the morning usually with fruit and occasionally cereal   She usually has a protein at lunch and dinner  She has been to the dietitian and has been instructed on balanced low-fat meals with enough protein consistently and restricting carbohydrates including fruits.    Wt Readings from Last 3 Encounters:  01/27/16 230 lb (104.327 kg)  01/19/16 230 lb 4.8 oz (104.463 kg)  10/20/15 241 lb 3.2 oz (109.408 kg)   2.  History of diabetes:   Usually has a normal A1c readings around 6.2-6.6 Has not checked her blood sugars much recentlyAnd did not bring monitor She has lost weight since last visit Currently on Precose as above She is trying to be more active, doing more exercises   Meal portion size is usually well controlled    Lab Results  Component Value Date   HGBA1C 6.6 10/20/2015   HGBA1C 6.5 07/14/2015   HGBA1C 6.2 12/30/2014   Lab Results  Component Value Date   MICROALBUR <0.7 12/01/2014   LDLCALC 64 01/24/2016   CREATININE 0.86 01/24/2016      Other active problems: see review of systems   Today she is  asking about mild discomfort with urination, has been trying cranberry OTC      Medication List       This list is accurate as of: 01/27/16 11:59 PM.  Always use your most recent med list.               acarbose 25 MG tablet  Commonly known as:  PRECOSE  TAKE 1 TABLET THREE TIMES A DAY     albuterol 108 (90 Base) MCG/ACT inhaler  Commonly known as:  PROVENTIL HFA;VENTOLIN HFA  Inhale 2 puffs into the lungs 4 (four) times daily as needed. As needed rescue inhaler     atorvastatin 20 MG tablet  Commonly known as:  LIPITOR  TAKE 1 TABLET DAILY     baclofen 10 MG tablet  Commonly known as:  LIORESAL  Take 1 tablet 4 times daily     ferrous sulfate 325 (65 FE) MG tablet  Take 325 mg by mouth daily.     fluticasone 50 MCG/ACT nasal spray  Commonly known as:  FLONASE  USE 2 SPRAYS IN EACH NOSTRIL DAILY     Fluticasone-Salmeterol 100-50 MCG/DOSE Aepb  Commonly known as:  ADVAIR DISKUS  USE 1 INHALATION EVERY 12 HOURS, RINSE MOUTH     freestyle lancets  USE AS INSTRUCTED TO CHECK BLOOD SUGAR ONCE DAILY     FREESTYLE LITE Devi  Use to check blood sugar once  a day DX code E11.65     FREESTYLE FREEDOM LITE w/Device Kit  use as directed     FREESTYLE LITE test strip  Generic drug:  glucose blood  USE AS INSTRUCTED TO CHECK BLOOD SUGAR ONCE DAILY     furosemide 40 MG tablet  Commonly known as:  LASIX  TAKE 1 TABLET EVERY MORNING     gabapentin 300 MG capsule  Commonly known as:  NEURONTIN  TAKE 1 CAPSULE FOUR TIMES A DAY     HYDROcodone-acetaminophen 5-325 MG tablet  Commonly known as:  NORCO  Take 1 tablet by mouth 2 (two) times daily as needed for moderate pain.     hydrOXYzine 25 MG tablet  Commonly known as:  ATARAX/VISTARIL  Take 1 tablet (25 mg total) by mouth 3 (three) times daily as needed.     KLOR-CON M20 20 MEQ tablet  Generic drug:  potassium chloride SA  TAKE 1 TABLET DAILY     levothyroxine 125 MCG tablet  Commonly known as:  SYNTHROID,  LEVOTHROID  TAKE 1 TABLET DAILY BEFORE BREAKFAST     magnesium chloride 64 MG Tbec SR tablet  Commonly known as:  SLOW-MAG  Take 1 tablet (64 mg total) by mouth 2 (two) times daily.     methylcellulose 1 % ophthalmic solution  Commonly known as:  ARTIFICIAL TEARS  Place 1 drop into both eyes as needed. Dry eyes     NON FORMULARY  CPAP 6 [employer]     omeprazole 20 MG capsule  Commonly known as:  PRILOSEC  TAKE 1 CAPSULE DAILY     rOPINIRole 1 MG tablet  Commonly known as:  REQUIP  Take 1 mg by mouth at bedtime. Reported on 01/27/2016     spironolactone 50 MG tablet  Commonly known as:  ALDACTONE  Take 1 tablet (50 mg total) by mouth daily. Take 1 tablet two times daily     temazepam 15 MG capsule  Commonly known as:  RESTORIL  1-2 for sleep as needed     triamcinolone cream 0.1 %  Commonly known as:  KENALOG  Apply 1 application topically 2 (two) times daily.     vitamin B-12 500 MCG tablet  Commonly known as:  CYANOCOBALAMIN  Take 500 mcg by mouth daily.     Vitamin D (Ergocalciferol) 50000 units Caps capsule  Commonly known as:  DRISDOL  TAKE 1 CAPSULE EVERY FRIDAY     warfarin 5 MG tablet  Commonly known as:  COUMADIN  Take 2.5 mg by mouth daily. 12.44m Tues,Thurs, Sat, Sun     warfarin 10 MG tablet  Commonly known as:  COUMADIN  One tablet Mon, Wed, Fri        Allergies:  Allergies  Allergen Reactions  . Adhesive [Tape] Rash    Past Medical History  Diagnosis Date  . Allergy   . Asthma   . Pulmonary embolism ([facility] 1998 / 1994 /1968  . HX: breast cancer   . Blind right eye     hemorrhage  . Hypothyroid   . Clotting disorder ([facility]   . Hypertension   . Blood transfusion   . Morbid obesity ([facility]   . Syncope and collapse 2011    due to anemia  . GERD (gastroesophageal reflux disease)   . Hyperlipidemia   . Osteoporosis   . Psoriasis   . Gallstones   . Anemia   . Elevated liver enzymes   . Diabetes mellitus     hypoglycemia since  large wt  loss  . OSA (obstructive sleep apnea)     uses c pap  . Arthritis   . Cancer ([place])     breast 1994  . Lymphedema of arm     RT  . Back pain, chronic     GETS INJECTIONS IN BACK    Past Surgical History  Procedure Laterality Date  . Abdominal hysterectomy    . Mastectomy      Right  . Gastric bypass  2011    bariatric surgery  . Ivc filter      recurrent DVT  . Bone marrow transplant  1994  . Cardiovascular stress test  05/23/2005    EF 53%  . US echocardiography  10/27/08    EF 55-60%  . Knee arthrotomy  1998  . Carpal tunnell      bil  . Cataracts    . Hiatal hernia repair    . Cholecystectomy  05/10/2012    Procedure: LAPAROSCOPIC CHOLECYSTECTOMY WITH INTRAOPERATIVE CHOLANGIOGRAM;  Surgeon: Pedro Earls, MD;  Location: [facility];  Service: General;  Laterality: N/A;  Laparoscopic Cholecystectomy with Intraoperative Cholangiogram    Family History  Problem Relation Age of Onset  . Sudden death Mother     car accident  . Cancer Father 78    lung  . Cancer Sister 24    lung cancer  . Asthma Daughter     Social History:  reports that she quit smoking about 37 years ago. Her smoking use included Cigarettes. She has a 5 pack-year smoking history. She has never used smokeless tobacco. She reports that she does not drink alcohol or use illicit drugs.  Review of Systems   Periodic weakness.  Occasionally she will feel tired or week without lightheadedness or low blood sugar, this appears to be recurrent and chronic No recent anemia   EDEMA: She has had problems with  edema both in her legs and right arm With trying to elevate her legs and Aldactone she has less consistent edema Has been on 50 mg bid of Aldactone   She has taken Lasix only occasionally She has not been using the impression stockings as prescribed   Lymphedema right arm, secondary to  breast surgery, using  elastic compression stocking   History of mild hypothyroidism: Adequately replaced  with  62.5 mcg ,  has had normal TSH   Lab Results  Component Value Date   TSH 1.25 10/14/2015    Hypercholesterolemia: LDL has been at target with Lipitor and she is compliant with this.  Taking this also for cardiovascular prophylaxis Recent levels:  Lab Results  Component Value Date   CHOL 163 01/24/2016   HDL 83.60 01/24/2016   LDLCALC 64 01/24/2016   TRIG 75.0 01/24/2016   CHOLHDL 2 01/24/2016    Previous history of hypertension: Currently not on any medications except diuretics.  No lightheadedness  BP Readings from Last 3 Encounters:  01/27/16 122/80  01/19/16 112/67  10/20/15 128/84     She has had periodic epidural injections for sciatica and back pain, followed by orthopedic surgeon  Today she is asking about generalized itching which has occurred for the last couple of months.  She thinks she is trying to avoid abrasive soaps only some relief.  No rash   LABS:  Office Visit on 01/27/2016  Component Date Value Ref Range Status  . Color, Urine 01/27/2016 YELLOW  Yellow;Lt. Yellow Final  . APPearance 01/27/2016 SL CLOUDY* Clear Final  . Specific  Gravity, Urine 01/27/2016 1.010  1.000-1.030 Final  . pH 01/27/2016 7.0  5.0 - 8.0 Final  . Total Protein, Urine 01/27/2016 NEGATIVE  Negative Final  . Urine Glucose 01/27/2016 NEGATIVE  Negative Final  . Ketones, ur 01/27/2016 NEGATIVE  Negative Final  . Bilirubin Urine 01/27/2016 NEGATIVE  Negative Final  . Hgb urine dipstick 01/27/2016 SMALL* Negative Final  . Urobilinogen, UA 01/27/2016 0.2  0.0 - 1.0 Final  . Leukocytes, UA 01/27/2016 LARGE* Negative Final  . Nitrite 01/27/2016 NEGATIVE  Negative Final  . WBC, UA 01/27/2016 TNTC(>50/hpf)* 0-2/hpf Final  . RBC / HPF 01/27/2016 7-10/hpf* 0-2/hpf Final  . Squamous Epithelial / LPF 01/27/2016 Rare(0-4/hpf)  Rare(0-4/hpf) Final  . Bacteria, UA 01/27/2016 Many(>50/hpf)* None Final  Lab on 01/24/2016  Component Date Value Ref Range Status  . Sodium 01/24/2016 138  135  - 145 mEq/L Final  . Potassium 01/24/2016 3.9  3.5 - 5.1 mEq/L Final  . Chloride 01/24/2016 104  96 - 112 mEq/L Final  . CO2 01/24/2016 27  19 - 32 mEq/L Final  . Glucose, Bld 01/24/2016 87  70 - 99 mg/dL Final  . BUN 01/24/2016 15  6 - 23 mg/dL Final  . Creatinine, Ser 01/24/2016 0.86  0.40 - 1.20 mg/dL Final  . Total Bilirubin 01/24/2016 0.3  0.2 - 1.2 mg/dL Final  . Alkaline Phosphatase 01/24/2016 72  39 - 117 U/L Final  . AST 01/24/2016 22  0 - 37 U/L Final  . ALT 01/24/2016 23  0 - 35 U/L Final  . Total Protein 01/24/2016 6.7  6.0 - 8.3 g/dL Final  . Albumin 01/24/2016 3.9  3.5 - 5.2 g/dL Final  . Calcium 01/24/2016 9.2  8.4 - 10.5 mg/dL Final  . GFR 01/24/2016 84.36  >60.00 mL/min Final  . Cholesterol 01/24/2016 163  0 - 200 mg/dL Final   ATP III Classification       Desirable:  < 200 mg/dL               Borderline High:  200 - 239 mg/dL          High:  > = 240 mg/dL  . Triglycerides 01/24/2016 75.0  0.0 - 149.0 mg/dL Final   Normal:  <150 mg/dLBorderline High:  150 - 199 mg/dL  . HDL 01/24/2016 83.60  >39.00 mg/dL Final  . VLDL 01/24/2016 15.0  0.0 - 40.0 mg/dL Final  . LDL Cholesterol 01/24/2016 64  0 - 99 mg/dL Final  . Total CHOL/HDL Ratio 01/24/2016 2   Final                  Men          Women1/2 Average Risk     3.4          3.3Average Risk          5.0          4.42X Average Risk          9.6          7.13X Average Risk          15.0          11.0                      . NonHDL 01/24/2016 79.31   Final   NOTE:  Non-HDL goal should be 30 mg/dL higher than patient's LDL goal (i.e. LDL goal of < 70 mg/dL, would have non-HDL goal of <  100 mg/dL)  Appointment on 01/19/2016  Component Date Value Ref Range Status  . WBC 01/19/2016 4.1  3.9 - 10.3 10e3/uL Final  . NEUT# 01/19/2016 2.5  1.5 - 6.5 10e3/uL Final  . HGB 01/19/2016 12.1  11.6 - 15.9 g/dL Final  . HCT 01/19/2016 37.7  34.8 - 46.6 % Final  . Platelets 01/19/2016 184  145 - 400 10e3/uL Final  . MCV 01/19/2016 89.0  79.5  - 101.0 fL Final  . MCH 01/19/2016 28.6  25.1 - 34.0 pg Final  . MCHC 01/19/2016 32.1  31.5 - 36.0 g/dL Final  . RBC 01/19/2016 4.24  3.70 - 5.45 10e6/uL Final  . RDW 01/19/2016 14.0  11.2 - 14.5 % Final  . lymph# 01/19/2016 1.2  0.9 - 3.3 10e3/uL Final  . MONO# 01/19/2016 0.2  0.1 - 0.9 10e3/uL Final  . Eosinophils Absolute 01/19/2016 0.1  0.0 - 0.5 10e3/uL Final  . Basophils Absolute 01/19/2016 0.1  0.0 - 0.1 10e3/uL Final  . NEUT% 01/19/2016 61.2  38.4 - 76.8 % Final  . LYMPH% 01/19/2016 29.7  14.0 - 49.7 % Final  . MONO% 01/19/2016 5.8  0.0 - 14.0 % Final  . EOS% 01/19/2016 2.0  0.0 - 7.0 % Final  . BASO% 01/19/2016 1.3  0.0 - 2.0 % Final  . Sodium 01/19/2016 141  136 - 145 mEq/L Final  . Potassium 01/19/2016 4.2  3.5 - 5.1 mEq/L Final  . Chloride 01/19/2016 108  98 - 109 mEq/L Final  . CO2 01/19/2016 25  22 - 29 mEq/L Final  . Glucose 01/19/2016 104  70 - 140 mg/dl Final   Glucose reference range is for nonfasting patients. Fasting glucose reference range is 70- 100.  Marland Kitchen BUN 01/19/2016 15.4  7.0 - 26.0 mg/dL Final  . Creatinine 01/19/2016 0.9  0.6 - 1.1 mg/dL Final  . Total Bilirubin 01/19/2016 0.33  0.20 - 1.20 mg/dL Final  . Alkaline Phosphatase 01/19/2016 78  40 - 150 U/L Final  . AST 01/19/2016 22  5 - 34 U/L Final  . ALT 01/19/2016 25  0 - 55 U/L Final  . Total Protein 01/19/2016 7.0  6.4 - 8.3 g/dL Final  . Albumin 01/19/2016 3.5  3.5 - 5.0 g/dL Final  . Calcium 01/19/2016 9.5  8.4 - 10.4 mg/dL Final  . Anion Gap 01/19/2016 8  3 - 11 mEq/L Final  . EGFR 01/19/2016 75* >90 ml/min/1.73 m2 Final   eGFR is calculated using the CKD-EPI Creatinine Equation (2009)  . Ferritin 01/19/2016 854* 9 - 269 ng/ml Final  . Iron 01/19/2016 78  41 - 142 ug/dL Final  . TIBC 01/19/2016 324  236 - 444 ug/dL Final  . UIBC 01/19/2016 246  120 - 384 ug/dL Final  . %SAT 01/19/2016 24  21 - 57 % Final  Anti-coag visit on 01/07/2016  Component Date Value Ref Range Status  . INR 01/07/2016 3.4    Final     EXAM:  BP 122/80 mmHg  Pulse 73  Ht 5' 7.5" (1.715 m)  Wt 230 lb (104.327 kg)  BMI 35.47 kg/m2  SpO2 94%  No skin rash evident.  No borrows in her dorsal hands  Mild to moderate edema of legs, mostly on the left leg  Diabetic Foot Exam - Simple   Simple Foot Form  Diabetic Foot exam was performed with the following findings:  Yes   Visual Inspection  No deformities, no ulcerations, no other skin breakdown bilaterally:  Yes  Sensation Testing  See comments:  Yes  Pulse Check  See comments:  Yes  Comments  Significantly decreased monofilament sensation in the distal toes especially left.  Less decreased on the right toes Pedal pulses not palpable       Assessment/Plan:   1. Reactive/postprandial hypoglycemia from dumping syndrome.  She has periodic symptoms, Now less frequent and only occasionally before lunch when she does not have enough protein This was discussed  She will continue her Precose unchanged  Continue balanced meals with protein At each meal   2.  Generalized pruritus: No rash seen.  Not clear if etiology and Will empirically try her on Atarax May need to discuss with her dermatologist  3. Mild diabetes: A1c Is high normal and stable Is losing weight   4.  Liver functions  recently normal  5.  EDEMA of the legs: This is likely to be from venous insufficiency Will  keep her on 100 mg of Aldactone and continue leg elevation  She can Lasix occasionally   6.  Dysuria: Will check urinalysis to rule out UTI   7.  Episodes of weakness/malaise occasionally.  Not correlated with blood pressure or glucose.  Will keep checking her blood pressure periodically especially standing which has usually not been low.  Advised her to avoid excessive diuretics and continue magnesium supplements  Total visit time reviewing multiple problems, evaluation and management, review of labs and counseling = 25 minutes  Sheyna Pettibone 01/28/2016, 7:47 AM

## 2016-01-28 ENCOUNTER — Other Ambulatory Visit: Payer: Self-pay

## 2016-01-28 MED ORDER — SULFAMETHOXAZOLE-TRIMETHOPRIM 800-160 MG PO TABS: 1.0000 | ORAL_TABLET | Freq: Two times a day (BID) | ORAL | Status: DC

## 2016-02-04 ENCOUNTER — Ambulatory Visit (INDEPENDENT_AMBULATORY_CARE_PROVIDER_SITE_OTHER): Payer: Medicare Other | Admitting: General Practice

## 2016-02-04 DIAGNOSIS — Z5181 Encounter for therapeutic drug level monitoring: Secondary | ICD-10-CM

## 2016-02-04 LAB — POCT INR: INR: 4.4

## 2016-02-04 NOTE — Progress Notes (Signed)
Pre visit review using our clinic review tool, if applicable. No additional management support is needed unless otherwise documented below in the visit note. INR is high today.  Patient says that she has been taking coumadin as instructed and that her diet has not changed nor her medications.  Patient did say that she has lost about 20 pounds.  I did hold her dosage for 2 days and decreased dosage by 15 percent.  I will re-check her INR in 2 weeks.  I did reiterate the dangers of a supra-therapeutic INR and encouraged patient to use a pill box to manage her medications.  Patient verbalized understanding.  Also encouraged patient to report unusual bleeding or bruising.

## 2016-02-18 ENCOUNTER — Ambulatory Visit (INDEPENDENT_AMBULATORY_CARE_PROVIDER_SITE_OTHER): Payer: Medicare Other | Admitting: General Practice

## 2016-02-18 DIAGNOSIS — Z5181 Encounter for therapeutic drug level monitoring: Secondary | ICD-10-CM

## 2016-02-18 LAB — POCT INR: INR: 1.9

## 2016-02-18 NOTE — Progress Notes (Signed)
Pre visit review using our clinic review tool, if applicable. No additional management support is needed unless otherwise documented below in the visit note. 

## 2016-02-18 NOTE — Progress Notes (Signed)
I have reviewed and agree with the plan. 

## 2016-02-20 ENCOUNTER — Emergency Department (HOSPITAL_COMMUNITY)
Admission: EM | Admit: 2016-02-20 | Discharge: 2016-02-20 | Disposition: A | Discharge status: Home / Self Care | Payer: Medicare Other | Attending: Emergency Medicine | Admitting: Emergency Medicine

## 2016-02-20 ENCOUNTER — Emergency Department (HOSPITAL_COMMUNITY): Payer: Medicare Other

## 2016-02-20 ENCOUNTER — Encounter (HOSPITAL_COMMUNITY): Payer: Self-pay | Admitting: Emergency Medicine

## 2016-02-20 DIAGNOSIS — E119 Type 2 diabetes mellitus without complications: Secondary | ICD-10-CM | POA: Insufficient documentation

## 2016-02-20 DIAGNOSIS — Z79899 Other long term (current) drug therapy: Secondary | ICD-10-CM | POA: Diagnosis not present

## 2016-02-20 DIAGNOSIS — Z7984 Long term (current) use of oral hypoglycemic drugs: Secondary | ICD-10-CM | POA: Insufficient documentation

## 2016-02-20 DIAGNOSIS — Z87891 Personal history of nicotine dependence: Secondary | ICD-10-CM | POA: Diagnosis not present

## 2016-02-20 DIAGNOSIS — I10 Essential (primary) hypertension: Secondary | ICD-10-CM | POA: Insufficient documentation

## 2016-02-20 DIAGNOSIS — R404 Transient alteration of awareness: Secondary | ICD-10-CM | POA: Diagnosis not present

## 2016-02-20 DIAGNOSIS — Z7901 Long term (current) use of anticoagulants: Secondary | ICD-10-CM | POA: Diagnosis not present

## 2016-02-20 DIAGNOSIS — R51 Headache: Secondary | ICD-10-CM | POA: Insufficient documentation

## 2016-02-20 DIAGNOSIS — Z853 Personal history of malignant neoplasm of breast: Secondary | ICD-10-CM | POA: Diagnosis not present

## 2016-02-20 DIAGNOSIS — E860 Dehydration: Secondary | ICD-10-CM | POA: Diagnosis not present

## 2016-02-20 DIAGNOSIS — R55 Syncope and collapse: Secondary | ICD-10-CM | POA: Diagnosis not present

## 2016-02-20 DIAGNOSIS — J45909 Unspecified asthma, uncomplicated: Secondary | ICD-10-CM | POA: Insufficient documentation

## 2016-02-20 LAB — URINALYSIS, ROUTINE W REFLEX MICROSCOPIC
BILIRUBIN URINE: NEGATIVE
Glucose, UA: NEGATIVE mg/dL
HGB URINE DIPSTICK: NEGATIVE
Ketones, ur: NEGATIVE mg/dL
Nitrite: NEGATIVE
PH: 5.5 (ref 5.0–8.0)
Protein, ur: NEGATIVE mg/dL
SPECIFIC GRAVITY, URINE: 1.02 (ref 1.005–1.030)

## 2016-02-20 LAB — CBC
HEMATOCRIT: 38.8 % (ref 36.0–46.0)
HEMOGLOBIN: 12.3 g/dL (ref 12.0–15.0)
MCH: 28.1 pg (ref 26.0–34.0)
MCHC: 31.7 g/dL (ref 30.0–36.0)
MCV: 88.6 fL (ref 78.0–100.0)
Platelets: 193 10*3/uL (ref 150–400)
RBC: 4.38 MIL/uL (ref 3.87–5.11)
RDW: 13.5 % (ref 11.5–15.5)
WBC: 5.4 10*3/uL (ref 4.0–10.5)

## 2016-02-20 LAB — BASIC METABOLIC PANEL
ANION GAP: 8 (ref 5–15)
BUN: 17 mg/dL (ref 6–20)
CHLORIDE: 100 mmol/L — AB (ref 101–111)
CO2: 27 mmol/L (ref 22–32)
Calcium: 9.8 mg/dL (ref 8.9–10.3)
Creatinine, Ser: 1.15 mg/dL — ABNORMAL HIGH (ref 0.44–1.00)
GFR calc non Af Amer: 48 mL/min — ABNORMAL LOW (ref >60)
GFR, EST AFRICAN AMERICAN: 55 mL/min — AB (ref >60)
GLUCOSE: 108 mg/dL — AB (ref 65–99)
POTASSIUM: 4.4 mmol/L (ref 3.5–5.1)
Sodium: 135 mmol/L (ref 135–145)

## 2016-02-20 LAB — I-STAT TROPONIN, ED: Troponin i, poc: 0 ng/mL (ref 0.00–0.08)

## 2016-02-20 LAB — URINE MICROSCOPIC-ADD ON
Bacteria, UA: NONE SEEN
RBC / HPF: NONE SEEN RBC/hpf (ref 0–5)

## 2016-02-20 LAB — CBG MONITORING, ED: GLUCOSE-CAPILLARY: 177 mg/dL — AB (ref 65–99)

## 2016-02-20 MED ORDER — SODIUM CHLORIDE 0.9 % IV BOLUS (SEPSIS)
1000.0000 mL | Freq: Once | INTRAVENOUS | Status: AC
  Administered 2016-02-20: 1000 mL via INTRAVENOUS

## 2016-02-20 NOTE — ED Triage Notes (Addendum)
Pt arrives via EMS from outdoor class reunion where pt had near syncopal event. Pt on blood thinners. Pt c/o slight headache. No obvious injuries deformities. EKG done. CBG 218. Pt alert, oriented x4. Pt reports over the last few weeks has been SOB, diaphoretic and had episodes of dizziness with walking. Pt also with delayed responses to questions.

## 2016-02-20 NOTE — Discharge Instructions (Signed)
Continue to stay hydrated and monitor your blood sugars regularly. Follow up with your primary care provide for discussion of your diagnoses after today's visit. Please return to the ER for new or worsening symptoms, any additional concerns.

## 2016-02-20 NOTE — ED Notes (Signed)
Pt given warmed [place] sandwich and diet gingerale.

## 2016-02-20 NOTE — ED Provider Notes (Signed)
[place] DEPT Provider Note   CSN: [id number] Arrival date & time: 02/20/16  1503  First Provider Contact:  First MD Initiated Contact with Patient 02/20/16 1546      History   Chief Complaint Chief Complaint  Patient presents with  . Near Syncope    HPI RAYNESHA TIEDT is a 70 y.o. female.  The history is provided by the patient and medical records.     EMBERLYN BURLISON is a 70 y.o. female  with a PMH of HTN, HLD, DM, PE on coumadin who presents to the Emergency Department complaining of a near syncopal episode that began just prior to arrival. Patient states she was outside at a class reunion and thinks she may have overheated. She began to feel very weak and dizzy described as unsteady. She does not believe that she lost consciousness, but is unsure. She could hear people talking around her and putting ice on her head, but was "out of it". She believes the ice, along with drinking water, helped improve symptoms. At present, she feels much improved but still feels weak and complaining of a headache. Denies chest pain, shortness of breath, abdominal pain, back pain, fevers.    Past Medical History:  Diagnosis Date  . Allergy   . Anemia   . Arthritis   . Asthma   . Back pain, chronic    GETS INJECTIONS IN BACK  . Blind right eye    hemorrhage  . Blood transfusion   . Cancer ([place])    breast 1994  . Clotting disorder ([place])   . Diabetes mellitus    hypoglycemia since large wt loss  . Elevated liver enzymes   . Gallstones   . GERD (gastroesophageal reflux disease)   . HX: breast cancer   . Hyperlipidemia   . Hypertension   . Hypothyroid   . Lymphedema of arm    RT  . Morbid obesity ([place])   . OSA (obstructive sleep apnea)    uses c pap  . Osteoporosis   . Psoriasis   . Pulmonary embolism ([place]) 1998 / 1994 /1968  . Syncope and collapse 2011   due to anemia    Patient Active Problem List   Diagnosis Date Noted  . Encounter for therapeutic drug  monitoring 08/26/2013  . Insomnia 05/01/2013  . Long term (current) use of anticoagulants 04/15/2013  . Postsurgical dumping syndrome 04/06/2013  . Bilateral leg edema 04/06/2013  . Reactive hypoglycemia 04/02/2013  . Abdominal pain intermittent-evalulating for internal hernia 11/07/2012  . S/P laparoscopic cholecystectomy 05/31/2012  . Roux Y Gastric Bypass April 2011; repair [facility] 11/22/2011  . Unspecified deficiency anemia 11/14/2011  . Left leg paresthesias 10/27/2010  . Hypothyroid 10/27/2010  . Low back pain 10/27/2010  . Obstructive sleep apnea 09/29/2007  . PULMONARY EMBOLISM 09/25/2007  . Seasonal and perennial allergic rhinitis 09/25/2007  . Allergic-infective asthma 09/25/2007  . BREAST CANCER, HX OF 09/25/2007    Past Surgical History:  Procedure Laterality Date  . ABDOMINAL HYSTERECTOMY    . BONE MARROW TRANSPLANT  1994  . CARDIOVASCULAR STRESS TEST  05/23/2005   EF 53%  . carpal tunnell     bil  . cataracts    . CHOLECYSTECTOMY  05/10/2012   Procedure: LAPAROSCOPIC CHOLECYSTECTOMY WITH INTRAOPERATIVE CHOLANGIOGRAM;  Surgeon: Pedro Earls, MD;  Location: [facility] ORS;  Service: General;  Laterality: N/A;  Laparoscopic Cholecystectomy with Intraoperative Cholangiogram  . GASTRIC BYPASS  2011   bariatric surgery  . HIATAL HERNIA REPAIR    .  IVC filter     recurrent DVT  . KNEE ARTHROTOMY  1998  . MASTECTOMY     Right  . US ECHOCARDIOGRAPHY  10/27/08   EF 55-60%    OB History    No data available       Home Medications    Prior to Admission medications   Medication Sig Start Date End Date Taking? Authorizing Provider  acarbose (PRECOSE) 25 MG tablet TAKE 1 TABLET THREE TIMES A DAY 12/20/15   Elayne Snare, MD  albuterol (PROVENTIL HFA;VENTOLIN HFA) 108 (90 BASE) MCG/ACT inhaler Inhale 2 puffs into the lungs 4 (four) times daily as needed. As needed rescue inhaler 04/27/15 05/02/16  Deneise Lever, MD  atorvastatin (LIPITOR) 20 MG tablet TAKE 1 TABLET  DAILY Patient taking differently: patient takes 1/2 tablet every other day 12/06/15   Elayne Snare, MD  baclofen (LIORESAL) 10 MG tablet Take 1 tablet 4 times daily 12/31/15   Elayne Snare, MD  Blood Glucose Monitoring Suppl (FREESTYLE FREEDOM LITE) W/DEVICE KIT use as directed 04/22/15   Elayne Snare, MD  Blood Glucose Monitoring Suppl (FREESTYLE LITE) DEVI Use to check blood sugar once a day DX code E11.65 04/12/15   Elayne Snare, MD  ferrous sulfate 325 (65 FE) MG tablet Take 325 mg by mouth daily.      Historical Provider, MD  fluticasone (FLONASE) 50 MCG/ACT nasal spray USE 2 SPRAYS IN EACH NOSTRIL DAILY 12/31/15   Deneise Lever, MD  Fluticasone-Salmeterol (ADVAIR DISKUS) 100-50 MCG/DOSE AEPB USE 1 INHALATION EVERY 12 HOURS, RINSE MOUTH 04/27/15   Deneise Lever, MD  FREESTYLE LITE test strip USE AS INSTRUCTED TO CHECK BLOOD SUGAR ONCE DAILY 10/01/15   Elayne Snare, MD  furosemide (LASIX) 40 MG tablet TAKE 1 TABLET EVERY MORNING 12/28/15   Elayne Snare, MD  gabapentin (NEURONTIN) 300 MG capsule TAKE 1 CAPSULE FOUR TIMES A DAY 12/06/15   Elayne Snare, MD  HYDROcodone-acetaminophen (NORCO) 5-325 MG tablet Take 1 tablet by mouth 2 (two) times daily as needed for moderate pain. 10/20/15   Elayne Snare, MD  hydrOXYzine (ATARAX/VISTARIL) 25 MG tablet Take 1 tablet (25 mg total) by mouth 3 (three) times daily as needed. 01/27/16   Elayne Snare, MD  KLOR-CON M20 20 MEQ tablet TAKE 1 TABLET DAILY Patient not taking: Reported on 01/27/2016 01/18/16   Elayne Snare, MD  Lancets (FREESTYLE) lancets USE AS INSTRUCTED TO CHECK BLOOD SUGAR ONCE DAILY 10/01/15   Elayne Snare, MD  levothyroxine (SYNTHROID, LEVOTHROID) 125 MCG tablet TAKE 1 TABLET DAILY BEFORE BREAKFAST 11/18/15   Elayne Snare, MD  magnesium chloride (SLOW-MAG) 64 MG TBEC SR tablet Take 1 tablet (64 mg total) by mouth 2 (two) times daily. 07/21/13   Elayne Snare, MD  methylcellulose (ARTIFICIAL TEARS) 1 % ophthalmic solution Place 1 drop into both eyes as needed. Dry eyes    Historical  Provider, MD  NON FORMULARY CPAP 6 [facility]    Historical Provider, MD  omeprazole (PRILOSEC) 20 MG capsule TAKE 1 CAPSULE DAILY 01/18/16   Elayne Snare, MD  rOPINIRole (REQUIP) 1 MG tablet Take 1 mg by mouth at bedtime. Reported on 01/27/2016 01/01/15   Historical Provider, MD  spironolactone (ALDACTONE) 50 MG tablet Take 1 tablet (50 mg total) by mouth daily. Take 1 tablet two times daily 10/20/15   Elayne Snare, MD  sulfamethoxazole-trimethoprim (BACTRIM DS,SEPTRA DS) 800-160 MG tablet Take 1 tablet by mouth 2 (two) times daily. 01/28/16   Elayne Snare, MD  temazepam (RESTORIL) 15 MG  capsule 1-2 for sleep as needed Patient not taking: Reported on 01/27/2016 04/21/13   Deneise Lever, MD  triamcinolone cream (KENALOG) 0.1 % Apply 1 application topically 2 (two) times daily. 01/28/14   Elayne Snare, MD  vitamin B-12 (CYANOCOBALAMIN) 500 MCG tablet Take 500 mcg by mouth daily.      Historical Provider, MD  Vitamin D, Ergocalciferol, (DRISDOL) 50000 UNITS CAPS capsule TAKE 1 CAPSULE EVERY FRIDAY 06/14/15   Elayne Snare, MD  warfarin (COUMADIN) 10 MG tablet One tablet Mon, Wed, Fri 10/28/13   Elayne Snare, MD  warfarin (COUMADIN) 5 MG tablet Take 2.5 mg by mouth daily. 12.60m Tues,Thurs, Sat, Sun    Historical Provider, MD    Family History Family History  Problem Relation Age of Onset  . Sudden death Mother     car accident  . Cancer Father 793   lung  . Cancer Sister 533   lung cancer  . Asthma Daughter     Social History Social History  Substance Use Topics  . Smoking status: Former Smoker    Packs/day: 0.50    Years: 10.00    Types: Cigarettes    Quit date: 07/31/1978  . Smokeless tobacco: Never Used  . Alcohol use No     Allergies   Adhesive [tape]   Review of Systems Review of Systems  Constitutional: Positive for fatigue. Negative for chills and fever.  HENT: Negative for congestion and trouble swallowing.   Eyes: Negative for photophobia.  Respiratory: Negative for cough and shortness  of breath.   Cardiovascular: Negative.   Gastrointestinal: Negative for abdominal pain, nausea and vomiting.  Genitourinary: Negative for dysuria.  Musculoskeletal: Negative for back pain and neck pain.  Skin: Negative for color change and wound.  Neurological: Positive for dizziness, light-headedness and headaches. Negative for speech difficulty.     Physical Exam Updated Vital Signs BP 102/72   Pulse 64   Temp 98.5 F (36.9 C) (Oral)   Resp 17   SpO2 100%   Physical Exam  Constitutional: She is oriented to person, place, and time. She appears well-developed and well-nourished.  NAD  HENT:  Head: Normocephalic and atraumatic.  No evidence of tongue biting. Tacky mucus membranes.   Neck: Normal range of motion. Neck supple. No JVD present.  Cardiovascular: Normal rate, regular rhythm and normal heart sounds.   ntact and equal pulses in all four extremities  Pulmonary/Chest: Effort normal and breath sounds normal. No respiratory distress. She has no wheezes. She has no rales.  Abdominal: Soft. She exhibits no distension. There is no tenderness.  Musculoskeletal: She exhibits edema (Chronic per patient, chart confirms. ).  Neurological: She is alert and oriented to person, place, and time.  Alert, oriented, thought content appropriate, able to give a coherent history. Speech is clear and goal oriented, able to follow commands.  Cranial Nerves:  II:  Pupils equal, round, reactive to light III, IV, VI: EOM intact bilaterally, ptosis not present V,VII: smile symmetric, eyes kept closed tightly against resistance, facial light touch sensation equal VIII: hearing grossly normal IX, X: symmetric soft palate movement, uvula elevates symmetrically  XI: bilateral shoulder shrug symmetric and strong XII: midline tongue extension 5/5 muscle strength in upper and lower extremities bilaterally including strong and equal grip strength and dorsiflexion/plantar flexion Sensory to light touch  normal in all four extremities.  Normal finger-to-nose and rapid alternating movements.  Skin: No pallor.  Cap refill < 3 seconds  Psychiatric: She has a  normal mood and affect.  Nursing note and vitals reviewed.    ED Treatments / Results  Labs (all labs ordered are listed, but only abnormal results are displayed) Labs Reviewed  BASIC METABOLIC PANEL - Abnormal; Notable for the following:       Result Value   Chloride 100 (*)    Glucose, Bld 108 (*)    Creatinine, Ser 1.15 (*)    GFR calc non Af Amer 48 (*)    GFR calc Af Amer 55 (*)    All other components within normal limits  URINALYSIS, ROUTINE W REFLEX MICROSCOPIC (NOT AT [facility]) - Abnormal; Notable for the following:    Leukocytes, UA SMALL (*)    All other components within normal limits  URINE MICROSCOPIC-ADD ON - Abnormal; Notable for the following:    Squamous Epithelial / LPF 0-5 (*)    All other components within normal limits  CBG MONITORING, ED - Abnormal; Notable for the following:    Glucose-Capillary 177 (*)    All other components within normal limits  CBC  I-STAT TROPOININ, ED    EKG  EKG Interpretation  Date/Time:  Sunday February 20 2016 15:33:52 EDT Ventricular Rate:  66 PR Interval:    QRS Duration: 98 QT Interval:  406 QTC Calculation: 426 R Axis:   55 Text Interpretation:  Sinus rhythm Prolonged PR interval No significant change since last tracing Confirmed by NGUYEN, EMILY ([id number]) on 02/20/2016 3:55:15 PM       Radiology Ct Head Wo Contrast  Result Date: 02/20/2016 CLINICAL DATA:  Patient with posterior left-sided headache and dizziness. Syncopal episode. Fall. EXAM: CT HEAD WITHOUT CONTRAST TECHNIQUE: Contiguous axial images were obtained from the base of the skull through the vertex without intravenous contrast. COMPARISON:  Brain CT 08/03/2006. FINDINGS: Periventricular and subcortical white matter hypodensity compatible with chronic microvascular ischemic changes. No evidence for acute  cortically based infarct, intracranial hemorrhage, mass lesion or mass-effect. Orbits are unremarkable. Paranasal sinuses are well aerated. Mastoid air cells are unremarkable. Calvarium is intact. IMPRESSION: No acute intracranial process. Chronic microvascular ischemic changes. Electronically Signed   By: Lovey Newcomer M.D.   On: 02/20/2016 16:43   Procedures Procedures (including critical care time)  Medications Ordered in ED Medications  sodium chloride 0.9 % bolus 1,000 mL (0 mLs Intravenous Stopped 02/20/16 1800)     Initial Impression / Assessment and Plan / ED Course  I have reviewed the triage vital signs and the nursing notes.  Pertinent labs & imaging results that were available during my care of the patient were reviewed by me and considered in my medical decision making (see chart for details).  Clinical Course  Value Comment By Time  ED EKG (Reviewed) Ozella Almond Grettel Rames, PA-C 07/23 1546  ED EKG (Reviewed) Ozella Almond Laurabelle Gorczyca, PA-C 07/23 [place] presents to ED for near syncopal episode while outside at class reunion just prior to arrival. On exam, afebrile and hemodynamically stable. No focal neuro deficits. CT head, labs, fluids then reassess. Likely heat related.   EKG reviewed with attending. No significant change from prior.   CT head with no acute changes. Labs reviewed and reassuring. Bump in Cr. Of 1.15  Patient re-evaluated after fluids. Feels very much improved and looks improved as well - much more active and alert in the room. Ambulated in ED without difficulty. Feels ready for discharge to home. PCP follow up strongly recommended. Return precautions discussed and all questions answered.   Patient  seen by and discussed with Dr. Alfonse Spruce who agrees with treatment plan.   Final Clinical Impressions(s) / ED Diagnoses   Final diagnoses:  Near syncope  Dehydration    New Prescriptions Discharge Medication List as of 02/20/2016  6:33 PM         [facility] Aliegha Paullin, PA-C 02/20/16 Bertrand Nguyen, MD 02/21/16 [phone]

## 2016-02-23 ENCOUNTER — Other Ambulatory Visit: Payer: Self-pay | Admitting: Endocrinology

## 2016-02-26 ENCOUNTER — Other Ambulatory Visit: Payer: Self-pay | Admitting: Internal Medicine

## 2016-02-29 DIAGNOSIS — B351 Tinea unguium: Secondary | ICD-10-CM | POA: Diagnosis not present

## 2016-02-29 DIAGNOSIS — L97521 Non-pressure chronic ulcer of other part of left foot limited to breakdown of skin: Secondary | ICD-10-CM | POA: Diagnosis not present

## 2016-02-29 DIAGNOSIS — M722 Plantar fascial fibromatosis: Secondary | ICD-10-CM | POA: Diagnosis not present

## 2016-03-05 ENCOUNTER — Other Ambulatory Visit: Payer: Self-pay | Admitting: Endocrinology

## 2016-03-17 ENCOUNTER — Ambulatory Visit (INDEPENDENT_AMBULATORY_CARE_PROVIDER_SITE_OTHER): Payer: Medicare Other | Admitting: General Practice

## 2016-03-17 DIAGNOSIS — Z5181 Encounter for therapeutic drug level monitoring: Secondary | ICD-10-CM | POA: Diagnosis not present

## 2016-03-17 LAB — POCT INR: INR: 2.4

## 2016-03-17 NOTE — Progress Notes (Signed)
I have reviewed and agree with the plan. 

## 2016-04-04 DIAGNOSIS — L303 Infective dermatitis: Secondary | ICD-10-CM | POA: Diagnosis not present

## 2016-04-04 DIAGNOSIS — B351 Tinea unguium: Secondary | ICD-10-CM | POA: Diagnosis not present

## 2016-04-04 DIAGNOSIS — D2372 Other benign neoplasm of skin of left lower limb, including hip: Secondary | ICD-10-CM | POA: Diagnosis not present

## 2016-04-04 DIAGNOSIS — D2371 Other benign neoplasm of skin of right lower limb, including hip: Secondary | ICD-10-CM | POA: Diagnosis not present

## 2016-04-09 ENCOUNTER — Other Ambulatory Visit: Payer: Self-pay | Admitting: Endocrinology

## 2016-04-14 ENCOUNTER — Ambulatory Visit: Payer: Medicare Other

## 2016-04-17 ENCOUNTER — Other Ambulatory Visit: Payer: Self-pay | Admitting: Endocrinology

## 2016-04-20 ENCOUNTER — Other Ambulatory Visit: Payer: Self-pay | Admitting: Endocrinology

## 2016-04-21 ENCOUNTER — Ambulatory Visit (INDEPENDENT_AMBULATORY_CARE_PROVIDER_SITE_OTHER): Payer: Medicare Other | Admitting: General Practice

## 2016-04-21 DIAGNOSIS — Z23 Encounter for immunization: Secondary | ICD-10-CM | POA: Diagnosis not present

## 2016-04-21 DIAGNOSIS — Z5181 Encounter for therapeutic drug level monitoring: Secondary | ICD-10-CM

## 2016-04-21 LAB — POCT INR: INR: 2

## 2016-04-21 NOTE — Progress Notes (Signed)
I have reviewed and agree with the plan. 

## 2016-04-24 ENCOUNTER — Other Ambulatory Visit (INDEPENDENT_AMBULATORY_CARE_PROVIDER_SITE_OTHER): Payer: Medicare Other

## 2016-04-24 DIAGNOSIS — R531 Weakness: Secondary | ICD-10-CM

## 2016-04-24 DIAGNOSIS — E039 Hypothyroidism, unspecified: Secondary | ICD-10-CM

## 2016-04-24 DIAGNOSIS — E1142 Type 2 diabetes mellitus with diabetic polyneuropathy: Secondary | ICD-10-CM

## 2016-04-24 LAB — TSH: TSH: 2.35 u[IU]/mL (ref 0.35–4.50)

## 2016-04-24 LAB — COMPREHENSIVE METABOLIC PANEL
ALBUMIN: 3.9 g/dL (ref 3.5–5.2)
ALT: 21 U/L (ref 0–35)
AST: 22 U/L (ref 0–37)
Alkaline Phosphatase: 86 U/L (ref 39–117)
BUN: 15 mg/dL (ref 6–23)
CALCIUM: 9.2 mg/dL (ref 8.4–10.5)
CHLORIDE: 101 meq/L (ref 96–112)
CO2: 29 mEq/L (ref 19–32)
CREATININE: 0.99 mg/dL (ref 0.40–1.20)
GFR: 71.66 mL/min (ref >60.00)
Glucose, Bld: 104 mg/dL — ABNORMAL HIGH (ref 70–99)
POTASSIUM: 4.1 meq/L (ref 3.5–5.1)
Sodium: 137 mEq/L (ref 135–145)
Total Bilirubin: 0.3 mg/dL (ref 0.2–1.2)
Total Protein: 7.3 g/dL (ref 6.0–8.3)

## 2016-04-24 LAB — T4, FREE: Free T4: 0.92 ng/dL (ref 0.60–1.60)

## 2016-04-24 LAB — HEMOGLOBIN A1C: HEMOGLOBIN A1C: 6.5 % (ref 4.6–6.5)

## 2016-04-25 ENCOUNTER — Telehealth: Payer: Self-pay | Admitting: Internal Medicine

## 2016-04-25 MED ORDER — FLUTICASONE PROPIONATE 50 MCG/ACT NA SUSP: 2.0000 | Freq: Every day | NASAL | 3 refills | Status: DC

## 2016-04-25 MED ORDER — FLUTICASONE-SALMETEROL 100-50 MCG/DOSE IN AEPB: INHALATION_SPRAY | RESPIRATORY_TRACT | 3 refills | Status: DC

## 2016-04-25 MED ORDER — ALBUTEROL SULFATE HFA 108 (90 BASE) MCG/ACT IN AERS: INHALATION_SPRAY | RESPIRATORY_TRACT | 3 refills | Status: DC

## 2016-04-25 NOTE — Telephone Encounter (Signed)
Spoke with pt and she is requesting refills on Advair, ProAir and Flonase. Rx's sent. Nothing further needed.

## 2016-04-26 ENCOUNTER — Ambulatory Visit: Payer: Medicare Other | Admitting: Internal Medicine

## 2016-04-28 ENCOUNTER — Ambulatory Visit (INDEPENDENT_AMBULATORY_CARE_PROVIDER_SITE_OTHER): Payer: Medicare Other | Admitting: Endocrinology

## 2016-04-28 ENCOUNTER — Ambulatory Visit: Payer: Medicare Other | Admitting: Endocrinology

## 2016-04-28 VITALS — BP 130/70 | HR 66 | Temp 97.9°F | Resp 16 | Ht 67.5 in | Wt 223.2 lb

## 2016-04-28 DIAGNOSIS — E782 Mixed hyperlipidemia: Secondary | ICD-10-CM | POA: Diagnosis not present

## 2016-04-28 DIAGNOSIS — K911 Postgastric surgery syndromes: Secondary | ICD-10-CM | POA: Diagnosis not present

## 2016-04-28 DIAGNOSIS — E1142 Type 2 diabetes mellitus with diabetic polyneuropathy: Secondary | ICD-10-CM

## 2016-04-28 MED ORDER — MUPIROCIN CALCIUM 2 % EX CREA: 1.0000 "application " | TOPICAL_CREAM | Freq: Two times a day (BID) | CUTANEOUS | 0 refills | Status: DC

## 2016-04-28 MED ORDER — HYDROCODONE-ACETAMINOPHEN 5-325 MG PO TABS: 1.0000 | ORAL_TABLET | Freq: Two times a day (BID) | ORAL | 0 refills | Status: DC | PRN

## 2016-04-28 NOTE — Progress Notes (Signed)
Patient ID: Kelli Martinez, female   DOB: [birth year], 70 y.o.   MRN: [id number]    Chief complaint: Followup of various issues   History of Present Illness:  The patient is seen for the following problems:  1. Postprandial hypoglycemia related to dumping syndrome   She has had postprandial hypoglycemia starting a couple of years after her gastric bypass surgery Her symptoms of blood sugars are mostly a feeling of significant weakness, some shakiness.  She will use fruits or juice in order to relieve the symptoms  She has been treated with acarbose with improvement. She did try verapamil empirically but this did not help.    Recent history:  She is taking her Precose usually right before eating, 2 tablets at breakfast and 1 at dinner  For breakfast she will have bacon or eggs in the morning usually with fruit and occasionally cereal   She usually has a protein at lunch and dinner  She has been to the dietitian and has been instructed on balanced low-fat meals with enough protein consistently and restricting carbohydrates including fruits.    Recently she has had infrequent episodes of low blood sugars  She thinks she will have occasional low blood sugars after breakfast but occasionally after lunch and last night also felt weak late at night.  Last night her symptoms were relieved by eating a snack.  Wt Readings from Last 3 Encounters:  04/28/16 223 lb 3.2 oz (101.2 kg)  01/27/16 230 lb (104.3 kg)  01/19/16 230 lb 4.8 oz (104.5 kg)   2.  History of diabetes:   Usually has a normal A1c readings around 6.2-6.6 Has not checked her blood sugars much recently   FASTING blood sugars recently range from 104-124 and nonfasting readings 63-112 Currently on Precose as above  Recently  able to lose weight even though she is not able to exercise much because of her sciatica symptoms  Meal portion size is usually well controlled    Lab Results  Component Value Date     HGBA1C 6.5 04/24/2016   HGBA1C 6.6 10/20/2015   HGBA1C 6.5 07/14/2015   Lab Results  Component Value Date   MICROALBUR <0.7 12/01/2014   LDLCALC 64 01/24/2016   CREATININE 0.99 04/24/2016      Other active problems: see review of systems        Medication List       Accurate as of 04/28/16 10:26 AM. Always use your most recent med list.          acarbose 25 MG tablet Commonly known as:  PRECOSE TAKE 1 TABLET THREE TIMES A DAY   albuterol 108 (90 Base) MCG/ACT inhaler Commonly known as:  PROAIR HFA USE 2 INHALATIONS FOUR TIMES A DAY AS NEEDED ( AS NEEDED RESCUE INHALER)   atorvastatin 20 MG tablet Commonly known as:  LIPITOR TAKE 1 TABLET DAILY   baclofen 10 MG tablet Commonly known as:  LIORESAL Take 1 tablet 4 times daily   clobetasol cream 0.05 % Commonly known as:  TEMOVATE Apply 1 application topically 2 (two) times daily.   ferrous sulfate 325 (65 FE) MG tablet Take 325 mg by mouth daily.   fluticasone 50 MCG/ACT nasal spray Commonly known as:  FLONASE Place 2 sprays into both nostrils daily.   Fluticasone-Salmeterol 100-50 MCG/DOSE Aepb Commonly known as:  ADVAIR DISKUS USE 1 INHALATION EVERY 12 HOURS, RINSE MOUTH   freestyle lancets USE AS INSTRUCTED TO CHECK BLOOD SUGAR ONCE DAILY  FREESTYLE LITE Devi Use to check blood sugar once a day DX code E11.65   FREESTYLE FREEDOM LITE w/Device Kit use as directed   FREESTYLE LITE test strip Generic drug:  glucose blood USE AS INSTRUCTED TO CHECK BLOOD SUGAR ONCE DAILY   furosemide 40 MG tablet Commonly known as:  LASIX TAKE 1 TABLET EVERY MORNING   gabapentin 300 MG capsule Commonly known as:  NEURONTIN TAKE 1 CAPSULE FOUR TIMES A DAY   HYDROcodone-acetaminophen 5-325 MG tablet Commonly known as:  NORCO Take 1 tablet by mouth 2 (two) times daily as needed for moderate pain.   hydrOXYzine 25 MG tablet Commonly known as:  ATARAX/VISTARIL Take 1 tablet (25 mg total) by mouth 3 (three)  times daily as needed.   KLOR-CON M20 20 MEQ tablet Generic drug:  potassium chloride SA TAKE 1 TABLET DAILY   levothyroxine 125 MCG tablet Commonly known as:  SYNTHROID, LEVOTHROID TAKE 1 TABLET DAILY BEFORE BREAKFAST   magnesium chloride 64 MG Tbec SR tablet Commonly known as:  SLOW-MAG Take 1 tablet (64 mg total) by mouth 2 (two) times daily.   methylcellulose 1 % ophthalmic solution Commonly known as:  ARTIFICIAL TEARS Place 1 drop into both eyes as needed. Dry eyes   NON FORMULARY CPAP 6 [employer]   omeprazole 20 MG capsule Commonly known as:  PRILOSEC TAKE 1 CAPSULE DAILY   rOPINIRole 1 MG tablet Commonly known as:  REQUIP Take 1 mg by mouth at bedtime. Reported on 01/27/2016   spironolactone 50 MG tablet Commonly known as:  ALDACTONE TAKE 1 TABLET TWICE A DAY   sulfamethoxazole-trimethoprim 800-160 MG tablet Commonly known as:  BACTRIM DS,SEPTRA DS Take 1 tablet by mouth 2 (two) times daily.   temazepam 15 MG capsule Commonly known as:  RESTORIL 1-2 for sleep as needed   terbinafine 250 MG tablet Commonly known as:  LAMISIL Take 250 mg by mouth daily.   triamcinolone cream 0.1 % Commonly known as:  KENALOG Apply 1 application topically 2 (two) times daily.   vitamin B-12 500 MCG tablet Commonly known as:  CYANOCOBALAMIN Take 500 mcg by mouth daily.   Vitamin D (Ergocalciferol) 50000 units Caps capsule Commonly known as:  DRISDOL TAKE 1 CAPSULE EVERY FRIDAY   warfarin 10 MG tablet Commonly known as:  COUMADIN One tablet Mon, Wed, Fri   warfarin 5 MG tablet Commonly known as:  COUMADIN TAKE 2 TABLETS ONCE DAILY (THIS IS TO REPLACE THE 10 MG TABLET)       Allergies:  Allergies  Allergen Reactions  . Adhesive [Tape] Rash    Past Medical History:  Diagnosis Date  . Allergy   . Anemia   . Arthritis   . Asthma   . Back pain, chronic    GETS INJECTIONS IN BACK  . Blind right eye    hemorrhage  . Blood transfusion   . Cancer ([place])     breast 1994  . Clotting disorder ([place])   . Diabetes mellitus    hypoglycemia since large wt loss  . Elevated liver enzymes   . Gallstones   . GERD (gastroesophageal reflux disease)   . HX: breast cancer   . Hyperlipidemia   . Hypertension   . Hypothyroid   . Lymphedema of arm    RT  . Morbid obesity ([place])   . OSA (obstructive sleep apnea)    uses c pap  . Osteoporosis   . Psoriasis   . Pulmonary embolism ([place]) 1998 / 1994 /1968  .  Syncope and collapse 2011   due to anemia    Past Surgical History:  Procedure Laterality Date  . ABDOMINAL HYSTERECTOMY    . BONE MARROW TRANSPLANT  1994  . CARDIOVASCULAR STRESS TEST  05/23/2005   EF 53%  . carpal tunnell     bil  . cataracts    . CHOLECYSTECTOMY  05/10/2012   Procedure: LAPAROSCOPIC CHOLECYSTECTOMY WITH INTRAOPERATIVE CHOLANGIOGRAM;  Surgeon: Pedro Earls, MD;  Location: [facility];  Service: General;  Laterality: N/A;  Laparoscopic Cholecystectomy with Intraoperative Cholangiogram  . GASTRIC BYPASS  2011   bariatric surgery  . HIATAL HERNIA REPAIR    . IVC filter     recurrent DVT  . KNEE ARTHROTOMY  1998  . MASTECTOMY     Right  . US ECHOCARDIOGRAPHY  10/27/08   EF 55-60%    Family History  Problem Relation Age of Onset  . Sudden death Mother     car accident  . Cancer Father 15    lung  . Cancer Sister 30    lung cancer  . Asthma Daughter     Social History:  reports that she quit smoking about 37 years ago. Her smoking use included Cigarettes. She has a 5.00 pack-year smoking history. She has never used smokeless tobacco. She reports that she does not drink alcohol or use drugs.  Review of Systems     EDEMA: She has had problems with  edema both in her legs and right arm With trying to elevate her legs and Aldactone she has less edema, This is variable Has been on 50 mg bid of Aldactone   She has taken Lasix only occasionally She has not been using the compression stockings as prescribed    Lymphedema right arm, secondary to  breast surgery, using  elastic compression stocking   History of mild hypothyroidism: Adequately replaced  with 62.5 mcg ,  has  normal TSH   Lab Results  Component Value Date   TSH 2.35 04/24/2016    Hypercholesterolemia: LDL has been at target with Lipitor and she is compliant with this.  Taking this also for cardiovascular prophylaxis Recent levels:  Lab Results  Component Value Date   CHOL 163 01/24/2016   HDL 83.60 01/24/2016   LDLCALC 64 01/24/2016   TRIG 75.0 01/24/2016   CHOLHDL 2 01/24/2016    Previous history of hypertension: Currently not on any medications except diuretics.    BP Readings from Last 3 Encounters:  04/28/16 130/70  02/20/16 102/72  01/27/16 122/80     She has had More sciatica and back pain, followed by orthopedic surgeon  She was seen by a dermatologist for itching and what she calls breaking out and is on a steroid cream. Still having periodic symptoms and concern about macular rashes on various places  Also she is having some hard tender pimple-like lesions under her breast or right axilla   LABS:  Lab on 04/24/2016  Component Date Value Ref Range Status  . Hgb A1c MFr Bld 04/24/2016 6.5  4.6 - 6.5 % Final  . TSH 04/24/2016 2.35  0.35 - 4.50 uIU/mL Final  . Free T4 04/24/2016 0.92  0.60 - 1.60 ng/dL Final  . Sodium 04/24/2016 137  135 - 145 mEq/L Final  . Potassium 04/24/2016 4.1  3.5 - 5.1 mEq/L Final  . Chloride 04/24/2016 101  96 - 112 mEq/L Final  . CO2 04/24/2016 29  19 - 32 mEq/L Final  . Glucose, Bld 04/24/2016 104*  70 - 99 mg/dL Final  . BUN 04/24/2016 15  6 - 23 mg/dL Final  . Creatinine, Ser 04/24/2016 0.99  0.40 - 1.20 mg/dL Final  . Total Bilirubin 04/24/2016 0.3  0.2 - 1.2 mg/dL Final  . Alkaline Phosphatase 04/24/2016 86  39 - 117 U/L Final  . AST 04/24/2016 22  0 - 37 U/L Final  . ALT 04/24/2016 21  0 - 35 U/L Final  . Total Protein 04/24/2016 7.3  6.0 - 8.3 g/dL Final  .  Albumin 04/24/2016 3.9  3.5 - 5.2 g/dL Final  . Calcium 04/24/2016 9.2  8.4 - 10.5 mg/dL Final  . GFR 04/24/2016 71.66  >60.00 mL/min Final  Anti-coag visit on 04/21/2016  Component Date Value Ref Range Status  . INR 04/21/2016 2.0   Final     EXAM:  BP 130/70   Pulse 66   Temp 97.9 F (36.6 C)   Resp 16   Ht 5' 7.5" (1.715 m)   Wt 223 lb 3.2 oz (101.2 kg)   SpO2 94%   BMI 34.44 kg/m   She has macular areas scattered  over her skin with some being pigmented and occasionally reddish  Mild edema of Lower legs, mostly on the left leg  Assessment/Plan:   1. Reactive/postprandial hypoglycemia from dumping syndrome.  She has periodic symptoms, Now less frequent and only occasionally  She will continue her Precose unchanged, tolerating this well  Continue balanced meals with protein At each meal   2.  Generalized pruritus:  To follow-up with dermatologist.  She is having pigmented lesions and some itching.  3. Mild diabetes: A1c Is high normal and stable Is losing weight   4.  Pustules in right axilla and under right breast: She will try Bactroban  5.  EDEMA of the legs: This is likely to be from venous insufficiency Will  keep her on 100 mg of Aldactone and continue leg elevation  She can Lasix if not controlled  6.  HYPOTHYROID: Well controlled   Kemyah Buser 04/28/2016, 10:26 AM

## 2016-05-05 ENCOUNTER — Encounter (HOSPITAL_COMMUNITY): Payer: Self-pay

## 2016-05-05 DIAGNOSIS — M5442 Lumbago with sciatica, left side: Secondary | ICD-10-CM | POA: Diagnosis not present

## 2016-05-09 DIAGNOSIS — L303 Infective dermatitis: Secondary | ICD-10-CM | POA: Diagnosis not present

## 2016-05-16 ENCOUNTER — Other Ambulatory Visit: Payer: Self-pay | Admitting: Endocrinology

## 2016-05-19 ENCOUNTER — Ambulatory Visit (INDEPENDENT_AMBULATORY_CARE_PROVIDER_SITE_OTHER): Payer: Medicare Other | Admitting: General Practice

## 2016-05-19 DIAGNOSIS — Z5181 Encounter for therapeutic drug level monitoring: Secondary | ICD-10-CM

## 2016-05-19 LAB — POCT INR: INR: 1.5

## 2016-05-19 NOTE — Patient Instructions (Addendum)
Pre visit review using our clinic review tool, if applicable. No additional management support is needed unless otherwise documented below in the visit note.   INR low today.  Patient has been eating a lot of dark green leafy vegetables.  Encouraged patient to back off on greens, reiterated the risks of sub-therapeutic INR.    Boosted patient for 2 days and then to return to normal dosing.  Patient to return in 4 weeks for recheck.    Patient is having a spinal procedure on 11/10, surgeon has given instructions to patient on stopping the coumadin.  Instructions have been given to resume coumadin after surgery.   Patient verbalizes understanding.

## 2016-05-20 ENCOUNTER — Other Ambulatory Visit: Payer: Self-pay | Admitting: Endocrinology

## 2016-05-21 NOTE — Progress Notes (Signed)
I have reviewed and agree with the plan. 

## 2016-05-26 ENCOUNTER — Telehealth: Payer: Self-pay | Admitting: General Practice

## 2016-05-26 NOTE — Telephone Encounter (Signed)
-----   Message from Wyatt Portela, MD sent at 05/26/2016  8:34 AM EDT ----- Regarding: RE: Coumadin to be held I have no objection to stopping warfarin for 5 days before the procedure.   Thanks,   FS ----- Message ----- From: Elayne Snare, MD Sent: 05/26/2016   8:17 AM To: Warden Fillers, RN, Wyatt Portela, MD Subject: RE: Coumadin to be held                        Would like to confirm with her hematologist Dr. Alen Blew, has history of previous pulmonary embolism  ----- Message ----- From: Warden Fillers, RN Sent: 05/25/2016  12:04 PM To: Elayne Snare, MD Subject: Coumadin clearance                             Hi Dr. Dwyane Dee,  Patient is having a spinal injection scheduled for 11/10.  OK to stop coumadin for 5 days?  Villa Herb, RN

## 2016-05-30 ENCOUNTER — Telehealth: Payer: Self-pay | Admitting: General Practice

## 2016-05-30 NOTE — Telephone Encounter (Signed)
Instructed patient to take last dose of coumadin on 11/5 preparation for spinal injection on 06/09/16.  Instructions given to re-start coumadin after procedure.  Patient verbalized understanding.

## 2016-06-06 DIAGNOSIS — L303 Infective dermatitis: Secondary | ICD-10-CM | POA: Diagnosis not present

## 2016-06-06 DIAGNOSIS — B351 Tinea unguium: Secondary | ICD-10-CM | POA: Diagnosis not present

## 2016-06-09 DIAGNOSIS — M5137 Other intervertebral disc degeneration, lumbosacral region: Secondary | ICD-10-CM | POA: Diagnosis not present

## 2016-06-09 DIAGNOSIS — M47896 Other spondylosis, lumbar region: Secondary | ICD-10-CM | POA: Diagnosis not present

## 2016-06-09 DIAGNOSIS — M5136 Other intervertebral disc degeneration, lumbar region: Secondary | ICD-10-CM | POA: Diagnosis not present

## 2016-06-09 DIAGNOSIS — M545 Low back pain: Secondary | ICD-10-CM | POA: Diagnosis not present

## 2016-06-15 DIAGNOSIS — L732 Hidradenitis suppurativa: Secondary | ICD-10-CM | POA: Diagnosis not present

## 2016-06-16 ENCOUNTER — Ambulatory Visit (INDEPENDENT_AMBULATORY_CARE_PROVIDER_SITE_OTHER): Payer: Medicare Other | Admitting: General Practice

## 2016-06-16 DIAGNOSIS — Z5181 Encounter for therapeutic drug level monitoring: Secondary | ICD-10-CM

## 2016-06-16 LAB — POCT INR: INR: 1.4

## 2016-06-16 NOTE — Progress Notes (Signed)
I have reviewed and agree with the plan. 

## 2016-06-16 NOTE — Patient Instructions (Signed)
Pre visit review using our clinic review tool, if applicable. No additional management support is needed unless otherwise documented below in the visit note. 

## 2016-06-17 ENCOUNTER — Other Ambulatory Visit: Payer: Self-pay | Admitting: Endocrinology

## 2016-06-26 ENCOUNTER — Telehealth: Payer: Self-pay | Admitting: Endocrinology

## 2016-06-26 NOTE — Telephone Encounter (Signed)
Patient need a refill of medication Bacclofen  [facility] - [place], [place] - [place] [phone] (Phone) [phone] (Fax)

## 2016-06-28 ENCOUNTER — Other Ambulatory Visit: Payer: Self-pay | Admitting: Endocrinology

## 2016-06-29 ENCOUNTER — Other Ambulatory Visit: Payer: Self-pay

## 2016-06-29 MED ORDER — BACLOFEN 10 MG PO TABS: ORAL_TABLET | ORAL | 1 refills | Status: DC

## 2016-06-29 NOTE — Telephone Encounter (Signed)
Filled 06/29/16 

## 2016-07-07 ENCOUNTER — Other Ambulatory Visit: Payer: Self-pay

## 2016-07-12 DIAGNOSIS — L732 Hidradenitis suppurativa: Secondary | ICD-10-CM | POA: Diagnosis not present

## 2016-07-14 ENCOUNTER — Ambulatory Visit (INDEPENDENT_AMBULATORY_CARE_PROVIDER_SITE_OTHER): Payer: Medicare Other | Admitting: General Practice

## 2016-07-14 DIAGNOSIS — Z5181 Encounter for therapeutic drug level monitoring: Secondary | ICD-10-CM | POA: Diagnosis not present

## 2016-07-14 LAB — POCT INR: INR: 1.7

## 2016-07-14 NOTE — Patient Instructions (Signed)
Pre visit review using our clinic review tool, if applicable. No additional management support is needed unless otherwise documented below in the visit note. 

## 2016-07-16 ENCOUNTER — Other Ambulatory Visit: Payer: Self-pay | Admitting: Endocrinology

## 2016-07-16 NOTE — Progress Notes (Signed)
I have reviewed and agree with the plan. 

## 2016-07-18 ENCOUNTER — Ambulatory Visit (INDEPENDENT_AMBULATORY_CARE_PROVIDER_SITE_OTHER): Payer: Medicare Other | Admitting: Ophthalmology

## 2016-07-21 ENCOUNTER — Ambulatory Visit (INDEPENDENT_AMBULATORY_CARE_PROVIDER_SITE_OTHER): Payer: Medicare Other | Admitting: Ophthalmology

## 2016-07-21 DIAGNOSIS — H33302 Unspecified retinal break, left eye: Secondary | ICD-10-CM

## 2016-07-21 DIAGNOSIS — H43811 Vitreous degeneration, right eye: Secondary | ICD-10-CM

## 2016-07-21 DIAGNOSIS — I10 Essential (primary) hypertension: Secondary | ICD-10-CM

## 2016-07-21 DIAGNOSIS — H353121 Nonexudative age-related macular degeneration, left eye, early dry stage: Secondary | ICD-10-CM

## 2016-07-21 DIAGNOSIS — H35033 Hypertensive retinopathy, bilateral: Secondary | ICD-10-CM | POA: Diagnosis not present

## 2016-07-21 DIAGNOSIS — H348322 Tributary (branch) retinal vein occlusion, left eye, stable: Secondary | ICD-10-CM | POA: Diagnosis not present

## 2016-07-21 DIAGNOSIS — H353211 Exudative age-related macular degeneration, right eye, with active choroidal neovascularization: Secondary | ICD-10-CM | POA: Diagnosis not present

## 2016-07-21 DIAGNOSIS — H35372 Puckering of macula, left eye: Secondary | ICD-10-CM | POA: Diagnosis not present

## 2016-08-01 ENCOUNTER — Other Ambulatory Visit (INDEPENDENT_AMBULATORY_CARE_PROVIDER_SITE_OTHER): Payer: Medicare Other

## 2016-08-01 DIAGNOSIS — E782 Mixed hyperlipidemia: Secondary | ICD-10-CM

## 2016-08-01 DIAGNOSIS — E1142 Type 2 diabetes mellitus with diabetic polyneuropathy: Secondary | ICD-10-CM | POA: Diagnosis not present

## 2016-08-01 LAB — COMPREHENSIVE METABOLIC PANEL
ALBUMIN: 4.1 g/dL (ref 3.5–5.2)
ALT: 32 U/L (ref 0–35)
AST: 30 U/L (ref 0–37)
Alkaline Phosphatase: 84 U/L (ref 39–117)
BILIRUBIN TOTAL: 0.4 mg/dL (ref 0.2–1.2)
BUN: 18 mg/dL (ref 6–23)
CALCIUM: 9.6 mg/dL (ref 8.4–10.5)
CO2: 32 mEq/L (ref 19–32)
CREATININE: 0.89 mg/dL (ref 0.40–1.20)
Chloride: 102 mEq/L (ref 96–112)
GFR: 80.96 mL/min (ref >60.00)
Glucose, Bld: 117 mg/dL — ABNORMAL HIGH (ref 70–99)
Potassium: 4.3 mEq/L (ref 3.5–5.1)
SODIUM: 138 meq/L (ref 135–145)
TOTAL PROTEIN: 7.2 g/dL (ref 6.0–8.3)

## 2016-08-01 LAB — LIPID PANEL
CHOLESTEROL: 164 mg/dL (ref 0–200)
HDL: 86.8 mg/dL (ref >39.00)
LDL CALC: 68 mg/dL (ref 0–99)
NonHDL: 77.48
TRIGLYCERIDES: 47 mg/dL (ref 0.0–149.0)
Total CHOL/HDL Ratio: 2
VLDL: 9.4 mg/dL (ref 0.0–40.0)

## 2016-08-01 LAB — URINALYSIS, ROUTINE W REFLEX MICROSCOPIC
Bilirubin Urine: NEGATIVE
KETONES UR: NEGATIVE
Nitrite: NEGATIVE
RBC / HPF: NONE SEEN (ref 0–?)
Specific Gravity, Urine: 1.01 (ref 1.000–1.030)
Total Protein, Urine: NEGATIVE
Urine Glucose: NEGATIVE
Urobilinogen, UA: 0.2 (ref 0.0–1.0)
pH: 5.5 (ref 5.0–8.0)

## 2016-08-01 LAB — MICROALBUMIN / CREATININE URINE RATIO
CREATININE, U: 54.9 mg/dL
Microalb Creat Ratio: 1.3 mg/g (ref 0.0–30.0)

## 2016-08-01 LAB — HEMOGLOBIN A1C: Hgb A1c MFr Bld: 6.5 % (ref 4.6–6.5)

## 2016-08-03 ENCOUNTER — Ambulatory Visit (INDEPENDENT_AMBULATORY_CARE_PROVIDER_SITE_OTHER): Payer: Medicare Other | Admitting: Endocrinology

## 2016-08-03 ENCOUNTER — Encounter: Payer: Self-pay | Admitting: Endocrinology

## 2016-08-03 VITALS — BP 122/70 | HR 76 | Ht 69.29 in | Wt 235.4 lb

## 2016-08-03 DIAGNOSIS — K911 Postgastric surgery syndromes: Secondary | ICD-10-CM

## 2016-08-03 DIAGNOSIS — E063 Autoimmune thyroiditis: Secondary | ICD-10-CM

## 2016-08-03 DIAGNOSIS — E038 Other specified hypothyroidism: Secondary | ICD-10-CM | POA: Diagnosis not present

## 2016-08-03 DIAGNOSIS — R2689 Other abnormalities of gait and mobility: Secondary | ICD-10-CM | POA: Diagnosis not present

## 2016-08-03 DIAGNOSIS — G609 Hereditary and idiopathic neuropathy, unspecified: Secondary | ICD-10-CM

## 2016-08-03 DIAGNOSIS — E1142 Type 2 diabetes mellitus with diabetic polyneuropathy: Secondary | ICD-10-CM | POA: Diagnosis not present

## 2016-08-03 NOTE — Progress Notes (Signed)
Patient ID: Kelli Martinez, female   DOB: 08-07-1945, 71 y.o.   MRN: [id number]    Chief complaint: Followup of various issues   History of Present Illness:  The patient is seen for the following problems:  1. Postprandial hypoglycemia related to dumping syndrome   She has had postprandial hypoglycemia starting a couple of years after her gastric bypass surgery Her symptoms of blood sugars are mostly a feeling of significant weakness, some shakiness.  She will use fruits or juice in order to relieve the symptoms  She has been treated with acarbose with improvement. She did try verapamil empirically but this did not help.    Recent history:  She has been to the dietitian and has been instructed on balanced low-fat meals with enough protein consistently and restricting carbohydrates including fruits.  She is taking her Precose usually right before eating, 2 tablets at breakfast and 1 at dinner  For breakfast she will have bacon or eggs in the morning usually with fruit and occasionally cereal   She usually has a protein at lunch and dinner   Recently she believes she has had occasional mild episodes of low sugars but she does not check her sugar at that time and just tries to eat something or take a candy when she feels weak with some improvement   Wt Readings from Last 3 Encounters:  08/03/16 235 lb 6.4 oz (106.8 kg)  04/28/16 223 lb 3.2 oz (101.2 kg)  01/27/16 230 lb (104.3 kg)   2.  History of diabetes:   Overall blood sugars have been well controlled since her bariatric surgery. Usually has a normal A1c readings around 6.2-6.6 Has not checked her blood sugars much recently, only has 3 readings with fasting reading 101, midday 90 and after lunch 138   Currently on Precose as above  Recently not able to lose weight, unable to exercise because of her back pain  Meal portion size is usually well controlled    Lab Results  Component Value Date   HGBA1C 6.5  08/01/2016   HGBA1C 6.5 04/24/2016   HGBA1C 6.6 10/20/2015   Lab Results  Component Value Date   MICROALBUR <0.7 08/01/2016   LDLCALC 68 08/01/2016   CREATININE 0.89 08/01/2016      Other active problems: see review of systems      Allergies as of 08/03/2016      Reactions   Adhesive [tape] Rash      Medication List       Accurate as of 08/03/16 11:18 AM. Always use your most recent med list.          acarbose 25 MG tablet Commonly known as:  PRECOSE TAKE 1 TABLET THREE TIMES A DAY   albuterol 108 (90 Base) MCG/ACT inhaler Commonly known as:  PROAIR HFA USE 2 INHALATIONS FOUR TIMES A DAY AS NEEDED ( AS NEEDED RESCUE INHALER)   atorvastatin 20 MG tablet Commonly known as:  LIPITOR TAKE 1 TABLET DAILY   baclofen 10 MG tablet Commonly known as:  LIORESAL Take 1 tablet 4 times daily   clobetasol cream 0.05 % Commonly known as:  TEMOVATE Apply 1 application topically 2 (two) times daily.   ferrous sulfate 325 (65 FE) MG tablet Take 325 mg by mouth daily.   fluticasone 50 MCG/ACT nasal spray Commonly known as:  FLONASE Place 2 sprays into both nostrils daily.   Fluticasone-Salmeterol 100-50 MCG/DOSE Aepb Commonly known as:  ADVAIR DISKUS USE 1 INHALATION EVERY  12 HOURS, RINSE MOUTH   freestyle lancets USE AS INSTRUCTED TO CHECK BLOOD SUGAR ONCE DAILY   FREESTYLE LITE Devi Use to check blood sugar once a day DX code E11.65   FREESTYLE FREEDOM LITE w/Device Kit use as directed   FREESTYLE LITE test strip Generic drug:  glucose blood USE AS INSTRUCTED TO CHECK BLOOD SUGAR ONCE DAILY   furosemide 40 MG tablet Commonly known as:  LASIX TAKE 1 TABLET EVERY MORNING   gabapentin 300 MG capsule Commonly known as:  NEURONTIN TAKE 1 CAPSULE FOUR TIMES A DAY   HYDROcodone-acetaminophen 5-325 MG tablet Commonly known as:  NORCO Take 1 tablet by mouth 2 (two) times daily as needed for moderate pain.   hydrOXYzine 25 MG tablet Commonly known as:   ATARAX/VISTARIL Take 1 tablet (25 mg total) by mouth 3 (three) times daily as needed.   KLOR-CON M20 20 MEQ tablet Generic drug:  potassium chloride SA TAKE 1 TABLET DAILY   levothyroxine 125 MCG tablet Commonly known as:  SYNTHROID, LEVOTHROID TAKE 1 TABLET DAILY BEFORE BREAKFAST   magnesium chloride 64 MG Tbec SR tablet Commonly known as:  SLOW-MAG Take 1 tablet (64 mg total) by mouth 2 (two) times daily.   methylcellulose 1 % ophthalmic solution Commonly known as:  ARTIFICIAL TEARS Place 1 drop into both eyes as needed. Dry eyes   mupirocin cream 2 % Commonly known as:  BACTROBAN Apply 1 application topically 2 (two) times daily.   NON FORMULARY CPAP 6 [employer]   omeprazole 20 MG capsule Commonly known as:  PRILOSEC TAKE 1 CAPSULE DAILY   rOPINIRole 1 MG tablet Commonly known as:  REQUIP Take 1 mg by mouth at bedtime. Reported on 01/27/2016   spironolactone 50 MG tablet Commonly known as:  ALDACTONE TAKE 1 TABLET TWICE A DAY   sulfamethoxazole-trimethoprim 800-160 MG tablet Commonly known as:  BACTRIM DS,SEPTRA DS Take 1 tablet by mouth 2 (two) times daily.   temazepam 15 MG capsule Commonly known as:  RESTORIL 1-2 for sleep as needed   triamcinolone cream 0.1 % Commonly known as:  KENALOG Apply 1 application topically 2 (two) times daily.   vitamin B-12 500 MCG tablet Commonly known as:  CYANOCOBALAMIN Take 500 mcg by mouth daily.   Vitamin D (Ergocalciferol) 50000 units Caps capsule Commonly known as:  DRISDOL TAKE 1 CAPSULE EVERY FRIDAY   warfarin 10 MG tablet Commonly known as:  COUMADIN One tablet Mon, Wed, Fri   warfarin 5 MG tablet Commonly known as:  COUMADIN TAKE 2 TABLETS ONCE DAILY (THIS IS TO REPLACE THE 10 MG TABLET)       Allergies:  Allergies  Allergen Reactions  . Adhesive [Tape] Rash    Past Medical History:  Diagnosis Date  . Allergy   . Anemia   . Arthritis   . Asthma   . Back pain, chronic    GETS INJECTIONS IN  BACK  . Blind right eye    hemorrhage  . Blood transfusion   . Cancer ([place])    breast 1994  . Clotting disorder ([place])   . Diabetes mellitus    hypoglycemia since large wt loss  . Elevated liver enzymes   . Gallstones   . GERD (gastroesophageal reflux disease)   . HX: breast cancer   . Hyperlipidemia   . Hypertension   . Hypothyroid   . Lymphedema of arm    RT  . Morbid obesity ([place])   . OSA (obstructive sleep apnea)  uses c pap  . Osteoporosis   . Psoriasis   . Pulmonary embolism ([place]) 1998 / 1994 /1968  . Syncope and collapse 2011   due to anemia    Past Surgical History:  Procedure Laterality Date  . ABDOMINAL HYSTERECTOMY    . BONE MARROW TRANSPLANT  1994  . CARDIOVASCULAR STRESS TEST  05/23/2005   EF 53%  . carpal tunnell     bil  . cataracts    . CHOLECYSTECTOMY  05/10/2012   Procedure: LAPAROSCOPIC CHOLECYSTECTOMY WITH INTRAOPERATIVE CHOLANGIOGRAM;  Surgeon: Pedro Earls, MD;  Location: [facility];  Service: General;  Laterality: N/A;  Laparoscopic Cholecystectomy with Intraoperative Cholangiogram  . GASTRIC BYPASS  2011   bariatric surgery  . HIATAL HERNIA REPAIR    . IVC filter     recurrent DVT  . KNEE ARTHROTOMY  1998  . MASTECTOMY     Right  . US ECHOCARDIOGRAPHY  10/27/08   EF 55-60%    Family History  Problem Relation Age of Onset  . Sudden death Mother     car accident  . Cancer Father 14    lung  . Cancer Sister 67    lung cancer  . Asthma Daughter     Social History:  reports that she quit smoking about 38 years ago. Her smoking use included Cigarettes. She has a 5.00 pack-year smoking history. She has never used smokeless tobacco. She reports that she does not drink alcohol or use drugs.  Review of Systems   Her main complaint today is BURNING in her feet especially the right side and this can happen anytime. Also thinks that she is unsteady with her balance and may tend to run into things but has not fallen. Some tingling has also  occurred more recently in her hands   EDEMA: She has had problems with  edema both in her legs and right arm Has been on 50 mg bid of Aldactone   She has taken Lasix Daily now even though she was not having more swelling She has not been using the compression stockings as prescribed   Lymphedema right arm, secondary to  breast surgery, using  elastic compression stocking   History of mild hypothyroidism: Adequately replaced  with 62.5 mcg ,  has consistently normal TSH   Lab Results  Component Value Date   TSH 2.35 04/24/2016    Hypercholesterolemia: LDL has been at target with Lipitor 10 mg Taking this also for cardiovascular prophylaxis Recent levels:  Lab Results  Component Value Date   CHOL 164 08/01/2016   HDL 86.80 08/01/2016   LDLCALC 68 08/01/2016   TRIG 47.0 08/01/2016   CHOLHDL 2 08/01/2016    Previous history of hypertension: Currently not on any medications except diuretics.  No lightheadedness although sometimes they feel weak when standing up  BP Readings from Last 3 Encounters:  08/03/16 122/70  04/28/16 130/70  02/20/16 102/72     She has had Recurrent sciatica and back pain, followed by orthopedic surgeon    LABS:  Lab on 08/01/2016  Component Date Value Ref Range Status  . Hgb A1c MFr Bld 08/01/2016 6.5  4.6 - 6.5 % Final  . Sodium 08/01/2016 138  135 - 145 mEq/L Final  . Potassium 08/01/2016 4.3  3.5 - 5.1 mEq/L Final  . Chloride 08/01/2016 102  96 - 112 mEq/L Final  . CO2 08/01/2016 32  19 - 32 mEq/L Final  . Glucose, Bld 08/01/2016 117* 70 - 99 mg/dL  Final  . BUN 08/01/2016 18  6 - 23 mg/dL Final  . Creatinine, Ser 08/01/2016 0.89  0.40 - 1.20 mg/dL Final  . Total Bilirubin 08/01/2016 0.4  0.2 - 1.2 mg/dL Final  . Alkaline Phosphatase 08/01/2016 84  39 - 117 U/L Final  . AST 08/01/2016 30  0 - 37 U/L Final  . ALT 08/01/2016 32  0 - 35 U/L Final  . Total Protein 08/01/2016 7.2  6.0 - 8.3 g/dL Final  . Albumin 08/01/2016 4.1  3.5 - 5.2  g/dL Final  . Calcium 08/01/2016 9.6  8.4 - 10.5 mg/dL Final  . GFR 08/01/2016 80.96  >60.00 mL/min Final  . Microalb, Ur 08/01/2016 <0.7  0.0 - 1.9 mg/dL Final  . Creatinine,U 08/01/2016 54.9  mg/dL Final  . Microalb Creat Ratio 08/01/2016 1.3  0.0 - 30.0 mg/g Final  . Color, Urine 08/01/2016 YELLOW  Yellow;Lt. Yellow Final  . APPearance 08/01/2016 CLEAR  Clear Final  . Specific Gravity, Urine 08/01/2016 1.010  1.000 - 1.030 Final  . pH 08/01/2016 5.5  5.0 - 8.0 Final  . Total Protein, Urine 08/01/2016 NEGATIVE  Negative Final  . Urine Glucose 08/01/2016 NEGATIVE  Negative Final  . Ketones, ur 08/01/2016 NEGATIVE  Negative Final  . Bilirubin Urine 08/01/2016 NEGATIVE  Negative Final  . Hgb urine dipstick 08/01/2016 TRACE-LYSED* Negative Final  . Urobilinogen, UA 08/01/2016 0.2  0.0 - 1.0 Final  . Leukocytes, UA 08/01/2016 TRACE* Negative Final  . Nitrite 08/01/2016 NEGATIVE  Negative Final  . WBC, UA 08/01/2016 0-2/hpf  0-2/hpf Final  . RBC / HPF 08/01/2016 none seen  0-2/hpf Final  . Mucus, UA 08/01/2016 Presence of* None Final  . Squamous Epithelial / LPF 08/01/2016 Rare(0-4/hpf)  Rare(0-4/hpf) Final  . Cholesterol 08/01/2016 164  0 - 200 mg/dL Final  . Triglycerides 08/01/2016 47.0  0.0 - 149.0 mg/dL Final  . HDL 08/01/2016 86.80  >39.00 mg/dL Final  . VLDL 08/01/2016 9.4  0.0 - 40.0 mg/dL Final  . LDL Cholesterol 08/01/2016 68  0 - 99 mg/dL Final  . Total CHOL/HDL Ratio 08/01/2016 2   Final  . NonHDL 08/01/2016 77.48   Final  Anti-coag visit on 07/14/2016  Component Date Value Ref Range Status  . INR 07/14/2016 1.7   Final     EXAM:  BP 122/70   Pulse 76   Ht 5' 9.29" (1.76 m)   Wt 235 lb 6.4 oz (106.8 kg)   SpO2 96%   BMI 34.47 kg/m   Romberg sign mildly positive  Mild swelling apparent of Lower legs, mostly on the left lower leg  Assessment/Plan:   1. Reactive/postprandial hypoglycemia from dumping syndrome.  She has periodic symptoms, these are relatively  mild and although she thinks she gets symptomatic with weakness she has not documented any low blood sugars She tries to get some simple carbohydrate or candy for treatment  She will continue her Precose unchanged, tolerating this well  Continue balanced meals with protein At each meal   2.  NEUROPATHY: This is out of proportion to her diabetes and is getting more symptoms especially of burning in her feet and giving her hands.  Also not clear if she has gait imbalance related to neuropathy and is getting more symptoms now Has been on B12 supplements  Will refer her to a neurologist  3. Mild diabetes: A1c Is high normal and stable Continue Precose  4.  Chronic back pain/sciatica: Continue follow-up with orthopedic surgeon  5.  EDEMA  of the legs: This is likely to be from venous insufficiency Will  keep her on 100 mg of Aldactone and encouraged elevation of the leg However told her not to take Lasix every day because of potential for orthostatic hypotension She will take this every other day and more only if having significant edema   6.  HYPERCHOLESTEROLEMIA: Well controlled   Aprill Banko 08/03/2016, 11:18 AM

## 2016-08-03 NOTE — Patient Instructions (Addendum)
Gabapentin 2 at a time  Check blood sugars on waking up  1x weekly  Also check blood sugars about 2 hours after a meal and do this after different meals by rotation  Recommended blood sugar levels on waking up is 90-130 and about 2 hours after meal is 130-160  Please bring your blood sugar monitor to each visit, thank you  Take Lasix every 2 days

## 2016-08-11 ENCOUNTER — Other Ambulatory Visit: Payer: Self-pay | Admitting: Endocrinology

## 2016-08-11 ENCOUNTER — Ambulatory Visit (INDEPENDENT_AMBULATORY_CARE_PROVIDER_SITE_OTHER): Payer: Medicare Other | Admitting: General Practice

## 2016-08-11 DIAGNOSIS — Z5181 Encounter for therapeutic drug level monitoring: Secondary | ICD-10-CM

## 2016-08-11 LAB — POCT INR: INR: 1.8

## 2016-08-11 NOTE — Progress Notes (Signed)
I have reviewed and agree with the plan. 

## 2016-08-11 NOTE — Patient Instructions (Signed)
Pre visit review using our clinic review tool, if applicable. No additional management support is needed unless otherwise documented below in the visit note. 

## 2016-08-24 ENCOUNTER — Telehealth: Payer: Self-pay | Admitting: Endocrinology

## 2016-08-24 NOTE — Telephone Encounter (Signed)
Please print prescription

## 2016-08-24 NOTE — Telephone Encounter (Signed)
Hydrocodone needs to be refilled please

## 2016-08-25 ENCOUNTER — Other Ambulatory Visit: Payer: Self-pay

## 2016-08-25 MED ORDER — HYDROCODONE-ACETAMINOPHEN 5-325 MG PO TABS: 1.0000 | ORAL_TABLET | Freq: Two times a day (BID) | ORAL | 0 refills | Status: DC | PRN

## 2016-08-25 NOTE — Telephone Encounter (Signed)
Ordered 08/25/16 

## 2016-08-31 ENCOUNTER — Encounter: Payer: Self-pay | Admitting: Internal Medicine

## 2016-08-31 ENCOUNTER — Ambulatory Visit (INDEPENDENT_AMBULATORY_CARE_PROVIDER_SITE_OTHER): Payer: Medicare Other | Admitting: Internal Medicine

## 2016-08-31 VITALS — BP 108/72 | HR 64 | Ht 68.0 in | Wt 233.6 lb

## 2016-08-31 DIAGNOSIS — J3089 Other allergic rhinitis: Secondary | ICD-10-CM | POA: Diagnosis not present

## 2016-08-31 DIAGNOSIS — F5101 Primary insomnia: Secondary | ICD-10-CM

## 2016-08-31 DIAGNOSIS — G4733 Obstructive sleep apnea (adult) (pediatric): Secondary | ICD-10-CM

## 2016-08-31 DIAGNOSIS — H6980 Other specified disorders of Eustachian tube, unspecified ear: Secondary | ICD-10-CM | POA: Diagnosis not present

## 2016-08-31 DIAGNOSIS — J4530 Mild persistent asthma, uncomplicated: Secondary | ICD-10-CM

## 2016-08-31 DIAGNOSIS — J302 Other seasonal allergic rhinitis: Secondary | ICD-10-CM

## 2016-08-31 MED ORDER — TEMAZEPAM 15 MG PO CAPS: ORAL_CAPSULE | ORAL | 5 refills | Status: DC

## 2016-08-31 MED ORDER — METHYLPREDNISOLONE ACETATE 80 MG/ML IJ SUSP
80.0000 mg | Freq: Once | INTRAMUSCULAR | Status: AC
  Administered 2016-08-31: 80 mg via INTRAMUSCULAR

## 2016-08-31 MED ORDER — PHENYLEPHRINE HCL 1 % NA SOLN
3.0000 [drp] | Freq: Once | NASAL | Status: AC
  Administered 2016-08-31: 3 [drp] via NASAL

## 2016-08-31 MED ORDER — FLUTICASONE-SALMETEROL 100-50 MCG/DOSE IN AEPB: INHALATION_SPRAY | RESPIRATORY_TRACT | 3 refills | Status: DC

## 2016-08-31 MED ORDER — ALBUTEROL SULFATE HFA 108 (90 BASE) MCG/ACT IN AERS: INHALATION_SPRAY | RESPIRATORY_TRACT | 3 refills | Status: DC

## 2016-08-31 MED ORDER — FLUTICASONE PROPIONATE 50 MCG/ACT NA SUSP: 2.0000 | Freq: Every day | NASAL | 3 refills | Status: DC

## 2016-08-31 NOTE — Patient Instructions (Signed)
Neb neo nasal     Dx eustachian dysfunction  Depo 80  Script printed for temazepam  Scripts sent refilling Proair, Advair and Flonase

## 2016-08-31 NOTE — Progress Notes (Signed)
Subjective:    Patient ID: Kelli Martinez, female    DOB: 02/24/1946, 71 y.o.   MRN: [id number]  HPI F former smoker followed for OSA/ quit CPAP, allergic rhinitis,asthma,  hx pulm embolism/DVT/filter/coumadin(Dr Dwyane Dee), hx R breast Ca/ mastectomy/ xrt/chem/bonemarrow transplant at [facility], hx gastric bypass Office Spirometry 04/27/2015-WNL-FEV1/FVC 0.81, FEV1 2.50/110%  -----------------------------------------------------------------------------------------  04/27/15- 69 yoF former smoker followed for OSA/ quit CPAP, allergic rhinitis,asthma,  hx pulm embolism/DVT/filter/coumadin(Dr Dwyane Dee), hx R breast Ca/ mastectomy/ xrt/chem/bonemarrow transplant at [place], hx gastric bypass FOLLOWS FOR: Not wearing CPAP 6/ Advanced for months now-feels she sleeps fine without it. DME is [facility]; pt told them to stop sending supplies.    Had flu shot She lives alone with no body to describe snoring. Rarely needs naps because she does not feel sleepy in the daytime. No routine wheeze or cough. Occasional sinus congestion managed with Flonase. Office Spirometry 04/27/2015-WNL-FEV1/FVC 0.81, FEV1 2.50/110% CT chest 11/05/14-reviewed IMPRESSION: Status post right mastectomy with right axillary lymph node dissection. Radiation changes in the right upper lobe. No evidence of recurrent or metastatic disease. Postsurgical changes related to gastric bypass, cholecystectomy, and hysterectomy. IVC filter. Additional ancillary findings as above. Electronically Signed  By: Julian Hy M.D.  On: 11/05/2014 10:25  08/31/2016-70 yoF former smoker followed for OSA/ quit CPAP, allergic rhinitis,asthma,  hx pulm embolism/DVT/filter/coumadin(Dr Dwyane Dee), hx R breast Ca/ mastectomy/ xrt/chem/bonemarrow transplant at [place], hx gastric bypass FOLLOWS FOR:  Pt is not wearing CPAP machine, pt states that she had trouble sleeping with it and was waking several times per night. Pt also c/o nasal and throat dryness.  Occasional  minimal chest tightness and dry cough with scant clear mucus. Using rescue inhaler 2 or 3 times a week. Continues Advair. Flonase helps her nasal symptoms with some stuffiness. Seeing neurology about "unsteadiness" with feeling that "head is in a vise". Not clear if she is describing eustachian dysfunction with vertigo. Continues temazepam which has helped a lot, well tolerated for insomnia.  Review of Systems- see HPI Constitutional:   No-   weight loss, night sweats, fevers, chills, fatigue, lassitude. HEENT:   No-  headaches, difficulty swallowing, tooth/dental problems, sore throat,   +dry mouth      No-  sneezing, itching, ear ache,  +nasal congestion, post nasal drip,  CV:  No-   chest pain, orthopnea, PND, swelling in lower extremities, anasarca, dizziness, palpitations Resp: No-   shortness of breath with exertion or at rest.              No-   productive cough,  + non-productive cough,  No-  coughing up of blood.              No-   change in color of mucus.   Skin: No-   rash or lesions. GI:  No-   heartburn, indigestion, abdominal pain, nausea, vomiting,  GU:  MS:  No-   joint pain,  +  swelling.   Neuro- : +HPI Psych:  No- change in mood or affect. No depression or anxiety.  No memory loss.   Objective:   Physical Exam General- Alert, Oriented, Affect-appropriate, Distress- none acute  + obese Skin- rash-none, lesions- none, excoriation- none. XRT skin changes right lateral neck Lymphadenopathy- none Head- atraumatic            Eyes- Gross vision intact, PERRLA, conjunctivae clear secretions            Ears- Hearing, canals, TMs normal  Nose- Clear, no- Septal dev, + sticky mucus, polyps, erosion, perforation             Throat- Mallampati III , mucosa clear , drainage- none, tonsils- atrophic, missing teeth Neck- flexible , trachea midline, no stridor , thyroid nl, carotid no bruit Chest - symmetrical excursion , unlabored           Heart/CV- RRR , no murmur , no  gallop  , no rub, nl s1 s2                           - JVD- none , edema+L>R, stasis changes- none, varices- none           Lung- clear to P&A, wheeze- none, cough- none , dullness-none, rub- none           Chest wall- + right mastectomy Abd-  Br/ Gen/ Rectal- Not done, not indicated Extrem- cyanosis- none, clubbing, none, atrophy- none, strength- nl   Heavy legs. +Lymphedema right forearm/ elastic sleeve Neuro- grossly intact to observation  Assessment & Plan:

## 2016-09-02 NOTE — Assessment & Plan Note (Signed)
She failed to tolerate CPAP and has not wished to pursue this issue. I reemphasized the importance of keeping weight down, sleeping off flat of back, being alert to drive

## 2016-09-02 NOTE — Assessment & Plan Note (Signed)
We reviewed basics of good sleep hygiene again. Discussed impact of her mild, untreated OSA since she does not want this treated now. Plan-refill temazepam with discussion

## 2016-09-02 NOTE — Assessment & Plan Note (Addendum)
Not clear if her "unsteadiness" reflex nasal congestion and eustachian dysfunction. Plan-nasal nebulizer decongestant treatment, Depo-Medrol with steroid/glucose discussion, Flonase refilled

## 2016-09-02 NOTE — Assessment & Plan Note (Signed)
We discussed guidelines for use of her rescue and maintenance inhalers. I think her control is sufficient currently.

## 2016-09-05 DIAGNOSIS — M79672 Pain in left foot: Secondary | ICD-10-CM | POA: Diagnosis not present

## 2016-09-05 DIAGNOSIS — G5762 Lesion of plantar nerve, left lower limb: Secondary | ICD-10-CM | POA: Diagnosis not present

## 2016-09-05 DIAGNOSIS — M792 Neuralgia and neuritis, unspecified: Secondary | ICD-10-CM | POA: Diagnosis not present

## 2016-09-05 DIAGNOSIS — M25572 Pain in left ankle and joints of left foot: Secondary | ICD-10-CM | POA: Diagnosis not present

## 2016-09-08 ENCOUNTER — Ambulatory Visit (INDEPENDENT_AMBULATORY_CARE_PROVIDER_SITE_OTHER): Payer: Medicare Other | Admitting: General Practice

## 2016-09-08 DIAGNOSIS — Z5181 Encounter for therapeutic drug level monitoring: Secondary | ICD-10-CM | POA: Diagnosis not present

## 2016-09-08 LAB — POCT INR: INR: 1.7

## 2016-09-08 NOTE — Progress Notes (Signed)
I have reviewed and agree with the plan. 

## 2016-09-08 NOTE — Patient Instructions (Signed)
Pre visit review using our clinic review tool, if applicable. No additional management support is needed unless otherwise documented below in the visit note. 

## 2016-09-12 DIAGNOSIS — D2371 Other benign neoplasm of skin of right lower limb, including hip: Secondary | ICD-10-CM | POA: Diagnosis not present

## 2016-09-12 DIAGNOSIS — G5762 Lesion of plantar nerve, left lower limb: Secondary | ICD-10-CM | POA: Diagnosis not present

## 2016-09-12 DIAGNOSIS — M65872 Other synovitis and tenosynovitis, left ankle and foot: Secondary | ICD-10-CM | POA: Diagnosis not present

## 2016-09-12 DIAGNOSIS — L303 Infective dermatitis: Secondary | ICD-10-CM | POA: Diagnosis not present

## 2016-09-12 DIAGNOSIS — D2372 Other benign neoplasm of skin of left lower limb, including hip: Secondary | ICD-10-CM | POA: Diagnosis not present

## 2016-09-14 ENCOUNTER — Telehealth: Payer: Self-pay | Admitting: Internal Medicine

## 2016-09-14 MED ORDER — PREDNISONE 10 MG PO TABS: ORAL_TABLET | ORAL | 0 refills | Status: DC

## 2016-09-14 MED ORDER — AZITHROMYCIN 250 MG PO TABS: ORAL_TABLET | ORAL | 0 refills | Status: DC

## 2016-09-14 NOTE — Telephone Encounter (Signed)
Called and spoke with pt and she stated that she has been sick for almost a week.  She had a temp on Tuesday of 102.2.  She stated that she has a dry cough that is non productive, PND, feels very weak, nasal and head congestion.  Pt stated that she doesn't want to get out of bed.  Requesting that something be sent to her pharmacy.  CY please advise. Thanks  Last seen 08/31/16 Next ov--08/31/17  Allergies  Allergen Reactions  . Adhesive [Tape] Rash

## 2016-09-14 NOTE — Telephone Encounter (Signed)
Spoke with pt. And made her aware of CY message, pt. Agreed to the medication being called into her pharmacy. The rx was sent to her pharmacy of choice. Nothing further is needed a this time.

## 2016-09-14 NOTE — Telephone Encounter (Signed)
Might be flu, but too late for Tamiflu.  Offer Zpak 250 mg, # 6, 2 today then one daily            Prednisone 10 mg, # 20, 4 X 2 DAYS, 3 X 2 DAYS, 2 X 2 DAYS, 1 X 2 DAYS  Stay well-hydrated

## 2016-09-26 DIAGNOSIS — G5762 Lesion of plantar nerve, left lower limb: Secondary | ICD-10-CM | POA: Diagnosis not present

## 2016-10-06 ENCOUNTER — Ambulatory Visit (INDEPENDENT_AMBULATORY_CARE_PROVIDER_SITE_OTHER): Payer: Medicare Other | Admitting: General Practice

## 2016-10-06 DIAGNOSIS — Z5181 Encounter for therapeutic drug level monitoring: Secondary | ICD-10-CM | POA: Diagnosis not present

## 2016-10-06 LAB — POCT INR: INR: 4

## 2016-10-06 NOTE — Progress Notes (Signed)
I have reviewed and agree with the plan. 

## 2016-10-06 NOTE — Patient Instructions (Signed)
Pre visit review using our clinic review tool, if applicable. No additional management support is needed unless otherwise documented below in the visit note. 

## 2016-10-07 ENCOUNTER — Other Ambulatory Visit: Payer: Self-pay | Admitting: Endocrinology

## 2016-10-09 ENCOUNTER — Ambulatory Visit (INDEPENDENT_AMBULATORY_CARE_PROVIDER_SITE_OTHER): Payer: Medicare Other | Admitting: Neurology

## 2016-10-09 ENCOUNTER — Encounter: Payer: Self-pay | Admitting: Neurology

## 2016-10-09 ENCOUNTER — Other Ambulatory Visit (INDEPENDENT_AMBULATORY_CARE_PROVIDER_SITE_OTHER): Payer: Medicare Other

## 2016-10-09 VITALS — BP 108/80 | HR 69 | Ht 68.0 in | Wt 226.2 lb

## 2016-10-09 DIAGNOSIS — G629 Polyneuropathy, unspecified: Secondary | ICD-10-CM | POA: Diagnosis not present

## 2016-10-09 DIAGNOSIS — M5416 Radiculopathy, lumbar region: Secondary | ICD-10-CM | POA: Diagnosis not present

## 2016-10-09 LAB — SEDIMENTATION RATE: Sed Rate: 29 mm/hr (ref 0–30)

## 2016-10-09 LAB — C-REACTIVE PROTEIN: CRP: 0.1 mg/dL — ABNORMAL LOW (ref 0.5–20.0)

## 2016-10-09 MED ORDER — NORTRIPTYLINE HCL 10 MG PO CAPS: ORAL_CAPSULE | ORAL | 5 refills | Status: DC

## 2016-10-09 NOTE — Progress Notes (Signed)
[facility] Clinic Note - Initial Visit   Date: 10/09/16  DANYIEL CRESPIN MRN: [id number] DOB: 06/08/1946   Dear Dr. Dwyane Dee:  Thank you for your kind referral of Kelli Martinez for consultation of neuropathy. Although her history is well known to you, please allow [place] to reiterate it for the purpose of our medical record. The patient was accompanied to the clinic by self.    History of Present Illness: Kelli Martinez is a 71 y.o. right-handed African American female with hypothyroidism, pulmonary embolism, hyperlipidemia, RLS, history of breast cancer, s/p gastric bypass (2011)  presenting for evaluation of neuropathy.    She has history of neuropathy affecting the feet for many years, but this has worsened over the past 5 years.  Symptoms started with numbness and tingling involving the feet and over time has involved her lower legs and hands.  She has sensitivity to light touch over the feet.  She takes gabapentin 630m 6 times daily, which provides some relief.  She does not have weakness and has not suffered any falls.  She stumbles at times and walks with a cane as needed.   Starting around 2017, she began having tingling sensation over the left hip and radiate down her leg.  [facility].  MRI lumbar spine from 2008 shows degenerative changes and facet disease at L4-5 and small disc protrusion at L5-S1.  She had not had any updated imaging or tried physical therapy.    She had gastric bypass in 2011 and has postprandial hypoglycemia.  She has history of diabetes, last HbA1c 6.5.  She does not drink alcohol and there is no family history of neuropathy.  Out-side paper records, electronic medical record, and images have been reviewed where available and summarized as:  Lab Results  Component Value Date   HGBA1C 6.5 08/01/2016   MRI lumbar spine wwo contrast 07/04/2007: 1. No evidence of  osseous or intradural metastatic disease.   2. Shallow right paracentral disc protrusion at T12-L1 without associated nerve root encroachment.   3. Disc bulge and moderate to severe facet disease at L4-5. No associated spinal stenosis or nerve root encroachment.   4. Lesser facet disease and small central disc protrusion at L5-S1. No associated spinal stenosis or nerve root encroachment.  Lab Results  Component Value Date   TSH 2.35 04/24/2016   Lab Results  Component Value Date   [id number] 84606/07/2014   Lab Results  Component Value Date   HGBA1C 6.5 08/01/2016     Past Medical History:  Diagnosis Date  . Allergy   . Anemia   . Arthritis   . Asthma   . Back pain, chronic    GETS INJECTIONS IN BACK  . Blind right eye    hemorrhage  . Blood transfusion   . Cancer ([place]    breast 1994  . Clotting disorder ([place]   . Diabetes mellitus    hypoglycemia since large wt loss  . Elevated liver enzymes   . Gallstones   . GERD (gastroesophageal reflux disease)   . HX: breast cancer   . Hyperlipidemia   . Hypertension   . Hypothyroid   . Lymphedema of arm    RT  . Morbid obesity ([place]   . OSA (obstructive sleep apnea)    uses c pap  . Osteoporosis   . Psoriasis   . Pulmonary embolism ([place] 1998 / 1994 /1968  .  Syncope and collapse 2011   due to anemia    Past Surgical History:  Procedure Laterality Date  . ABDOMINAL HYSTERECTOMY    . BONE MARROW TRANSPLANT  1994  . CARDIOVASCULAR STRESS TEST  05/23/2005   EF 53%  . carpal tunnell     bil  . cataracts    . CHOLECYSTECTOMY  05/10/2012   Procedure: LAPAROSCOPIC CHOLECYSTECTOMY WITH INTRAOPERATIVE CHOLANGIOGRAM;  Surgeon: Pedro Earls, MD;  Location: [facility];  Service: General;  Laterality: N/A;  Laparoscopic Cholecystectomy with Intraoperative Cholangiogram  . GASTRIC BYPASS  2011   bariatric surgery  . HIATAL HERNIA REPAIR    . IVC filter     recurrent DVT  . KNEE ARTHROTOMY  1998  . MASTECTOMY     Right   . US ECHOCARDIOGRAPHY  10/27/08   EF 55-60%     Medications:  Outpatient Encounter Prescriptions as of 10/09/2016  Medication Sig Note  . acarbose (PRECOSE) 25 MG tablet TAKE 1 TABLET THREE TIMES A DAY (Patient taking differently: 2 tablets am, 1 tablet at lunch and 1 tablet at dinner)   . albuterol (PROAIR HFA) 108 (90 Base) MCG/ACT inhaler USE 2 INHALATIONS FOUR TIMES A DAY AS NEEDED ( AS NEEDED RESCUE INHALER)   . atorvastatin (LIPITOR) 20 MG tablet TAKE 1 TABLET DAILY (Patient taking differently: Take 1/2 tablet every other day)   . baclofen (LIORESAL) 10 MG tablet Take 1 tablet 4 times daily   . Blood Glucose Monitoring Suppl (FREESTYLE FREEDOM LITE) W/DEVICE KIT use as directed   . Blood Glucose Monitoring Suppl (FREESTYLE LITE) DEVI Use to check blood sugar once a day DX code E11.65   . clobetasol cream (TEMOVATE) 3.76 % Apply 1 application topically 2 (two) times daily.   . ferrous sulfate 325 (65 FE) MG tablet Take 325 mg by mouth daily.     . fluticasone (FLONASE) 50 MCG/ACT nasal spray Place 2 sprays into both nostrils daily.   . Fluticasone-Salmeterol (ADVAIR DISKUS) 100-50 MCG/DOSE AEPB USE 1 INHALATION EVERY 12 HOURS, RINSE MOUTH   . FREESTYLE LITE test strip USE AS INSTRUCTED TO CHECK BLOOD SUGAR ONCE DAILY   . furosemide (LASIX) 40 MG tablet TAKE 1 TABLET EVERY MORNING (Patient taking differently: TAKE 1 TABLET EVERY OTHER MORNING)   . gabapentin (NEURONTIN) 300 MG capsule TAKE 1 CAPSULE FOUR TIMES A DAY (Patient taking differently: TAKE 2 CAPSULE THREE TIMES A DAY)   . HYDROcodone-acetaminophen (NORCO) 5-325 MG tablet Take 1 tablet by mouth 2 (two) times daily as needed for moderate pain.   . hydrOXYzine (ATARAX/VISTARIL) 25 MG tablet Take 1 tablet (25 mg total) by mouth 3 (three) times daily as needed.   . Lancets (FREESTYLE) lancets USE AS INSTRUCTED TO CHECK BLOOD SUGAR ONCE DAILY   . levothyroxine (SYNTHROID, LEVOTHROID) 125 MCG tablet TAKE 1 TABLET DAILY BEFORE BREAKFAST    . magnesium chloride (SLOW-MAG) 64 MG TBEC SR tablet Take 1 tablet (64 mg total) by mouth 2 (two) times daily.   . methylcellulose (ARTIFICIAL TEARS) 1 % ophthalmic solution Place 1 drop into both eyes as needed. Dry eyes   . mupirocin cream (BACTROBAN) 2 % Apply 1 application topically 2 (two) times daily.   . NON FORMULARY CPAP 6 [facility]   . omeprazole (PRILOSEC) 20 MG capsule TAKE 1 CAPSULE DAILY   . spironolactone (ALDACTONE) 50 MG tablet TAKE 1 TABLET TWICE A DAY   . temazepam (RESTORIL) 15 MG capsule 1-2 for sleep as needed   . triamcinolone  cream (KENALOG) 0.1 % Apply 1 application topically 2 (two) times daily.   . vitamin B-12 (CYANOCOBALAMIN) 500 MCG tablet Take 500 mcg by mouth daily.     . Vitamin D, Ergocalciferol, (DRISDOL) 50000 units CAPS capsule TAKE 1 CAPSULE EVERY FRIDAY   . warfarin (COUMADIN) 10 MG tablet One tablet Mon, Wed, Fri 04/27/2015: Pt getting 41m from mail order pharmacy at this time due to 10 mg tablets not aval.  . warfarin (COUMADIN) 5 MG tablet TAKE 2 TABLETS ONCE DAILY (THIS IS TO REPLACE THE 10 MG TABLET)   . [DISCONTINUED] azithromycin (ZITHROMAX) 250 MG tablet Take 2 today and then one daily   . [DISCONTINUED] predniSONE (DELTASONE) 10 MG tablet Take 4 tabs for 2 days, then 3 tabs for 2 days, then 2 tabs for 2 days, 1 tab for 2 days.    No facility-administered encounter medications on file as of 10/09/2016.      Allergies:  Allergies  Allergen Reactions  . Adhesive [Tape] Rash    Family History: Family History  Problem Relation Age of Onset  . Sudden death Mother     car accident  . Cancer Father [phone]   lung  . Cancer Sister 564   lung cancer  . Asthma Daughter     Social History: Social History  Substance Use Topics  . Smoking status: Former Smoker    Packs/day: 0.50    Years: 10.00    Types: Cigarettes    Quit date: 07/31/1978  . Smokeless tobacco: Never Used  . Alcohol use No   Social History   Social History Narrative    Widowed with 2 children,  Lives in a one story home and her son and his wife recently moved in with her.  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     Review of Systems:  CONSTITUTIONAL: No fevers, chills, night sweats, +weight loss.   EYES: No visual changes or eye pain ENT: No hearing changes.  No history of nose bleeds.   RESPIRATORY: No cough, wheezing and shortness of breath.   CARDIOVASCULAR: Negative for chest pain, and palpitations.   GI: Negative for abdominal discomfort, blood in stools or black stools.  No recent change in bowel habits.   GU:  No history of incontinence.   MUSCLOSKELETAL: No history of joint pain or swelling.  No myalgias.   SKIN: +lesions, rash, and itching.   HEMATOLOGY/ONCOLOGY: Negative for prolonged bleeding, bruising easily, and swollen nodes.  +history of cancer.   ENDOCRINE: Negative for cold or heat intolerance, polydipsia or goiter.   PSYCH:  No depression or anxiety symptoms.   NEURO: As Above.   Vital Signs:  BP 108/80   Pulse 69   Ht _0  (1.727 m)   Wt 226 lb 3 oz (102.6 kg)   SpO2 98%   BMI 34.39 kg/m    General Medical Exam:   General:  Well appearing, comfortable.   Eyes/ENT: see cranial nerve examination.   Neck: No masses appreciated.  Full range of motion without tenderness.  No carotid bruits. Respiratory:  Clear to auscultation, good air entry bilaterally.   Cardiac:  Regular rate and rhythm, no murmur.  Skin:  Radiation skin changes over the left neck with asymmetric of shoulder length.  Neurological Exam: MENTAL STATUS including orientation to time, place, person, recent and remote memory, attention span and concentration, language, and fund of knowledge is normal.  Speech is not dysarthric.  CRANIAL NERVES: II:  No  visual field defects.  Unremarkable fundi.   III-IV-VI: Pupils equal round and reactive to light.  Normal conjugate, extra-ocular eye movements in all directions of gaze.  No nystagmus.  No ptosis.     V:  Normal facial sensation   VII:  Normal facial symmetry and movements.   VIII:  Normal hearing and vestibular function.   IX-X:  Normal palatal movement.   XI:  Normal shoulder shrug and head rotation.   XII:  Normal tongue strength and range of motion, no deviation or fasciculation.  MOTOR:  No atrophy, fasciculations or abnormal movements.  No pronator drift.  Tone is normal.    Right Upper Extremity:    Left Upper Extremity:    Deltoid  5/5   Deltoid  5/5   Biceps  5/5   Biceps  5/5   Triceps  5/5   Triceps  5/5   Wrist extensors  5/5   Wrist extensors  5/5   Wrist flexors  5/5   Wrist flexors  5/5   Finger extensors  5/5   Finger extensors  5/5   Finger flexors  5/5   Finger flexors  5/5   Dorsal interossei  5/5   Dorsal interossei  5/5   Abductor pollicis  5/5   Abductor pollicis  5/5   Tone (Ashworth scale)  0  Tone (Ashworth scale)  0   Right Lower Extremity:    Left Lower Extremity:    Hip flexors  5-/5   Hip flexors  5-/5   Hip extensors  5-/5   Hip extensors  5-/5   Knee flexors  5-/5   Knee flexors  5-/5   Knee extensors  5-/5   Knee extensors  5-/5   Dorsiflexors  5-/5   Dorsiflexors  5-/5   Plantarflexors  5-/5   Plantarflexors  5-/5   Toe extensors  5-/5   Toe extensors  5-/5   Toe flexors  5-/5   Toe flexors  5-/5   Tone (Ashworth scale)  0  Tone (Ashworth scale)  0   MSRs:  Right                                                                 Left brachioradialis 2+  brachioradialis 2+  biceps 2+  biceps 2+  triceps 2+  triceps 2+  patellar trace  Patellar Trace  ankle jerk 0  ankle jerk 0  Hoffman no  Hoffman no  plantar response down  plantar response down   SENSORY:  Gradient pattern of sensory less distal to knees and hands bilaterally to pin prick, temperature, light touch, and vibration.  Romberg's sign present.   COORDINATION/GAIT: Normal finger-to- nose-finger.  Intact rapid alternating movements bilaterally.  Unble to rise from a chair without  using arms.  Gait wide-based due to body habitus.  She is unable to perform tandem gait.   IMPRESSION: 1.  Peripheral neuropathy following glove-stocking distribution.  Although history of diabetes and chemotherapy may be contributing to neuropathy, the fact that symptoms have progressed over the past few years, even when sugars are well controlled are atypical.  NCS/EMG was recommended to evaluate her symptoms, but she would like to hold on testing for now..  In the meantime, I will check  labs for secondary causes of neuropathy and add nortriptyline to her pain regimen.  2.  Lumbar radiculopathy causing descending pain and paresthesias of the legs.  She sees Dr. Nelva Bush for this.  PLAN/RECOMMENDATIONS:  1.  Check ESR, CRP, ANA, SSA/B, vitamin B12, vitamin B1, copper, folate 2.  Start physical therapy for low back pain and left radicular pain 3.  Start nortriptyline 23m at bedtime for 2 week, then increase to 2 tablet at bedtime 4.  Continue gabapentin 605m6 times daily  Return to clinic in 3 months.   The duration of this appointment visit was 45 minutes of face-to-face time with the patient.  Greater than 50% of this time was spent in counseling, explanation of diagnosis, planning of further management, and coordination of care.   Thank you for allowing me to participate in patient's care.  If I can answer any additional questions, I would be pleased to do so.    Sincerely,    Marquist Binstock K. PaPosey ProntoDO

## 2016-10-09 NOTE — Patient Instructions (Addendum)
1.  Check blood work 2.  Start nortriptyline 10mg  at bedtime for 2 week, then increase to 2 tablet at bedtime 3.  Continue gabapentin 600mg  6 times daily 4.  Start physical therapy for low back pain and left radicular pain  Return to clinic in 3 months

## 2016-10-10 LAB — VITAMIN B12: Vitamin B-12: 936 pg/mL — ABNORMAL HIGH (ref 211–911)

## 2016-10-10 LAB — SJOGREN'S SYNDROME ANTIBODS(SSA + SSB)
SSA (RO) (ENA) ANTIBODY, IGG: NEGATIVE
SSB (La) (ENA) Antibody, IgG: <1

## 2016-10-10 LAB — FOLATE: Folate: 12.6 ng/mL (ref >5.9)

## 2016-10-10 LAB — ANA: ANA: NEGATIVE

## 2016-10-11 LAB — COPPER, SERUM: Copper: 186 ug/dL — ABNORMAL HIGH (ref 70–175)

## 2016-10-13 ENCOUNTER — Other Ambulatory Visit: Payer: Self-pay | Admitting: *Deleted

## 2016-10-13 ENCOUNTER — Telehealth: Payer: Self-pay | Admitting: *Deleted

## 2016-10-13 DIAGNOSIS — R899 Unspecified abnormal finding in specimens from other organs, systems and tissues: Secondary | ICD-10-CM

## 2016-10-13 LAB — VITAMIN B1: Vitamin B1 (Thiamine): 10 nmol/L (ref 8–30)

## 2016-10-13 NOTE — Telephone Encounter (Signed)
-----   Message from Alda Berthold, DO sent at 10/13/2016  9:15 AM EDT ----- Please inform pt labs look good, except her copper is very mildly elevated.  Let's order ceruloplasmin to follow-up on this.  Thanks

## 2016-10-13 NOTE — Telephone Encounter (Signed)
Patient given results.  She will be coming in on March 30 to have labs drawn for Dr. Dwyane Dee so I will add the ceruloplasmin to the orders.

## 2016-10-14 ENCOUNTER — Other Ambulatory Visit: Payer: Self-pay | Admitting: Endocrinology

## 2016-10-17 ENCOUNTER — Other Ambulatory Visit: Payer: Self-pay | Admitting: Endocrinology

## 2016-10-20 DIAGNOSIS — L732 Hidradenitis suppurativa: Secondary | ICD-10-CM | POA: Diagnosis not present

## 2016-10-27 ENCOUNTER — Other Ambulatory Visit: Payer: Medicare Other

## 2016-10-30 ENCOUNTER — Ambulatory Visit: Payer: Medicare Other | Admitting: Endocrinology

## 2016-11-03 ENCOUNTER — Ambulatory Visit (INDEPENDENT_AMBULATORY_CARE_PROVIDER_SITE_OTHER): Payer: Medicare Other | Admitting: General Practice

## 2016-11-03 DIAGNOSIS — Z5181 Encounter for therapeutic drug level monitoring: Secondary | ICD-10-CM

## 2016-11-03 LAB — POCT INR: INR: 2.8

## 2016-11-03 NOTE — Patient Instructions (Signed)
Pre visit review using our clinic review tool, if applicable. No additional management support is needed unless otherwise documented below in the visit note. 

## 2016-11-03 NOTE — Progress Notes (Signed)
I have reviewed and agree with the plan. 

## 2016-11-12 ENCOUNTER — Other Ambulatory Visit: Payer: Self-pay | Admitting: Endocrinology

## 2016-11-17 ENCOUNTER — Other Ambulatory Visit: Payer: Self-pay | Admitting: Endocrinology

## 2016-11-18 ENCOUNTER — Other Ambulatory Visit: Payer: Self-pay | Admitting: Endocrinology

## 2016-11-20 ENCOUNTER — Encounter (HOSPITAL_BASED_OUTPATIENT_CLINIC_OR_DEPARTMENT_OTHER): Payer: Self-pay | Admitting: *Deleted

## 2016-11-22 ENCOUNTER — Telehealth: Payer: Self-pay | Admitting: Internal Medicine

## 2016-11-22 NOTE — Telephone Encounter (Signed)
Spoke with Janett Billow the pharmacist and she was looking for the ok to refill pt's temazepam. Informed her that the pt had this filled on 08/31/16 and it was printed and given to the pt will need to find out to find out where she had this filled. Spoke with pt and she stated that she got it filled at [facility] but no longer want her rx to go through them and [facility].   CY- please advise if ok to cancel pt's rx that is at [facility].    temazepam (RESTORIL) 15 MG capsule [[id number]]  Order Details  Dose, Route, Frequency: As Directed   Dispense Quantity:  30 capsule Refills:  5 Fills remaining:  --        Sig: 1-2 for sleep as needed       Written Date:  08/31/16

## 2016-11-23 ENCOUNTER — Encounter (HOSPITAL_BASED_OUTPATIENT_CLINIC_OR_DEPARTMENT_OTHER)
Admission: RE | Admit: 2016-11-23 | Discharge: 2016-11-23 | Disposition: A | Discharge status: Home / Self Care | Payer: Medicare Other | Source: Ambulatory Visit | Attending: Surgery | Admitting: Surgery

## 2016-11-23 ENCOUNTER — Ambulatory Visit: Payer: Self-pay | Admitting: Surgery

## 2016-11-23 DIAGNOSIS — Z87891 Personal history of nicotine dependence: Secondary | ICD-10-CM | POA: Diagnosis not present

## 2016-11-23 DIAGNOSIS — Z7901 Long term (current) use of anticoagulants: Secondary | ICD-10-CM | POA: Diagnosis not present

## 2016-11-23 DIAGNOSIS — Z7951 Long term (current) use of inhaled steroids: Secondary | ICD-10-CM | POA: Diagnosis not present

## 2016-11-23 DIAGNOSIS — M7989 Other specified soft tissue disorders: Secondary | ICD-10-CM | POA: Diagnosis not present

## 2016-11-23 DIAGNOSIS — D649 Anemia, unspecified: Secondary | ICD-10-CM | POA: Diagnosis not present

## 2016-11-23 DIAGNOSIS — M6148 Other calcification of muscle, other site: Secondary | ICD-10-CM | POA: Diagnosis not present

## 2016-11-23 DIAGNOSIS — G4730 Sleep apnea, unspecified: Secondary | ICD-10-CM | POA: Diagnosis not present

## 2016-11-23 DIAGNOSIS — K219 Gastro-esophageal reflux disease without esophagitis: Secondary | ICD-10-CM | POA: Diagnosis not present

## 2016-11-23 DIAGNOSIS — Z7984 Long term (current) use of oral hypoglycemic drugs: Secondary | ICD-10-CM | POA: Diagnosis not present

## 2016-11-23 DIAGNOSIS — Z9884 Bariatric surgery status: Secondary | ICD-10-CM | POA: Diagnosis not present

## 2016-11-23 DIAGNOSIS — Z6835 Body mass index (BMI) 35.0-35.9, adult: Secondary | ICD-10-CM | POA: Diagnosis not present

## 2016-11-23 DIAGNOSIS — L732 Hidradenitis suppurativa: Secondary | ICD-10-CM | POA: Diagnosis present

## 2016-11-23 DIAGNOSIS — E1142 Type 2 diabetes mellitus with diabetic polyneuropathy: Secondary | ICD-10-CM | POA: Diagnosis not present

## 2016-11-23 DIAGNOSIS — G4733 Obstructive sleep apnea (adult) (pediatric): Secondary | ICD-10-CM | POA: Diagnosis not present

## 2016-11-23 DIAGNOSIS — I10 Essential (primary) hypertension: Secondary | ICD-10-CM | POA: Diagnosis not present

## 2016-11-23 DIAGNOSIS — E039 Hypothyroidism, unspecified: Secondary | ICD-10-CM | POA: Diagnosis not present

## 2016-11-23 DIAGNOSIS — Z9011 Acquired absence of right breast and nipple: Secondary | ICD-10-CM | POA: Diagnosis not present

## 2016-11-23 DIAGNOSIS — Z86711 Personal history of pulmonary embolism: Secondary | ICD-10-CM | POA: Diagnosis not present

## 2016-11-23 DIAGNOSIS — Z923 Personal history of irradiation: Secondary | ICD-10-CM | POA: Diagnosis not present

## 2016-11-23 DIAGNOSIS — E785 Hyperlipidemia, unspecified: Secondary | ICD-10-CM | POA: Diagnosis not present

## 2016-11-23 DIAGNOSIS — Z79899 Other long term (current) drug therapy: Secondary | ICD-10-CM | POA: Diagnosis not present

## 2016-11-23 DIAGNOSIS — Z853 Personal history of malignant neoplasm of breast: Secondary | ICD-10-CM | POA: Diagnosis not present

## 2016-11-23 DIAGNOSIS — J4530 Mild persistent asthma, uncomplicated: Secondary | ICD-10-CM | POA: Diagnosis not present

## 2016-11-23 LAB — BASIC METABOLIC PANEL
ANION GAP: 5 (ref 5–15)
BUN: 16 mg/dL (ref 6–20)
CHLORIDE: 103 mmol/L (ref 101–111)
CO2: 27 mmol/L (ref 22–32)
Calcium: 9.2 mg/dL (ref 8.9–10.3)
Creatinine, Ser: 0.94 mg/dL (ref 0.44–1.00)
GFR calc Af Amer: >60 mL/min (ref >60)
GLUCOSE: 122 mg/dL — AB (ref 65–99)
POTASSIUM: 4.1 mmol/L (ref 3.5–5.1)
Sodium: 135 mmol/L (ref 135–145)

## 2016-11-23 LAB — PROTIME-INR
INR: 1.32
Prothrombin Time: 16.5 seconds — ABNORMAL HIGH (ref 11.4–15.2)

## 2016-11-23 MED ORDER — TEMAZEPAM 15 MG PO CAPS: ORAL_CAPSULE | ORAL | 1 refills | Status: DC

## 2016-11-23 NOTE — Progress Notes (Signed)
Labs drawn this morning/ PT result unable to obtain per Lab/ called home and cell phone and left messages for pt./ pt called back and will return to have lab redrawn.

## 2016-11-23 NOTE — H&P (Signed)
Chief Complaint:  History of breast cancer with axillary hidradenitis  History of Present Illness:  Kelli Martinez is an 71 y.o. female who has had axillary hidradenitis.  Concern for underlying malignancy and will try excision.  Her PT is still a little prolonged  Past Medical History:  Diagnosis Date  . Allergy   . Anemia   . Arthritis   . Asthma   . Back pain, chronic    GETS INJECTIONS IN BACK  . Blind right eye    hemorrhage  . Blood transfusion   . Cancer ([place])    breast 1994  . Clotting disorder ([place])   . Diabetes mellitus without complication ([place])    takes precose  . Elevated liver enzymes   . Gallstones   . GERD (gastroesophageal reflux disease)   . HX: breast cancer   . Hyperlipidemia   . Hypertension   . Hypothyroid   . Lymphedema of arm    RT  . Morbid obesity ([place])   . OSA (obstructive sleep apnea)    has not used in 2 years-lost weight  . Osteoporosis   . Peripheral neuropathy    on gabapentin  . Psoriasis   . Pulmonary embolism ([place]) 1998 / 1994 /1968  . Syncope and collapse 2011   due to anemia    Past Surgical History:  Procedure Laterality Date  . ABDOMINAL HYSTERECTOMY    . BONE MARROW TRANSPLANT  1994  . CARDIOVASCULAR STRESS TEST  05/23/2005   EF 53%  . carpal tunnell     bil  . cataracts    . CHOLECYSTECTOMY  05/10/2012   Procedure: LAPAROSCOPIC CHOLECYSTECTOMY WITH INTRAOPERATIVE CHOLANGIOGRAM;  Surgeon: Pedro Earls, MD;  Location: [facility];  Service: General;  Laterality: N/A;  Laparoscopic Cholecystectomy with Intraoperative Cholangiogram  . GASTRIC BYPASS  2011   bariatric surgery  . HIATAL HERNIA REPAIR    . IVC filter     recurrent DVT  . KNEE ARTHROTOMY  1998  . MASTECTOMY     Right  . US ECHOCARDIOGRAPHY  10/27/08   EF 55-60%    Current Outpatient Prescriptions  Medication Sig Dispense Refill  . acarbose (PRECOSE) 25 MG tablet TAKE 1 TABLET THREE TIMES A DAY (Patient taking differently: 2 tablets am, 1 tablet at  lunch and 1 tablet at dinner) 270 tablet 1  . albuterol (PROAIR HFA) 108 (90 Base) MCG/ACT inhaler USE 2 INHALATIONS FOUR TIMES A DAY AS NEEDED ( AS NEEDED RESCUE INHALER) 54 g 3  . atorvastatin (LIPITOR) 20 MG tablet TAKE 1 TABLET DAILY (Patient taking differently: Take 1/2 tablet every other day) 90 tablet 2  . baclofen (LIORESAL) 10 MG tablet Take 1 tablet 4 times daily 360 each 1  . Blood Glucose Monitoring Suppl (FREESTYLE FREEDOM LITE) W/DEVICE KIT use as directed 1 each 0  . Blood Glucose Monitoring Suppl (FREESTYLE LITE) DEVI Use to check blood sugar once a day DX code E11.65 1 each 0  . clobetasol cream (TEMOVATE) 1.19 % Apply 1 application topically 2 (two) times daily.    . ferrous sulfate 325 (65 FE) MG tablet Take 325 mg by mouth daily.      . fluticasone (FLONASE) 50 MCG/ACT nasal spray Place 2 sprays into both nostrils daily. 48 g 3  . Fluticasone-Salmeterol (ADVAIR DISKUS) 100-50 MCG/DOSE AEPB USE 1 INHALATION EVERY 12 HOURS, RINSE MOUTH 180 each 3  . FREESTYLE LITE test strip USE AS INSTRUCTED TO CHECK BLOOD SUGAR ONCE DAILY 100 each 1  .  furosemide (LASIX) 40 MG tablet TAKE 1 TABLET EVERY MORNING (Patient taking differently: TAKE 1 TABLET EVERY OTHER MORNING) 90 tablet 1  . gabapentin (NEURONTIN) 300 MG capsule TAKE 1 CAPSULE FOUR TIMES A DAY 360 capsule 1  . HYDROcodone-acetaminophen (NORCO) 5-325 MG tablet Take 1 tablet by mouth 2 (two) times daily as needed for moderate pain. 60 tablet 0  . hydrOXYzine (ATARAX/VISTARIL) 25 MG tablet Take 1 tablet (25 mg total) by mouth 3 (three) times daily as needed. 30 tablet 0  . Lancets (FREESTYLE) lancets USE AS INSTRUCTED TO CHECK BLOOD SUGAR ONCE DAILY 100 each 1  . levothyroxine (SYNTHROID, LEVOTHROID) 125 MCG tablet TAKE 1 TABLET DAILY BEFORE BREAKFAST 90 tablet 1  . magnesium chloride (SLOW-MAG) 64 MG TBEC SR tablet Take 1 tablet (64 mg total) by mouth 2 (two) times daily. 60 tablet 6  . methylcellulose (ARTIFICIAL TEARS) 1 %  ophthalmic solution Place 1 drop into both eyes as needed. Dry eyes    . NON FORMULARY CPAP 6 [facility]    . nortriptyline (PAMELOR) 10 MG capsule Start nortriptyline 68m at bedtime for 2 weeks, then increase to 2 tablet at bedtime 60 capsule 5  . omeprazole (PRILOSEC) 20 MG capsule TAKE 1 CAPSULE DAILY 90 capsule 0  . spironolactone (ALDACTONE) 50 MG tablet TAKE 1 TABLET TWICE A DAY 180 tablet 1  . temazepam (RESTORIL) 15 MG capsule 1-2 for sleep as needed 90 capsule 1  . triamcinolone cream (KENALOG) 0.1 % Apply 1 application topically 2 (two) times daily. 30 g 1  . vitamin B-12 (CYANOCOBALAMIN) 500 MCG tablet Take 500 mcg by mouth daily.      . Vitamin D, Ergocalciferol, (DRISDOL) 50000 units CAPS capsule TAKE 1 CAPSULE EVERY FRIDAY 12 capsule 1  . warfarin (COUMADIN) 10 MG tablet One tablet Mon, Wed, Fri    . warfarin (COUMADIN) 5 MG tablet TAKE 2 TABLETS ONCE DAILY (THIS IS TO REPLACE THE 10 MG TABLET) 180 tablet 1   No current facility-administered medications for this visit.    Adhesive [tape] Family History  Problem Relation Age of Onset  . Sudden death Mother     car accident  . Cancer Father 720   lung  . Cancer Sister 525   lung cancer  . Asthma Daughter    Social History:   reports that she quit smoking about 38 years ago. Her smoking use included Cigarettes. She has a 5.00 pack-year smoking history. She has never used smokeless tobacco. She reports that she does not drink alcohol or use drugs.   REVIEW OF SYSTEMS : Negative except for see problem list  Physical Exam:   There were no vitals taken for this visit. There is no height or weight on file to calculate BMI.  Gen:  WDWN AAF NAD  Right axillary hidradenitis  Heart SR LABORATORY RESULTS: No results found for this or any previous visit (from the past 48 hour(s)).   RADIOLOGY RESULTS: No results found.  Problem List: Patient Active Problem List   Diagnosis Date Noted  . Encounter for therapeutic drug  monitoring 08/26/2013  . Insomnia 05/01/2013  . Long term (current) use of anticoagulants 04/15/2013  . Postsurgical dumping syndrome 04/06/2013  . Bilateral leg edema 04/06/2013  . Reactive hypoglycemia 04/02/2013  . Abdominal pain intermittent-evalulating for internal hernia 11/07/2012  . S/P laparoscopic cholecystectomy 05/31/2012  . [facility]  . Unspecified deficiency anemia 11/14/2011  . Left leg paresthesias 10/27/2010  .  Hypothyroid 10/27/2010  . Low back pain 10/27/2010  . Obstructive sleep apnea 09/29/2007  . PULMONARY EMBOLISM 09/25/2007  . Seasonal and perennial allergic rhinitis 09/25/2007  . Allergic asthma, mild persistent, uncomplicated 12/87/8676  . BREAST CANCER, HX OF 09/25/2007    Assessment & Plan: Right axillary hidradenitis-for biopsy and excision    Matt B. Hassell Done, MD, [facility] [phone] beeper [phone]  11/23/2016 11:08 PM

## 2016-11-23 NOTE — Pre-Procedure Instructions (Signed)
PT/INR results called to Abigail Butts at [place].

## 2016-11-23 NOTE — Telephone Encounter (Signed)
[facility] and canceled rx. [facility] and gave verbal of rx per CY. Contacted pt, unknown female answered the phone and stated pt is in the shower. Informed him to let pt know we [facility] and she can call us if she needed anything further. Nothing further is needed

## 2016-11-23 NOTE — Telephone Encounter (Signed)
Ok to refill # 90 for 3 months, 1 at bedtime if needed for sleep ref x 1     Mail order

## 2016-11-24 ENCOUNTER — Ambulatory Visit (HOSPITAL_BASED_OUTPATIENT_CLINIC_OR_DEPARTMENT_OTHER)
Admission: RE | Admit: 2016-11-24 | Discharge: 2016-11-24 | Disposition: A | Discharge status: Home / Self Care | Payer: Medicare Other | Source: Ambulatory Visit | Attending: Surgery | Admitting: Surgery

## 2016-11-24 ENCOUNTER — Encounter (HOSPITAL_BASED_OUTPATIENT_CLINIC_OR_DEPARTMENT_OTHER): Payer: Self-pay | Admitting: *Deleted

## 2016-11-24 ENCOUNTER — Encounter (HOSPITAL_BASED_OUTPATIENT_CLINIC_OR_DEPARTMENT_OTHER)
Admission: RE | Disposition: A | Discharge status: Home / Self Care | Payer: Self-pay | Source: Ambulatory Visit | Attending: Surgery

## 2016-11-24 ENCOUNTER — Ambulatory Visit (HOSPITAL_BASED_OUTPATIENT_CLINIC_OR_DEPARTMENT_OTHER): Payer: Medicare Other | Admitting: Anesthesiology

## 2016-11-24 DIAGNOSIS — M7989 Other specified soft tissue disorders: Secondary | ICD-10-CM | POA: Diagnosis not present

## 2016-11-24 DIAGNOSIS — G4730 Sleep apnea, unspecified: Secondary | ICD-10-CM | POA: Diagnosis not present

## 2016-11-24 DIAGNOSIS — E039 Hypothyroidism, unspecified: Secondary | ICD-10-CM | POA: Diagnosis not present

## 2016-11-24 DIAGNOSIS — Z7901 Long term (current) use of anticoagulants: Secondary | ICD-10-CM | POA: Insufficient documentation

## 2016-11-24 DIAGNOSIS — J4530 Mild persistent asthma, uncomplicated: Secondary | ICD-10-CM | POA: Diagnosis not present

## 2016-11-24 DIAGNOSIS — E785 Hyperlipidemia, unspecified: Secondary | ICD-10-CM | POA: Insufficient documentation

## 2016-11-24 DIAGNOSIS — Z6835 Body mass index (BMI) 35.0-35.9, adult: Secondary | ICD-10-CM | POA: Insufficient documentation

## 2016-11-24 DIAGNOSIS — E1142 Type 2 diabetes mellitus with diabetic polyneuropathy: Secondary | ICD-10-CM | POA: Insufficient documentation

## 2016-11-24 DIAGNOSIS — G4733 Obstructive sleep apnea (adult) (pediatric): Secondary | ICD-10-CM | POA: Insufficient documentation

## 2016-11-24 DIAGNOSIS — Z9884 Bariatric surgery status: Secondary | ICD-10-CM | POA: Insufficient documentation

## 2016-11-24 DIAGNOSIS — Z9011 Acquired absence of right breast and nipple: Secondary | ICD-10-CM | POA: Insufficient documentation

## 2016-11-24 DIAGNOSIS — D649 Anemia, unspecified: Secondary | ICD-10-CM | POA: Insufficient documentation

## 2016-11-24 DIAGNOSIS — Z79899 Other long term (current) drug therapy: Secondary | ICD-10-CM | POA: Insufficient documentation

## 2016-11-24 DIAGNOSIS — Z87891 Personal history of nicotine dependence: Secondary | ICD-10-CM | POA: Insufficient documentation

## 2016-11-24 DIAGNOSIS — I10 Essential (primary) hypertension: Secondary | ICD-10-CM | POA: Insufficient documentation

## 2016-11-24 DIAGNOSIS — L988 Other specified disorders of the skin and subcutaneous tissue: Secondary | ICD-10-CM | POA: Diagnosis not present

## 2016-11-24 DIAGNOSIS — M545 Low back pain: Secondary | ICD-10-CM | POA: Diagnosis not present

## 2016-11-24 DIAGNOSIS — Z923 Personal history of irradiation: Secondary | ICD-10-CM | POA: Insufficient documentation

## 2016-11-24 DIAGNOSIS — K219 Gastro-esophageal reflux disease without esophagitis: Secondary | ICD-10-CM | POA: Diagnosis not present

## 2016-11-24 DIAGNOSIS — Z86711 Personal history of pulmonary embolism: Secondary | ICD-10-CM | POA: Insufficient documentation

## 2016-11-24 DIAGNOSIS — Z7984 Long term (current) use of oral hypoglycemic drugs: Secondary | ICD-10-CM | POA: Insufficient documentation

## 2016-11-24 DIAGNOSIS — M6148 Other calcification of muscle, other site: Secondary | ICD-10-CM | POA: Insufficient documentation

## 2016-11-24 DIAGNOSIS — Z853 Personal history of malignant neoplasm of breast: Secondary | ICD-10-CM | POA: Insufficient documentation

## 2016-11-24 DIAGNOSIS — Z7951 Long term (current) use of inhaled steroids: Secondary | ICD-10-CM | POA: Insufficient documentation

## 2016-11-24 DIAGNOSIS — L989 Disorder of the skin and subcutaneous tissue, unspecified: Secondary | ICD-10-CM | POA: Diagnosis not present

## 2016-11-24 HISTORY — PX: MASS EXCISION: SHX2000

## 2016-11-24 HISTORY — DX: Type 2 diabetes mellitus without complications: E11.9

## 2016-11-24 HISTORY — DX: Other specified postprocedural states: Z98.890

## 2016-11-24 HISTORY — DX: Polyneuropathy, unspecified: G62.9

## 2016-11-24 HISTORY — DX: Nausea with vomiting, unspecified: R11.2

## 2016-11-24 LAB — GLUCOSE, CAPILLARY
GLUCOSE-CAPILLARY: 116 mg/dL — AB (ref 65–99)
Glucose-Capillary: 97 mg/dL (ref 65–99)

## 2016-11-24 SURGERY — EXCISION MASS
Anesthesia: General | Site: Axilla | Laterality: Right

## 2016-11-24 MED ORDER — KETOROLAC TROMETHAMINE 30 MG/ML IJ SOLN: 30.0000 mg | Freq: Once | INTRAMUSCULAR | Status: DC | PRN

## 2016-11-24 MED ORDER — FENTANYL CITRATE (PF) 100 MCG/2ML IJ SOLN: 50.0000 ug | INTRAMUSCULAR | Status: DC | PRN

## 2016-11-24 MED ORDER — CEFAZOLIN SODIUM-DEXTROSE 2-4 GM/100ML-% IV SOLN
2.0000 g | INTRAVENOUS | Status: AC
  Administered 2016-11-24: 2 g via INTRAVENOUS

## 2016-11-24 MED ORDER — PROPOFOL 10 MG/ML IV BOLUS
INTRAVENOUS | Status: DC | PRN
  Administered 2016-11-24: 150 mg via INTRAVENOUS

## 2016-11-24 MED ORDER — SCOPOLAMINE 1 MG/3DAYS TD PT72
MEDICATED_PATCH | TRANSDERMAL | Status: DC | PRN
  Administered 2016-11-24: 1 via TRANSDERMAL

## 2016-11-24 MED ORDER — HEPARIN SODIUM (PORCINE) 5000 UNIT/ML IJ SOLN
INTRAMUSCULAR | Status: AC
  Filled 2016-11-24: qty 1

## 2016-11-24 MED ORDER — CHLORHEXIDINE GLUCONATE CLOTH 2 % EX PADS: 6.0000 | MEDICATED_PAD | Freq: Once | CUTANEOUS | Status: DC

## 2016-11-24 MED ORDER — PROPOFOL 10 MG/ML IV BOLUS
INTRAVENOUS | Status: AC
  Filled 2016-11-24: qty 20

## 2016-11-24 MED ORDER — MEPERIDINE HCL 25 MG/ML IJ SOLN: 6.2500 mg | INTRAMUSCULAR | Status: DC | PRN

## 2016-11-24 MED ORDER — ACETAMINOPHEN 160 MG/5ML PO SOLN: 325.0000 mg | ORAL | Status: DC | PRN

## 2016-11-24 MED ORDER — LACTATED RINGERS IV SOLN
INTRAVENOUS | Status: DC
  Administered 2016-11-24 (×2): via INTRAVENOUS

## 2016-11-24 MED ORDER — FENTANYL CITRATE (PF) 100 MCG/2ML IJ SOLN
INTRAMUSCULAR | Status: DC | PRN
  Administered 2016-11-24 (×2): 50 ug via INTRAVENOUS

## 2016-11-24 MED ORDER — BACITRACIN-NEOMYCIN-POLYMYXIN 400-5-5000 EX OINT
TOPICAL_OINTMENT | CUTANEOUS | Status: AC
  Filled 2016-11-24: qty 1

## 2016-11-24 MED ORDER — ACETAMINOPHEN 325 MG PO TABS
325.0000 mg | ORAL_TABLET | ORAL | Status: DC | PRN
Start: 2016-11-24 — End: 2016-11-24

## 2016-11-24 MED ORDER — MIDAZOLAM HCL 2 MG/2ML IJ SOLN: 1.0000 mg | INTRAMUSCULAR | Status: DC | PRN

## 2016-11-24 MED ORDER — FENTANYL CITRATE (PF) 100 MCG/2ML IJ SOLN: 25.0000 ug | INTRAMUSCULAR | Status: DC | PRN

## 2016-11-24 MED ORDER — 0.9 % SODIUM CHLORIDE (POUR BTL) OPTIME
TOPICAL | Status: DC | PRN
  Administered 2016-11-24: 1000 mL

## 2016-11-24 MED ORDER — OXYCODONE HCL 5 MG PO TABS: 5.0000 mg | ORAL_TABLET | Freq: Once | ORAL | Status: DC | PRN

## 2016-11-24 MED ORDER — MIDAZOLAM HCL 5 MG/5ML IJ SOLN
INTRAMUSCULAR | Status: DC | PRN
  Administered 2016-11-24: 2 mg via INTRAVENOUS

## 2016-11-24 MED ORDER — HYDROCODONE-ACETAMINOPHEN 5-325 MG PO TABS: 1.0000 | ORAL_TABLET | ORAL | 0 refills | Status: DC | PRN

## 2016-11-24 MED ORDER — CEFAZOLIN SODIUM-DEXTROSE 2-4 GM/100ML-% IV SOLN
INTRAVENOUS | Status: AC
  Filled 2016-11-24: qty 100

## 2016-11-24 MED ORDER — ONDANSETRON HCL 4 MG/2ML IJ SOLN: 4.0000 mg | Freq: Once | INTRAMUSCULAR | Status: DC | PRN

## 2016-11-24 MED ORDER — BUPIVACAINE HCL 0.5 % IJ SOLN
INTRAMUSCULAR | Status: DC | PRN
  Administered 2016-11-24: 10 mL

## 2016-11-24 MED ORDER — FENTANYL CITRATE (PF) 100 MCG/2ML IJ SOLN
INTRAMUSCULAR | Status: AC
  Filled 2016-11-24: qty 2

## 2016-11-24 MED ORDER — MIDAZOLAM HCL 2 MG/2ML IJ SOLN
INTRAMUSCULAR | Status: AC
  Filled 2016-11-24: qty 2

## 2016-11-24 MED ORDER — HEPARIN SODIUM (PORCINE) 5000 UNIT/ML IJ SOLN
5000.0000 [IU] | Freq: Once | INTRAMUSCULAR | Status: AC
  Administered 2016-11-24: 5000 [IU] via SUBCUTANEOUS

## 2016-11-24 MED ORDER — ONDANSETRON HCL 4 MG/2ML IJ SOLN
INTRAMUSCULAR | Status: DC | PRN
  Administered 2016-11-24: 4 mg via INTRAVENOUS

## 2016-11-24 MED ORDER — SCOPOLAMINE 1 MG/3DAYS TD PT72: 1.0000 | MEDICATED_PATCH | Freq: Once | TRANSDERMAL | Status: DC | PRN

## 2016-11-24 MED ORDER — LIDOCAINE HCL (CARDIAC) 20 MG/ML IV SOLN
INTRAVENOUS | Status: DC | PRN
  Administered 2016-11-24: 5 mL via INTRATRACHEAL

## 2016-11-24 MED ORDER — OXYCODONE HCL 5 MG/5ML PO SOLN: 5.0000 mg | Freq: Once | ORAL | Status: DC | PRN

## 2016-11-24 SURGICAL SUPPLY — 53 items
BINDER BREAST XLRG (GAUZE/BANDAGES/DRESSINGS) ×9 IMPLANT
BLADE SURG 15 STRL LF DISP TIS (BLADE) ×3 IMPLANT
BLADE SURG 15 STRL SS (BLADE) ×8
CANISTER SUCT 1200ML W/VALVE (MISCELLANEOUS) ×4 IMPLANT
CLEANER CAUTERY TIP 5X5 PAD (MISCELLANEOUS) IMPLANT
CLOSURE WOUND 1/2 X4 (GAUZE/BANDAGES/DRESSINGS)
COVER BACK TABLE 60X90IN (DRAPES) ×4 IMPLANT
COVER MAYO STAND STRL (DRAPES) ×4 IMPLANT
DRAPE LAPAROSCOPIC ABDOMINAL (DRAPES) ×3 IMPLANT
DRAPE LAPAROTOMY 100X72 PEDS (DRAPES) ×4 IMPLANT
DRSG PAD ABDOMINAL 8X10 ST (GAUZE/BANDAGES/DRESSINGS) ×6 IMPLANT
DRSG TELFA 3X8 NADH (GAUZE/BANDAGES/DRESSINGS) ×8 IMPLANT
ELECT COATED BLADE 2.86 ST (ELECTRODE) ×4 IMPLANT
ELECT REM PT RETURN 9FT ADLT (ELECTROSURGICAL) ×4
ELECTRODE REM PT RTRN 9FT ADLT (ELECTROSURGICAL) ×2 IMPLANT
GAUZE SPONGE 4X4 12PLY STRL (GAUZE/BANDAGES/DRESSINGS) ×3 IMPLANT
GAUZE SPONGE 4X4 12PLY STRL LF (GAUZE/BANDAGES/DRESSINGS) ×4 IMPLANT
GAUZE SPONGE 4X4 16PLY XRAY LF (GAUZE/BANDAGES/DRESSINGS) IMPLANT
GLOVE BIO SURGEON STRL SZ7 (GLOVE) ×3 IMPLANT
GLOVE BIO SURGEON STRL SZ8 (GLOVE) ×4 IMPLANT
GLOVE BIOGEL PI IND STRL 7.0 (GLOVE) ×1 IMPLANT
GLOVE BIOGEL PI IND STRL 7.5 (GLOVE) ×1 IMPLANT
GLOVE BIOGEL PI INDICATOR 7.0 (GLOVE) ×2
GLOVE BIOGEL PI INDICATOR 7.5 (GLOVE) ×2
GLOVE SURG SS PI 7.0 STRL IVOR (GLOVE) ×3 IMPLANT
GOWN STRL REUS W/ TWL LRG LVL3 (GOWN DISPOSABLE) ×3 IMPLANT
GOWN STRL REUS W/ TWL XL LVL3 (GOWN DISPOSABLE) ×3 IMPLANT
GOWN STRL REUS W/TWL LRG LVL3 (GOWN DISPOSABLE) ×8
GOWN STRL REUS W/TWL XL LVL3 (GOWN DISPOSABLE) ×8
NDL HYPO 25X1 1.5 SAFETY (NEEDLE) IMPLANT
NEEDLE HYPO 25X1 1.5 SAFETY (NEEDLE) IMPLANT
PACK BASIN DAY SURGERY FS (CUSTOM PROCEDURE TRAY) ×4 IMPLANT
PAD CLEANER CAUTERY TIP 5X5 (MISCELLANEOUS)
PAD DRESSING TELFA 3X8 NADH (GAUZE/BANDAGES/DRESSINGS) IMPLANT
PENCIL BUTTON HOLSTER BLD 10FT (ELECTRODE) ×4 IMPLANT
SHEET MEDIUM DRAPE 40X70 STRL (DRAPES) ×1 IMPLANT
SLEEVE SCD COMPRESS KNEE MED (MISCELLANEOUS) ×3 IMPLANT
STRIP CLOSURE SKIN 1/2X4 (GAUZE/BANDAGES/DRESSINGS) IMPLANT
SUT ETHILON 4 0 PS 2 18 (SUTURE) ×12 IMPLANT
SUT PROLENE 4 0 PS 2 18 (SUTURE) IMPLANT
SUT VIC AB 4-0 SH 18 (SUTURE) ×3 IMPLANT
SUT VIC AB 4-0 SH 27 (SUTURE)
SUT VIC AB 4-0 SH 27XANBCTRL (SUTURE) ×1 IMPLANT
SUT VIC AB 5-0 P-3 18X BRD (SUTURE) ×1 IMPLANT
SUT VIC AB 5-0 P3 18 (SUTURE)
SYR BULB 3OZ (MISCELLANEOUS) ×4 IMPLANT
SYR CONTROL 10ML LL (SYRINGE) ×3 IMPLANT
TAPE CLOTH SURG 4X10 WHT LF (GAUZE/BANDAGES/DRESSINGS) ×3 IMPLANT
TOWEL OR 17X24 6PK STRL BLUE (TOWEL DISPOSABLE) ×4 IMPLANT
TRAY DSU PREP LF (CUSTOM PROCEDURE TRAY) ×4 IMPLANT
TUBE CONNECTING 20'X1/4 (TUBING) ×1
TUBE CONNECTING 20X1/4 (TUBING) ×3 IMPLANT
YANKAUER SUCT BULB TIP NO VENT (SUCTIONS) ×4 IMPLANT

## 2016-11-24 NOTE — Op Note (Signed)
Surgeon: Kaylyn Lim, MD, FACS  Asst:  none  Anes:  general  Preop Dx: Hidradenitis of the axilla Postop Dx: Calcified possibly dormant cancer in irradiated tissue  Procedure: Excision of three areas in prior right mastectomy/RT site-axilla, superomedial and inferomedial Location Surgery: CDS Complications: none  EBL:   minimal cc  Drains: none  Description of Procedure:  The patient was taken to OR 8 .  After anesthesia was administered and the patient was prepped a timeout was performed.  The area in the axilla was excised as an ellipse.  In the base was a hard calcified nodule that was excised in pieces.  The tissue was approximated with 4-0 nylon.  The two areas at the medial extent of the  Right mastectomy scar.  These were excised and similarly closed with nylon skin sutures.  Neosporin and a breast binder were applied.  The patient tolerated the procedure well and was taken to the PACU in stable condition.     Kelli Martinez Done, [place], [facility], [place]

## 2016-11-24 NOTE — Discharge Instructions (Signed)
°  Post Anesthesia Home Care Instructions  Activity: Get plenty of rest for the remainder of the day. A responsible individual must stay with you for 24 hours following the procedure.  For the next 24 hours, DO NOT: -Drive a car -Paediatric nurse -Drink alcoholic beverages -Take any medication unless instructed by your physician -Make any legal decisions or sign important papers.  Meals: Start with liquid foods such as gelatin or soup. Progress to regular foods as tolerated. Avoid greasy, spicy, heavy foods. If nausea and/or vomiting occur, drink only clear liquids until the nausea and/or vomiting subsides. Call your physician if vomiting continues.  Special Instructions/Symptoms: Your throat may feel dry or sore from the anesthesia or the breathing tube placed in your throat during surgery. If this causes discomfort, gargle with warm salt water. The discomfort should disappear within 24 hours.  If you had a scopolamine patch placed behind your ear for the management of post- operative nausea and/or vomiting:  1. The medication in the patch is effective for 72 hours, after which it should be removed.  Wrap patch in a tissue and discard in the trash. Wash hands thoroughly with soap and water. 2. You may remove the patch earlier than 72 hours if you experience unpleasant side effects which may include dry mouth, dizziness or visual disturbances. 3. Avoid touching the patch. Wash your hands with soap and water after contact with the patch.       May shower tomorrow.  Apply neosporin ointment to the biosy sites daily.

## 2016-11-24 NOTE — Anesthesia Postprocedure Evaluation (Signed)
Anesthesia Post Note  Patient: Kelli Martinez  Procedure(s) Performed: Procedure(s) (LRB): EXCISION RIGHT AXILLARY LESION AND CHEST WALL LESION (Right)  Patient location during evaluation: PACU Anesthesia Type: General Level of consciousness: awake and alert Pain management: pain level controlled Vital Signs Assessment: post-procedure vital signs reviewed and stable Respiratory status: spontaneous breathing, nonlabored ventilation, respiratory function stable and patient connected to nasal cannula oxygen Cardiovascular status: blood pressure returned to baseline and stable Postop Assessment: no signs of nausea or vomiting Anesthetic complications: no       Last Vitals:  Vitals:   11/24/16 1130 11/24/16 1230  BP: (!) 143/79 (!) 125/93  Pulse: 73 74  Resp: 13 16  Temp:  36.4 C    Last Pain:  Vitals:   11/24/16 0838  TempSrc: Oral                 Catalina Gravel

## 2016-11-24 NOTE — Interval H&P Note (Signed)
History and Physical Interval Note:  11/24/2016 9:36 AM  Kelli Martinez  has presented today for surgery, with the diagnosis of History of breast cancer  The various methods of treatment have been discussed with the patient and family. After consideration of risks, benefits and other options for treatment, the patient has consented to  Procedure(s): EXCISION RIGHT AXILLARY HIDRADENITIS (Right) as a surgical intervention .  The patient's history has been reviewed, patient examined, no change in status, stable for surgery.  I have reviewed the patient's chart and labs.  Questions were answered to the patient's satisfaction.     Dyana Magner B

## 2016-11-24 NOTE — Interval H&P Note (Signed)
History and Physical Interval Note:  11/24/2016 8:56 AM  Kelli Martinez  has presented today for surgery, with the diagnosis of History of breast cancer  The various methods of treatment have been discussed with the patient and family. After consideration of risks, benefits and other options for treatment, the patient has consented to  Procedure(s): EXCISION RIGHT AXILLARY HIDRADENITIS (Right) as a surgical intervention .  The patient's history has been reviewed, patient examined, no change in status, stable for surgery.  I have reviewed the patient's chart and labs.  Questions were answered to the patient's satisfaction.     Judene Logue B

## 2016-11-24 NOTE — Anesthesia Procedure Notes (Signed)
Procedure Name: LMA Insertion Date/Time: 11/24/2016 9:45 AM Performed by: Kalana Yust, Sheron Nightingale Pre-anesthesia Checklist: Patient identified, Timeout performed, Emergency Drugs available, Suction available and Patient being monitored Patient Re-evaluated:Patient Re-evaluated prior to inductionOxygen Delivery Method: Circle system utilized Preoxygenation: Pre-oxygenation with 100% oxygen Intubation Type: IV induction LMA: LMA inserted LMA Size: 4.0 Number of attempts: 1 Dental Injury: Teeth and Oropharynx as per pre-operative assessment

## 2016-11-24 NOTE — Anesthesia Preprocedure Evaluation (Signed)
Anesthesia Evaluation  Patient identified by MRN, date of birth, ID band Patient awake    Reviewed: Allergy & Precautions, H&P , NPO status , Patient's Chart, lab work & pertinent test results  History of Anesthesia Complications (+) PONV and history of anesthetic complications  Airway Mallampati: II  TM Distance: >3 FB Neck ROM: Full    Dental  (+) Poor Dentition   Pulmonary asthma , sleep apnea , former smoker, PE (s/p IVC filter placement)   Pulmonary exam normal breath sounds clear to auscultation       Cardiovascular hypertension, Pt. on medications negative cardio ROS Normal cardiovascular exam Rhythm:Regular Rate:Normal     Neuro/Psych negative psych ROS   GI/Hepatic Neg liver ROS, GERD  Medicated and Controlled,  Endo/Other  diabetes, Oral Hypoglycemic AgentsHypothyroidism   Renal/GU negative Renal ROS  negative genitourinary   Musculoskeletal   Abdominal (+) + obese,   Peds negative pediatric ROS (+)  Hematology   Anesthesia Other Findings   Reproductive/Obstetrics negative OB ROS                             Anesthesia Physical  Anesthesia Plan  ASA: III  Anesthesia Plan: General   Post-op Pain Management:    Induction: Intravenous  Airway Management Planned: LMA  Additional Equipment:   Intra-op Plan:   Post-operative Plan:   Informed Consent: I have reviewed the patients History and Physical, chart, labs and discussed the procedure including the risks, benefits and alternatives for the proposed anesthesia with the patient or authorized representative who has indicated his/her understanding and acceptance.   Dental advisory given  Plan Discussed with: CRNA and Surgeon  Anesthesia Plan Comments:         Anesthesia Quick Evaluation

## 2016-11-24 NOTE — Anesthesia Procedure Notes (Signed)
Performed by: Giovanne Nickolson M       

## 2016-11-24 NOTE — H&P (View-Only) (Signed)
Chief Complaint:  History of breast cancer with axillary hidradenitis  History of Present Illness:  Kelli Martinez is an 71 y.o. female who has had axillary hidradenitis.  Concern for underlying malignancy and will try excision.  Her PT is still a little prolonged  Past Medical History:  Diagnosis Date  . Allergy   . Anemia   . Arthritis   . Asthma   . Back pain, chronic    GETS INJECTIONS IN BACK  . Blind right eye    hemorrhage  . Blood transfusion   . Cancer (HCC)    breast 1994  . Clotting disorder (HCC)   . Diabetes mellitus without complication (HCC)    takes precose  . Elevated liver enzymes   . Gallstones   . GERD (gastroesophageal reflux disease)   . HX: breast cancer   . Hyperlipidemia   . Hypertension   . Hypothyroid   . Lymphedema of arm    RT  . Morbid obesity (HCC)   . OSA (obstructive sleep apnea)    has not used in 2 years-lost weight  . Osteoporosis   . Peripheral neuropathy    on gabapentin  . Psoriasis   . Pulmonary embolism (HCC) 1998 / 1994 /1968  . Syncope and collapse 2011   due to anemia    Past Surgical History:  Procedure Laterality Date  . ABDOMINAL HYSTERECTOMY    . BONE MARROW TRANSPLANT  1994  . CARDIOVASCULAR STRESS TEST  05/23/2005   EF 53%  . carpal tunnell     bil  . cataracts    . CHOLECYSTECTOMY  05/10/2012   Procedure: LAPAROSCOPIC CHOLECYSTECTOMY WITH INTRAOPERATIVE CHOLANGIOGRAM;  Surgeon: Bryley Kovacevic B Earl Losee, MD;  Location: [facility];  Service: General;  Laterality: N/A;  Laparoscopic Cholecystectomy with Intraoperative Cholangiogram  . GASTRIC BYPASS  2011   bariatric surgery  . HIATAL HERNIA REPAIR    . IVC filter     recurrent DVT  . KNEE ARTHROTOMY  1998  . MASTECTOMY     Right  . US ECHOCARDIOGRAPHY  10/27/08   EF 55-60%    Current Outpatient Prescriptions  Medication Sig Dispense Refill  . acarbose (PRECOSE) 25 MG tablet TAKE 1 TABLET THREE TIMES A DAY (Patient taking differently: 2 tablets am, 1 tablet at  lunch and 1 tablet at dinner) 270 tablet 1  . albuterol (PROAIR HFA) 108 (90 Base) MCG/ACT inhaler USE 2 INHALATIONS FOUR TIMES A DAY AS NEEDED ( AS NEEDED RESCUE INHALER) 54 g 3  . atorvastatin (LIPITOR) 20 MG tablet TAKE 1 TABLET DAILY (Patient taking differently: Take 1/2 tablet every other day) 90 tablet 2  . baclofen (LIORESAL) 10 MG tablet Take 1 tablet 4 times daily 360 each 1  . Blood Glucose Monitoring Suppl (FREESTYLE FREEDOM LITE) W/DEVICE KIT use as directed 1 each 0  . Blood Glucose Monitoring Suppl (FREESTYLE LITE) DEVI Use to check blood sugar once a day DX code E11.65 1 each 0  . clobetasol cream (TEMOVATE) 0.05 % Apply 1 application topically 2 (two) times daily.    . ferrous sulfate 325 (65 FE) MG tablet Take 325 mg by mouth daily.      . fluticasone (FLONASE) 50 MCG/ACT nasal spray Place 2 sprays into both nostrils daily. 48 g 3  . Fluticasone-Salmeterol (ADVAIR DISKUS) 100-50 MCG/DOSE AEPB USE 1 INHALATION EVERY 12 HOURS, RINSE MOUTH 180 each 3  . FREESTYLE LITE test strip USE AS INSTRUCTED TO CHECK BLOOD SUGAR ONCE DAILY 100 each 1  .   furosemide (LASIX) 40 MG tablet TAKE 1 TABLET EVERY MORNING (Patient taking differently: TAKE 1 TABLET EVERY OTHER MORNING) 90 tablet 1  . gabapentin (NEURONTIN) 300 MG capsule TAKE 1 CAPSULE FOUR TIMES A DAY 360 capsule 1  . HYDROcodone-acetaminophen (NORCO) 5-325 MG tablet Take 1 tablet by mouth 2 (two) times daily as needed for moderate pain. 60 tablet 0  . hydrOXYzine (ATARAX/VISTARIL) 25 MG tablet Take 1 tablet (25 mg total) by mouth 3 (three) times daily as needed. 30 tablet 0  . Lancets (FREESTYLE) lancets USE AS INSTRUCTED TO CHECK BLOOD SUGAR ONCE DAILY 100 each 1  . levothyroxine (SYNTHROID, LEVOTHROID) 125 MCG tablet TAKE 1 TABLET DAILY BEFORE BREAKFAST 90 tablet 1  . magnesium chloride (SLOW-MAG) 64 MG TBEC SR tablet Take 1 tablet (64 mg total) by mouth 2 (two) times daily. 60 tablet 6  . methylcellulose (ARTIFICIAL TEARS) 1 %  ophthalmic solution Place 1 drop into both eyes as needed. Dry eyes    . NON FORMULARY CPAP 6 [employer]    . nortriptyline (PAMELOR) 10 MG capsule Start nortriptyline 10mg at bedtime for 2 weeks, then increase to 2 tablet at bedtime 60 capsule 5  . omeprazole (PRILOSEC) 20 MG capsule TAKE 1 CAPSULE DAILY 90 capsule 0  . spironolactone (ALDACTONE) 50 MG tablet TAKE 1 TABLET TWICE A DAY 180 tablet 1  . temazepam (RESTORIL) 15 MG capsule 1-2 for sleep as needed 90 capsule 1  . triamcinolone cream (KENALOG) 0.1 % Apply 1 application topically 2 (two) times daily. 30 g 1  . vitamin B-12 (CYANOCOBALAMIN) 500 MCG tablet Take 500 mcg by mouth daily.      . Vitamin D, Ergocalciferol, (DRISDOL) 50000 units CAPS capsule TAKE 1 CAPSULE EVERY FRIDAY 12 capsule 1  . warfarin (COUMADIN) 10 MG tablet One tablet Mon, Wed, Fri    . warfarin (COUMADIN) 5 MG tablet TAKE 2 TABLETS ONCE DAILY (THIS IS TO REPLACE THE 10 MG TABLET) 180 tablet 1   No current facility-administered medications for this visit.    Adhesive [tape] Family History  Problem Relation Age of Onset  . Sudden death Mother     car accident  . Cancer Father 70    lung  . Cancer Sister 50    lung cancer  . Asthma Daughter    Social History:   reports that she quit smoking about 38 years ago. Her smoking use included Cigarettes. She has a 5.00 pack-year smoking history. She has never used smokeless tobacco. She reports that she does not drink alcohol or use drugs.   REVIEW OF SYSTEMS : Negative except for see problem list  Physical Exam:   There were no vitals taken for this visit. There is no height or weight on file to calculate BMI.  Gen:  [facility] AAF NAD  Right axillary hidradenitis  Heart SR LABORATORY RESULTS: No results found for this or any previous visit (from the past 48 hour(s)).   RADIOLOGY RESULTS: No results found.  Problem List: Patient Active Problem List   Diagnosis Date Noted  . Encounter for therapeutic drug  monitoring 08/26/2013  . Insomnia 05/01/2013  . Long term (current) use of anticoagulants 04/15/2013  . Postsurgical dumping syndrome 04/06/2013  . Bilateral leg edema 04/06/2013  . Reactive hypoglycemia 04/02/2013  . Abdominal pain intermittent-evalulating for internal hernia 11/07/2012  . S/P laparoscopic cholecystectomy 05/31/2012  . Roux Y Gastric Bypass April 2011; repair HH 11/22/2011  . Unspecified deficiency anemia 11/14/2011  . Left leg paresthesias 10/27/2010  .   Hypothyroid 10/27/2010  . Low back pain 10/27/2010  . Obstructive sleep apnea 09/29/2007  . PULMONARY EMBOLISM 09/25/2007  . Seasonal and perennial allergic rhinitis 09/25/2007  . Allergic asthma, mild persistent, uncomplicated 09/25/2007  . BREAST CANCER, HX OF 09/25/2007    Assessment & Plan: Right axillary hidradenitis-for biopsy and excision    Matt B. Grover Woodfield, MD, FACS  [facility] [phone] beeper [phone]  11/23/2016 11:08 PM     

## 2016-11-24 NOTE — Transfer of Care (Signed)
Immediate Anesthesia Transfer of Care Note  Patient: Kelli Martinez  Procedure(s) Performed: Procedure(s): EXCISION RIGHT AXILLARY LESION AND CHEST WALL LESION (Right)  Patient Location: PACU  Anesthesia Type:General  Level of Consciousness: awake, alert  and oriented  Airway & Oxygen Therapy: Patient Spontanous Breathing and Patient connected to nasal cannula oxygen  Post-op Assessment: Report given to RN and Post -op Vital signs reviewed and stable  Post vital signs: Reviewed and stable  Last Vitals:  Vitals:   11/24/16 0838 11/24/16 1045  BP: 111/82 (!) 147/73  Pulse: 72 83  Resp: 18 15  Temp: 36.7 C     Last Pain:  Vitals:   11/24/16 0838  TempSrc: Oral         Complications: No apparent anesthesia complications

## 2016-11-27 ENCOUNTER — Encounter (HOSPITAL_BASED_OUTPATIENT_CLINIC_OR_DEPARTMENT_OTHER): Payer: Self-pay | Admitting: Surgery

## 2016-11-27 ENCOUNTER — Other Ambulatory Visit (INDEPENDENT_AMBULATORY_CARE_PROVIDER_SITE_OTHER): Payer: Medicare Other

## 2016-11-27 DIAGNOSIS — E1142 Type 2 diabetes mellitus with diabetic polyneuropathy: Secondary | ICD-10-CM

## 2016-11-27 DIAGNOSIS — E063 Autoimmune thyroiditis: Secondary | ICD-10-CM | POA: Diagnosis not present

## 2016-11-27 LAB — COMPREHENSIVE METABOLIC PANEL WITH GFR
ALT: 31 U/L (ref 0–35)
AST: 27 U/L (ref 0–37)
Albumin: 4 g/dL (ref 3.5–5.2)
Alkaline Phosphatase: 93 U/L (ref 39–117)
BUN: 22 mg/dL (ref 6–23)
CO2: 34 meq/L — ABNORMAL HIGH (ref 19–32)
Calcium: 10.1 mg/dL (ref 8.4–10.5)
Chloride: 97 meq/L (ref 96–112)
Creatinine, Ser: 1.02 mg/dL (ref 0.40–1.20)
GFR: 69.11 mL/min
Glucose, Bld: 123 mg/dL — ABNORMAL HIGH (ref 70–99)
Potassium: 4.6 meq/L (ref 3.5–5.1)
Sodium: 135 meq/L (ref 135–145)
Total Bilirubin: 0.2 mg/dL (ref 0.2–1.2)
Total Protein: 7.2 g/dL (ref 6.0–8.3)

## 2016-11-27 LAB — HEMOGLOBIN A1C: Hgb A1c MFr Bld: 6.8 % (ref 4.6–6.5)

## 2016-11-27 LAB — TSH: TSH: 1.24 u[IU]/mL (ref 0.35–4.50)

## 2016-11-28 DIAGNOSIS — L603 Nail dystrophy: Secondary | ICD-10-CM | POA: Diagnosis not present

## 2016-11-28 DIAGNOSIS — E1151 Type 2 diabetes mellitus with diabetic peripheral angiopathy without gangrene: Secondary | ICD-10-CM | POA: Diagnosis not present

## 2016-11-28 DIAGNOSIS — I739 Peripheral vascular disease, unspecified: Secondary | ICD-10-CM | POA: Diagnosis not present

## 2016-11-28 DIAGNOSIS — L84 Corns and callosities: Secondary | ICD-10-CM | POA: Diagnosis not present

## 2016-11-30 ENCOUNTER — Ambulatory Visit (INDEPENDENT_AMBULATORY_CARE_PROVIDER_SITE_OTHER): Payer: Medicare Other | Admitting: Endocrinology

## 2016-11-30 ENCOUNTER — Encounter: Payer: Self-pay | Admitting: Endocrinology

## 2016-11-30 VITALS — BP 107/76 | HR 78 | Ht 68.0 in | Wt 233.4 lb

## 2016-11-30 DIAGNOSIS — E161 Other hypoglycemia: Secondary | ICD-10-CM

## 2016-11-30 NOTE — Progress Notes (Signed)
Patient ID: Kelli Martinez, female   DOB: [birth year], 71 y.o.   MRN: [id number]    Chief complaint: Followup of various issues   History of Present Illness:  The patient is seen for the following problems:  1. Postprandial hypoglycemia related to dumping syndrome   She has had postprandial hypoglycemia starting a couple of years after her gastric bypass surgery Her symptoms of blood sugars are mostly a feeling of significant weakness, some shakiness.  She will use fruits or juice in order to relieve the symptoms She has been to the dietitian and has been instructed on balanced low-fat meals with enough protein consistently and restricting carbohydrates including fruits   Recent history:  She has been treated with acarbose with improvement. She did try verapamil empirically but this did not help.  She is taking her Precose usually right before eating, 2 tablets at breakfast 1 at lunch and 1 at dinner  For breakfast she will have  yogurt with or without eggs in the morning However more recently at lunchtime she is eating a full sandwich and fruit with only some thin slices of ham     Recently she appears to be having more frequent hypoglycemia after lunch between about 2 PM-5 PM with significant symptoms She believes she has had occasional mild episodes during the night when she will eat an apple to leave her symptoms No low sugars documented to be low overnight or after supper Her fasting glucose was 123 in the lab but she thinks she had coffee before this   Wt Readings from Last 3 Encounters:  11/30/16 233 lb 6.4 oz (105.9 kg)  11/24/16 231 lb 3.2 oz (104.9 kg)  10/09/16 226 lb 3 oz (102.6 kg)    Mean values apply above for all meters except median for One Touch  PRE-MEAL Fasting Lunch Dinner Bedtime Overall  Glucose range: 85  121      Mean/median:     84    POST-MEAL PC Breakfast PC Lunch PC Dinner  Glucose range:   44-1 24  93, 126   Mean/median:         2.  History of diabetes:   Overall blood sugars have been well controlled since her bariatric surgery. Usually has a normal A1c readings around 6.2-6.6 But recently 6.8 Not checking postprandial readings much and her highest reading recently is 127  Currently on Precose as above  Recently has tended to gain weight unable to exercise because of her back pain  Meal portion size is usually well controlled and not having too many snacks   Lab Results  Component Value Date   HGBA1C 6.8 11/27/2016   HGBA1C 6.5 08/01/2016   HGBA1C 6.5 04/24/2016   Lab Results  Component Value Date   MICROALBUR <0.7 08/01/2016   LDLCALC 68 08/01/2016   CREATININE 1.02 11/27/2016      Other active problems: see review of systems      Allergies as of 11/30/2016      Reactions   Adhesive [tape] Rash      Medication List       Accurate as of 11/30/16  3:06 PM. Always use your most recent med list.          acarbose 25 MG tablet Commonly known as:  PRECOSE TAKE 1 TABLET THREE TIMES A DAY   albuterol 108 (90 Base) MCG/ACT inhaler Commonly known as:  PROAIR HFA USE 2 INHALATIONS FOUR TIMES A DAY AS NEEDED ( AS  NEEDED RESCUE INHALER)   atorvastatin 20 MG tablet Commonly known as:  LIPITOR TAKE 1 TABLET DAILY   baclofen 10 MG tablet Commonly known as:  LIORESAL Take 1 tablet 4 times daily   clobetasol cream 0.05 % Commonly known as:  TEMOVATE Apply 1 application topically 2 (two) times daily.   ferrous sulfate 325 (65 FE) MG tablet Take 325 mg by mouth daily.   fluticasone 50 MCG/ACT nasal spray Commonly known as:  FLONASE Place 2 sprays into both nostrils daily.   Fluticasone-Salmeterol 100-50 MCG/DOSE Aepb Commonly known as:  ADVAIR DISKUS USE 1 INHALATION EVERY 12 HOURS, RINSE MOUTH   freestyle lancets USE AS INSTRUCTED TO CHECK BLOOD SUGAR ONCE DAILY   FREESTYLE LITE Devi Use to check blood sugar once a day DX code E11.65   FREESTYLE FREEDOM LITE w/Device  Kit use as directed   FREESTYLE LITE test strip Generic drug:  glucose blood USE AS INSTRUCTED TO CHECK BLOOD SUGAR ONCE DAILY   furosemide 40 MG tablet Commonly known as:  LASIX TAKE 1 TABLET EVERY MORNING   gabapentin 300 MG capsule Commonly known as:  NEURONTIN TAKE 1 CAPSULE FOUR TIMES A DAY   HYDROcodone-acetaminophen 5-325 MG tablet Commonly known as:  NORCO Take 1 tablet by mouth every 4 (four) hours as needed for moderate pain.   hydrOXYzine 25 MG tablet Commonly known as:  ATARAX/VISTARIL Take 1 tablet (25 mg total) by mouth 3 (three) times daily as needed.   levothyroxine 125 MCG tablet Commonly known as:  SYNTHROID, LEVOTHROID TAKE 1 TABLET DAILY BEFORE BREAKFAST   magnesium chloride 64 MG Tbec SR tablet Commonly known as:  SLOW-MAG Take 1 tablet (64 mg total) by mouth 2 (two) times daily.   methylcellulose 1 % ophthalmic solution Commonly known as:  ARTIFICIAL TEARS Place 1 drop into both eyes as needed. Dry eyes   NON FORMULARY CPAP 6 [employer]   nortriptyline 10 MG capsule Commonly known as:  PAMELOR Start nortriptyline '10mg'$  at bedtime for 2 weeks, then increase to 2 tablet at bedtime   omeprazole 20 MG capsule Commonly known as:  PRILOSEC TAKE 1 CAPSULE DAILY   spironolactone 50 MG tablet Commonly known as:  ALDACTONE TAKE 1 TABLET TWICE A DAY   temazepam 15 MG capsule Commonly known as:  RESTORIL 1-2 for sleep as needed   triamcinolone cream 0.1 % Commonly known as:  KENALOG Apply 1 application topically 2 (two) times daily.   vitamin B-12 500 MCG tablet Commonly known as:  CYANOCOBALAMIN Take 500 mcg by mouth daily.   Vitamin D (Ergocalciferol) 50000 units Caps capsule Commonly known as:  DRISDOL TAKE 1 CAPSULE EVERY FRIDAY   warfarin 10 MG tablet Commonly known as:  COUMADIN One tablet Mon, Wed, Fri   warfarin 5 MG tablet Commonly known as:  COUMADIN TAKE 2 TABLETS ONCE DAILY (THIS IS TO REPLACE THE 10 MG TABLET)        Allergies:  Allergies  Allergen Reactions  . Adhesive [Tape] Rash    Past Medical History:  Diagnosis Date  . Allergy   . Anemia   . Arthritis   . Asthma   . Back pain, chronic    GETS INJECTIONS IN BACK  . Blind right eye    hemorrhage  . Blood transfusion   . Cancer ([place])    breast 1994  . Clotting disorder ([place])   . Diabetes mellitus without complication ([place])    takes precose  . Elevated liver enzymes   .  Gallstones   . GERD (gastroesophageal reflux disease)   . HX: breast cancer   . Hyperlipidemia   . Hypertension   . Hypothyroid   . Lymphedema of arm    RT  . Morbid obesity ([place])   . OSA (obstructive sleep apnea)    has not used in 2 years-lost weight  . Osteoporosis   . Peripheral neuropathy    on gabapentin  . PONV (postoperative nausea and vomiting)   . Psoriasis   . Pulmonary embolism ([place]) 1998 / 1994 /1968  . Syncope and collapse 2011   due to anemia    Past Surgical History:  Procedure Laterality Date  . ABDOMINAL HYSTERECTOMY    . BONE MARROW TRANSPLANT  1994  . CARDIOVASCULAR STRESS TEST  05/23/2005   EF 53%  . carpal tunnell     bil  . cataracts    . CHOLECYSTECTOMY  05/10/2012   Procedure: LAPAROSCOPIC CHOLECYSTECTOMY WITH INTRAOPERATIVE CHOLANGIOGRAM;  Surgeon: Pedro Earls, MD;  Location: [facility];  Service: General;  Laterality: N/A;  Laparoscopic Cholecystectomy with Intraoperative Cholangiogram  . GASTRIC BYPASS  2011   bariatric surgery  . HIATAL HERNIA REPAIR    . IVC filter     recurrent DVT  . KNEE ARTHROTOMY  1998  . MASS EXCISION Right 11/24/2016   Procedure: EXCISION RIGHT AXILLARY LESION AND CHEST WALL LESION;  Surgeon: Johnathan Hausen, MD;  Location: [facility];  Service: General;  Laterality: Right;  . MASTECTOMY     Right  . US ECHOCARDIOGRAPHY  10/27/08   EF 55-60%    Family History  Problem Relation Age of Onset  . Sudden death Mother     car accident  . Cancer Father 53    lung  . Cancer  Sister 9    lung cancer  . Asthma Daughter     Social History:  reports that she quit smoking about 38 years ago. Her smoking use included Cigarettes. She has a 5.00 pack-year smoking history. She has never used smokeless tobacco. She reports that she does not drink alcohol or use drugs.  Review of Systems   Neuropathy with burning and lower leg pain: Evaluated by neurologist and started on nortriptyline which helps at night but not during the day  Found to have minimally increased copper level Follow-up in June   EDEMA: She has had problems with  edema both in her legs and right arm Has been on 50 mg bid of Aldactone   She has taken Lasix about every day other recently Still complaining of edema but mostly on the left She has not been using the compression stockings as prescribed because of cost   Lymphedema right arm, secondary to  breast surgery, using  elastic compression stocking, needs another one   History of mild hypothyroidism: Adequately replaced  with 62.5 mcg ,  has consistently normal TSH   Lab Results  Component Value Date   TSH 1.24 11/27/2016    Hypercholesterolemia: LDL has been at target with Lipitor 10 mg Taking this also for cardiovascular prophylaxis Recent levels:  Lab Results  Component Value Date   CHOL 164 08/01/2016   HDL 86.80 08/01/2016   LDLCALC 68 08/01/2016   TRIG 47.0 08/01/2016   CHOLHDL 2 08/01/2016    Previous history of hypertension: Currently not on any medications except diuretics.    BP Readings from Last 3 Encounters:  11/30/16 107/76  11/24/16 (!) 125/93  10/09/16 108/80  LABS:  Lab on 11/27/2016  Component Date Value Ref Range Status  . Hgb A1c MFr Bld 11/27/2016 6.8  4.6 - 6.5 % Final  . Sodium 11/27/2016 135  135 - 145 mEq/L Final  . Potassium 11/27/2016 4.6  3.5 - 5.1 mEq/L Final  . Chloride 11/27/2016 97  96 - 112 mEq/L Final  . CO2 11/27/2016 34* 19 - 32 mEq/L Final  . Glucose, Bld 11/27/2016 123* 70 -  99 mg/dL Final  . BUN 11/27/2016 22  6 - 23 mg/dL Final  . Creatinine, Ser 11/27/2016 1.02  0.40 - 1.20 mg/dL Final  . Total Bilirubin 11/27/2016 0.2  0.2 - 1.2 mg/dL Final  . Alkaline Phosphatase 11/27/2016 93  39 - 117 U/L Final  . AST 11/27/2016 27  0 - 37 U/L Final  . ALT 11/27/2016 31  0 - 35 U/L Final  . Total Protein 11/27/2016 7.2  6.0 - 8.3 g/dL Final  . Albumin 11/27/2016 4.0  3.5 - 5.2 g/dL Final  . Calcium 11/27/2016 10.1  8.4 - 10.5 mg/dL Final  . GFR 11/27/2016 69.11  >60.00 mL/min Final  . TSH 11/27/2016 1.24  0.35 - 4.50 uIU/mL Final  Admission on 11/24/2016, Discharged on 11/24/2016  Component Date Value Ref Range Status  . Sodium 11/23/2016 135  135 - 145 mmol/L Final  . Potassium 11/23/2016 4.1  3.5 - 5.1 mmol/L Final  . Chloride 11/23/2016 103  101 - 111 mmol/L Final  . CO2 11/23/2016 27  22 - 32 mmol/L Final  . Glucose, Bld 11/23/2016 122* 65 - 99 mg/dL Final  . BUN 11/23/2016 16  6 - 20 mg/dL Final  . Creatinine, Ser 11/23/2016 0.94  0.44 - 1.00 mg/dL Final  . Calcium 11/23/2016 9.2  8.9 - 10.3 mg/dL Final  . GFR calc non Af Amer 11/23/2016 >60  >60 mL/min Final  . GFR calc Af Amer 11/23/2016 >60  >60 mL/min Final   Comment: (NOTE) The eGFR has been calculated using the CKD EPI equation. This calculation has not been validated in all clinical situations. eGFR's persistently <60 mL/min signify possible Chronic Kidney Disease.   . Anion gap 11/23/2016 5  5 - 15 Final  . Prothrombin Time 11/23/2016 16.5* 11.4 - 15.2 seconds Final  . INR 11/23/2016 1.32   Final  . Glucose-Capillary 11/24/2016 97  65 - 99 mg/dL Final  . Comment 1 11/24/2016 Document in Chart   Final  . Glucose-Capillary 11/24/2016 116* 65 - 99 mg/dL Final  Anti-coag visit on 11/03/2016  Component Date Value Ref Range Status  . INR 11/03/2016 2.8   Final     EXAM:  BP 107/76   Pulse 78   Ht _0  (1.727 m)   Wt 233 lb 6.4 oz (105.9 kg)   SpO2 98%   BMI 35.49 kg/m    Mild swelling  of the left lower leg with some stasis pigmentation  Assessment/Plan:   1. Reactive/postprandial hypoglycemia from dumping syndrome.  She has more frequent symptoms after lunch as discussed above and this is related to her increasing carbohydrate intake with inadequate protein Discussed changing her diet with increasing protein and reducing carbohydrate at lunch and also consistently at all meals May need to take Precose 50 mg at lunch also which is working well for breakfast currently   2.  NEUROPATHY: She can ask her neurologist if she can try taking nortriptyline 10 mg twice a day instead of 20 mg at night   3. Mild diabetes:  A1c Is high normal but blood sugars are mostly normal, needs to check more fasting readings Continue Precose  4.  Obesity with recent weight gain, currently not exercising and not able to lose weight despite usually eating small portions, may do better with reducing carbohydrate as above  5.  EDEMA of the legs: This is likely to be from venous insufficiency Will  keep her on 100 mg of Aldactone and Lasix every other day She will try to get elastic stockings on the left leg when she can afford them   6.  Mild hypothyroidism: Well controlled   Caira Poche 11/30/2016, 3:06 PM

## 2016-11-30 NOTE — Patient Instructions (Signed)
Cut back bread abd fruit at lunch, may need 2 Acarbose at lunch

## 2016-12-01 ENCOUNTER — Ambulatory Visit (INDEPENDENT_AMBULATORY_CARE_PROVIDER_SITE_OTHER): Payer: Medicare Other | Admitting: General Practice

## 2016-12-01 ENCOUNTER — Other Ambulatory Visit: Payer: Self-pay | Admitting: Endocrinology

## 2016-12-01 DIAGNOSIS — Z5181 Encounter for therapeutic drug level monitoring: Secondary | ICD-10-CM | POA: Diagnosis not present

## 2016-12-01 LAB — POCT INR: INR: 1.4

## 2016-12-01 NOTE — Patient Instructions (Signed)
Pre visit review using our clinic review tool, if applicable. No additional management support is needed unless otherwise documented below in the visit note. 

## 2016-12-01 NOTE — Progress Notes (Signed)
I have reviewed and agree with the plan. 

## 2016-12-13 IMAGING — CT CT HEAD W/O CM
4 series · 19 of 47 positions shown, 21 images · non-contrast
Comparison: Brain CT 08/03/2006.

CLINICAL DATA: Patient with posterior left-sided headache and
dizziness. Syncopal episode. Fall.

EXAM:
CT HEAD WITHOUT CONTRAST
TECHNIQUE: Contiguous axial images were obtained from the base of the skull
through the vertex without intravenous contrast.

[Series 201: head w/o, idose (1) · axial · non-contrast · 0.39mm/px · z∈[+103,+208]mm · 8 of 29 slices shown, 10 images]
[im 4/29  brain]
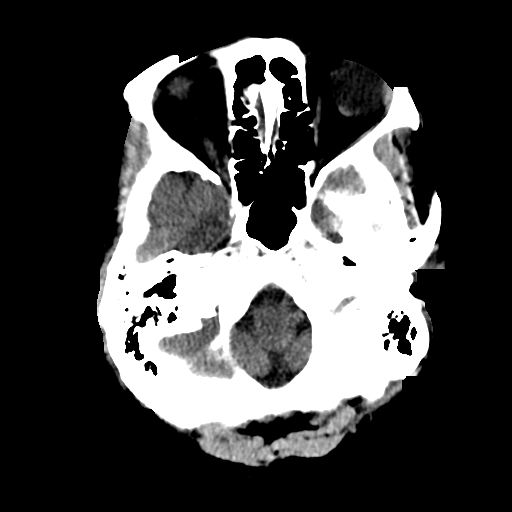
[im 4/29  bone]
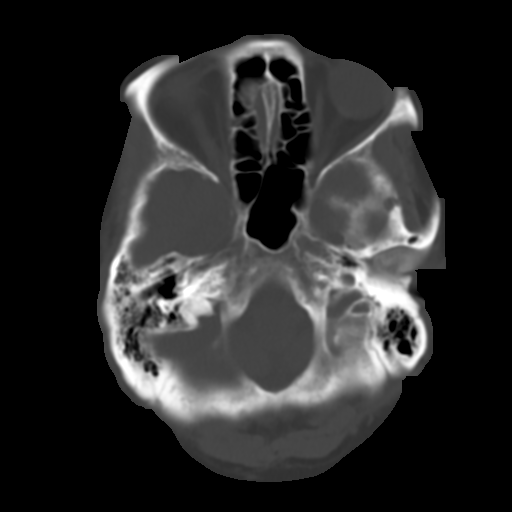
[im 7/29  brain]
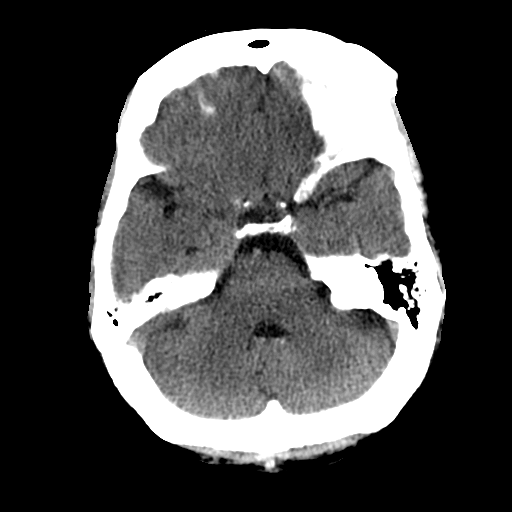
[im 10/29  brain]
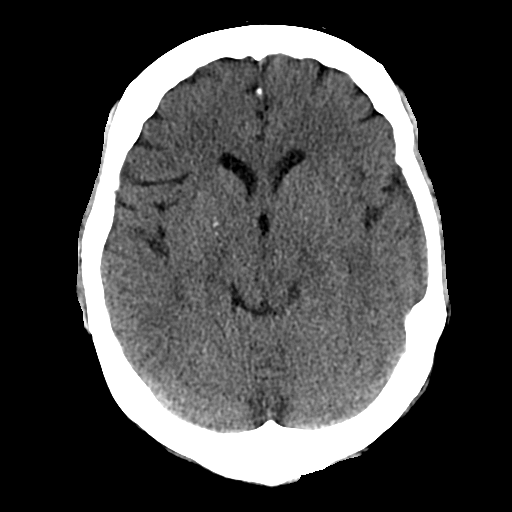
[im 13/29  brain]
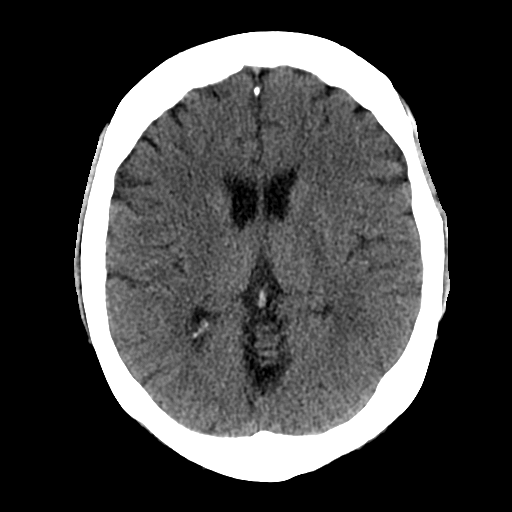
[im 16/29  brain]
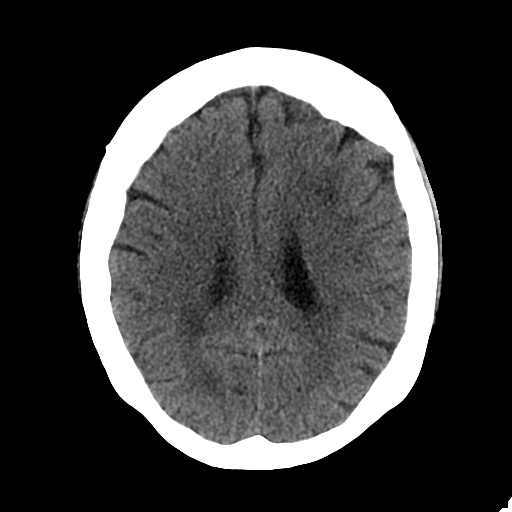
[im 16/29  bone]
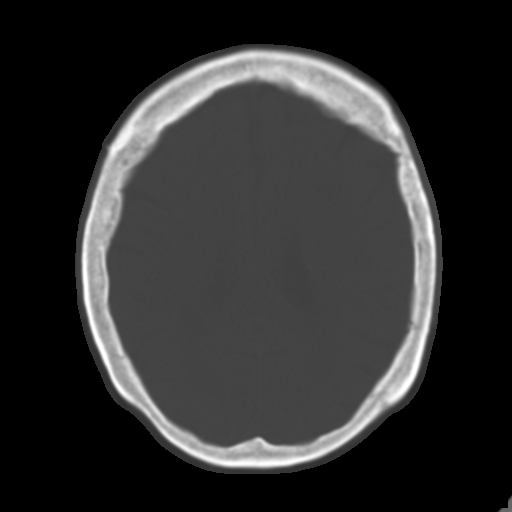
[im 19/29  brain]
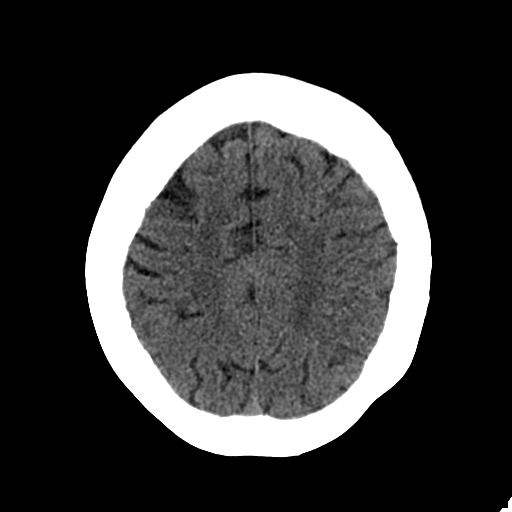
[im 22/29  brain]
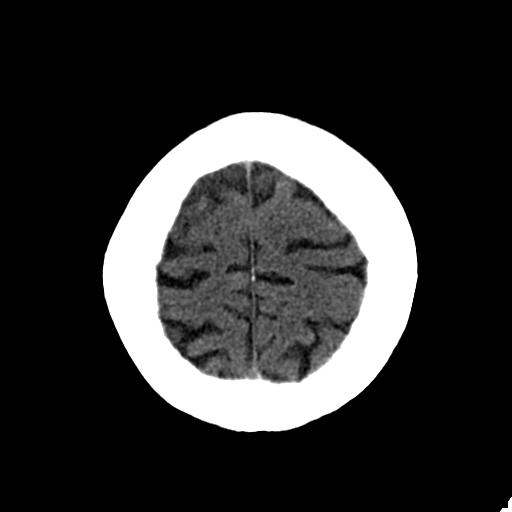
[im 25/29  brain]
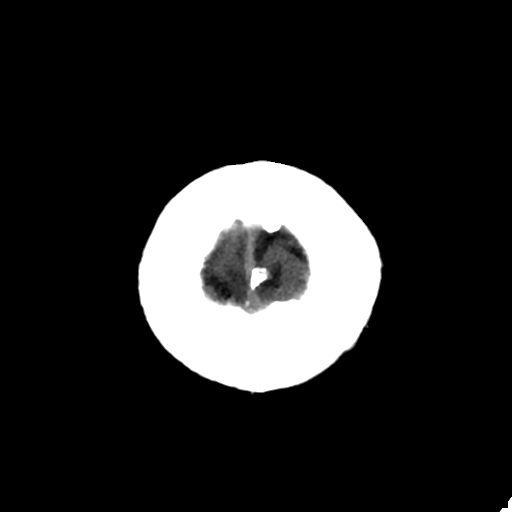

[Series 202: head w/o bone, idose (1) · axial · non-contrast · 0.39mm/px · z∈[+102,+169]mm · 5 of 58 slices shown]
[im 7/58  bone]
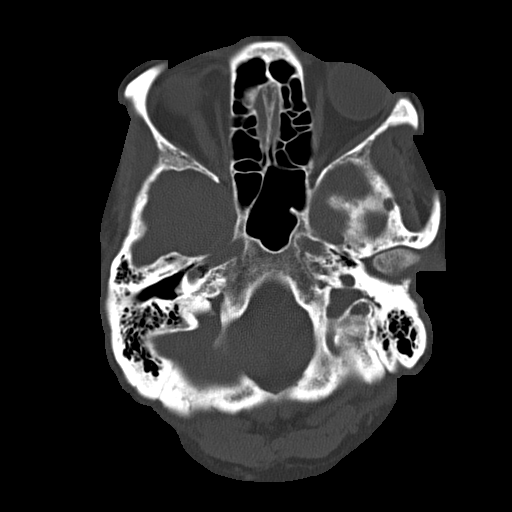
[im 13/58  bone]
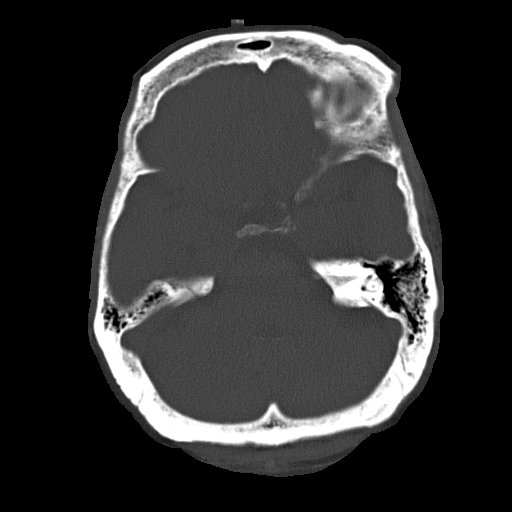
[im 19/58  bone]
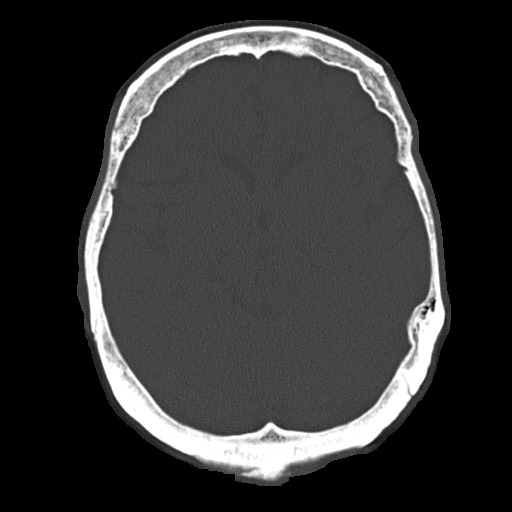
[im 25/58  bone]
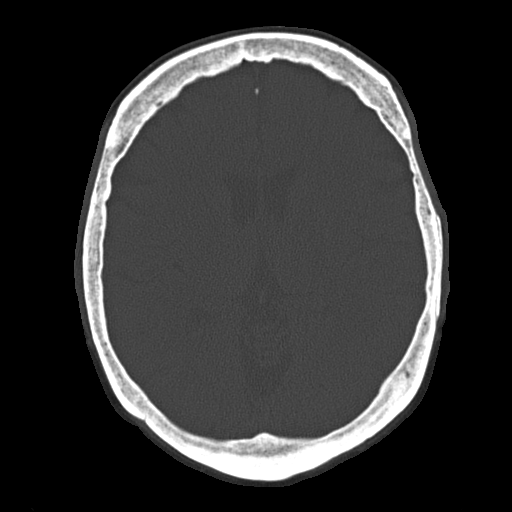
[im 34/58  bone]
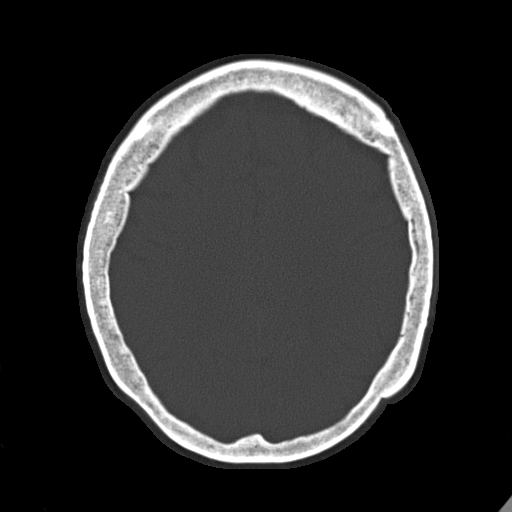

[Series 203: coronal st, idose (1) · coronal · 0.39mm/px · 3 of 63 slices shown]
[im 21/63  brain]
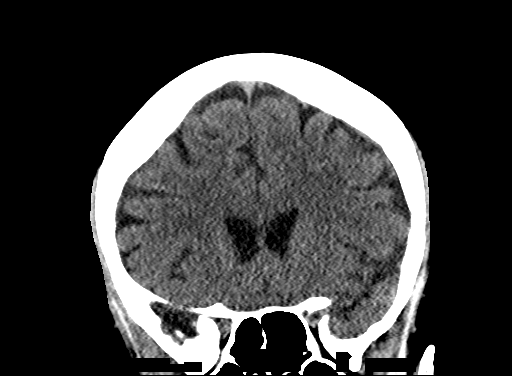
[im 28/63  brain]
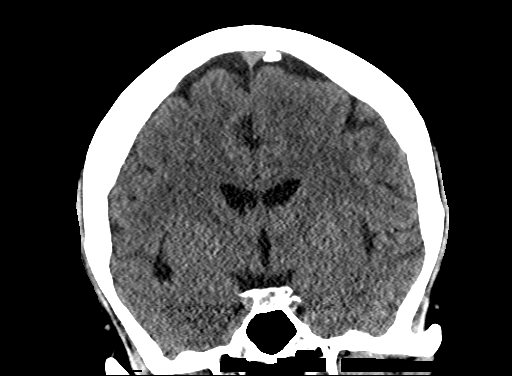
[im 35/63  brain]
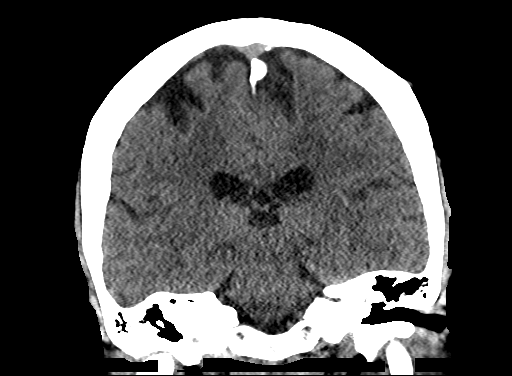

[Series 204: sagittal st, idose (1) · sagittal · 0.39mm/px · 3 of 64 slices shown]
[im 22/64  brain]
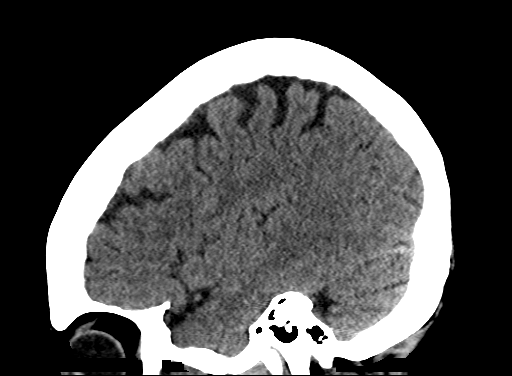
[im 32/64  brain]
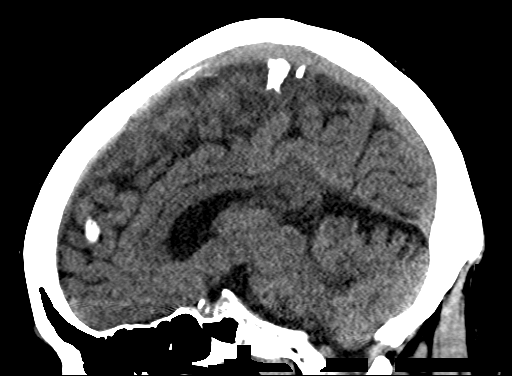
[im 43/64  brain]
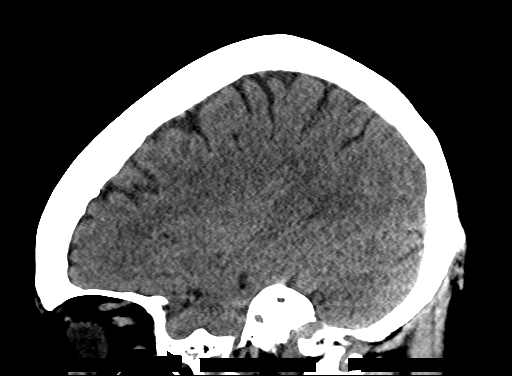

[19 of 47 positions shown; findings below may reference images not displayed]

FINDINGS: Periventricular and subcortical white matter hypodensity compatible
with chronic microvascular ischemic changes. No evidence for acute
cortically based infarct, intracranial hemorrhage, mass lesion or
mass-effect. Orbits are unremarkable. Paranasal sinuses are well
aerated. Mastoid air cells are unremarkable. Calvarium is intact.
IMPRESSION: No acute intracranial process.

Chronic microvascular ischemic changes.

## 2016-12-14 ENCOUNTER — Telehealth: Payer: Self-pay | Admitting: Neurology

## 2016-12-14 NOTE — Telephone Encounter (Signed)
Caller: Kelli Martinez  Urgent? No  Reason for the call: Regarding her having her PT set up. Thanks

## 2016-12-16 ENCOUNTER — Other Ambulatory Visit: Payer: Self-pay | Admitting: Endocrinology

## 2016-12-18 ENCOUNTER — Other Ambulatory Visit: Payer: Self-pay

## 2016-12-18 NOTE — Telephone Encounter (Signed)
Patient said that no one has called her about PT.  Sent a new referral to [facility].

## 2016-12-25 ENCOUNTER — Other Ambulatory Visit: Payer: Self-pay | Admitting: Endocrinology

## 2016-12-27 DIAGNOSIS — M79601 Pain in right arm: Secondary | ICD-10-CM | POA: Diagnosis not present

## 2016-12-27 DIAGNOSIS — M79605 Pain in left leg: Secondary | ICD-10-CM | POA: Diagnosis not present

## 2016-12-27 DIAGNOSIS — M79604 Pain in right leg: Secondary | ICD-10-CM | POA: Diagnosis not present

## 2016-12-27 DIAGNOSIS — M545 Low back pain: Secondary | ICD-10-CM | POA: Diagnosis not present

## 2017-01-01 ENCOUNTER — Telehealth: Payer: Self-pay | Admitting: Endocrinology

## 2017-01-01 ENCOUNTER — Other Ambulatory Visit: Payer: Self-pay | Admitting: Endocrinology

## 2017-01-01 ENCOUNTER — Telehealth: Payer: Self-pay

## 2017-01-01 ENCOUNTER — Telehealth: Payer: Self-pay | Admitting: Internal Medicine

## 2017-01-01 MED ORDER — AZITHROMYCIN 250 MG PO TABS: ORAL_TABLET | ORAL | 0 refills | Status: DC

## 2017-01-01 NOTE — Telephone Encounter (Signed)
Patients son called in to advise that the patient is having sinus infection sx. States that she has green sputum and a heavy cough . It started Saturday. Please advise on plan of care. Sending to megan f per flow mgr.  C/b # is [phone]

## 2017-01-01 NOTE — Telephone Encounter (Signed)
Son Kelli Martinez, calling back to check on status of instructions for his mom who is sick with cold/sinus infection.  Thank you,  -LL

## 2017-01-01 NOTE — Telephone Encounter (Signed)
Son calling again.

## 2017-01-01 NOTE — Telephone Encounter (Signed)
Called and spoke to Kelli Martinez to let him know that Dr. Dwyane Dee was going to send in a prescription for the Zpak for her to take for the possible infection.

## 2017-01-01 NOTE — Telephone Encounter (Signed)
Spoke with the pt's son  He states pt coughing up green sputum  He called PCP and they called in zpack already  Nothing further needed  He will call back and schedule her for ov if she is not improving

## 2017-01-01 NOTE — Telephone Encounter (Signed)
Please advise. Has had a fever today and yesterday. Hurts on right side and back when she coughs. Please see note. Please advise.

## 2017-01-02 NOTE — Telephone Encounter (Signed)
Zithromax was prescribed yesterday

## 2017-01-05 ENCOUNTER — Ambulatory Visit: Payer: Medicare Other

## 2017-01-08 DIAGNOSIS — M79604 Pain in right leg: Secondary | ICD-10-CM | POA: Diagnosis not present

## 2017-01-08 DIAGNOSIS — M79601 Pain in right arm: Secondary | ICD-10-CM | POA: Diagnosis not present

## 2017-01-08 DIAGNOSIS — M545 Low back pain: Secondary | ICD-10-CM | POA: Diagnosis not present

## 2017-01-08 DIAGNOSIS — M79605 Pain in left leg: Secondary | ICD-10-CM | POA: Diagnosis not present

## 2017-01-11 DIAGNOSIS — M79605 Pain in left leg: Secondary | ICD-10-CM | POA: Diagnosis not present

## 2017-01-11 DIAGNOSIS — M79601 Pain in right arm: Secondary | ICD-10-CM | POA: Diagnosis not present

## 2017-01-11 DIAGNOSIS — M545 Low back pain: Secondary | ICD-10-CM | POA: Diagnosis not present

## 2017-01-11 DIAGNOSIS — M79604 Pain in right leg: Secondary | ICD-10-CM | POA: Diagnosis not present

## 2017-01-12 ENCOUNTER — Ambulatory Visit (INDEPENDENT_AMBULATORY_CARE_PROVIDER_SITE_OTHER): Payer: Medicare Other | Admitting: Endocrinology

## 2017-01-12 ENCOUNTER — Ambulatory Visit (INDEPENDENT_AMBULATORY_CARE_PROVIDER_SITE_OTHER): Payer: Medicare Other | Admitting: General Practice

## 2017-01-12 ENCOUNTER — Encounter: Payer: Self-pay | Admitting: Endocrinology

## 2017-01-12 VITALS — BP 120/70 | HR 68 | Ht 68.0 in | Wt 226.4 lb

## 2017-01-12 DIAGNOSIS — Z5181 Encounter for therapeutic drug level monitoring: Secondary | ICD-10-CM | POA: Diagnosis not present

## 2017-01-12 DIAGNOSIS — E161 Other hypoglycemia: Secondary | ICD-10-CM | POA: Diagnosis not present

## 2017-01-12 DIAGNOSIS — E1142 Type 2 diabetes mellitus with diabetic polyneuropathy: Secondary | ICD-10-CM

## 2017-01-12 LAB — POCT INR: INR: 2.9

## 2017-01-12 MED ORDER — METFORMIN HCL 500 MG PO TABS: 500.0000 mg | ORAL_TABLET | Freq: Every day | ORAL | 3 refills | Status: DC

## 2017-01-12 NOTE — Progress Notes (Signed)
Patient ID: Kelli Martinez, female   DOB: 05/19/1946, 71 y.o.   MRN: [id number]    Chief complaint: Followup of various issues   History of Present Illness:  The patient is seen for the following problems:  1. Postprandial hypoglycemia related to dumping syndrome   She has had postprandial hypoglycemia starting a couple of years after her gastric bypass surgery Her symptoms of blood sugars are mostly a feeling of significant weakness, some shakiness.  She will use fruits or juice in order to relieve the symptoms She has been to the dietitian and has been instructed on balanced low-fat meals with enough protein consistently and restricting carbohydrates including fruits   Recent history:  She has been treated with acarbose with improvement. She did try verapamil empirically but this did not help.  She is taking her Precose usually right before eating, 2 tablets at breakfast 1 at lunch and 1 at dinner  For breakfast she will have more recently only a boiled egg and fruit Also cutting back on carbohydrate overall with less bread  With cutting back on carbohydrate and also increasing her acarbose up to 50 mg at lunchtime she has not had any episodes of hypoglycemia She says that she feels fairly good when her blood sugars are between 90-110 and she feels bad when they are either higher or lower, she feels listless and somewhat weaker if the blood sugars are out of range even in the morning when she wakes up  No low sugars documented to be low overnight or any other time recently  Recent blood sugar range 70-1 46, highest after breakfast and lowest before supper Overall AVERAGE 109  Wt Readings from Last 3 Encounters:  01/12/17 226 lb 6.4 oz (102.7 kg)  11/30/16 233 lb 6.4 oz (105.9 kg)  11/24/16 231 lb 3.2 oz (104.9 kg)     2.  History of diabetes:   Overall blood sugars have been Reasonably controlled since her bariatric surgery without any medications. She is  taking Precose mostly to prevent reactive hypoglycemia  Usually has a normal A1c readings around 6.2-6.6, last was 6.8 in April Not clear which blood sugars on her monitor are after breakfast she had a reading of 108 fasting Highest reading 146 after breakfast She will have a snack with a little protein at bedtime and she thinks this makes her sugar higher in the morning but she does this to prevent hypoglycemia overnight  Recently has  lost weight and is also starting water exercises for physical therapy Overall cutting back on carbohydrates as above   Lab Results  Component Value Date   HGBA1C 6.8 11/27/2016   HGBA1C 6.5 08/01/2016   HGBA1C 6.5 04/24/2016   Lab Results  Component Value Date   MICROALBUR <0.7 08/01/2016   LDLCALC 68 08/01/2016   CREATININE 1.02 11/27/2016      Other active problems: see review of systems      Allergies as of 01/12/2017      Reactions   Adhesive [tape] Rash      Medication List       Accurate as of 01/12/17 11:25 AM. Always use your most recent med list.          acarbose 25 MG tablet Commonly known as:  PRECOSE TAKE 1 TABLET THREE TIMES A DAY   albuterol 108 (90 Base) MCG/ACT inhaler Commonly known as:  PROAIR HFA USE 2 INHALATIONS FOUR TIMES A DAY AS NEEDED ( AS NEEDED RESCUE INHALER)  atorvastatin 20 MG tablet Commonly known as:  LIPITOR TAKE 1 TABLET DAILY   azithromycin 250 MG tablet Commonly known as:  ZITHROMAX Z-PAK 2 tablets today and then 1 tablet daily   baclofen 10 MG tablet Commonly known as:  LIORESAL Take 1 tablet 4 times daily   clobetasol cream 0.05 % Commonly known as:  TEMOVATE Apply 1 application topically 2 (two) times daily.   ferrous sulfate 325 (65 FE) MG tablet Take 325 mg by mouth daily.   fluticasone 50 MCG/ACT nasal spray Commonly known as:  FLONASE Place 2 sprays into both nostrils daily.   Fluticasone-Salmeterol 100-50 MCG/DOSE Aepb Commonly known as:  ADVAIR DISKUS USE 1  INHALATION EVERY 12 HOURS, RINSE MOUTH   freestyle lancets USE AS INSTRUCTED TO CHECK BLOOD SUGAR ONCE DAILY   FREESTYLE LITE Devi Use to check blood sugar once a day DX code E11.65   FREESTYLE FREEDOM LITE w/Device Kit use as directed   FREESTYLE LITE test strip Generic drug:  glucose blood USE AS INSTRUCTED TO CHECK BLOOD SUGAR ONCE DAILY   furosemide 40 MG tablet Commonly known as:  LASIX TAKE 1 TABLET EVERY MORNING   gabapentin 300 MG capsule Commonly known as:  NEURONTIN TAKE 1 CAPSULE FOUR TIMES A DAY   HYDROcodone-acetaminophen 5-325 MG tablet Commonly known as:  NORCO Take 1 tablet by mouth every 4 (four) hours as needed for moderate pain.   hydrOXYzine 25 MG tablet Commonly known as:  ATARAX/VISTARIL Take 1 tablet (25 mg total) by mouth 3 (three) times daily as needed.   levothyroxine 125 MCG tablet Commonly known as:  SYNTHROID, LEVOTHROID TAKE 1 TABLET DAILY BEFORE BREAKFAST   magnesium chloride 64 MG Tbec SR tablet Commonly known as:  SLOW-MAG Take 1 tablet (64 mg total) by mouth 2 (two) times daily.   methylcellulose 1 % ophthalmic solution Commonly known as:  ARTIFICIAL TEARS Place 1 drop into both eyes as needed. Dry eyes   NON FORMULARY CPAP 6 [employer]   nortriptyline 10 MG capsule Commonly known as:  PAMELOR Start nortriptyline 32m at bedtime for 2 weeks, then increase to 2 tablet at bedtime   omeprazole 20 MG capsule Commonly known as:  PRILOSEC TAKE 1 CAPSULE DAILY   spironolactone 50 MG tablet Commonly known as:  ALDACTONE TAKE 1 TABLET TWICE A DAY   temazepam 15 MG capsule Commonly known as:  RESTORIL 1-2 for sleep as needed   triamcinolone cream 0.1 % Commonly known as:  KENALOG Apply 1 application topically 2 (two) times daily.   vitamin B-12 500 MCG tablet Commonly known as:  CYANOCOBALAMIN Take 500 mcg by mouth daily.   Vitamin D (Ergocalciferol) 50000 units Caps capsule Commonly known as:  DRISDOL TAKE 1 CAPSULE EVERY  FRIDAY   warfarin 10 MG tablet Commonly known as:  COUMADIN One tablet Mon, Wed, Fri   warfarin 5 MG tablet Commonly known as:  COUMADIN TAKE 2 TABLETS ONCE DAILY (THIS IS TO REPLACE THE 10 MG TABLET)       Allergies:  Allergies  Allergen Reactions  . Adhesive [Tape] Rash    Past Medical History:  Diagnosis Date  . Allergy   . Anemia   . Arthritis   . Asthma   . Back pain, chronic    GETS INJECTIONS IN BACK  . Blind right eye    hemorrhage  . Blood transfusion   . Cancer ([facility]    breast 1994  . Clotting disorder ([facility]   . Diabetes mellitus without  complication ([place])    takes precose  . Elevated liver enzymes   . Gallstones   . GERD (gastroesophageal reflux disease)   . HX: breast cancer   . Hyperlipidemia   . Hypertension   . Hypothyroid   . Lymphedema of arm    RT  . Morbid obesity ([place])   . OSA (obstructive sleep apnea)    has not used in 2 years-lost weight  . Osteoporosis   . Peripheral neuropathy    on gabapentin  . PONV (postoperative nausea and vomiting)   . Psoriasis   . Pulmonary embolism ([place]) 1998 / 1994 /1968  . Syncope and collapse 2011   due to anemia    Past Surgical History:  Procedure Laterality Date  . ABDOMINAL HYSTERECTOMY    . BONE MARROW TRANSPLANT  1994  . CARDIOVASCULAR STRESS TEST  05/23/2005   EF 53%  . carpal tunnell     bil  . cataracts    . CHOLECYSTECTOMY  05/10/2012   Procedure: LAPAROSCOPIC CHOLECYSTECTOMY WITH INTRAOPERATIVE CHOLANGIOGRAM;  Surgeon: Pedro Earls, MD;  Location: [facility];  Service: General;  Laterality: N/A;  Laparoscopic Cholecystectomy with Intraoperative Cholangiogram  . GASTRIC BYPASS  2011   bariatric surgery  . HIATAL HERNIA REPAIR    . IVC filter     recurrent DVT  . KNEE ARTHROTOMY  1998  . MASS EXCISION Right 11/24/2016   Procedure: EXCISION RIGHT AXILLARY LESION AND CHEST WALL LESION;  Surgeon: Johnathan Hausen, MD;  Location: [facility];  Service: General;  Laterality:  Right;  . MASTECTOMY     Right  . US ECHOCARDIOGRAPHY  10/27/08   EF 55-60%    Family History  Problem Relation Age of Onset  . Sudden death Mother        car accident  . Cancer Father 95       lung  . Cancer Sister 42       lung cancer  . Asthma Daughter     Social History:  reports that she quit smoking about 38 years ago. Her smoking use included Cigarettes. She has a 5.00 pack-year smoking history. She has never used smokeless tobacco. She reports that she does not drink alcohol or use drugs.  Review of Systems   Neuropathy with burning and lower leg pain: Evaluated by neurologist and on nortriptyline    EDEMA: She has had problems with  edema both in her legs and right arm Has been on 50 mg bid of Aldactone   She has taken Lasix as needed She thinks that recently edema is better especially with her doing water exercises She has not been using the compression stockings as prescribed because of cost   Lymphedema right arm, secondary to  breast surgery, using  elastic compression stocking, needs another one   History of mild hypothyroidism: Adequately replaced  with 62.5 mcg ,  has consistently normal TSH   Lab Results  Component Value Date   TSH 1.24 11/27/2016    Hypercholesterolemia: LDL has been at target with Lipitor 10 mg Taking this also for cardiovascular prophylaxis  Recent levels:  Lab Results  Component Value Date   CHOL 164 08/01/2016   HDL 86.80 08/01/2016   LDLCALC 68 08/01/2016   TRIG 47.0 08/01/2016   CHOLHDL 2 08/01/2016    Previous history of hypertension: Is not on any medications except diuretics.    BP Readings from Last 3 Encounters:  01/12/17 120/70  11/30/16 107/76  11/24/16 Marland Kitchen)  125/93      LABS:  Anti-coag visit on 01/12/2017  Component Date Value Ref Range Status  . INR 01/12/2017 2.9   Final     EXAM:  BP 120/70   Pulse 68   Ht _0  (1.727 m)   Wt 226 lb 6.4 oz (102.7 kg)   SpO2 98%   BMI 34.42 kg/m      Assessment/Plan:   1. Reactive/postprandial hypoglycemia from dumping syndrome. Symptoms are better controlled with adding Precose 50 mg now at lunchtime Also she is cutting back on carbohydrate intake and balancing meals better as directed Also has lost weight   2.  Constipation: She thinks this is from nortriptyline and she will discuss that with neurologist Since she has tried most OTC laxative she can try Dulcolax tablets now  3. Mild diabetes: A1c Is relatively higher and sometimes has higher fasting readings including the lab Will give her a trial of metformin at bedtime now Continue Precose  4.  Obesity, weight is somewhat improving and she is trying to be more active now also Complemented her on the ability to lose some weight and improve her diet  5.  EDEMA of the legs: Relatively better now   Patient Instructions  Take metformin at bedtime with protein snack    Kelli Martinez 01/12/2017, 11:25 AM

## 2017-01-12 NOTE — Patient Instructions (Signed)
Take metformin at bedtime with protein snack

## 2017-01-13 ENCOUNTER — Other Ambulatory Visit: Payer: Self-pay | Admitting: Endocrinology

## 2017-01-18 DIAGNOSIS — M545 Low back pain: Secondary | ICD-10-CM | POA: Diagnosis not present

## 2017-01-18 DIAGNOSIS — M79605 Pain in left leg: Secondary | ICD-10-CM | POA: Diagnosis not present

## 2017-01-18 DIAGNOSIS — M79604 Pain in right leg: Secondary | ICD-10-CM | POA: Diagnosis not present

## 2017-01-18 DIAGNOSIS — M79601 Pain in right arm: Secondary | ICD-10-CM | POA: Diagnosis not present

## 2017-01-22 ENCOUNTER — Ambulatory Visit (INDEPENDENT_AMBULATORY_CARE_PROVIDER_SITE_OTHER): Payer: Medicare Other | Admitting: Neurology

## 2017-01-22 ENCOUNTER — Encounter: Payer: Self-pay | Admitting: Neurology

## 2017-01-22 VITALS — BP 120/80 | HR 76 | Ht 68.0 in | Wt 225.1 lb

## 2017-01-22 DIAGNOSIS — R202 Paresthesia of skin: Secondary | ICD-10-CM | POA: Diagnosis not present

## 2017-01-22 DIAGNOSIS — G629 Polyneuropathy, unspecified: Secondary | ICD-10-CM

## 2017-01-22 MED ORDER — GABAPENTIN 300 MG PO CAPS: 600.0000 mg | ORAL_CAPSULE | Freq: Three times a day (TID) | ORAL | 3 refills | Status: DC

## 2017-01-22 NOTE — Progress Notes (Signed)
Follow-up Visit   Date: 01/22/17    Kelli Martinez MRN: [id number] DOB: [birth year]   Interim History: Kelli Martinez is a 71 y.o. right-handed African American female with hypothyroidism, pulmonary embolism, hyperlipidemia, RLS, history of breast cancer, s/p gastric bypass (2011) returning to the clinic for follow-up of neuropathy.  The patient was accompanied to the clinic by self.  History of present illness: She has history of neuropathy affecting the feet for many years, but this has worsened over the past 5 years.  Symptoms started with numbness and tingling involving the feet and over time has involved her lower legs and hands.  She has sensitivity to light touch over the feet.  She takes gabapentin 626m 6 times daily, which provides some relief.  She does not have weakness and has not suffered any falls.  She stumbles at times and walks with a cane as needed.   Starting around 2017, she began having tingling sensation over the left hip and radiate down her leg.  [facility].  MRI lumbar spine from 2008 shows degenerative changes and facet disease at L4-5 and small disc protrusion at L5-S1.  She had not had any updated imaging or tried physical therapy.    She had gastric bypass in 2011 and has postprandial hypoglycemia.  She has history of diabetes, last HbA1c 6.5.  She does not drink alcohol and there is no family history of neuropathy.  UPDATE 01/22/2017:  She is here for 3 month follow-up.  Since starting nortriptyline, she noticed marked improvement in her neuropathy, but she complains of extreme dry mouth, dry eyes, and constipation.  She feels that there is worsening tingling in the hands and she occassionally has muscle cramps and takes tonic water. There is no weakness or neck pain. She suffered a fall last week because her knee buckled due to knee pain.    Medications:  Current Outpatient Prescriptions on  File Prior to Visit  Medication Sig Dispense Refill  . acarbose (PRECOSE) 25 MG tablet TAKE 1 TABLET THREE TIMES A DAY 270 tablet 1  . albuterol (PROAIR HFA) 108 (90 Base) MCG/ACT inhaler USE 2 INHALATIONS FOUR TIMES A DAY AS NEEDED ( AS NEEDED RESCUE INHALER) 54 g 3  . atorvastatin (LIPITOR) 20 MG tablet TAKE 1 TABLET DAILY 90 tablet 2  . baclofen (LIORESAL) 10 MG tablet Take 1 tablet 4 times daily 360 each 1  . Blood Glucose Monitoring Suppl (FREESTYLE FREEDOM LITE) W/DEVICE KIT use as directed 1 each 0  . Blood Glucose Monitoring Suppl (FREESTYLE LITE) DEVI Use to check blood sugar once a day DX code E11.65 1 each 0  . clobetasol cream (TEMOVATE) 09.82% Apply 1 application topically 2 (two) times daily.    . ferrous sulfate 325 (65 FE) MG tablet Take 325 mg by mouth daily.      . fluticasone (FLONASE) 50 MCG/ACT nasal spray Place 2 sprays into both nostrils daily. 48 g 3  . Fluticasone-Salmeterol (ADVAIR DISKUS) 100-50 MCG/DOSE AEPB USE 1 INHALATION EVERY 12 HOURS, RINSE MOUTH 180 each 3  . FREESTYLE LITE test strip USE AS INSTRUCTED TO CHECK BLOOD SUGAR ONCE DAILY 100 each 1  . furosemide (LASIX) 40 MG tablet TAKE 1 TABLET EVERY MORNING 90 tablet 1  . gabapentin (NEURONTIN) 300 MG capsule TAKE 1 CAPSULE FOUR TIMES A DAY 360 capsule 1  . HYDROcodone-acetaminophen (NORCO) 5-325 MG tablet Take 1 tablet by mouth every  4 (four) hours as needed for moderate pain. (Patient taking differently: Take 1 tablet by mouth as needed for moderate pain. ) 30 tablet 0  . hydrOXYzine (ATARAX/VISTARIL) 25 MG tablet Take 1 tablet (25 mg total) by mouth 3 (three) times daily as needed. 30 tablet 0  . Lancets (FREESTYLE) lancets USE AS INSTRUCTED TO CHECK BLOOD SUGAR ONCE DAILY 100 each 1  . levothyroxine (SYNTHROID, LEVOTHROID) 125 MCG tablet TAKE 1 TABLET DAILY BEFORE BREAKFAST 90 tablet 1  . magnesium chloride (SLOW-MAG) 64 MG TBEC SR tablet Take 1 tablet (64 mg total) by mouth 2 (two) times daily. 60 tablet 6    . metFORMIN (GLUCOPHAGE) 500 MG tablet Take 1 tablet (500 mg total) by mouth at bedtime. 30 tablet 3  . methylcellulose (ARTIFICIAL TEARS) 1 % ophthalmic solution Place 1 drop into both eyes as needed. Dry eyes    . NON FORMULARY CPAP 6 [employer]    . nortriptyline (PAMELOR) 10 MG capsule Start nortriptyline 10m at bedtime for 2 weeks, then increase to 2 tablet at bedtime 60 capsule 5  . omeprazole (PRILOSEC) 20 MG capsule TAKE 1 CAPSULE DAILY 90 capsule 0  . spironolactone (ALDACTONE) 50 MG tablet TAKE 1 TABLET TWICE A DAY 180 tablet 1  . temazepam (RESTORIL) 15 MG capsule 1-2 for sleep as needed 90 capsule 1  . triamcinolone cream (KENALOG) 0.1 % Apply 1 application topically 2 (two) times daily. 30 g 1  . vitamin B-12 (CYANOCOBALAMIN) 500 MCG tablet Take 500 mcg by mouth daily.      . Vitamin D, Ergocalciferol, (DRISDOL) 50000 units CAPS capsule TAKE 1 CAPSULE EVERY FRIDAY 12 capsule 1  . warfarin (COUMADIN) 10 MG tablet One tablet Mon, Wed, Fri    . warfarin (COUMADIN) 5 MG tablet TAKE 2 TABLETS ONCE DAILY (THIS IS TO REPLACE THE 10 MG TABLET) (Patient taking differently: TAKE 2 TABLETS ONCE DAILY (THIS IS TO REPLACE THE 10 MG TABLET) on Sunday, Tuesday, Wednesday, and Thursday takes 122m on Monday and Friday takes 3 tablets (1562m 180 tablet 1   No current facility-administered medications on file prior to visit.     Allergies:  Allergies  Allergen Reactions  . Adhesive [Tape] Rash    Review of Systems:  CONSTITUTIONAL: No fevers, chills, night sweats, or weight loss.  EYES: No visual changes or eye pain ENT: No hearing changes.  No history of nose bleeds.   RESPIRATORY: No cough, wheezing and shortness of breath.   CARDIOVASCULAR: Negative for chest pain, and palpitations.   GI: Negative for abdominal discomfort, blood in stools or black stools.  No recent change in bowel habits.   GU:  No history of incontinence.   MUSCLOSKELETAL: No history of joint pain or swelling.  No  myalgias.   SKIN: Negative for lesions, rash, and itching.   ENDOCRINE: Negative for cold or heat intolerance, polydipsia or goiter.   PSYCH:  No depression or anxiety symptoms.   NEURO: As Above.   Vital Signs:  BP 120/80   Pulse 76   Ht 5' 8"  (1.727 m)   Wt 225 lb 2 oz (102.1 kg)   SpO2 98%   BMI 34.23 kg/m    Neurological Exam: MENTAL STATUS including orientation to time, place, person, recent and remote memory, attention span and concentration, language, and fund of knowledge is normal.  Speech is not dysarthric.  CRANIAL NERVES:  Pupils equal round and reactive to light.  Normal conjugate, extra-ocular eye movements in all directions of gaze.  No ptosis. Normal facial sensation.  Face is symmetric. Palate elevates symmetrically.  Tongue is midline.  MOTOR:  Motor strength is 5/5 in all extremities, including intrinsic hand muscles.  No atrophy, fasciculations or abnormal movements.  No pronator drift.  Tone is normal.    MSRs:  Reflexes are 2+/4 throughout, except trace at the knees and absent at the ankles.  SENSORY:  Gradient pattern of sensory less distal to knees and hands bilaterally to pin prick, temperature, light touch, and vibration.  Romberg's sign present.  COORDINATION/GAIT:  Normal finger-to- nose-finger and heel-to-shin.  Intact rapid alternating movements bilaterally.  Gait narrow based and stable.   Data: Labs 10/09/2016:  ESR 29, CRP 0.1, ANA neg, SSA/B neg, vitamin B12 936, vitamin B1 10, copper 186, folate 12.6  IMPRESSION/PLAN: 1.  Peripheral neuropathy following glove-stocking distribution.  Although history of diabetes and chemotherapy may be contributing to neuropathy, the fact that symptoms have progressed over the past few years, even when sugars are well controlled are atypical.  She is agreeable to proceeding with NCS/EMG now.  Neuropathy labs looking for inflammation, autoimmune disease, and nutritional deficiency returned normal.  Unfortunately, she  is having side effects to nortriptyline (dry eyes, dry mouth, constipation), so I will taper her off this.   2.  Lumbar radiculopathy causing descending pain and paresthesias of the legs.  She sees Dr. Nelva Bush for this.  3.  History of bilateral CTS release   PLAN/RECOMMENDATIONS:  1.  Reduce nortriptyline to 47m at bedtime (1 tablet) for 2 weeks, if dry mouth/eyes and constipation improved, continue at 169mat bedtime.  If better, stop nortriptyline. 2.  Continue gabapentin 60097mhree times daily 3.  Consider Cymbalta going forward 4.  NCS/EMG of the arms  Return to clinic in 6 months.     The duration of this appointment visit was 30 minutes of face-to-face time with the patient.  Greater than 50% of this time was spent in counseling, explanation of diagnosis, planning of further management, and coordination of care.   Thank you for allowing me to participate in patient's care.  If I can answer any additional questions, I would be pleased to do so.    Sincerely,    Aundrey Elahi K. PatPosey ProntoO

## 2017-01-22 NOTE — Patient Instructions (Signed)
1.  Reduce nortriptyline to 10mg  at bedtime (1 tablet) for 2 weeks, if dry mouth/eyes and constipation improved, continue at 10mg  at bedtime.  If better, stop nortriptyline. 2.  Continue gabapentin 600mg  three times daily 3.  Consider Cymbalta going forward 4.  NCS/EMG of the arms in August   Return to clinic in 6 months.

## 2017-01-23 DIAGNOSIS — M79601 Pain in right arm: Secondary | ICD-10-CM | POA: Diagnosis not present

## 2017-01-23 DIAGNOSIS — M79605 Pain in left leg: Secondary | ICD-10-CM | POA: Diagnosis not present

## 2017-01-23 DIAGNOSIS — M545 Low back pain: Secondary | ICD-10-CM | POA: Diagnosis not present

## 2017-01-23 DIAGNOSIS — M79604 Pain in right leg: Secondary | ICD-10-CM | POA: Diagnosis not present

## 2017-01-26 ENCOUNTER — Other Ambulatory Visit: Payer: Self-pay | Admitting: Endocrinology

## 2017-01-29 DIAGNOSIS — M545 Low back pain: Secondary | ICD-10-CM | POA: Diagnosis not present

## 2017-01-29 DIAGNOSIS — M79601 Pain in right arm: Secondary | ICD-10-CM | POA: Diagnosis not present

## 2017-01-29 DIAGNOSIS — M79604 Pain in right leg: Secondary | ICD-10-CM | POA: Diagnosis not present

## 2017-01-29 DIAGNOSIS — M79605 Pain in left leg: Secondary | ICD-10-CM | POA: Diagnosis not present

## 2017-02-05 DIAGNOSIS — M1711 Unilateral primary osteoarthritis, right knee: Secondary | ICD-10-CM | POA: Diagnosis not present

## 2017-02-05 DIAGNOSIS — M1712 Unilateral primary osteoarthritis, left knee: Secondary | ICD-10-CM | POA: Diagnosis not present

## 2017-02-05 DIAGNOSIS — M170 Bilateral primary osteoarthritis of knee: Secondary | ICD-10-CM | POA: Diagnosis not present

## 2017-02-08 DIAGNOSIS — M545 Low back pain: Secondary | ICD-10-CM | POA: Diagnosis not present

## 2017-02-08 DIAGNOSIS — M79601 Pain in right arm: Secondary | ICD-10-CM | POA: Diagnosis not present

## 2017-02-08 DIAGNOSIS — M79605 Pain in left leg: Secondary | ICD-10-CM | POA: Diagnosis not present

## 2017-02-08 DIAGNOSIS — M79604 Pain in right leg: Secondary | ICD-10-CM | POA: Diagnosis not present

## 2017-02-09 ENCOUNTER — Ambulatory Visit (INDEPENDENT_AMBULATORY_CARE_PROVIDER_SITE_OTHER): Payer: Medicare Other | Admitting: General Practice

## 2017-02-09 DIAGNOSIS — Z5181 Encounter for therapeutic drug level monitoring: Secondary | ICD-10-CM | POA: Diagnosis not present

## 2017-02-09 LAB — POCT INR: INR: 2.9

## 2017-02-09 NOTE — Patient Instructions (Signed)
Pre visit review using our clinic review tool, if applicable. No additional management support is needed unless otherwise documented below in the visit note. 

## 2017-02-13 ENCOUNTER — Emergency Department (HOSPITAL_COMMUNITY)
Admission: EM | Admit: 2017-02-13 | Discharge: 2017-02-14 | Disposition: A | Discharge status: Home / Self Care | Payer: Medicare Other | Attending: Emergency Medicine | Admitting: Emergency Medicine

## 2017-02-13 ENCOUNTER — Encounter (HOSPITAL_COMMUNITY): Payer: Self-pay | Admitting: Emergency Medicine

## 2017-02-13 DIAGNOSIS — Z87891 Personal history of nicotine dependence: Secondary | ICD-10-CM | POA: Diagnosis not present

## 2017-02-13 DIAGNOSIS — M549 Dorsalgia, unspecified: Secondary | ICD-10-CM | POA: Insufficient documentation

## 2017-02-13 DIAGNOSIS — R252 Cramp and spasm: Secondary | ICD-10-CM | POA: Diagnosis not present

## 2017-02-13 DIAGNOSIS — Z7984 Long term (current) use of oral hypoglycemic drugs: Secondary | ICD-10-CM | POA: Diagnosis not present

## 2017-02-13 DIAGNOSIS — E161 Other hypoglycemia: Secondary | ICD-10-CM | POA: Diagnosis not present

## 2017-02-13 DIAGNOSIS — M62838 Other muscle spasm: Secondary | ICD-10-CM

## 2017-02-13 DIAGNOSIS — L231 Allergic contact dermatitis due to adhesives: Secondary | ICD-10-CM | POA: Insufficient documentation

## 2017-02-13 DIAGNOSIS — E162 Hypoglycemia, unspecified: Secondary | ICD-10-CM | POA: Diagnosis not present

## 2017-02-13 DIAGNOSIS — I10 Essential (primary) hypertension: Secondary | ICD-10-CM | POA: Diagnosis not present

## 2017-02-13 DIAGNOSIS — E039 Hypothyroidism, unspecified: Secondary | ICD-10-CM | POA: Insufficient documentation

## 2017-02-13 DIAGNOSIS — Z79899 Other long term (current) drug therapy: Secondary | ICD-10-CM | POA: Diagnosis not present

## 2017-02-13 DIAGNOSIS — E119 Type 2 diabetes mellitus without complications: Secondary | ICD-10-CM | POA: Insufficient documentation

## 2017-02-13 DIAGNOSIS — J45909 Unspecified asthma, uncomplicated: Secondary | ICD-10-CM | POA: Diagnosis not present

## 2017-02-13 DIAGNOSIS — Z7901 Long term (current) use of anticoagulants: Secondary | ICD-10-CM | POA: Insufficient documentation

## 2017-02-13 DIAGNOSIS — Z853 Personal history of malignant neoplasm of breast: Secondary | ICD-10-CM | POA: Insufficient documentation

## 2017-02-13 DIAGNOSIS — R61 Generalized hyperhidrosis: Secondary | ICD-10-CM | POA: Insufficient documentation

## 2017-02-13 LAB — CBC
HCT: 36.4 % (ref 36.0–46.0)
Hemoglobin: 11.4 g/dL — ABNORMAL LOW (ref 12.0–15.0)
MCH: 27.5 pg (ref 26.0–34.0)
MCHC: 31.3 g/dL (ref 30.0–36.0)
MCV: 87.9 fL (ref 78.0–100.0)
PLATELETS: 181 10*3/uL (ref 150–400)
RBC: 4.14 MIL/uL (ref 3.87–5.11)
RDW: 15.2 % (ref 11.5–15.5)
WBC: 5.6 10*3/uL (ref 4.0–10.5)

## 2017-02-13 LAB — COMPREHENSIVE METABOLIC PANEL
ALBUMIN: 3.7 g/dL (ref 3.5–5.0)
ALK PHOS: 88 U/L (ref 38–126)
ALT: 42 U/L (ref 14–54)
AST: 43 U/L — ABNORMAL HIGH (ref 15–41)
Anion gap: 11 (ref 5–15)
BUN: 21 mg/dL — AB (ref 6–20)
CALCIUM: 9.3 mg/dL (ref 8.9–10.3)
CO2: 21 mmol/L — AB (ref 22–32)
CREATININE: 1.07 mg/dL — AB (ref 0.44–1.00)
Chloride: 105 mmol/L (ref 101–111)
GFR calc Af Amer: 60 mL/min — ABNORMAL LOW (ref >60)
GFR calc non Af Amer: 52 mL/min — ABNORMAL LOW (ref >60)
GLUCOSE: 156 mg/dL — AB (ref 65–99)
Potassium: 3.9 mmol/L (ref 3.5–5.1)
SODIUM: 137 mmol/L (ref 135–145)
Total Bilirubin: 0.4 mg/dL (ref 0.3–1.2)
Total Protein: 6.8 g/dL (ref 6.5–8.1)

## 2017-02-13 LAB — CK: Total CK: 134 U/L (ref 38–234)

## 2017-02-13 LAB — CBG MONITORING, ED: GLUCOSE-CAPILLARY: 158 mg/dL — AB (ref 65–99)

## 2017-02-13 LAB — MAGNESIUM: Magnesium: 1.8 mg/dL (ref 1.7–2.4)

## 2017-02-13 MED ORDER — DIAZEPAM 10 MG RE GEL: 5.0000 mg | Freq: Once | RECTAL | 0 refills | Status: DC | PRN

## 2017-02-13 NOTE — ED Triage Notes (Signed)
Pt came via EMS. Pt has hx of generalized muscle cramping. Usually drinks tonic water with relief. Pt told EMS she had the muscle cramps all over without relief, more severe in pain and longer last. Upon arrival, pt's pain as eased off.

## 2017-02-13 NOTE — ED Provider Notes (Signed)
[facility] DEPT Provider Note   CSN: [id number] Arrival date & time: 02/13/17  1753     History   Chief Complaint Chief Complaint  Patient presents with  . Muscle Pain    HPI Kelli Martinez is a 71 y.o. female presenting with a sudden onset generalized muscle cramping starting at 3 PM today she reports that her feet were contracting upwards bilaterally and she can move her legs her face was twitching and she experiences cramping all over with associated diaphoresis. She explains that this has happened once before about a month and a half ago but it wasn't as severe. She felt that this time the pain was so severe that she thought she was going to pass out. She reports taking her blood glucose at 2 PM and it was 45, she then drank some juice and felt better. She is currently taking baclofen but only took 1 dose this morning. She reports that when she got into the ambulance his symptoms subsided and she hasn't had any episodes since. She is now comfortable lying in bed.  HPI  Past Medical History:  Diagnosis Date  . Allergy   . Anemia   . Arthritis   . Asthma   . Back pain, chronic    GETS INJECTIONS IN BACK  . Blind right eye    hemorrhage  . Blood transfusion   . Cancer ([place])    breast 1994  . Clotting disorder ([place])   . Diabetes mellitus without complication ([place])    takes precose  . Elevated liver enzymes   . Gallstones   . GERD (gastroesophageal reflux disease)   . HX: breast cancer   . Hyperlipidemia   . Hypertension   . Hypothyroid   . Lymphedema of arm    RT  . Morbid obesity ([place])   . OSA (obstructive sleep apnea)    has not used in 2 years-lost weight  . Osteoporosis   . Peripheral neuropathy    on gabapentin  . PONV (postoperative nausea and vomiting)   . Psoriasis   . Pulmonary embolism ([place]) 1998 / 1994 /1968  . Syncope and collapse 2011   due to anemia    Patient Active Problem List   Diagnosis Date Noted  . Encounter for therapeutic  drug monitoring 08/26/2013  . Insomnia 05/01/2013  . Long term (current) use of anticoagulants 04/15/2013  . Postsurgical dumping syndrome 04/06/2013  . Bilateral leg edema 04/06/2013  . Reactive hypoglycemia 04/02/2013  . Abdominal pain intermittent-evalulating for internal hernia 11/07/2012  . S/P laparoscopic cholecystectomy 05/31/2012  . Roux Y Gastric Bypass April 2011; repair [facility] 11/22/2011  . Unspecified deficiency anemia 11/14/2011  . Left leg paresthesias 10/27/2010  . Hypothyroid 10/27/2010  . Low back pain 10/27/2010  . Obstructive sleep apnea 09/29/2007  . PULMONARY EMBOLISM 09/25/2007  . Seasonal and perennial allergic rhinitis 09/25/2007  . Allergic asthma, mild persistent, uncomplicated 40/98/1191  . BREAST CANCER, HX OF 09/25/2007    Past Surgical History:  Procedure Laterality Date  . ABDOMINAL HYSTERECTOMY    . BONE MARROW TRANSPLANT  1994  . CARDIOVASCULAR STRESS TEST  05/23/2005   EF 53%  . carpal tunnell     bil  . cataracts    . CHOLECYSTECTOMY  05/10/2012   Procedure: LAPAROSCOPIC CHOLECYSTECTOMY WITH INTRAOPERATIVE CHOLANGIOGRAM;  Surgeon: Pedro Earls, MD;  Location: [facility];  Service: General;  Laterality: N/A;  Laparoscopic Cholecystectomy with Intraoperative Cholangiogram  . GASTRIC BYPASS  2011   bariatric surgery  .  HIATAL HERNIA REPAIR    . IVC filter     recurrent DVT  . KNEE ARTHROTOMY  1998  . MASS EXCISION Right 11/24/2016   Procedure: EXCISION RIGHT AXILLARY LESION AND CHEST WALL LESION;  Surgeon: Johnathan Hausen, MD;  Location: [facility];  Service: General;  Laterality: Right;  . MASTECTOMY     Right  . US ECHOCARDIOGRAPHY  10/27/08   EF 55-60%    OB History    No data available       Home Medications    Prior to Admission medications   Medication Sig Start Date End Date Taking? Authorizing Provider  acarbose (PRECOSE) 25 MG tablet TAKE 1 TABLET THREE TIMES A DAY 12/18/16   Elayne Snare, MD  albuterol [facility]  HFA) 108 (90 Base) MCG/ACT inhaler USE 2 INHALATIONS FOUR TIMES A DAY AS NEEDED ( AS NEEDED RESCUE INHALER) 08/31/16   Baird Lyons D, MD  atorvastatin (LIPITOR) 20 MG tablet TAKE 1 TABLET DAILY 12/01/16   Elayne Snare, MD  baclofen (LIORESAL) 10 MG tablet Take 1 tablet 4 times daily 06/29/16   Elayne Snare, MD  Blood Glucose Monitoring Suppl (FREESTYLE FREEDOM LITE) W/DEVICE KIT use as directed 04/22/15   Elayne Snare, MD  Blood Glucose Monitoring Suppl (FREESTYLE LITE) DEVI Use to check blood sugar once a day DX code E11.65 04/12/15   Elayne Snare, MD  clobetasol cream (TEMOVATE) 9.44 % Apply 1 application topically 2 (two) times daily.    [provider]  diazepam (DIASTAT ACUDIAL) 10 MG GEL Place 5 mg rectally once as needed (cramping). 02/13/17   Drenda Freeze, MD  ferrous sulfate 325 (65 FE) MG tablet Take 325 mg by mouth daily.      [provider]  fluticasone (FLONASE) 50 MCG/ACT nasal spray Place 2 sprays into both nostrils daily. 08/31/16   Baird Lyons D, MD  Fluticasone-Salmeterol (ADVAIR DISKUS) 100-50 MCG/DOSE AEPB USE 1 INHALATION EVERY 12 HOURS, RINSE MOUTH 08/31/16   Baird Lyons D, MD  FREESTYLE LITE test strip USE AS INSTRUCTED TO CHECK BLOOD SUGAR ONCE DAILY 11/20/16   Elayne Snare, MD  furosemide (LASIX) 40 MG tablet TAKE 1 TABLET EVERY MORNING 12/25/16   Elayne Snare, MD  gabapentin (NEURONTIN) 300 MG capsule Take 2 capsules (600 mg total) by mouth 3 (three) times daily. 01/22/17   Narda Amber K, DO  HYDROcodone-acetaminophen (NORCO) 5-325 MG tablet Take 1 tablet by mouth every 4 (four) hours as needed for moderate pain. Patient taking differently: Take 1 tablet by mouth as needed for moderate pain.  11/24/16   Johnathan Hausen, MD  hydrOXYzine (ATARAX/VISTARIL) 25 MG tablet Take 1 tablet (25 mg total) by mouth 3 (three) times daily as needed. 01/27/16   Elayne Snare, MD  Lancets (FREESTYLE) lancets USE AS INSTRUCTED TO CHECK BLOOD SUGAR ONCE DAILY 11/20/16   Elayne Snare,  MD  levothyroxine (SYNTHROID, LEVOTHROID) 125 MCG tablet TAKE 1 TABLET DAILY BEFORE BREAKFAST 11/13/16   Elayne Snare, MD  magnesium chloride (SLOW-MAG) 64 MG TBEC SR tablet Take 1 tablet (64 mg total) by mouth 2 (two) times daily. 07/21/13   Elayne Snare, MD  metFORMIN (GLUCOPHAGE) 500 MG tablet Take 1 tablet (500 mg total) by mouth at bedtime. 01/12/17   Elayne Snare, MD  methylcellulose (ARTIFICIAL TEARS) 1 % ophthalmic solution Place 1 drop into both eyes as needed. Dry eyes    [provider]  NON FORMULARY CPAP 6 [employer]    [provider]  nortriptyline (PAMELOR)  10 MG capsule Start nortriptyline 47m at bedtime for 2 weeks, then increase to 2 tablet at bedtime 10/09/16   PNarda AmberK, DO  omeprazole (PRILOSEC) 20 MG capsule TAKE 1 CAPSULE DAILY 01/15/17   KElayne Snare MD  spironolactone (ALDACTONE) 50 MG tablet TAKE 1 TABLET TWICE A DAY 10/17/16   KElayne Snare MD  temazepam (RESTORIL) 15 MG capsule 1-2 for sleep as needed 11/23/16   YBaird LyonsD, MD  triamcinolone cream (KENALOG) 0.1 % Apply 1 application topically 2 (two) times daily. 01/28/14   KElayne Snare MD  vitamin B-12 (CYANOCOBALAMIN) 500 MCG tablet Take 500 mcg by mouth daily.      [provider]  Vitamin D, Ergocalciferol, (DRISDOL) 50000 units CAPS capsule TAKE 1 CAPSULE EVERY FRIDAY 01/26/17   KElayne Snare MD  warfarin (COUMADIN) 10 MG tablet One tablet Mon, Wed, Fri 10/28/13   KElayne Snare MD  warfarin (COUMADIN) 5 MG tablet TAKE 2 TABLETS ONCE DAILY (THIS IS TO REPLACE THE 10 MG TABLET) Patient taking differently: TAKE 2 TABLETS ONCE DAILY (THIS IS TO REPLACE THE 10 MG TABLET) on Sunday, Tuesday, Wednesday, and Thursday takes 16m on Monday and Friday takes 3 tablets (1571m3/12/18   KumElayne SnareD    Family History Family History  Problem Relation Age of Onset  . Sudden death Mother        car accident  . Cancer Father 70 74    lung  . Cancer Sister 50 66    lung cancer  . Asthma Daughter      Social History Social History  Substance Use Topics  . Smoking status: Former Smoker    Packs/day: 0.50    Years: 10.00    Types: Cigarettes    Quit date: 07/31/1978  . Smokeless tobacco: Never Used  . Alcohol use No     Allergies   Adhesive [tape]   Review of Systems Review of Systems  Constitutional: Negative for chills and fever.  HENT: Negative for ear pain and sore throat.   Eyes: Negative for pain and visual disturbance.  Respiratory: Negative for cough, choking, chest tightness, shortness of breath, wheezing and stridor.   Cardiovascular: Negative for chest pain, palpitations and leg swelling.  Gastrointestinal: Negative for abdominal distention, abdominal pain, nausea and vomiting.  Genitourinary: Negative for dysuria and hematuria.  Musculoskeletal: Positive for back pain and myalgias. Negative for arthralgias, joint swelling, neck pain and neck stiffness.       Chronic back pain and generalized cramping and myalgias prior to her arrival which has resolved.  Skin: Negative for color change, pallor and rash.  Neurological: Negative for dizziness, seizures, syncope, weakness, numbness and headaches.     Physical Exam Updated Vital Signs BP 114/74   Pulse 62   Temp (!) 97.5 F (36.4 C) (Oral)   Resp 20   SpO2 100%   Physical Exam  Constitutional: She is oriented to person, place, and time. She appears well-developed and well-nourished. No distress.  Afebrile, nontoxic-appearing, lying comfortably in bed in no acute distress.  HENT:  Head: Normocephalic and atraumatic.  Eyes: Conjunctivae and EOM are normal.  Neck: Neck supple.  Cardiovascular: Normal rate, regular rhythm, normal heart sounds and intact distal pulses.   No murmur heard. Pulmonary/Chest: Effort normal and breath sounds normal. No respiratory distress. She has no wheezes. She has no rales.  Abdominal: Soft. She exhibits no distension and no mass. There is no tenderness. There is no guarding.  Musculoskeletal: Normal range of motion. She exhibits no edema, tenderness or deformity.  Neurological: She is alert and oriented to person, place, and time. No cranial nerve deficit or sensory deficit. She exhibits normal muscle tone.  Neurologic Exam:  - Mental status: Patient is alert and cooperative. Fluent speech and words are clear. Coherent thought processes and insight is good. Patient is oriented x 4 to person, place, time and event.  - Cranial nerves:  CN III, IV, VI: pupils equally round, reactive to light both direct and conscensual and normal accommodation. Full extra-ocular movement. t. CN VII : muscles of facial expression intact. CN X :  midline uvula. XI strength oftrapezius muscles 5/5, XII: tongue is midline when protruded. - Motor: No involuntary movements. Muscle tone and bulk normal throughout. Muscle strength is 5/5 in bilateral shoulder abduction, elbow flexion and extension,, grip, hip extension, flexion, leg flexion and extension, ankle dorsiflexion and plantar flexion.  - Sensory: Proprioception, light tough sensation intact in all extremities.  - Cerebellar: rapid alternating movements and point to point movement intact in upper extremities.   Skin: Skin is warm and dry. No rash noted. She is not diaphoretic. No erythema. No pallor.  Psychiatric: She has a normal mood and affect.  Nursing note and vitals reviewed.    ED Treatments / Results  Labs (all labs ordered are listed, but only abnormal results are displayed) Labs Reviewed  COMPREHENSIVE METABOLIC PANEL - Abnormal; Notable for the following:       Result Value   CO2 21 (*)    Glucose, Bld 156 (*)    BUN 21 (*)    Creatinine, Ser 1.07 (*)    AST 43 (*)    GFR calc non Af Amer 52 (*)    GFR calc Af Amer 60 (*)    All other components within normal limits  CBC - Abnormal; Notable for the following:    Hemoglobin 11.4 (*)    All other components within normal limits  CBG MONITORING, ED - Abnormal;  Notable for the following:    Glucose-Capillary 158 (*)    All other components within normal limits  CK  MAGNESIUM    EKG  EKG Interpretation None       Radiology No results found.  Procedures Procedures (including critical care time)  Medications Ordered in ED Medications - No data to display   Initial Impression / Assessment and Plan / ED Course  I have reviewed the triage vital signs and the nursing notes.  Pertinent labs & imaging results that were available during my care of the patient were reviewed by me and considered in my medical decision making (see chart for details).     Patient presented with unexplained muscle spasm this afternoon. She has had an episode in the past and wasn't as severe and is currently on baclofen but only was able to take her morning dose today.  Patient improved and hasn't had any other episodes all in the emergency department. She was ambulatory in the emergency department. She walked from the wheelchair to get to the bed.  Patient was discussed with Dr. Darl Householder who has seen patient and recommendeds discharge with rectal diazepam as needed if unable to take baclofen orally during severe spasm.  Patient was comfortable and well-appearing, stable prior to discharge. Discharge home with close PCP follow-up  Discussed strict return precautions and advised to return to the emergency department if experiencing any new or worsening symptoms. Instructions were understood and patient agreed  with discharge plan.  Final Clinical Impressions(s) / ED Diagnoses   Final diagnoses:  Muscle spasm    New Prescriptions New Prescriptions   DIAZEPAM (DIASTAT ACUDIAL) 10 MG GEL    Place 5 mg rectally once as needed (cramping).     Emeline General, PA-C 02/14/17 0030    Drenda Freeze, MD 02/15/17 [phone]

## 2017-02-13 NOTE — ED Notes (Signed)
ED Provider at bedside. 

## 2017-02-13 NOTE — ED Notes (Signed)
EMS reports CBG 195, however, CBG was 45 at home earlier today.

## 2017-02-14 ENCOUNTER — Telehealth: Payer: Self-pay | Admitting: Neurology

## 2017-02-14 NOTE — Telephone Encounter (Signed)
Caller: Priya   Urgent? No  Reason for the call: Patient called to update Dr. Posey Pronto on her medication Nortriptyline 10 MG. She said since Dr. Posey Pronto decreased her from taking 2 tablets to now 1 she is doing better. She still has pain with her legs, arms and feet. That has seemed to increase, but the dry mouth, dry eyes and constipation is doing better. Thanks

## 2017-02-14 NOTE — Discharge Instructions (Signed)
As dicussed with Dr. Darl Householder, take your baclofen for muscle spasm. Use the rectal medicine if you experience muscle spasm that prevent you from taking medication orally.  Follow up with your primary care provider. Return if you experience seizure activity, difficulty breathing, chest pain, shortness of breath, or new concerning symptoms in the meantime.

## 2017-02-15 ENCOUNTER — Telehealth: Payer: Self-pay | Admitting: Endocrinology

## 2017-02-15 MED ORDER — BACLOFEN 20 MG PO TABS
20.0000 mg | ORAL_TABLET | Freq: Three times a day (TID) | ORAL | 2 refills | Status: DC
Start: 2017-02-15 — End: 2017-02-23

## 2017-02-15 NOTE — Telephone Encounter (Signed)
**  Remind patient they can make refill requests via MyChart**  Medication refill request (Name & Dosage) baclofen (LIORESAL) 10 MG tablet INCREASED DOSAGE  Preferred pharmacy (Name & Address):  [facility] - , [place] [phone] (Phone) [phone] (Fax)   Other comments (if applicable):   Patient went to the ER this week and the hospital is requesting that there be an increase in the dosage due to having cramps. Call patient to advise and ask for Darryl (son).

## 2017-02-15 NOTE — Telephone Encounter (Signed)
Patient notified of message from MD on the increase. She voiced understanding and had no further questions at this time.

## 2017-02-15 NOTE — Telephone Encounter (Signed)
Please advise if the request for the increased dosage is appropriate.

## 2017-02-15 NOTE — Telephone Encounter (Signed)
Send prescription for 20 mg 3 times a day

## 2017-02-15 NOTE — Telephone Encounter (Signed)
Will inform Dr. Posey Pronto.

## 2017-02-22 ENCOUNTER — Other Ambulatory Visit: Payer: Self-pay | Admitting: Neurology

## 2017-02-22 MED ORDER — NAPROXEN 500 MG PO TABS: 500.0000 mg | ORAL_TABLET | Freq: Every day | ORAL | 0 refills | Status: DC | PRN

## 2017-02-23 ENCOUNTER — Emergency Department (HOSPITAL_COMMUNITY): Payer: Medicare Other

## 2017-02-23 ENCOUNTER — Telehealth: Payer: Self-pay

## 2017-02-23 ENCOUNTER — Telehealth: Payer: Self-pay | Admitting: Endocrinology

## 2017-02-23 ENCOUNTER — Encounter (HOSPITAL_COMMUNITY): Payer: Self-pay | Admitting: *Deleted

## 2017-02-23 ENCOUNTER — Emergency Department (HOSPITAL_COMMUNITY)
Admission: EM | Admit: 2017-02-23 | Discharge: 2017-02-23 | Disposition: A | Discharge status: Home / Self Care | Payer: Medicare Other | Attending: Emergency Medicine | Admitting: Emergency Medicine

## 2017-02-23 DIAGNOSIS — R110 Nausea: Secondary | ICD-10-CM | POA: Insufficient documentation

## 2017-02-23 DIAGNOSIS — E119 Type 2 diabetes mellitus without complications: Secondary | ICD-10-CM | POA: Diagnosis not present

## 2017-02-23 DIAGNOSIS — R072 Precordial pain: Secondary | ICD-10-CM | POA: Diagnosis not present

## 2017-02-23 DIAGNOSIS — Z79899 Other long term (current) drug therapy: Secondary | ICD-10-CM | POA: Diagnosis not present

## 2017-02-23 DIAGNOSIS — Z7984 Long term (current) use of oral hypoglycemic drugs: Secondary | ICD-10-CM | POA: Insufficient documentation

## 2017-02-23 DIAGNOSIS — T428X5A Adverse effect of antiparkinsonism drugs and other central muscle-tone depressants, initial encounter: Secondary | ICD-10-CM | POA: Diagnosis not present

## 2017-02-23 DIAGNOSIS — T50905A Adverse effect of unspecified drugs, medicaments and biological substances, initial encounter: Secondary | ICD-10-CM | POA: Diagnosis not present

## 2017-02-23 DIAGNOSIS — R42 Dizziness and giddiness: Secondary | ICD-10-CM | POA: Insufficient documentation

## 2017-02-23 DIAGNOSIS — R0789 Other chest pain: Secondary | ICD-10-CM | POA: Diagnosis not present

## 2017-02-23 DIAGNOSIS — Z853 Personal history of malignant neoplasm of breast: Secondary | ICD-10-CM | POA: Diagnosis not present

## 2017-02-23 DIAGNOSIS — T887XXA Unspecified adverse effect of drug or medicament, initial encounter: Secondary | ICD-10-CM

## 2017-02-23 DIAGNOSIS — R51 Headache: Secondary | ICD-10-CM | POA: Diagnosis present

## 2017-02-23 DIAGNOSIS — R079 Chest pain, unspecified: Secondary | ICD-10-CM

## 2017-02-23 DIAGNOSIS — Z87891 Personal history of nicotine dependence: Secondary | ICD-10-CM | POA: Insufficient documentation

## 2017-02-23 DIAGNOSIS — R0602 Shortness of breath: Secondary | ICD-10-CM | POA: Insufficient documentation

## 2017-02-23 DIAGNOSIS — R531 Weakness: Secondary | ICD-10-CM | POA: Insufficient documentation

## 2017-02-23 DIAGNOSIS — I10 Essential (primary) hypertension: Secondary | ICD-10-CM | POA: Insufficient documentation

## 2017-02-23 LAB — CBC WITH DIFFERENTIAL/PLATELET
BASOS PCT: 1 %
Basophils Absolute: 0 10*3/uL (ref 0.0–0.1)
Eosinophils Absolute: 0 10*3/uL (ref 0.0–0.7)
Eosinophils Relative: 0 %
HEMATOCRIT: 40.9 % (ref 36.0–46.0)
Hemoglobin: 13.9 g/dL (ref 12.0–15.0)
LYMPHS PCT: 32 %
Lymphs Abs: 1.8 10*3/uL (ref 0.7–4.0)
MCH: 28.2 pg (ref 26.0–34.0)
MCHC: 34 g/dL (ref 30.0–36.0)
MCV: 83 fL (ref 78.0–100.0)
MONO ABS: 0.3 10*3/uL (ref 0.1–1.0)
MONOS PCT: 4 %
NEUTROS ABS: 3.6 10*3/uL (ref 1.7–7.7)
Neutrophils Relative %: 63 %
Platelets: 211 10*3/uL (ref 150–400)
RBC: 4.93 MIL/uL (ref 3.87–5.11)
RDW: 14.5 % (ref 11.5–15.5)
WBC: 5.7 10*3/uL (ref 4.0–10.5)

## 2017-02-23 LAB — URINALYSIS, ROUTINE W REFLEX MICROSCOPIC
Bacteria, UA: NONE SEEN
Bilirubin Urine: NEGATIVE
GLUCOSE, UA: NEGATIVE mg/dL
KETONES UR: NEGATIVE mg/dL
LEUKOCYTES UA: NEGATIVE
Nitrite: NEGATIVE
PH: 8 (ref 5.0–8.0)
PROTEIN: NEGATIVE mg/dL
Specific Gravity, Urine: 1.004 — ABNORMAL LOW (ref 1.005–1.030)

## 2017-02-23 LAB — COMPREHENSIVE METABOLIC PANEL
ALT: 54 U/L (ref 14–54)
ANION GAP: 12 (ref 5–15)
AST: 40 U/L (ref 15–41)
Albumin: 4.1 g/dL (ref 3.5–5.0)
Alkaline Phosphatase: 98 U/L (ref 38–126)
BILIRUBIN TOTAL: 0.5 mg/dL (ref 0.3–1.2)
BUN: 16 mg/dL (ref 6–20)
CO2: 26 mmol/L (ref 22–32)
Calcium: 9.9 mg/dL (ref 8.9–10.3)
Chloride: 100 mmol/L — ABNORMAL LOW (ref 101–111)
Creatinine, Ser: 0.87 mg/dL (ref 0.44–1.00)
GFR calc Af Amer: >60 mL/min (ref >60)
Glucose, Bld: 109 mg/dL — ABNORMAL HIGH (ref 65–99)
POTASSIUM: 4 mmol/L (ref 3.5–5.1)
Sodium: 138 mmol/L (ref 135–145)
TOTAL PROTEIN: 8 g/dL (ref 6.5–8.1)

## 2017-02-23 LAB — PROTIME-INR
INR: 3.31
Prothrombin Time: 34.4 seconds — ABNORMAL HIGH (ref 11.4–15.2)

## 2017-02-23 LAB — I-STAT TROPONIN, ED: Troponin i, poc: 0 ng/mL (ref 0.00–0.08)

## 2017-02-23 MED ORDER — BACLOFEN 10 MG PO TABS
10.0000 mg | ORAL_TABLET | Freq: Once | ORAL | Status: AC
  Administered 2017-02-23: 10 mg via ORAL
  Filled 2017-02-23: qty 1

## 2017-02-23 MED ORDER — BACLOFEN 10 MG PO TABS: 10.0000 mg | ORAL_TABLET | Freq: Two times a day (BID) | ORAL | 0 refills | Status: DC

## 2017-02-23 MED ORDER — BACLOFEN 20 MG PO TABS: 20.0000 mg | ORAL_TABLET | Freq: Every day | ORAL | 2 refills | Status: DC

## 2017-02-23 MED ORDER — SODIUM CHLORIDE 0.9 % IV BOLUS (SEPSIS)
500.0000 mL | Freq: Once | INTRAVENOUS | Status: AC
  Administered 2017-02-23: 500 mL via INTRAVENOUS

## 2017-02-23 NOTE — ED Notes (Signed)
Pt reports feeling much better, ambulatory in the room without difficulty.

## 2017-02-23 NOTE — Telephone Encounter (Signed)
Called and LVM advising of MD note to decrease med, and to see neuro. Left call back number if any questions.

## 2017-02-23 NOTE — Telephone Encounter (Signed)
Called patient and left a voice message to call us back with more information regarding symptoms.  Please see message and advise if you can.

## 2017-02-23 NOTE — ED Triage Notes (Signed)
Per EMS, pt from home, pt reports taking Baclofen last night at 2130, reports having a possible reaction from it.  C/o weakness, dizziness, and h/a x 2 days.

## 2017-02-23 NOTE — Telephone Encounter (Signed)
Caller states doctor increased her Baclofen for muscle spasms, she now takes 20 mg and is having some pain an weakness all over. She called the [facility]

## 2017-02-23 NOTE — ED Provider Notes (Signed)
[facility] DEPT Provider Note   CSN: [id number] Arrival date & time: 02/23/17  0909     History   Chief Complaint Chief Complaint  Patient presents with  . Allergic Reaction    HPI Kelli Martinez is a 71 y.o. female.  HPI Reports 2 days of fatigue, with gradual onset headache and has been persistent for 2 days after increased of Baclofen last week for diffused cramping.  Endorses substernal chest pain yesterday, intermittent, nonexertional, with associated SOB and nausea. No recent fevers, URI sx, abd pain, dysuria. Does report urinary frequency while on spironolactone. No alleviating or aggravating fractors. No other physical complaints.    Past Medical History:  Diagnosis Date  . Allergy   . Anemia   . Arthritis   . Asthma   . Back pain, chronic    GETS INJECTIONS IN BACK  . Blind right eye    hemorrhage  . Blood transfusion   . Cancer ([place])    breast 1994  . Clotting disorder ([place])   . Diabetes mellitus without complication ([place])    takes precose  . Elevated liver enzymes   . Gallstones   . GERD (gastroesophageal reflux disease)   . HX: breast cancer   . Hyperlipidemia   . Hypertension   . Hypothyroid   . Lymphedema of arm    RT  . Morbid obesity ([place])   . OSA (obstructive sleep apnea)    has not used in 2 years-lost weight  . Osteoporosis   . Peripheral neuropathy    on gabapentin  . PONV (postoperative nausea and vomiting)   . Psoriasis   . Pulmonary embolism ([place]) 1998 / 1994 /1968  . Syncope and collapse 2011   due to anemia    Patient Active Problem List   Diagnosis Date Noted  . Encounter for therapeutic drug monitoring 08/26/2013  . Insomnia 05/01/2013  . Long term (current) use of anticoagulants 04/15/2013  . Postsurgical dumping syndrome 04/06/2013  . Bilateral leg edema 04/06/2013  . Reactive hypoglycemia 04/02/2013  . Abdominal pain intermittent-evalulating for internal hernia 11/07/2012  . S/P laparoscopic cholecystectomy  05/31/2012  . Roux Y Gastric Bypass April 2011; repair [facility] 11/22/2011  . Unspecified deficiency anemia 11/14/2011  . Left leg paresthesias 10/27/2010  . Hypothyroid 10/27/2010  . Low back pain 10/27/2010  . Obstructive sleep apnea 09/29/2007  . PULMONARY EMBOLISM 09/25/2007  . Seasonal and perennial allergic rhinitis 09/25/2007  . Allergic asthma, mild persistent, uncomplicated 49/67/5916  . BREAST CANCER, HX OF 09/25/2007    Past Surgical History:  Procedure Laterality Date  . ABDOMINAL HYSTERECTOMY    . BONE MARROW TRANSPLANT  1994  . CARDIOVASCULAR STRESS TEST  05/23/2005   EF 53%  . carpal tunnell     bil  . cataracts    . CHOLECYSTECTOMY  05/10/2012   Procedure: LAPAROSCOPIC CHOLECYSTECTOMY WITH INTRAOPERATIVE CHOLANGIOGRAM;  Surgeon: Pedro Earls, MD;  Location: [facility];  Service: General;  Laterality: N/A;  Laparoscopic Cholecystectomy with Intraoperative Cholangiogram  . GASTRIC BYPASS  2011   bariatric surgery  . HIATAL HERNIA REPAIR    . IVC filter     recurrent DVT  . KNEE ARTHROTOMY  1998  . MASS EXCISION Right 11/24/2016   Procedure: EXCISION RIGHT AXILLARY LESION AND CHEST WALL LESION;  Surgeon: Johnathan Hausen, MD;  Location: [facility];  Service: General;  Laterality: Right;  . MASTECTOMY     Right  . US ECHOCARDIOGRAPHY  10/27/08   EF 55-60%  OB History    No data available       Home Medications    Prior to Admission medications   Medication Sig Start Date End Date Taking? Authorizing Provider  acarbose (PRECOSE) 25 MG tablet TAKE 1 TABLET THREE TIMES A DAY 12/18/16  Yes Elayne Snare, MD  albuterol [facility] HFA) 108 (90 Base) MCG/ACT inhaler USE 2 INHALATIONS FOUR TIMES A DAY AS NEEDED ( AS NEEDED RESCUE INHALER) 08/31/16  Yes Baird Lyons D, MD  atorvastatin (LIPITOR) 20 MG tablet TAKE 1 TABLET DAILY Patient taking differently: QOD 12/01/16  Yes Elayne Snare, MD  Blood Glucose Monitoring Suppl (FREESTYLE FREEDOM LITE) W/DEVICE KIT use  as directed 04/22/15  Yes Elayne Snare, MD  Blood Glucose Monitoring Suppl (FREESTYLE LITE) DEVI Use to check blood sugar once a day DX code E11.65 04/12/15  Yes Elayne Snare, MD  clobetasol cream (TEMOVATE) 6.19 % Apply 1 application topically 2 (two) times daily.   Yes [provider]  ferrous sulfate 325 (65 FE) MG tablet Take 325 mg by mouth daily.     Yes [provider]  fluticasone (FLONASE) 50 MCG/ACT nasal spray Place 2 sprays into both nostrils daily. 08/31/16  Yes Baird Lyons D, MD  Fluticasone-Salmeterol (ADVAIR DISKUS) 100-50 MCG/DOSE AEPB USE 1 INHALATION EVERY 12 HOURS, RINSE MOUTH 08/31/16  Yes Young, Clinton D, MD  FREESTYLE LITE test strip USE AS INSTRUCTED TO CHECK BLOOD SUGAR ONCE DAILY 11/20/16  Yes Elayne Snare, MD  furosemide (LASIX) 40 MG tablet TAKE 1 TABLET EVERY MORNING Patient taking differently: TAKE 1 TABLET EVERY MORNING PRN 12/25/16  Yes Elayne Snare, MD  gabapentin (NEURONTIN) 300 MG capsule Take 2 capsules (600 mg total) by mouth 3 (three) times daily. 01/22/17  Yes Patel, Donika K, DO  Lancets (FREESTYLE) lancets USE AS INSTRUCTED TO CHECK BLOOD SUGAR ONCE DAILY 11/20/16  Yes Elayne Snare, MD  levothyroxine (SYNTHROID, LEVOTHROID) 125 MCG tablet TAKE 1 TABLET DAILY BEFORE BREAKFAST 11/13/16  Yes Elayne Snare, MD  magnesium chloride (SLOW-MAG) 64 MG TBEC SR tablet Take 1 tablet (64 mg total) by mouth 2 (two) times daily. 07/21/13  Yes Elayne Snare, MD  metFORMIN (GLUCOPHAGE) 500 MG tablet Take 1 tablet (500 mg total) by mouth at bedtime. 01/12/17  Yes Elayne Snare, MD  methylcellulose (ARTIFICIAL TEARS) 1 % ophthalmic solution Place 1 drop into both eyes as needed. Dry eyes   Yes [provider]  nortriptyline (PAMELOR) 10 MG capsule Start nortriptyline 95m at bedtime for 2 weeks, then increase to 2 tablet at bedtime 10/09/16  Yes Patel, Donika K, DO  omeprazole (PRILOSEC) 20 MG capsule TAKE 1 CAPSULE DAILY 01/15/17  Yes KElayne Snare MD  spironolactone  (ALDACTONE) 50 MG tablet TAKE 1 TABLET TWICE A DAY 10/17/16  Yes KElayne Snare MD  vitamin B-12 (CYANOCOBALAMIN) 500 MCG tablet Take 500 mcg by mouth daily.     Yes [provider]  Vitamin D, Ergocalciferol, (DRISDOL) 50000 units CAPS capsule TAKE 1 CAPSULE EVERY FRIDAY 01/26/17  Yes KElayne Snare MD  warfarin (COUMADIN) 5 MG tablet TAKE 2 TABLETS ONCE DAILY (THIS IS TO REPLACE THE 10 MG TABLET) Patient taking differently: 2 tabs (Sun, Tues, Wed, [place] Sat) 3 tabs (Mon, Fri) 10/09/16  Yes KElayne Snare MD  baclofen (LIORESAL) 10 MG tablet Take 1 tablet (10 mg total) by mouth 2 (two) times daily. In the morning and afternoon. 02/23/17   CFatima Blank MD  baclofen (LIORESAL) 20 MG tablet Take 1 tablet (20 mg total)  by mouth at bedtime. 02/23/17   Fatima Blank, MD  diazepam (DIASTAT ACUDIAL) 10 MG GEL Place 5 mg rectally once as needed (cramping). Patient not taking: Reported on 02/23/2017 02/13/17   Drenda Freeze, MD  HYDROcodone-acetaminophen [facility]) 5-325 MG tablet Take 1 tablet by mouth every 4 (four) hours as needed for moderate pain. Patient not taking: Reported on 02/23/2017 11/24/16   Johnathan Hausen, MD  hydrOXYzine (ATARAX/VISTARIL) 25 MG tablet Take 1 tablet (25 mg total) by mouth 3 (three) times daily as needed. Patient not taking: Reported on 02/23/2017 01/27/16   Elayne Snare, MD  naproxen (NAPROSYN) 500 MG tablet Take 1 tablet (500 mg total) by mouth daily as needed. Patient not taking: Reported on 02/23/2017 02/22/17   Narda Amber K, DO  NON FORMULARY CPAP 6 [employer]    [provider]  temazepam (RESTORIL) 15 MG capsule 1-2 for sleep as needed Patient not taking: Reported on 02/23/2017 11/23/16   Baird Lyons D, MD  triamcinolone cream (KENALOG) 0.1 % Apply 1 application topically 2 (two) times daily. 01/28/14   Elayne Snare, MD    Family History Family History  Problem Relation Age of Onset  . Sudden death Mother        car accident  . Cancer Father  61       lung  . Cancer Sister 29       lung cancer  . Asthma Daughter     Social History Social History  Substance Use Topics  . Smoking status: Former Smoker    Packs/day: 0.50    Years: 10.00    Types: Cigarettes    Quit date: 07/31/1978  . Smokeless tobacco: Never Used  . Alcohol use No     Allergies   Adhesive [tape]   Review of Systems Review of Systems All other systems are reviewed and are negative for acute change except as noted in the HPI   Physical Exam Updated Vital Signs BP 119/72 (BP Location: Right Arm)   Pulse 65   Temp 97.9 F (36.6 C) (Oral)   Resp 18   SpO2 100%   Physical Exam  Constitutional: She is oriented to person, place, and time. She appears well-developed and well-nourished. No distress.  HENT:  Head: Normocephalic and atraumatic.  Nose: Nose normal.  Eyes: Pupils are equal, round, and reactive to light. Conjunctivae and EOM are normal. Right eye exhibits no discharge. Left eye exhibits no discharge. No scleral icterus.  Neck: Normal range of motion. Neck supple.  Cardiovascular: Normal rate and regular rhythm.  Exam reveals no gallop and no friction rub.   No murmur heard. Pulmonary/Chest: Effort normal and breath sounds normal. No stridor. No respiratory distress. She has no rales.  S/p bilateral mastectomy  Abdominal: Soft. She exhibits no distension. There is no tenderness.  Musculoskeletal: She exhibits no edema or tenderness.  Neurological: She is alert and oriented to person, place, and time.  Skin: Skin is warm and dry. No rash noted. She is not diaphoretic. No erythema.  Psychiatric: She has a normal mood and affect.  Vitals reviewed.    ED Treatments / Results  Labs (all labs ordered are listed, but only abnormal results are displayed) Labs Reviewed  COMPREHENSIVE METABOLIC PANEL - Abnormal; Notable for the following:       Result Value   Chloride 100 (*)    Glucose, Bld 109 (*)    All other components within  normal limits  PROTIME-INR - Abnormal; Notable for the  following:    Prothrombin Time 34.4 (*)    All other components within normal limits  URINALYSIS, ROUTINE W REFLEX MICROSCOPIC - Abnormal; Notable for the following:    Color, Urine STRAW (*)    Specific Gravity, Urine 1.004 (*)    Hgb urine dipstick SMALL (*)    Squamous Epithelial / LPF 0-5 (*)    All other components within normal limits  CBC WITH DIFFERENTIAL/PLATELET  I-STAT TROPONIN, ED    EKG  EKG Interpretation  Date/Time:  Friday February 23 2017 10:56:06 EDT Ventricular Rate:  56 PR Interval:    QRS Duration: 93 QT Interval:  455 QTC Calculation: 440 R Axis:   73 Text Interpretation:  Age not entered, assumed to be  71 years old for purpose of ECG interpretation Sinus rhythm Prolonged PR interval Baseline wander in lead(s) V1 No significant change since last tracing Confirmed by Addison Lank [phone]) on 02/23/2017 11:56:11 AM       Radiology Dg Chest 2 View  Result Date: 02/23/2017 CLINICAL DATA:  Hypertension.  Diabetes.  Extremity edema. EXAM: CHEST  2 VIEW COMPARISON:  CT 11/05/2014.  Chest x-ray 05/07/2012 . FINDINGS: Mediastinum hilar structures normal. Lungs are clear. Heart size normal. Surgical clips right axilla. Soft tissue calcifications right axilla, most likely dystrophic calcifications . Prior right mastectomy. No acute bony abnormality. Degenerative changes thoracic spine. IMPRESSION: No acute cardiopulmonary disease.  Right mastectomy. Electronically Signed   By: Marcello Moores  Register   On: 02/23/2017 11:46    Procedures Procedures (including critical care time)  Medications Ordered in ED Medications  sodium chloride 0.9 % bolus 500 mL (500 mLs Intravenous New Bag/Given 02/23/17 1112)  baclofen (LIORESAL) tablet 10 mg (10 mg Oral Given 02/23/17 1059)     Initial Impression / Assessment and Plan / ED Course  I have reviewed the triage vital signs and the nursing notes.  Pertinent labs & imaging  results that were available during my care of the patient were reviewed by me and considered in my medical decision making (see chart for details).     1. Fatigue Appears to be side effect from increased her baclofen with likely superimposed dehydration given the fact that the patient has had frequency from spironolactone use. No infectious symptoms. Labs grossly reassuring without evidence of anemia, renal insufficiency, electrolyte derangements. Patient provided with IV fluids and had significant improvement in her symptomatology. Able to ambulate without complication.  Spoke with Dr. Dwyane Dee, the patient's PCP, who agreed with changing the dosing of her baclofen to 10 mg in the morning and afternoon and 20 mg in the evening. He requested the patient follow-up with her neurologist Dr. Posey Pronto.  2. Atypical chest pain highly consistent with ACS. EKG without acute ischemic changes or evidence of pericarditis. Troponin negative. Pelvis appropriate to rule out ACS, given the timeline of her chest pain. Patient is supratherapeutic on her INR some low suspicion for pulmonary embolism especially given the fact that she is asymptomatic at this time. Presentation is classic for aortic dissection or esophageal perforation. Chest x-ray without evidence suggestive of pneumonia, pneumothorax, pneumomediastinum.  No abnormal contour of the mediastinum to suggest dissection. No evidence of acute injuries.    Final Clinical Impressions(s) / ED Diagnoses   Final diagnoses:  Chest pain  Medication side effect   Disposition: Discharge  Condition: Good  I have discussed the results, Dx and Tx plan with the patient who expressed understanding and agree(s) with the plan. Discharge instructions discussed at great length.  The patient was given strict return precautions who verbalized understanding of the instructions. No further questions at time of discharge.    New Prescriptions   BACLOFEN (LIORESAL) 10 MG TABLET     Take 1 tablet (10 mg total) by mouth 2 (two) times daily. In the morning and afternoon.    Follow Up: Elayne Snare, MD [address] [place] [address] [phone]  Schedule an appointment as soon as possible for a visit  As needed  Dr. Posey Pronto  Schedule an appointment as soon as possible for a visit  in 5-7 days      Cardama, Grayce Sessions, MD 02/23/17 [phone]

## 2017-02-23 NOTE — Telephone Encounter (Signed)
She needs to go back to the lower dose of baclofen.  If she has seen a neurologist she needs to go back for follow-up evaluation of her symptoms

## 2017-02-26 ENCOUNTER — Telehealth: Payer: Self-pay | Admitting: *Deleted

## 2017-02-26 NOTE — Telephone Encounter (Signed)
From what I can see in the message the patient called here on Friday and was informed to call 911 and go the the ER. I am not sure if she needs anything at this moment. Just wanted to send to you to see if anything else is needed.

## 2017-02-26 NOTE — Telephone Encounter (Signed)
I had discussed with the emergency room doctor.  This is unclear message: Is the patient calling?

## 2017-02-26 NOTE — Telephone Encounter (Signed)
Please advise. There is also a note from Friday.

## 2017-02-26 NOTE — Telephone Encounter (Signed)
PLEASE NOTE: All timestamps contained within this report are represented as [place] Standard Time. CONFIDENTIALTY NOTICE: This fax transmission is intended only for the addressee. It contains information that is legally privileged, confidential or otherwise protected from use or disclosure. If you are not the intended recipient, you are strictly prohibited from reviewing, disclosing, copying using or disseminating any of this information or taking any action in reliance on or regarding this information. If you have received this fax in error, please notify us immediately by telephone so that we can arrange for its return to [place]. Phone: [phone], Toll-Free: [phone], Fax: [phone] Page: 1 of 2 Call Id: [id number] [facility] Patient Name: Kelli Martinez Gender: Female DOB: 1946/05/19 Age: 71 Y 2 M 15 D Return Phone Number: [phone] (Primary), [phone] (Secondary) City/State/Zip: [place] [address] Client [facility] Client Site [facility] [place] Night Who Is Calling Patient / Member / Family / Caregiver Call Type Triage / Clinical Caller Name Darryl File Relationship To Patient Son Return Phone Number [phone] (Primary) Chief Complaint Medication reaction Reason for Call Symptomatic / Request for Health Information Initial Comment mother on med., dr just increased dose, now has pain all over body, weak all over called dr. Dwyane Dee and transferred to [place] Nurse Assessment Nurse: Cherie Dark, RN, Jar[facility] Soho Date/Time (Eastern Time): 02/23/2017 7:37:53 AM Confirm and document reason for call. If symptomatic, describe symptoms. ---Caller states her doctor increased her Baclofen for muscle spasms. She now takes 20mg  and is having some pain and weakness all over. Does the PT have any chronic conditions? (i.e.  diabetes, asthma, etc.) ---Yes List chronic conditions. ---muscle spasms, back and leg issues, Guidelines Guideline Title Affirmed Question Weakness (Generalized) and Fatigue [1] SEVERE weakness (i.e., unable to walk or barely able to walk, requires support) AND [2] new onset or worsening Disp. Time Eilene Ghazi Time) Disposition Final User 02/23/2017 8:05:42 AM Call EMS 911 Now Yes Cherie Dark, RN, [facility] - ED Care Advice Given Per Guideline CALL EMS 911 NOW: Immediate medical attention is needed. You need to hang up and call 911 (or an ambulance). Psychologist, forensic Discretion: I'll call you back in a few minutes to be sure you were able to reach them.) BRING MEDICINES: CARE ADVICE given per Weakness and Fatigue (Adult) guideline. * Please bring a list of your current medicines when you go to the Emergency Department (ER). Comments User: Creig Hines, RN Date/Time Eilene Ghazi Time): 02/23/2017 7:48:28 AM PLEASE NOTE: All timestamps contained within this report are represented as [place] Standard Time. CONFIDENTIALTY NOTICE: This fax transmission is intended only for the addressee. It contains information that is legally privileged, confidential or otherwise protected from use or disclosure. If you are not the intended recipient, you are strictly prohibited from reviewing, disclosing, copying using or disseminating any of this information or taking any action in reliance on or regarding this information. If you have received this fax in error, please notify us immediately by telephone so that we can arrange for its return to [place]. Phone: [phone], Toll-Free: [phone], Fax: [phone] Page: 2 of 2 Call Id: [id number] Comments thyroid issues, takes warfarin User: Creig Hines, RN Date/Time Eilene Ghazi Time): 02/23/2017 7:56:16 AM diabetes

## 2017-02-27 DIAGNOSIS — I739 Peripheral vascular disease, unspecified: Secondary | ICD-10-CM | POA: Diagnosis not present

## 2017-02-27 DIAGNOSIS — L603 Nail dystrophy: Secondary | ICD-10-CM | POA: Diagnosis not present

## 2017-02-27 DIAGNOSIS — G5762 Lesion of plantar nerve, left lower limb: Secondary | ICD-10-CM | POA: Diagnosis not present

## 2017-02-27 DIAGNOSIS — L84 Corns and callosities: Secondary | ICD-10-CM | POA: Diagnosis not present

## 2017-02-27 DIAGNOSIS — M79672 Pain in left foot: Secondary | ICD-10-CM | POA: Diagnosis not present

## 2017-02-27 DIAGNOSIS — E1151 Type 2 diabetes mellitus with diabetic peripheral angiopathy without gangrene: Secondary | ICD-10-CM | POA: Diagnosis not present

## 2017-02-27 DIAGNOSIS — M65872 Other synovitis and tenosynovitis, left ankle and foot: Secondary | ICD-10-CM | POA: Diagnosis not present

## 2017-03-06 ENCOUNTER — Telehealth: Payer: Self-pay | Admitting: Neurology

## 2017-03-06 NOTE — Telephone Encounter (Signed)
Noted  

## 2017-03-06 NOTE — Telephone Encounter (Signed)
PT called and cancelled her EMG on the 16th of August but she said she was in the hospital and wanted a call back

## 2017-03-06 NOTE — Telephone Encounter (Signed)
Patient cancelled the EMG but still wants to come see you in December.  She wanted to let you know that she has been in the hospital.  She will try to do her PT tomorrow but may stop for a little while until she is stronger.  JUST FYI.

## 2017-03-08 NOTE — Patient Instructions (Signed)
Pre visit review using our clinic review tool, if applicable. No additional management support is needed unless otherwise documented below in the visit note. 

## 2017-03-09 ENCOUNTER — Ambulatory Visit (INDEPENDENT_AMBULATORY_CARE_PROVIDER_SITE_OTHER): Payer: Medicare Other | Admitting: General Practice

## 2017-03-09 DIAGNOSIS — Z5181 Encounter for therapeutic drug level monitoring: Secondary | ICD-10-CM | POA: Diagnosis not present

## 2017-03-09 LAB — POCT INR: INR: 2.6

## 2017-03-09 NOTE — Progress Notes (Signed)
I have reviewed and agree with the plan. 

## 2017-03-12 ENCOUNTER — Other Ambulatory Visit (INDEPENDENT_AMBULATORY_CARE_PROVIDER_SITE_OTHER): Payer: Medicare Other

## 2017-03-12 DIAGNOSIS — E1142 Type 2 diabetes mellitus with diabetic polyneuropathy: Secondary | ICD-10-CM

## 2017-03-12 LAB — COMPREHENSIVE METABOLIC PANEL
ALBUMIN: 3.8 g/dL (ref 3.5–5.2)
ALT: 49 U/L — AB (ref 0–35)
AST: 32 U/L (ref 0–37)
Alkaline Phosphatase: 75 U/L (ref 39–117)
BILIRUBIN TOTAL: 0.4 mg/dL (ref 0.2–1.2)
BUN: 16 mg/dL (ref 6–23)
CALCIUM: 9.2 mg/dL (ref 8.4–10.5)
CO2: 30 meq/L (ref 19–32)
CREATININE: 0.77 mg/dL (ref 0.40–1.20)
Chloride: 102 mEq/L (ref 96–112)
GFR: 95.52 mL/min (ref >60.00)
Glucose, Bld: 122 mg/dL — ABNORMAL HIGH (ref 70–99)
Potassium: 3.8 mEq/L (ref 3.5–5.1)
Sodium: 139 mEq/L (ref 135–145)
Total Protein: 6.4 g/dL (ref 6.0–8.3)

## 2017-03-12 LAB — HEMOGLOBIN A1C: Hgb A1c MFr Bld: 6.9 % — ABNORMAL HIGH (ref 4.6–6.5)

## 2017-03-12 LAB — TSH: TSH: 2.2 u[IU]/mL (ref 0.35–4.50)

## 2017-03-15 ENCOUNTER — Encounter: Payer: Medicare Other | Admitting: Neurology

## 2017-03-16 ENCOUNTER — Other Ambulatory Visit: Payer: Self-pay

## 2017-03-16 ENCOUNTER — Encounter: Payer: Self-pay | Admitting: Endocrinology

## 2017-03-16 ENCOUNTER — Ambulatory Visit (INDEPENDENT_AMBULATORY_CARE_PROVIDER_SITE_OTHER): Payer: Medicare Other | Admitting: Endocrinology

## 2017-03-16 VITALS — BP 120/68 | HR 63 | Ht 68.0 in | Wt 218.4 lb

## 2017-03-16 DIAGNOSIS — M5440 Lumbago with sciatica, unspecified side: Secondary | ICD-10-CM

## 2017-03-16 DIAGNOSIS — K7689 Other specified diseases of liver: Secondary | ICD-10-CM

## 2017-03-16 DIAGNOSIS — E1142 Type 2 diabetes mellitus with diabetic polyneuropathy: Secondary | ICD-10-CM | POA: Diagnosis not present

## 2017-03-16 DIAGNOSIS — R609 Edema, unspecified: Secondary | ICD-10-CM | POA: Diagnosis not present

## 2017-03-16 DIAGNOSIS — E559 Vitamin D deficiency, unspecified: Secondary | ICD-10-CM | POA: Diagnosis not present

## 2017-03-16 DIAGNOSIS — K911 Postgastric surgery syndromes: Secondary | ICD-10-CM

## 2017-03-16 DIAGNOSIS — R945 Abnormal results of liver function studies: Secondary | ICD-10-CM

## 2017-03-16 MED ORDER — TRAMADOL HCL 50 MG PO TABS: 50.0000 mg | ORAL_TABLET | Freq: Two times a day (BID) | ORAL | 0 refills | Status: DC | PRN

## 2017-03-16 NOTE — Progress Notes (Signed)
Patient ID: Kelli Martinez, female   DOB: August 09, 1945, 71 y.o.   MRN: [id number]    Chief complaint: Followup of various issues   History of Present Illness:  The patient is seen for the following problems:  1. Postprandial hypoglycemia related to dumping syndrome   She has had postprandial hypoglycemia starting a couple of years after her gastric bypass surgery Her symptoms of blood sugars are mostly a feeling of significant weakness, some shakiness.  She will use fruits or juice in order to relieve the symptoms She has been to the dietitian and has been instructed on balanced low-fat meals with enough protein consistently and restricting carbohydrates including fruits   Recent history:  She has been treated with acarbose with improvement. She did try verapamil empirically but this did not help.  She is taking her Precose usually right before eating, 1 tablets at breakfast 1 at lunch and 2 at dinner On her last visit she was told to take 2 tablets at lunch but she forgot and is taking those in the evening  Recent blood sugars and management:  She has had a couple of low sugars late afternoon and the last one was related to her having relatively light meal  She is trying to get some protein at most meals, such as before breakfast she will have a boiled egg and fruit  Since her last visit she has lost weight Again she feels fairly good when her blood sugars are between 90-110 and she feels bad when they are either higher or lower   Current blood sugar monitor: Freestyle  Recent blood sugar range from download:   Overall range 56-1 83 Average blood sugar at any given time of the day ranges between 101 and 127 HIGHEST blood sugars are in the early mornings and some after lunch; has only a couple of relatively high readings, once after rebound from low sugar  Overall AVERAGE 112  Wt Readings from Last 3 Encounters:  03/16/17 218 lb 6.4 oz (99.1 kg)  01/22/17 225  lb 2 oz (102.1 kg)  01/12/17 226 lb 6.4 oz (102.7 kg)     2.  History of diabetes:   Overall blood sugars have been Reasonably controlled since her bariatric surgery without any medications. She is taking Precose mostly to prevent reactive hypoglycemia  Usually has a normal A1c readings around 6.2-6.6 However more recently her A1c has been 6.8-6.9 For this even when she was started on METFORMIN 500 mg at bedtime  Blood sugar patterns: See above Lab fasting glucose 122  Recently has  lost weight although recently has been usually regular with her water exercises Overall cutting back on carbohydrates    Lab Results  Component Value Date   HGBA1C 6.9 (H) 03/12/2017   HGBA1C 6.8 11/27/2016   HGBA1C 6.5 08/01/2016   Lab Results  Component Value Date   MICROALBUR <0.7 08/01/2016   LDLCALC 68 08/01/2016   CREATININE 0.77 03/12/2017      Other active problems: see review of systems      Allergies as of 03/16/2017      Reactions   Adhesive [tape] Rash      Medication List       Accurate as of 03/16/17 11:59 PM. Always use your most recent med list.          acarbose 25 MG tablet Commonly known as:  PRECOSE TAKE 1 TABLET THREE TIMES A DAY   albuterol 108 (90 Base) MCG/ACT inhaler Commonly  known as:  PROAIR HFA USE 2 INHALATIONS FOUR TIMES A DAY AS NEEDED ( AS NEEDED RESCUE INHALER)   atorvastatin 20 MG tablet Commonly known as:  LIPITOR TAKE 1 TABLET DAILY   baclofen 20 MG tablet Commonly known as:  LIORESAL Take 1 tablet (20 mg total) by mouth at bedtime.   baclofen 10 MG tablet Commonly known as:  LIORESAL Take 1 tablet (10 mg total) by mouth 2 (two) times daily. In the morning and afternoon.   CALTRATE 600+D 600-400 MG-UNIT tablet Generic drug:  Calcium Carbonate-Vitamin D Take 1 tablet by mouth daily.   clobetasol cream 0.05 % Commonly known as:  TEMOVATE Apply 1 application topically 2 (two) times daily.   diazepam 10 MG Gel Commonly known as:   DIASTAT ACUDIAL Place 5 mg rectally once as needed (cramping).   ferrous sulfate 325 (65 FE) MG tablet Take 325 mg by mouth daily.   fluticasone 50 MCG/ACT nasal spray Commonly known as:  FLONASE Place 2 sprays into both nostrils daily.   Fluticasone-Salmeterol 100-50 MCG/DOSE Aepb Commonly known as:  ADVAIR DISKUS USE 1 INHALATION EVERY 12 HOURS, RINSE MOUTH   freestyle lancets USE AS INSTRUCTED TO CHECK BLOOD SUGAR ONCE DAILY   FREESTYLE LITE Devi Use to check blood sugar once a day DX code E11.65   FREESTYLE FREEDOM LITE w/Device Kit use as directed   FREESTYLE LITE test strip Generic drug:  glucose blood USE AS INSTRUCTED TO CHECK BLOOD SUGAR ONCE DAILY   furosemide 40 MG tablet Commonly known as:  LASIX TAKE 1 TABLET EVERY MORNING   gabapentin 300 MG capsule Commonly known as:  NEURONTIN Take 2 capsules (600 mg total) by mouth 3 (three) times daily.   HYDROcodone-acetaminophen 5-325 MG tablet Commonly known as:  NORCO Take 1 tablet by mouth every 4 (four) hours as needed for moderate pain.   hydrOXYzine 25 MG tablet Commonly known as:  ATARAX/VISTARIL Take 1 tablet (25 mg total) by mouth 3 (three) times daily as needed.   levothyroxine 125 MCG tablet Commonly known as:  SYNTHROID, LEVOTHROID TAKE 1 TABLET DAILY BEFORE BREAKFAST   magnesium chloride 64 MG Tbec SR tablet Commonly known as:  SLOW-MAG Take 1 tablet (64 mg total) by mouth 2 (two) times daily.   metFORMIN 500 MG tablet Commonly known as:  GLUCOPHAGE Take 1 tablet (500 mg total) by mouth at bedtime.   methylcellulose 1 % ophthalmic solution Commonly known as:  ARTIFICIAL TEARS Place 1 drop into both eyes as needed. Dry eyes   naproxen 500 MG tablet Commonly known as:  NAPROSYN Take 1 tablet (500 mg total) by mouth daily as needed.   NON FORMULARY CPAP 6 [employer]   nortriptyline 10 MG capsule Commonly known as:  PAMELOR Start nortriptyline 25m at bedtime for 2 weeks, then increase to  2 tablet at bedtime   omeprazole 20 MG capsule Commonly known as:  PRILOSEC TAKE 1 CAPSULE DAILY   spironolactone 50 MG tablet Commonly known as:  ALDACTONE TAKE 1 TABLET TWICE A DAY   temazepam 15 MG capsule Commonly known as:  RESTORIL 1-2 for sleep as needed   traMADol 50 MG tablet Commonly known as:  ULTRAM Take 1 tablet (50 mg total) by mouth every 12 (twelve) hours as needed.   triamcinolone cream 0.1 % Commonly known as:  KENALOG Apply 1 application topically 2 (two) times daily.   vitamin B-12 500 MCG tablet Commonly known as:  CYANOCOBALAMIN Take 500 mcg by mouth daily.  Vitamin D (Ergocalciferol) 50000 units Caps capsule Commonly known as:  DRISDOL TAKE 1 CAPSULE EVERY FRIDAY   warfarin 5 MG tablet Commonly known as:  COUMADIN TAKE 2 TABLETS ONCE DAILY (THIS IS TO REPLACE THE 10 MG TABLET)       Allergies:  Allergies  Allergen Reactions  . Adhesive [Tape] Rash    Past Medical History:  Diagnosis Date  . Allergy   . Anemia   . Arthritis   . Asthma   . Back pain, chronic    GETS INJECTIONS IN BACK  . Blind right eye    hemorrhage  . Blood transfusion   . Cancer ([place])    breast 1994  . Clotting disorder ([place])   . Diabetes mellitus without complication ([place])    takes precose  . Elevated liver enzymes   . Gallstones   . GERD (gastroesophageal reflux disease)   . HX: breast cancer   . Hyperlipidemia   . Hypertension   . Hypothyroid   . Lymphedema of arm    RT  . Morbid obesity ([place])   . OSA (obstructive sleep apnea)    has not used in 2 years-lost weight  . Osteoporosis   . Peripheral neuropathy    on gabapentin  . PONV (postoperative nausea and vomiting)   . Psoriasis   . Pulmonary embolism ([place]) 1998 / 1994 /1968  . Syncope and collapse 2011   due to anemia    Past Surgical History:  Procedure Laterality Date  . ABDOMINAL HYSTERECTOMY    . BONE MARROW TRANSPLANT  1994  . CARDIOVASCULAR STRESS TEST  05/23/2005   EF 53%  .  carpal tunnell     bil  . cataracts    . CHOLECYSTECTOMY  05/10/2012   Procedure: LAPAROSCOPIC CHOLECYSTECTOMY WITH INTRAOPERATIVE CHOLANGIOGRAM;  Surgeon: Pedro Earls, MD;  Location: [facility];  Service: General;  Laterality: N/A;  Laparoscopic Cholecystectomy with Intraoperative Cholangiogram  . GASTRIC BYPASS  2011   bariatric surgery  . HIATAL HERNIA REPAIR    . IVC filter     recurrent DVT  . KNEE ARTHROTOMY  1998  . MASS EXCISION Right 11/24/2016   Procedure: EXCISION RIGHT AXILLARY LESION AND CHEST WALL LESION;  Surgeon: Johnathan Hausen, MD;  Location: [facility];  Service: General;  Laterality: Right;  . MASTECTOMY     Right  . US ECHOCARDIOGRAPHY  10/27/08   EF 55-60%    Family History  Problem Relation Age of Onset  . Sudden death Mother        car accident  . Cancer Father 2       lung  . Cancer Sister 67       lung cancer  . Asthma Daughter     Social History:  reports that she quit smoking about 38 years ago. Her smoking use included Cigarettes. She has a 5.00 pack-year smoking history. She has never used smokeless tobacco. She reports that she does not drink alcohol or use drugs.  Review of Systems   Neuropathy with burning and lower leg pain: Nortriptyline was reduced because of some blurring of vision  MUSCLE cramps/spasms: She is not doing well with taking 10 mg baclofen during the day and 20 mg at night this was changed on her last ER with carbohydrate   EDEMA: She has had problems with  edema both in her legs and right arm Has been on 50 mg bid of Aldactone   She has taken Lasix q od  recently with good control She has not been using the compression stockings as prescribed because of cost    Lymphedema right arm, secondary to  breast surgery, using  elastic compression stocking, needs another one   History of mild hypothyroidism: Adequately replaced  with 62.5 mcg ,  has consistently normal TSH   Lab Results  Component Value Date    TSH 2.20 03/12/2017    Hypercholesterolemia: LDL has been at target with Lipitor 10 mg Taking this also for cardiovascular prophylaxis  Recent levels:  Lab Results  Component Value Date   CHOL 164 08/01/2016   HDL 86.80 08/01/2016   LDLCALC 68 08/01/2016   TRIG 47.0 08/01/2016   CHOLHDL 2 08/01/2016    Previous history of hypertension: Is not on any medications except diuretics.    BP Readings from Last 3 Encounters:  03/16/17 120/68  02/23/17 119/72  02/14/17 114/74    Rash on leg, Some itching and chronic and relieved by steroid ointment   LABS:  Lab on 03/12/2017  Component Date Value Ref Range Status  . Hgb A1c MFr Bld 03/12/2017 6.9* 4.6 - 6.5 % Final   Glycemic Control Guidelines for People with Diabetes:Non Diabetic:  <6%Goal of Therapy: <7%Additional Action Suggested:  >8%   . Sodium 03/12/2017 139  135 - 145 mEq/L Final  . Potassium 03/12/2017 3.8  3.5 - 5.1 mEq/L Final  . Chloride 03/12/2017 102  96 - 112 mEq/L Final  . CO2 03/12/2017 30  19 - 32 mEq/L Final  . Glucose, Bld 03/12/2017 122* 70 - 99 mg/dL Final  . BUN 03/12/2017 16  6 - 23 mg/dL Final  . Creatinine, Ser 03/12/2017 0.77  0.40 - 1.20 mg/dL Final  . Total Bilirubin 03/12/2017 0.4  0.2 - 1.2 mg/dL Final  . Alkaline Phosphatase 03/12/2017 75  39 - 117 U/L Final  . AST 03/12/2017 32  0 - 37 U/L Final  . ALT 03/12/2017 49* 0 - 35 U/L Final  . Total Protein 03/12/2017 6.4  6.0 - 8.3 g/dL Final  . Albumin 03/12/2017 3.8  3.5 - 5.2 g/dL Final  . Calcium 03/12/2017 9.2  8.4 - 10.5 mg/dL Final  . GFR 03/12/2017 95.52  >60.00 mL/min Final  . TSH 03/12/2017 2.20  0.35 - 4.50 uIU/mL Final  Anti-coag visit on 03/09/2017  Component Date Value Ref Range Status  . INR 03/09/2017 2.6   Final  Admission on 02/23/2017, Discharged on 02/23/2017  Component Date Value Ref Range Status  . WBC 02/23/2017 5.7  4.0 - 10.5 K/uL Final  . RBC 02/23/2017 4.93  3.87 - 5.11 MIL/uL Final  . Hemoglobin 02/23/2017 13.9   12.0 - 15.0 g/dL Final  . HCT 02/23/2017 40.9  36.0 - 46.0 % Final  . MCV 02/23/2017 83.0  78.0 - 100.0 fL Final  . MCH 02/23/2017 28.2  26.0 - 34.0 pg Final  . MCHC 02/23/2017 34.0  30.0 - 36.0 g/dL Final  . RDW 02/23/2017 14.5  11.5 - 15.5 % Final  . Platelets 02/23/2017 211  150 - 400 K/uL Final  . Neutrophils Relative % 02/23/2017 63  % Final  . Neutro Abs 02/23/2017 3.6  1.7 - 7.7 K/uL Final  . Lymphocytes Relative 02/23/2017 32  % Final  . Lymphs Abs 02/23/2017 1.8  0.7 - 4.0 K/uL Final  . Monocytes Relative 02/23/2017 4  % Final  . Monocytes Absolute 02/23/2017 0.3  0.1 - 1.0 K/uL Final  . Eosinophils Relative 02/23/2017 0  % Final  .  Eosinophils Absolute 02/23/2017 0.0  0.0 - 0.7 K/uL Final  . Basophils Relative 02/23/2017 1  % Final  . Basophils Absolute 02/23/2017 0.0  0.0 - 0.1 K/uL Final  . Sodium 02/23/2017 138  135 - 145 mmol/L Final  . Potassium 02/23/2017 4.0  3.5 - 5.1 mmol/L Final  . Chloride 02/23/2017 100* 101 - 111 mmol/L Final  . CO2 02/23/2017 26  22 - 32 mmol/L Final  . Glucose, Bld 02/23/2017 109* 65 - 99 mg/dL Final  . BUN 02/23/2017 16  6 - 20 mg/dL Final  . Creatinine, Ser 02/23/2017 0.87  0.44 - 1.00 mg/dL Final  . Calcium 02/23/2017 9.9  8.9 - 10.3 mg/dL Final  . Total Protein 02/23/2017 8.0  6.5 - 8.1 g/dL Final  . Albumin 02/23/2017 4.1  3.5 - 5.0 g/dL Final  . AST 02/23/2017 40  15 - 41 U/L Final  . ALT 02/23/2017 54  14 - 54 U/L Final  . Alkaline Phosphatase 02/23/2017 98  38 - 126 U/L Final  . Total Bilirubin 02/23/2017 0.5  0.3 - 1.2 mg/dL Final  . GFR calc non Af Amer 02/23/2017 >60  >60 mL/min Final  . GFR calc Af Amer 02/23/2017 >60  >60 mL/min Final   Comment: (NOTE) The eGFR has been calculated using the CKD EPI equation. This calculation has not been validated in all clinical situations. eGFR's persistently <60 mL/min signify possible Chronic Kidney Disease.   . Anion gap 02/23/2017 12  5 - 15 Final  . Prothrombin Time 02/23/2017 34.4*  11.4 - 15.2 seconds Final  . INR 02/23/2017 3.31   Final  . Troponin i, poc 02/23/2017 0.00  0.00 - 0.08 ng/mL Final  . Comment 3 02/23/2017          Final   Comment: Due to the release kinetics of cTnI, a negative result within the first hours of the onset of symptoms does not rule out myocardial infarction with certainty. If myocardial infarction is still suspected, repeat the test at appropriate intervals.   . Color, Urine 02/23/2017 STRAW* YELLOW Final  . APPearance 02/23/2017 CLEAR  CLEAR Final  . Specific Gravity, Urine 02/23/2017 1.004* 1.005 - 1.030 Final  . pH 02/23/2017 8.0  5.0 - 8.0 Final  . Glucose, UA 02/23/2017 NEGATIVE  NEGATIVE mg/dL Final  . Hgb urine dipstick 02/23/2017 SMALL* NEGATIVE Final  . Bilirubin Urine 02/23/2017 NEGATIVE  NEGATIVE Final  . Ketones, ur 02/23/2017 NEGATIVE  NEGATIVE mg/dL Final  . Protein, ur 02/23/2017 NEGATIVE  NEGATIVE mg/dL Final  . Nitrite 02/23/2017 NEGATIVE  NEGATIVE Final  . Leukocytes, UA 02/23/2017 NEGATIVE  NEGATIVE Final  . RBC / HPF 02/23/2017 0-5  0 - 5 RBC/hpf Final  . WBC, UA 02/23/2017 0-5  0 - 5 WBC/hpf Final  . Bacteria, UA 02/23/2017 NONE SEEN  NONE SEEN Final  . Squamous Epithelial / LPF 02/23/2017 0-5* NONE SEEN Final  Admission on 02/13/2017, Discharged on 02/14/2017  Component Date Value Ref Range Status  . Sodium 02/13/2017 137  135 - 145 mmol/L Final  . Potassium 02/13/2017 3.9  3.5 - 5.1 mmol/L Final  . Chloride 02/13/2017 105  101 - 111 mmol/L Final  . CO2 02/13/2017 21* 22 - 32 mmol/L Final  . Glucose, Bld 02/13/2017 156* 65 - 99 mg/dL Final  . BUN 02/13/2017 21* 6 - 20 mg/dL Final  . Creatinine, Ser 02/13/2017 1.07* 0.44 - 1.00 mg/dL Final  . Calcium 02/13/2017 9.3  8.9 - 10.3 mg/dL Final  .  Total Protein 02/13/2017 6.8  6.5 - 8.1 g/dL Final  . Albumin 02/13/2017 3.7  3.5 - 5.0 g/dL Final  . AST 02/13/2017 43* 15 - 41 U/L Final  . ALT 02/13/2017 42  14 - 54 U/L Final  . Alkaline Phosphatase 02/13/2017 88   38 - 126 U/L Final  . Total Bilirubin 02/13/2017 0.4  0.3 - 1.2 mg/dL Final  . GFR calc non Af Amer 02/13/2017 52* >60 mL/min Final  . GFR calc Af Amer 02/13/2017 60* >60 mL/min Final   Comment: (NOTE) The eGFR has been calculated using the CKD EPI equation. This calculation has not been validated in all clinical situations. eGFR's persistently <60 mL/min signify possible Chronic Kidney Disease.   . Anion gap 02/13/2017 11  5 - 15 Final  . WBC 02/13/2017 5.6  4.0 - 10.5 K/uL Final  . RBC 02/13/2017 4.14  3.87 - 5.11 MIL/uL Final  . Hemoglobin 02/13/2017 11.4* 12.0 - 15.0 g/dL Final  . HCT 02/13/2017 36.4  36.0 - 46.0 % Final  . MCV 02/13/2017 87.9  78.0 - 100.0 fL Final  . MCH 02/13/2017 27.5  26.0 - 34.0 pg Final  . MCHC 02/13/2017 31.3  30.0 - 36.0 g/dL Final  . RDW 02/13/2017 15.2  11.5 - 15.5 % Final  . Platelets 02/13/2017 181  150 - 400 K/uL Final  . Glucose-Capillary 02/13/2017 158* 65 - 99 mg/dL Final  . Total CK 02/13/2017 134  38 - 234 U/L Final  . Magnesium 02/13/2017 1.8  1.7 - 2.4 mg/dL Final     EXAM:  BP 120/68   Pulse 63   Ht 5' 8"  (1.727 m)   Wt 218 lb 6.4 oz (99.1 kg)   SpO2 98%   BMI 33.21 kg/m     Assessment/Plan:   1. Reactive/postprandial hypoglycemia from dumping syndrome. Symptoms are Generally controlled with Precose, however she is not taking the 50 mg that she was supposed to be at lunchtime She had a low blood sugar at suppertime with a relatively low lighter meal at lunch Otherwise has minimal hypoglycemia with lowest blood sugars before supper She will switch the 50 mg dose to lunchtime instead of suppertime now Discussed getting sufficient amount of protein at each meal and if eating a light meal at lunch to have a midafternoon snack with protein   2.  Muscle cramps: Recently improved now  3. Mild diabetes: A1c Is relatively higher at 6.9 and not clear if she is benefiting from starting metformin at bedtime However home blood sugars are  averaging only 112 at various times and A1c appears higher than expected  She is trying to check blood sugars more frequently than usual We will continue follow her fasting readings which are slightly better overall Recommended trying to get back into exercise also and continue weight loss efforts  4.    EDEMA of the legs: Controlled on Lasix every other day and Aldactone No electrolyte abnormalities with potassium 3.8 recently  5.  Mildly abnormal liver functions, will continue to follow  6.  LOW back pain: She is wanting to switch from hydrocodone to another medication because of nausea and will use tramadol as a trial, explained to her how this works and to take it with food and may start with half a tablet initially  7.  Probable eczema Lasix: She will continue steroid ointment  8.  Vitamin D deficiency: Will recheck her levels on the next visit  9.  Mild hypothyroid and: Stable  on current low-dose supplementation  Patient Instructions  Take 2 Acarbose at lunch  Check blood sugars on waking up    Also check blood sugars about 2 hours after a meal and do this after different meals by rotation  Recommended blood sugar levels on waking up is 90-120 and about 2 hours after meal is 130-160  Please bring your blood sugar monitor to each visit, thank you   Total visit time for evaluation and management of multiple problems, review of ER records, labs, counseling = 25 minutes  Kelli Martinez 03/18/2017, 8:41 PM

## 2017-03-16 NOTE — Patient Instructions (Signed)
Take 2 Acarbose at lunch  Check blood sugars on waking up    Also check blood sugars about 2 hours after a meal and do this after different meals by rotation  Recommended blood sugar levels on waking up is 90-120 and about 2 hours after meal is 130-160  Please bring your blood sugar monitor to each visit, thank you

## 2017-03-18 MED ORDER — CLOBETASOL PROPIONATE 0.05 % EX CREA: 1.0000 "application " | TOPICAL_CREAM | Freq: Two times a day (BID) | CUTANEOUS | 1 refills | Status: DC

## 2017-03-19 ENCOUNTER — Telehealth: Payer: Self-pay | Admitting: Endocrinology

## 2017-03-19 NOTE — Telephone Encounter (Signed)
Called patient and gave her instructions per Dr. Dwyane Dee and she stated that she will try half a tablet to begin with and if not strong enough she will go to a full tablet. Patient also stated that she will be bringing her handicap renewal here to have Dr. Dwyane Dee sign.

## 2017-03-19 NOTE — Telephone Encounter (Signed)
Please advise 

## 2017-03-19 NOTE — Telephone Encounter (Signed)
I had asked her to try this instead of hydrocodone.  All medications may potentially have side effects.  She can try taking a half a tablet with food first

## 2017-03-19 NOTE — Telephone Encounter (Signed)
Patient has questions about taking the traMADol (ULTRAM) 50 MG tablet. Patient is unsure if she should take the medication due to the side effects. Call patient to advise at [phone] to leave a detailed message.

## 2017-03-23 NOTE — Telephone Encounter (Signed)
Patient calling to check on handicapped renewal forms that was dropped off o 08/20.  Please call patient to when ready to be picked up.  Ty,  -LL

## 2017-03-23 NOTE — Telephone Encounter (Signed)
Called patient and let her know that I have received the completed handicap renewal form from Dr. Dwyane Dee and I am leaving this up front at the front desk for her to pick up.

## 2017-03-26 ENCOUNTER — Telehealth: Payer: Self-pay | Admitting: Endocrinology

## 2017-03-26 ENCOUNTER — Other Ambulatory Visit: Payer: Self-pay

## 2017-03-26 MED ORDER — SPIRONOLACTONE 50 MG PO TABS: 50.0000 mg | ORAL_TABLET | Freq: Two times a day (BID) | ORAL | 0 refills | Status: DC

## 2017-03-26 NOTE — Telephone Encounter (Signed)
MEDICATION: spironolactone 50 mg twice daily  PHARMACY: [facility] [address]    IS THIS A 90 DAY SUPPLY : no, [facility] day supply [facility]  IS PATIENT OUT OF MEDICATION: yes  IF NOT; HOW MUCH IS LEFT: no  LAST APPOINTMENT [id number]  NEXT APPOINTMENT [id number]  OTHER COMMENTS: patient needs 30 day called in to [facility] supply   **Let patient know to contact pharmacy at the end of the day to make sure medication is ready. **  ** Please notify patient to allow 48-72 hours to process**  **Encourage patient to contact the pharmacy for refills or they can request refills through [facility]**

## 2017-03-26 NOTE — Telephone Encounter (Signed)
Routing to you °

## 2017-03-26 NOTE — Telephone Encounter (Signed)
Called patient and left a voice message to let her know that I have sent in a 30 day prescription to [facility] for her medication and to call us back to let us know if I need to send in a 90 day prescription to [facility] for her.

## 2017-04-05 ENCOUNTER — Other Ambulatory Visit: Payer: Self-pay | Admitting: Endocrinology

## 2017-04-05 NOTE — Patient Instructions (Signed)
Pre visit review using our clinic review tool, if applicable. No additional management support is needed unless otherwise documented below in the visit note. 

## 2017-04-06 ENCOUNTER — Ambulatory Visit (INDEPENDENT_AMBULATORY_CARE_PROVIDER_SITE_OTHER): Payer: Medicare Other | Admitting: General Practice

## 2017-04-06 DIAGNOSIS — Z7901 Long term (current) use of anticoagulants: Secondary | ICD-10-CM

## 2017-04-06 DIAGNOSIS — Z5181 Encounter for therapeutic drug level monitoring: Secondary | ICD-10-CM

## 2017-04-06 LAB — POCT INR: INR: 3.6

## 2017-04-14 ENCOUNTER — Other Ambulatory Visit: Payer: Self-pay | Admitting: Endocrinology

## 2017-04-27 ENCOUNTER — Ambulatory Visit (INDEPENDENT_AMBULATORY_CARE_PROVIDER_SITE_OTHER): Payer: Medicare Other | Admitting: General Practice

## 2017-04-27 DIAGNOSIS — Z7901 Long term (current) use of anticoagulants: Secondary | ICD-10-CM | POA: Diagnosis not present

## 2017-04-27 DIAGNOSIS — Z5181 Encounter for therapeutic drug level monitoring: Secondary | ICD-10-CM

## 2017-04-27 DIAGNOSIS — Z23 Encounter for immunization: Secondary | ICD-10-CM | POA: Diagnosis not present

## 2017-04-27 LAB — POCT INR: INR: 2.5

## 2017-04-27 NOTE — Patient Instructions (Signed)
Pre visit review using our clinic review tool, if applicable. No additional management support is needed unless otherwise documented below in the visit note. 

## 2017-05-11 ENCOUNTER — Other Ambulatory Visit: Payer: Self-pay | Admitting: Endocrinology

## 2017-05-17 ENCOUNTER — Other Ambulatory Visit: Payer: Self-pay | Admitting: Endocrinology

## 2017-05-25 ENCOUNTER — Ambulatory Visit (INDEPENDENT_AMBULATORY_CARE_PROVIDER_SITE_OTHER): Payer: Medicare Other | Admitting: General Practice

## 2017-05-25 DIAGNOSIS — Z7901 Long term (current) use of anticoagulants: Secondary | ICD-10-CM

## 2017-05-25 LAB — POCT INR: INR: 2.6

## 2017-05-25 NOTE — Patient Instructions (Signed)
Pre visit review using our clinic review tool, if applicable. No additional management support is needed unless otherwise documented below in the visit note. 

## 2017-05-29 DIAGNOSIS — L603 Nail dystrophy: Secondary | ICD-10-CM | POA: Diagnosis not present

## 2017-05-29 DIAGNOSIS — E1151 Type 2 diabetes mellitus with diabetic peripheral angiopathy without gangrene: Secondary | ICD-10-CM | POA: Diagnosis not present

## 2017-05-29 DIAGNOSIS — I739 Peripheral vascular disease, unspecified: Secondary | ICD-10-CM | POA: Diagnosis not present

## 2017-05-29 DIAGNOSIS — G5762 Lesion of plantar nerve, left lower limb: Secondary | ICD-10-CM | POA: Diagnosis not present

## 2017-05-29 DIAGNOSIS — L84 Corns and callosities: Secondary | ICD-10-CM | POA: Diagnosis not present

## 2017-05-29 DIAGNOSIS — M65871 Other synovitis and tenosynovitis, right ankle and foot: Secondary | ICD-10-CM | POA: Diagnosis not present

## 2017-05-29 DIAGNOSIS — M65872 Other synovitis and tenosynovitis, left ankle and foot: Secondary | ICD-10-CM | POA: Diagnosis not present

## 2017-06-04 ENCOUNTER — Other Ambulatory Visit: Payer: Self-pay | Admitting: Endocrinology

## 2017-06-11 ENCOUNTER — Other Ambulatory Visit (INDEPENDENT_AMBULATORY_CARE_PROVIDER_SITE_OTHER): Payer: Medicare Other

## 2017-06-11 DIAGNOSIS — K7689 Other specified diseases of liver: Secondary | ICD-10-CM | POA: Diagnosis not present

## 2017-06-11 DIAGNOSIS — E1142 Type 2 diabetes mellitus with diabetic polyneuropathy: Secondary | ICD-10-CM | POA: Diagnosis not present

## 2017-06-11 DIAGNOSIS — E559 Vitamin D deficiency, unspecified: Secondary | ICD-10-CM | POA: Diagnosis not present

## 2017-06-11 DIAGNOSIS — R945 Abnormal results of liver function studies: Secondary | ICD-10-CM

## 2017-06-11 LAB — COMPREHENSIVE METABOLIC PANEL
ALK PHOS: 75 U/L (ref 39–117)
ALT: 23 U/L (ref 0–35)
AST: 21 U/L (ref 0–37)
Albumin: 3.9 g/dL (ref 3.5–5.2)
BILIRUBIN TOTAL: 0.4 mg/dL (ref 0.2–1.2)
BUN: 11 mg/dL (ref 6–23)
CO2: 30 meq/L (ref 19–32)
Calcium: 9.3 mg/dL (ref 8.4–10.5)
Chloride: 102 mEq/L (ref 96–112)
Creatinine, Ser: 0.83 mg/dL (ref 0.40–1.20)
GFR: 87.53 mL/min (ref >60.00)
Glucose, Bld: 112 mg/dL — ABNORMAL HIGH (ref 70–99)
Potassium: 4.3 mEq/L (ref 3.5–5.1)
Sodium: 138 mEq/L (ref 135–145)
TOTAL PROTEIN: 6.8 g/dL (ref 6.0–8.3)

## 2017-06-11 LAB — VITAMIN D 25 HYDROXY (VIT D DEFICIENCY, FRACTURES): VITD: 36.69 ng/mL (ref 30.00–100.00)

## 2017-06-11 LAB — HEMOGLOBIN A1C: Hgb A1c MFr Bld: 6.4 % (ref 4.6–6.5)

## 2017-06-14 ENCOUNTER — Other Ambulatory Visit: Payer: Self-pay | Admitting: Endocrinology

## 2017-06-15 ENCOUNTER — Encounter: Payer: Self-pay | Admitting: Endocrinology

## 2017-06-15 ENCOUNTER — Ambulatory Visit (INDEPENDENT_AMBULATORY_CARE_PROVIDER_SITE_OTHER): Payer: Medicare Other | Admitting: Endocrinology

## 2017-06-15 VITALS — BP 110/72 | HR 64 | Ht 68.0 in | Wt 222.8 lb

## 2017-06-15 DIAGNOSIS — E1142 Type 2 diabetes mellitus with diabetic polyneuropathy: Secondary | ICD-10-CM | POA: Diagnosis not present

## 2017-06-15 DIAGNOSIS — E7800 Pure hypercholesterolemia, unspecified: Secondary | ICD-10-CM

## 2017-06-15 DIAGNOSIS — E161 Other hypoglycemia: Secondary | ICD-10-CM

## 2017-06-15 DIAGNOSIS — E063 Autoimmune thyroiditis: Secondary | ICD-10-CM | POA: Diagnosis not present

## 2017-06-15 NOTE — Patient Instructions (Addendum)
Midafternoon snack when active  Thigh high stocking  Restart Temazepam

## 2017-06-15 NOTE — Progress Notes (Signed)
Patient ID: Kelli Martinez, female   DOB: 01/28/1946, 71 y.o.   MRN: [id number]    Chief complaint: Followup of various issues   History of Present Illness:  The patient is seen for the following problems:  1. Postprandial hypoglycemia related to dumping syndrome   She has had postprandial hypoglycemia starting a couple of years after her gastric bypass surgery Her symptoms of blood sugars are mostly a feeling of significant weakness, some shakiness.  She will use fruits or juice in order to relieve the symptoms She has been to the dietitian and has been instructed on balanced low-fat meals with enough protein consistently and restricting carbohydrates including fruits   Recent history:  She has been treated with acarbose with reduction in frequency of hypoglycemia  She did try verapamil empirically but this did not help.  She is taking her Precose usually right before eating, 1 tablet at breakfast 2 at lunch and 1 at dinner  Recent blood sugars and management:  She has had a couple of low sugars in the last months, mostly around 2-4 PM, generally after lunch  This is despite having relatively small lunch with some protein consistently  She thinks lower sugars maybe occasionally related to increased activity with housework  However she has gained weight   Current blood sugar monitor: Freestyle  Recent blood sugar range from download:   Overall AVERAGE 96 Blood sugars range 45-143  Average blood sugar at any given time of the day ranges between 83-1 24   Wt Readings from Last 3 Encounters:  06/15/17 222 lb 12.8 oz (101.1 kg)  03/16/17 218 lb 6.4 oz (99.1 kg)  01/22/17 225 lb 2 oz (102.1 kg)     2.  History of diabetes:   This has been mild since her gastric bypass surgery  Usually has a normal A1c readings around 6.2-6.6 Overall blood sugars have been Reasonably controlled since her bariatric surgery without any medications.   However more  recently her A1c has been 6.8-6.9, now 6.4  When A1c was higher she was started on METFORMIN 500 mg at bedtime She is taking Precose mostly to prevent reactive hypoglycemia  Blood sugar readings: See above She is checking blood sugars on an average about once a day  Recently has gained weight, not doing any specific exercise recently She is to control his portions and carbohydrates   Lab Results  Component Value Date   HGBA1C 6.4 06/11/2017   HGBA1C 6.9 (H) 03/12/2017   HGBA1C 6.8 11/27/2016   Lab Results  Component Value Date   MICROALBUR <0.7 08/01/2016   LDLCALC 68 08/01/2016   CREATININE 0.83 06/11/2017      Other active problems: see review of systems      Allergies as of 06/15/2017      Reactions   Adhesive [tape] Rash      Medication List        Accurate as of 06/15/17 11:59 PM. Always use your most recent med list.          acarbose 25 MG tablet Commonly known as:  PRECOSE TAKE 1 TABLET THREE TIMES A DAY   albuterol 108 (90 Base) MCG/ACT inhaler Commonly known as:  PROAIR HFA USE 2 INHALATIONS FOUR TIMES A DAY AS NEEDED ( AS NEEDED RESCUE INHALER)   atorvastatin 20 MG tablet Commonly known as:  LIPITOR TAKE 1 TABLET DAILY   baclofen 20 MG tablet Commonly known as:  LIORESAL Take 1 tablet (20 mg total)  by mouth at bedtime.   baclofen 10 MG tablet Commonly known as:  LIORESAL Take 1 tablet (10 mg total) by mouth 2 (two) times daily. In the morning and afternoon.   CALTRATE 600+D 600-400 MG-UNIT tablet Generic drug:  Calcium Carbonate-Vitamin D Take 1 tablet by mouth daily.   clobetasol cream 0.05 % Commonly known as:  TEMOVATE Apply 1 application topically 2 (two) times daily.   diazepam 10 MG Gel Commonly known as:  DIASTAT ACUDIAL Place 5 mg rectally once as needed (cramping).   ferrous sulfate 325 (65 FE) MG tablet Take 325 mg by mouth daily.   fluticasone 50 MCG/ACT nasal spray Commonly known as:  FLONASE Place 2 sprays into  both nostrils daily.   Fluticasone-Salmeterol 100-50 MCG/DOSE Aepb Commonly known as:  ADVAIR DISKUS USE 1 INHALATION EVERY 12 HOURS, RINSE MOUTH   freestyle lancets USE AS INSTRUCTED TO CHECK BLOOD SUGAR ONCE DAILY   FREESTYLE LITE Devi Use to check blood sugar once a day DX code E11.65   FREESTYLE FREEDOM LITE w/Device Kit use as directed   FREESTYLE LITE test strip Generic drug:  glucose blood USE AS INSTRUCTED TO CHECK BLOOD SUGAR ONCE DAILY   furosemide 40 MG tablet Commonly known as:  LASIX TAKE 1 TABLET EVERY MORNING   gabapentin 300 MG capsule Commonly known as:  NEURONTIN Take 2 capsules (600 mg total) by mouth 3 (three) times daily.   HYDROcodone-acetaminophen 5-325 MG tablet Commonly known as:  NORCO Take 1 tablet by mouth every 4 (four) hours as needed for moderate pain.   levothyroxine 125 MCG tablet Commonly known as:  SYNTHROID, LEVOTHROID TAKE 1 TABLET DAILY BEFORE BREAKFAST   magnesium chloride 64 MG Tbec SR tablet Commonly known as:  SLOW-MAG Take 1 tablet (64 mg total) by mouth 2 (two) times daily.   metFORMIN 500 MG tablet Commonly known as:  GLUCOPHAGE Take 1 tablet (500 mg total) by mouth at bedtime.   methylcellulose 1 % ophthalmic solution Commonly known as:  ARTIFICIAL TEARS Place 1 drop into both eyes as needed. Dry eyes   NON FORMULARY CPAP 6 [employer]   omeprazole 20 MG capsule Commonly known as:  PRILOSEC TAKE 1 CAPSULE DAILY   spironolactone 50 MG tablet Commonly known as:  ALDACTONE TAKE 1 TABLET TWICE A DAY   temazepam 15 MG capsule Commonly known as:  RESTORIL 1-2 for sleep as needed   traMADol 50 MG tablet Commonly known as:  ULTRAM Take 1 tablet (50 mg total) by mouth every 12 (twelve) hours as needed.   vitamin B-12 500 MCG tablet Commonly known as:  CYANOCOBALAMIN Take 500 mcg by mouth daily.   Vitamin D (Ergocalciferol) 50000 units Caps capsule Commonly known as:  DRISDOL TAKE 1 CAPSULE EVERY FRIDAY     warfarin 5 MG tablet Commonly known as:  COUMADIN Take as directed by the anticoagulation clinic. If you are unsure how to take this medication, talk to your nurse or doctor. Original instructions:  TAKE 2 TABLETS ONCE DAILY (THIS IS TO REPLACE THE 10 MG TABLET)       Allergies:  Allergies  Allergen Reactions  . Adhesive [Tape] Rash    Past Medical History:  Diagnosis Date  . Allergy   . Anemia   . Arthritis   . Asthma   . Back pain, chronic    GETS INJECTIONS IN BACK  . Blind right eye    hemorrhage  . Blood transfusion   . Cancer [facility])    breast  1994  . Clotting disorder ([place])   . Diabetes mellitus without complication ([place])    takes precose  . Elevated liver enzymes   . Gallstones   . GERD (gastroesophageal reflux disease)   . HX: breast cancer   . Hyperlipidemia   . Hypertension   . Hypothyroid   . Lymphedema of arm    RT  . Morbid obesity ([place])   . OSA (obstructive sleep apnea)    has not used in 2 years-lost weight  . Osteoporosis   . Peripheral neuropathy    on gabapentin  . PONV (postoperative nausea and vomiting)   . Psoriasis   . Pulmonary embolism ([place]) 1998 / 1994 /1968  . Syncope and collapse 2011   due to anemia    Past Surgical History:  Procedure Laterality Date  . ABDOMINAL HYSTERECTOMY    . BONE MARROW TRANSPLANT  1994  . CARDIOVASCULAR STRESS TEST  05/23/2005   EF 53%  . carpal tunnell     bil  . cataracts    . EXCISION RIGHT AXILLARY LESION AND CHEST WALL LESION Right 11/24/2016   Performed by Johnathan Hausen, MD at [facility]  . GASTRIC BYPASS  2011   bariatric surgery  . HIATAL HERNIA REPAIR    . IVC filter     recurrent DVT  . KNEE ARTHROTOMY  1998  . LAPAROSCOPIC CHOLECYSTECTOMY WITH INTRAOPERATIVE CHOLANGIOGRAM N/A 05/10/2012   Performed by Pedro Earls, MD at [facility]  . MASTECTOMY     Right  . US ECHOCARDIOGRAPHY  10/27/08   EF 55-60%    Family History  Problem Relation Age of Onset  . Sudden  death Mother        car accident  . Cancer Father 44       lung  . Cancer Sister 16       lung cancer  . Asthma Daughter     Social History:  reports that she quit smoking about 38 years ago. Her smoking use included cigarettes. She has a 5.00 pack-year smoking history. she has never used smokeless tobacco. She reports that she does not drink alcohol or use drugs.  Review of Systems   INSOMNIA: She has stopped taking her temazepam given by her pulmonologist because of fear of side effects like nausea or interaction with other medications, however she had done  well without this causing side effects previously  Neuropathy with burning and lower leg pain: Nortriptyline is helping symptoms However sometimes does get leg pain especially with her tendency to swelling  MUSCLE cramps/spasms: She is currently doing well with taking 10 mg baclofen during the day and 20 mg at night   EDEMA: She has had problems with  edema both in her legs and right arm Has been on 50 mg bid of Aldactone   She has taken Lasix q od  with variable control She has again not been using the compression stockings as prescribed because of cost   Lymphedema right arm, secondary to  breast surgery, using  elastic compression stocking, needs another one    History of mild hypothyroidism: Adequately replaced  with 62.5 mcg ,  has consistently normal TSH   Lab Results  Component Value Date   TSH 2.20 03/12/2017    Hypercholesterolemia: LDL has been at target with Lipitor 10 mg Taking this also for cardiovascular prophylaxis  Lipid levels:  Lab Results  Component Value Date   CHOL 164 08/01/2016   HDL 86.80 08/01/2016  [place] 68 08/01/2016   TRIG 47.0 08/01/2016   CHOLHDL 2 08/01/2016    Previous history of hypertension: Is not on any medications except diuretics.    BP Readings from Last 3 Encounters:  06/15/17 110/72  03/16/17 120/68  02/23/17 119/72       LABS:  Lab on 06/11/2017    Component Date Value Ref Range Status  . VITD 06/11/2017 36.69  30.00 - 100.00 ng/mL Final  . Sodium 06/11/2017 138  135 - 145 mEq/L Final  . Potassium 06/11/2017 4.3  3.5 - 5.1 mEq/L Final  . Chloride 06/11/2017 102  96 - 112 mEq/L Final  . CO2 06/11/2017 30  19 - 32 mEq/L Final  . Glucose, Bld 06/11/2017 112* 70 - 99 mg/dL Final  . BUN 06/11/2017 11  6 - 23 mg/dL Final  . Creatinine, Ser 06/11/2017 0.83  0.40 - 1.20 mg/dL Final  . Total Bilirubin 06/11/2017 0.4  0.2 - 1.2 mg/dL Final  . Alkaline Phosphatase 06/11/2017 75  39 - 117 U/L Final  . AST 06/11/2017 21  0 - 37 U/L Final  . ALT 06/11/2017 23  0 - 35 U/L Final  . Total Protein 06/11/2017 6.8  6.0 - 8.3 g/dL Final  . Albumin 06/11/2017 3.9  3.5 - 5.2 g/dL Final  . Calcium 06/11/2017 9.3  8.4 - 10.5 mg/dL Final  . GFR 06/11/2017 87.53  >60.00 mL/min Final  . Hgb A1c MFr Bld 06/11/2017 6.4  4.6 - 6.5 % Final   Glycemic Control Guidelines for People with Diabetes:Non Diabetic:  <6%Goal of Therapy: <7%Additional Action Suggested:  >8%   Anti-coag visit on 05/25/2017  Component Date Value Ref Range Status  . INR 05/25/2017 2.6   Final     EXAM:  BP 110/72   Pulse 64   Ht _0  (1.727 m)   Wt 222 lb 12.8 oz (101.1 kg)   SpO2 95%   BMI 33.88 kg/m     Assessment/Plan:   1. Reactive/postprandial hypoglycemia from dumping syndrome.  Hypoglycemic episodes are controlled with Precose, however she occasionally gets low sugars after lunch with symptoms This may be related to variable gastric emptying because of her being compliant with her Precose and diet which is consistent and protein at lunch and other meals Will not change her regimen However encouraged her to have a midafternoon snack especially when she is more active   2.  Muscle cramps: Controlled with baclofen  3. Mild diabetes: A1c Is now better with continuing low dose metformin along with Precose This is despite her weight gain Home blood sugars are  averaging about 96 although not always checking postprandials Recommended trying to get back into exercise also and continue weight loss efforts  4.    EDEMA of the legs: Mostly controlled on Lasix every other day and Aldactone She now agrees to use elastic stockings with mild compression and given prescription for thigh high stockings, she needs to wear these in the mornings after her shower daily  5.  Mildly abnormal liver functions, back to normal  6.  LOW back pain: She is doing reasonably well with tramadol and not needing hydrocodone which was causing nausea  7.  Hypercholesterolemia: Needs follow-up levels on the next visit  8.  Vitamin D deficiency: Adequately replaced  9.  Mild hypothyroidism: Will need follow-up on next visit  10.:  Recurrent insomnia: This is causing her fatigue and daytime somnolence and she can go back to temazepam and discussed that this  should not cause nausea, she will also discuss with her pulmonologist  Patient Instructions  Midafternoon snack when active  Thigh high stocking  Restart Temazepam   Kelli Martinez 06/16/2017, 6:21 PM   Total visit time for evaluation and management of multiple problems, counseling, review of labs, medications = 25 minutes

## 2017-06-19 ENCOUNTER — Ambulatory Visit (INDEPENDENT_AMBULATORY_CARE_PROVIDER_SITE_OTHER): Payer: Medicare Other | Admitting: General Practice

## 2017-06-19 DIAGNOSIS — Z7901 Long term (current) use of anticoagulants: Secondary | ICD-10-CM | POA: Diagnosis not present

## 2017-06-19 LAB — POCT INR: INR: 4

## 2017-06-19 NOTE — Patient Instructions (Addendum)
Pre visit review using our clinic review tool, if applicable. No additional management support is needed unless otherwise documented below in the visit note.  Skip coumadin tomorrow (11/21) and take 5 mg on Thursday (11/22) and then continue to take 2 tablets all days except 3 tablets on Mondays and Fridays.  Re-check in 3 weeks.

## 2017-06-22 ENCOUNTER — Other Ambulatory Visit: Payer: Self-pay | Admitting: Endocrinology

## 2017-06-23 ENCOUNTER — Other Ambulatory Visit: Payer: Self-pay | Admitting: Endocrinology

## 2017-06-24 ENCOUNTER — Other Ambulatory Visit: Payer: Self-pay | Admitting: Endocrinology

## 2017-06-24 DIAGNOSIS — Z5181 Encounter for therapeutic drug level monitoring: Secondary | ICD-10-CM

## 2017-07-03 ENCOUNTER — Other Ambulatory Visit: Payer: Self-pay

## 2017-07-03 MED ORDER — METFORMIN HCL 500 MG PO TABS: 500.0000 mg | ORAL_TABLET | Freq: Every day | ORAL | 3 refills | Status: DC

## 2017-07-09 ENCOUNTER — Ambulatory Visit: Payer: Medicare Other | Admitting: Neurology

## 2017-07-09 ENCOUNTER — Other Ambulatory Visit: Payer: Medicare Other

## 2017-07-10 ENCOUNTER — Ambulatory Visit: Payer: Medicare Other

## 2017-07-13 ENCOUNTER — Ambulatory Visit (INDEPENDENT_AMBULATORY_CARE_PROVIDER_SITE_OTHER): Payer: Medicare Other | Admitting: General Practice

## 2017-07-13 ENCOUNTER — Other Ambulatory Visit: Payer: Self-pay | Admitting: Endocrinology

## 2017-07-13 ENCOUNTER — Ambulatory Visit: Payer: Medicare Other

## 2017-07-13 DIAGNOSIS — Z7901 Long term (current) use of anticoagulants: Secondary | ICD-10-CM

## 2017-07-13 LAB — POCT INR: INR: 3

## 2017-07-13 NOTE — Patient Instructions (Signed)
Pre visit review using our clinic review tool, if applicable. No additional management support is needed unless otherwise documented below in the visit note. 

## 2017-07-19 ENCOUNTER — Encounter (INDEPENDENT_AMBULATORY_CARE_PROVIDER_SITE_OTHER): Payer: Medicare Other | Admitting: Ophthalmology

## 2017-07-19 DIAGNOSIS — H353122 Nonexudative age-related macular degeneration, left eye, intermediate dry stage: Secondary | ICD-10-CM | POA: Diagnosis not present

## 2017-07-19 DIAGNOSIS — H35033 Hypertensive retinopathy, bilateral: Secondary | ICD-10-CM | POA: Diagnosis not present

## 2017-07-19 DIAGNOSIS — H43811 Vitreous degeneration, right eye: Secondary | ICD-10-CM | POA: Diagnosis not present

## 2017-07-19 DIAGNOSIS — H348322 Tributary (branch) retinal vein occlusion, left eye, stable: Secondary | ICD-10-CM | POA: Diagnosis not present

## 2017-07-19 DIAGNOSIS — I10 Essential (primary) hypertension: Secondary | ICD-10-CM | POA: Diagnosis not present

## 2017-07-19 DIAGNOSIS — H353211 Exudative age-related macular degeneration, right eye, with active choroidal neovascularization: Secondary | ICD-10-CM | POA: Diagnosis not present

## 2017-07-19 DIAGNOSIS — H33302 Unspecified retinal break, left eye: Secondary | ICD-10-CM | POA: Diagnosis not present

## 2017-07-27 ENCOUNTER — Ambulatory Visit (INDEPENDENT_AMBULATORY_CARE_PROVIDER_SITE_OTHER): Payer: Medicare Other | Admitting: Neurology

## 2017-07-27 ENCOUNTER — Encounter: Payer: Self-pay | Admitting: Neurology

## 2017-07-27 VITALS — BP 120/80 | HR 71 | Ht 68.0 in | Wt 226.1 lb

## 2017-07-27 DIAGNOSIS — G629 Polyneuropathy, unspecified: Secondary | ICD-10-CM

## 2017-07-27 NOTE — Patient Instructions (Signed)
Return to clinic in 6 months.

## 2017-07-27 NOTE — Progress Notes (Signed)
Follow-up Visit   Date: 07/27/17    Kelli Martinez MRN: [id number] DOB: 04/01/1946   Interim History: Kelli Martinez is a 71 y.o. right-handed African American female with hypothyroidism, pulmonary embolism, hyperlipidemia, RLS, history of breast cancer, s/p gastric bypass (2011) returning to the clinic for follow-up of neuropathy.  The patient was accompanied to the clinic by self.  History of present illness: She has history of neuropathy affecting the feet for many years, but this has worsened over the past 5 years.  Symptoms started with numbness and tingling involving the feet and over time has involved her lower legs and hands.  She has sensitivity to light touch over the feet.  She takes gabapentin 682m 6 times daily, which provides some relief.  She does not have weakness and has not suffered any falls.  She stumbles at times and walks with a cane as needed.   Starting around 2017, she began having tingling sensation over the left hip and radiate down her leg.  [facility].  MRI lumbar spine from 2008 shows degenerative changes and facet disease at L4-5 and small disc protrusion at L5-S1.  She had not had any updated imaging or tried physical therapy.    She had gastric bypass in 2011 and has postprandial hypoglycemia.  She has history of diabetes, last HbA1c 6.5.  She does not drink alcohol and there is no family history of neuropathy.  UPDATE 01/22/2017:  She is here for 3 month follow-up.  Since starting nortriptyline, she noticed marked improvement in her neuropathy, but she complains of extreme dry mouth, dry eyes, and constipation.  She feels that there is worsening tingling in the hands and she occassionally has muscle cramps and takes tonic water. There is no weakness or neck pain. She suffered a fall last week because her knee buckled due to knee pain.    UPDATE 07/27/2017:  She is here for 6 month follow-up  visit.  She continues to have painful paresthesias of the legs and fingertip, which is relatively controlled on gabapentin 6050mTID.  Since discontinuing nortriptyline, her dry eyes has resolved. She denies any hand weakness, leg weakness, or imbalance.  She had one ER visit in July because of taking too much baclofen and now takes 1059mID for muscle cramps, which helps.  Medications:  Current Outpatient Medications on File Prior to Visit  Medication Sig Dispense Refill  . acarbose (PRECOSE) 25 MG tablet TAKE 1 TABLET THREE TIMES A DAY 270 tablet 1  . albuterol (PROAIR HFA) 108 (90 Base) MCG/ACT inhaler USE 2 INHALATIONS FOUR TIMES A DAY AS NEEDED ( AS NEEDED RESCUE INHALER) 54 g 3  . atorvastatin (LIPITOR) 20 MG tablet TAKE 1 TABLET DAILY (Patient taking differently: No sig reported) 90 tablet 2  . baclofen (LIORESAL) 10 MG tablet TAKE 1 TABLET FOUR TIMES A DAY 360 tablet 1  . baclofen (LIORESAL) 20 MG tablet Take 1 tablet (20 mg total) by mouth at bedtime. 90 tablet 2  . Blood Glucose Monitoring Suppl (FREESTYLE FREEDOM LITE) W/DEVICE KIT use as directed 1 each 0  . Blood Glucose Monitoring Suppl (FREESTYLE LITE) DEVI Use to check blood sugar once a day DX code E11.65 1 each 0  . Calcium Carbonate-Vitamin D (CALTRATE 600+D) 600-400 MG-UNIT tablet Take 1 tablet by mouth daily.    . clobetasol cream (TEMOVATE) 0.02.92Apply 1 application topically 2 (two) times daily. 45 g  1  . diazepam (DIASTAT ACUDIAL) 10 MG GEL Place 5 mg rectally once as needed (cramping). 2 Package 0  . ferrous sulfate 325 (65 FE) MG tablet Take 325 mg by mouth daily.      . fluticasone (FLONASE) 50 MCG/ACT nasal spray Place 2 sprays into both nostrils daily. 48 g 3  . Fluticasone-Salmeterol (ADVAIR DISKUS) 100-50 MCG/DOSE AEPB USE 1 INHALATION EVERY 12 HOURS, RINSE MOUTH 180 each 3  . FREESTYLE LITE test strip USE AS INSTRUCTED TO CHECK BLOOD SUGAR ONCE DAILY 100 each 1  . furosemide (LASIX) 40 MG tablet TAKE 1 TABLET EVERY  MORNING 90 tablet 1  . gabapentin (NEURONTIN) 300 MG capsule Take 2 capsules (600 mg total) by mouth 3 (three) times daily. 540 capsule 3  . HYDROcodone-acetaminophen (NORCO) 5-325 MG tablet Take 1 tablet by mouth every 4 (four) hours as needed for moderate pain. 30 tablet 0  . Lancets (FREESTYLE) lancets USE AS INSTRUCTED TO CHECK BLOOD SUGAR ONCE DAILY 100 each 1  . levothyroxine (SYNTHROID, LEVOTHROID) 125 MCG tablet TAKE 1 TABLET DAILY BEFORE BREAKFAST 90 tablet 1  . magnesium chloride (SLOW-MAG) 64 MG TBEC SR tablet Take 1 tablet (64 mg total) by mouth 2 (two) times daily. 60 tablet 6  . metFORMIN (GLUCOPHAGE) 500 MG tablet Take 1 tablet (500 mg total) by mouth at bedtime. 90 tablet 3  . methylcellulose (ARTIFICIAL TEARS) 1 % ophthalmic solution Place 1 drop into both eyes as needed. Dry eyes    . NON FORMULARY CPAP 6 [employer]    . omeprazole (PRILOSEC) 20 MG capsule TAKE 1 CAPSULE DAILY 90 capsule 0  . spironolactone (ALDACTONE) 50 MG tablet TAKE 1 TABLET TWICE A DAY 180 tablet 1  . temazepam (RESTORIL) 15 MG capsule 1-2 for sleep as needed 90 capsule 1  . traMADol (ULTRAM) 50 MG tablet Take 1 tablet (50 mg total) by mouth every 12 (twelve) hours as needed. 30 tablet 0  . vitamin B-12 (CYANOCOBALAMIN) 500 MCG tablet Take 500 mcg by mouth daily.      . Vitamin D, Ergocalciferol, (DRISDOL) 50000 units CAPS capsule TAKE 1 CAPSULE EVERY FRIDAY 12 capsule 1  . warfarin (COUMADIN) 5 MG tablet TAKE 2 TABLETS ONCE DAILY (THIS IS TO REPLACE THE 10 MG TABLET) (Patient taking differently: TAKE 2 TABLETS ONCE DAILY (THIS IS TO REPLACE THE 10 MG TABLET) 5 Days a week, 3 tablets Monday and Friday) 180 tablet 1   No current facility-administered medications on file prior to visit.     Allergies:  Allergies  Allergen Reactions  . Adhesive [Tape] Rash    Review of Systems:  CONSTITUTIONAL: No fevers, chills, night sweats, or weight loss.  EYES: No visual changes or eye pain ENT: No hearing changes.   No history of nose bleeds.   RESPIRATORY: No cough, wheezing and shortness of breath.   CARDIOVASCULAR: Negative for chest pain, and palpitations.   GI: Negative for abdominal discomfort, blood in stools or black stools.  No recent change in bowel habits.   GU:  No history of incontinence.   MUSCLOSKELETAL: +history of joint pain or swelling.  No myalgias.   SKIN: Negative for lesions, rash, and itching.   ENDOCRINE: Negative for cold or heat intolerance, polydipsia or goiter.   PSYCH:  No depression or anxiety symptoms.   NEURO: As Above.   Vital Signs:  BP 120/80   Pulse 71   Ht 5' 8"  (1.727 m)   Wt 226 lb 2 oz (102.6 kg)  SpO2 96%   BMI 34.38 kg/m    Neurological Exam: MENTAL STATUS including orientation to time, place, person, recent and remote memory, attention span and concentration, language, and fund of knowledge is normal.  Speech is not dysarthric.  CRANIAL NERVES:  Face is symmetric.  MOTOR:  Motor strength is 5/5 in all extremities, including intrinsic hand muscles.   MSRs:  Right                                                                 Left brachioradialis 2+  brachioradialis 2+  biceps 2+  biceps 2+  triceps 2+  triceps 2+  patellar tr  Patellar tr  ankle jerk 0  ankle jerk 0   SENSORY:  Gradient pattern of sensory less distal to knees and hands bilaterally to pin prick, temperature, light touch, and vibration.  COORDINATION/GAIT:   Gait narrow based and stable.   Data: Labs 10/09/2016:  ESR 29, CRP 0.1, ANA neg, SSA/B neg, vitamin B12 936, vitamin B1 10, copper 186, folate 12.6  IMPRESSION/PLAN: 1.  Peripheral neuropathy following glove-stocking distribution, contributed by diabetes and chemotherapy.  She does not wish to have NCS/EMG to further evaluate symptoms.  Neuropathy labs looking for inflammation, autoimmune disease, and nutritional deficiency returned normal.  Currently, symptoms are controlled on gabapentin 628m TID.  She did not tolerate  nortriptyline (dry eyes, constipation).  2.  Lumbar radiculopathy causing descending pain and paresthesias of the legs.  She sees Dr. RNelva Bushfor this.  3.  History of bilateral CTS release  Return to clinic in 6 months  Greater than 50% of this 20 minute visit was spent in counseling, explanation of diagnosis, planning of further management, and coordination of care   Thank you for allowing me to participate in patient's care.  If I can answer any additional questions, I would be pleased to do so.    Sincerely,    Donika K. PPosey Pronto DO

## 2017-07-30 ENCOUNTER — Ambulatory Visit (INDEPENDENT_AMBULATORY_CARE_PROVIDER_SITE_OTHER): Payer: Medicare Other | Admitting: Ophthalmology

## 2017-08-10 ENCOUNTER — Ambulatory Visit: Payer: Medicare Other

## 2017-08-14 ENCOUNTER — Ambulatory Visit (INDEPENDENT_AMBULATORY_CARE_PROVIDER_SITE_OTHER): Payer: Medicare Other | Admitting: General Practice

## 2017-08-14 DIAGNOSIS — Z7901 Long term (current) use of anticoagulants: Secondary | ICD-10-CM

## 2017-08-14 LAB — POCT INR: INR: 3.3

## 2017-08-14 NOTE — Patient Instructions (Addendum)
Pre visit review using our clinic review tool, if applicable. No additional management support is needed unless otherwise documented below in the visit note.  Skip dosage tomorrow (1/16) and then continue to take 2 tablets all days except 3 tablets on Mondays and Fridays.  Re-check in 4 weeks.

## 2017-08-24 DIAGNOSIS — M898X9 Other specified disorders of bone, unspecified site: Secondary | ICD-10-CM | POA: Diagnosis not present

## 2017-08-28 ENCOUNTER — Ambulatory Visit
Admission: RE | Admit: 2017-08-28 | Discharge: 2017-08-28 | Disposition: A | Discharge status: Home / Self Care | Payer: Medicare Other | Source: Ambulatory Visit | Attending: Surgery | Admitting: Surgery

## 2017-08-28 ENCOUNTER — Other Ambulatory Visit: Payer: Self-pay | Admitting: Endocrinology

## 2017-08-28 ENCOUNTER — Other Ambulatory Visit: Payer: Self-pay | Admitting: Surgery

## 2017-08-28 DIAGNOSIS — M898X9 Other specified disorders of bone, unspecified site: Secondary | ICD-10-CM

## 2017-08-28 DIAGNOSIS — M619 Calcification and ossification of muscle, unspecified: Secondary | ICD-10-CM | POA: Diagnosis not present

## 2017-08-28 DIAGNOSIS — Z4682 Encounter for fitting and adjustment of non-vascular catheter: Secondary | ICD-10-CM | POA: Diagnosis not present

## 2017-08-31 ENCOUNTER — Encounter: Payer: Self-pay | Admitting: Internal Medicine

## 2017-08-31 ENCOUNTER — Ambulatory Visit (INDEPENDENT_AMBULATORY_CARE_PROVIDER_SITE_OTHER): Payer: Medicare Other | Admitting: Internal Medicine

## 2017-08-31 VITALS — BP 124/88 | HR 66 | Ht 69.0 in | Wt 224.0 lb

## 2017-08-31 DIAGNOSIS — J4530 Mild persistent asthma, uncomplicated: Secondary | ICD-10-CM

## 2017-08-31 DIAGNOSIS — F5101 Primary insomnia: Secondary | ICD-10-CM

## 2017-08-31 DIAGNOSIS — J0100 Acute maxillary sinusitis, unspecified: Secondary | ICD-10-CM | POA: Diagnosis not present

## 2017-08-31 DIAGNOSIS — J0101 Acute recurrent maxillary sinusitis: Secondary | ICD-10-CM | POA: Diagnosis not present

## 2017-08-31 MED ORDER — ALBUTEROL SULFATE HFA 108 (90 BASE) MCG/ACT IN AERS: INHALATION_SPRAY | RESPIRATORY_TRACT | 3 refills | Status: DC

## 2017-08-31 MED ORDER — FLUTICASONE-SALMETEROL 100-50 MCG/DOSE IN AEPB: INHALATION_SPRAY | RESPIRATORY_TRACT | 3 refills | Status: DC

## 2017-08-31 MED ORDER — TEMAZEPAM 15 MG PO CAPS: ORAL_CAPSULE | ORAL | 1 refills | Status: DC

## 2017-08-31 MED ORDER — FLUTICASONE PROPIONATE 50 MCG/ACT NA SUSP: 2.0000 | Freq: Every day | NASAL | 3 refills | Status: DC

## 2017-08-31 MED ORDER — METHYLPREDNISOLONE ACETATE 80 MG/ML IJ SUSP
80.0000 mg | Freq: Once | INTRAMUSCULAR | Status: AC
  Administered 2017-08-31: 80 mg via INTRAMUSCULAR

## 2017-08-31 MED ORDER — PHENYLEPHRINE HCL 1 % NA SOLN
3.0000 [drp] | Freq: Once | NASAL | Status: AC
  Administered 2017-08-31: 3 [drp] via NASAL

## 2017-08-31 MED ORDER — AMOXICILLIN 500 MG PO TABS: 500.0000 mg | ORAL_TABLET | Freq: Two times a day (BID) | ORAL | 0 refills | Status: DC

## 2017-08-31 NOTE — Assessment & Plan Note (Signed)
Primary insomnia.  Discussed sleep hygiene. Plan-refill temazepam

## 2017-08-31 NOTE — Progress Notes (Signed)
Subjective:    Patient ID: Kelli Martinez, female    DOB: Nov 27, 1945, 72 y.o.   MRN: [id number]  HPI F former smoker followed for OSA/ quit CPAP, allergic rhinitis,asthma,  hx pulm embolism/DVT/filter/coumadin(Dr Dwyane Dee), hx R breast Ca/ mastectomy/ xrt/chem/bonemarrow transplant at [facility], hx gastric bypass Office Spirometry 04/27/2015-WNL-FEV1/FVC 0.81, FEV1 2.50/110%  ----------------------------------------------------------------------------------------  08/31/2016-70 yoF former smoker followed for OSA/ quit CPAP, allergic rhinitis,asthma,  hx pulm embolism/DVT/filter/coumadin(Dr Dwyane Dee), hx R breast Ca/ mastectomy/ xrt/chem/bonemarrow transplant at [place], hx gastric bypass FOLLOWS FOR:  Pt is not wearing CPAP machine, pt states that she had trouble sleeping with it and was waking several times per night. Pt also c/o nasal and throat dryness.  Occasional minimal chest tightness and dry cough with scant clear mucus. Using rescue inhaler 2 or 3 times a week. Continues Advair. Flonase helps her nasal symptoms with some stuffiness. Seeing neurology about "unsteadiness" with feeling that "head is in a vise". Not clear if she is describing eustachian dysfunction with vertigo. Continues temazepam which has helped a lot, well tolerated for insomnia.  08/31/17- 71 yoF former smoker followed for OSA/ quit CPAP, insomnia, allergic rhinitis,asthma,  hx pulm embolism/DVT/filter/coumadin(Dr Dwyane Dee), hx R breast Ca/ mastectomy/ xrt/chem/bonemarrow transplant at [facility], hx gastric bypass ----yearly ROV, sinus congestion Needs meds refilled.  Uses rescue inhaler every few days with little sleep disturbance. Woke 2 days ago with sore throat, progressive head congestion, bilateral maxillary pain and pressure, active postnasal drip.  No fever or adenopathy, cough or wheeze. CXR 08/28/17 IMPRESSION: No active cardiopulmonary disease.  Review of Systems- see HPI + = positive Constitutional:   No-   weight loss,  night sweats, fevers, chills, fatigue, lassitude. HEENT:   No-  headaches, difficulty swallowing, tooth/dental problems, sore throat,   +dry mouth      No-  sneezing, itching, ear ache,  +nasal congestion, +post nasal drip,  CV:  No-   chest pain, orthopnea, PND, swelling in lower extremities, anasarca, dizziness, palpitations Resp: No-   shortness of breath with exertion or at rest.              No-   productive cough,   non-productive cough,  No-  coughing up of blood.              No-   change in color of mucus.   Skin: No-   rash or lesions. GI:  No-   heartburn, indigestion, abdominal pain, nausea, vomiting,  GU:  MS:  No-   joint pain,    swelling.   Neuro- : +HPI Psych:  No- change in mood or affect. No depression or anxiety.  No memory loss.   Objective:   Physical Exam General- Alert, Oriented, Affect-appropriate, Distress- none acute  + obese Skin- rash-none, lesions- none, excoriation- none. XRT skin changes right lateral neck Lymphadenopathy- none Head- atraumatic            Eyes- Gross vision intact, PERRLA, conjunctivae clear secretions            Ears- Hearing, canals, TMs normal            Nose- Clear, no- Septal dev, + sticky mucus, polyps, erosion, perforation             Throat- Mallampati III , mucosa clear , drainage- none, tonsils- atrophic, missing teeth Neck- flexible , trachea midline, no stridor , thyroid nl, carotid no bruit Chest - symmetrical excursion , unlabored  Heart/CV- RRR , no murmur , no gallop  , no rub, nl s1 s2                           - JVD- none , edema+L>R, stasis changes- none, varices- none           Lung- clear to P&A, wheeze- none, cough- none , dullness-none, rub- none           Chest wall- + right mastectomy Abd-  Br/ Gen/ Rectal- Not done, not indicated Extrem- cyanosis- none, clubbing, none, atrophy- none, strength- nl   Heavy legs. +Lymphedema right forearm/ elastic sleeve Neuro- grossly intact to observation  Assessment  & Plan:

## 2017-08-31 NOTE — Patient Instructions (Signed)
Script refilling temazepam to be phoned in to your mail order pharmacy.   Other scripts sent  Script for amoxacillin antibiotic sent  Order- neb neo nasal    Dx acute recurrent maxillary sinusitis              Depo 80

## 2017-08-31 NOTE — Assessment & Plan Note (Signed)
Remote pulmonary embolism, chronic risk for recurrence.  Coumadin managed elsewhere.  Not sure if her IVC filter is considered potentially removable.

## 2017-08-31 NOTE — Assessment & Plan Note (Signed)
Plan-nasal nebulizer decongestant, Depo-Medrol with blood sugar discussion, amoxicillin

## 2017-08-31 NOTE — Assessment & Plan Note (Signed)
Mild persistent uncomplicated.  Her upper respiratory infection may trigger exacerbation by the end of the week. Plan-refilled her inhalers with discussion

## 2017-09-11 ENCOUNTER — Ambulatory Visit: Payer: Medicare Other

## 2017-09-11 ENCOUNTER — Telehealth: Payer: Self-pay

## 2017-09-11 ENCOUNTER — Telehealth: Payer: Self-pay | Admitting: Endocrinology

## 2017-09-11 DIAGNOSIS — D2372 Other benign neoplasm of skin of left lower limb, including hip: Secondary | ICD-10-CM | POA: Diagnosis not present

## 2017-09-11 DIAGNOSIS — E1151 Type 2 diabetes mellitus with diabetic peripheral angiopathy without gangrene: Secondary | ICD-10-CM | POA: Diagnosis not present

## 2017-09-11 DIAGNOSIS — I739 Peripheral vascular disease, unspecified: Secondary | ICD-10-CM | POA: Diagnosis not present

## 2017-09-11 DIAGNOSIS — D2371 Other benign neoplasm of skin of right lower limb, including hip: Secondary | ICD-10-CM | POA: Diagnosis not present

## 2017-09-11 DIAGNOSIS — L603 Nail dystrophy: Secondary | ICD-10-CM | POA: Diagnosis not present

## 2017-09-11 NOTE — Telephone Encounter (Signed)
Noted  

## 2017-09-11 NOTE — Telephone Encounter (Signed)
Patient lab appointment is 09/17/17 she would loke to know if Kelli Martinez would order her Lab test for a PT INR. Please advise

## 2017-09-11 NOTE — Telephone Encounter (Signed)
Copied from [place]. Topic: Quick Communication - See Telephone Encounter >> Sep 11, 2017  3:20 PM Kelli Martinez, [place] wrote: CRM for notification. See Telephone encounter for:   09/11/17. Pt. Would like for cindy from coumadin clinic to give her a call [phone] pt. Did not state reason for call but changed appt. time

## 2017-09-12 NOTE — Telephone Encounter (Signed)
Please advise 

## 2017-09-12 NOTE — Telephone Encounter (Signed)
Patient called and stated that Dr. Villa Herb from [place] over at [facility] has been handling her Coumadin. She stated that she had a death in her family and she has not been able to go anywhere else and wanted to know if she can get done in our office? She just wanted to do here so she could have Dr. Luciana Axe read it since she floats to different offices during the week.  Please advise.

## 2017-09-12 NOTE — Telephone Encounter (Signed)
Called patient and left a voice message for her to call us back to let us know who is adjusting her Coumadin.

## 2017-09-12 NOTE — Telephone Encounter (Signed)
Please find out who is adjusting her Coumadin now

## 2017-09-13 ENCOUNTER — Other Ambulatory Visit: Payer: Self-pay | Admitting: Endocrinology

## 2017-09-13 NOTE — Telephone Encounter (Signed)
Called patient and left a voice message on her mobile number to let her know the message from Dr. Dwyane Dee.

## 2017-09-13 NOTE — Telephone Encounter (Signed)
She has a standing order for the test

## 2017-09-14 ENCOUNTER — Ambulatory Visit: Payer: Medicare Other

## 2017-09-17 ENCOUNTER — Other Ambulatory Visit (INDEPENDENT_AMBULATORY_CARE_PROVIDER_SITE_OTHER): Payer: Medicare Other

## 2017-09-17 ENCOUNTER — Telehealth: Payer: Self-pay | Admitting: Endocrinology

## 2017-09-17 ENCOUNTER — Other Ambulatory Visit: Payer: Self-pay

## 2017-09-17 DIAGNOSIS — E1142 Type 2 diabetes mellitus with diabetic polyneuropathy: Secondary | ICD-10-CM | POA: Diagnosis not present

## 2017-09-17 DIAGNOSIS — E7800 Pure hypercholesterolemia, unspecified: Secondary | ICD-10-CM

## 2017-09-17 DIAGNOSIS — Z5181 Encounter for therapeutic drug level monitoring: Secondary | ICD-10-CM | POA: Diagnosis not present

## 2017-09-17 DIAGNOSIS — E063 Autoimmune thyroiditis: Secondary | ICD-10-CM

## 2017-09-17 LAB — COMPREHENSIVE METABOLIC PANEL
ALBUMIN: 3.8 g/dL (ref 3.5–5.2)
ALK PHOS: 82 U/L (ref 39–117)
ALT: 33 U/L (ref 0–35)
AST: 31 U/L (ref 0–37)
BILIRUBIN TOTAL: 0.4 mg/dL (ref 0.2–1.2)
BUN: 12 mg/dL (ref 6–23)
CALCIUM: 9.1 mg/dL (ref 8.4–10.5)
CO2: 30 mEq/L (ref 19–32)
CREATININE: 0.82 mg/dL (ref 0.40–1.20)
Chloride: 102 mEq/L (ref 96–112)
GFR: 88.69 mL/min (ref >60.00)
Glucose, Bld: 125 mg/dL — ABNORMAL HIGH (ref 70–99)
Potassium: 3.8 mEq/L (ref 3.5–5.1)
Sodium: 139 mEq/L (ref 135–145)
TOTAL PROTEIN: 6.8 g/dL (ref 6.0–8.3)

## 2017-09-17 LAB — LIPID PANEL
CHOL/HDL RATIO: 2
Cholesterol: 176 mg/dL (ref 0–200)
HDL: 87.3 mg/dL (ref >39.00)
LDL Cholesterol: 79 mg/dL (ref 0–99)
NonHDL: 88.71
Triglycerides: 51 mg/dL (ref 0.0–149.0)
VLDL: 10.2 mg/dL (ref 0.0–40.0)

## 2017-09-17 LAB — HEMOGLOBIN A1C: HEMOGLOBIN A1C: 6.8 % — AB (ref 4.6–6.5)

## 2017-09-17 LAB — TSH: TSH: 2.81 u[IU]/mL (ref 0.35–4.50)

## 2017-09-17 LAB — PROTIME-INR
INR: 5.3 ratio — AB (ref 0.8–1.0)
Prothrombin Time: 56.5 s (ref 9.6–13.1)

## 2017-09-17 LAB — T4, FREE: FREE T4: 0.89 ng/dL (ref 0.60–1.60)

## 2017-09-17 NOTE — Telephone Encounter (Signed)
Her prothrombin time is markedly increased, needs to have dosage adjusted. Confirm that she is taking 10 mg daily.  Needs to stop this for 3 days and then start with one pill daily and have prothrombin time repeated next week

## 2017-09-17 NOTE — Telephone Encounter (Signed)
Patient advised to discontinue medication for 3 days and go back on one tablet a day only.

## 2017-09-17 NOTE — Telephone Encounter (Signed)
Patient is scheduled for labs on Monday please enter orders. Thanks.

## 2017-09-21 ENCOUNTER — Encounter: Payer: Self-pay | Admitting: Endocrinology

## 2017-09-21 ENCOUNTER — Ambulatory Visit: Payer: Medicare Other

## 2017-09-21 ENCOUNTER — Ambulatory Visit (INDEPENDENT_AMBULATORY_CARE_PROVIDER_SITE_OTHER): Payer: Medicare Other | Admitting: Endocrinology

## 2017-09-21 VITALS — BP 101/60 | HR 64 | Ht 69.0 in | Wt 231.1 lb

## 2017-09-21 DIAGNOSIS — E161 Other hypoglycemia: Secondary | ICD-10-CM

## 2017-09-21 DIAGNOSIS — Z7901 Long term (current) use of anticoagulants: Secondary | ICD-10-CM

## 2017-09-21 DIAGNOSIS — E063 Autoimmune thyroiditis: Secondary | ICD-10-CM | POA: Diagnosis not present

## 2017-09-21 DIAGNOSIS — R635 Abnormal weight gain: Secondary | ICD-10-CM

## 2017-09-21 DIAGNOSIS — E1142 Type 2 diabetes mellitus with diabetic polyneuropathy: Secondary | ICD-10-CM

## 2017-09-21 LAB — PROTIME-INR
INR: 1.3 ratio — ABNORMAL HIGH (ref 0.8–1.0)
Prothrombin Time: 14.4 s — ABNORMAL HIGH (ref 9.6–13.1)

## 2017-09-21 NOTE — Patient Instructions (Addendum)
Check blood sugars on waking up  2/7  Also check blood sugars about 2 hours after a meal and do this after different meals by rotation  Recommended blood sugar levels on waking up is 90-130 and about 2 hours after meal is 130-160  Please bring your blood sugar monitor to each visit, thank you  Only 1 slice toast at lunch  Glucose tabs 2-3 when sugar low

## 2017-09-21 NOTE — Progress Notes (Signed)
Patient ID: Kelli Martinez, female   DOB: 1945/11/28, 72 y.o.   MRN: [id number]    Chief complaint: Followup of various issues   History of Present Illness:  The patient is seen for the following problems:  1. Postprandial hypoglycemia related to dumping syndrome   She has had postprandial hypoglycemia starting a couple of years after her gastric bypass surgery Her symptoms of blood sugars are mostly a feeling of significant weakness, some shakiness.  She will use fruits or juice in order to relieve the symptoms She has been to the dietitian and has been instructed on balanced low-fat meals with enough protein consistently and restricting carbohydrates including fruits   Recent history:  She has been treated with acarbose with reduction in frequency of hypoglycemia  She did try verapamil empirically but this did not help.  She is taking her Precose usually right before eating, 1 tablet at breakfast 2 at lunch and 1 at dinner  Recent blood sugars and management:  She has had again some tendency to low sugars after lunch although she has not documented these all the time and did not bring her monitor  She also thinks that she feels symptomatic when blood sugars are around 80 when she feels somewhat weak, dizzy and feels better with eating or drinking something with simple sugars, she thinks she tends to over treat the low sugar and subsequently it may be as high as 250  Her low sugars have been in the upper 50s  However even though she had been previously instructed on diet she is eating 2 slices of bread at lunchtime and not consistently reducing carbohydrates at lunch  Not symptomatic at any other time  She is getting protein with every meal usually  She has gained gone up 4 pounds and not able to consistently lose any  She is not always able to exercise with various issues    Current blood sugar monitor: Freestyle  Not able to download meter, no records  available   Wt Readings from Last 3 Encounters:  09/21/17 231 lb 1.6 oz (104.8 kg)  08/31/17 224 lb (101.6 kg)  07/27/17 226 lb 2 oz (102.6 kg)     2.  History of diabetes:   This has been mild since her gastric bypass surgery  Usually has a normal A1c readings around 6.2-6.6 Overall blood sugars have been Reasonably controlled since her bariatric surgery without any medications.  However more recently her A1c has been 6.8-6.9, now 6.8  Her recent glucose in the lab was 125 fasting, however had a steroid injection from her allergist about 2 weeks prior to that When A1c was higher she was started on  She continues on METFORMIN 500 mg at bedtime that was started when her A1c was higher Does not appear to have high readings after meals by history although not taking much in the evening She is taking Precose mostly to prevent reactive hypoglycemia  Blood sugar readings: See above She is checking blood sugars on an average less than once a day  Recently has gained weight again, not doing any specific exercise recently except housework    Lab Results  Component Value Date   HGBA1C 6.8 (H) 09/17/2017   HGBA1C 6.4 06/11/2017   HGBA1C 6.9 (H) 03/12/2017   Lab Results  Component Value Date   MICROALBUR <0.7 08/01/2016   [place] 79 09/17/2017   CREATININE 0.82 09/17/2017      Other active problems: see review of systems  Allergies as of 09/21/2017      Reactions   Adhesive [tape] Rash      Medication List        Accurate as of 09/21/17 10:20 AM. Always use your most recent med list.          acarbose 25 MG tablet Commonly known as:  PRECOSE TAKE 1 TABLET THREE TIMES A DAY   albuterol 108 (90 Base) MCG/ACT inhaler Commonly known as:  PROAIR HFA USE 2 INHALATIONS FOUR TIMES A DAY AS NEEDED ( AS NEEDED RESCUE INHALER)   atorvastatin 20 MG tablet Commonly known as:  LIPITOR TAKE 1 TABLET DAILY   baclofen 10 MG tablet Commonly known as:  LIORESAL TAKE 1  TABLET FOUR TIMES A DAY   CALTRATE 600+D 600-400 MG-UNIT tablet Generic drug:  Calcium Carbonate-Vitamin D Take 1 tablet by mouth daily.   clobetasol cream 0.05 % Commonly known as:  TEMOVATE Apply 1 application topically 2 (two) times daily.   diazepam 10 MG Gel Commonly known as:  DIASTAT ACUDIAL Place 5 mg rectally once as needed (cramping).   ferrous sulfate 325 (65 FE) MG tablet Take 325 mg by mouth daily.   fluticasone 50 MCG/ACT nasal spray Commonly known as:  FLONASE Place 2 sprays into both nostrils daily.   Fluticasone-Salmeterol 100-50 MCG/DOSE Aepb Commonly known as:  ADVAIR DISKUS USE 1 INHALATION EVERY 12 HOURS, RINSE MOUTH   freestyle lancets USE AS INSTRUCTED TO CHECK BLOOD SUGAR ONCE DAILY   FREESTYLE LITE Devi Use to check blood sugar once a day DX code E11.65   FREESTYLE FREEDOM LITE w/Device Kit use as directed   FREESTYLE LITE test strip Generic drug:  glucose blood USE AS INSTRUCTED TO CHECK BLOOD SUGAR ONCE DAILY   furosemide 40 MG tablet Commonly known as:  LASIX TAKE 1 TABLET EVERY MORNING   gabapentin 300 MG capsule Commonly known as:  NEURONTIN Take 2 capsules (600 mg total) by mouth 3 (three) times daily.   levothyroxine 125 MCG tablet Commonly known as:  SYNTHROID, LEVOTHROID TAKE 1 TABLET DAILY BEFORE BREAKFAST   magnesium chloride 64 MG Tbec SR tablet Commonly known as:  SLOW-MAG Take 1 tablet (64 mg total) by mouth 2 (two) times daily.   metFORMIN 500 MG tablet Commonly known as:  GLUCOPHAGE Take 1 tablet (500 mg total) by mouth at bedtime.   methylcellulose 1 % ophthalmic solution Commonly known as:  ARTIFICIAL TEARS Place 1 drop into both eyes as needed. Dry eyes   NON FORMULARY CPAP 6 [employer]   omeprazole 20 MG capsule Commonly known as:  PRILOSEC TAKE 1 CAPSULE DAILY   spironolactone 50 MG tablet Commonly known as:  ALDACTONE TAKE 1 TABLET TWICE A DAY   temazepam 15 MG capsule Commonly known as:   RESTORIL 1-2 for sleep as needed   traMADol 50 MG tablet Commonly known as:  ULTRAM Take 1 tablet (50 mg total) by mouth every 12 (twelve) hours as needed.   vitamin B-12 500 MCG tablet Commonly known as:  CYANOCOBALAMIN Take 500 mcg by mouth daily.   Vitamin D (Ergocalciferol) 50000 units Caps capsule Commonly known as:  DRISDOL TAKE 1 CAPSULE EVERY FRIDAY   warfarin 5 MG tablet Commonly known as:  COUMADIN Take as directed by the anticoagulation clinic. If you are unsure how to take this medication, talk to your nurse or doctor. Original instructions:  TAKE 2 TABLETS ONCE DAILY (THIS IS TO REPLACE THE 10 MG TABLET)  Allergies:  Allergies  Allergen Reactions  . Adhesive [Tape] Rash    Past Medical History:  Diagnosis Date  . Allergy   . Anemia   . Arthritis   . Asthma   . Back pain, chronic    GETS INJECTIONS IN BACK  . Blind right eye    hemorrhage  . Blood transfusion   . Cancer ([place])    breast 1994  . Clotting disorder ([place])   . Diabetes mellitus without complication ([place])    takes precose  . Elevated liver enzymes   . Gallstones   . GERD (gastroesophageal reflux disease)   . HX: breast cancer   . Hyperlipidemia   . Hypertension   . Hypothyroid   . Lymphedema of arm    RT  . Morbid obesity ([place])   . OSA (obstructive sleep apnea)    has not used in 2 years-lost weight  . Osteoporosis   . Peripheral neuropathy    on gabapentin  . PONV (postoperative nausea and vomiting)   . Psoriasis   . Pulmonary embolism ([place]) 1998 / 1994 /1968  . Syncope and collapse 2011   due to anemia    Past Surgical History:  Procedure Laterality Date  . ABDOMINAL HYSTERECTOMY    . BONE MARROW TRANSPLANT  1994  . CARDIOVASCULAR STRESS TEST  05/23/2005   EF 53%  . carpal tunnell     bil  . cataracts    . CHOLECYSTECTOMY  05/10/2012   Procedure: LAPAROSCOPIC CHOLECYSTECTOMY WITH INTRAOPERATIVE CHOLANGIOGRAM;  Surgeon: Pedro Earls, MD;  Location: [facility];   Service: General;  Laterality: N/A;  Laparoscopic Cholecystectomy with Intraoperative Cholangiogram  . GASTRIC BYPASS  2011   bariatric surgery  . HIATAL HERNIA REPAIR    . IVC filter     recurrent DVT  . KNEE ARTHROTOMY  1998  . MASS EXCISION Right 11/24/2016   Procedure: EXCISION RIGHT AXILLARY LESION AND CHEST WALL LESION;  Surgeon: Johnathan Hausen, MD;  Location: [place];  Service: General;  Laterality: Right;  . MASTECTOMY     Right  . US ECHOCARDIOGRAPHY  10/27/08   EF 55-60%    Family History  Problem Relation Age of Onset  . Sudden death Mother        car accident  . Cancer Father 70       lung  . Cancer Sister 24       lung cancer  . Asthma Daughter     Social History:  reports that she quit smoking about 39 years ago. Her smoking use included cigarettes. She has a 5.00 pack-year smoking history. she has never used smokeless tobacco. She reports that she does not drink alcohol or use drugs.  Review of Systems   ANTICOAGULATION: Her last INR was markedly increased at 5.3 and she does not know why this is so because she has not taken any aspirin or nonsteroidal anti-inflammatory drugs, no symptoms of bleeding She did stop the Coumadin for 3 days and has started back on 5 mg today  Neuropathy with burning and lower leg pain:  She gets some relief with nortriptyline, also on gabapentin   MUSCLE cramps/spasms: She is being treated with 10 mg baclofen during the day and 20 mg at night   EDEMA: She has had problems with  edema both in her legs and right arm Has controlled with 50 mg bid of Aldactone   She has taken Lasix q od usually She has again not been  using the compression stockings as prescribed because of cost   Lymphedema right arm, secondary to  breast surgery, using  elastic compression stocking, needs another one    History of mild hypothyroidism: For some time has been controlled with 62.5 mcg ,  has consistently normal TSH   Lab Results   Component Value Date   TSH 2.81 09/17/2017    Hypercholesterolemia: LDL has been at target with Lipitor 10 mg Taking this also for cardiovascular prophylaxis  Lipid levels:  Lab Results  Component Value Date   CHOL 176 09/17/2017   HDL 87.30 09/17/2017   LDLCALC 79 09/17/2017   TRIG 51.0 09/17/2017   CHOLHDL 2 09/17/2017    Previous history of hypertension: Is not on any treatment except diuretics.    BP Readings from Last 3 Encounters:  09/21/17 101/60  08/31/17 124/88  07/27/17 120/80       LABS:  Lab on 09/17/2017  Component Date Value Ref Range Status  . Free T4 09/17/2017 0.89  0.60 - 1.60 ng/dL Final   Comment: Specimens from patients who are undergoing biotin therapy and /or ingesting biotin supplements may contain high levels of biotin.  The higher biotin concentration in these specimens interferes with this Free T4 assay.  Specimens that contain high levels  of biotin may cause false high results for this Free T4 assay.  Please interpret results in light of the total clinical presentation of the patient.    Marland Kitchen TSH 09/17/2017 2.81  0.35 - 4.50 uIU/mL Final  . Cholesterol 09/17/2017 176  0 - 200 mg/dL Final   ATP III Classification       Desirable:  < 200 mg/dL               Borderline High:  200 - 239 mg/dL          High:  > = 240 mg/dL  . Triglycerides 09/17/2017 51.0  0.0 - 149.0 mg/dL Final   Normal:  <150 mg/dLBorderline High:  150 - 199 mg/dL  . HDL 09/17/2017 87.30  >39.00 mg/dL Final  . VLDL 09/17/2017 10.2  0.0 - 40.0 mg/dL Final  . LDL Cholesterol 09/17/2017 79  0 - 99 mg/dL Final  . Total CHOL/HDL Ratio 09/17/2017 2   Final                  Men          Women1/2 Average Risk     3.4          3.3Average Risk          5.0          4.42X Average Risk          9.6          7.13X Average Risk          15.0          11.0                      . NonHDL 09/17/2017 88.71   Final   NOTE:  Non-HDL goal should be 30 mg/dL higher than patient's LDL goal (i.e. LDL  goal of < 70 mg/dL, would have non-HDL goal of < 100 mg/dL)  . Sodium 09/17/2017 139  135 - 145 mEq/L Final  . Potassium 09/17/2017 3.8  3.5 - 5.1 mEq/L Final  . Chloride 09/17/2017 102  96 - 112 mEq/L Final  . CO2 09/17/2017 30  19 - 32  mEq/L Final  . Glucose, Bld 09/17/2017 125* 70 - 99 mg/dL Final  . BUN 09/17/2017 12  6 - 23 mg/dL Final  . Creatinine, Ser 09/17/2017 0.82  0.40 - 1.20 mg/dL Final  . Total Bilirubin 09/17/2017 0.4  0.2 - 1.2 mg/dL Final  . Alkaline Phosphatase 09/17/2017 82  39 - 117 U/L Final  . AST 09/17/2017 31  0 - 37 U/L Final  . ALT 09/17/2017 33  0 - 35 U/L Final  . Total Protein 09/17/2017 6.8  6.0 - 8.3 g/dL Final  . Albumin 09/17/2017 3.8  3.5 - 5.2 g/dL Final  . Calcium 09/17/2017 9.1  8.4 - 10.5 mg/dL Final  . GFR 09/17/2017 88.69  >60.00 mL/min Final  . Hgb A1c MFr Bld 09/17/2017 6.8* 4.6 - 6.5 % Final   Glycemic Control Guidelines for People with Diabetes:Non Diabetic:  <6%Goal of Therapy: <7%Additional Action Suggested:  >8%   . INR 09/17/2017 5.3* 0.8 - 1.0 ratio Final  . Prothrombin Time 09/17/2017 56.5* 9.6 - 13.1 sec Final     EXAM:  BP 101/60   Pulse 64   Ht 5' 9"  (1.753 m)   Wt 231 lb 1.6 oz (104.8 kg)   BMI 34.13 kg/m     Assessment/Plan:   1. Reactive/postprandial hypoglycemia from dumping syndrome.  Hypoglycemic episodes are recently occurring after lunch This is despite being compliant with Precose Most likely this is related to her recent increase in her carbohydrate intake with 2 slices of bread at lunchtime No other significant hypoglycemia at other times Lowest blood sugar documented is 57, however she occasionally gets symptoms when blood sugars are only in the 80s  She will continue acarbose but no change in dosage, and she does need to cut back on carbohydrates at lunch since this may be provoking her reactive hypoglycemia from dumping  Today discussed in detail the causation of hypoglycemia, balanced diet and  appropriate treatment Since the treatment has been more with food and things like peanut butter she is not getting quick relief of symptoms and then having rebound along with tendency to weight gain She will use only glucose tablets or a form of simple sugar in a drink if needed    2.   Unusually increased INR is not explained by any interactions, she is off Coumadin until today and will recheck INR today Explained to her what drugs or OTC medications can interfere with her INR  3. Mild diabetes: A1c Is now higher at 6.8 despite continuing low dose metformin along with Precose  This may be because of her weight gain, increased insulin resistance, recent use of steroid as well as inadequate exercise and over treatment of low normal blood sugars  She does need to monitor more readings after meals also consistently Recommended trying to get back into exercise also and continue weight loss efforts Discussed fasting blood sugar and postprandial targets  4.   Muscle cramps: Controlled with baclofen  7.  Hypercholesterolemia: Well controlled  8.  Mild hypothyroidism: Controlled and will continue the same dose  Patient Instructions  Check blood sugars on waking up  2/7  Also check blood sugars about 2 hours after a meal and do this after different meals by rotation  Recommended blood sugar levels on waking up is 90-130 and about 2 hours after meal is 130-160  Please bring your blood sugar monitor to each visit, thank you  Only 1 slice toast at lunch  Glucose tabs 2-3 when sugar low  Elayne Snare 09/21/2017, 10:20 AM   Counseling time on subjects discussed in assessment and plan sections is over 50% of today's 25 minute visit

## 2017-09-24 ENCOUNTER — Other Ambulatory Visit: Payer: Medicare Other

## 2017-09-25 ENCOUNTER — Ambulatory Visit (INDEPENDENT_AMBULATORY_CARE_PROVIDER_SITE_OTHER): Payer: Medicare Other | Admitting: General Practice

## 2017-09-25 ENCOUNTER — Telehealth: Payer: Self-pay | Admitting: Endocrinology

## 2017-09-25 ENCOUNTER — Other Ambulatory Visit: Payer: Self-pay | Admitting: General Practice

## 2017-09-25 DIAGNOSIS — Z7901 Long term (current) use of anticoagulants: Secondary | ICD-10-CM | POA: Diagnosis not present

## 2017-09-25 LAB — POCT INR: INR: 1.3

## 2017-09-25 MED ORDER — WARFARIN SODIUM 5 MG PO TABS: ORAL_TABLET | ORAL | 1 refills | Status: DC

## 2017-09-25 NOTE — Telephone Encounter (Signed)
Patient is returning your call.  

## 2017-09-25 NOTE — Patient Instructions (Addendum)
Pre visit review using our clinic review tool, if applicable. No additional management support is needed unless otherwise documented below in the visit note.  Take 3 tablets today (2/26), take 2 1/2 tomorrow (2/27) and then on Thursday start taking 2 tablets daily except 3 tablets on Mondays. Re-check in 1 week.

## 2017-09-26 NOTE — Progress Notes (Signed)
INR was checked on Tuesday, 2/26.  See anticoagulation note.

## 2017-10-01 NOTE — Telephone Encounter (Signed)
Spoke to patient. Went over previous telephone notes.  Pt states she met with Jenny Reichmann on this past Friday and she is managing her coumadin

## 2017-10-02 ENCOUNTER — Ambulatory Visit (INDEPENDENT_AMBULATORY_CARE_PROVIDER_SITE_OTHER): Payer: Medicare Other | Admitting: General Practice

## 2017-10-02 DIAGNOSIS — Z7901 Long term (current) use of anticoagulants: Secondary | ICD-10-CM | POA: Diagnosis not present

## 2017-10-02 LAB — POCT INR: INR: 2.7

## 2017-10-02 NOTE — Patient Instructions (Signed)
Pre visit review using our clinic review tool, if applicable. No additional management support is needed unless otherwise documented below in the visit note.  Continue to take 2 tablets daily except 3 tablets on Mondays. Re-check in 4 weeks.

## 2017-10-09 DIAGNOSIS — M898X9 Other specified disorders of bone, unspecified site: Secondary | ICD-10-CM | POA: Diagnosis not present

## 2017-10-09 DIAGNOSIS — D2372 Other benign neoplasm of skin of left lower limb, including hip: Secondary | ICD-10-CM | POA: Diagnosis not present

## 2017-10-09 DIAGNOSIS — M25572 Pain in left ankle and joints of left foot: Secondary | ICD-10-CM | POA: Diagnosis not present

## 2017-10-09 DIAGNOSIS — D2371 Other benign neoplasm of skin of right lower limb, including hip: Secondary | ICD-10-CM | POA: Diagnosis not present

## 2017-10-11 ENCOUNTER — Other Ambulatory Visit: Payer: Self-pay | Admitting: Endocrinology

## 2017-10-19 ENCOUNTER — Telehealth: Payer: Self-pay | Admitting: Endocrinology

## 2017-10-19 NOTE — Telephone Encounter (Signed)
Patient stated that her muscles have increased to the point she have to take 10 baclofen pills in order to get relief, the spasms are going in her head and all over her body, please advise

## 2017-10-23 NOTE — Telephone Encounter (Signed)
Please call patient for further information and have her call neurologist also for suggestions

## 2017-10-23 NOTE — Telephone Encounter (Signed)
The messages not clear.  Would like her to consult her neurologist about the symptoms also

## 2017-10-24 ENCOUNTER — Other Ambulatory Visit: Payer: Self-pay | Admitting: Endocrinology

## 2017-10-24 ENCOUNTER — Telehealth: Payer: Self-pay | Admitting: Endocrinology

## 2017-10-24 DIAGNOSIS — R252 Cramp and spasm: Secondary | ICD-10-CM

## 2017-10-24 MED ORDER — BACLOFEN 20 MG PO TABS: ORAL_TABLET | ORAL | 1 refills | Status: DC

## 2017-10-24 NOTE — Telephone Encounter (Signed)
Patient returning call from office.

## 2017-10-24 NOTE — Telephone Encounter (Signed)
I called pt- she states on certain days she has been taking Baclofen 10 mg 1 tid up to 5 xd to get relief. She feels like the 10 mg tabs aren't helping her as she is having trouble walking and the pain is also in her neck which causes her to have trouble swallowing. Please advise.

## 2017-10-24 NOTE — Telephone Encounter (Signed)
She will try taking 2 tablets of magnesium twice a day and also come in for labs for chemistry panel tomorrow Her baclofen will be changed to 20 mg which she can take at bedtime and take up to 4 of the 10 mg during the day, prescription sent

## 2017-10-24 NOTE — Telephone Encounter (Signed)
Left mess for patient to call back.  

## 2017-10-24 NOTE — Telephone Encounter (Signed)
See 10/19/17 phone note this has been resolved

## 2017-10-25 ENCOUNTER — Other Ambulatory Visit (INDEPENDENT_AMBULATORY_CARE_PROVIDER_SITE_OTHER): Payer: Medicare Other

## 2017-10-25 DIAGNOSIS — R252 Cramp and spasm: Secondary | ICD-10-CM | POA: Diagnosis not present

## 2017-10-25 LAB — BASIC METABOLIC PANEL
BUN: 15 mg/dL (ref 6–23)
CALCIUM: 9.1 mg/dL (ref 8.4–10.5)
CO2: 31 mEq/L (ref 19–32)
Chloride: 103 mEq/L (ref 96–112)
Creatinine, Ser: 0.83 mg/dL (ref 0.40–1.20)
GFR: 87.44 mL/min (ref >60.00)
GLUCOSE: 115 mg/dL — AB (ref 70–99)
Potassium: 4 mEq/L (ref 3.5–5.1)
Sodium: 139 mEq/L (ref 135–145)

## 2017-10-25 LAB — MAGNESIUM: Magnesium: 2 mg/dL (ref 1.5–2.5)

## 2017-10-25 NOTE — Telephone Encounter (Signed)
Pt informed of below. Lab appt scheduled.

## 2017-10-25 NOTE — Progress Notes (Signed)
Please call to let patient know that the lab results are normal. Need to try Verapramil SR 120mg  qd if still cramping

## 2017-10-26 ENCOUNTER — Other Ambulatory Visit: Payer: Self-pay

## 2017-10-29 ENCOUNTER — Ambulatory Visit (INDEPENDENT_AMBULATORY_CARE_PROVIDER_SITE_OTHER): Payer: Medicare Other | Admitting: General Practice

## 2017-10-29 DIAGNOSIS — Z7901 Long term (current) use of anticoagulants: Secondary | ICD-10-CM

## 2017-10-29 LAB — POCT INR: INR: 2.3

## 2017-10-29 NOTE — Patient Instructions (Addendum)
Pre visit review using our clinic review tool, if applicable. No additional management support is needed unless otherwise documented below in the visit note.  Continue to take 2 tablets daily except 3 tablets on Mondays. Re-check in 4 weeks.

## 2017-10-30 ENCOUNTER — Ambulatory Visit: Payer: Medicare Other

## 2017-11-01 ENCOUNTER — Ambulatory Visit (INDEPENDENT_AMBULATORY_CARE_PROVIDER_SITE_OTHER): Payer: Medicare Other | Admitting: Neurology

## 2017-11-01 ENCOUNTER — Ambulatory Visit: Payer: Medicare Other | Admitting: Neurology

## 2017-11-01 ENCOUNTER — Encounter: Payer: Self-pay | Admitting: Neurology

## 2017-11-01 VITALS — BP 104/70 | HR 72 | Ht 69.0 in | Wt 233.2 lb

## 2017-11-01 DIAGNOSIS — M542 Cervicalgia: Secondary | ICD-10-CM

## 2017-11-01 DIAGNOSIS — R252 Cramp and spasm: Secondary | ICD-10-CM | POA: Diagnosis not present

## 2017-11-01 DIAGNOSIS — R292 Abnormal reflex: Secondary | ICD-10-CM

## 2017-11-01 DIAGNOSIS — G629 Polyneuropathy, unspecified: Secondary | ICD-10-CM

## 2017-11-01 NOTE — Progress Notes (Signed)
Follow-up Visit   Date: 11/01/17    Kelli Martinez MRN: [id number] DOB: 26-Sep-1945   Interim History: Kelli Martinez is a 72 y.o. right-handed African American female with hypothyroidism, pulmonary embolism, hyperlipidemia, RLS, history of breast cancer, s/p gastric bypass (2011) returning to the clinic for with new complaints of muscle cramps. She was last seen for peripheral neuropathy.  The patient was accompanied to the clinic by self.  History of present illness: She has history of neuropathy affecting the feet for many years, but this has worsened over the past 5 years.  Symptoms started with numbness and tingling involving the feet and over time has involved her lower legs and hands.  She has sensitivity to light touch over the feet.  She takes gabapentin 675m 6 times daily, which provides some relief.  She does not have weakness and has not suffered any falls.  She stumbles at times and walks with a cane as needed.   Starting around 2017, she began having tingling sensation over the left hip and radiate down her leg.  [facility].  MRI lumbar spine from 2008 shows degenerative changes and facet disease at L4-5 and small disc protrusion at L5-S1.  She had not had any updated imaging or tried physical therapy.    She had gastric bypass in 2011 and has postprandial hypoglycemia.  She has history of diabetes, last HbA1c 6.5.  She does not drink alcohol and there is no family history of neuropathy.  In early 2018, she was started on nortriptyline which helped her neuropathy, but developed extreme dry eyes, dry mouth, and constipation.  Therefore, this was discontinued and symptoms were controlled on gabapentin 3 times daily alone.  She had one ER visit in July because of taking too much baclofen and now takes 168mTID for muscle cramps, which helps.  UPDATE 11/01/2017:  She is here for sooner than scheduled follow-up  because of new complains of muscle cramps.  She reports having muscle cramps affecting the neck, arms, and legs which was followed by Dr. KuDwyane Dee Cramps are worse at night.  She has previously tried quinine, magnesnium, and has tried tonic water.  She currently takes baclofen 1055mwice daily with an extra 81m42m bedtime, which helps.  She also complains of leg weakness and low back pain.  Her last ESI was two years ago by Dr. RamoNelva Bush Medications:  Current Outpatient Medications on File Prior to Visit  Medication Sig Dispense Refill  . acarbose (PRECOSE) 25 MG tablet TAKE 1 TABLET THREE TIMES A DAY 270 tablet 1  . albuterol (PROAIR HFA) 108 (90 Base) MCG/ACT inhaler USE 2 INHALATIONS FOUR TIMES A DAY AS NEEDED ( AS NEEDED RESCUE INHALER) 54 g 3  . atorvastatin (LIPITOR) 20 MG tablet TAKE 1 TABLET DAILY (Patient taking differently: TAKE 1/2 TABLET DAILY) 90 tablet 2  . baclofen (LIORESAL) 10 MG tablet Take 10 mg by mouth 2 (two) times daily.    . baclofen (LIORESAL) 20 MG tablet TAKE 1 TABLET 3 TIMES A DAY (Patient taking differently: 20 mg at bedtime. TAKE 1 TABLET 3 TIMES A DAY) 90 tablet 1  . Blood Glucose Monitoring Suppl (FREESTYLE FREEDOM LITE) W/DEVICE KIT use as directed 1 each 0  . Blood Glucose Monitoring Suppl (FREESTYLE LITE) DEVI Use to check blood sugar once a day DX code E11.65 1 each 0  . Calcium Carbonate-Vitamin D (CALTRATE 600+D)  600-400 MG-UNIT tablet Take 1 tablet by mouth daily.    . clobetasol cream (TEMOVATE) 7.09 % Apply 1 application topically 2 (two) times daily. 45 g 1  . diazepam (DIASTAT ACUDIAL) 10 MG GEL Place 5 mg rectally once as needed (cramping). 2 Package 0  . ferrous sulfate 325 (65 FE) MG tablet Take 325 mg by mouth daily.      . fluticasone (FLONASE) 50 MCG/ACT nasal spray Place 2 sprays into both nostrils daily. 48 g 3  . Fluticasone-Salmeterol (ADVAIR DISKUS) 100-50 MCG/DOSE AEPB USE 1 INHALATION EVERY 12 HOURS, RINSE MOUTH 180 each 3  . FREESTYLE LITE  test strip USE AS INSTRUCTED TO CHECK BLOOD SUGAR ONCE DAILY 100 each 1  . furosemide (LASIX) 40 MG tablet TAKE 1 TABLET EVERY MORNING 90 tablet 1  . gabapentin (NEURONTIN) 300 MG capsule Take 2 capsules (600 mg total) by mouth 3 (three) times daily. 540 capsule 3  . HYDROcodone-acetaminophen (NORCO/VICODIN) 5-325 MG tablet Take 1 tablet by mouth every 4 (four) hours as needed for moderate pain.    . Lancets (FREESTYLE) lancets USE AS INSTRUCTED TO CHECK BLOOD SUGAR ONCE DAILY 100 each 1  . levothyroxine (SYNTHROID, LEVOTHROID) 125 MCG tablet TAKE 1 TABLET DAILY BEFORE BREAKFAST 90 tablet 1  . magnesium chloride (SLOW-MAG) 64 MG TBEC SR tablet Take 1 tablet (64 mg total) by mouth 2 (two) times daily. 60 tablet 6  . metFORMIN (GLUCOPHAGE) 500 MG tablet Take 1 tablet (500 mg total) by mouth at bedtime. 90 tablet 3  . methylcellulose (ARTIFICIAL TEARS) 1 % ophthalmic solution Place 1 drop into both eyes as needed. Dry eyes    . NON FORMULARY CPAP 6 [employer]    . omeprazole (PRILOSEC) 20 MG capsule TAKE 1 CAPSULE DAILY 90 capsule 0  . spironolactone (ALDACTONE) 50 MG tablet TAKE 1 TABLET TWICE A DAY 180 tablet 1  . temazepam (RESTORIL) 15 MG capsule 1-2 for sleep as needed 90 capsule 1  . traMADol (ULTRAM) 50 MG tablet Take 1 tablet (50 mg total) by mouth every 12 (twelve) hours as needed. 30 tablet 0  . vitamin B-12 (CYANOCOBALAMIN) 500 MCG tablet Take 500 mcg by mouth daily.      . Vitamin D, Ergocalciferol, (DRISDOL) 50000 units CAPS capsule TAKE 1 CAPSULE EVERY FRIDAY 12 capsule 1  . warfarin (COUMADIN) 5 MG tablet Take 2 tablets daily except 3 tablets on Mondays only OR AS DIRECTED BY ANTICOAGULATION CLINIC  (90 DAY SUPPLY) 205 tablet 1   No current facility-administered medications on file prior to visit.     Allergies:  Allergies  Allergen Reactions  . Adhesive [Tape] Rash    Review of Systems:  CONSTITUTIONAL: No fevers, chills, night sweats, or weight loss.  EYES: No visual changes  or eye pain ENT: No hearing changes.  No history of nose bleeds.   RESPIRATORY: No cough, wheezing and shortness of breath.   CARDIOVASCULAR: Negative for chest pain, and palpitations.   GI: Negative for abdominal discomfort, blood in stools or black stools.  No recent change in bowel habits.   GU:  No history of incontinence.   MUSCLOSKELETAL: +history of joint pain or swelling.  No myalgias.   SKIN: Negative for lesions, rash, and itching.   ENDOCRINE: Negative for cold or heat intolerance, polydipsia or goiter.   PSYCH:  No depression or anxiety symptoms.   NEURO: As Above.   Vital Signs:  BP 104/70   Pulse 72   Ht _0  (1.753 m)  Wt 233 lb 4 oz (105.8 kg)   SpO2 100%   BMI 34.45 kg/m   General Medical Exam:   General:  Well appearing, comfortable  Eyes/ENT: see cranial nerve examination.   Neck: No masses appreciated.  Full range of motion without tenderness.  No carotid bruits. Respiratory:  Clear to auscultation, good air entry bilaterally.   Cardiac:  Regular rate and rhythm, no murmur.   Ext:  Pitting edema in the legs bilaterally  Neurological Exam: MENTAL STATUS including orientation to time, place, person, recent and remote memory, attention span and concentration, language, and fund of knowledge is normal.  Speech is not dysarthric.  CRANIAL NERVES:  Extraocular muscles intact.  Pupils are round and reactive to light.  Face is symmetric.  MOTOR:  Motor strength is 5/5 in all extremities, including intrinsic hand muscles. There is no percussion or grip myotonia.   MSRs:  Right                                                                 Left brachioradialis 2+  brachioradialis 2+  biceps 2+  biceps 2+  triceps 2+  triceps 2+  patellar tr  Patellar tr  ankle jerk 0  ankle jerk 0   SENSORY:  Gradient pattern of sensory less distal to knees and hands bilaterally to pin prick, temperature, light touch, and vibration.  COORDINATION/GAIT:   Gait narrow based  and stable.   Data: Labs 10/09/2016:  ESR 29, CRP 0.1, ANA neg, SSA/B neg, vitamin B12 936, vitamin B1 10, copper 186, folate 12.6  Lab Results  Component Value Date   TSH 2.81 09/17/2017   Lab Results  Component Value Date   VITAMINB12 936 (H) 10/09/2016   Lab Results  Component Value Date   CREATININE 0.83 10/25/2017   BUN 15 10/25/2017   NA 139 10/25/2017   K 4.0 10/25/2017   CL 103 10/25/2017   CO2 31 10/25/2017     IMPRESSION/PLAN: 1.  Muscle cramps - need to evaluation for cervical canal stenosis as her reflexes are brisk in the upper extremities. Electrolytes are within normal limits.  She takes diuretics for swelling which can potentially also contribute to symptoms.   - MRI cervical spine wo contrast  - At this time, her cramps are well-controlled on baclofen 67m in the morning and afternoon, 259mat bedtime.  This can be titrated further, if needed.  - Encouraged to stay well-hydrated  2.  Bilateral leg swelling, unclear if this is due to gabapentin.  Offered to reduce gabapentin to 3006mhree times daily, if pain gets worse she can go back to taking 600m13mree times daily.  3.  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  Stable.  She did not tolerate nortriptyline due to anticholinergic side effects.   Return to clinic in 6 months   Thank you for allowing me to participate in patient's care.  If I can answer any additional questions, I would be pleased to do so.    Sincerely,    Kikuye Korenek K. PatePosey Pronto

## 2017-11-01 NOTE — Patient Instructions (Addendum)
MRI cervical spine wo contrast  Reduce gabapentin to 300mg  three times daily to see if this helps with your leg swelling.  If your pain gets worse, you can go back to taking 2 tablets three times daily  Continue baclofen 10mg  in the morning, afternoon, and 20mg  at bedtime  Stay well-hydrated

## 2017-11-06 ENCOUNTER — Ambulatory Visit: Payer: Medicare Other | Admitting: Neurology

## 2017-11-06 DIAGNOSIS — M21622 Bunionette of left foot: Secondary | ICD-10-CM | POA: Diagnosis not present

## 2017-11-08 ENCOUNTER — Telehealth: Payer: Self-pay | Admitting: Neurology

## 2017-11-08 NOTE — Telephone Encounter (Signed)
Called Pt, provided her with [facility] to schedule.

## 2017-11-08 NOTE — Telephone Encounter (Signed)
Patient called to check the status of her MRI being scheduled for her Neck. Please Call. Thanks

## 2017-11-13 ENCOUNTER — Other Ambulatory Visit: Payer: Self-pay | Admitting: Endocrinology

## 2017-11-20 ENCOUNTER — Encounter: Payer: Self-pay | Admitting: Endocrinology

## 2017-11-20 ENCOUNTER — Ambulatory Visit (INDEPENDENT_AMBULATORY_CARE_PROVIDER_SITE_OTHER): Payer: Medicare Other | Admitting: Endocrinology

## 2017-11-20 VITALS — BP 102/68 | HR 64 | Ht 69.0 in | Wt 237.4 lb

## 2017-11-20 DIAGNOSIS — E1142 Type 2 diabetes mellitus with diabetic polyneuropathy: Secondary | ICD-10-CM

## 2017-11-20 DIAGNOSIS — G609 Hereditary and idiopathic neuropathy, unspecified: Secondary | ICD-10-CM | POA: Diagnosis not present

## 2017-11-20 DIAGNOSIS — E063 Autoimmune thyroiditis: Secondary | ICD-10-CM | POA: Diagnosis not present

## 2017-11-20 DIAGNOSIS — I872 Venous insufficiency (chronic) (peripheral): Secondary | ICD-10-CM

## 2017-11-20 LAB — GLUCOSE, POCT (MANUAL RESULT ENTRY): POC GLUCOSE: 108 mg/dL — AB (ref 70–99)

## 2017-11-20 MED ORDER — CLOBETASOL PROPIONATE 0.05 % EX CREA: 1.0000 "application " | TOPICAL_CREAM | Freq: Two times a day (BID) | CUTANEOUS | 1 refills | Status: DC

## 2017-11-20 NOTE — Progress Notes (Signed)
Patient ID: Kelli Martinez, female   DOB: [birth year], 72 y.o.   MRN: [id number]    Chief complaint: Followup of various issues   History of Present Illness:  The patient is seen for the following problems:  1.  SWELLING of the legs:  She thinks her leg swelling has been much more prominent lately and may be uncomfortable also Swelling may be also sometimes present in the morning but usually worse later in the day  Has had variable control with 50 mg bid of Aldactone along with Lasix every other day Lasix has been limited because of tendency to low blood pressure  She has again not been using the compression stockings which she has because they do not stay up, these are thigh-high stockings Also had not been trying to get any new ones as prescribed because of cost   The next 2 problems were not addressed in the office today:  2.  History of diabetes:   This has been mild since her gastric bypass surgery  Usually has a normal A1c readings around 6.2-6.6 Overall blood sugars have been Reasonably controlled since her bariatric surgery without any medications.  However more recently her A1c has been 6.8-6.9, now 6.8  Her recent glucose in the lab was 125 fasting, however had a steroid injection from her allergist about 2 weeks prior to that When A1c was higher she was started on  She continues on METFORMIN 500 mg at bedtime that was started when her A1c was higher Does not appear to have high readings after meals by history although not taking much in the evening She is taking Precose mostly to prevent reactive hypoglycemia  Blood sugar readings: See above She is checking blood sugars on an average less than once a day  Recently not doing any specific exercise recently except housework    Wt Readings from Last 3 Encounters:  11/20/17 237 lb 6.4 oz (107.7 kg)  11/01/17 233 lb 4 oz (105.8 kg)  09/21/17 231 lb 1.6 oz (104.8 kg)    Lab Results  Component Value  Date   HGBA1C 6.8 (H) 09/17/2017   HGBA1C 6.4 06/11/2017   HGBA1C 6.9 (H) 03/12/2017   Lab Results  Component Value Date   MICROALBUR <0.7 08/01/2016   LDLCALC 79 09/17/2017   CREATININE 0.83 10/25/2017     PROBLEM 3:  Postprandial hypoglycemia related to dumping syndrome   She has had postprandial hypoglycemia starting a couple of years after her gastric bypass surgery Her symptoms of blood sugars are mostly a feeling of significant weakness, some shakiness.  She will use fruits or juice in order to relieve the symptoms She has been to the dietitian and has been instructed on balanced low-fat meals with enough protein consistently and restricting carbohydrates including fruits   Recent history:  She has been treated with acarbose with reduction in frequency of hypoglycemia  She did try verapamil empirically but this did not help.  She is taking her Precose usually right before eating, 1 tablet at breakfast 2 at lunch and 1 at dinner  Recent blood sugars and management:  She has had again some tendency to low sugars after lunch although she has not documented these all the time and did not bring her monitor  She also thinks that she feels symptomatic when blood sugars are around 80 when she feels somewhat weak, dizzy and feels better with eating or drinking something with simple sugars, she thinks she tends to over treat  the low sugar and subsequently it may be as high as 250  Her low sugars have been in the upper 50s  However even though she had been previously instructed on diet she is eating 2 slices of bread at lunchtime and not consistently reducing carbohydrates at lunch  Not symptomatic at any other time  She is getting protein with every meal usually  She has gained gone up 4 pounds and not able to consistently lose any  She is not always able to exercise with various issues      Other active problems: see review of systems      Allergies as of 11/20/2017        Reactions   Adhesive [tape] Rash      Medication List        Accurate as of 11/20/17  8:47 PM. Always use your most recent med list.          acarbose 25 MG tablet Commonly known as:  PRECOSE TAKE 1 TABLET THREE TIMES A DAY   albuterol 108 (90 Base) MCG/ACT inhaler Commonly known as:  PROAIR HFA USE 2 INHALATIONS FOUR TIMES A DAY AS NEEDED ( AS NEEDED RESCUE INHALER)   atorvastatin 20 MG tablet Commonly known as:  LIPITOR TAKE 1 TABLET DAILY   baclofen 10 MG tablet Commonly known as:  LIORESAL Take 10 mg by mouth 2 (two) times daily.   baclofen 20 MG tablet Commonly known as:  LIORESAL TAKE 1 TABLET 3 TIMES A DAY   CALTRATE 600+D 600-400 MG-UNIT tablet Generic drug:  Calcium Carbonate-Vitamin D Take 1 tablet by mouth daily.   clobetasol cream 0.05 % Commonly known as:  TEMOVATE Apply 1 application topically 2 (two) times daily.   diazepam 10 MG Gel Commonly known as:  DIASTAT ACUDIAL Place 5 mg rectally once as needed (cramping).   ferrous sulfate 325 (65 FE) MG tablet Take 325 mg by mouth daily.   fluticasone 50 MCG/ACT nasal spray Commonly known as:  FLONASE Place 2 sprays into both nostrils daily.   Fluticasone-Salmeterol 100-50 MCG/DOSE Aepb Commonly known as:  ADVAIR DISKUS USE 1 INHALATION EVERY 12 HOURS, RINSE MOUTH   freestyle lancets USE AS INSTRUCTED TO CHECK BLOOD SUGAR ONCE DAILY   FREESTYLE LITE Devi Use to check blood sugar once a day DX code E11.65   FREESTYLE FREEDOM LITE w/Device Kit use as directed   FREESTYLE LITE test strip Generic drug:  glucose blood USE AS INSTRUCTED TO CHECK BLOOD SUGAR ONCE DAILY   furosemide 40 MG tablet Commonly known as:  LASIX TAKE 1 TABLET EVERY MORNING   gabapentin 300 MG capsule Commonly known as:  NEURONTIN Take 2 capsules (600 mg total) by mouth 3 (three) times daily.   HYDROcodone-acetaminophen 5-325 MG tablet Commonly known as:  NORCO/VICODIN Take 1 tablet by mouth every 4 (four)  hours as needed for moderate pain.   levothyroxine 125 MCG tablet Commonly known as:  SYNTHROID, LEVOTHROID TAKE 1 TABLET DAILY BEFORE BREAKFAST   magnesium chloride 64 MG Tbec SR tablet Commonly known as:  SLOW-MAG Take 1 tablet (64 mg total) by mouth 2 (two) times daily.   metFORMIN 500 MG tablet Commonly known as:  GLUCOPHAGE Take 1 tablet (500 mg total) by mouth at bedtime.   methylcellulose 1 % ophthalmic solution Commonly known as:  ARTIFICIAL TEARS Place 1 drop into both eyes as needed. Dry eyes   NON FORMULARY CPAP 6 [employer]   omeprazole 20 MG capsule Commonly known as:  PRILOSEC TAKE 1 CAPSULE DAILY   spironolactone 50 MG tablet Commonly known as:  ALDACTONE TAKE 1 TABLET TWICE A DAY   temazepam 15 MG capsule Commonly known as:  RESTORIL 1-2 for sleep as needed   traMADol 50 MG tablet Commonly known as:  ULTRAM Take 1 tablet (50 mg total) by mouth every 12 (twelve) hours as needed.   vitamin B-12 500 MCG tablet Commonly known as:  CYANOCOBALAMIN Take 500 mcg by mouth daily.   Vitamin D (Ergocalciferol) 50000 units Caps capsule Commonly known as:  DRISDOL TAKE 1 CAPSULE EVERY FRIDAY   warfarin 5 MG tablet Commonly known as:  COUMADIN Take as directed by the anticoagulation clinic. If you are unsure how to take this medication, talk to your nurse or doctor. Original instructions:  Take 2 tablets daily except 3 tablets on Mondays only OR AS DIRECTED BY ANTICOAGULATION CLINIC  (90 DAY SUPPLY)       Allergies:  Allergies  Allergen Reactions  . Adhesive [Tape] Rash    Past Medical History:  Diagnosis Date  . Allergy   . Anemia   . Arthritis   . Asthma   . Back pain, chronic    GETS INJECTIONS IN BACK  . Blind right eye    hemorrhage  . Blood transfusion   . Cancer ([place])    breast 1994  . Clotting disorder ([place])   . Diabetes mellitus without complication ([place])    takes precose  . Elevated liver enzymes   . Gallstones   . GERD  (gastroesophageal reflux disease)   . HX: breast cancer   . Hyperlipidemia   . Hypertension   . Hypothyroid   . Lymphedema of arm    RT  . Morbid obesity ([place])   . OSA (obstructive sleep apnea)    has not used in 2 years-lost weight  . Osteoporosis   . Peripheral neuropathy    on gabapentin  . PONV (postoperative nausea and vomiting)   . Psoriasis   . Pulmonary embolism ([place]) 1998 / 1994 /1968  . Syncope and collapse 2011   due to anemia    Past Surgical History:  Procedure Laterality Date  . ABDOMINAL HYSTERECTOMY    . BONE MARROW TRANSPLANT  1994  . CARDIOVASCULAR STRESS TEST  05/23/2005   EF 53%  . carpal tunnell     bil  . cataracts    . CHOLECYSTECTOMY  05/10/2012   Procedure: LAPAROSCOPIC CHOLECYSTECTOMY WITH INTRAOPERATIVE CHOLANGIOGRAM;  Surgeon: Pedro Earls, MD;  Location: [facility];  Service: General;  Laterality: N/A;  Laparoscopic Cholecystectomy with Intraoperative Cholangiogram  . GASTRIC BYPASS  2011   bariatric surgery  . HIATAL HERNIA REPAIR    . IVC filter     recurrent DVT  . KNEE ARTHROTOMY  1998  . MASS EXCISION Right 11/24/2016   Procedure: EXCISION RIGHT AXILLARY LESION AND CHEST WALL LESION;  Surgeon: Johnathan Hausen, MD;  Location: [facility];  Service: General;  Laterality: Right;  . MASTECTOMY     Right  . US ECHOCARDIOGRAPHY  10/27/08   EF 55-60%    Family History  Problem Relation Age of Onset  . Sudden death Mother        car accident  . Cancer Father 31       lung  . Cancer Sister 66       lung cancer  . Asthma Daughter     Social History:  reports that she quit smoking about 39 years  ago. Her smoking use included cigarettes. She has a 5.00 pack-year smoking history. She has never used smokeless tobacco. She reports that she does not drink alcohol or use drugs.  Review of Systems   ANTICOAGULATION: Her Coumadin has been followed regularly in the clinic INR results as follows  Lab Results  Component Value Date    INR 2.3 10/29/2017   INR 2.7 10/02/2017   INR 1.3 09/25/2017     Neuropathy with burning, some numbness and lower leg pain:  She gets some relief with nortriptyline, also on gabapentin   MUSCLE cramps/spasms: She is being treated with 10 mg baclofen during the day twice daily and 20 mg at night Recently had more problems but is doing better with taking an extra baclofen during the day as needed She also uses tonic water, mustard and magnesium supplements without much relief Has seen the neurologist also for this problem and because of the spasms being in her neck and occasionally associated with feeling of dizziness she is going to get an MRI    Lymphedema right arm, secondary to  breast surgery, using  elastic compression stocking, needs another one    History of mild hypothyroidism: This has been controlled with 62.5 mcg ,  has recent normal TSH   Lab Results  Component Value Date   TSH 2.81 09/17/2017    Hypercholesterolemia: LDL has been at target with Lipitor 10 mg Taking this also for cardiovascular prophylaxis  Lipid levels:  Lab Results  Component Value Date   CHOL 176 09/17/2017   HDL 87.30 09/17/2017   LDLCALC 79 09/17/2017   TRIG 51.0 09/17/2017   CHOLHDL 2 09/17/2017    Previous history of hypertension: Blood pressure low normal because of diuretics   BP Readings from Last 3 Encounters:  11/20/17 102/68  11/01/17 104/70  09/21/17 101/60       LABS:  Office Visit on 11/20/2017  Component Date Value Ref Range Status  . POC Glucose 11/20/2017 108* 70 - 99 mg/dl Corrected  Anti-coag visit on 10/29/2017  Component Date Value Ref Range Status  . INR 10/29/2017 2.3   Final  Lab on 10/25/2017  Component Date Value Ref Range Status  . Magnesium 10/25/2017 2.0  1.5 - 2.5 mg/dL Final  . Sodium 10/25/2017 139  135 - 145 mEq/L Final  . Potassium 10/25/2017 4.0  3.5 - 5.1 mEq/L Final  . Chloride 10/25/2017 103  96 - 112 mEq/L Final  . CO2 10/25/2017  31  19 - 32 mEq/L Final  . Glucose, Bld 10/25/2017 115* 70 - 99 mg/dL Final  . BUN 10/25/2017 15  6 - 23 mg/dL Final  . Creatinine, Ser 10/25/2017 0.83  0.40 - 1.20 mg/dL Final  . Calcium 10/25/2017 9.1  8.4 - 10.5 mg/dL Final  . GFR 10/25/2017 87.44  >60.00 mL/min Final     EXAM:  BP 102/68 (BP Location: Left Arm, Patient Position: Sitting, Cuff Size: Large)   Pulse 64   Ht 5' 9"  (1.753 m)   Wt 237 lb 6.4 oz (107.7 kg)   SpO2 97%   BMI 35.06 kg/m   Diabetic Foot Exam - Simple   Simple Foot Form Diabetic Foot exam was performed with the following findings:  Yes   Visual Inspection See comments:  Yes Sensation Testing See comments:  Yes Pulse Check Posterior Tibialis and Dorsalis pulse intact bilaterally:  Yes See comments:  Yes Comments Monofilament sensation practically absent distally in the toes both on plantar and dorsal  surfaces Pedal pulses decreased bilaterally and difficult to palpate posterior tibialis because of edema Bony deformities of distal toes present    Minimal pitting edema on the right and mild on the left especially on the shin areas, patient has tenderness to touch  Assessment/Plan:   1.  EDEMA: This is related to venous insufficiency and discussed that since her blood pressure is low normal would like like to increase her diuretics She will try to go to the medical supply store and try to get the thigh-high stockings refitted.  Alternatively also given her prescription for knee-high stockings to try also   2.  Neuropathy and numbness: Her podiatrist wants her to have special shoes and will review the order when available  3. Mild diabetes: Needs follow-up A1c which was last higher at 6.8 despite continuing low dose metformin along with Precose  Will review her home monitoring and A1c on the next visit since she did not bring her monitor today  4.   Muscle cramps: Controlled with baclofen recently and will continue to follow up with the neurologist  also  7.  Hypercholesterolemia: To have regular follow-up in the next visit  8.  Mild hypothyroidism: To have labs checked again on the next visit  There are no Patient Instructions on file for this visit.  Elayne Snare 11/20/2017, 8:47 PM

## 2017-11-21 ENCOUNTER — Ambulatory Visit
Admission: RE | Admit: 2017-11-21 | Discharge: 2017-11-21 | Disposition: A | Discharge status: Home / Self Care | Payer: Medicare Other | Source: Ambulatory Visit | Attending: Neurology | Admitting: Neurology

## 2017-11-21 DIAGNOSIS — R292 Abnormal reflex: Secondary | ICD-10-CM

## 2017-11-21 DIAGNOSIS — R252 Cramp and spasm: Secondary | ICD-10-CM

## 2017-11-21 DIAGNOSIS — M542 Cervicalgia: Secondary | ICD-10-CM

## 2017-11-22 ENCOUNTER — Other Ambulatory Visit: Payer: Self-pay | Admitting: Endocrinology

## 2017-11-23 ENCOUNTER — Telehealth: Payer: Self-pay | Admitting: *Deleted

## 2017-11-23 NOTE — Telephone Encounter (Signed)
Left message giving patient results and instructions.   

## 2017-11-23 NOTE — Telephone Encounter (Signed)
-----   Message from Kelli Berthold, DO sent at 11/22/2017 12:25 PM EDT ----- Please inform patient that her MRI looks good, there are some age-related changes, but no findings of nerve impingement or compression which would cause her cramps.  Continue baclofen as she is taking for cramps.

## 2017-11-27 ENCOUNTER — Ambulatory Visit (INDEPENDENT_AMBULATORY_CARE_PROVIDER_SITE_OTHER): Payer: Medicare Other | Admitting: General Practice

## 2017-11-27 DIAGNOSIS — Z7901 Long term (current) use of anticoagulants: Secondary | ICD-10-CM | POA: Diagnosis not present

## 2017-11-27 DIAGNOSIS — M5416 Radiculopathy, lumbar region: Secondary | ICD-10-CM | POA: Diagnosis not present

## 2017-11-27 LAB — POCT INR: INR: 2.9

## 2017-11-27 NOTE — Patient Instructions (Addendum)
Pre visit review using our clinic review tool, if applicable. No additional management support is needed unless otherwise documented below in the visit note. Continue to take 2 tablets daily except 3 tablets on Mondays. Re-check in 4 weeks.

## 2017-12-01 ENCOUNTER — Other Ambulatory Visit: Payer: Self-pay | Admitting: Endocrinology

## 2017-12-04 DIAGNOSIS — M10079 Idiopathic gout, unspecified ankle and foot: Secondary | ICD-10-CM | POA: Diagnosis not present

## 2017-12-04 DIAGNOSIS — I890 Lymphedema, not elsewhere classified: Secondary | ICD-10-CM | POA: Diagnosis not present

## 2017-12-04 DIAGNOSIS — Z79899 Other long term (current) drug therapy: Secondary | ICD-10-CM | POA: Diagnosis not present

## 2017-12-04 DIAGNOSIS — R936 Abnormal findings on diagnostic imaging of limbs: Secondary | ICD-10-CM | POA: Diagnosis not present

## 2017-12-04 DIAGNOSIS — M25572 Pain in left ankle and joints of left foot: Secondary | ICD-10-CM | POA: Diagnosis not present

## 2017-12-04 DIAGNOSIS — M25571 Pain in right ankle and joints of right foot: Secondary | ICD-10-CM | POA: Diagnosis not present

## 2017-12-06 DIAGNOSIS — M1711 Unilateral primary osteoarthritis, right knee: Secondary | ICD-10-CM | POA: Diagnosis not present

## 2017-12-06 DIAGNOSIS — M25561 Pain in right knee: Secondary | ICD-10-CM | POA: Diagnosis not present

## 2017-12-06 DIAGNOSIS — M25562 Pain in left knee: Secondary | ICD-10-CM | POA: Diagnosis not present

## 2017-12-06 DIAGNOSIS — M1712 Unilateral primary osteoarthritis, left knee: Secondary | ICD-10-CM | POA: Diagnosis not present

## 2017-12-11 DIAGNOSIS — I890 Lymphedema, not elsewhere classified: Secondary | ICD-10-CM | POA: Diagnosis not present

## 2017-12-11 DIAGNOSIS — M25571 Pain in right ankle and joints of right foot: Secondary | ICD-10-CM | POA: Diagnosis not present

## 2017-12-11 DIAGNOSIS — M25572 Pain in left ankle and joints of left foot: Secondary | ICD-10-CM | POA: Diagnosis not present

## 2017-12-13 DIAGNOSIS — M5416 Radiculopathy, lumbar region: Secondary | ICD-10-CM | POA: Diagnosis not present

## 2017-12-17 ENCOUNTER — Other Ambulatory Visit: Payer: Self-pay | Admitting: Endocrinology

## 2017-12-18 DIAGNOSIS — M65871 Other synovitis and tenosynovitis, right ankle and foot: Secondary | ICD-10-CM | POA: Diagnosis not present

## 2017-12-18 DIAGNOSIS — M65872 Other synovitis and tenosynovitis, left ankle and foot: Secondary | ICD-10-CM | POA: Diagnosis not present

## 2017-12-18 DIAGNOSIS — I890 Lymphedema, not elsewhere classified: Secondary | ICD-10-CM | POA: Diagnosis not present

## 2017-12-18 DIAGNOSIS — M7671 Peroneal tendinitis, right leg: Secondary | ICD-10-CM | POA: Diagnosis not present

## 2017-12-20 ENCOUNTER — Other Ambulatory Visit: Payer: Self-pay | Admitting: Endocrinology

## 2017-12-25 ENCOUNTER — Ambulatory Visit (INDEPENDENT_AMBULATORY_CARE_PROVIDER_SITE_OTHER): Payer: Medicare Other | Admitting: General Practice

## 2017-12-25 ENCOUNTER — Telehealth: Payer: Self-pay | Admitting: Endocrinology

## 2017-12-25 ENCOUNTER — Other Ambulatory Visit: Payer: Self-pay

## 2017-12-25 DIAGNOSIS — Z7901 Long term (current) use of anticoagulants: Secondary | ICD-10-CM | POA: Diagnosis not present

## 2017-12-25 LAB — POCT INR: INR: 2.7 (ref 2.0–3.0)

## 2017-12-25 MED ORDER — FREESTYLE FREEDOM LITE W/DEVICE KIT: PACK | 0 refills | Status: DC

## 2017-12-25 NOTE — Telephone Encounter (Signed)
Patient's Accucheck Freestyle Meter broke-it no longer works. Please send RX for new one to [facility] asap.

## 2017-12-25 NOTE — Patient Instructions (Addendum)
Pre visit review using our clinic review tool, if applicable. No additional management support is needed unless otherwise documented below in the visit note.  Continue to take 2 tablets daily except 3 tablets on Mondays. Re-check in 6 weeks.

## 2017-12-25 NOTE — Telephone Encounter (Signed)
Medication sent to pharmacy per patient request.  ?

## 2017-12-26 ENCOUNTER — Telehealth: Payer: Self-pay | Admitting: Internal Medicine

## 2017-12-26 NOTE — Progress Notes (Signed)
_0  ID: Kelli Martinez, female    DOB: July 14, 1946, 72 y.o.   MRN: [id number]  Chief Complaint  Patient presents with  . Acute Visit    cough with green and bloody mucous, increased SOB, wheezing, and sinus congestion since monday.     Referring provider: Elayne Snare, MD  HPI: 72 year old female former smoker followed for obstructive sleep apnea (quit CPAP), allergic rhinitis, asthma History of PE/DVT/filter/Coumadin (Dr. Dwyane Dee), history of right breast CA/mastectomy/XRT/chemo/bone marrow transplant at [facility] hx gastric bypass  Recent [place] [facility] Encounters:   08/31/2016-70 yoF former smoker followed for OSA/ quit CPAP, allergic rhinitis,asthma,  hx pulm embolism/DVT/filter/coumadin(Dr Dwyane Dee), hx R breast Ca/ mastectomy/ xrt/chem/bonemarrow transplant at [place], hx gastric bypass FOLLOWS FOR:  Pt is not wearing CPAP machine, pt states that she had trouble sleeping with it and was waking several times per night. Pt also c/o nasal and throat dryness.  Occasional minimal chest tightness and dry cough with scant clear mucus. Using rescue inhaler 2 or 3 times a week. Continues Advair. Flonase helps her nasal symptoms with some stuffiness. Seeing neurology about "unsteadiness" with feeling that "head is in a vise". Not clear if she is describing eustachian dysfunction with vertigo. Continues temazepam which has helped a lot, well tolerated for insomnia.  08/31/17- 71 yoF former smoker followed for OSA/ quit CPAP, insomnia, allergic rhinitis,asthma,  hx pulm embolism/DVT/filter/coumadin(Dr Dwyane Dee), hx R breast Ca/ mastectomy/ xrt/chem/bonemarrow transplant at [facility], hx gastric bypass ----yearly ROV, sinus congestion Needs meds refilled.  Uses rescue inhaler every few days with little sleep disturbance. Woke 2 days ago with sore throat, progressive head congestion, bilateral maxillary pain and pressure, active postnasal drip.  No fever or adenopathy, cough or wheeze.   Tests:  Office  Spirometry 04/27/2015-WNL-FEV1/FVC 0.81, FEV1 2.50/110%   Imaging:  CXR 08/28/17-No active cardiopulmonary disease. 11/05/2014-CT chest with contrast- status post right mastectomy with right axillary lymph node dissection, radiation changes in right upper lobe, no evidence of recurrent or metastases metastatic disease IVC filter    12/26/17 Acute Patient presents today for acute visit.  Symptoms started on 5/27.  Patient reporting congestion, sinus headache, increased sinus pain to palpation, sinus pressure, eustachian tube dysfunction, cough with yellow mucus.  Patient reporting occasional headache as well.  Patient has been compliant with her Advair as well as her flonase.    Allergies  Allergen Reactions  . Penicillins     blisters  . Adhesive [Tape] Rash    Immunization History  Administered Date(s) Administered  . Influenza Split 04/23/2012  . Influenza Whole 03/31/2009  . Influenza, High Dose Seasonal PF 04/07/2015, 04/21/2016, 04/27/2017  . Influenza,inj,Quad PF,6+ Mos 04/21/2014  . Pneumococcal Conjugate-13 07/06/2014  . Pneumococcal Polysaccharide-23 09/06/1998    Past Medical History:  Diagnosis Date  . Allergy   . Anemia   . Arthritis   . Asthma   . Back pain, chronic    GETS INJECTIONS IN BACK  . Blind right eye    hemorrhage  . Blood transfusion   . Cancer ([place])    breast 1994  . Clotting disorder ([place])   . Diabetes mellitus without complication ([place])    takes precose  . Elevated liver enzymes   . Gallstones   . GERD (gastroesophageal reflux disease)   . HX: breast cancer   . Hyperlipidemia   . Hypertension   . Hypothyroid   . Lymphedema of arm    RT  . Morbid obesity ([place])   . OSA (obstructive sleep apnea)  has not used in 2 years-lost weight  . Osteoporosis   . Peripheral neuropathy    on gabapentin  . PONV (postoperative nausea and vomiting)   . Psoriasis   . Pulmonary embolism ([place]) 1998 / 1994 /1968  . Syncope and collapse 2011   due to  anemia    Tobacco History: Social History   Tobacco Use  Smoking Status Former Smoker  . Packs/day: 0.50  . Years: 10.00  . Pack years: 5.00  . Types: Cigarettes  . Last attempt to quit: 07/31/1978  . Years since quitting: 39.4  Smokeless Tobacco Never Used   Counseling given: Not Answered   Outpatient Encounter Medications as of 12/27/2017  Medication Sig  . acarbose (PRECOSE) 25 MG tablet TAKE 1 TABLET THREE TIMES A DAY  . albuterol (PROAIR HFA) 108 (90 Base) MCG/ACT inhaler USE 2 INHALATIONS FOUR TIMES A DAY AS NEEDED ( AS NEEDED RESCUE INHALER)  . atorvastatin (LIPITOR) 20 MG tablet TAKE 1 TABLET DAILY (Patient taking differently: TAKE 1/2 TABLET DAILY)  . baclofen (LIORESAL) 10 MG tablet Take 10 mg by mouth 2 (two) times daily.  . baclofen (LIORESAL) 20 MG tablet TAKE 1 TABLET 3 TIMES A DAY (Patient taking differently: 20 mg at bedtime. TAKE 1 TABLET 3 TIMES A DAY)  . Blood Glucose Monitoring Suppl (FREESTYLE FREEDOM LITE) w/Device KIT use as directed  . Blood Glucose Monitoring Suppl (FREESTYLE LITE) DEVI Use to check blood sugar once a day DX code E11.65  . Calcium Carbonate-Vitamin D (CALTRATE 600+D) 600-400 MG-UNIT tablet Take 1 tablet by mouth daily.  . clobetasol cream (TEMOVATE) 4.09 % Apply 1 application topically 2 (two) times daily.  . diazepam (DIASTAT ACUDIAL) 10 MG GEL Place 5 mg rectally once as needed (cramping).  . ferrous sulfate 325 (65 FE) MG tablet Take 325 mg by mouth daily.    . fluticasone (FLONASE) 50 MCG/ACT nasal spray Place 2 sprays into both nostrils daily.  . Fluticasone-Salmeterol (ADVAIR DISKUS) 100-50 MCG/DOSE AEPB USE 1 INHALATION EVERY 12 HOURS, RINSE MOUTH  . FREESTYLE LITE test strip USE AS INSTRUCTED TO CHECK BLOOD SUGAR ONCE DAILY  . furosemide (LASIX) 40 MG tablet TAKE 1 TABLET EVERY MORNING  . gabapentin (NEURONTIN) 300 MG capsule Take 2 capsules (600 mg total) by mouth 3 (three) times daily.  Marland Kitchen HYDROcodone-acetaminophen (NORCO/VICODIN)  5-325 MG tablet Take 1 tablet by mouth every 4 (four) hours as needed for moderate pain.  . Lancets (FREESTYLE) lancets USE AS INSTRUCTED TO CHECK BLOOD SUGAR ONCE DAILY  . levothyroxine (SYNTHROID, LEVOTHROID) 125 MCG tablet TAKE 1 TABLET DAILY BEFORE BREAKFAST  . magnesium chloride (SLOW-MAG) 64 MG TBEC SR tablet Take 1 tablet (64 mg total) by mouth 2 (two) times daily.  . metFORMIN (GLUCOPHAGE) 500 MG tablet Take 1 tablet (500 mg total) by mouth at bedtime.  . methylcellulose (ARTIFICIAL TEARS) 1 % ophthalmic solution Place 1 drop into both eyes as needed. Dry eyes  . NON FORMULARY CPAP 6 [employer]  . omeprazole (PRILOSEC) 20 MG capsule TAKE 1 CAPSULE DAILY  . spironolactone (ALDACTONE) 50 MG tablet TAKE 1 TABLET TWICE A DAY  . temazepam (RESTORIL) 15 MG capsule 1-2 for sleep as needed  . traMADol (ULTRAM) 50 MG tablet Take 1 tablet (50 mg total) by mouth every 12 (twelve) hours as needed.  . vitamin B-12 (CYANOCOBALAMIN) 500 MCG tablet Take 500 mcg by mouth daily.    . Vitamin D, Ergocalciferol, (DRISDOL) 50000 units CAPS capsule TAKE 1 CAPSULE EVERY FRIDAY  .  warfarin (COUMADIN) 5 MG tablet Take 2 tablets daily except 3 tablets on Mondays only OR AS DIRECTED BY ANTICOAGULATION CLINIC  (90 DAY SUPPLY)  . doxycycline (VIBRA-TABS) 100 MG tablet Take 1 tablet (100 mg total) by mouth 2 (two) times daily.  . predniSONE (DELTASONE) 10 MG tablet 4 tabs for 2 days, then 3 tabs for 2 days, 2 tabs for 2 days, then 1 tab for 2 days, then stop   No facility-administered encounter medications on file as of 12/27/2017.      Review of Systems  Constitutional: +Fatigue   No  weight loss, night sweats,  fevers, chills HEENT:  +HA, nasal congestion, ST, post nasal drip, ear fullness  No Difficulty swallowing,  Tooth/dental problems, No sneezing, itching, ear ache CV: +minor LE swelling No chest pain,  orthopnea, PND, anasarca, dizziness, palpitations, syncope  GI: No heartburn, indigestion, abdominal pain,  nausea, vomiting, diarrhea, change in bowel habits, loss of appetite, bloody stools Resp: +cough with yellow mucous, sob with exertion  No shortness of breath  at rest.  No coughing up of blood.  No change in color of mucus.  No wheezing.  No chest wall deformity Skin: no rash, lesions, no skin changes. GU: no dysuria, change in color of urine, no urgency or frequency.  No flank pain, no hematuria  MS:  No joint pain or swelling.  No decreased range of motion.  No back pain. Psych:  No change in mood or affect. No depression or anxiety.  No memory loss.   Physical Exam  BP 128/82   Pulse 63   Ht _0  (1.753 m)   Wt 230 lb 3.2 oz (104.4 kg)   SpO2 100%   BMI 33.99 kg/m   GEN: A/Ox3; pleasant , NAD, well nourished  HEENT:  [place]/AT,  EACs-clear, TMs- bilaterally effusion without infection, NOSE- erythematous , THROAT- +post nasal drip clear, no lesions, +Tender to palpation sinuses L>R NECK:  Supple w/ fair ROM; no JVD; +bilateral superficial cervical lymphadenopathy.   RESP:  Clear  P & A; w/o, wheezes/ rales/ or rhonchi. no accessory muscle use, no dullness to percussion CARD:  +1 LE edema bilaterally RRR, no m/r/g, pulses intact, no cyanosis or clubbing. GI:   Soft & nt; nml bowel sounds; no organomegaly or masses detected.  Musco: Warm bil, no deformities or joint swelling noted.  Neuro: alert, no focal deficits noted.   Skin: Warm, no lesions or rashes    Lab Results:  CBC    Component Value Date/Time   WBC 5.7 02/23/2017 1105   RBC 4.93 02/23/2017 1105   HGB 13.9 02/23/2017 1105   HGB 12.1 01/19/2016 0906   HCT 40.9 02/23/2017 1105   HCT 37.7 01/19/2016 0906   PLT 211 02/23/2017 1105   PLT 184 01/19/2016 0906   MCV 83.0 02/23/2017 1105   MCV 89.0 01/19/2016 0906   MCH 28.2 02/23/2017 1105   MCHC 34.0 02/23/2017 1105   RDW 14.5 02/23/2017 1105   RDW 14.0 01/19/2016 0906   LYMPHSABS 1.8 02/23/2017 1105   LYMPHSABS 1.2 01/19/2016 0906   MONOABS 0.3 02/23/2017 1105    MONOABS 0.2 01/19/2016 0906   EOSABS 0.0 02/23/2017 1105   EOSABS 0.1 01/19/2016 0906   BASOSABS 0.0 02/23/2017 1105   BASOSABS 0.1 01/19/2016 0906    BMET    Component Value Date/Time   NA 139 10/25/2017 1040   NA 141 01/19/2016 0906   K 4.0 10/25/2017 1040   K 4.2 01/19/2016 0906  CL 103 10/25/2017 1040   CL 102 10/29/2012 1014   CO2 31 10/25/2017 1040   CO2 25 01/19/2016 0906   GLUCOSE 115 (H) 10/25/2017 1040   GLUCOSE 104 01/19/2016 0906   GLUCOSE 95 10/29/2012 1014   BUN 15 10/25/2017 1040   BUN 15.4 01/19/2016 0906   CREATININE 0.83 10/25/2017 1040   CREATININE 0.9 01/19/2016 0906   CALCIUM 9.1 10/25/2017 1040   CALCIUM 9.5 01/19/2016 0906   GFRNONAA >60 02/23/2017 1105   GFRAA >60 02/23/2017 1105    BNP No results found for: BNP  ProBNP    Component Value Date/Time   PROBNP 51.8 01/05/2010 0405    Imaging: No results found.   Assessment & Plan:   Pleasant 72 year old patient presents today with sinus pain and nasal congestion.  Patient diagnosed with acute sinusitis patient with a history of chronic sinusitis.  Patient be treated with doxycycline 100 mg twice a day for 7 days, as well as given a prednisone taper.  Of note patient states that they have taken amoxicillin in the past with no reaction.  Given allergies on file we will do doxycycline today.  Patient educated on importance of following up with Coumadin clinic, patient reports she will call today.  Patient also given nasal saline rinse samples and encouraged to continue Flonase as well as her other medication regimens.  Maxillary sinusitis, acute Doxycycline 100 mg twice a day for 7 days >>>take with food  >>> Call Coumadin clinic to notify them that you are starting doxycycline.  That way they are aware.  Starting antibiotic such as this with Coumadin needs to have close monitoring with the Coumadin clinic.  Prednisone >>>4 tabs for 2 days, then 3 tabs for 2 days, 2 tabs for 2 days, then 1  tab for 2 days, then stop  >>> Take with food Nasal saline samples provided today >>> Can do as needed  Continue Flonase and other medications as prescribed  Seasonal and perennial allergic rhinitis Continue Flonase Can do nasal saline rinses     Lauraine Rinne, NP 12/27/2017

## 2017-12-26 NOTE — Telephone Encounter (Signed)
Called and spoke to patient. Patient states she is having sinus congestion with facial pain, sore throat, productive cough (white/clear mucous) but no fever. Patient stated she feels like it's moving into her chest. Scheduled patient to see Wyn Quaker, NP 5/30 at 10:00. Nothing further needed at this time.

## 2017-12-27 ENCOUNTER — Encounter: Payer: Self-pay | Admitting: Pulmonary Disease

## 2017-12-27 ENCOUNTER — Ambulatory Visit (INDEPENDENT_AMBULATORY_CARE_PROVIDER_SITE_OTHER): Payer: Medicare Other | Admitting: Pulmonary Disease

## 2017-12-27 VITALS — BP 128/82 | HR 63 | Ht 69.0 in | Wt 230.2 lb

## 2017-12-27 DIAGNOSIS — J4530 Mild persistent asthma, uncomplicated: Secondary | ICD-10-CM

## 2017-12-27 DIAGNOSIS — J302 Other seasonal allergic rhinitis: Secondary | ICD-10-CM

## 2017-12-27 DIAGNOSIS — J0100 Acute maxillary sinusitis, unspecified: Secondary | ICD-10-CM

## 2017-12-27 DIAGNOSIS — J3089 Other allergic rhinitis: Secondary | ICD-10-CM

## 2017-12-27 DIAGNOSIS — M5416 Radiculopathy, lumbar region: Secondary | ICD-10-CM | POA: Diagnosis not present

## 2017-12-27 MED ORDER — DOXYCYCLINE HYCLATE 100 MG PO TABS: 100.0000 mg | ORAL_TABLET | Freq: Two times a day (BID) | ORAL | 0 refills | Status: DC

## 2017-12-27 MED ORDER — PREDNISONE 10 MG PO TABS: ORAL_TABLET | ORAL | 0 refills | Status: DC

## 2017-12-27 NOTE — Assessment & Plan Note (Signed)
Continue Flonase Can do nasal saline rinses

## 2017-12-27 NOTE — Assessment & Plan Note (Signed)
Doxycycline 100 mg twice a day for 7 days >>>take with food  >>> Call Coumadin clinic to notify them that you are starting doxycycline.  That way they are aware.  Starting antibiotic such as this with Coumadin needs to have close monitoring with the Coumadin clinic.  Prednisone >>>4 tabs for 2 days, then 3 tabs for 2 days, 2 tabs for 2 days, then 1 tab for 2 days, then stop  >>> Take with food Nasal saline samples provided today >>> Can do as needed  Continue Flonase and other medications as prescribed

## 2017-12-27 NOTE — Patient Instructions (Addendum)
Doxycycline 100 mg twice a day for 7 days >>>take with food  >>> Call Coumadin clinic to notify them that you are starting doxycycline.  That way they are aware.  Starting antibiotic such as this with Coumadin needs to have close monitoring with the Coumadin clinic.  Prednisone >>>4 tabs for 2 days, then 3 tabs for 2 days, 2 tabs for 2 days, then 1 tab for 2 days, then stop  >>> Take with food  Nasal saline samples provided today >>> Can do as needed   Continue Flonase and other medications as prescribed  Can use rescue inhaler if you get short of breath     Please contact the office if your symptoms worsen or you have concerns that you are not improving.   Thank you for choosing [facility] for your healthcare, and for allowing [place] to partner with you on your healthcare journey. I am thankful to be able to provide care to you today.   Wyn Quaker FNP-C

## 2017-12-28 ENCOUNTER — Other Ambulatory Visit: Payer: Self-pay | Admitting: Endocrinology

## 2018-01-01 ENCOUNTER — Ambulatory Visit (INDEPENDENT_AMBULATORY_CARE_PROVIDER_SITE_OTHER): Payer: Medicare Other | Admitting: General Practice

## 2018-01-01 DIAGNOSIS — Z7901 Long term (current) use of anticoagulants: Secondary | ICD-10-CM | POA: Diagnosis not present

## 2018-01-01 LAB — POCT INR: INR: 3 (ref 2.0–3.0)

## 2018-01-01 NOTE — Patient Instructions (Addendum)
Pre visit review using our clinic review tool, if applicable. No additional management support is needed unless otherwise documented below in the visit note.  Continue to take 2 tablets daily until you finish the antibiotic and then continue to take 2 tablets daily except 3 tablets on Mondays. Re-check in 6 weeks.

## 2018-01-09 ENCOUNTER — Other Ambulatory Visit: Payer: Self-pay | Admitting: Endocrinology

## 2018-01-15 ENCOUNTER — Other Ambulatory Visit (INDEPENDENT_AMBULATORY_CARE_PROVIDER_SITE_OTHER): Payer: Medicare Other

## 2018-01-15 DIAGNOSIS — E1142 Type 2 diabetes mellitus with diabetic polyneuropathy: Secondary | ICD-10-CM

## 2018-01-15 DIAGNOSIS — E063 Autoimmune thyroiditis: Secondary | ICD-10-CM

## 2018-01-15 DIAGNOSIS — G609 Hereditary and idiopathic neuropathy, unspecified: Secondary | ICD-10-CM | POA: Diagnosis not present

## 2018-01-15 LAB — MICROALBUMIN / CREATININE URINE RATIO
CREATININE, U: 109.5 mg/dL
MICROALB/CREAT RATIO: 0.6 mg/g (ref 0.0–30.0)

## 2018-01-15 LAB — CBC
HCT: 40.4 % (ref 36.0–46.0)
Hemoglobin: 13.2 g/dL (ref 12.0–15.0)
MCHC: 32.8 g/dL (ref 30.0–36.0)
MCV: 87.9 fl (ref 78.0–100.0)
PLATELETS: 155 10*3/uL (ref 150.0–400.0)
RBC: 4.59 Mil/uL (ref 3.87–5.11)
RDW: 15.5 % (ref 11.5–15.5)
WBC: 4.1 10*3/uL (ref 4.0–10.5)

## 2018-01-15 LAB — COMPREHENSIVE METABOLIC PANEL
ALBUMIN: 4 g/dL (ref 3.5–5.2)
ALK PHOS: 72 U/L (ref 39–117)
ALT: 79 U/L — ABNORMAL HIGH (ref 0–35)
AST: 44 U/L — AB (ref 0–37)
BUN: 20 mg/dL (ref 6–23)
CO2: 29 mEq/L (ref 19–32)
CREATININE: 0.9 mg/dL (ref 0.40–1.20)
Calcium: 9.4 mg/dL (ref 8.4–10.5)
Chloride: 101 mEq/L (ref 96–112)
GFR: 79.58 mL/min (ref >60.00)
GLUCOSE: 123 mg/dL — AB (ref 70–99)
Potassium: 4.4 mEq/L (ref 3.5–5.1)
SODIUM: 137 meq/L (ref 135–145)
TOTAL PROTEIN: 7 g/dL (ref 6.0–8.3)
Total Bilirubin: 0.3 mg/dL (ref 0.2–1.2)

## 2018-01-15 LAB — LIPID PANEL
CHOLESTEROL: 189 mg/dL (ref 0–200)
HDL: 97.3 mg/dL (ref >39.00)
LDL Cholesterol: 82 mg/dL (ref 0–99)
NONHDL: 91.95
Total CHOL/HDL Ratio: 2
Triglycerides: 50 mg/dL (ref 0.0–149.0)
VLDL: 10 mg/dL (ref 0.0–40.0)

## 2018-01-15 LAB — HEMOGLOBIN A1C: Hgb A1c MFr Bld: 7.2 % — ABNORMAL HIGH (ref 4.6–6.5)

## 2018-01-15 LAB — T4, FREE: FREE T4: 0.88 ng/dL (ref 0.60–1.60)

## 2018-01-15 LAB — TSH: TSH: 2.07 u[IU]/mL (ref 0.35–4.50)

## 2018-01-17 ENCOUNTER — Other Ambulatory Visit: Payer: Self-pay | Admitting: Neurology

## 2018-01-24 ENCOUNTER — Encounter: Payer: Self-pay | Admitting: Endocrinology

## 2018-01-24 ENCOUNTER — Ambulatory Visit (INDEPENDENT_AMBULATORY_CARE_PROVIDER_SITE_OTHER): Payer: Medicare Other | Admitting: Endocrinology

## 2018-01-24 VITALS — BP 122/74 | HR 82 | Ht 69.0 in | Wt 230.4 lb

## 2018-01-24 DIAGNOSIS — R252 Cramp and spasm: Secondary | ICD-10-CM | POA: Diagnosis not present

## 2018-01-24 DIAGNOSIS — R945 Abnormal results of liver function studies: Secondary | ICD-10-CM

## 2018-01-24 DIAGNOSIS — E161 Other hypoglycemia: Secondary | ICD-10-CM

## 2018-01-24 NOTE — Progress Notes (Signed)
Patient ID: Kelli Martinez, female   DOB: August 27, 1945, 72 y.o.   MRN: [id number]    Chief complaint: Followup of various issues   History of Present Illness:  The patient is seen for the following problems:  1.  Postprandial hypoglycemia related to dumping syndrome   She has had postprandial hypoglycemia starting a couple of years after her gastric bypass surgery Her symptoms of blood sugars are mostly a feeling of significant weakness, some shakiness.  She will use glucose tablets or juice in order to relieve the symptoms, recently she thinks that 1 or 2 glucose tablets are usually adequate She has been to the dietitian and has been instructed on balanced low-fat meals with enough protein consistently and restricting carbohydrates including fruits   Recent history:  She has been treated with acarbose with reduction in frequency of hypoglycemia  She did try verapamil empirically but this did not help.  She is taking her Precose usually right before eating, 1 tablet at breakfast 2 at lunch and 1 at dinner  Recent blood sugars and management:  She has had only occasional low blood sugars which are not severe but she has not been documenting any recently  She again has the somewhat randomly after breakfast or lunch but no consistent pattern again  2.  SWELLING of the legs:  She has had less problems with her swelling She also has taken less Lasix because of tendency to muscle cramps and is using this only as needed Also on Aldactone She has been told to try and wear elastic stockings but does not do this often  PROBLEM 3: DIABETES:  This has been mild since her gastric bypass surgery  Usually has a normal A1c readings around 6.2-6.6  However more recently her A1c has been 6.8-6.9, now 7.2  Current management, blood sugar patterns:  Blood sugar readings are averaging 97 at home and highest blood sugar was 115 which was this morning before breakfast  Has  checked only 2 or 3 readings in the morning before breakfast  She only checks blood sugar before eating usually before breakfast and lunch and not after meals Lab fasting glucose was 123 She is taking METFORMIN 500 mg at bedtime that was started when her A1c was higher  She is taking Precose mostly to prevent reactive hypoglycemia  Blood sugar readings: See above She is checking blood sugars on an average less than once a day    Wt Readings from Last 3 Encounters:  01/24/18 230 lb 6.4 oz (104.5 kg)  12/27/17 230 lb 3.2 oz (104.4 kg)  11/20/17 237 lb 6.4 oz (107.7 kg)    Lab Results  Component Value Date   HGBA1C 7.2 (H) 01/15/2018   HGBA1C 6.8 (H) 09/17/2017   HGBA1C 6.4 06/11/2017   Lab Results  Component Value Date   MICROALBUR <0.7 01/15/2018   LDLCALC 82 01/15/2018   CREATININE 0.90 01/15/2018          Other active problems: see review of systems      Allergies as of 01/24/2018      Reactions   Penicillins    blisters   Adhesive [tape] Rash      Medication List        Accurate as of 01/24/18  1:13 PM. Always use your most recent med list.          acarbose 25 MG tablet Commonly known as:  PRECOSE TAKE 1 TABLET THREE TIMES A DAY   albuterol  108 (90 Base) MCG/ACT inhaler Commonly known as:  PROAIR HFA USE 2 INHALATIONS FOUR TIMES A DAY AS NEEDED ( AS NEEDED RESCUE INHALER)   atorvastatin 20 MG tablet Commonly known as:  LIPITOR TAKE 1 TABLET DAILY   baclofen 10 MG tablet Commonly known as:  LIORESAL Take 10 mg by mouth 2 (two) times daily.   baclofen 20 MG tablet Commonly known as:  LIORESAL Take 1 tablet (20 mg total) by mouth at bedtime. TAKE 1 TABLET 3 TIMES A DAY   CALTRATE 600+D 600-400 MG-UNIT tablet Generic drug:  Calcium Carbonate-Vitamin D Take 1 tablet by mouth daily.   clobetasol cream 0.05 % Commonly known as:  TEMOVATE Apply 1 application topically 2 (two) times daily.   diazepam 10 MG Gel Commonly known as:  DIASTAT  ACUDIAL Place 5 mg rectally once as needed (cramping).   ferrous sulfate 325 (65 FE) MG tablet Take 325 mg by mouth daily.   fluticasone 50 MCG/ACT nasal spray Commonly known as:  FLONASE Place 2 sprays into both nostrils daily.   Fluticasone-Salmeterol 100-50 MCG/DOSE Aepb Commonly known as:  ADVAIR DISKUS USE 1 INHALATION EVERY 12 HOURS, RINSE MOUTH   freestyle lancets USE AS INSTRUCTED TO CHECK BLOOD SUGAR ONCE DAILY   FREESTYLE LITE Devi Use to check blood sugar once a day DX code E11.65   FREESTYLE FREEDOM LITE w/Device Kit use as directed   FREESTYLE LITE test strip Generic drug:  glucose blood USE AS INSTRUCTED TO CHECK BLOOD SUGAR ONCE DAILY   furosemide 40 MG tablet Commonly known as:  LASIX TAKE 1 TABLET EVERY MORNING   gabapentin 300 MG capsule Commonly known as:  NEURONTIN TAKE 2 CAPSULES THREE TIMES A DAY   levothyroxine 125 MCG tablet Commonly known as:  SYNTHROID, LEVOTHROID TAKE 1 TABLET DAILY BEFORE BREAKFAST   metFORMIN 500 MG tablet Commonly known as:  GLUCOPHAGE Take 1 tablet (500 mg total) by mouth at bedtime.   methylcellulose 1 % ophthalmic solution Commonly known as:  ARTIFICIAL TEARS Place 1 drop into both eyes as needed. Dry eyes   NON FORMULARY CPAP 6 [employer]   omeprazole 20 MG capsule Commonly known as:  PRILOSEC TAKE 1 CAPSULE DAILY   SLOW-MAG PO Take 500 mg by mouth.   spironolactone 50 MG tablet Commonly known as:  ALDACTONE TAKE 1 TABLET TWICE A DAY   temazepam 15 MG capsule Commonly known as:  RESTORIL 1-2 for sleep as needed   traMADol 50 MG tablet Commonly known as:  ULTRAM Take 1 tablet (50 mg total) by mouth every 12 (twelve) hours as needed.   vitamin B-12 500 MCG tablet Commonly known as:  CYANOCOBALAMIN Take 500 mcg by mouth daily.   Vitamin D (Ergocalciferol) 50000 units Caps capsule Commonly known as:  DRISDOL TAKE 1 CAPSULE EVERY FRIDAY   warfarin 5 MG tablet Commonly known as:  COUMADIN Take as  directed by the anticoagulation clinic. If you are unsure how to take this medication, talk to your nurse or doctor. Original instructions:  Take 2 tablets daily except 3 tablets on Mondays only OR AS DIRECTED BY ANTICOAGULATION CLINIC  (90 DAY SUPPLY)       Allergies:  Allergies  Allergen Reactions  . Penicillins     blisters  . Adhesive [Tape] Rash    Past Medical History:  Diagnosis Date  . Allergy   . Anemia   . Arthritis   . Asthma   . Back pain, chronic    GETS INJECTIONS IN  BACK  . Blind right eye    hemorrhage  . Blood transfusion   . Cancer ([place])    breast 1994  . Clotting disorder ([place])   . Diabetes mellitus without complication ([place])    takes precose  . Elevated liver enzymes   . Gallstones   . GERD (gastroesophageal reflux disease)   . HX: breast cancer   . Hyperlipidemia   . Hypertension   . Hypothyroid   . Lymphedema of arm    RT  . Morbid obesity ([place])   . OSA (obstructive sleep apnea)    has not used in 2 years-lost weight  . Osteoporosis   . Peripheral neuropathy    on gabapentin  . PONV (postoperative nausea and vomiting)   . Psoriasis   . Pulmonary embolism ([place]) 1998 / 1994 /1968  . Syncope and collapse 2011   due to anemia    Past Surgical History:  Procedure Laterality Date  . ABDOMINAL HYSTERECTOMY    . BONE MARROW TRANSPLANT  1994  . CARDIOVASCULAR STRESS TEST  05/23/2005   EF 53%  . carpal tunnell     bil  . cataracts    . CHOLECYSTECTOMY  05/10/2012   Procedure: LAPAROSCOPIC CHOLECYSTECTOMY WITH INTRAOPERATIVE CHOLANGIOGRAM;  Surgeon: Pedro Earls, MD;  Location: [facility];  Service: General;  Laterality: N/A;  Laparoscopic Cholecystectomy with Intraoperative Cholangiogram  . GASTRIC BYPASS  2011   bariatric surgery  . HIATAL HERNIA REPAIR    . IVC filter     recurrent DVT  . KNEE ARTHROTOMY  1998  . MASS EXCISION Right 11/24/2016   Procedure: EXCISION RIGHT AXILLARY LESION AND CHEST WALL LESION;  Surgeon: Johnathan Hausen,  MD;  Location: [place] [facility];  Service: General;  Laterality: Right;  . MASTECTOMY     Right  . US ECHOCARDIOGRAPHY  10/27/08   EF 55-60%    Family History  Problem Relation Age of Onset  . Sudden death Mother        car accident  . Cancer Father 71       lung  . Cancer Sister 81       lung cancer  . Asthma Daughter     Social History:  reports that she quit smoking about 39 years ago. Her smoking use included cigarettes. She has a 5.00 pack-year smoking history. She has never used smokeless tobacco. She reports that she does not drink alcohol or use drugs.  Review of Systems   ANTICOAGULATION: Her Coumadin has been followed regularly in the clinic INR results as follows  Lab Results  Component Value Date   INR 3.0 01/01/2018   INR 2.7 12/25/2017   INR 2.9 11/27/2017    ABNORMAL liver functions: She has had these before transiently and no etiology found Again labs are normal and she has not been on any new medications OTC products  Lab Results  Component Value Date   ALT 79 (H) 01/15/2018   ALT 25 01/19/2016     Neuropathy with burning, some numbness and lower leg pain:  She gets relief with nortriptyline, also on gabapentin   MUSCLE cramps/spasms: She is being treated with 10 mg baclofen during the day twice daily and 20 mg at night Recently is doing better with taking an extra baclofen during the day as needed She also uses tonic water, mustard and magnesium supplements   Her pain management doctor asked her to increase her magnesium  Has seen the neurologist also for this problem  Lymphedema right arm, secondary to  breast surgery, using  elastic compression stocking, needs another one    History of mild hypothyroidism: This has been controlled with 62.5 mcg ,  has recent normal TSH   Lab Results  Component Value Date   TSH 2.07 01/15/2018    Hypercholesterolemia: LDL has been at target with Lipitor 10 mg Taking this also for  cardiovascular prophylaxis  Lipid levels:  Lab Results  Component Value Date   CHOL 189 01/15/2018   HDL 97.30 01/15/2018   LDLCALC 82 01/15/2018   TRIG 50.0 01/15/2018   CHOLHDL 2 01/15/2018    Previous history of hypertension: Blood pressure low normal because of diuretics   BP Readings from Last 3 Encounters:  01/24/18 122/74  12/27/17 128/82  11/20/17 102/68       LABS:  Lab on 01/15/2018  Component Date Value Ref Range Status  . WBC 01/15/2018 4.1  4.0 - 10.5 K/uL Final  . RBC 01/15/2018 4.59  3.87 - 5.11 Mil/uL Final  . Platelets 01/15/2018 155.0  150.0 - 400.0 K/uL Final  . Hemoglobin 01/15/2018 13.2  12.0 - 15.0 g/dL Final  . HCT 01/15/2018 40.4  36.0 - 46.0 % Final  . MCV 01/15/2018 87.9  78.0 - 100.0 fl Final  . MCHC 01/15/2018 32.8  30.0 - 36.0 g/dL Final  . RDW 01/15/2018 15.5  11.5 - 15.5 % Final  . Free T4 01/15/2018 0.88  0.60 - 1.60 ng/dL Final   Comment: Specimens from patients who are undergoing biotin therapy and /or ingesting biotin supplements may contain high levels of biotin.  The higher biotin concentration in these specimens interferes with this Free T4 assay.  Specimens that contain high levels  of biotin may cause false high results for this Free T4 assay.  Please interpret results in light of the total clinical presentation of the patient.    Marland Kitchen TSH 01/15/2018 2.07  0.35 - 4.50 uIU/mL Final  . Microalb, Ur 01/15/2018 <0.7  0.0 - 1.9 mg/dL Final  . Creatinine,U 01/15/2018 109.5  mg/dL Final  . Microalb Creat Ratio 01/15/2018 0.6  0.0 - 30.0 mg/g Final  . Cholesterol 01/15/2018 189  0 - 200 mg/dL Final   ATP III Classification       Desirable:  < 200 mg/dL               Borderline High:  200 - 239 mg/dL          High:  > = 240 mg/dL  . Triglycerides 01/15/2018 50.0  0.0 - 149.0 mg/dL Final   Normal:  <150 mg/dLBorderline High:  150 - 199 mg/dL  . HDL 01/15/2018 97.30  >39.00 mg/dL Final  . VLDL 01/15/2018 10.0  0.0 - 40.0 mg/dL Final  . LDL  Cholesterol 01/15/2018 82  0 - 99 mg/dL Final  . Total CHOL/HDL Ratio 01/15/2018 2   Final                  Men          Women1/2 Average Risk     3.4          3.3Average Risk          5.0          4.42X Average Risk          9.6          7.13X Average Risk          15.0  11.0                      . NonHDL 01/15/2018 91.95   Final   NOTE:  Non-HDL goal should be 30 mg/dL higher than patient's LDL goal (i.e. LDL goal of < 70 mg/dL, would have non-HDL goal of < 100 mg/dL)  . Sodium 01/15/2018 137  135 - 145 mEq/L Final  . Potassium 01/15/2018 4.4  3.5 - 5.1 mEq/L Final  . Chloride 01/15/2018 101  96 - 112 mEq/L Final  . CO2 01/15/2018 29  19 - 32 mEq/L Final  . Glucose, Bld 01/15/2018 123* 70 - 99 mg/dL Final  . BUN 01/15/2018 20  6 - 23 mg/dL Final  . Creatinine, Ser 01/15/2018 0.90  0.40 - 1.20 mg/dL Final  . Total Bilirubin 01/15/2018 0.3  0.2 - 1.2 mg/dL Final  . Alkaline Phosphatase 01/15/2018 72  39 - 117 U/L Final  . AST 01/15/2018 44* 0 - 37 U/L Final  . ALT 01/15/2018 79* 0 - 35 U/L Final  . Total Protein 01/15/2018 7.0  6.0 - 8.3 g/dL Final  . Albumin 01/15/2018 4.0  3.5 - 5.2 g/dL Final  . Calcium 01/15/2018 9.4  8.4 - 10.5 mg/dL Final  . GFR 01/15/2018 79.58  >60.00 mL/min Final  . Hgb A1c MFr Bld 01/15/2018 7.2* 4.6 - 6.5 % Final   Glycemic Control Guidelines for People with Diabetes:Non Diabetic:  <6%Goal of Therapy: <7%Additional Action Suggested:  >8%   Anti-coag visit on 01/01/2018  Component Date Value Ref Range Status  . INR 01/01/2018 3.0  2.0 - 3.0 Final  Anti-coag visit on 12/25/2017  Component Date Value Ref Range Status  . INR 12/25/2017 2.7  2.0 - 3.0 Final     EXAM:  BP 122/74 (BP Location: Left Arm, Patient Position: Sitting, Cuff Size: Normal)   Pulse 82   Ht _0  (1.753 m)   Wt 230 lb 6.4 oz (104.5 kg)   SpO2 97%   BMI 34.02 kg/m    Assessment/Plan:   1.  EDEMA: Better controlled recently and she also has not taken as much  Lasix Encouraged her to wear her elastic stockings regularly   2.  Neuropathy and numbness: Her podiatrist wants her to have special shoes and prescription given  3. Mild diabetes: A1c is surprisingly higher over 7% despite fairly good readings at home She does need to start checking readings more after meals Because of her hypoglycemia tendency will not increase her metformin as yet  Continue Precose also  4.   Muscle cramps: Controlled with baclofen and magnesium and will continue same management  7.  Abnormal liver function: Etiology unclear but temporarily we will stop her Lipitor  8.  Mild hypothyroidism: To continue same dose as TSH normal  Patient Instructions  Check blood sugars on waking up  2-3/7  Also check blood sugars about 2 hours after a meal and do this after different meals by rotation  Recommended blood sugar levels on waking up is 90-120 and about 2 hours after meal is 130-160  Please bring your blood sugar monitor to each visit, thank you  Stop Lipitor   Elayne Snare 01/24/2018, 1:13 PM

## 2018-01-24 NOTE — Patient Instructions (Addendum)
Check blood sugars on waking up  2-3/7  Also check blood sugars about 2 hours after a meal and do this after different meals by rotation  Recommended blood sugar levels on waking up is 90-120 and about 2 hours after meal is 130-160  Please bring your blood sugar monitor to each visit, thank you  Stop Lipitor

## 2018-01-28 ENCOUNTER — Ambulatory Visit: Payer: Medicare Other | Admitting: Neurology

## 2018-02-05 ENCOUNTER — Ambulatory Visit: Payer: Medicare Other

## 2018-02-12 ENCOUNTER — Ambulatory Visit (INDEPENDENT_AMBULATORY_CARE_PROVIDER_SITE_OTHER): Payer: Medicare Other | Admitting: General Practice

## 2018-02-12 DIAGNOSIS — Z7901 Long term (current) use of anticoagulants: Secondary | ICD-10-CM | POA: Diagnosis not present

## 2018-02-12 LAB — POCT INR: INR: 3.6 — AB (ref 2.0–3.0)

## 2018-02-12 NOTE — Patient Instructions (Addendum)
Pre visit review using our clinic review tool, if applicable. No additional management support is needed unless otherwise documented below in the visit note.  Hold dosage tomorrow (7/17) and then continue to take 2 tablets daily except 3 tablets on Mondays. Re-check in 4 weeks.

## 2018-03-12 ENCOUNTER — Ambulatory Visit (INDEPENDENT_AMBULATORY_CARE_PROVIDER_SITE_OTHER): Payer: Medicare Other | Admitting: General Practice

## 2018-03-12 DIAGNOSIS — M65871 Other synovitis and tenosynovitis, right ankle and foot: Secondary | ICD-10-CM | POA: Diagnosis not present

## 2018-03-12 DIAGNOSIS — M65872 Other synovitis and tenosynovitis, left ankle and foot: Secondary | ICD-10-CM | POA: Diagnosis not present

## 2018-03-12 DIAGNOSIS — M7672 Peroneal tendinitis, left leg: Secondary | ICD-10-CM | POA: Diagnosis not present

## 2018-03-12 DIAGNOSIS — M792 Neuralgia and neuritis, unspecified: Secondary | ICD-10-CM | POA: Diagnosis not present

## 2018-03-12 DIAGNOSIS — Z7901 Long term (current) use of anticoagulants: Secondary | ICD-10-CM | POA: Diagnosis not present

## 2018-03-12 LAB — POCT INR: INR: 3.3 — AB (ref 2.0–3.0)

## 2018-03-12 NOTE — Patient Instructions (Addendum)
Pre visit review using our clinic review tool, if applicable. No additional management support is needed unless otherwise documented below in the visit note.  Hold dosage tomorrow (8/14) and then change dosage to 2 tablets daily.  Re-check in 4 weeks.

## 2018-03-14 ENCOUNTER — Other Ambulatory Visit (INDEPENDENT_AMBULATORY_CARE_PROVIDER_SITE_OTHER): Payer: Medicare Other

## 2018-03-14 DIAGNOSIS — R252 Cramp and spasm: Secondary | ICD-10-CM

## 2018-03-14 DIAGNOSIS — R945 Abnormal results of liver function studies: Secondary | ICD-10-CM | POA: Diagnosis not present

## 2018-03-14 LAB — COMPREHENSIVE METABOLIC PANEL
ALBUMIN: 3.7 g/dL (ref 3.5–5.2)
ALK PHOS: 65 U/L (ref 39–117)
ALT: 26 U/L (ref 0–35)
AST: 23 U/L (ref 0–37)
BILIRUBIN TOTAL: 0.4 mg/dL (ref 0.2–1.2)
BUN: 18 mg/dL (ref 6–23)
CALCIUM: 9.5 mg/dL (ref 8.4–10.5)
CO2: 29 mEq/L (ref 19–32)
Chloride: 105 mEq/L (ref 96–112)
Creatinine, Ser: 0.99 mg/dL (ref 0.40–1.20)
GFR: 71.26 mL/min (ref >60.00)
Glucose, Bld: 103 mg/dL — ABNORMAL HIGH (ref 70–99)
Potassium: 3.8 mEq/L (ref 3.5–5.1)
Sodium: 141 mEq/L (ref 135–145)
TOTAL PROTEIN: 6.6 g/dL (ref 6.0–8.3)

## 2018-03-14 LAB — MAGNESIUM: Magnesium: 2 mg/dL (ref 1.5–2.5)

## 2018-03-19 ENCOUNTER — Ambulatory Visit (INDEPENDENT_AMBULATORY_CARE_PROVIDER_SITE_OTHER): Payer: Medicare Other | Admitting: Endocrinology

## 2018-03-19 ENCOUNTER — Encounter: Payer: Self-pay | Admitting: Endocrinology

## 2018-03-19 VITALS — BP 124/78 | HR 72 | Ht 69.0 in | Wt 241.2 lb

## 2018-03-19 DIAGNOSIS — G609 Hereditary and idiopathic neuropathy, unspecified: Secondary | ICD-10-CM

## 2018-03-19 DIAGNOSIS — E039 Hypothyroidism, unspecified: Secondary | ICD-10-CM | POA: Diagnosis not present

## 2018-03-19 DIAGNOSIS — K912 Postsurgical malabsorption, not elsewhere classified: Secondary | ICD-10-CM | POA: Insufficient documentation

## 2018-03-19 DIAGNOSIS — E559 Vitamin D deficiency, unspecified: Secondary | ICD-10-CM

## 2018-03-19 DIAGNOSIS — R7989 Other specified abnormal findings of blood chemistry: Secondary | ICD-10-CM

## 2018-03-19 DIAGNOSIS — R945 Abnormal results of liver function studies: Secondary | ICD-10-CM | POA: Diagnosis not present

## 2018-03-19 DIAGNOSIS — E1142 Type 2 diabetes mellitus with diabetic polyneuropathy: Secondary | ICD-10-CM | POA: Diagnosis not present

## 2018-03-19 DIAGNOSIS — R600 Localized edema: Secondary | ICD-10-CM

## 2018-03-19 MED ORDER — PRAVASTATIN SODIUM 40 MG PO TABS: 40.0000 mg | ORAL_TABLET | Freq: Every day | ORAL | 3 refills | Status: DC

## 2018-03-19 MED ORDER — ACARBOSE 25 MG PO TABS: ORAL_TABLET | ORAL | 1 refills | Status: DC

## 2018-03-19 MED ORDER — SPIRONOLACTONE 100 MG PO TABS: ORAL_TABLET | ORAL | 2 refills | Status: DC

## 2018-03-19 NOTE — Patient Instructions (Addendum)
2 Aldactone in am  2 acarbose at lunch

## 2018-03-19 NOTE — Progress Notes (Signed)
Patient ID: Kelli Martinez, female   DOB: 1945-12-15, 72 y.o.   MRN: [id number]    Chief complaint: Followup of various issues   History of Present Illness:  The patient is seen for the following problems:  1.  Postprandial hypoglycemia related to dumping syndrome   She has had postprandial hypoglycemia starting a couple of years after her gastric bypass surgery Her symptoms of blood sugars are mostly a feeling of significant weakness, some shakiness.  She will use glucose tablets or juice in order to relieve the symptoms, recently she thinks that 1 or 2 glucose tablets are usually adequate She has been to the dietitian and has been instructed on balanced low-fat meals with enough protein consistently and restricting carbohydrates including fruits   Recent history:  She has been treated with acarbose with reduction in frequency of hypoglycemia  She did try verapamil empirically but this did not help.  She is taking her Precose right when she starts eating, 1 tablet at breakfast 2 at lunch and 1 at dinner However because of her prescription quantity she recently has taken only 1 tablet with every meal  She will have occasional low blood sugars, recently only one documented in the late afternoon which was 56 She does try to have some protein with each meals  2.  SWELLING of the legs:  She has had significant problems with her legs swelling recently She says that because of his legs swell up right after she gets up in the morning she cannot wear her elastic stockings, also her stockings currently are below the knee She takes Lasix only occasionally because of muscle cramps and frequent urination with this  Also on Aldactone twice daily  PROBLEM 3:  DIABETES:  This has been mild and well controlled since her gastric bypass surgery  The A1c has been 6.8-6.9 usually, most recently 7.2  Current management, blood sugar patterns:  Blood sugar readings are being  checked very infrequently and she has only a couple of fasting readings of 101 and 102 and her average for the last month is about 95 with no high readings  However she claims that when blood sugars goes up above 100 or so she feels a little tired and blurring of vision may occur  She is not able to exercise much because of various reasons  Although her portions are relatively small her weight has gone up which she thinks is from fluid retention Lab fasting glucose was 103 She is taking METFORMIN 500 mg at bedtime that was started when her A1c was higher  She is taking Precose also to prevent reactive hypoglycemia    Wt Readings from Last 3 Encounters:  03/19/18 241 lb 3.2 oz (109.4 kg)  01/24/18 230 lb 6.4 oz (104.5 kg)  12/27/17 230 lb 3.2 oz (104.4 kg)    Lab Results  Component Value Date   HGBA1C 7.2 (H) 01/15/2018   HGBA1C 6.8 (H) 09/17/2017   HGBA1C 6.4 06/11/2017   Lab Results  Component Value Date   MICROALBUR <0.7 01/15/2018   LDLCALC 82 01/15/2018   CREATININE 0.99 03/14/2018          Other active problems: see review of systems      Allergies as of 03/19/2018      Reactions   Penicillins    blisters   Adhesive [tape] Rash      Medication List        Accurate as of 03/19/18  9:54 AM. Always  use your most recent med list.          acarbose 25 MG tablet Commonly known as:  PRECOSE TAKE 1 TABLET THREE TIMES A DAY   albuterol 108 (90 Base) MCG/ACT inhaler Commonly known as:  PROVENTIL HFA;VENTOLIN HFA USE 2 INHALATIONS FOUR TIMES A DAY AS NEEDED ( AS NEEDED RESCUE INHALER)   atorvastatin 20 MG tablet Commonly known as:  LIPITOR TAKE 1 TABLET DAILY   baclofen 20 MG tablet Commonly known as:  LIORESAL Take 1 tablet (20 mg total) by mouth at bedtime. TAKE 1 TABLET 3 TIMES A DAY   CALTRATE 600+D 600-400 MG-UNIT tablet Generic drug:  Calcium Carbonate-Vitamin D Take 1 tablet by mouth daily.   clobetasol cream 0.05 % Commonly known as:   TEMOVATE Apply 1 application topically 2 (two) times daily.   diazepam 10 MG Gel Commonly known as:  DIASTAT ACUDIAL Place 5 mg rectally once as needed (cramping).   ferrous sulfate 325 (65 FE) MG tablet Take 325 mg by mouth daily.   fluticasone 50 MCG/ACT nasal spray Commonly known as:  FLONASE Place 2 sprays into both nostrils daily.   Fluticasone-Salmeterol 100-50 MCG/DOSE Aepb Commonly known as:  ADVAIR USE 1 INHALATION EVERY 12 HOURS, RINSE MOUTH   freestyle lancets USE AS INSTRUCTED TO CHECK BLOOD SUGAR ONCE DAILY   FREESTYLE LITE Devi Use to check blood sugar once a day DX code E11.65   FREESTYLE FREEDOM LITE w/Device Kit use as directed   FREESTYLE LITE test strip Generic drug:  glucose blood USE AS INSTRUCTED TO CHECK BLOOD SUGAR ONCE DAILY   furosemide 40 MG tablet Commonly known as:  LASIX TAKE 1 TABLET EVERY MORNING   gabapentin 300 MG capsule Commonly known as:  NEURONTIN TAKE 2 CAPSULES THREE TIMES A DAY   levothyroxine 125 MCG tablet Commonly known as:  SYNTHROID, LEVOTHROID TAKE 1 TABLET DAILY BEFORE BREAKFAST   metFORMIN 500 MG tablet Commonly known as:  GLUCOPHAGE Take 1 tablet (500 mg total) by mouth at bedtime.   methylcellulose 1 % ophthalmic solution Commonly known as:  ARTIFICIAL TEARS Place 1 drop into both eyes as needed. Dry eyes   omeprazole 20 MG capsule Commonly known as:  PRILOSEC TAKE 1 CAPSULE DAILY   pravastatin 40 MG tablet Commonly known as:  PRAVACHOL Take 1 tablet (40 mg total) by mouth daily.   SLOW-MAG PO Take 500 mg by mouth.   spironolactone 50 MG tablet Commonly known as:  ALDACTONE TAKE 1 TABLET TWICE A DAY   temazepam 15 MG capsule Commonly known as:  RESTORIL 1-2 for sleep as needed   vitamin B-12 500 MCG tablet Commonly known as:  CYANOCOBALAMIN Take 500 mcg by mouth daily.   Vitamin D (Ergocalciferol) 50000 units Caps capsule Commonly known as:  DRISDOL TAKE 1 CAPSULE EVERY FRIDAY   warfarin 5  MG tablet Commonly known as:  COUMADIN Take as directed by the anticoagulation clinic. If you are unsure how to take this medication, talk to your nurse or doctor. Original instructions:  Take 2 tablets daily except 3 tablets on Mondays only OR AS DIRECTED BY ANTICOAGULATION CLINIC  (90 DAY SUPPLY)       Allergies:  Allergies  Allergen Reactions  . Penicillins     blisters  . Adhesive [Tape] Rash    Past Medical History:  Diagnosis Date  . Allergy   . Anemia   . Arthritis   . Asthma   . Back pain, chronic    GETS  INJECTIONS IN BACK  . Blind right eye    hemorrhage  . Blood transfusion   . Cancer ([place])    breast 1994  . Clotting disorder ([place])   . Diabetes mellitus without complication ([place])    takes precose  . Elevated liver enzymes   . Gallstones   . GERD (gastroesophageal reflux disease)   . HX: breast cancer   . Hyperlipidemia   . Hypertension   . Hypothyroid   . Lymphedema of arm    RT  . Morbid obesity ([place])   . OSA (obstructive sleep apnea)    has not used in 2 years-lost weight  . Osteoporosis   . Peripheral neuropathy    on gabapentin  . PONV (postoperative nausea and vomiting)   . Psoriasis   . Pulmonary embolism ([place]) 1998 / 1994 /1968  . Syncope and collapse 2011   due to anemia    Past Surgical History:  Procedure Laterality Date  . ABDOMINAL HYSTERECTOMY    . BONE MARROW TRANSPLANT  1994  . CARDIOVASCULAR STRESS TEST  05/23/2005   EF 53%  . carpal tunnell     bil  . cataracts    . CHOLECYSTECTOMY  05/10/2012   Procedure: LAPAROSCOPIC CHOLECYSTECTOMY WITH INTRAOPERATIVE CHOLANGIOGRAM;  Surgeon: Pedro Earls, MD;  Location: [facility];  Service: General;  Laterality: N/A;  Laparoscopic Cholecystectomy with Intraoperative Cholangiogram  . GASTRIC BYPASS  2011   bariatric surgery  . HIATAL HERNIA REPAIR    . IVC filter     recurrent DVT  . KNEE ARTHROTOMY  1998  . MASS EXCISION Right 11/24/2016   Procedure: EXCISION RIGHT AXILLARY LESION  AND CHEST WALL LESION;  Surgeon: Johnathan Hausen, MD;  Location: [facility];  Service: General;  Laterality: Right;  . MASTECTOMY     Right  . US ECHOCARDIOGRAPHY  10/27/08   EF 55-60%    Family History  Problem Relation Age of Onset  . Sudden death Mother        car accident  . Cancer Father 54       lung  . Cancer Sister 57       lung cancer  . Asthma Daughter     Social History:  reports that she quit smoking about 39 years ago. Her smoking use included cigarettes. She has a 5.00 pack-year smoking history. She has never used smokeless tobacco. She reports that she does not drink alcohol or use drugs.  Review of Systems   ANTICOAGULATION: Her Coumadin has been followed regularly in the clinic INR results as follows  Lab Results  Component Value Date   INR 3.3 (A) 03/12/2018   INR 3.6 (A) 02/12/2018   INR 3.0 01/01/2018    ABNORMAL liver functions: She has had these before transiently and no etiology found  Recently with stopping Lipitor her liver functions have come back to normal  Lab Results  Component Value Date   ALT 26 03/14/2018   ALT 25 01/19/2016     Neuropathy with burning, some numbness and lower leg pain:  She gets relief with nortriptyline, also on gabapentin  MUSCLE cramps/spasms: She is being treated with 10 mg baclofen during the day twice daily and 20 mg at night Recently is doing better with taking an extra baclofen during the day as needed She also uses tonic water, mustard and magnesium supplements   Has seen the neurologist also for this condition  Lymphedema right arm, secondary to  breast surgery, using  elastic compression stocking, needs another one    History of mild hypothyroidism: This has been controlled with 62.5 mcg ,  recent TSH as follows:  Lab Results  Component Value Date   TSH 2.07 01/15/2018    Hypercholesterolemia: LDL has been at target with Lipitor 10 mg but this is now being stopped because of  possible connection with high liver functions, however has taken this for several years now   Lipid levels:  Lab Results  Component Value Date   CHOL 189 01/15/2018   HDL 97.30 01/15/2018   LDLCALC 82 01/15/2018   TRIG 50.0 01/15/2018   CHOLHDL 2 01/15/2018    No recent hypertension: He is taking Aldactone for edema, potassium has been normal  BP Readings from Last 3 Encounters:  03/19/18 124/78  01/24/18 122/74  12/27/17 128/82       LABS:  Lab on 03/14/2018  Component Date Value Ref Range Status  . Magnesium 03/14/2018 2.0  1.5 - 2.5 mg/dL Final  . Sodium 03/14/2018 141  135 - 145 mEq/L Final  . Potassium 03/14/2018 3.8  3.5 - 5.1 mEq/L Final  . Chloride 03/14/2018 105  96 - 112 mEq/L Final  . CO2 03/14/2018 29  19 - 32 mEq/L Final  . Glucose, Bld 03/14/2018 103* 70 - 99 mg/dL Final  . BUN 03/14/2018 18  6 - 23 mg/dL Final  . Creatinine, Ser 03/14/2018 0.99  0.40 - 1.20 mg/dL Final  . Total Bilirubin 03/14/2018 0.4  0.2 - 1.2 mg/dL Final  . Alkaline Phosphatase 03/14/2018 65  39 - 117 U/L Final  . AST 03/14/2018 23  0 - 37 U/L Final  . ALT 03/14/2018 26  0 - 35 U/L Final  . Total Protein 03/14/2018 6.6  6.0 - 8.3 g/dL Final  . Albumin 03/14/2018 3.7  3.5 - 5.2 g/dL Final  . Calcium 03/14/2018 9.5  8.4 - 10.5 mg/dL Final  . GFR 03/14/2018 71.26  >60.00 mL/min Final  Anti-coag visit on 03/12/2018  Component Date Value Ref Range Status  . INR 03/12/2018 3.3* 2.0 - 3.0 Final     EXAM:  BP 124/78 (BP Location: Left Arm, Patient Position: Sitting, Cuff Size: Large)   Pulse 72   Ht 5' 9"  (1.753 m)   Wt 241 lb 3.2 oz (109.4 kg)   SpO2 98%   BMI 35.62 kg/m   Moderate puffiness of her lower legs present, has tenderness to touch  Assessment/Plan:   1.  EDEMA: Not controlled recently and not clear why she is having more edema because of her salt intake and sodium containing foods being relatively low Most likely is getting worsening of her venous  insufficiency She does not find it easy to put on her stockings since her legs swell up as soon as she gets out of bed  Recommend that we increase her Aldactone 250 mg and then try 20 mg of Lasix    2.  OBESITY: Weight has gone up but may be related to edema  3. Mild diabetes: A1c was higher over 7% despite fairly good readings at home However has not checked readings much at all Fasting readings are controlled with metformin once at night Encouraged her to start being more active with some walking or water activities and watch carbohydrates better  Continue Precose with 4 tablets a day for postprandial hyperglycemia/hypoglycemia  4.   Muscle cramps: Controlled with baclofen and magnesium and will continue current doses, magnesium level normal  7.  Abnormal liver  function: Etiology unclear but levels are better with stopping Lipitor  Try her on pravastatin 40 instead of Lipitor for now  8.  Mild hypothyroidism: We will follow-up TSH again on next visit, unclear if her weight gain is related  9.  Neuropathy and other musculoskeletal pains: She wants to try hydrocodone again as tramadol causes paresthesiae  Total visit time for evaluation and management of multiple problems and counseling =25 minutes   Patient Instructions  2 Aldactone in am  2 acarbose at lunch   Elayne Snare 03/19/2018, 9:54 AM

## 2018-04-03 NOTE — Progress Notes (Signed)
@Patient  ID: Kelli Martinez, female    DOB: October 15, 1945, 72 y.o.   MRN: [id number]  Chief Complaint  Patient presents with  . Acute Visit    sinus congestion     Referring provider: Elayne Snare, MD  HPI:  72 year old female former smoker followed in our office for obstructive sleep apnea (quit CPAP), allergic rhinitis, asthma  PMH: PE/DVT/filter/Coumadin (Dr. Dwyane Dee), history of right breast cancer/mastectomy/XRT/chemo/bone marrow transplant at [facility], gastric bypass Smoker/ Smoking History: Former smoker Maintenance: Advair Pt of: Dr. Annamaria Boots  Recent [facility] Encounters:   12/26/2017-acute office visit-BM  Patient presents for acute visit.  Patient reporting congestion, sinus headache, increased sinus pain, eustachian tube dysfunction. Plan: Doxycycline, prednisone taper, follow-up with Coumadin clinic regarding new prescriptions   04/04/2018  - Visit   72 year old patient presents today for acute visit.  Patient reports 2 weeks of increased sinus pressure and pain.  Patient also reporting sinus headache.  Patient reports that symptoms have worsened over the last week.  Patient also endorsing fatigue, chills, cough with yellowish to white thick mucus.  Patient denies fevers.  Patient has been adherent Advair Diskus.  Patient has not used any over-the-counter regimens because she says "Dr. Annamaria Boots told me not to do that".  Patient denies any rescue inhaler use.    Tests:   Office Spirometry 04/27/2015-WNL-FEV1/FVC 0.81, FEV1 2.50/110%   Imaging:  CXR 08/28/17-No active cardiopulmonary disease. 11/05/2014-CT chest with contrast- status post right mastectomy with right axillary lymph node dissection, radiation changes in right upper lobe, no evidence of recurrent or metastases metastatic disease IVC filter     Chart Review:     Specialty Problems      Pulmonary Problems   Allergic asthma, mild persistent, uncomplicated    Office Spirometry 04/27/2015-WNL-FEV1/FVC  0.81, FEV1 2.50/110%       PULMONARY EMBOLISM    Chronic coumadin Chronic indwelling IVC filter       Seasonal and perennial allergic rhinitis    Qualifier: Diagnosis of  By: Wynetta Emery RN, Erika        Obstructive sleep apnea    CPAP 6 [employer]- failed, uncomfortable       Maxillary sinusitis, acute      Allergies  Allergen Reactions  . Penicillins     blisters  . Adhesive [Tape] Rash    Immunization History  Administered Date(s) Administered  . Influenza Split 04/23/2012  . Influenza Whole 03/31/2009  . Influenza, High Dose Seasonal PF 04/07/2015, 04/21/2016, 04/27/2017  . Influenza,inj,Quad PF,6+ Mos 04/21/2014  . Pneumococcal Conjugate-13 07/06/2014  . Pneumococcal Polysaccharide-23 09/06/1998  Patient unsure if she is received the flu vaccine this year.  Patient to follow-up with primary care regarding this.  Patient will not get flu vaccine today.  Past Medical History:  Diagnosis Date  . Allergy   . Anemia   . Arthritis   . Asthma   . Back pain, chronic    GETS INJECTIONS IN BACK  . Blind right eye    hemorrhage  . Blood transfusion   . Cancer ([place])    breast 1994  . Clotting disorder ([place])   . Diabetes mellitus without complication ([place])    takes precose  . Elevated liver enzymes   . Gallstones   . GERD (gastroesophageal reflux disease)   . HX: breast cancer   . Hyperlipidemia   . Hypertension   . Hypothyroid   . Lymphedema of arm    RT  . Morbid obesity ([place])   . OSA (  obstructive sleep apnea)    has not used in 2 years-lost weight  . Osteoporosis   . Peripheral neuropathy    on gabapentin  . PONV (postoperative nausea and vomiting)   . Psoriasis   . Pulmonary embolism ([place]) 1998 / 1994 /1968  . Syncope and collapse 2011   due to anemia    Tobacco History: Social History   Tobacco Use  Smoking Status Former Smoker  . Packs/day: 0.50  . Years: 10.00  . Pack years: 5.00  . Types: Cigarettes  . Last attempt to quit: 07/31/1978    . Years since quitting: 39.7  Smokeless Tobacco Never Used   Counseling given: Yes Continue not smoking  Outpatient Encounter Medications as of 04/04/2018  Medication Sig  . acarbose (PRECOSE) 25 MG tablet 1 tablet at the start of breakfast and dinner and 2 tablets at lunch  . albuterol (PROAIR HFA) 108 (90 Base) MCG/ACT inhaler USE 2 INHALATIONS FOUR TIMES A DAY AS NEEDED ( AS NEEDED RESCUE INHALER)  . baclofen (LIORESAL) 20 MG tablet Take 1 tablet (20 mg total) by mouth at bedtime. TAKE 1 TABLET 3 TIMES A DAY (Patient taking differently: Take 20 mg by mouth daily. TAKE 1 TABLET 3 TIMES A DAY)  . Blood Glucose Monitoring Suppl (FREESTYLE FREEDOM LITE) w/Device KIT use as directed  . Blood Glucose Monitoring Suppl (FREESTYLE LITE) DEVI Use to check blood sugar once a day DX code E11.65  . Calcium Carbonate-Vitamin D (CALTRATE 600+D) 600-400 MG-UNIT tablet Take 1 tablet by mouth daily.  . clobetasol cream (TEMOVATE) 3.71 % Apply 1 application topically 2 (two) times daily.  . diazepam (DIASTAT ACUDIAL) 10 MG GEL Place 5 mg rectally once as needed (cramping).  . ferrous sulfate 325 (65 FE) MG tablet Take 325 mg by mouth daily.    . fluticasone (FLONASE) 50 MCG/ACT nasal spray Place 2 sprays into both nostrils daily.  . Fluticasone-Salmeterol (ADVAIR DISKUS) 100-50 MCG/DOSE AEPB USE 1 INHALATION EVERY 12 HOURS, RINSE MOUTH  . FREESTYLE LITE test strip USE AS INSTRUCTED TO CHECK BLOOD SUGAR ONCE DAILY  . furosemide (LASIX) 40 MG tablet TAKE 1 TABLET EVERY MORNING (Patient taking differently: 20 mg. )  . gabapentin (NEURONTIN) 300 MG capsule TAKE 2 CAPSULES THREE TIMES A DAY  . Lancets (FREESTYLE) lancets USE AS INSTRUCTED TO CHECK BLOOD SUGAR ONCE DAILY  . levothyroxine (SYNTHROID, LEVOTHROID) 125 MCG tablet TAKE 1 TABLET DAILY BEFORE BREAKFAST  . Magnesium Cl-Calcium Carbonate (SLOW-MAG PO) Take 500 mg by mouth.  . metFORMIN (GLUCOPHAGE) 500 MG tablet Take 1 tablet (500 mg total) by mouth at  bedtime.  . methylcellulose (ARTIFICIAL TEARS) 1 % ophthalmic solution Place 1 drop into both eyes as needed. Dry eyes  . omeprazole (PRILOSEC) 20 MG capsule TAKE 1 CAPSULE DAILY  . pravastatin (PRAVACHOL) 40 MG tablet Take 1 tablet (40 mg total) by mouth daily.  Marland Kitchen spironolactone (ALDACTONE) 100 MG tablet 1 tablet in a.m. and half tablet in afternoon (Patient taking differently: 1 tablet in a.m. and 2 tablets in afternoon)  . temazepam (RESTORIL) 15 MG capsule 1-2 for sleep as needed  . vitamin B-12 (CYANOCOBALAMIN) 500 MCG tablet Take 500 mcg by mouth daily.    . Vitamin D, Ergocalciferol, (DRISDOL) 50000 units CAPS capsule TAKE 1 CAPSULE EVERY FRIDAY  . warfarin (COUMADIN) 5 MG tablet Take 2 tablets daily except 3 tablets on Mondays only OR AS DIRECTED BY ANTICOAGULATION CLINIC  (90 DAY SUPPLY)  . doxycycline (VIBRA-TABS) 100 MG tablet Take  1 tablet (100 mg total) by mouth 2 (two) times daily.  . predniSONE (DELTASONE) 10 MG tablet 4 tabs for 2 days, then 3 tabs for 2 days, 2 tabs for 2 days, then 1 tab for 2 days, then stop   No facility-administered encounter medications on file as of 04/04/2018.      Review of Systems  Review of Systems  Constitutional: Positive for fatigue. Negative for chills, fever and unexpected weight change.  HENT: Positive for congestion, postnasal drip, sinus pressure (R > L ) and sinus pain. Negative for ear pain.   Respiratory: Positive for cough (thick white mucous ) and wheezing (with coughing ). Negative for chest tightness and shortness of breath.   Cardiovascular: Negative for chest pain and palpitations.  Gastrointestinal: Negative for blood in stool, diarrhea, nausea and vomiting.  Genitourinary: Negative for dysuria, frequency and urgency.  Musculoskeletal: Negative for arthralgias.  Skin: Negative for color change.  Allergic/Immunologic: Negative for environmental allergies and food allergies.  Neurological: Positive for headaches. Negative for  dizziness and light-headedness.  Psychiatric/Behavioral: Negative for dysphoric mood. The patient is not nervous/anxious.   All other systems reviewed and are negative.    Physical Exam  BP 118/68 (BP Location: Left Arm, Cuff Size: Normal)   Pulse 63   Temp 98.1 F (36.7 C) (Oral)   Ht 5' 7.5" (1.715 m)   Wt 232 lb 6.4 oz (105.4 kg)   SpO2 100%   BMI 35.86 kg/m   Wt Readings from Last 5 Encounters:  04/04/18 232 lb 6.4 oz (105.4 kg)  03/19/18 241 lb 3.2 oz (109.4 kg)  01/24/18 230 lb 6.4 oz (104.5 kg)  12/27/17 230 lb 3.2 oz (104.4 kg)  11/20/17 237 lb 6.4 oz (107.7 kg)     Physical Exam  Constitutional: She is oriented to person, place, and time and well-developed, well-nourished, and in no distress. No distress.  HENT:  Head: Normocephalic and atraumatic.  Right Ear: Hearing, tympanic membrane, external ear and ear canal normal.  Left Ear: Hearing, tympanic membrane, external ear and ear canal normal.  Nose: Mucosal edema and rhinorrhea present. Right sinus exhibits maxillary sinus tenderness and frontal sinus tenderness. Left sinus exhibits maxillary sinus tenderness and frontal sinus tenderness.  Mouth/Throat: Uvula is midline and oropharynx is clear and moist. No oropharyngeal exudate.  Eyes: Pupils are equal, round, and reactive to light.  Neck: Normal range of motion. Neck supple. No JVD present.  Cardiovascular: Normal rate, regular rhythm and normal heart sounds.  Pulmonary/Chest: Effort normal and breath sounds normal. No accessory muscle usage. No respiratory distress. She has no decreased breath sounds. She has no wheezes. She has no rhonchi.  Musculoskeletal: Normal range of motion. She exhibits no edema.  Lymphadenopathy:    She has no cervical adenopathy.  Neurological: She is alert and oriented to person, place, and time. Gait normal.  Skin: Skin is warm and dry. She is not diaphoretic. No erythema.  Psychiatric: Mood, memory, affect and judgment normal.    Nursing note and vitals reviewed.     Lab Results:  CBC    Component Value Date/Time   WBC 4.1 01/15/2018 0843   RBC 4.59 01/15/2018 0843   HGB 13.2 01/15/2018 0843   HGB 12.1 01/19/2016 0906   HCT 40.4 01/15/2018 0843   HCT 37.7 01/19/2016 0906   PLT 155.0 01/15/2018 0843   PLT 184 01/19/2016 0906   MCV 87.9 01/15/2018 0843   MCV 89.0 01/19/2016 0906   MCH 28.2 02/23/2017 1105  MCHC 32.8 01/15/2018 0843   RDW 15.5 01/15/2018 0843   RDW 14.0 01/19/2016 0906   LYMPHSABS 1.8 02/23/2017 1105   LYMPHSABS 1.2 01/19/2016 0906   MONOABS 0.3 02/23/2017 1105   MONOABS 0.2 01/19/2016 0906   EOSABS 0.0 02/23/2017 1105   EOSABS 0.1 01/19/2016 0906   BASOSABS 0.0 02/23/2017 1105   BASOSABS 0.1 01/19/2016 0906    BMET    Component Value Date/Time   NA 141 03/14/2018 0807   NA 141 01/19/2016 0906   K 3.8 03/14/2018 0807   K 4.2 01/19/2016 0906   CL 105 03/14/2018 0807   CL 102 10/29/2012 1014   CO2 29 03/14/2018 0807   CO2 25 01/19/2016 0906   GLUCOSE 103 (H) 03/14/2018 0807   GLUCOSE 104 01/19/2016 0906   GLUCOSE 95 10/29/2012 1014   BUN 18 03/14/2018 0807   BUN 15.4 01/19/2016 0906   CREATININE 0.99 03/14/2018 0807   CREATININE 0.9 01/19/2016 0906   CALCIUM 9.5 03/14/2018 0807   CALCIUM 9.5 01/19/2016 0906   GFRNONAA >60 02/23/2017 1105   GFRAA >60 02/23/2017 1105    BNP No results found for: BNP  ProBNP    Component Value Date/Time   PROBNP 51.8 01/05/2010 0405    Imaging: No results found.    Assessment & Plan:   Pleasant 72 year old patient seen for office visit today.  Patient with acute maxillary and frontal sinusitis.  Patient be treated with doxycycline and prednisone.  Patient to contact Coumadin clinic and primary care to discuss Coumadin dose as she is being started on tetracycline.  Patient endorses that she will contact them.  Patient reports she will get the flu vaccine before October 2019.  Maxillary sinusitis, acute Doxycycline >>> 1  100 mg tablet every 12 hours for 7 days >>>take with food  >>>wear sunscreen   Prednisone 15m tablet  >>>4 tabs for 2 days, then 3 tabs for 2 days, 2 tabs for 2 days, then 1 tab for 2 days, then stop >>>take with food  >>>take in the morning   Follow up with Coumadin clinic   Continue Advair Diskus   Keep follow up with Dr. YAnnamaria Bootsin February / 2020      BLauraine Rinne NP 04/04/2018

## 2018-04-04 ENCOUNTER — Telehealth: Payer: Self-pay | Admitting: Endocrinology

## 2018-04-04 ENCOUNTER — Encounter: Payer: Self-pay | Admitting: Pulmonary Disease

## 2018-04-04 ENCOUNTER — Ambulatory Visit (INDEPENDENT_AMBULATORY_CARE_PROVIDER_SITE_OTHER): Payer: Medicare Other | Admitting: Pulmonary Disease

## 2018-04-04 VITALS — BP 118/68 | HR 63 | Temp 98.1°F | Ht 67.5 in | Wt 232.4 lb

## 2018-04-04 DIAGNOSIS — J0100 Acute maxillary sinusitis, unspecified: Secondary | ICD-10-CM

## 2018-04-04 MED ORDER — PREDNISONE 10 MG PO TABS: ORAL_TABLET | ORAL | 0 refills | Status: DC

## 2018-04-04 MED ORDER — DOXYCYCLINE HYCLATE 100 MG PO TABS: 100.0000 mg | ORAL_TABLET | Freq: Two times a day (BID) | ORAL | 0 refills | Status: DC

## 2018-04-04 NOTE — Telephone Encounter (Signed)
Pt came by our office after visit with Pulmonary today and wanted to make you aware of her being put on Doxycycline and Prednisone.  She will see you next week for a coumadin visit.

## 2018-04-04 NOTE — Assessment & Plan Note (Signed)
Doxycycline >>> 1 100 mg tablet every 12 hours for 7 days >>>take with food  >>>wear sunscreen   Prednisone 10mg  tablet  >>>4 tabs for 2 days, then 3 tabs for 2 days, 2 tabs for 2 days, then 1 tab for 2 days, then stop >>>take with food  >>>take in the morning   Follow up with Coumadin clinic   Continue Advair Diskus   Keep follow up with Dr. Annamaria Boots in February / 2020

## 2018-04-04 NOTE — Patient Instructions (Addendum)
Doxycycline >>> 1 100 mg tablet every 12 hours for 7 days >>>take with food  >>>wear sunscreen   Prednisone 10mg  tablet  >>>4 tabs for 2 days, then 3 tabs for 2 days, 2 tabs for 2 days, then 1 tab for 2 days, then stop >>>take with food  >>>take in the morning   Follow up with Coumadin clinic   Continue Advair Diskus   Keep follow up with Dr. Annamaria Boots in February / 2020        It is flu season:   >>>Remember to be washing your hands regularly, using hand sanitizer, be careful to use around herself with has contact with people who are sick will increase her chances of getting sick yourself. >>> Best ways to protect herself from the flu: Receive the yearly flu vaccine, practice good hand hygiene washing with soap and also using hand sanitizer when available, eat a nutritious meals, get adequate rest, hydrate appropriately   Please contact the office if your symptoms worsen or you have concerns that you are not improving.   Thank you for choosing [facility] for your healthcare, and for allowing [place] to partner with you on your healthcare journey. I am thankful to be able to provide care to you today.   Wyn Quaker FNP-C

## 2018-04-05 NOTE — Telephone Encounter (Signed)
Noted! Thank you

## 2018-04-09 ENCOUNTER — Ambulatory Visit (INDEPENDENT_AMBULATORY_CARE_PROVIDER_SITE_OTHER): Payer: Medicare Other | Admitting: General Practice

## 2018-04-09 ENCOUNTER — Other Ambulatory Visit: Payer: Self-pay | Admitting: Endocrinology

## 2018-04-09 DIAGNOSIS — Z7901 Long term (current) use of anticoagulants: Secondary | ICD-10-CM | POA: Diagnosis not present

## 2018-04-09 LAB — POCT INR: INR: 4.2 — AB (ref 2.0–3.0)

## 2018-04-09 NOTE — Patient Instructions (Addendum)
Pre visit review using our clinic review tool, if applicable. No additional management support is needed unless otherwise documented below in the visit note.  Hold dosage tomorrow (9/11) and tomorrow (9/12) and then continue to take 2 tablets daily.  Re-check in 2  weeks.

## 2018-04-12 ENCOUNTER — Telehealth: Payer: Self-pay | Admitting: Endocrinology

## 2018-04-12 ENCOUNTER — Other Ambulatory Visit: Payer: Self-pay | Admitting: Endocrinology

## 2018-04-12 MED ORDER — HYDROCODONE-ACETAMINOPHEN 5-325 MG PO TABS: 1.0000 | ORAL_TABLET | Freq: Two times a day (BID) | ORAL | 0 refills | Status: AC | PRN

## 2018-04-12 NOTE — Telephone Encounter (Signed)
Pt stated seen last week to see Dr. Dwyane Dee supposed to write pain meds. Rx  Hydrocodone and it has not came through yet.

## 2018-04-12 NOTE — Telephone Encounter (Signed)
Notified pt Rx hydrocodone been sent to the pharmacy

## 2018-04-12 NOTE — Telephone Encounter (Signed)
Hydrocodone has been refilled

## 2018-04-15 ENCOUNTER — Other Ambulatory Visit: Payer: Self-pay | Admitting: Endocrinology

## 2018-04-17 ENCOUNTER — Telehealth: Payer: Self-pay | Admitting: Endocrinology

## 2018-04-17 ENCOUNTER — Other Ambulatory Visit: Payer: Self-pay

## 2018-04-17 MED ORDER — BACLOFEN 20 MG PO TABS: 20.0000 mg | ORAL_TABLET | Freq: Every day | ORAL | 1 refills | Status: DC

## 2018-04-17 NOTE — Telephone Encounter (Signed)
Patient is needing a refill sent into the pharmacy. She said that when [facility] over the medication was canceled and patient is unsure why this was cancelled please advise   baclofen (LIORESAL) 20 MG tablet

## 2018-04-18 ENCOUNTER — Other Ambulatory Visit: Payer: Self-pay | Admitting: Endocrinology

## 2018-04-19 ENCOUNTER — Telehealth: Payer: Self-pay | Admitting: Endocrinology

## 2018-04-19 MED ORDER — BACLOFEN 20 MG PO TABS: 20.0000 mg | ORAL_TABLET | Freq: Every day | ORAL | 1 refills | Status: DC

## 2018-04-19 NOTE — Telephone Encounter (Signed)
Is this okay to refill? 

## 2018-04-19 NOTE — Telephone Encounter (Signed)
Patient is requesting a call back from the nurse to discuss her medication. She stated that when she tried to get her [facility]ill [facility] told [facility] it was [facility]. Please advise   baclofen (LIORESAL) 20 MG tablet

## 2018-04-19 NOTE — Addendum Note (Signed)
Addended by: Drucilla Schmidt on: 04/19/2018 04:36 PM   Modules accepted: Orders

## 2018-04-19 NOTE — Telephone Encounter (Signed)
Please refill.

## 2018-04-19 NOTE — Telephone Encounter (Signed)
Medication sent.

## 2018-04-19 NOTE — Telephone Encounter (Signed)
Duplicate

## 2018-04-23 ENCOUNTER — Other Ambulatory Visit: Payer: Self-pay | Admitting: Endocrinology

## 2018-04-23 ENCOUNTER — Ambulatory Visit (INDEPENDENT_AMBULATORY_CARE_PROVIDER_SITE_OTHER): Payer: Medicare Other | Admitting: General Practice

## 2018-04-23 DIAGNOSIS — Z7901 Long term (current) use of anticoagulants: Secondary | ICD-10-CM

## 2018-04-23 DIAGNOSIS — Z23 Encounter for immunization: Secondary | ICD-10-CM

## 2018-04-23 LAB — POCT INR: INR: 2.7 (ref 2.0–3.0)

## 2018-04-23 MED ORDER — BACLOFEN 10 MG PO TABS: ORAL_TABLET | ORAL | 3 refills | Status: DC

## 2018-04-23 NOTE — Telephone Encounter (Signed)
Called pt to clarify medication Rx. Baclofen 10mg --taking twice daily a.m and evening. Rx Baclofen 20mg  1 tab at night. Pt stated need refill RX baclofen 10mg ---[facility]--[facility], R-3

## 2018-04-23 NOTE — Patient Instructions (Addendum)
Pre visit review using our clinic review tool, if applicable. No additional management support is needed unless otherwise documented below in the visit note.  Continue to take 2 tablets daily.  Re-check in 4 weeks. 

## 2018-05-18 ENCOUNTER — Other Ambulatory Visit: Payer: Self-pay

## 2018-05-18 ENCOUNTER — Encounter (HOSPITAL_COMMUNITY): Payer: Self-pay | Admitting: Emergency Medicine

## 2018-05-18 ENCOUNTER — Emergency Department (HOSPITAL_BASED_OUTPATIENT_CLINIC_OR_DEPARTMENT_OTHER): Payer: Medicare Other

## 2018-05-18 ENCOUNTER — Emergency Department (HOSPITAL_COMMUNITY): Payer: Medicare Other

## 2018-05-18 ENCOUNTER — Emergency Department (HOSPITAL_COMMUNITY)
Admission: EM | Admit: 2018-05-18 | Discharge: 2018-05-18 | Disposition: A | Discharge status: Home / Self Care | Payer: Medicare Other | Attending: Emergency Medicine | Admitting: Emergency Medicine

## 2018-05-18 DIAGNOSIS — J45909 Unspecified asthma, uncomplicated: Secondary | ICD-10-CM | POA: Diagnosis not present

## 2018-05-18 DIAGNOSIS — M79605 Pain in left leg: Secondary | ICD-10-CM | POA: Diagnosis not present

## 2018-05-18 DIAGNOSIS — I10 Essential (primary) hypertension: Secondary | ICD-10-CM | POA: Diagnosis not present

## 2018-05-18 DIAGNOSIS — Z7984 Long term (current) use of oral hypoglycemic drugs: Secondary | ICD-10-CM | POA: Insufficient documentation

## 2018-05-18 DIAGNOSIS — R52 Pain, unspecified: Secondary | ICD-10-CM | POA: Diagnosis not present

## 2018-05-18 DIAGNOSIS — Z87891 Personal history of nicotine dependence: Secondary | ICD-10-CM | POA: Insufficient documentation

## 2018-05-18 DIAGNOSIS — M545 Low back pain, unspecified: Secondary | ICD-10-CM

## 2018-05-18 DIAGNOSIS — M79662 Pain in left lower leg: Secondary | ICD-10-CM | POA: Diagnosis not present

## 2018-05-18 DIAGNOSIS — Z79899 Other long term (current) drug therapy: Secondary | ICD-10-CM | POA: Insufficient documentation

## 2018-05-18 DIAGNOSIS — Z853 Personal history of malignant neoplasm of breast: Secondary | ICD-10-CM | POA: Diagnosis not present

## 2018-05-18 DIAGNOSIS — E039 Hypothyroidism, unspecified: Secondary | ICD-10-CM | POA: Diagnosis not present

## 2018-05-18 DIAGNOSIS — E119 Type 2 diabetes mellitus without complications: Secondary | ICD-10-CM | POA: Diagnosis not present

## 2018-05-18 DIAGNOSIS — M160 Bilateral primary osteoarthritis of hip: Secondary | ICD-10-CM | POA: Diagnosis not present

## 2018-05-18 DIAGNOSIS — R5381 Other malaise: Secondary | ICD-10-CM | POA: Diagnosis not present

## 2018-05-18 DIAGNOSIS — M25552 Pain in left hip: Secondary | ICD-10-CM | POA: Diagnosis not present

## 2018-05-18 LAB — CBC WITH DIFFERENTIAL/PLATELET
ABS IMMATURE GRANULOCYTES: 0.02 10*3/uL (ref 0.00–0.07)
Basophils Absolute: 0.1 10*3/uL (ref 0.0–0.1)
Basophils Relative: 1 %
Eosinophils Absolute: 0.1 10*3/uL (ref 0.0–0.5)
Eosinophils Relative: 1 %
HCT: 37.1 % (ref 36.0–46.0)
HEMOGLOBIN: 11.9 g/dL — AB (ref 12.0–15.0)
Immature Granulocytes: 0 %
LYMPHS PCT: 28 %
Lymphs Abs: 1.8 10*3/uL (ref 0.7–4.0)
MCH: 28.1 pg (ref 26.0–34.0)
MCHC: 32.1 g/dL (ref 30.0–36.0)
MCV: 87.5 fL (ref 80.0–100.0)
MONO ABS: 0.3 10*3/uL (ref 0.1–1.0)
MONOS PCT: 5 %
NEUTROS ABS: 4.1 10*3/uL (ref 1.7–7.7)
Neutrophils Relative %: 65 %
Platelets: 148 10*3/uL — ABNORMAL LOW (ref 150–400)
RBC: 4.24 MIL/uL (ref 3.87–5.11)
RDW: 13.2 % (ref 11.5–15.5)
WBC: 6.4 10*3/uL (ref 4.0–10.5)
nRBC: 0 % (ref 0.0–0.2)

## 2018-05-18 LAB — I-STAT CHEM 8, ED
BUN: 12 mg/dL (ref 8–23)
CREATININE: 0.6 mg/dL (ref 0.44–1.00)
Calcium, Ion: 1.17 mmol/L (ref 1.15–1.40)
Chloride: 107 mmol/L (ref 98–111)
GLUCOSE: 110 mg/dL — AB (ref 70–99)
HEMATOCRIT: 36 % (ref 36.0–46.0)
HEMOGLOBIN: 12.2 g/dL (ref 12.0–15.0)
POTASSIUM: 3.9 mmol/L (ref 3.5–5.1)
SODIUM: 139 mmol/L (ref 135–145)
TCO2: 27 mmol/L (ref 22–32)

## 2018-05-18 LAB — PROTIME-INR
INR: 1.68
Prothrombin Time: 19.6 seconds — ABNORMAL HIGH (ref 11.4–15.2)

## 2018-05-18 MED ORDER — HYDROMORPHONE HCL 1 MG/ML IJ SOLN
1.0000 mg | Freq: Once | INTRAMUSCULAR | Status: AC
  Administered 2018-05-18: 1 mg via INTRAMUSCULAR

## 2018-05-18 MED ORDER — HYDROMORPHONE HCL 1 MG/ML IJ SOLN
0.5000 mg | Freq: Once | INTRAMUSCULAR | Status: DC
  Filled 2018-05-18: qty 1

## 2018-05-18 MED ORDER — CYCLOBENZAPRINE HCL 10 MG PO TABS: 10.0000 mg | ORAL_TABLET | Freq: Two times a day (BID) | ORAL | 0 refills | Status: DC | PRN

## 2018-05-18 MED ORDER — PREDNISONE 20 MG PO TABS: ORAL_TABLET | ORAL | 0 refills | Status: DC

## 2018-05-18 NOTE — ED Provider Notes (Signed)
[facility] DEPT Provider Note   CSN: [id number] Arrival date & time: 05/18/18  1103     History   Chief Complaint Chief Complaint  Patient presents with  . Back Pain  . Leg Pain    HPI Kelli Martinez is a 72 y.o. female.  The history is provided by the patient and medical records. No language interpreter was used.  Back Pain   Associated symptoms include leg pain. Pertinent negatives include no fever and no numbness.  Leg Pain   Pertinent negatives include no numbness.     72 year old female with history of chronic back pain, arthritis, orbit obesity, clotting disorder with prior DVT and PE presenting for evaluation of left hip and leg pain.  Patient developed gradual onset of pain starting from her lower back to left hip down to the left leg which started yesterday.  Pain is described as a sharp shooting pain, persistent, worsening with movement.  Pain is traveling down her leg.  Putting pressure on it makes it worse.  States pain is 10 out of 10 not adequately relieved despite taking her hydrocodone earlier.  No associated fever or chills, no bowel bladder incontinence or saddle anesthesia.  She denies any recent injury but did report that she went out shopping and may have been on her feet longer than usual.  She does mention having bulging disc in the back.  She is currently on Coumadin for her blood clots and states the last time it was checked was approximately a month ago and it was normal.       Past Medical History:  Diagnosis Date  . Allergy   . Anemia   . Arthritis   . Asthma   . Back pain, chronic    GETS INJECTIONS IN BACK  . Blind right eye    hemorrhage  . Blood transfusion   . Cancer ([place])    breast 1994  . Clotting disorder ([place])   . Diabetes mellitus without complication ([place])    takes precose  . Elevated liver enzymes   . Gallstones   . GERD (gastroesophageal reflux disease)   . HX: breast cancer   .  Hyperlipidemia   . Hypertension   . Hypothyroid   . Lymphedema of arm    RT  . Morbid obesity ([place])   . OSA (obstructive sleep apnea)    has not used in 2 years-lost weight  . Osteoporosis   . Peripheral neuropathy    on gabapentin  . PONV (postoperative nausea and vomiting)   . Psoriasis   . Pulmonary embolism ([place]) 1998 / 1994 /1968  . Syncope and collapse 2011   due to anemia    Patient Active Problem List   Diagnosis Date Noted  . Hypoglycemia following gastrointestinal surgery 03/19/2018  . Edema of both lower extremities due to peripheral venous insufficiency 11/20/2017  . Maxillary sinusitis, acute 08/31/2017  . Encounter for therapeutic drug monitoring 08/26/2013  . Insomnia 05/01/2013  . Long term (current) use of anticoagulants 04/15/2013  . Postsurgical dumping syndrome 04/06/2013  . Bilateral leg edema 04/06/2013  . Reactive hypoglycemia 04/02/2013  . Abdominal pain intermittent-evalulating for internal hernia 11/07/2012  . S/P laparoscopic cholecystectomy 05/31/2012  . Roux Y Gastric Bypass April 2011; repair [facility] 11/22/2011  . Unspecified deficiency anemia 11/14/2011  . Left leg paresthesias 10/27/2010  . Hypothyroid 10/27/2010  . Low back pain 10/27/2010  . Obstructive sleep apnea 09/29/2007  . PULMONARY EMBOLISM 09/25/2007  .  Seasonal and perennial allergic rhinitis 09/25/2007  . Allergic asthma, mild persistent, uncomplicated 53/29/9242  . BREAST CANCER, HX OF 09/25/2007    Past Surgical History:  Procedure Laterality Date  . ABDOMINAL HYSTERECTOMY    . BONE MARROW TRANSPLANT  1994  . CARDIOVASCULAR STRESS TEST  05/23/2005   EF 53%  . carpal tunnell     bil  . cataracts    . CHOLECYSTECTOMY  05/10/2012   Procedure: LAPAROSCOPIC CHOLECYSTECTOMY WITH INTRAOPERATIVE CHOLANGIOGRAM;  Surgeon: Pedro Earls, MD;  Location: [facility];  Service: General;  Laterality: N/A;  Laparoscopic Cholecystectomy with Intraoperative Cholangiogram  . GASTRIC BYPASS   2011   bariatric surgery  . HIATAL HERNIA REPAIR    . IVC filter     recurrent DVT  . KNEE ARTHROTOMY  1998  . MASS EXCISION Right 11/24/2016   Procedure: EXCISION RIGHT AXILLARY LESION AND CHEST WALL LESION;  Surgeon: Johnathan Hausen, MD;  Location: [facility];  Service: General;  Laterality: Right;  . MASTECTOMY     Right  . US ECHOCARDIOGRAPHY  10/27/08   EF 55-60%     OB History   None      Home Medications    Prior to Admission medications   Medication Sig Start Date End Date Taking? Authorizing Provider  acarbose (PRECOSE) 25 MG tablet 1 tablet at the start of breakfast and dinner and 2 tablets at lunch 03/19/18   Elayne Snare, MD  albuterol [facility] HFA) 108 (90 Base) MCG/ACT inhaler USE 2 INHALATIONS FOUR TIMES A DAY AS NEEDED ( AS NEEDED RESCUE INHALER) 08/31/17   Deneise Lever, MD  baclofen (LIORESAL) 10 MG tablet Take 1 tablet twice a day 04/23/18   Elayne Snare, MD  baclofen (LIORESAL) 20 MG tablet Take 1 tablet (20 mg total) by mouth daily. TAKE 1 TABLET 3 TIMES A DAY 04/19/18   Elayne Snare, MD  Blood Glucose Monitoring Suppl (FREESTYLE FREEDOM LITE) w/Device KIT use as directed 12/25/17   Elayne Snare, MD  Blood Glucose Monitoring Suppl (FREESTYLE LITE) DEVI Use to check blood sugar once a day DX code E11.65 04/12/15   Elayne Snare, MD  Calcium Carbonate-Vitamin D (CALTRATE 600+D) 600-400 MG-UNIT tablet Take 1 tablet by mouth daily.    [provider]  clobetasol cream (TEMOVATE) 6.83 % Apply 1 application topically 2 (two) times daily. 11/20/17   Elayne Snare, MD  diazepam (DIASTAT ACUDIAL) 10 MG GEL Place 5 mg rectally once as needed (cramping). 02/13/17   Drenda Freeze, MD  doxycycline (VIBRA-TABS) 100 MG tablet Take 1 tablet (100 mg total) by mouth 2 (two) times daily. 04/04/18   Lauraine Rinne, NP  ferrous sulfate 325 (65 FE) MG tablet Take 325 mg by mouth daily.      [provider]  fluticasone (FLONASE) 50 MCG/ACT nasal spray Place 2 sprays  into both nostrils daily. 08/31/17   Baird Lyons D, MD  Fluticasone-Salmeterol (ADVAIR DISKUS) 100-50 MCG/DOSE AEPB USE 1 INHALATION EVERY 12 HOURS, RINSE MOUTH 08/31/17   Baird Lyons D, MD  FREESTYLE LITE test strip USE AS INSTRUCTED TO CHECK BLOOD SUGAR ONCE DAILY 11/13/17   Elayne Snare, MD  furosemide (LASIX) 40 MG tablet TAKE 1 TABLET EVERY MORNING Patient taking differently: 20 mg.  12/20/17   Elayne Snare, MD  gabapentin (NEURONTIN) 300 MG capsule TAKE 2 CAPSULES THREE TIMES A DAY 01/17/18   Patel, Donika K, DO  HYDROcodone-acetaminophen (NORCO) 5-325 MG tablet Take 1 tablet by mouth 2 (two)  times daily as needed for moderate pain. 04/12/18   Elayne Snare, MD  Lancets (FREESTYLE) lancets USE AS INSTRUCTED TO CHECK BLOOD SUGAR ONCE DAILY 11/13/17   Elayne Snare, MD  levothyroxine (SYNTHROID, LEVOTHROID) 125 MCG tablet TAKE 1 TABLET DAILY BEFORE BREAKFAST 04/16/18   Elayne Snare, MD  Magnesium Cl-Calcium Carbonate (SLOW-MAG PO) Take 500 mg by mouth.    [provider]  metFORMIN (GLUCOPHAGE) 500 MG tablet Take 1 tablet (500 mg total) by mouth at bedtime. 07/03/17   Elayne Snare, MD  methylcellulose (ARTIFICIAL TEARS) 1 % ophthalmic solution Place 1 drop into both eyes as needed. Dry eyes    [provider]  omeprazole (PRILOSEC) 20 MG capsule TAKE 1 CAPSULE DAILY 04/09/18   Elayne Snare, MD  pravastatin (PRAVACHOL) 40 MG tablet Take 1 tablet (40 mg total) by mouth daily. 03/19/18   Elayne Snare, MD  predniSONE (DELTASONE) 10 MG tablet 4 tabs for 2 days, then 3 tabs for 2 days, 2 tabs for 2 days, then 1 tab for 2 days, then stop 04/04/18   Lauraine Rinne, NP  spironolactone (ALDACTONE) 100 MG tablet 1 tablet in a.m. and half tablet in afternoon Patient taking differently: 1 tablet in a.m. and 2 tablets in afternoon 03/19/18   Elayne Snare, MD  temazepam (RESTORIL) 15 MG capsule 1-2 for sleep as needed 08/31/17   Deneise Lever, MD  vitamin B-12 (CYANOCOBALAMIN) 500 MCG tablet Take 500 mcg by  mouth daily.      [provider]  Vitamin D, Ergocalciferol, (DRISDOL) 50000 units CAPS capsule TAKE 1 CAPSULE EVERY FRIDAY 12/28/17   Elayne Snare, MD  warfarin (COUMADIN) 5 MG tablet TAKE 2 TABLETS DAILY EXCEPT TAKE 3 TABLETS ON MONDAYS ONLY OR AS DIRECTED BY ANTICOAGULATION CLINIC 04/18/18   Elayne Snare, MD    Family History Family History  Problem Relation Age of Onset  . Sudden death Mother        car accident  . Cancer Father 23       lung  . Cancer Sister 65       lung cancer  . Asthma Daughter     Social History Social History   Tobacco Use  . Smoking status: Former Smoker    Packs/day: 0.50    Years: 10.00    Pack years: 5.00    Types: Cigarettes    Last attempt to quit: 07/31/1978    Years since quitting: 39.8  . Smokeless tobacco: Never Used  Substance Use Topics  . Alcohol use: No  . Drug use: No     Allergies   Penicillins and Adhesive [tape]   Review of Systems Review of Systems  Constitutional: Negative for fever.  Musculoskeletal: Positive for back pain.  Skin: Negative for wound.  Neurological: Negative for numbness.     Physical Exam Updated Vital Signs BP 137/68 (BP Location: Left Arm)   Pulse (!) 59   Temp 97.6 F (36.4 C) (Oral)   Resp 18   SpO2 100%   Physical Exam  Constitutional: She appears well-developed and well-nourished. No distress.  Morbidly obese female laying in bed nontoxic in appearance  HENT:  Head: Atraumatic.  Eyes: Conjunctivae are normal.  Neck: Neck supple.  Cardiovascular: Intact distal pulses.  Abdominal: Soft. There is no tenderness.  Musculoskeletal: She exhibits edema (Edema to bilateral lower extremity but difficult to appreciate due to large body habitus.) and tenderness (Tenderness along lumbar spine and left paralumbar spinal muscle.  Positive straight leg raise.).  Neurological: She is alert.  Skin: No rash noted.  Psychiatric: She has a normal mood and affect.  Nursing note and vitals  reviewed.    ED Treatments / Results  Labs (all labs ordered are listed, but only abnormal results are displayed) Labs Reviewed  CBC WITH DIFFERENTIAL/PLATELET - Abnormal; Notable for the following components:      Result Value   Hemoglobin 11.9 (*)    Platelets 148 (*)    All other components within normal limits  PROTIME-INR - Abnormal; Notable for the following components:   Prothrombin Time 19.6 (*)    All other components within normal limits  I-STAT CHEM 8, ED - Abnormal; Notable for the following components:   Glucose, Bld 110 (*)    All other components within normal limits    EKG None  Radiology Dg Hip Unilat W Or Wo Pelvis 2-3 Views Left  Result Date: 05/18/2018 CLINICAL DATA:  Chronic left hip pain, worsening.  No known injury. EXAM: DG HIP (WITH OR WITHOUT PELVIS) 2-3V LEFT COMPARISON:  None. FINDINGS: There is no acute bony or joint abnormality. Joint spaces are preserved about the hips. Moderate degenerative change at the symphysis pubis is noted. No focal bony lesion. Partial visualization of an IVC filter is noted. IMPRESSION: No acute abnormality. Mild bilateral hip osteoarthritis and moderate degenerative disease at the symphysis pubis. Electronically Signed   By: Inge Rise M.D.   On: 05/18/2018 14:51    Procedures Procedures (including critical care time)  LLE venous duplex prelim: negative for DVT.  Landry Mellow, RDMS, RVT  Medications Ordered in ED Medications  HYDROmorphone (DILAUDID) injection 1 mg (1 mg Intramuscular Given 05/18/18 1314)     Initial Impression / Assessment and Plan / ED Course  I have reviewed the triage vital signs and the nursing notes.  Pertinent labs & imaging results that were available during my care of the patient were reviewed by me and considered in my medical decision making (see chart for details).     BP 137/68 (BP Location: Left Arm)   Pulse (!) 59   Temp 97.6 F (36.4 C) (Oral)   Resp 18   SpO2 100%      Final Clinical Impressions(s) / ED Diagnoses   Final diagnoses:  Lumbar pain with radiation down left leg    ED Discharge Orders         Ordered    predniSONE (DELTASONE) 20 MG tablet     05/18/18 1501    cyclobenzaprine (FLEXERIL) 10 MG tablet  2 times daily PRN     05/18/18 1501         12:05 PM Patient here with radicular left leg pain suggestive of sciatica likely precipitated by increased walking yesterday when she went shopping.  No evidence to suggest infectious etiology at this time.  However she does have history of clotting disorder therefore I will obtain a duplex venous ultrasound to rule out acute DVT.  Pain medication given.  2:57 PM Patient is subtherapeutic at her INR level.  A venous Doppler ultrasound obtained of her left lower extremity is negative for DVT.  X-ray of her left hip and pelvis demonstrating degenerative changes and arthritis but no acute changes.  Prior lumbar x-ray shows degenerative changes as well.  She is neurovascularly intact.  Suspect sciatic pain causing symptoms.  Patient received pain medication here and did show improvement of her symptoms.  At this time she is stable for discharge with symptom medic treatment.  Care discussed with Dr. Thurnell Garbe.   Domenic Moras, PA-C 05/18/18 [place], [place], DO 05/19/18 [phone]

## 2018-05-18 NOTE — Discharge Instructions (Signed)
Your pain is likely due to a pinch nerve.  Please take steroid and muscle relaxant along with your home pain medication for further care.  Follow up with your doctor.

## 2018-05-18 NOTE — ED Notes (Signed)
Bed: WA09 Expected date:  Expected time:  Means of arrival:  Comments: 72 yo back pain, leg pain

## 2018-05-18 NOTE — ED Triage Notes (Signed)
Pt c/o acute lower back pain and left leg/hip pain that started yesterday. Ambulates with walker and was able to ambulate to EMS stretcher.

## 2018-05-18 NOTE — Progress Notes (Signed)
Arrived at room. RN is at bedside and stated it will be a few minutes. Will check back.   Landry Mellow, RDMS, RVT

## 2018-05-18 NOTE — Progress Notes (Signed)
LLE venous duplex prelim: negative for DVT. Hartlyn Reigel Eunice, RDMS, RVT  

## 2018-05-18 NOTE — ED Notes (Addendum)
[place] IV attempts x 4 unsuccessful.

## 2018-05-21 ENCOUNTER — Ambulatory Visit: Payer: Medicare Other

## 2018-05-23 DIAGNOSIS — M5416 Radiculopathy, lumbar region: Secondary | ICD-10-CM | POA: Diagnosis not present

## 2018-05-24 ENCOUNTER — Ambulatory Visit: Payer: Medicare Other

## 2018-05-24 ENCOUNTER — Other Ambulatory Visit (HOSPITAL_COMMUNITY): Payer: Self-pay | Admitting: Chiropractic Medicine

## 2018-05-24 DIAGNOSIS — M5416 Radiculopathy, lumbar region: Secondary | ICD-10-CM

## 2018-05-27 ENCOUNTER — Ambulatory Visit (HOSPITAL_COMMUNITY)
Admission: RE | Admit: 2018-05-27 | Discharge: 2018-05-27 | Disposition: A | Discharge status: Home / Self Care | Payer: Medicare Other | Source: Ambulatory Visit | Attending: Chiropractic Medicine | Admitting: Chiropractic Medicine

## 2018-05-27 DIAGNOSIS — M48061 Spinal stenosis, lumbar region without neurogenic claudication: Secondary | ICD-10-CM | POA: Diagnosis not present

## 2018-05-27 DIAGNOSIS — M5416 Radiculopathy, lumbar region: Secondary | ICD-10-CM | POA: Diagnosis not present

## 2018-05-27 DIAGNOSIS — M5136 Other intervertebral disc degeneration, lumbar region: Secondary | ICD-10-CM | POA: Diagnosis not present

## 2018-05-28 DIAGNOSIS — I739 Peripheral vascular disease, unspecified: Secondary | ICD-10-CM | POA: Diagnosis not present

## 2018-05-28 DIAGNOSIS — L84 Corns and callosities: Secondary | ICD-10-CM | POA: Diagnosis not present

## 2018-05-28 DIAGNOSIS — L603 Nail dystrophy: Secondary | ICD-10-CM | POA: Diagnosis not present

## 2018-05-28 DIAGNOSIS — E1151 Type 2 diabetes mellitus with diabetic peripheral angiopathy without gangrene: Secondary | ICD-10-CM | POA: Diagnosis not present

## 2018-05-29 DIAGNOSIS — M4310 Spondylolisthesis, site unspecified: Secondary | ICD-10-CM | POA: Diagnosis not present

## 2018-05-29 DIAGNOSIS — M5136 Other intervertebral disc degeneration, lumbar region: Secondary | ICD-10-CM | POA: Diagnosis not present

## 2018-05-29 DIAGNOSIS — M545 Low back pain: Secondary | ICD-10-CM | POA: Diagnosis not present

## 2018-05-29 DIAGNOSIS — M48061 Spinal stenosis, lumbar region without neurogenic claudication: Secondary | ICD-10-CM | POA: Diagnosis not present

## 2018-05-31 ENCOUNTER — Ambulatory Visit: Payer: Medicare Other

## 2018-05-31 ENCOUNTER — Ambulatory Visit (INDEPENDENT_AMBULATORY_CARE_PROVIDER_SITE_OTHER): Payer: Medicare Other | Admitting: General Practice

## 2018-05-31 DIAGNOSIS — Z7901 Long term (current) use of anticoagulants: Secondary | ICD-10-CM | POA: Diagnosis not present

## 2018-05-31 LAB — POCT INR: INR: 3.2 — AB (ref 2.0–3.0)

## 2018-05-31 NOTE — Patient Instructions (Addendum)
Pre visit review using our clinic review tool, if applicable. No additional management support is needed unless otherwise documented below in the visit note.  Hold coumadin today and then continue to take 2 tablets daily.  Re-check in 4  weeks.

## 2018-05-31 NOTE — Progress Notes (Signed)
Agree with management.  Braylyn Kalter J Teofil Maniaci, MD  

## 2018-06-03 ENCOUNTER — Telehealth: Payer: Self-pay | Admitting: Endocrinology

## 2018-06-03 NOTE — Telephone Encounter (Signed)
Medical clearance been sign and faxed [phone]

## 2018-06-07 ENCOUNTER — Telehealth: Payer: Self-pay | Admitting: General Practice

## 2018-06-07 NOTE — Telephone Encounter (Signed)
-----   Message from Elayne Snare, MD sent at 06/07/2018 11:28 AM EST ----- Regarding: RE: Coumadin Yes, I have already sent a message to the surgeon  ----- Message ----- From: Warden Fillers, RN Sent: 06/07/2018  10:18 AM EST To: Elayne Snare, MD, Warden Fillers, RN Subject: Coumadin                                       Dr. Dwyane Dee,  Patient is going to have a ESI - Epidural steroid injection and will need to stop coumadin for 5 days.  Are you OK with this?  Please advise.  Thanks! Villa Herb, RN [facility]

## 2018-06-10 ENCOUNTER — Other Ambulatory Visit: Payer: Self-pay | Admitting: Endocrinology

## 2018-06-11 ENCOUNTER — Telehealth: Payer: Self-pay | Admitting: General Practice

## 2018-06-11 NOTE — Telephone Encounter (Signed)
Patient does not need to stop coumadin for injection per Manuela Schwartz at Dr. Jeralyn Ruths office.  Patient has been notified.

## 2018-06-14 ENCOUNTER — Other Ambulatory Visit: Payer: Self-pay | Admitting: Endocrinology

## 2018-06-14 DIAGNOSIS — Z09 Encounter for follow-up examination after completed treatment for conditions other than malignant neoplasm: Secondary | ICD-10-CM | POA: Diagnosis not present

## 2018-06-17 ENCOUNTER — Other Ambulatory Visit (INDEPENDENT_AMBULATORY_CARE_PROVIDER_SITE_OTHER): Payer: Medicare Other

## 2018-06-17 DIAGNOSIS — R7989 Other specified abnormal findings of blood chemistry: Secondary | ICD-10-CM

## 2018-06-17 DIAGNOSIS — E1142 Type 2 diabetes mellitus with diabetic polyneuropathy: Secondary | ICD-10-CM

## 2018-06-17 DIAGNOSIS — Z5181 Encounter for therapeutic drug level monitoring: Secondary | ICD-10-CM

## 2018-06-17 DIAGNOSIS — E039 Hypothyroidism, unspecified: Secondary | ICD-10-CM | POA: Diagnosis not present

## 2018-06-17 DIAGNOSIS — R945 Abnormal results of liver function studies: Secondary | ICD-10-CM

## 2018-06-17 DIAGNOSIS — E559 Vitamin D deficiency, unspecified: Secondary | ICD-10-CM | POA: Diagnosis not present

## 2018-06-17 LAB — COMPREHENSIVE METABOLIC PANEL
ALK PHOS: 71 U/L (ref 39–117)
ALT: 26 U/L (ref 0–35)
AST: 23 U/L (ref 0–37)
Albumin: 4 g/dL (ref 3.5–5.2)
BILIRUBIN TOTAL: 0.4 mg/dL (ref 0.2–1.2)
BUN: 18 mg/dL (ref 6–23)
CO2: 27 mEq/L (ref 19–32)
Calcium: 9.4 mg/dL (ref 8.4–10.5)
Chloride: 97 mEq/L (ref 96–112)
Creatinine, Ser: 0.96 mg/dL (ref 0.40–1.20)
GFR: 73.78 mL/min (ref >60.00)
GLUCOSE: 121 mg/dL — AB (ref 70–99)
Potassium: 4.6 mEq/L (ref 3.5–5.1)
SODIUM: 132 meq/L — AB (ref 135–145)
TOTAL PROTEIN: 7.1 g/dL (ref 6.0–8.3)

## 2018-06-17 LAB — LIPID PANEL
Cholesterol: 173 mg/dL (ref 0–200)
HDL: 90.5 mg/dL (ref >39.00)
LDL Cholesterol: 72 mg/dL (ref 0–99)
NONHDL: 82.87
Total CHOL/HDL Ratio: 2
Triglycerides: 55 mg/dL (ref 0.0–149.0)
VLDL: 11 mg/dL (ref 0.0–40.0)

## 2018-06-17 LAB — VITAMIN D 25 HYDROXY (VIT D DEFICIENCY, FRACTURES): VITD: 56.59 ng/mL (ref 30.00–100.00)

## 2018-06-17 LAB — T4, FREE: FREE T4: 1.26 ng/dL (ref 0.60–1.60)

## 2018-06-17 LAB — TSH: TSH: 0.46 u[IU]/mL (ref 0.35–4.50)

## 2018-06-17 LAB — HEMOGLOBIN A1C: Hgb A1c MFr Bld: 7.1 % — ABNORMAL HIGH (ref 4.6–6.5)

## 2018-06-18 ENCOUNTER — Other Ambulatory Visit: Payer: Self-pay | Admitting: Endocrinology

## 2018-06-18 MED ORDER — PRAVASTATIN SODIUM 40 MG PO TABS: 40.0000 mg | ORAL_TABLET | Freq: Every day | ORAL | 1 refills | Status: DC

## 2018-06-19 ENCOUNTER — Encounter: Payer: Self-pay | Admitting: Endocrinology

## 2018-06-19 ENCOUNTER — Ambulatory Visit (INDEPENDENT_AMBULATORY_CARE_PROVIDER_SITE_OTHER): Payer: Medicare Other | Admitting: Endocrinology

## 2018-06-19 VITALS — BP 110/74 | HR 74 | Ht 69.0 in | Wt 229.0 lb

## 2018-06-19 DIAGNOSIS — E1142 Type 2 diabetes mellitus with diabetic polyneuropathy: Secondary | ICD-10-CM | POA: Diagnosis not present

## 2018-06-19 DIAGNOSIS — I872 Venous insufficiency (chronic) (peripheral): Secondary | ICD-10-CM | POA: Diagnosis not present

## 2018-06-19 DIAGNOSIS — E161 Other hypoglycemia: Secondary | ICD-10-CM

## 2018-06-19 DIAGNOSIS — E039 Hypothyroidism, unspecified: Secondary | ICD-10-CM | POA: Diagnosis not present

## 2018-06-19 MED ORDER — SPIRONOLACTONE 50 MG PO TABS: ORAL_TABLET | ORAL | 1 refills | Status: DC

## 2018-06-19 NOTE — Patient Instructions (Addendum)
Take Spironolactone 100mg   2 pills 1 day and 1 the next  Lasix 20mg    2 Metformin at bedtime

## 2018-06-19 NOTE — Progress Notes (Signed)
Patient ID: Kelli Martinez, female   DOB: 09-14-1945, 72 y.o.   MRN: [id number]    Chief complaint: Followup of various issues   History of Present Illness:  The patient is seen for the following problems:  1.  Postprandial hypoglycemia related to dumping syndrome   She has had postprandial hypoglycemia starting a couple of years after her gastric bypass surgery Her symptoms of blood sugars are mostly a feeling of significant weakness, some shakiness.  She will use glucose tablets or juice in order to relieve the symptoms, recently she thinks that 1 or 2 glucose tablets are usually adequate She has been to the dietitian and has been instructed on balanced low-fat meals with enough protein consistently and restricting carbohydrates including fruits   Recent history:  She has been treated with acarbose with reduction in frequency of hypoglycemia  She did try verapamil empirically but this did not help.  She is taking her Precose when she starts eating, 1 tablet at breakfast 2 at lunch and 1 at dinner  She thinks her blood sugars have not been low and only rarely has symptoms lately She does try to have some protein with each meals, may have yogurt or boost in the morning  2.  SWELLING of the legs:  She has had significant problems with her legs swelling before her last visit She was told to take 150 mg of ALDACTONE daily but on her own she is taking 300 mg Also will take 20 mg of Lasix at the same time She is not cutting her Aldactone because she has difficulty breaking the round tablets in half Her swelling is better  Renal function and potassium are stable with this  Lab Results  Component Value Date   K 4.6 06/17/2018     PROBLEM 3:  DIABETES:  This has been mild and well controlled since her gastric bypass surgery  The A1c has been 6.8-6.9 in the past, most recently 7.1 compared to previous level of 7.2  Current management, blood sugar  patterns:  She did not bring her blood sugar monitor  Not clear how often she checks her sugars but usually infrequently  She think blood sugars are between 105 and 120 both morning or after lunch  Lab glucose was previously not as high fasting  Her weight has leveled off currently  Because of her back pain and sciatica over the last 2 months has not exercised Lab fasting glucose was 121  She is taking METFORMIN 500 mg at bedtime that was started when her A1c was higher Also taking Precose at each meal also to prevent reactive hypoglycemia    Wt Readings from Last 3 Encounters:  06/19/18 229 lb (103.9 kg)  04/04/18 232 lb 6.4 oz (105.4 kg)  03/19/18 241 lb 3.2 oz (109.4 kg)    Lab Results  Component Value Date   HGBA1C 7.1 (H) 06/17/2018   HGBA1C 7.2 (H) 01/15/2018   HGBA1C 6.8 (H) 09/17/2017   Lab Results  Component Value Date   MICROALBUR <0.7 01/15/2018   LDLCALC 72 06/17/2018   CREATININE 0.96 06/17/2018          Other active problems: see review of systems      Allergies as of 06/19/2018      Reactions   Adhesive [tape] Rash      Medication List        Accurate as of 06/19/18  9:13 AM. Always use your most recent med list.  acarbose 25 MG tablet Commonly known as:  PRECOSE 1 tablet at the start of breakfast and dinner and 2 tablets at lunch   albuterol 108 (90 Base) MCG/ACT inhaler Commonly known as:  PROVENTIL HFA;VENTOLIN HFA USE 2 INHALATIONS FOUR TIMES A DAY AS NEEDED ( AS NEEDED RESCUE INHALER)   baclofen 20 MG tablet Commonly known as:  LIORESAL Take 1 tablet (20 mg total) by mouth daily. TAKE 1 TABLET 3 TIMES A DAY   baclofen 10 MG tablet Commonly known as:  LIORESAL Take 1 tablet twice a day   CALTRATE 600+D 600-400 MG-UNIT tablet Generic drug:  Calcium Carbonate-Vitamin D Take 1 tablet by mouth daily.   clobetasol cream 0.05 % Commonly known as:  TEMOVATE Apply 1 application topically 2 (two) times daily.    diazepam 10 MG Gel Commonly known as:  DIASTAT ACUDIAL Place 5 mg rectally once as needed (cramping).   ferrous sulfate 325 (65 FE) MG tablet Take 325 mg by mouth daily.   fluticasone 50 MCG/ACT nasal spray Commonly known as:  FLONASE Place 2 sprays into both nostrils daily.   Fluticasone-Salmeterol 100-50 MCG/DOSE Aepb Commonly known as:  ADVAIR USE 1 INHALATION EVERY 12 HOURS, RINSE MOUTH   freestyle lancets USE AS INSTRUCTED TO CHECK BLOOD SUGAR ONCE DAILY   FREESTYLE LITE Devi Use to check blood sugar once a day DX code E11.65   FREESTYLE FREEDOM LITE w/Device Kit use as directed   FREESTYLE LITE test strip Generic drug:  glucose blood USE AS INSTRUCTED TO CHECK BLOOD SUGAR ONCE DAILY   furosemide 40 MG tablet Commonly known as:  LASIX TAKE 1 TABLET EVERY MORNING   gabapentin 300 MG capsule Commonly known as:  NEURONTIN TAKE 2 CAPSULES THREE TIMES A DAY   HYDROcodone-acetaminophen 5-325 MG tablet Commonly known as:  NORCO/VICODIN Take 1 tablet by mouth 2 (two) times daily as needed for moderate pain.   levothyroxine 125 MCG tablet Commonly known as:  SYNTHROID, LEVOTHROID TAKE 1 TABLET DAILY BEFORE BREAKFAST   metFORMIN 500 MG tablet Commonly known as:  GLUCOPHAGE TAKE 1 TABLET AT BEDTIME   methylcellulose 1 % ophthalmic solution Commonly known as:  ARTIFICIAL TEARS Place 1 drop into both eyes as needed. Dry eyes   omeprazole 20 MG capsule Commonly known as:  PRILOSEC TAKE 1 CAPSULE DAILY   pravastatin 40 MG tablet Commonly known as:  PRAVACHOL Take 1 tablet (40 mg total) by mouth daily.   SLOW-MAG PO Take 500 mg by mouth.   spironolactone 100 MG tablet Commonly known as:  ALDACTONE 1 tablet in a.m. and half tablet in afternoon   temazepam 15 MG capsule Commonly known as:  RESTORIL 1-2 for sleep as needed   vitamin B-12 500 MCG tablet Commonly known as:  CYANOCOBALAMIN Take 500 mcg by mouth daily.   Vitamin D (Ergocalciferol) 1.25 MG  (50000 UT) Caps capsule Commonly known as:  DRISDOL TAKE 1 CAPSULE EVERY FRIDAY   warfarin 5 MG tablet Commonly known as:  COUMADIN Take as directed by the anticoagulation clinic. If you are unsure how to take this medication, talk to your nurse or doctor. Original instructions:  TAKE 2 TABLETS DAILY EXCEPT TAKE 3 TABLETS ON MONDAYS ONLY OR AS DIRECTED BY ANTICOAGULATION CLINIC       Allergies:  Allergies  Allergen Reactions  . Adhesive [Tape] Rash    Past Medical History:  Diagnosis Date  . Allergy   . Anemia   . Arthritis   . Asthma   .  Back pain, chronic    GETS INJECTIONS IN BACK  . Blind right eye    hemorrhage  . Blood transfusion   . Cancer (Onset)    breast 1994  . Clotting disorder ([place])   . Diabetes mellitus without complication ([place])    takes precose  . Elevated liver enzymes   . Gallstones   . GERD (gastroesophageal reflux disease)   . HX: breast cancer   . Hyperlipidemia   . Hypertension   . Hypothyroid   . Lymphedema of arm    RT  . Morbid obesity ([place])   . OSA (obstructive sleep apnea)    has not used in 2 years-lost weight  . Osteoporosis   . Peripheral neuropathy    on gabapentin  . PONV (postoperative nausea and vomiting)   . Psoriasis   . Pulmonary embolism () 1998 / 1994 /1968  . Syncope and collapse 2011   due to anemia    Past Surgical History:  Procedure Laterality Date  . ABDOMINAL HYSTERECTOMY    . BONE MARROW TRANSPLANT  1994  . CARDIOVASCULAR STRESS TEST  05/23/2005   EF 53%  . carpal tunnell     bil  . cataracts    . CHOLECYSTECTOMY  05/10/2012   Procedure: LAPAROSCOPIC CHOLECYSTECTOMY WITH INTRAOPERATIVE CHOLANGIOGRAM;  Surgeon: Pedro Earls, MD;  Location: [facility];  Service: General;  Laterality: N/A;  Laparoscopic Cholecystectomy with Intraoperative Cholangiogram  . GASTRIC BYPASS  2011   bariatric surgery  . HIATAL HERNIA REPAIR    . IVC filter     recurrent DVT  . KNEE ARTHROTOMY  1998  . MASS EXCISION Right  11/24/2016   Procedure: EXCISION RIGHT AXILLARY LESION AND CHEST WALL LESION;  Surgeon: Johnathan Hausen, MD;  Location: [facility];  Service: General;  Laterality: Right;  . MASTECTOMY     Right  . US ECHOCARDIOGRAPHY  10/27/08   EF 55-60%    Family History  Problem Relation Age of Onset  . Sudden death Mother        car accident  . Cancer Father 33       lung  . Cancer Sister 57       lung cancer  . Asthma Daughter     Social History:  reports that she quit smoking about 39 years ago. Her smoking use included cigarettes. She has a 5.00 pack-year smoking history. She has never used smokeless tobacco. She reports that she does not drink alcohol or use drugs.  Review of Systems   ANTICOAGULATION: Her Coumadin has been followed regularly in the clinic INR results as follows  Lab Results  Component Value Date   INR 3.2 (A) 05/31/2018   INR 1.68 05/18/2018   INR 2.7 04/23/2018    ABNORMAL liver functions: She has had these before transiently and no etiology found Not clear if this was related to Lipitor which has been stopped  Lab Results  Component Value Date   ALT 26 06/17/2018   ALT 25 01/19/2016     Neuropathy with burning, some numbness and lower leg pain: Also recently has occasional stinging sensations in different parts She gets relief with nortriptyline and gabapentin  MUSCLE cramps/spasms: She is being treated with 10 mg baclofen during the day twice daily and 20 mg at night Recently is doing better with taking an extra baclofen during the day as needed She also uses tonic water, mustard and magnesium supplements   Has not seen neurologist recently for this  MEMORY difficulties: She thinks she is having trouble remembering and sometimes may go to the wrong doctor office for her appointment, asking for additional treatment  Lymphedema right arm, secondary to  breast surgery, using  elastic compression stocking, needs another one    History of  mild hypothyroidism: This has been controlled with 62.5 mcg ,  recent TSH as follows:  Lab Results  Component Value Date   TSH 0.46 06/17/2018    Hypercholesterolemia: LDL has been at target with Lipitor 10 mg but she has now been switched to pravastatin because of relatively higher liver functions previously Liver functions are now consistently normal  Lipid levels:  Lab Results  Component Value Date   CHOL 173 06/17/2018   HDL 90.50 06/17/2018   LDLCALC 72 06/17/2018   TRIG 55.0 06/17/2018   CHOLHDL 2 06/17/2018   Lab Results  Component Value Date   ALT 26 06/17/2018   ALT 25 01/19/2016    Blood pressure controlled with diuretics only  BP Readings from Last 3 Encounters:  06/19/18 110/74  05/18/18 138/70  04/04/18 118/68       LABS:  Lab on 06/17/2018  Component Date Value Ref Range Status  . VITD 06/17/2018 56.59  30.00 - 100.00 ng/mL Final  . Free T4 06/17/2018 1.26  0.60 - 1.60 ng/dL Final   Comment: Specimens from patients who are undergoing biotin therapy and /or ingesting biotin supplements may contain high levels of biotin.  The higher biotin concentration in these specimens interferes with this Free T4 assay.  Specimens that contain high levels  of biotin may cause false high results for this Free T4 assay.  Please interpret results in light of the total clinical presentation of the patient.    Marland Kitchen TSH 06/17/2018 0.46  0.35 - 4.50 uIU/mL Final  . Cholesterol 06/17/2018 173  0 - 200 mg/dL Final   ATP III Classification       Desirable:  < 200 mg/dL               Borderline High:  200 - 239 mg/dL          High:  > = 240 mg/dL  . Triglycerides 06/17/2018 55.0  0.0 - 149.0 mg/dL Final   Normal:  <150 mg/dLBorderline High:  150 - 199 mg/dL  . HDL 06/17/2018 90.50  >39.00 mg/dL Final  . VLDL 06/17/2018 11.0  0.0 - 40.0 mg/dL Final  . LDL Cholesterol 06/17/2018 72  0 - 99 mg/dL Final  . Total CHOL/HDL Ratio 06/17/2018 2   Final                  Men           Women1/2 Average Risk     3.4          3.3Average Risk          5.0          4.42X Average Risk          9.6          7.13X Average Risk          15.0          11.0                      . NonHDL 06/17/2018 82.87   Final   NOTE:  Non-HDL goal should be 30 mg/dL higher than patient's LDL goal (i.e. LDL goal of < 70 mg/dL, would  have non-HDL goal of < 100 mg/dL)  . Sodium 06/17/2018 132* 135 - 145 mEq/L Final  . Potassium 06/17/2018 4.6  3.5 - 5.1 mEq/L Final  . Chloride 06/17/2018 97  96 - 112 mEq/L Final  . CO2 06/17/2018 27  19 - 32 mEq/L Final  . Glucose, Bld 06/17/2018 121* 70 - 99 mg/dL Final  . BUN 06/17/2018 18  6 - 23 mg/dL Final  . Creatinine, Ser 06/17/2018 0.96  0.40 - 1.20 mg/dL Final  . Total Bilirubin 06/17/2018 0.4  0.2 - 1.2 mg/dL Final  . Alkaline Phosphatase 06/17/2018 71  39 - 117 U/L Final  . AST 06/17/2018 23  0 - 37 U/L Final  . ALT 06/17/2018 26  0 - 35 U/L Final  . Total Protein 06/17/2018 7.1  6.0 - 8.3 g/dL Final  . Albumin 06/17/2018 4.0  3.5 - 5.2 g/dL Final  . Calcium 06/17/2018 9.4  8.4 - 10.5 mg/dL Final  . GFR 06/17/2018 73.78  >60.00 mL/min Final  . Hgb A1c MFr Bld 06/17/2018 7.1* 4.6 - 6.5 % Final   Glycemic Control Guidelines for People with Diabetes:Non Diabetic:  <6%Goal of Therapy: <7%Additional Action Suggested:  >8%   Anti-coag visit on 05/31/2018  Component Date Value Ref Range Status  . INR 05/31/2018 3.2* 2.0 - 3.0 Final     EXAM:  BP 110/74   Pulse 74   Ht 5' 9"  (1.753 m)   Wt 229 lb (103.9 kg)   SpO2 95%   BMI 33.82 kg/m   No rash seen on her upper back with the patient has had a recent rash and itching  Left lower leg appears somewhat larger but no pitting edema Minimal swelling of the right foot present Skin appears normal on the lower legs  Assessment/Plan:   1.  EDEMA: Controlled better recently with higher doses of Aldactone However she is taking more than the prescribed dose of Aldactone at 300 mg a day along with 20 mg  Lasix She still does not like to use elastic stocking  Discussed that high doses of Aldactone are not recommended, also may be getting mild hyponatremia from this She will go back to 150 mg as prescribed using 50 mg prescription    2.  Memory difficulties: She should discuss this with her neurologist, will also check B12 on the next visit May be related to vascular causes  3. Mild diabetes: A1c has been consistently higher over 7% Usually A1c is higher than expected She does tend to have a relatively high fasting readings but not after meals  Recommended that the go up to 2 tablets on the metformin at bedtime She will continue Precose before meals also  Reactive hypoglycemia: Symptoms have been less recently  4.   Muscle cramps: Controlled with baclofen and magnesium supplements  7.  Abnormal liver function: Consistently normal now  LIPIDS: Controlled on pravastatin 40 instead of Lipitor and will continue  8.  Mild hypothyroidism: Well-controlled  9.  Low back pain and sciatica: She will be getting epidural steroid tomorrow  10.  Anticoagulation: She will continue follow-up with Coumadin clinic, currently off Coumadin for her steroid injection tomorrow  11.  Vitamin D deficiency: Adequately replaced  Total visit time for evaluation and management of multiple problems and counseling =25 minutes   There are no Patient Instructions on file for this visit.  Elayne Snare 06/19/2018, 9:13 AM

## 2018-06-20 DIAGNOSIS — M5416 Radiculopathy, lumbar region: Secondary | ICD-10-CM | POA: Diagnosis not present

## 2018-06-20 DIAGNOSIS — M5136 Other intervertebral disc degeneration, lumbar region: Secondary | ICD-10-CM | POA: Diagnosis not present

## 2018-07-02 DIAGNOSIS — L600 Ingrowing nail: Secondary | ICD-10-CM | POA: Diagnosis not present

## 2018-07-05 ENCOUNTER — Ambulatory Visit (INDEPENDENT_AMBULATORY_CARE_PROVIDER_SITE_OTHER): Payer: Medicare Other | Admitting: General Practice

## 2018-07-05 DIAGNOSIS — Z7901 Long term (current) use of anticoagulants: Secondary | ICD-10-CM

## 2018-07-05 LAB — POCT INR: INR: 3.6 — AB (ref 2.0–3.0)

## 2018-07-05 NOTE — Patient Instructions (Addendum)
Pre visit review using our clinic review tool, if applicable. No additional management support is needed unless otherwise documented below in the visit note.  Hold coumadin tomorrow and then change dosage and take 2 tablets daily except 1 tablet on Wednesdays.   Re-check in 4  weeks.

## 2018-07-11 DIAGNOSIS — M2012 Hallux valgus (acquired), left foot: Secondary | ICD-10-CM | POA: Diagnosis not present

## 2018-07-11 DIAGNOSIS — T8189XA Other complications of procedures, not elsewhere classified, initial encounter: Secondary | ICD-10-CM | POA: Diagnosis not present

## 2018-07-15 ENCOUNTER — Ambulatory Visit: Payer: Medicare Other | Admitting: Dietician

## 2018-07-19 ENCOUNTER — Encounter (INDEPENDENT_AMBULATORY_CARE_PROVIDER_SITE_OTHER): Payer: Medicare Other | Admitting: Ophthalmology

## 2018-07-19 DIAGNOSIS — H348322 Tributary (branch) retinal vein occlusion, left eye, stable: Secondary | ICD-10-CM

## 2018-07-19 DIAGNOSIS — H353122 Nonexudative age-related macular degeneration, left eye, intermediate dry stage: Secondary | ICD-10-CM

## 2018-07-19 DIAGNOSIS — H33302 Unspecified retinal break, left eye: Secondary | ICD-10-CM

## 2018-07-19 DIAGNOSIS — H43811 Vitreous degeneration, right eye: Secondary | ICD-10-CM

## 2018-07-19 DIAGNOSIS — I10 Essential (primary) hypertension: Secondary | ICD-10-CM

## 2018-07-19 DIAGNOSIS — H353211 Exudative age-related macular degeneration, right eye, with active choroidal neovascularization: Secondary | ICD-10-CM | POA: Diagnosis not present

## 2018-07-19 DIAGNOSIS — H35033 Hypertensive retinopathy, bilateral: Secondary | ICD-10-CM

## 2018-07-20 DIAGNOSIS — M4316 Spondylolisthesis, lumbar region: Secondary | ICD-10-CM | POA: Diagnosis not present

## 2018-07-20 DIAGNOSIS — M545 Low back pain: Secondary | ICD-10-CM | POA: Diagnosis not present

## 2018-07-20 DIAGNOSIS — M5136 Other intervertebral disc degeneration, lumbar region: Secondary | ICD-10-CM | POA: Diagnosis not present

## 2018-07-20 DIAGNOSIS — M5126 Other intervertebral disc displacement, lumbar region: Secondary | ICD-10-CM | POA: Diagnosis not present

## 2018-07-20 DIAGNOSIS — M5416 Radiculopathy, lumbar region: Secondary | ICD-10-CM | POA: Diagnosis not present

## 2018-07-30 DIAGNOSIS — M1712 Unilateral primary osteoarthritis, left knee: Secondary | ICD-10-CM | POA: Diagnosis not present

## 2018-07-30 DIAGNOSIS — M1711 Unilateral primary osteoarthritis, right knee: Secondary | ICD-10-CM | POA: Diagnosis not present

## 2018-08-02 ENCOUNTER — Ambulatory Visit (INDEPENDENT_AMBULATORY_CARE_PROVIDER_SITE_OTHER): Payer: Medicare Other | Admitting: General Practice

## 2018-08-02 ENCOUNTER — Telehealth: Payer: Self-pay | Admitting: Endocrinology

## 2018-08-02 DIAGNOSIS — Z7901 Long term (current) use of anticoagulants: Secondary | ICD-10-CM | POA: Diagnosis not present

## 2018-08-02 LAB — POCT INR: INR: 1.5 — AB (ref 2.0–3.0)

## 2018-08-02 NOTE — Telephone Encounter (Signed)
Please advise 

## 2018-08-02 NOTE — Telephone Encounter (Signed)
Patient states she had been taking antibiotics from the foot doctor and has now had a burning a itching like a yeast infection. Please Advise on medication fill. Thanks

## 2018-08-02 NOTE — Patient Instructions (Addendum)
Pre visit review using our clinic review tool, if applicable. No additional management support is needed unless otherwise documented below in the visit note.  Take 3 tablets today and tomorrow (1/3 and 1/4) and then continue to take 2 tablets daily except 1 tablet on Wednesdays.   Re-check in 2  weeks.

## 2018-08-03 ENCOUNTER — Other Ambulatory Visit: Payer: Self-pay | Admitting: Endocrinology

## 2018-08-03 MED ORDER — FLUCONAZOLE 150 MG PO TABS: 150.0000 mg | ORAL_TABLET | Freq: Once | ORAL | 0 refills | Status: AC

## 2018-08-03 NOTE — Telephone Encounter (Signed)
Diflucan has been ordered

## 2018-08-16 ENCOUNTER — Other Ambulatory Visit (INDEPENDENT_AMBULATORY_CARE_PROVIDER_SITE_OTHER): Payer: Medicare Other

## 2018-08-16 ENCOUNTER — Ambulatory Visit (INDEPENDENT_AMBULATORY_CARE_PROVIDER_SITE_OTHER): Payer: Medicare Other | Admitting: General Practice

## 2018-08-16 DIAGNOSIS — Z7901 Long term (current) use of anticoagulants: Secondary | ICD-10-CM | POA: Diagnosis not present

## 2018-08-16 DIAGNOSIS — E1142 Type 2 diabetes mellitus with diabetic polyneuropathy: Secondary | ICD-10-CM | POA: Diagnosis not present

## 2018-08-16 LAB — COMPREHENSIVE METABOLIC PANEL
ALT: 23 U/L (ref 0–35)
AST: 23 U/L (ref 0–37)
Albumin: 4.1 g/dL (ref 3.5–5.2)
Alkaline Phosphatase: 72 U/L (ref 39–117)
BILIRUBIN TOTAL: 0.3 mg/dL (ref 0.2–1.2)
BUN: 22 mg/dL (ref 6–23)
CO2: 30 meq/L (ref 19–32)
CREATININE: 1.05 mg/dL (ref 0.40–1.20)
Calcium: 10.1 mg/dL (ref 8.4–10.5)
Chloride: 100 mEq/L (ref 96–112)
GFR: 62.57 mL/min (ref >60.00)
GLUCOSE: 104 mg/dL — AB (ref 70–99)
Potassium: 4.4 mEq/L (ref 3.5–5.1)
SODIUM: 138 meq/L (ref 135–145)
Total Protein: 7.5 g/dL (ref 6.0–8.3)

## 2018-08-16 LAB — HEMOGLOBIN A1C: Hgb A1c MFr Bld: 6.5 % (ref 4.6–6.5)

## 2018-08-16 LAB — POCT INR: INR: 3.1 — AB (ref 2.0–3.0)

## 2018-08-16 LAB — TSH: TSH: 0.77 u[IU]/mL (ref 0.35–4.50)

## 2018-08-16 NOTE — Patient Instructions (Addendum)
Pre visit review using our clinic review tool, if applicable. No additional management support is needed unless otherwise documented below in the visit note.  Take 1 tablets tomorrow (1/18) and then continue to take 2 tablets daily except 1 tablet on Wednesdays.   Re-check in 4 weeks.

## 2018-08-21 ENCOUNTER — Encounter: Payer: Self-pay | Admitting: Endocrinology

## 2018-08-21 ENCOUNTER — Ambulatory Visit (INDEPENDENT_AMBULATORY_CARE_PROVIDER_SITE_OTHER): Payer: Medicare Other | Admitting: Endocrinology

## 2018-08-21 VITALS — BP 110/72 | HR 98 | Ht 69.0 in | Wt 237.8 lb

## 2018-08-21 DIAGNOSIS — E063 Autoimmune thyroiditis: Secondary | ICD-10-CM

## 2018-08-21 DIAGNOSIS — I872 Venous insufficiency (chronic) (peripheral): Secondary | ICD-10-CM

## 2018-08-21 DIAGNOSIS — E1142 Type 2 diabetes mellitus with diabetic polyneuropathy: Secondary | ICD-10-CM

## 2018-08-21 DIAGNOSIS — K912 Postsurgical malabsorption, not elsewhere classified: Secondary | ICD-10-CM

## 2018-08-21 DIAGNOSIS — R252 Cramp and spasm: Secondary | ICD-10-CM

## 2018-08-21 NOTE — Progress Notes (Signed)
Patient ID: Kelli Martinez, female   DOB: 04-09-1946, 73 y.o.   MRN: [id number]    Chief complaint: Followup of various issues   History of Present Illness:  The patient is seen for the following problems:  1.  Postprandial hypoglycemia related to dumping syndrome   She has had postprandial hypoglycemia starting a couple of years after her gastric bypass surgery Her symptoms of blood sugars are mostly a feeling of significant weakness, some shakiness.  She will use glucose tablets or juice in order to relieve the symptoms, recently she thinks that 1 or 2 glucose tablets are usually adequate She has been to the dietitian and has been instructed on balanced low-fat meals with enough protein consistently and restricting carbohydrates including fruits   Recent history:  She has been treated with acarbose with benefit  She did try verapamil empirically but this did not help.  She is taking her Precose when she starts eating, 1 tablet at breakfast 2 at lunch and 1 at dinner  She thinks her blood sugars have not been low and only 1x in 60s recently   She does try to have some protein with each meal Recently trying to eat more low glycemic foods  2.  SWELLING of the legs:  She has had significant problems with her legs swelling more recently She was told to take 200 mg of Aldactone daily but she is taking  200 mg 1 day and 100 the next   Also will take 20 mg of Lasix at the same time She is not taking larger doses of Lasix usually because of cramps  Renal function and potassium are stable with this  Lab Results  Component Value Date   K 4.4 08/16/2018     PROBLEM 3:  DIABETES:  This has been mild and well controlled since her gastric bypass surgery  The A1c has been 6.8-6.9 in the past, now improved at 6.5  Current management, blood sugar patterns:  She did not bring her blood sugar monitor  She thinks that she is cutting back on white bread and pasta  and also overall carbohydrates and sugar  Although she has not lost weight she thinks her blood sugars have been close to 100 in the morning but not below  Still not able to exercise because of back pain and sciatica Lab fasting glucose was 104, previously 121  She is taking METFORMIN 500 mg at bedtime that was started when her A1c was higher Also taking Precose at each meal also to prevent reactive hypoglycemia    Wt Readings from Last 3 Encounters:  08/21/18 237 lb 12.8 oz (107.9 kg)  06/19/18 229 lb (103.9 kg)  04/04/18 232 lb 6.4 oz (105.4 kg)    Lab Results  Component Value Date   HGBA1C 6.5 08/16/2018   HGBA1C 7.1 (H) 06/17/2018   HGBA1C 7.2 (H) 01/15/2018   Lab Results  Component Value Date   MICROALBUR <0.7 01/15/2018   LDLCALC 72 06/17/2018   CREATININE 1.05 08/16/2018      Other active problems: see review of systems      Allergies as of 08/21/2018      Reactions   Adhesive [tape] Rash      Medication List       Accurate as of August 21, 2018  2:06 PM. Always use your most recent med list.        acarbose 25 MG tablet Commonly known as:  PRECOSE 1 tablet at the  start of breakfast and dinner and 2 tablets at lunch   albuterol 108 (90 Base) MCG/ACT inhaler Commonly known as:  PROAIR HFA USE 2 INHALATIONS FOUR TIMES A DAY AS NEEDED ( AS NEEDED RESCUE INHALER)   baclofen 20 MG tablet Commonly known as:  LIORESAL Take 1 tablet (20 mg total) by mouth daily. TAKE 1 TABLET 3 TIMES A DAY   baclofen 10 MG tablet Commonly known as:  LIORESAL Take 1 tablet twice a day   CALTRATE 600+D 600-400 MG-UNIT tablet Generic drug:  Calcium Carbonate-Vitamin D Take 1 tablet by mouth daily.   clobetasol cream 0.05 % Commonly known as:  TEMOVATE Apply 1 application topically 2 (two) times daily.   diazepam 10 MG Gel Commonly known as:  DIASTAT ACUDIAL Place 5 mg rectally once as needed (cramping).   ferrous sulfate 325 (65 FE) MG tablet Take 325 mg by  mouth daily.   fluticasone 50 MCG/ACT nasal spray Commonly known as:  FLONASE Place 2 sprays into both nostrils daily.   Fluticasone-Salmeterol 100-50 MCG/DOSE Aepb Commonly known as:  ADVAIR DISKUS USE 1 INHALATION EVERY 12 HOURS, RINSE MOUTH   freestyle lancets USE AS INSTRUCTED TO CHECK BLOOD SUGAR ONCE DAILY   FREESTYLE LITE Devi Use to check blood sugar once a day DX code E11.65   FREESTYLE FREEDOM LITE w/Device Kit use as directed   FREESTYLE LITE test strip Generic drug:  glucose blood USE AS INSTRUCTED TO CHECK BLOOD SUGAR ONCE DAILY   furosemide 40 MG tablet Commonly known as:  LASIX TAKE 1 TABLET EVERY MORNING   gabapentin 300 MG capsule Commonly known as:  NEURONTIN TAKE 2 CAPSULES THREE TIMES A DAY   HYDROcodone-acetaminophen 5-325 MG tablet Commonly known as:  NORCO Take 1 tablet by mouth 2 (two) times daily as needed for moderate pain.   levothyroxine 125 MCG tablet Commonly known as:  SYNTHROID, LEVOTHROID TAKE 1 TABLET DAILY BEFORE BREAKFAST   metFORMIN 500 MG tablet Commonly known as:  GLUCOPHAGE TAKE 1 TABLET AT BEDTIME   methylcellulose 1 % ophthalmic solution Commonly known as:  ARTIFICIAL TEARS Place 1 drop into both eyes as needed. Dry eyes   omeprazole 20 MG capsule Commonly known as:  PRILOSEC TAKE 1 CAPSULE DAILY   pravastatin 40 MG tablet Commonly known as:  PRAVACHOL Take 1 tablet (40 mg total) by mouth daily.   SLOW-MAG PO Take 500 mg by mouth.   spironolactone 50 MG tablet Commonly known as:  ALDACTONE 2 tablets in a.m., 1 tablet p.m.   temazepam 15 MG capsule Commonly known as:  RESTORIL 1-2 for sleep as needed   vitamin B-12 500 MCG tablet Commonly known as:  CYANOCOBALAMIN Take 500 mcg by mouth daily.   Vitamin D (Ergocalciferol) 1.25 MG (50000 UT) Caps capsule Commonly known as:  DRISDOL TAKE 1 CAPSULE EVERY FRIDAY   warfarin 5 MG tablet Commonly known as:  COUMADIN Take as directed by the anticoagulation  clinic. If you are unsure how to take this medication, talk to your nurse or doctor. Original instructions:  TAKE 2 TABLETS DAILY EXCEPT TAKE 3 TABLETS ON MONDAYS ONLY OR AS DIRECTED BY ANTICOAGULATION CLINIC       Allergies:  Allergies  Allergen Reactions  . Adhesive [Tape] Rash    Past Medical History:  Diagnosis Date  . Allergy   . Anemia   . Arthritis   . Asthma   . Back pain, chronic    GETS INJECTIONS IN BACK  . Blind right eye  hemorrhage  . Blood transfusion   . Cancer ([place])    breast 1994  . Clotting disorder ([place])   . Diabetes mellitus without complication ([place])    takes precose  . Elevated liver enzymes   . Gallstones   . GERD (gastroesophageal reflux disease)   . HX: breast cancer   . Hyperlipidemia   . Hypertension   . Hypothyroid   . Lymphedema of arm    RT  . Morbid obesity ([place])   . OSA (obstructive sleep apnea)    has not used in 2 years-lost weight  . Osteoporosis   . Peripheral neuropathy    on gabapentin  . PONV (postoperative nausea and vomiting)   . Psoriasis   . Pulmonary embolism ([place]) 1998 / 1994 /1968  . Syncope and collapse 2011   due to anemia    Past Surgical History:  Procedure Laterality Date  . ABDOMINAL HYSTERECTOMY    . BONE MARROW TRANSPLANT  1994  . CARDIOVASCULAR STRESS TEST  05/23/2005   EF 53%  . carpal tunnell     bil  . cataracts    . CHOLECYSTECTOMY  05/10/2012   Procedure: LAPAROSCOPIC CHOLECYSTECTOMY WITH INTRAOPERATIVE CHOLANGIOGRAM;  Surgeon: Pedro Earls, MD;  Location: [facility];  Service: General;  Laterality: N/A;  Laparoscopic Cholecystectomy with Intraoperative Cholangiogram  . GASTRIC BYPASS  2011   bariatric surgery  . HIATAL HERNIA REPAIR    . IVC filter     recurrent DVT  . KNEE ARTHROTOMY  1998  . MASS EXCISION Right 11/24/2016   Procedure: EXCISION RIGHT AXILLARY LESION AND CHEST WALL LESION;  Surgeon: Johnathan Hausen, MD;  Location: House;  Service: General;  Laterality:  Right;  . MASTECTOMY     Right  . US ECHOCARDIOGRAPHY  10/27/08   EF 55-60%    Family History  Problem Relation Age of Onset  . Sudden death Mother        car accident  . Cancer Father 3       lung  . Cancer Sister 24       lung cancer  . Asthma Daughter     Social History:  reports that she quit smoking about 40 years ago. Her smoking use included cigarettes. She has a 5.00 pack-year smoking history. She has never used smokeless tobacco. She reports that she does not drink alcohol or use drugs.  Review of Systems   ANTICOAGULATION: Her Coumadin has been followed regularly in the clinic INR results as follows  Lab Results  Component Value Date   INR 3.1 (A) 08/16/2018   INR 1.5 (A) 08/02/2018   INR 3.6 (A) 07/05/2018    ABNORMAL liver functions: She has had these before transiently and no etiology found Not clear if this was related to Lipitor that she had taken before  Lab Results  Component Value Date   ALT 23 08/16/2018   ALT 25 01/19/2016     Neuropathy with burning, some numbness and lower leg pain: Also recently has occasional stinging sensations in different parts She gets some relief with nortriptyline and gabapentin  MUSCLE cramps/spasms: She is being treated with 10 mg baclofen during the day twice daily and 20 mg at night She still has some symptoms but she thinks it is better with drinking water She also uses tonic water, mustard and magnesium supplements     Lymphedema right arm, secondary to  breast surgery, using  elastic compression stocking with relief   History of  mild hypothyroidism: This has been controlled with 62.5 mcg ,  recent TSH as follows:  Lab Results  Component Value Date   TSH 0.77 08/16/2018    Hypercholesterolemia: LDL has been at target with pravastatin Liver functions are consistently normal  Lipid levels:  Lab Results  Component Value Date   CHOL 173 06/17/2018   HDL 90.50 06/17/2018   LDLCALC 72 06/17/2018    TRIG 55.0 06/17/2018   CHOLHDL 2 06/17/2018   Lab Results  Component Value Date   ALT 23 08/16/2018   ALT 25 01/19/2016    Blood pressure controlled with diuretics only No orthostatic drop in blood pressure today  BP Readings from Last 3 Encounters:  08/21/18 110/72  06/19/18 110/74  05/18/18 138/70   She is having significant amount of constipation going every 3 days or so and will take Dulcolax as needed  She was having some vaginal discharge but now after taking Diflucan she is having mostly itching in the perineal area and is using steroid creams with only transient relief  Asking about occasional rash or pimples on the skin of face   LABS:  Lab on 08/16/2018  Component Date Value Ref Range Status  . TSH 08/16/2018 0.77  0.35 - 4.50 uIU/mL Final  . Sodium 08/16/2018 138  135 - 145 mEq/L Final  . Potassium 08/16/2018 4.4  3.5 - 5.1 mEq/L Final  . Chloride 08/16/2018 100  96 - 112 mEq/L Final  . CO2 08/16/2018 30  19 - 32 mEq/L Final  . Glucose, Bld 08/16/2018 104* 70 - 99 mg/dL Final  . BUN 08/16/2018 22  6 - 23 mg/dL Final  . Creatinine, Ser 08/16/2018 1.05  0.40 - 1.20 mg/dL Final  . Total Bilirubin 08/16/2018 0.3  0.2 - 1.2 mg/dL Final  . Alkaline Phosphatase 08/16/2018 72  39 - 117 U/L Final  . AST 08/16/2018 23  0 - 37 U/L Final  . ALT 08/16/2018 23  0 - 35 U/L Final  . Total Protein 08/16/2018 7.5  6.0 - 8.3 g/dL Final  . Albumin 08/16/2018 4.1  3.5 - 5.2 g/dL Final  . Calcium 08/16/2018 10.1  8.4 - 10.5 mg/dL Final  . GFR 08/16/2018 62.57  >60.00 mL/min Final  . Hgb A1c MFr Bld 08/16/2018 6.5  4.6 - 6.5 % Final   Glycemic Control Guidelines for People with Diabetes:Non Diabetic:  <6%Goal of Therapy: <7%Additional Action Suggested:  >8%   Anti-coag visit on 08/16/2018  Component Date Value Ref Range Status  . INR 08/16/2018 3.1* 2.0 - 3.0 Final  Anti-coag visit on 08/02/2018  Component Date Value Ref Range Status  . INR 08/02/2018 1.5* 2.0 - 3.0 Final      EXAM:  BP 110/72 (BP Location: Left Arm, Patient Position: Sitting, Cuff Size: Large)   Pulse 98   Ht _0  (1.753 m)   Wt 237 lb 12.8 oz (107.9 kg)   SpO2 98%   BMI 35.12 kg/m    She appears to have significant edema especially the right leg No signs of cellulitis She has couple of small areas of folliculitis on the hands and face   Assessment/Plan:   1.  EDEMA: This is worse and not clear why She does try to force a lot of water intake also She can try taking a temporary dose of 40 mg Lasix for now Also needs to take 200 mg of Aldactone daily, currently taking an average of only 150 Advised her to cut back on large  amounts of water intake   2.  Weight gain: Likely to be from edema recently  3. Mild diabetes: A1c has been consistently higher over 7% She has not brought her monitor but she thinks that her blood sugars are better with improving her diet and carbohydrate restriction Fasting glucose was also better in the lab  She will continue Precose before meals as before  Reactive hypoglycemia: Symptoms have been controlled especially with her trying to reduce high glycemic index foods and get some protein  4.   Muscle cramps: Generally better with baclofen and magnesium supplements Continue follow-up with neurologist as needed and also for neuropathy  7.  Abnormal liver function: Consistently normal now  LIPIDS: Controlled on pravastatin 40 and will follow-up periodically e  8.  Mild hypothyroidism: Well-controlled with similar doses for some time and TSH is normal again  9.  Low back pain and sciatica: Continue follow-up with orthopedic surgeon  10.  Anticoagulation: She will continue follow-up with Coumadin clinic, her INR was low last month because of eating a lot of green vegetables and salads  11.  Constipation: She will take MiraLAX daily  She is also using Monistat cream for perineal itching  Given her list of PCPs to establish with for general care  and wellness visits   Total visit time for evaluation and management of multiple problems and counseling =25 minutes   Patient Instructions  Lasix 16m daily for 4-5 days then 1 alt with 2  Spironolactone 2074mdaily  Monistat cream daily  Miralax daily   AjElayne Snare/22/2020, 2:06 PM

## 2018-08-21 NOTE — Patient Instructions (Addendum)
Lasix 40mg  daily for 4-5 days then 1 alt with 2  Spironolactone 200mg  daily  Monistat cream daily  Miralax daily

## 2018-08-28 ENCOUNTER — Other Ambulatory Visit: Payer: Self-pay | Admitting: Endocrinology

## 2018-09-04 ENCOUNTER — Ambulatory Visit: Payer: Medicare Other | Admitting: Internal Medicine

## 2018-09-04 ENCOUNTER — Encounter: Payer: Self-pay | Admitting: Internal Medicine

## 2018-09-04 ENCOUNTER — Ambulatory Visit (INDEPENDENT_AMBULATORY_CARE_PROVIDER_SITE_OTHER): Payer: Medicare Other | Admitting: Internal Medicine

## 2018-09-04 VITALS — BP 120/80 | HR 74 | Ht 69.0 in | Wt 233.0 lb

## 2018-09-04 DIAGNOSIS — J3089 Other allergic rhinitis: Secondary | ICD-10-CM | POA: Diagnosis not present

## 2018-09-04 DIAGNOSIS — J4530 Mild persistent asthma, uncomplicated: Secondary | ICD-10-CM | POA: Diagnosis not present

## 2018-09-04 DIAGNOSIS — J302 Other seasonal allergic rhinitis: Secondary | ICD-10-CM | POA: Diagnosis not present

## 2018-09-04 DIAGNOSIS — G4733 Obstructive sleep apnea (adult) (pediatric): Secondary | ICD-10-CM

## 2018-09-04 MED ORDER — FLUTICASONE PROPIONATE 50 MCG/ACT NA SUSP: 2.0000 | Freq: Every day | NASAL | 3 refills | Status: DC

## 2018-09-04 MED ORDER — FLUTICASONE-SALMETEROL 100-50 MCG/DOSE IN AEPB: INHALATION_SPRAY | RESPIRATORY_TRACT | 3 refills | Status: DC

## 2018-09-04 MED ORDER — FLUTICASONE-UMECLIDIN-VILANT 100-62.5-25 MCG/INH IN AEPB: 1.0000 | INHALATION_SPRAY | Freq: Every day | RESPIRATORY_TRACT | 0 refills | Status: AC

## 2018-09-04 MED ORDER — ALBUTEROL SULFATE HFA 108 (90 BASE) MCG/ACT IN AERS: INHALATION_SPRAY | RESPIRATORY_TRACT | 3 refills | Status: DC

## 2018-09-04 NOTE — Progress Notes (Signed)
Subjective:    Patient ID: Kelli Martinez, female    DOB: Jun 23, 1946, 73 y.o.   MRN: [id number]  HPI F former smoker followed for OSA/ quit CPAP, allergic rhinitis,asthma,  hx pulm embolism/DVT/filter/coumadin(Dr Dwyane Dee), hx R breast Ca/ mastectomy/ xrt/chem/bonemarrow transplant at [facility], hx gastric bypass, DM 2 Office Spirometry 04/27/2015-WNL-FEV1/FVC 0.81, FEV1 2.50/110%  ----------------------------------------------------------------------------------------  08/31/17- 73 yoF former smoker followed for OSA/ quit CPAP, insomnia, allergic rhinitis,asthma,  hx pulm embolism/DVT/filter/coumadin(Dr Dwyane Dee), hx R breast Ca/ mastectomy/ xrt/chem/bonemarrow transplant at [facility], hx gastric bypass ----yearly ROV, sinus congestion Needs meds refilled.  Uses rescue inhaler every few days with little sleep disturbance. Woke 2 days ago with sore throat, progressive head congestion, bilateral maxillary pain and pressure, active postnasal drip.  No fever or adenopathy, cough or wheeze. CXR 08/28/17 IMPRESSION: No active cardiopulmonary disease.  09/04/2018- 73 yoF former smoker followed for OSA/ quit CPAP, insomnia, allergic rhinitis,asthma,  hx pulm embolism/DVT/filter/coumadin(Dr Dwyane Dee), hx R breast Ca/ mastectomy/ xrt/chem/bonemarrow transplant at [facility], hx gastric bypass, DM 2 -----nasal congestion - prod cough (clear)  Pro-air HFA, Flonase, Advair 100, In recent weeks has had itching of eyes and nose, sneezing, cough with scant clear mucus.  Using Flonase.  Uses rescue inhaler at least a couple of times per week.  Does wheeze sometimes at night.  She agreed to try Trelegy but is reluctant to leave her familiar Advair. Insomnia with difficulty initiating and maintaining sleep.  Has awakened choking/snoring and agrees to update with home sleep test.  I do not find report of remote sleep study in our EMR.  Review of Systems- see HPI + = positive Constitutional:   No-   weight loss, night sweats, fevers,  chills, fatigue, lassitude. HEENT:   No-  headaches, difficulty swallowing, tooth/dental problems, sore throat,   +dry mouth      No-  sneezing, itching, ear ache,  +nasal congestion, +post nasal drip, +blind R eye CV:  No-   chest pain, orthopnea, PND, swelling in lower extremities, anasarca, dizziness, palpitations Resp: No-   shortness of breath with exertion or at rest.              No-   productive cough,   non-productive cough,  No-  coughing up of blood.              No-   change in color of mucus.   Skin: No-   rash or lesions. GI:  No-   heartburn, indigestion, abdominal pain, nausea, vomiting,  GU:  MS:  No-   joint pain,    swelling.   Neuro- : +HPI Psych:  No- change in mood or affect. No depression or anxiety.  No memory loss.   Objective:   Physical Exam General- Alert, Oriented, Affect-appropriate, Distress- none acute  + obese Skin- rash-none, lesions- none, excoriation- none. XRT skin changes right lateral neck Lymphadenopathy- none Head- atraumatic            Eyes- +blind R eye, clear conjunctivae            Ears- Hearing, canals, TMs normal            Nose- Clear, no- Septal dev, + sticky mucus, polyps, erosion, perforation             Throat- Mallampati III , mucosa clear , drainage- none, tonsils- atrophic, missing teeth Neck- flexible , trachea midline, no stridor , thyroid nl, carotid no bruit Chest - symmetrical excursion , unlabored  Heart/CV- RRR , no murmur , no gallop  , no rub, nl s1 s2                           - JVD- none , edema+L>R, stasis changes- none, varices- none           Lung- clear to P&A, wheeze+, cough- none , dullness-none, rub- none           Chest wall- + right mastectomy Abd-  Br/ Gen/ Rectal- Not done, not indicated Extrem- cyanosis- none, clubbing, none, atrophy- none, strength- nl   Heavy legs. +Lymphedema right forearm/ elastic sleeve Neuro- grossly intact to observation  Assessment & Plan:

## 2018-09-04 NOTE — Assessment & Plan Note (Signed)
Remote diagnosis.  She had failed CPAP years ago.  She asks if she needs to be back on it. Plan-schedule home sleep test

## 2018-09-04 NOTE — Patient Instructions (Addendum)
Scripts sent refilling Proair, Flonase, Advair  Sample Trelegy inhaler   Inhale 1 puff, once daily, then rinse mouth Try this sample for a week instead of Advair. If you really like it, let us know and we can order it. Otherwise, stay with Advair.  Ok to add an otc antihistamine like Claritin/ loratadine if needed for allergy.  Order Home sleep test   Dx OSA Please call for results of the sleep test. If appropriate, we may be able to restart CPAP.  Please cal if we can help

## 2018-09-04 NOTE — Assessment & Plan Note (Signed)
Mild wheeze today.  She needs to be on a maintenance controller.  Trelegy might be a good choice for her-she agrees to try a sample but indicates she would like to stay with familiar Advair. Plan-Meds refilled, sample Trelegy with discussion

## 2018-09-04 NOTE — Assessment & Plan Note (Signed)
Current symptoms are suggestive of allergic rhinitis although it is still midwinter.  She is using Flonase.  We discussed viral versus allergic. Plan-continue Flonase, add Claritin if needed

## 2018-09-13 ENCOUNTER — Ambulatory Visit (INDEPENDENT_AMBULATORY_CARE_PROVIDER_SITE_OTHER): Payer: Medicare Other | Admitting: General Practice

## 2018-09-13 DIAGNOSIS — Z7901 Long term (current) use of anticoagulants: Secondary | ICD-10-CM | POA: Diagnosis not present

## 2018-09-13 LAB — POCT INR: INR: 1.9 — AB (ref 2.0–3.0)

## 2018-09-13 NOTE — Patient Instructions (Addendum)
Pre visit review using our clinic review tool, if applicable. No additional management support is needed unless otherwise documented below in the visit note.  Take 3 tablets (15 mg) today and then continue to take 2 tablets daily except 1 tablet on Wednesdays.   Re-check in 4 weeks.

## 2018-09-17 ENCOUNTER — Telehealth: Payer: Self-pay | Admitting: Internal Medicine

## 2018-09-17 NOTE — Telephone Encounter (Signed)
When I called Kelli Martinez to schedule the home sleep test she stated she was getting ready to call our office. She stated that she had started coughing yesterday and it is a dry cough. She states that she has no runny nose, no fever but the cough is mostly at night. She also stated that when she coughs there is a pulling on the right side of her chest. Would someone call to triage this message sorry I was just trying to help. Dr. Janee Morn patient.

## 2018-09-17 NOTE — Telephone Encounter (Signed)
lmom x1 

## 2018-09-20 ENCOUNTER — Other Ambulatory Visit: Payer: Self-pay | Admitting: Endocrinology

## 2018-10-02 ENCOUNTER — Encounter: Payer: Self-pay | Admitting: Family Medicine

## 2018-10-02 ENCOUNTER — Ambulatory Visit (INDEPENDENT_AMBULATORY_CARE_PROVIDER_SITE_OTHER): Payer: Medicare Other | Admitting: Family Medicine

## 2018-10-02 VITALS — BP 104/60 | Temp 99.0°F | Ht 69.0 in | Wt 233.0 lb

## 2018-10-02 DIAGNOSIS — M1990 Unspecified osteoarthritis, unspecified site: Secondary | ICD-10-CM

## 2018-10-02 DIAGNOSIS — E039 Hypothyroidism, unspecified: Secondary | ICD-10-CM

## 2018-10-02 DIAGNOSIS — I10 Essential (primary) hypertension: Secondary | ICD-10-CM

## 2018-10-02 DIAGNOSIS — R600 Localized edema: Secondary | ICD-10-CM

## 2018-10-02 DIAGNOSIS — Z9884 Bariatric surgery status: Secondary | ICD-10-CM

## 2018-10-02 DIAGNOSIS — I890 Lymphedema, not elsewhere classified: Secondary | ICD-10-CM

## 2018-10-02 DIAGNOSIS — Z853 Personal history of malignant neoplasm of breast: Secondary | ICD-10-CM | POA: Diagnosis not present

## 2018-10-02 DIAGNOSIS — G629 Polyneuropathy, unspecified: Secondary | ICD-10-CM

## 2018-10-02 DIAGNOSIS — J4530 Mild persistent asthma, uncomplicated: Secondary | ICD-10-CM

## 2018-10-02 NOTE — Progress Notes (Signed)
Patient presents to clinic today to establish care.  SUBJECTIVE: PMH:  Pt is a 73 yo female with pmh sig for h/o DVTs/PE on coumadin, h/o R breast cancer, HTN, seasonal allergies, asthma, OSA, h/o gastric bypass, DM II, lymphedema, LE edema, neuropathy.  Pt was being followed by Dr. Dwyane Dee for Endocinology and Primary care.  Pt is also followed by numerous specialist.   HTN: -Currently taking Lasix 40 mg, spironolactone 50 mg -States BP is always low. Hypothyroidism: -Taking Synthroid 125 mcg daily -Followed by Dr. Dwyane Dee, endocrinology -Patient denies current symptoms.  History of breast cancer: -Status post right mastectomy and lymph node removal. -Patient states 10 of 13 lymph nodes removed had cancer -As a result patient has lymphedema in right arm. -Patient also underwent [facility] -Due to transplant patient takes iron daily.  At times iron becomes so low she has to receive infusions -States was released from oncology.  States told less than 3% chance of recurrence  Lymphedema: -2/2 right mastectomy and lymph node removal -Patient requesting Rx for compression sleeve for right arm  History of gastric bypass: -Had procedure in 2011 -Patient was 343 pounds at her heaviest -Taking multivitamins  Asthma: -Endorses using Advair and albuterol -Followed by Dr. Annamaria Boots -Feels like using albuterol inhaler more recently  Allergies: -Patient taking Flonase -Unsure if medication is helping her allergies -Endorses nasal drainage/cough  Lower extremity edema: -Patient tries to elevate her feet when she is sitting -Taking Lasix 40 mg daily -Not eating extra salt -Recently purchased a new recliner -At times has difficulty wrapping her legs. -Was using TED hose above the knee, but had difficulty getting them off. -Patient request new Rx for TED hose -Endorses pain in bilateral legs 2/2 swelling -Patient denies erythema, calf pain.  History of  DVTs/PE -States had first DVT after her daughter was born, ~51 yrs ago -Had recurrent clots -Currently taking Coumadin 5 mg on Wednesdays and 10 mg on all other days. -Patient denies active bleeding -Patient endorses fall several months ago, edema and ecchymosis in leg resolving.  Diabetes type 2: -Patient states she had issues with her blood sugar prior to gastric bypass -Since procedure blood sugar has been creeping up -Currently taking metformin Mole removal: -Patient states she recently had a concerning mole removed from her right foot by podiatry: -Patient states she has follow-up tomorrow regarding this -Currently wearing a walking shoe.  OSA: -Improved after gastric bypass -Followed by Dr. Annamaria Boots, pulmonology  Neuropathy: -Followed by Dr. Herma Mering -Notes feeling numbness/tingling in feet, legs, hands. -Taking gabapentin  Muscle spasms: -Taking baclofen 10 mg every morning and p.m. -Taking baclofen 20 mg nightly. -states she gets 10 mg baclofen tabs from [facility] and 20 mg from [facility]. -Pt also has a rx for baclofen injection? to be used for unrelieved muscle spasm.  Chronic pain: -Followed by Dr. Dossie Der for pain management -endorses pinched nerve in back, DDD, bulging disc, arthritis in knees. -states she needs surgery however is unable to have it done 2/2 her Coumadin use and history of blood clots.  Allergies: Adhesive tape-rash  Past Medical History:  Diagnosis Date  . Allergy   . Anemia   . Arthritis   . Asthma   . Back pain, chronic    GETS INJECTIONS IN BACK  . Blind right eye    hemorrhage  . Blood transfusion   . Cancer ([place])    breast 1994  . Clotting disorder ([place])   . Diabetes mellitus  without complication ([place])    takes precose  . Elevated liver enzymes   . Gallstones   . GERD (gastroesophageal reflux disease)   . HX: breast cancer   . Hyperlipidemia   . Hypertension   . Hypothyroid   . Lymphedema of arm    RT  . Morbid obesity  ([place])   . OSA (obstructive sleep apnea)    has not used in 2 years-lost weight  . Osteoporosis   . Peripheral neuropathy    on gabapentin  . PONV (postoperative nausea and vomiting)   . Psoriasis   . Pulmonary embolism ([place]) 1998 / 1994 /1968  . Syncope and collapse 2011   due to anemia    Past Surgical History:  Procedure Laterality Date  . ABDOMINAL HYSTERECTOMY    . BONE MARROW TRANSPLANT  1994  . CARDIOVASCULAR STRESS TEST  05/23/2005   EF 53%  . carpal tunnell     bil  . cataracts    . CHOLECYSTECTOMY  05/10/2012   Procedure: LAPAROSCOPIC CHOLECYSTECTOMY WITH INTRAOPERATIVE CHOLANGIOGRAM;  Surgeon: Pedro Earls, MD;  Location: [facility];  Service: General;  Laterality: N/A;  Laparoscopic Cholecystectomy with Intraoperative Cholangiogram  . GASTRIC BYPASS  2011   bariatric surgery  . HIATAL HERNIA REPAIR    . IVC filter     recurrent DVT  . KNEE ARTHROTOMY  1998  . MASS EXCISION Right 11/24/2016   Procedure: EXCISION RIGHT AXILLARY LESION AND CHEST WALL LESION;  Surgeon: Johnathan Hausen, MD;  Location: [place];  Service: General;  Laterality: Right;  . MASTECTOMY     Right  . US ECHOCARDIOGRAPHY  10/27/08   EF 55-60%    Current Outpatient Medications on File Prior to Visit  Medication Sig Dispense Refill  . acarbose (PRECOSE) 25 MG tablet TAKE 1 TABLET AT THE START OF BREAKFAST AND DINNER AND 2 TABLETS AT LUNCH 360 tablet 4  . albuterol (PROAIR HFA) 108 (90 Base) MCG/ACT inhaler USE 2 INHALATIONS FOUR TIMES A DAY AS NEEDED ( AS NEEDED RESCUE INHALER) 54 g 3  . baclofen (LIORESAL) 10 MG tablet TAKE 1 TABLET TWICE A DAY 180 tablet 4  . baclofen (LIORESAL) 20 MG tablet Take 1 tablet (20 mg total) by mouth daily. TAKE 1 TABLET 3 TIMES A DAY (Patient taking differently: Take 20 mg by mouth daily. ) 90 tablet 1  . Blood Glucose Monitoring Suppl (FREESTYLE FREEDOM LITE) w/Device KIT use as directed 1 each 0  . Blood Glucose Monitoring Suppl (FREESTYLE LITE)  DEVI Use to check blood sugar once a day DX code E11.65 1 each 0  . Calcium Carbonate-Vitamin D (CALTRATE 600+D) 600-400 MG-UNIT tablet Take 1 tablet by mouth daily.    . clobetasol cream (TEMOVATE) 3.29 % Apply 1 application topically 2 (two) times daily. 120 g 1  . diazepam (DIASTAT ACUDIAL) 10 MG GEL Place 5 mg rectally once as needed (cramping). 2 Package 0  . ferrous sulfate 325 (65 FE) MG tablet Take 325 mg by mouth daily.      . fluticasone (FLONASE) 50 MCG/ACT nasal spray Place 2 sprays into both nostrils daily. 48 g 3  . Fluticasone-Salmeterol (ADVAIR DISKUS) 100-50 MCG/DOSE AEPB USE 1 INHALATION EVERY 12 HOURS, RINSE MOUTH 180 each 3  . FREESTYLE LITE test strip USE AS INSTRUCTED TO CHECK BLOOD SUGAR ONCE DAILY 100 each 1  . furosemide (LASIX) 40 MG tablet TAKE 1 TABLET EVERY MORNING (Patient taking differently: Take 20 mg by mouth daily. )  90 tablet 1  . gabapentin (NEURONTIN) 300 MG capsule TAKE 2 CAPSULES THREE TIMES A DAY (Patient taking differently: Take 600 mg by mouth 3 (three) times daily. ) 540 capsule 3  . HYDROcodone-acetaminophen (NORCO) 5-325 MG tablet Take 1 tablet by mouth 2 (two) times daily as needed for moderate pain. 50 tablet 0  . Lancets (FREESTYLE) lancets USE AS INSTRUCTED TO CHECK BLOOD SUGAR ONCE DAILY 100 each 1  . levothyroxine (SYNTHROID, LEVOTHROID) 125 MCG tablet TAKE 1 TABLET DAILY BEFORE BREAKFAST 90 tablet 4  . Magnesium Cl-Calcium Carbonate (SLOW-MAG PO) Take 500 mg by mouth.    . metFORMIN (GLUCOPHAGE) 500 MG tablet TAKE 1 TABLET AT BEDTIME (Patient taking differently: 1,000 mg. TAKE 2 TABLETS BY MOUTH ONCE DAILY AT BEDTIME.) 90 tablet 4  . methylcellulose (ARTIFICIAL TEARS) 1 % ophthalmic solution Place 1 drop into both eyes as needed. Dry eyes    . omeprazole (PRILOSEC) 20 MG capsule TAKE 1 CAPSULE DAILY 90 capsule 4  . pravastatin (PRAVACHOL) 40 MG tablet Take 1 tablet (40 mg total) by mouth daily. 90 tablet 1  . spironolactone (ALDACTONE) 50 MG  tablet 2 tablets in a.m., 1 tablet p.m. (Patient taking differently: TAKE 1 TABLET TWICE DAILY ALTERNATING DAYS WITH 1 TABLET DAILY.) 270 tablet 1  . temazepam (RESTORIL) 15 MG capsule 1-2 for sleep as needed 90 capsule 1  . vitamin B-12 (CYANOCOBALAMIN) 500 MCG tablet Take 500 mcg by mouth daily.      . Vitamin D, Ergocalciferol, (DRISDOL) 1.25 MG (50000 UT) CAPS capsule TAKE 1 CAPSULE EVERY FRIDAY 12 capsule 4  . warfarin (COUMADIN) 5 MG tablet TAKE 2 TABLETS DAILY EXCEPT TAKE 3 TABLETS ON MONDAYS ONLY OR AS DIRECTED BY ANTICOAGULATION CLINIC (Patient taking differently: Take 10 mg by mouth daily. TAKE 2 TABLETS BY MOUTH ONCE DAILY, EXCEPT WEDNESDAYS. ON Wednesday, TAKE 1 TABLET DAILY.) 205 tablet 4   No current facility-administered medications on file prior to visit.     Allergies  Allergen Reactions  . Adhesive [Tape] Rash    Family History  Problem Relation Age of Onset  . Sudden death Mother        car accident  . Cancer Father 63       lung  . Cancer Sister 68       lung cancer  . Asthma Daughter     Social History   Socioeconomic History  . Marital status: Widowed    Spouse name: Not on file  . Number of children: 2  . Years of education: Not on file  . Highest education level: Not on file  Occupational History  . Occupation: retired  Scientific laboratory technician  . Financial resource strain: Not on file  . Food insecurity:    Worry: Not on file    Inability: Not on file  . Transportation needs:    Medical: Not on file    Non-medical: Not on file  Tobacco Use  . Smoking status: Former Smoker    Packs/day: 0.50    Years: 10.00    Pack years: 5.00    Types: Cigarettes    Last attempt to quit: 07/31/1978    Years since quitting: 40.2  . Smokeless tobacco: Never Used  Substance and Sexual Activity  . Alcohol use: No  . Drug use: No  . Sexual activity: Never  Lifestyle  . Physical activity:    Days per week: Not on file    Minutes per session: Not on file  . Stress: Not  on file  Relationships  . Social connections:    Talks on phone: Not on file    Gets together: Not on file    Attends religious service: Not on file    Active member of club or organization: Not on file    Attends meetings of clubs or organizations: Not on file    Relationship status: Not on file  . Intimate partner violence:    Fear of current or ex partner: Not on file    Emotionally abused: Not on file    Physically abused: Not on file    Forced sexual activity: Not on file  Other Topics Concern  . Not on file  Social History Narrative   Widowed with 2 children,  Lives in a one story home and her son and his wife recently moved in with her.  Retired Freight forwarder for [employer].  Education: some college.     ROS General: Denies fever, chills, night sweats, changes in weight, changes in appetite HEENT: Denies headaches, ear pain, changes in vision, rhinorrhea, sore throat   + nasal drainage CV: Denies CP, palpitations, SOB, orthopnea Pulm: Denies SOB, wheezing  +cough GI: Denies abdominal pain, nausea, vomiting, diarrhea, constipation GU: Denies dysuria, hematuria, frequency, vaginal discharge Msk: +muscle cramps, joint pains, lymphedema, bilateral lower extremity edema Neuro: Denies weakness  +numbness, tingling Skin: Denies rashes, bruising Psych: Denies depression, anxiety, hallucinations   BP 104/60 (BP Location: Left Arm, Patient Position: Sitting, Cuff Size: Large) Comment (Cuff Size): thigh high cuff  Temp 99 F (37.2 C) (Oral)   Ht 5' 9"  (1.753 m)   Wt 233 lb (105.7 kg)   BMI 34.41 kg/m   Physical Exam Gen. Pleasant, well developed, well-nourished, in NAD HEENT - [place]/AT, PERRL, no scleral icterus, no nasal drainage, pharynx without erythema or exudate. Lungs: no use of accessory muscles, CTAB, no wheezes, rales or rhonchi Cardiovascular: RRR, No r/g/m, 2+ pitting edema in feet and ankles, 1 + below knee and trace pitting edema above knee. Abdomen: BS present, soft,  nontender,nondistended Musculoskeletal: No deformities, moves all four extremities, no cyanosis or clubbing, normal tone Neuro:  A&Ox3, CN II-XII intact, normal gait Skin:  Warm, dry, intact, no lesions  Recent Results (from the past 2160 hour(s))  POCT INR     Status: Abnormal   Collection Time: 07/05/18 12:00 AM  Result Value Ref Range   INR 3.6 (A) 2.0 - 3.0  POCT INR     Status: Abnormal   Collection Time: 08/02/18 12:00 AM  Result Value Ref Range   INR 1.5 (A) 2.0 - 3.0  POCT INR     Status: Abnormal   Collection Time: 08/16/18 12:00 AM  Result Value Ref Range   INR 3.1 (A) 2.0 - 3.0  TSH     Status: None   Collection Time: 08/16/18  9:21 AM  Result Value Ref Range   TSH 0.77 0.35 - 4.50 uIU/mL  Comprehensive metabolic panel     Status: Abnormal   Collection Time: 08/16/18  9:21 AM  Result Value Ref Range   Sodium 138 135 - 145 mEq/L   Potassium 4.4 3.5 - 5.1 mEq/L   Chloride 100 96 - 112 mEq/L   CO2 30 19 - 32 mEq/L   Glucose, Bld 104 (H) 70 - 99 mg/dL   BUN 22 6 - 23 mg/dL   Creatinine, Ser 1.05 0.40 - 1.20 mg/dL   Total Bilirubin 0.3 0.2 - 1.2 mg/dL   Alkaline Phosphatase 72 39 -  117 U/L   AST 23 0 - 37 U/L   ALT 23 0 - 35 U/L   Total Protein 7.5 6.0 - 8.3 g/dL   Albumin 4.1 3.5 - 5.2 g/dL   Calcium 10.1 8.4 - 10.5 mg/dL   GFR 62.57 >60.00 mL/min  Hemoglobin A1c     Status: None   Collection Time: 08/16/18  9:21 AM  Result Value Ref Range   Hgb A1c MFr Bld 6.5 4.6 - 6.5 %    Comment: Glycemic Control Guidelines for People with Diabetes:Non Diabetic:  <6%Goal of Therapy: <7%Additional Action Suggested:  >8%   POCT INR     Status: Abnormal   Collection Time: 09/13/18 12:00 AM  Result Value Ref Range   INR 1.9 (A) 2.0 - 3.0    Assessment/Plan: Allergic asthma, mild persistent, uncomplicated -Continue Flonase -Discussed switching medications if patient feels like it is no longer working -Consider local honey -Continue Advair and albuterol -Continue  follow-up with Dr. Annamaria Boots, pulmonology  BREAST CANCER, HX OF -In remission -s/p right mastectomy and lymph node dissection followed by bone marrow transplant -Continue following with Dr. Alen Blew, heme-onc  Hypothyroidism, unspecified type -Stable -Continue Synthroid 125 mcg daily -Continue following with Dr. Dwyane Dee  Arthritis -Continue following with pain management, Dr. Dossie Der -Continue current medications  History of gastric bypass -Stable -Continue multivitamins daily  Lymphedema -2/2 mastectomy and lymph node dissection -Given Rx for right arm compression sleeve  Bilateral lower extremity edema -Continue Lasix 40 mg daily -Discussed elevating feet when sitting -Given Rx for TED hose -Patient's lower extremities wrapped while in clinic -Given handout  Neuropathy -Continue follow-up with Dr. Dossie Der -Continue gabapentin  Essential hypertension: -Continue Lasix 40 mg daily and Spironolactone 50 mg daily -Continue lifestyle modifications  F/u PRN in the next month or 2  Grier Mitts, MD

## 2018-10-02 NOTE — Patient Instructions (Signed)
Edema  Edema is an abnormal buildup of fluids in the body tissues and under the skin. Swelling of the legs, feet, and ankles is a common symptom that becomes more likely as you get older. Swelling is also common in looser tissues, like around the eyes. When the affected area is squeezed, the fluid may move out of that spot and leave a dent for a few moments. This dent is called pitting edema. There are many possible causes of edema. Eating too much salt (sodium) and being on your feet or sitting for a long time can cause edema in your legs, feet, and ankles. Hot weather may make edema worse. Common causes of edema include:  Heart failure.  Liver or kidney disease.  Weak leg blood vessels.  Cancer.  An injury.  Pregnancy.  Medicines.  Being obese.  Low protein levels in the blood. Edema is usually painless. Your skin may look swollen or shiny. Follow these instructions at home:  Keep the affected body part raised (elevated) above the level of your heart when you are sitting or lying down.  Do not sit still or stand for long periods of time.  Do not wear tight clothing. Do not wear garters on your upper legs.  Exercise your legs to get your circulation going. This helps to move the fluid back into your blood vessels, and it may help the swelling go down.  Wear elastic bandages or support stockings to reduce swelling as told by your health care provider.  Eat a low-salt (low-sodium) diet to reduce fluid as told by your health care provider.  Depending on the cause of your swelling, you may need to limit how much fluid you drink (fluid restriction).  Take over-the-counter and prescription medicines only as told by your health care provider. Contact a health care provider if:  Your edema does not get better with treatment.  You have heart, liver, or kidney disease and have symptoms of edema.  You have sudden and unexplained weight gain. Get help right away if:  You develop  shortness of breath or chest pain.  You cannot breathe when you lie down.  You develop pain, redness, or warmth in the swollen areas.  You have heart, liver, or kidney disease and suddenly get edema.  You have a fever and your symptoms suddenly get worse. Summary  Edema is an abnormal buildup of fluids in the body tissues and under the skin.  Eating too much salt (sodium) and being on your feet or sitting for a long time can cause edema in your legs, feet, and ankles.  Keep the affected body part raised (elevated) above the level of your heart when you are sitting or lying down. This information is not intended to replace advice given to you by your health care provider. Make sure you discuss any questions you have with your health care provider. Document Released: 07/17/2005 Document Revised: 08/19/2016 Document Reviewed: 08/19/2016 Elsevier Interactive Patient Education  2019 [employer]  Lymphedema  Lymphedema is swelling that is caused by the abnormal collection of lymph in the tissues under the skin. Lymph is fluid from the tissues in your body that is removed through the lymphatic system. This system is part of your body's defense system (immune system) and includes lymph nodes and lymph vessels. The lymph vessels collect and carry the excess fluid, fats, proteins, and wastes from the tissues of the body to the bloodstream. This system also works to clean and remove bacteria and waste products  from the body. Lymphedema occurs when the lymphatic system is blocked. When the lymph vessels or lymph nodes are blocked or damaged, lymph does not drain properly. This causes an abnormal buildup of lymph, which leads to swelling in the affected area. This may include the trunk area, or an arm or leg. Lymphedema cannot be cured by medicines, but various methods can be used to help reduce the swelling. There are two types of lymphedema: primary lymphedema and secondary lymphedema. What are the  causes? The cause of this condition depends on the type of lymphedema that you have.  Primary lymphedema is caused by the absence of lymph vessels or having abnormal lymph vessels at birth.  Secondary lymphedema occurs when lymph vessels are blocked or damaged. Secondary lymphedema is more common. Common causes of lymph vessel blockage include: ? Skin infection, such as cellulitis. ? Infection by parasites (filariasis). ? Injury. ? Radiation therapy. ? Cancer. ? Formation of scar tissue. ? Surgery. What are the signs or symptoms? Symptoms of this condition include:  Swelling of the arm or leg.  A heavy or tight feeling in the arm or leg.  Swelling of the feet, toes, or fingers. Shoes or rings may fit more tightly than before.  Redness of the skin over the affected area.  Limited movement of the affected limb.  Sensitivity to touch or discomfort in the affected limb. How is this diagnosed? This condition may be diagnosed based on:  Your symptoms and medical history.  A physical exam.  Bioimpedance spectroscopy. In this test, painless electrical currents are used to measure fluid levels in your body.  Imaging tests, such as: ? Lymphoscintigraphy. In this test, a low dose of a radioactive substance is injected to trace the flow of lymph through the lymph vessels. ? MRI. ? CT scan. ? Duplex ultrasound. This test uses sound waves to produce images of the vessels and the blood flow on a screen. ? Lymphangiography. In this test, a contrast dye is injected into the lymph vessel to help show blockages. How is this treated? Treatment for this condition may depend on the cause of your lymphedema. Treatment may include:  Complete decongestive therapy (CDT). This is done by a certified lymphedema therapist to reduce fluid congestion. This therapy includes: ? Manual lymph drainage. This is a special massage technique that promotes lymph drainage out of a limb. ? Skin  care. ? Compression wrapping of the affected area. ? Specific exercises. Certain exercises can help fluid move out of the affected limb.  Compression. Various methods may be used to apply pressure to the affected limb to reduce the swelling. They include: ? Wearing compression stockings or sleeves on the affected limb. ? Wrapping the affected limb with special bandages.  Surgery. This is usually done for severe cases only. For example, surgery may be done if you have trouble moving the limb or if the swelling does not get better with other treatments. If an underlying condition is causing the lymphedema, treatment for that condition will be done. For example, antibiotic medicines may be used to treat an infection. Follow these instructions at home: Self-care  The affected area is more likely to become injured or infected. Take these steps to help prevent infection: ? Keep the affected area clean and dry. ? Use approved creams or lotions to keep the skin moisturized. ? Protect your skin from cuts:  Use gloves while cooking or gardening.  Do not walk barefoot.  If you shave the affected area, use  an Copy.  Do not wear tight clothes, shoes, or jewelry.  Eat a healthy diet that includes a lot of fruits and vegetables. Activity  Exercise regularly as directed by your health care provider.  Do not sit with your legs crossed.  When possible, keep the affected limb raised (elevated) above the level of your heart.  Avoid carrying things with an arm that is affected by lymphedema. General instructions  Wear compression stockings or sleeves as told by your health care provider.  Note any changes in size of the affected limb. You may be instructed to take regular measurements and keep track of them.  Take over-the-counter and prescription medicines only as told by your health care provider.  If you were prescribed an antibiotic medicine, take or apply it as told by your  health care provider. Do not stop using the antibiotic even if you start to feel better.  Do not use heating pads or ice packs over the affected area.  Avoid having blood draws, IV insertions, or blood pressure checked on the affected limb.  Keep all follow-up visits as told by your health care provider. This is important. Contact a health care provider if you:  Continue to have swelling in your limb.  Have a cut that does not heal.  Have redness or pain in the affected area. Get help right away if you:  Have new swelling in your limb that comes on suddenly.  Develop purplish spots, rash or sores (lesions) on your affected limb.  Have shortness of breath.  Have a fever or chills. Summary  Lymphedema is swelling that is caused by the abnormal collection of lymph in the tissues under the skin.  Lymph is fluid from the tissues in your body that is removed through the lymphatic system. This system collects and carries excess fluid, fats, proteins, and wastes from the tissues of the body to the bloodstream.  Lymphedema causes swelling, pain, and redness in the affected area. This may include the trunk area, or an arm or leg.  Treatment for this condition may depend on the cause of your lymphedema. Treatment may include complete decongestive therapy (CDT), compression methods, surgery, or treating the underlying cause. This information is not intended to replace advice given to you by your health care provider. Make sure you discuss any questions you have with your health care provider. Document Released: 05/14/2007 Document Revised: 07/30/2017 Document Reviewed: 07/30/2017 Elsevier Interactive Patient Education  2019 [employer].  Allergies, Adult An allergy means that your body reacts to something that bothers it (allergen). It is not a normal reaction. This can happen from something that you:  Eat.  Breathe in.  Touch. You can have an allergy (be allergic) to:  Outdoor  things, like: ? Pollen. ? Grass. ? Weeds.  Indoor things, like: ? Dust. ? Smoke. ? Pet dander.  Foods.  Medicines.  Things that bother your skin, like: ? Detergents. ? Chemicals. ? Latex.  Perfume.  Bugs. An allergy cannot spread from person to person (is not contagious). Follow these instructions at home:         Stay away from things that you know you are allergic to.  If you have allergies to things in the air, wash out your nose each day. Do it with one of these: ? A salt-water (saline) spray. ? A container (neti pot).  Take over-the-counter and prescription medicines only as told by your doctor.  Keep all follow-up visits as told by your doctor.  This is important.  If you are at risk for a very bad allergy reaction (anaphylaxis), keep an auto-injector with you all the time. This is called an epinephrine injection. ? This is pre-measured medicine with a needle. You can put it into your skin by yourself. ? Right after you have a very bad allergy reaction, you or a person with you must give the medicine in less than a few minutes. This is an emergency.  If you have ever had a very bad allergy reaction, wear a medical alert bracelet or necklace. Your very bad allergy should be written on it. Contact a health care provider if:  Your symptoms do not get better with treatment. Get help right away if:  You have symptoms of a very bad allergy reaction. These include: ? A swollen mouth, tongue, or throat. ? Pain or tightness in your chest. ? Trouble breathing. ? Being short of breath. ? Dizziness. ? Fainting. ? Very bad pain in your belly (abdomen). ? Throwing up (vomiting). ? Watery poop (diarrhea). Summary  An allergy means that your body reacts to something that bothers it (allergen). It is not a normal reaction.  Stay away from things that make your body react.  Take over-the-counter and prescription medicines only as told by your doctor.  If you are  at risk for a very bad allergy reaction, carry an auto-injector (epinephrine injection) all the time. Also, wear a medical alert bracelet or necklace so people know about your allergy. This information is not intended to replace advice given to you by your health care provider. Make sure you discuss any questions you have with your health care provider. Document Released: 11/11/2012 Document Revised: 10/30/2016 Document Reviewed: 10/30/2016 Elsevier Interactive Patient Education  2019 [employer].

## 2018-10-11 ENCOUNTER — Ambulatory Visit (INDEPENDENT_AMBULATORY_CARE_PROVIDER_SITE_OTHER): Payer: Medicare Other | Admitting: General Practice

## 2018-10-11 ENCOUNTER — Other Ambulatory Visit: Payer: Self-pay

## 2018-10-11 DIAGNOSIS — Z7901 Long term (current) use of anticoagulants: Secondary | ICD-10-CM | POA: Diagnosis not present

## 2018-10-11 LAB — POCT INR: INR: 2.1 (ref 2.0–3.0)

## 2018-10-11 NOTE — Patient Instructions (Addendum)
Pre visit review using our clinic review tool, if applicable. No additional management support is needed unless otherwise documented below in the visit note.  Continue to take 2 tablets daily except 1 tablet on Wednesdays.  Re-check in 4 weeks.    

## 2018-10-11 NOTE — Telephone Encounter (Signed)
Kelli Martinez ended up going to see her PCP which stated it was her allergies

## 2018-11-05 ENCOUNTER — Ambulatory Visit (INDEPENDENT_AMBULATORY_CARE_PROVIDER_SITE_OTHER): Payer: Medicare Other | Admitting: General Practice

## 2018-11-05 DIAGNOSIS — Z7901 Long term (current) use of anticoagulants: Secondary | ICD-10-CM

## 2018-11-05 LAB — POCT INR: INR: 2.2 (ref 2.0–3.0)

## 2018-11-05 NOTE — Patient Instructions (Addendum)
Pre visit review using our clinic review tool, if applicable. No additional management support is needed unless otherwise documented below in the visit note.  Continue to take 2 tablets daily except 1 tablet on Wednesdays.  Re-check in 4 weeks.    

## 2018-11-06 ENCOUNTER — Telehealth: Payer: Self-pay | Admitting: Internal Medicine

## 2018-11-06 NOTE — Telephone Encounter (Signed)
This sounds like possible pollen allergy.  Suggest claritin once daily, along with water to thin the mucus, and Mucinex-DM

## 2018-11-06 NOTE — Telephone Encounter (Signed)
Called and spoke with pt and stated to her the recommendations per CY. Pt expressed understanding. Nothing further needed.

## 2018-11-06 NOTE — Telephone Encounter (Signed)
Called and spoke with pt who stated she has had a cough and congestion x3 weeks now. Pt is coughing up thick clear mucus and states congestion is in both head and chest. Pt does have pain around eyes and at the bridge of nose and pt also has some pain in jaw and teeth. Pt does have nasal drainage which will drain down to her throat. Pt has had problems with sleeping at night due to being choked from the mucus.  Pt denies any fever at this time but states a couple weeks ago she went to PCP due to not feeling well and had a temp at that time of 99.  Pt denies any recent travel and has not been around anyone that has been sick.   Pt has tried OTC meds to see if it would help with her head congestion (off brand DM med) and pt stated this had helped with her cough some. Pt finished the OTC med up yesterday and wanted to call office to see if there was anything else that could be recommended.  Pt is now also having some problems with hoarseness.  Dr. Annamaria Boots, please advise recs for pt. Thanks!  Allergies  Allergen Reactions  . Adhesive [Tape] Rash    Current Outpatient Medications:  .  acarbose (PRECOSE) 25 MG tablet, TAKE 1 TABLET AT THE START OF BREAKFAST AND DINNER AND 2 TABLETS AT LUNCH, Disp: 360 tablet, Rfl: 4 .  albuterol (PROAIR HFA) 108 (90 Base) MCG/ACT inhaler, USE 2 INHALATIONS FOUR TIMES A DAY AS NEEDED ( AS NEEDED RESCUE INHALER), Disp: 54 g, Rfl: 3 .  baclofen (LIORESAL) 10 MG tablet, TAKE 1 TABLET TWICE A DAY, Disp: 180 tablet, Rfl: 4 .  baclofen (LIORESAL) 20 MG tablet, Take 1 tablet (20 mg total) by mouth daily. TAKE 1 TABLET 3 TIMES A DAY (Patient taking differently: Take 20 mg by mouth daily. ), Disp: 90 tablet, Rfl: 1 .  Blood Glucose Monitoring Suppl (FREESTYLE FREEDOM LITE) w/Device KIT, use as directed, Disp: 1 each, Rfl: 0 .  Blood Glucose Monitoring Suppl (FREESTYLE LITE) DEVI, Use to check blood sugar once a day DX code E11.65, Disp: 1 each, Rfl: 0 .  Calcium Carbonate-Vitamin  D (CALTRATE 600+D) 600-400 MG-UNIT tablet, Take 1 tablet by mouth daily., Disp: , Rfl:  .  clobetasol cream (TEMOVATE) 1.54 %, Apply 1 application topically 2 (two) times daily., Disp: 120 g, Rfl: 1 .  diazepam (DIASTAT ACUDIAL) 10 MG GEL, Place 5 mg rectally once as needed (cramping)., Disp: 2 Package, Rfl: 0 .  ferrous sulfate 325 (65 FE) MG tablet, Take 325 mg by mouth daily.  , Disp: , Rfl:  .  fluticasone (FLONASE) 50 MCG/ACT nasal spray, Place 2 sprays into both nostrils daily., Disp: 48 g, Rfl: 3 .  Fluticasone-Salmeterol (ADVAIR DISKUS) 100-50 MCG/DOSE AEPB, USE 1 INHALATION EVERY 12 HOURS, RINSE MOUTH, Disp: 180 each, Rfl: 3 .  FREESTYLE LITE test strip, USE AS INSTRUCTED TO CHECK BLOOD SUGAR ONCE DAILY, Disp: 100 each, Rfl: 1 .  furosemide (LASIX) 40 MG tablet, TAKE 1 TABLET EVERY MORNING (Patient taking differently: Take 20 mg by mouth daily. ), Disp: 90 tablet, Rfl: 1 .  gabapentin (NEURONTIN) 300 MG capsule, TAKE 2 CAPSULES THREE TIMES A DAY (Patient taking differently: Take 600 mg by mouth 3 (three) times daily. ), Disp: 540 capsule, Rfl: 3 .  HYDROcodone-acetaminophen (NORCO) 5-325 MG tablet, Take 1 tablet by mouth 2 (two) times daily as needed for moderate pain.,  Disp: 50 tablet, Rfl: 0 .  Lancets (FREESTYLE) lancets, USE AS INSTRUCTED TO CHECK BLOOD SUGAR ONCE DAILY, Disp: 100 each, Rfl: 1 .  levothyroxine (SYNTHROID, LEVOTHROID) 125 MCG tablet, TAKE 1 TABLET DAILY BEFORE BREAKFAST, Disp: 90 tablet, Rfl: 4 .  Magnesium Cl-Calcium Carbonate (SLOW-MAG PO), Take 500 mg by mouth., Disp: , Rfl:  .  metFORMIN (GLUCOPHAGE) 500 MG tablet, TAKE 1 TABLET AT BEDTIME (Patient taking differently: 1,000 mg. TAKE 2 TABLETS BY MOUTH ONCE DAILY AT BEDTIME.), Disp: 90 tablet, Rfl: 4 .  methylcellulose (ARTIFICIAL TEARS) 1 % ophthalmic solution, Place 1 drop into both eyes as needed. Dry eyes, Disp: , Rfl:  .  omeprazole (PRILOSEC) 20 MG capsule, TAKE 1 CAPSULE DAILY, Disp: 90 capsule, Rfl: 4 .   pravastatin (PRAVACHOL) 40 MG tablet, Take 1 tablet (40 mg total) by mouth daily., Disp: 90 tablet, Rfl: 1 .  spironolactone (ALDACTONE) 50 MG tablet, 2 tablets in a.m., 1 tablet p.m. (Patient taking differently: TAKE 1 TABLET TWICE DAILY ALTERNATING DAYS WITH 1 TABLET DAILY.), Disp: 270 tablet, Rfl: 1 .  temazepam (RESTORIL) 15 MG capsule, 1-2 for sleep as needed, Disp: 90 capsule, Rfl: 1 .  vitamin B-12 (CYANOCOBALAMIN) 500 MCG tablet, Take 500 mcg by mouth daily.  , Disp: , Rfl:  .  Vitamin D, Ergocalciferol, (DRISDOL) 1.25 MG (50000 UT) CAPS capsule, TAKE 1 CAPSULE EVERY FRIDAY, Disp: 12 capsule, Rfl: 4 .  warfarin (COUMADIN) 5 MG tablet, TAKE 2 TABLETS DAILY EXCEPT TAKE 3 TABLETS ON MONDAYS ONLY OR AS DIRECTED BY ANTICOAGULATION CLINIC (Patient taking differently: Take 10 mg by mouth daily. TAKE 2 TABLETS BY MOUTH ONCE DAILY, EXCEPT WEDNESDAYS. ON Wednesday, TAKE 1 TABLET DAILY.), Disp: 205 tablet, Rfl: 4

## 2018-11-18 ENCOUNTER — Other Ambulatory Visit: Payer: Self-pay

## 2018-11-18 ENCOUNTER — Other Ambulatory Visit (INDEPENDENT_AMBULATORY_CARE_PROVIDER_SITE_OTHER): Payer: Medicare Other

## 2018-11-18 DIAGNOSIS — E063 Autoimmune thyroiditis: Secondary | ICD-10-CM

## 2018-11-18 DIAGNOSIS — R252 Cramp and spasm: Secondary | ICD-10-CM

## 2018-11-18 DIAGNOSIS — E1142 Type 2 diabetes mellitus with diabetic polyneuropathy: Secondary | ICD-10-CM

## 2018-11-18 LAB — COMPREHENSIVE METABOLIC PANEL
ALT: 27 U/L (ref 0–35)
AST: 24 U/L (ref 0–37)
Albumin: 3.9 g/dL (ref 3.5–5.2)
Alkaline Phosphatase: 90 U/L (ref 39–117)
BUN: 23 mg/dL (ref 6–23)
CO2: 28 mEq/L (ref 19–32)
Calcium: 9.5 mg/dL (ref 8.4–10.5)
Chloride: 102 mEq/L (ref 96–112)
Creatinine, Ser: 0.93 mg/dL (ref 0.40–1.20)
GFR: 71.92 mL/min (ref >60.00)
Glucose, Bld: 97 mg/dL (ref 70–99)
Potassium: 4 mEq/L (ref 3.5–5.1)
Sodium: 137 mEq/L (ref 135–145)
Total Bilirubin: 0.2 mg/dL (ref 0.2–1.2)
Total Protein: 7.2 g/dL (ref 6.0–8.3)

## 2018-11-18 LAB — CBC
HCT: 38.3 % (ref 36.0–46.0)
Hemoglobin: 12.4 g/dL (ref 12.0–15.0)
MCHC: 32.3 g/dL (ref 30.0–36.0)
MCV: 85 fl (ref 78.0–100.0)
Platelets: 135 10*3/uL — ABNORMAL LOW (ref 150.0–400.0)
RBC: 4.51 Mil/uL (ref 3.87–5.11)
RDW: 15 % (ref 11.5–15.5)
WBC: 4.3 10*3/uL (ref 4.0–10.5)

## 2018-11-18 LAB — LIPID PANEL
Cholesterol: 163 mg/dL (ref 0–200)
HDL: 77.1 mg/dL (ref >39.00)
LDL Cholesterol: 76 mg/dL (ref 0–99)
NonHDL: 86.34
Total CHOL/HDL Ratio: 2
Triglycerides: 54 mg/dL (ref 0.0–149.0)
VLDL: 10.8 mg/dL (ref 0.0–40.0)

## 2018-11-18 LAB — HEMOGLOBIN A1C: Hgb A1c MFr Bld: 6.7 % — ABNORMAL HIGH (ref 4.6–6.5)

## 2018-11-18 LAB — TSH: TSH: 0.63 u[IU]/mL (ref 0.35–4.50)

## 2018-11-21 ENCOUNTER — Ambulatory Visit (INDEPENDENT_AMBULATORY_CARE_PROVIDER_SITE_OTHER): Payer: Medicare Other | Admitting: Endocrinology

## 2018-11-21 ENCOUNTER — Other Ambulatory Visit: Payer: Self-pay

## 2018-11-21 ENCOUNTER — Encounter: Payer: Self-pay | Admitting: Endocrinology

## 2018-11-21 DIAGNOSIS — E1142 Type 2 diabetes mellitus with diabetic polyneuropathy: Secondary | ICD-10-CM

## 2018-11-21 DIAGNOSIS — I872 Venous insufficiency (chronic) (peripheral): Secondary | ICD-10-CM

## 2018-11-21 MED ORDER — FUROSEMIDE 20 MG PO TABS: 20.0000 mg | ORAL_TABLET | Freq: Every day | ORAL | 1 refills | Status: DC

## 2018-11-21 NOTE — Progress Notes (Signed)
Patient ID: Kelli Martinez, female   DOB: 05/23/1946, 73 y.o.   MRN: [id number]    Chief complaint: Followup of various issues    Today's office visit was provided via telemedicine using video technique Explained to the patient and the the limitations of evaluation and management by telemedicine and the availability of in person appointments.  The patient understood the limitations and agreed to proceed. Patient also understood that the telehealth visit is billable. . Location of the patient: Home . Location of the provider: Office Only the patient and myself were participating in the encounter    History of Present Illness:  The patient is seen for the following problems:  1.  Postprandial hypoglycemia related to dumping syndrome   She has had postprandial hypoglycemia starting a couple of years after her gastric bypass surgery Her symptoms of blood sugars are mostly a feeling of significant weakness, some shakiness.  She will use glucose tablets or juice in order to relieve the symptoms, recently she thinks that 1 or 2 glucose tablets are usually adequate She has been to the dietitian and has been instructed on balanced low-fat meals with enough protein consistently and restricting carbohydrates including fruits   Recent history:  She has been treated with acarbose with benefit  She did try verapamil empirically but this did not help.  She is taking her Precose when she starts eating, 1 tablet at breakfast 2 at lunch and 1 at dinner  She has not had any documented low blood sugars She may feel a little hypoglycemic if she is late for lunch and not eating breakfast   She does try to have some protein with each meal  2.  SWELLING of the legs:  She has had significant problems with her legs at times Most of her swelling is toward the end of the day She was told to take 200 mg of Aldactone daily but not clear what dose she is taking as her last  prescription was for 50 mg, she takes 2 tablets alternating with 1 Also taking 40 mg Lasix daily without any excessive cramping currently   Renal function and potassium are stable with this regimen  Lab Results  Component Value Date   K 4.0 11/18/2018     PROBLEM 3:  DIABETES:  This has been mild and well controlled since her gastric bypass surgery  The A1c has not changed much and now 6.7  Current management, blood sugar patterns:  She is again checking her blood sugars very sporadically at home and only about once a week  Most of her readings are either in the morning or midday although may not always eat breakfast  She thinks her blood sugar of 127 after lunch was high but is not aware of what her blood sugar should be  Fasting blood sugars have been in the 80s at home although 97 in the lab  She is not able to do much physical activity partly because of being homebound  However her diet has been inconsistent and she is getting more carbohydrate like sandwiches and fruits but recently trying to cut back a little since she did not feel good with low carbohydrate  She is taking METFORMIN 500 mg at bedtime for some time Also taking Precose at each meal also to prevent reactive hypoglycemia    Wt Readings from Last 3 Encounters:  10/02/18 233 lb (105.7 kg)  09/04/18 233 lb (105.7 kg)  08/21/18 237 lb 12.8 oz (107.9 kg)  Lab Results  Component Value Date   HGBA1C 6.7 (H) 11/18/2018   HGBA1C 6.5 08/16/2018   HGBA1C 7.1 (H) 06/17/2018   Lab Results  Component Value Date   MICROALBUR <0.7 01/15/2018   LDLCALC 76 11/18/2018   CREATININE 0.93 11/18/2018      Other active problems: see review of systems      Allergies as of 11/21/2018      Reactions   Adhesive [tape] Rash      Medication List       Accurate as of November 21, 2018 10:53 AM. Always use your most recent med list.        acarbose 25 MG tablet Commonly known as:  PRECOSE TAKE 1 TABLET AT  THE START OF BREAKFAST AND DINNER AND 2 TABLETS AT LUNCH   albuterol 108 (90 Base) MCG/ACT inhaler Commonly known as:  ProAir HFA USE 2 INHALATIONS FOUR TIMES A DAY AS NEEDED ( AS NEEDED RESCUE INHALER)   baclofen 20 MG tablet Commonly known as:  LIORESAL Take 1 tablet (20 mg total) by mouth daily. TAKE 1 TABLET 3 TIMES A DAY   baclofen 10 MG tablet Commonly known as:  LIORESAL TAKE 1 TABLET TWICE A DAY   Caltrate 600+D 600-400 MG-UNIT tablet Generic drug:  Calcium Carbonate-Vitamin D Take 1 tablet by mouth daily.   clobetasol cream 0.05 % Commonly known as:  TEMOVATE Apply 1 application topically 2 (two) times daily.   diazepam 10 MG Gel Commonly known as:  DIASTAT ACUDIAL Place 5 mg rectally once as needed (cramping).   ferrous sulfate 325 (65 FE) MG tablet Take 325 mg by mouth daily.   fluticasone 50 MCG/ACT nasal spray Commonly known as:  FLONASE Place 2 sprays into both nostrils daily.   Fluticasone-Salmeterol 100-50 MCG/DOSE Aepb Commonly known as:  Advair Diskus USE 1 INHALATION EVERY 12 HOURS, RINSE MOUTH   freestyle lancets USE AS INSTRUCTED TO CHECK BLOOD SUGAR ONCE DAILY   FreeStyle Lite Devi Use to check blood sugar once a day DX code E11.65   FreeStyle Freedom Lite w/Device Kit use as directed   FREESTYLE LITE test strip Generic drug:  glucose blood USE AS INSTRUCTED TO CHECK BLOOD SUGAR ONCE DAILY   furosemide 40 MG tablet Commonly known as:  LASIX TAKE 1 TABLET EVERY MORNING   gabapentin 300 MG capsule Commonly known as:  NEURONTIN TAKE 2 CAPSULES THREE TIMES A DAY   HYDROcodone-acetaminophen 5-325 MG tablet Commonly known as:  Norco Take 1 tablet by mouth 2 (two) times daily as needed for moderate pain.   levothyroxine 125 MCG tablet Commonly known as:  SYNTHROID TAKE 1 TABLET DAILY BEFORE BREAKFAST   metFORMIN 500 MG tablet Commonly known as:  GLUCOPHAGE TAKE 1 TABLET AT BEDTIME   methylcellulose 1 % ophthalmic solution Commonly  known as:  ARTIFICIAL TEARS Place 1 drop into both eyes as needed. Dry eyes   omeprazole 20 MG capsule Commonly known as:  PRILOSEC TAKE 1 CAPSULE DAILY   pravastatin 40 MG tablet Commonly known as:  PRAVACHOL Take 1 tablet (40 mg total) by mouth daily.   SLOW-MAG PO Take 500 mg by mouth.   spironolactone 50 MG tablet Commonly known as:  Aldactone 2 tablets in a.m., 1 tablet p.m.   temazepam 15 MG capsule Commonly known as:  RESTORIL 1-2 for sleep as needed   vitamin B-12 500 MCG tablet Commonly known as:  CYANOCOBALAMIN Take 500 mcg by mouth daily.   Vitamin D (Ergocalciferol) 1.25 MG (  50000 UT) Caps capsule Commonly known as:  DRISDOL TAKE 1 CAPSULE EVERY FRIDAY   warfarin 5 MG tablet Commonly known as:  COUMADIN Take as directed by the anticoagulation clinic. If you are unsure how to take this medication, talk to your nurse or doctor. Original instructions:  TAKE 2 TABLETS DAILY EXCEPT TAKE 3 TABLETS ON MONDAYS ONLY OR AS DIRECTED BY ANTICOAGULATION CLINIC       Allergies:  Allergies  Allergen Reactions  . Adhesive [Tape] Rash    Past Medical History:  Diagnosis Date  . Allergy   . Anemia   . Arthritis   . Asthma   . Back pain, chronic    GETS INJECTIONS IN BACK  . Blind right eye    hemorrhage  . Blood transfusion   . Cancer ([place])    breast 1994  . Clotting disorder ([place])   . Diabetes mellitus without complication ([place])    takes precose  . Elevated liver enzymes   . Gallstones   . GERD (gastroesophageal reflux disease)   . HX: breast cancer   . Hyperlipidemia   . Hypertension   . Hypothyroid   . Lymphedema of arm    RT  . Morbid obesity ([place])   . OSA (obstructive sleep apnea)    has not used in 2 years-lost weight  . Osteoporosis   . Peripheral neuropathy    on gabapentin  . PONV (postoperative nausea and vomiting)   . Psoriasis   . Pulmonary embolism ([place]) 1998 / 1994 /1968  . Syncope and collapse 2011   due to anemia    Past  Surgical History:  Procedure Laterality Date  . ABDOMINAL HYSTERECTOMY    . BONE MARROW TRANSPLANT  1994  . CARDIOVASCULAR STRESS TEST  05/23/2005   EF 53%  . carpal tunnell     bil  . cataracts    . CHOLECYSTECTOMY  05/10/2012   Procedure: LAPAROSCOPIC CHOLECYSTECTOMY WITH INTRAOPERATIVE CHOLANGIOGRAM;  Surgeon: Pedro Earls, MD;  Location: [facility];  Service: General;  Laterality: N/A;  Laparoscopic Cholecystectomy with Intraoperative Cholangiogram  . GASTRIC BYPASS  2011   bariatric surgery  . HIATAL HERNIA REPAIR    . IVC filter     recurrent DVT  . KNEE ARTHROTOMY  1998  . MASS EXCISION Right 11/24/2016   Procedure: EXCISION RIGHT AXILLARY LESION AND CHEST WALL LESION;  Surgeon: Johnathan Hausen, MD;  Location: [facility];  Service: General;  Laterality: Right;  . MASTECTOMY     Right  . US ECHOCARDIOGRAPHY  10/27/08   EF 55-60%    Family History  Problem Relation Age of Onset  . Sudden death Mother        car accident  . Cancer Father 89       lung  . Cancer Sister 44       lung cancer  . Asthma Daughter     Social History:  reports that she quit smoking about 40 years ago. Her smoking use included cigarettes. She has a 5.00 pack-year smoking history. She has never used smokeless tobacco. She reports that she does not drink alcohol or use drugs.  Review of Systems   ANTICOAGULATION: Her Coumadin has been followed regularly in the clinic INR results as follows  Lab Results  Component Value Date   INR 2.2 11/05/2018   INR 2.1 10/11/2018   INR 1.9 (A) 09/13/2018    ABNORMAL liver functions: She has had these before transiently and no etiology found  Not clear if this was related to Lipitor that she had taken before  Lab Results  Component Value Date   ALT 27 11/18/2018   ALT 25 01/19/2016     Neuropathy with burning, some numbness and lower leg pain:  She gets some relief with nortriptyline and gabapentin  MUSCLE cramps/spasms: She is  being treated with 10 mg baclofen during the day twice daily and 20 mg at night She believes symptoms are better with drinking more water She also uses tonic water, mustard and magnesium supplements     Lymphedema right arm, secondary to  breast surgery, using  elastic compression stocking with relief   History of mild hypothyroidism: This has been controlled with 62.5 mcg ,  recent TSH as follows:  Lab Results  Component Value Date   TSH 0.63 11/18/2018    Hypercholesterolemia: LDL has been at target with pravastatin Liver functions are consistently normal  Lipid levels:  Lab Results  Component Value Date   CHOL 163 11/18/2018   HDL 77.10 11/18/2018   LDLCALC 76 11/18/2018   TRIG 54.0 11/18/2018   CHOLHDL 2 11/18/2018   Lab Results  Component Value Date   ALT 27 11/18/2018   ALT 25 01/19/2016    Blood pressure controlled with diuretics only    BP Readings from Last 3 Encounters:  10/02/18 104/60  09/04/18 120/80  08/21/18 110/72      LABS:  Lab on 11/18/2018  Component Date Value Ref Range Status  . WBC 11/18/2018 4.3  4.0 - 10.5 K/uL Final  . RBC 11/18/2018 4.51  3.87 - 5.11 Mil/uL Final  . Platelets 11/18/2018 135.0* 150.0 - 400.0 K/uL Final  . Hemoglobin 11/18/2018 12.4  12.0 - 15.0 g/dL Final  . HCT 11/18/2018 38.3  36.0 - 46.0 % Final  . MCV 11/18/2018 85.0  78.0 - 100.0 fl Final  . MCHC 11/18/2018 32.3  30.0 - 36.0 g/dL Final  . RDW 11/18/2018 15.0  11.5 - 15.5 % Final  . TSH 11/18/2018 0.63  0.35 - 4.50 uIU/mL Final  . Cholesterol 11/18/2018 163  0 - 200 mg/dL Final   ATP III Classification       Desirable:  < 200 mg/dL               Borderline High:  200 - 239 mg/dL          High:  > = 240 mg/dL  . Triglycerides 11/18/2018 54.0  0.0 - 149.0 mg/dL Final   Normal:  <150 mg/dLBorderline High:  150 - 199 mg/dL  . HDL 11/18/2018 77.10  >39.00 mg/dL Final  . VLDL 11/18/2018 10.8  0.0 - 40.0 mg/dL Final  . LDL Cholesterol 11/18/2018 76  0 - 99 mg/dL  Final  . Total CHOL/HDL Ratio 11/18/2018 2   Final                  Men          Women1/2 Average Risk     3.4          3.3Average Risk          5.0          4.42X Average Risk          9.6          7.13X Average Risk          15.0          11.0                      .  NonHDL 11/18/2018 86.34   Final   NOTE:  Non-HDL goal should be 30 mg/dL higher than patient's LDL goal (i.e. LDL goal of < 70 mg/dL, would have non-HDL goal of < 100 mg/dL)  . Sodium 11/18/2018 137  135 - 145 mEq/L Final  . Potassium 11/18/2018 4.0  3.5 - 5.1 mEq/L Final  . Chloride 11/18/2018 102  96 - 112 mEq/L Final  . CO2 11/18/2018 28  19 - 32 mEq/L Final  . Glucose, Bld 11/18/2018 97  70 - 99 mg/dL Final  . BUN 11/18/2018 23  6 - 23 mg/dL Final  . Creatinine, Ser 11/18/2018 0.93  0.40 - 1.20 mg/dL Final  . Total Bilirubin 11/18/2018 0.2  0.2 - 1.2 mg/dL Final  . Alkaline Phosphatase 11/18/2018 90  39 - 117 U/L Final  . AST 11/18/2018 24  0 - 37 U/L Final  . ALT 11/18/2018 27  0 - 35 U/L Final  . Total Protein 11/18/2018 7.2  6.0 - 8.3 g/dL Final  . Albumin 11/18/2018 3.9  3.5 - 5.2 g/dL Final  . Calcium 11/18/2018 9.5  8.4 - 10.5 mg/dL Final  . GFR 11/18/2018 71.92  >60.00 mL/min Final  . Hgb A1c MFr Bld 11/18/2018 6.7* 4.6 - 6.5 % Final   Glycemic Control Guidelines for People with Diabetes:Non Diabetic:  <6%Goal of Therapy: <7%Additional Action Suggested:  >8%   Anti-coag visit on 11/05/2018  Component Date Value Ref Range Status  . INR 11/05/2018 2.2  2.0 - 3.0 Final     EXAM:  There were no vitals taken for this visit.     Assessment/Plan:   1.  EDEMA: This is relatively better with taking Lasix daily However discussed that to avoid fluid depletion and electrolyte abnormalities she should increase her Aldactone to 150 mg daily and reduce Lasix to 20 mg daily She needs to confirm what dose of spironolactone she has at home Also discussed using Ace wrap if she cannot adequately use elastic stockings that  she has  She wants a prescription for 20 mg Lasix since she cannot break the 40 mg in half and this will be sent   2.Reactive hypoglycemia: Symptoms have been less frequent Advised her to cut back on carbohydrates consistently and avoid delaying her breakfast till midday  3. Mild diabetes: A1c 6.7 and likely higher than expected for her home readings Discussed blood sugar targets after meals To check more readings after meals   4.   Muscle cramps: Better with baclofen and magnesium supplements Continue follow-up with neurologist as needed  7.  Abnormal liver function: Resolved now  LIPIDS: Controlled on pravastatin 40  8.  Mild hypothyroidism: Well-controlled with similar doses for some time and TSH is normal again      There are no Patient Instructions on file for this visit.  Elayne Snare 11/21/2018, 10:53 AM

## 2018-11-25 ENCOUNTER — Other Ambulatory Visit: Payer: Self-pay

## 2018-11-25 ENCOUNTER — Telehealth: Payer: Self-pay | Admitting: Endocrinology

## 2018-11-25 MED ORDER — SPIRONOLACTONE 100 MG PO TABS: 100.0000 mg | ORAL_TABLET | Freq: Every day | ORAL | 1 refills | Status: DC

## 2018-11-25 NOTE — Telephone Encounter (Signed)
Called pt and informed her of MD message and new Rx for Aldactone 100mg  sent to [facility] so pt will have it when needed

## 2018-11-25 NOTE — Telephone Encounter (Signed)
She will take 20 mg of Lasix along with 100 alternating with 200 mg of spironolactone.  Next prescription for spironolactone should be 100 mg tablets.  Lasix 20 mg has been prescribed

## 2018-11-25 NOTE — Telephone Encounter (Signed)
Patients states she is taking 50 mcg of spironolactone (ALDACTONE) 50 MG tablet

## 2018-11-26 ENCOUNTER — Other Ambulatory Visit: Payer: Self-pay | Admitting: Endocrinology

## 2018-12-03 ENCOUNTER — Ambulatory Visit (INDEPENDENT_AMBULATORY_CARE_PROVIDER_SITE_OTHER): Payer: Medicare Other | Admitting: General Practice

## 2018-12-03 DIAGNOSIS — Z7901 Long term (current) use of anticoagulants: Secondary | ICD-10-CM | POA: Diagnosis not present

## 2018-12-03 LAB — POCT INR: INR: 1.8 — AB (ref 2.0–3.0)

## 2018-12-03 NOTE — Patient Instructions (Addendum)
Pre visit review using our clinic review tool, if applicable. No additional management support is needed unless otherwise documented below in the visit note.  Take 3 tablets today (5/5) and then continue to take 2 tablets daily except 1 tablet on Wednesdays.   Re-check in 4 weeks.

## 2018-12-04 ENCOUNTER — Ambulatory Visit: Payer: Medicare Other | Admitting: Internal Medicine

## 2018-12-19 ENCOUNTER — Other Ambulatory Visit: Payer: Self-pay | Admitting: Endocrinology

## 2018-12-24 ENCOUNTER — Telehealth: Payer: Self-pay

## 2018-12-25 ENCOUNTER — Encounter: Payer: Self-pay | Admitting: *Deleted

## 2018-12-25 NOTE — Telephone Encounter (Signed)
Pt is sch for a EViist on 12-27-18

## 2018-12-25 NOTE — Telephone Encounter (Signed)
Request for gabapentin was sent to the neurologist for refill.

## 2018-12-25 NOTE — Telephone Encounter (Signed)
Patient last seen in 10/2017 and due for follow-up.  Please offer pt e-visit/telephone visit so I can refill medications appropriately.

## 2018-12-27 ENCOUNTER — Other Ambulatory Visit: Payer: Self-pay

## 2018-12-27 ENCOUNTER — Telehealth (INDEPENDENT_AMBULATORY_CARE_PROVIDER_SITE_OTHER): Payer: Medicare Other | Admitting: Neurology

## 2018-12-27 DIAGNOSIS — R252 Cramp and spasm: Secondary | ICD-10-CM

## 2018-12-27 DIAGNOSIS — G629 Polyneuropathy, unspecified: Secondary | ICD-10-CM

## 2018-12-27 NOTE — Progress Notes (Signed)
    Virtual Visit via Telephone Note The purpose of this virtual visit is to provide medical care while limiting exposure to the novel coronavirus.    Consent was obtained for phone visit:  Yes.   Answered questions that patient had about telehealth interaction:  Yes.   I discussed the limitations, risks, security and privacy concerns of performing an evaluation and management service by telephone. I also discussed with the patient that there may be a patient responsible charge related to this service. The patient expressed understanding and agreed to proceed.  Pt location: Home Physician Location: office Name of referring provider:  Billie Ruddy, MD I connected with .Emmani Lesueur Zale at patients initiation/request on 12/27/2018 at  1:30 PM EDT by telephone and verified that I am speaking with the correct person using two identifiers.  Pt MRN:  [id number] Pt DOB:  24-Mar-1946   History of Present Illness: This is a 73 year-old female who was last seen in 10/2017 for muscle cramps and neuropathy.  Since her last visit, cramps are improved.  She is taking baclofen 10mg  in the morning, 10mg  afternoon, and 20mg  at bedtime.  Her spironolactone was reduced and she started magnesium.  A combination of all of this has significantly reduced the intensity of pain.  She had MRI of the lumbar spine in October 2019 which showed lumbar spondylosis with disc extrusion at L3-4 and mild to moderate bilateral L5 foraminal stenosis.  She so neurosurgery who did not feel that she was a surgical candidate and has been seeing Dr. Herma Mering for ESI and pain.  Overall, the pain is also improved.  She has episodic sharp pain in the toes from diabetic neuropathy and she gets relief with OTC ointment.  No falls or imbalance.    Assessment and Plan:   1.  Muscle cramps, improved on current regimen of baclofen 10mg  in the morning, 10mg  in the afternoon, and 20mg  at bedtime + magnesium.  Cramps are likely contributed  by known lumbar spondylosis. She was encouraged to stay well-hydrated and start leg stretches and low back strengthening exercises.  She tells me that Dr. Jules Husbands had referred her to physical therapy however this was stopped due to the coronavirus pandemic  2.  neuropathy due to diabetes and chemotherapy.  Overall, symptoms are stable and mild.  She applies topical over-the-counter ointment which provides relief.  Her diabetes is well controlled with last HbA1c 6.7, she was praised for doing such a great job and encouraged to continue this trend.  Follow Up Instructions:   I discussed the assessment and treatment plan with the patient. The patient was provided an opportunity to ask questions and all were answered. The patient agreed with the plan and demonstrated an understanding of the instructions.   The patient was advised to call back or seek an in-person evaluation if the symptoms worsen or if the condition fails to improve as anticipated.    Total Time spent in visit with the patient was:  15 min, of which 100% of the time was spent in counseling and/or coordinating care.   Pt understands and agrees with the plan of care outlined.     Alda Berthold, DO

## 2018-12-31 ENCOUNTER — Other Ambulatory Visit: Payer: Self-pay

## 2018-12-31 ENCOUNTER — Ambulatory Visit (INDEPENDENT_AMBULATORY_CARE_PROVIDER_SITE_OTHER): Payer: Medicare Other | Admitting: General Practice

## 2018-12-31 DIAGNOSIS — Z7901 Long term (current) use of anticoagulants: Secondary | ICD-10-CM | POA: Diagnosis not present

## 2018-12-31 LAB — POCT INR: INR: 2.7 (ref 2.0–3.0)

## 2018-12-31 NOTE — Patient Instructions (Signed)
Pre visit review using our clinic review tool, if applicable. No additional management support is needed unless otherwise documented below in the visit note.  Continue to take 2 tablets daily except 1 tablet on Wednesdays.  Re-check in 4 weeks.    

## 2019-01-14 ENCOUNTER — Other Ambulatory Visit: Payer: Self-pay | Admitting: Endocrinology

## 2019-01-28 ENCOUNTER — Ambulatory Visit (INDEPENDENT_AMBULATORY_CARE_PROVIDER_SITE_OTHER): Payer: Medicare Other | Admitting: General Practice

## 2019-01-28 ENCOUNTER — Other Ambulatory Visit: Payer: Self-pay

## 2019-01-28 DIAGNOSIS — Z7901 Long term (current) use of anticoagulants: Secondary | ICD-10-CM | POA: Diagnosis not present

## 2019-01-28 LAB — POCT INR: INR: 2.2 (ref 2.0–3.0)

## 2019-01-28 NOTE — Patient Instructions (Addendum)
Pre visit review using our clinic review tool, if applicable. No additional management support is needed unless otherwise documented below in the visit note.  Continue to take 2 tablets daily except 1 tablet on Wednesdays.  Re-check in 4 weeks.    

## 2019-01-30 ENCOUNTER — Other Ambulatory Visit: Payer: Self-pay | Admitting: Neurology

## 2019-02-05 ENCOUNTER — Telehealth: Payer: Self-pay | Admitting: Family Medicine

## 2019-02-05 NOTE — Telephone Encounter (Signed)
Medication Refill - Medication: The patient would like to be prescribed a pill and cream for a yeast infection.  Has the patient contacted their pharmacy? No. (Agent: If no, request that the patient contact the pharmacy for the refill.)   Preferred Pharmacy (with phone number or street name):  [facility] [phone] - [place], [place] - [phone] (Phone) [phone] (Fax)     Agent: Please be advised that RX refills may take up to 3 business days. We ask that you follow-up with your pharmacy.

## 2019-02-06 NOTE — Telephone Encounter (Signed)
What symptoms is pt having? Has she tried anything OTC?

## 2019-02-06 NOTE — Telephone Encounter (Signed)
Pt states that she tried OTC Miconazole for 7 days, felt better for a few days and then started itching and burning again this week. She is requesting for some stronger medication states that she is sore from itching. Please advise

## 2019-02-07 ENCOUNTER — Other Ambulatory Visit: Payer: Self-pay | Admitting: Family Medicine

## 2019-02-07 MED ORDER — FLUCONAZOLE 150 MG PO TABS: 150.0000 mg | ORAL_TABLET | Freq: Once | ORAL | 0 refills | Status: AC

## 2019-02-07 NOTE — Telephone Encounter (Signed)
Patient notified of Dr.Banks message. Verbalized understanding. No questions or concerns

## 2019-02-07 NOTE — Telephone Encounter (Signed)
Rx for diflucan sent to pt's pharmacy on [address].  Pt can continue to use OTC cream.  If symptoms continue in 3 days pt should be seen for further eval.  Diflucan may increase pt's INR.

## 2019-02-17 ENCOUNTER — Other Ambulatory Visit (INDEPENDENT_AMBULATORY_CARE_PROVIDER_SITE_OTHER): Payer: Medicare Other

## 2019-02-17 ENCOUNTER — Other Ambulatory Visit: Payer: Self-pay

## 2019-02-17 DIAGNOSIS — E1142 Type 2 diabetes mellitus with diabetic polyneuropathy: Secondary | ICD-10-CM

## 2019-02-17 LAB — URINALYSIS, ROUTINE W REFLEX MICROSCOPIC
Bilirubin Urine: NEGATIVE
Ketones, ur: NEGATIVE
Leukocytes,Ua: NEGATIVE
Nitrite: NEGATIVE
RBC / HPF: NONE SEEN (ref 0–?)
Specific Gravity, Urine: 1.01 (ref 1.000–1.030)
Total Protein, Urine: NEGATIVE
Urine Glucose: NEGATIVE
Urobilinogen, UA: 0.2 (ref 0.0–1.0)
WBC, UA: NONE SEEN (ref 0–?)
pH: 7 (ref 5.0–8.0)

## 2019-02-17 LAB — COMPREHENSIVE METABOLIC PANEL
ALT: 28 U/L (ref 0–35)
AST: 29 U/L (ref 0–37)
Albumin: 4 g/dL (ref 3.5–5.2)
Alkaline Phosphatase: 82 U/L (ref 39–117)
BUN: 19 mg/dL (ref 6–23)
CO2: 28 mEq/L (ref 19–32)
Calcium: 9.6 mg/dL (ref 8.4–10.5)
Chloride: 101 mEq/L (ref 96–112)
Creatinine, Ser: 0.94 mg/dL (ref 0.40–1.20)
GFR: 70.99 mL/min (ref >60.00)
Glucose, Bld: 106 mg/dL — ABNORMAL HIGH (ref 70–99)
Potassium: 4.5 mEq/L (ref 3.5–5.1)
Sodium: 137 mEq/L (ref 135–145)
Total Bilirubin: 0.3 mg/dL (ref 0.2–1.2)
Total Protein: 7.1 g/dL (ref 6.0–8.3)

## 2019-02-17 LAB — HEMOGLOBIN A1C: Hgb A1c MFr Bld: 6.9 % — ABNORMAL HIGH (ref 4.6–6.5)

## 2019-02-17 LAB — MICROALBUMIN / CREATININE URINE RATIO
Creatinine,U: 65.6 mg/dL
Microalb Creat Ratio: 1.1 mg/g (ref 0.0–30.0)
Microalb, Ur: <0.7 mg/dL (ref 0.0–1.9)

## 2019-02-19 ENCOUNTER — Other Ambulatory Visit: Payer: Self-pay

## 2019-02-20 ENCOUNTER — Other Ambulatory Visit: Payer: Self-pay

## 2019-02-20 ENCOUNTER — Encounter: Payer: Self-pay | Admitting: Endocrinology

## 2019-02-20 ENCOUNTER — Ambulatory Visit (INDEPENDENT_AMBULATORY_CARE_PROVIDER_SITE_OTHER): Payer: Medicare Other | Admitting: Endocrinology

## 2019-02-20 DIAGNOSIS — K912 Postsurgical malabsorption, not elsewhere classified: Secondary | ICD-10-CM | POA: Diagnosis not present

## 2019-02-20 DIAGNOSIS — R600 Localized edema: Secondary | ICD-10-CM | POA: Diagnosis not present

## 2019-02-20 DIAGNOSIS — E1142 Type 2 diabetes mellitus with diabetic polyneuropathy: Secondary | ICD-10-CM | POA: Diagnosis not present

## 2019-02-20 DIAGNOSIS — E7800 Pure hypercholesterolemia, unspecified: Secondary | ICD-10-CM

## 2019-02-20 DIAGNOSIS — E039 Hypothyroidism, unspecified: Secondary | ICD-10-CM | POA: Diagnosis not present

## 2019-02-20 MED ORDER — FREESTYLE FREEDOM LITE W/DEVICE KIT: PACK | 0 refills | Status: AC

## 2019-02-20 NOTE — Progress Notes (Signed)
Patient ID: Kelli Martinez, female   DOB: October 21, 1945, 73 y.o.   MRN: [id number]    Chief complaint: Followup of various issues    Today's office visit was provided via telemedicine using video technique Explained to the patient and the the limitations of evaluation and management by telemedicine and the availability of in person appointments.  The patient understood the limitations and agreed to proceed. Patient also understood that the telehealth visit is billable. . Location of the patient: Home . Location of the provider: Office Only the patient and myself were participating in the encounter    History of Present Illness:  The patient is seen for the following problems:  1.  Postprandial hypoglycemia related to dumping syndrome   She has had postprandial hypoglycemia starting a couple of years after her gastric bypass surgery Her symptoms of blood sugars are mostly a feeling of significant weakness, some shakiness.  She will use glucose tablets or juice in order to relieve the symptoms, recently she thinks that 1 or 2 glucose tablets are usually adequate She has been to the dietitian and has been instructed on balanced low-fat meals with enough protein consistently and restricting carbohydrates including fruits   Recent history:  She has been treated with acarbose with each meal  She did try verapamil previously but this did not help.  She is taking her Precose when she starts eating, 1 tablet at breakfast 2 at lunch and 1 at dinner  She still has occasional hypoglycemia Once had a low sugar before lunch probably from a delayed meal and she thinks the blood sugar was 46 She tries to eat a protein with every meal but sometimes only yogurt in the morning and no snacks Overall her appetite is somewhat decreased   2.  SWELLING of the legs:  She has had significant problems with her legs chronically  Most of the leg swelling is toward the end of the day  She was told to increase her dose of Aldactone to 150 mg average per day and she is using 100 mg dose and 50 mg dose daily for this in divided doses  Also taking 20 mg Lasix daily, previously was on 40 mg without any excessive cramping However she thinks her swelling is again increasing and more on the left side As before she does not like to wear compression stockings because of discomfort and difficulty putting them on  Renal function and potassium are still normal with this regimen  Lab Results  Component Value Date   K 4.5 02/17/2019     PROBLEM 3:  DIABETES:  This has been mild and well controlled since her gastric bypass surgery  The A1c has being higher than expected and now 6.9  Current management, blood sugar patterns:  She is again checking her blood sugars very occasionally  She is not very active details  However she thinks her weight is down at least 4 pounds since her last weight being checked  Motions are relatively small  Occasionally may only eat fruit as a meal but usually otherwise a yogurt in the morning  GLUCOSE range recently 76-98 with readings below 100 in the morning and only sporadic readings later in the day  She is taking METFORMIN 500 mg at bedtime for fasting hyperglycemia and avoiding daytime hypoglycemia Also taking Precose at each meal also to prevent reactive hypoglycemia as discussed above    Wt Readings from Last 3 Encounters:  10/02/18 233 lb (105.7 kg)  09/04/18 233 lb (105.7 kg)  08/21/18 237 lb 12.8 oz (107.9 kg)    Lab Results  Component Value Date   HGBA1C 6.9 (H) 02/17/2019   HGBA1C 6.7 (H) 11/18/2018   HGBA1C 6.5 08/16/2018   Lab Results  Component Value Date   MICROALBUR <0.7 02/17/2019   LDLCALC 76 11/18/2018   CREATININE 0.94 02/17/2019      Other active problems: see review of systems      Allergies as of 02/20/2019      Reactions   Adhesive [tape] Rash      Medication List       Accurate as of  February 20, 2019 10:17 AM. If you have any questions, ask your nurse or doctor.        acarbose 25 MG tablet Commonly known as: PRECOSE TAKE 1 TABLET AT THE START OF BREAKFAST AND DINNER AND 2 TABLETS AT LUNCH What changed: additional instructions   albuterol 108 (90 Base) MCG/ACT inhaler Commonly known as: ProAir HFA USE 2 INHALATIONS FOUR TIMES A DAY AS NEEDED ( AS NEEDED RESCUE INHALER)   baclofen 10 MG tablet Commonly known as: LIORESAL TAKE 1 TABLET TWICE A DAY   baclofen 20 MG tablet Commonly known as: LIORESAL Take 1 tablet (20 mg total) by mouth daily.   Caltrate 600+D 600-400 MG-UNIT tablet Generic drug: Calcium Carbonate-Vitamin D Take 1 tablet by mouth daily.   clobetasol cream 0.05 % Commonly known as: TEMOVATE APPLY 1 APPLICATION TOPICALLY TWICE A DAY   diazepam 10 MG Gel Commonly known as: DIASTAT ACUDIAL Place 5 mg rectally once as needed (cramping).   ferrous sulfate 325 (65 FE) MG tablet Take 325 mg by mouth daily.   fluticasone 50 MCG/ACT nasal spray Commonly known as: FLONASE Place 2 sprays into both nostrils daily.   Fluticasone-Salmeterol 100-50 MCG/DOSE Aepb Commonly known as: Advair Diskus USE 1 INHALATION EVERY 12 HOURS, RINSE MOUTH   freestyle lancets USE AS INSTRUCTED TO CHECK BLOOD SUGAR ONCE DAILY   FreeStyle Lite Devi Use to check blood sugar once a day DX code E11.65   FreeStyle Freedom Lite w/Device Kit use as directed   FREESTYLE LITE test strip Generic drug: glucose blood USE AS INSTRUCTED TO CHECK BLOOD SUGAR ONCE DAILY   furosemide 20 MG tablet Commonly known as: LASIX Take 1 tablet (20 mg total) by mouth daily.   gabapentin 300 MG capsule Commonly known as: NEURONTIN TAKE 2 CAPSULES THREE TIMES A DAY   HYDROcodone-acetaminophen 5-325 MG tablet Commonly known as: Norco Take 1 tablet by mouth 2 (two) times daily as needed for moderate pain.   levothyroxine 125 MCG tablet Commonly known as: SYNTHROID TAKE 1 TABLET  DAILY BEFORE BREAKFAST   metFORMIN 500 MG tablet Commonly known as: GLUCOPHAGE TAKE 1 TABLET AT BEDTIME What changed:   how to take this  when to take this  additional instructions   methylcellulose 1 % ophthalmic solution Commonly known as: ARTIFICIAL TEARS Place 1 drop into both eyes as needed. Dry eyes   omeprazole 20 MG capsule Commonly known as: PRILOSEC TAKE 1 CAPSULE DAILY   pravastatin 40 MG tablet Commonly known as: PRAVACHOL TAKE 1 TABLET DAILY   SLOW-MAG PO Take 500 mg by mouth.   spironolactone 100 MG tablet Commonly known as: ALDACTONE Take 1 tablet (100 mg total) by mouth daily. Take 145m by mouth every other day alternating 2091m   temazepam 15 MG capsule Commonly known as: RESTORIL 1-2 for sleep as needed   vitamin B-12  500 MCG tablet Commonly known as: CYANOCOBALAMIN Take 500 mcg by mouth daily.   Vitamin D (Ergocalciferol) 1.25 MG (50000 UT) Caps capsule Commonly known as: DRISDOL TAKE 1 CAPSULE EVERY FRIDAY   warfarin 5 MG tablet Commonly known as: COUMADIN Take as directed by the anticoagulation clinic. If you are unsure how to take this medication, talk to your nurse or doctor. Original instructions: TAKE 2 TABLETS DAILY EXCEPT TAKE 3 TABLETS ON MONDAYS ONLY OR AS DIRECTED BY ANTICOAGULATION CLINIC What changed:   how much to take  how to take this  when to take this  additional instructions       Allergies:  Allergies  Allergen Reactions  . Adhesive [Tape] Rash    Past Medical History:  Diagnosis Date  . Allergy   . Anemia   . Arthritis   . Asthma   . Back pain, chronic    GETS INJECTIONS IN BACK  . Blind right eye    hemorrhage  . Blood transfusion   . Cancer ([place])    breast 1994  . Clotting disorder ([place])   . Diabetes mellitus without complication ([place])    takes precose  . Elevated liver enzymes   . Gallstones   . GERD (gastroesophageal reflux disease)   . HX: breast cancer   . Hyperlipidemia   .  Hypertension   . Hypothyroid   . Lymphedema of arm    RT  . Morbid obesity ([place])   . OSA (obstructive sleep apnea)    has not used in 2 years-lost weight  . Osteoporosis   . Peripheral neuropathy    on gabapentin  . PONV (postoperative nausea and vomiting)   . Psoriasis   . Pulmonary embolism ([place]) 1998 / 1994 /1968  . Syncope and collapse 2011   due to anemia    Past Surgical History:  Procedure Laterality Date  . ABDOMINAL HYSTERECTOMY    . BONE MARROW TRANSPLANT  1994  . CARDIOVASCULAR STRESS TEST  05/23/2005   EF 53%  . carpal tunnell     bil  . cataracts    . CHOLECYSTECTOMY  05/10/2012   Procedure: LAPAROSCOPIC CHOLECYSTECTOMY WITH INTRAOPERATIVE CHOLANGIOGRAM;  Surgeon: Pedro Earls, MD;  Location: [facility];  Service: General;  Laterality: N/A;  Laparoscopic Cholecystectomy with Intraoperative Cholangiogram  . GASTRIC BYPASS  2011   bariatric surgery  . HIATAL HERNIA REPAIR    . IVC filter     recurrent DVT  . KNEE ARTHROTOMY  1998  . MASS EXCISION Right 11/24/2016   Procedure: EXCISION RIGHT AXILLARY LESION AND CHEST WALL LESION;  Surgeon: Johnathan Hausen, MD;  Location: [facility];  Service: General;  Laterality: Right;  . MASTECTOMY     Right  . US ECHOCARDIOGRAPHY  10/27/08   EF 55-60%    Family History  Problem Relation Age of Onset  . Sudden death Mother        car accident  . Cancer Father 64       lung  . Cancer Sister 61       lung cancer  . Asthma Daughter     Social History:  reports that she quit smoking about 40 years ago. Her smoking use included cigarettes. She has a 5.00 pack-year smoking history. She has never used smokeless tobacco. She reports that she does not drink alcohol or use drugs.  Review of Systems   ANTICOAGULATION: Her Coumadin has been followed regularly in the clinic INR results as follows  Lab Results  Component Value Date   INR 2.2 01/28/2019   INR 2.7 12/31/2018   INR 1.8 (A) 12/03/2018     ABNORMAL liver functions: She has had these before transiently and no etiology found Does not appear to be medication related  Lab Results  Component Value Date   ALT 28 02/17/2019   ALT 25 01/19/2016     Neuropathy with burning, some numbness and lower leg pain:  She gets relief with nortriptyline and gabapentin  MUSCLE cramps/spasms: She is being treated with 10 mg baclofen during the day twice daily and 20 mg at night Followed by neurologist She may have more symptoms in her back pain is worse She also uses tonic water, mustard and magnesium supplements    Lymphedema right arm, secondary to  breast surgery, using  elastic compression stocking with relief   History of mild hypothyroidism: This has been controlled with 62.5 mcg ,  recent TSH as follows:  Lab Results  Component Value Date   TSH 0.63 11/18/2018    Hypercholesterolemia: LDL has been at target with pravastatin Liver functions are consistently normal  Lipid levels:  Lab Results  Component Value Date   CHOL 163 11/18/2018   HDL 77.10 11/18/2018   LDLCALC 76 11/18/2018   TRIG 54.0 11/18/2018   CHOLHDL 2 11/18/2018   Lab Results  Component Value Date   ALT 28 02/17/2019   ALT 25 01/19/2016    Blood pressure controlled with diuretics only    BP Readings from Last 3 Encounters:  10/02/18 104/60  09/04/18 120/80  08/21/18 110/72      LABS:  Lab on 02/17/2019  Component Date Value Ref Range Status  . Sodium 02/17/2019 137  135 - 145 mEq/L Final  . Potassium 02/17/2019 4.5  3.5 - 5.1 mEq/L Final  . Chloride 02/17/2019 101  96 - 112 mEq/L Final  . CO2 02/17/2019 28  19 - 32 mEq/L Final  . Glucose, Bld 02/17/2019 106* 70 - 99 mg/dL Final  . BUN 02/17/2019 19  6 - 23 mg/dL Final  . Creatinine, Ser 02/17/2019 0.94  0.40 - 1.20 mg/dL Final  . Total Bilirubin 02/17/2019 0.3  0.2 - 1.2 mg/dL Final  . Alkaline Phosphatase 02/17/2019 82  39 - 117 U/L Final  . AST 02/17/2019 29  0 - 37 U/L Final   . ALT 02/17/2019 28  0 - 35 U/L Final  . Total Protein 02/17/2019 7.1  6.0 - 8.3 g/dL Final  . Albumin 02/17/2019 4.0  3.5 - 5.2 g/dL Final  . Calcium 02/17/2019 9.6  8.4 - 10.5 mg/dL Final  . GFR 02/17/2019 70.99  >60.00 mL/min Final  . Hgb A1c MFr Bld 02/17/2019 6.9* 4.6 - 6.5 % Final   Glycemic Control Guidelines for People with Diabetes:Non Diabetic:  <6%Goal of Therapy: <7%Additional Action Suggested:  >8%   . Color, Urine 02/17/2019 YELLOW  Yellow;Lt. Yellow;Straw;Dark Yellow;Amber;Green;Red;Brown Final  . APPearance 02/17/2019 CLEAR  Clear;Turbid;Slightly Cloudy;Cloudy Final  . Specific Gravity, Urine 02/17/2019 1.010  1.000 - 1.030 Final  . pH 02/17/2019 7.0  5.0 - 8.0 Final  . Total Protein, Urine 02/17/2019 NEGATIVE  Negative Final  . Urine Glucose 02/17/2019 NEGATIVE  Negative Final  . Ketones, ur 02/17/2019 NEGATIVE  Negative Final  . Bilirubin Urine 02/17/2019 NEGATIVE  Negative Final  . Hgb urine dipstick 02/17/2019 TRACE-LYSED* Negative Final  . Urobilinogen, UA 02/17/2019 0.2  0.0 - 1.0 Final  . Chalmers Guest 02/17/2019 NEGATIVE  Negative Final  .  Nitrite 02/17/2019 NEGATIVE  Negative Final  . WBC, UA 02/17/2019 none seen  0-2/hpf Final  . RBC / HPF 02/17/2019 none seen  0-2/hpf Final  . Squamous Epithelial / LPF 02/17/2019 Rare(0-4/hpf)  Rare(0-4/hpf) Final  . Microalb, Ur 02/17/2019 <0.7  0.0 - 1.9 mg/dL Final  . Creatinine,U 02/17/2019 65.6  mg/dL Final  . Microalb Creat Ratio 02/17/2019 1.1  0.0 - 30.0 mg/g Final  Anti-coag visit on 01/28/2019  Component Date Value Ref Range Status  . INR 01/28/2019 2.2  2.0 - 3.0 Final     EXAM:  There were no vitals taken for this visit.     Assessment/Plan:   1.  EDEMA: This is variable and she does better with more Lasix at times She is tolerating Aldactone without hyperkalemia but this is not consistently helping her For now she can increase the dose to 40 alternating with 20 mg until her edema is better again  Continue 150 mg of Aldactone daily    2.Reactive hypoglycemia: Symptoms have been infrequent However since her blood sugars have been as low as 46 she needs to be more consistent with diet as tolerance getting protein at each meal and having a snack if she is not able to eat on time Continue acarbose but make sure she takes consistent doses before eating May consider increasing the morning dose to 2 tablets  3. Mild diabetes: A1c 6.9 and  higher than expected for her home readings Will need a new meter to make sure she is getting an accurate reading at home, lab glucose 106 postprandial but home readings are below 100 consistently Meanwhile continue metformin 500 at bedtime   4.   Muscle cramps: Controlled with baclofen and magnesium supplements Continue follow-up with neurologist  7.  Abnormal liver function: Not present now  LIPIDS: Controlled on pravastatin 40 mg daily  8.  Mild hypothyroidism: Stable and will need to be checking on the next visit   Total visit time for evaluation and management of multiple problems and counseling =25 minutes   There are no Patient Instructions on file for this visit.  Elayne Snare 02/20/2019, 10:17 AM

## 2019-03-04 ENCOUNTER — Ambulatory Visit (INDEPENDENT_AMBULATORY_CARE_PROVIDER_SITE_OTHER): Payer: Medicare Other | Admitting: General Practice

## 2019-03-04 DIAGNOSIS — Z7901 Long term (current) use of anticoagulants: Secondary | ICD-10-CM

## 2019-03-04 LAB — POCT INR: INR: 3.7 — AB (ref 2.0–3.0)

## 2019-03-04 NOTE — Patient Instructions (Signed)
Pre visit review using our clinic review tool, if applicable. No additional management support is needed unless otherwise documented below in the visit note.  Hold coumadin today and then continue to take 2 tablets daily except 1 tablet on Wednesdays.   Re-check in 3 weeks.

## 2019-03-14 ENCOUNTER — Other Ambulatory Visit: Payer: Self-pay | Admitting: Endocrinology

## 2019-03-20 ENCOUNTER — Encounter: Payer: Self-pay | Admitting: Internal Medicine

## 2019-03-20 ENCOUNTER — Ambulatory Visit (INDEPENDENT_AMBULATORY_CARE_PROVIDER_SITE_OTHER): Payer: Medicare Other | Admitting: Internal Medicine

## 2019-03-20 ENCOUNTER — Other Ambulatory Visit: Payer: Self-pay

## 2019-03-20 ENCOUNTER — Ambulatory Visit (INDEPENDENT_AMBULATORY_CARE_PROVIDER_SITE_OTHER): Payer: Medicare Other

## 2019-03-20 VITALS — BP 112/60 | HR 65 | Temp 97.7°F | Ht 67.5 in | Wt 228.0 lb

## 2019-03-20 DIAGNOSIS — J4530 Mild persistent asthma, uncomplicated: Secondary | ICD-10-CM

## 2019-03-20 DIAGNOSIS — J3089 Other allergic rhinitis: Secondary | ICD-10-CM

## 2019-03-20 DIAGNOSIS — G4733 Obstructive sleep apnea (adult) (pediatric): Secondary | ICD-10-CM | POA: Diagnosis not present

## 2019-03-20 DIAGNOSIS — J302 Other seasonal allergic rhinitis: Secondary | ICD-10-CM

## 2019-03-20 DIAGNOSIS — Z23 Encounter for immunization: Secondary | ICD-10-CM

## 2019-03-20 NOTE — Patient Instructions (Signed)
Order- flu vax senior  Order CXR dx Asthma, mild persistent  Please call if we can help

## 2019-03-20 NOTE — Assessment & Plan Note (Signed)
She says she sleeps fine if back isn't bothering her. WTW on HST for now.  Plan- weight loss, sleep off flat of back

## 2019-03-20 NOTE — Assessment & Plan Note (Signed)
Using Flonase, saline. Avoids antihistamines due to over-drying of eyes.

## 2019-03-20 NOTE — Progress Notes (Signed)
Subjective:    Patient ID: Kelli Martinez, female    DOB: 08-10-1945, 73 y.o.   MRN: [id number]  HPI F former smoker followed for OSA/ quit CPAP, allergic rhinitis,asthma,  hx pulm embolism/DVT/filter/coumadin(Dr Dwyane Dee), hx R breast Ca/ mastectomy/ xrt/chem/bonemarrow transplant at [facility], hx gastric bypass, DM 2 Office Spirometry 04/27/2015-WNL-FEV1/FVC 0.81, FEV1 2.50/110%  ----------------------------------------------------------------------------------------   09/04/2018- 73 yoF former smoker followed for OSA/ quit CPAP, insomnia, allergic rhinitis,asthma,  hx pulm embolism/DVT/filter/coumadin(Dr Dwyane Dee), hx R breast Ca/ mastectomy/ xrt/chem/bonemarrow transplant at [facility], hx gastric bypass, DM 2 -----nasal congestion - prod cough (clear)  Pro-air HFA, Flonase, Advair 100, In recent weeks has had itching of eyes and nose, sneezing, cough with scant clear mucus.  Using Flonase.  Uses rescue inhaler at least a couple of times per week.  Does wheeze sometimes at night.  She agreed to try Trelegy but is reluctant to leave her familiar Advair. Insomnia with difficulty initiating and maintaining sleep.  Has awakened choking/snoring and agrees to update with home sleep test.  I do not find report of remote sleep study in our EMR.  03/20/2019- 73 yoF former smoker followed for OSA/ quit CPAP, insomnia, allergic rhinitis,asthma,  hx pulm embolism/DVT/filter/coumadin(Dr Dwyane Dee), hx R breast Ca/ mastectomy/ xrt/chem/bonemarrow transplant at [facility], hx gastric bypass, DM 2, Chronic peripheral Edema HST was ordered but not done after last ov due to Covid restrictions.Marland Kitchen -----pt states her breathing varies, currently taking Advair Diskus daily; Home Sleep Test order in Feb, cancelled d/t COVID, pt states she's not sleeping well at night d/t spinal pain Body weight today 228 lbs Trelegy tried- didn't help much> back to Advair 100, ProAir,   Lives alone. Feels back pain is major sleep disrupter and denies  daytime sleepiness is problem. Not currently anticipating surgery. Rhinitis managed with flonase and nasal saline. Dry eyes don't tolerate antihistamine.  Review of Systems- see HPI + = positive Constitutional:   No-   weight loss, night sweats, fevers, chills, fatigue, lassitude. HEENT:   No-  headaches, difficulty swallowing, tooth/dental problems, sore throat,   +dry mouth      No-  sneezing, itching, ear ache,  +nasal congestion, +post nasal drip, +blind R eye CV:  No-   chest pain, orthopnea, PND, swelling in lower extremities, anasarca, dizziness, palpitations Resp: No-   shortness of breath with exertion or at rest.              No-   productive cough,   non-productive cough,  No-  coughing up of blood.              No-   change in color of mucus.   Skin: No-   rash or lesions. GI:  No-   heartburn, indigestion, abdominal pain, nausea, vomiting,  GU:  MS:  No-   joint pain,    swelling.   Neuro- : +HPI Psych:  No- change in mood or affect. No depression or anxiety.  No memory loss.   Objective:   Physical Exam General- Alert, Oriented, Affect-appropriate, Distress- none acute  + obese Skin- rash-none, lesions- none, excoriation- none. XRT skin changes right lateral neck Lymphadenopathy- none Head- atraumatic            Eyes- +blind R eye,  clear conjunctivae            Ears- Hearing, canals, TMs normal            Nose- Clear, no- Septal dev, + sticky mucus, polyps, erosion, perforation  Throat- Mallampati III , mucosa clear , drainage- none, tonsils- atrophic, missing teeth Neck- flexible , trachea midline, no stridor , thyroid nl, carotid no bruit Chest - symmetrical excursion , unlabored           Heart/CV- RRR , no murmur , no gallop  , no rub, nl s1 s2                           - JVD- none , edema+L>R, stasis changes- none, varices- none           Lung- clear to P&A, wheeze+, cough- none , dullness-none, rub- none           Chest wall- + right mastectomy Abd-   Br/ Gen/ Rectal- Not done, not indicated Extrem- cyanosis- none, clubbing- none, atrophy- none, strength- nl   Heavy legs. + cane Neuro- grossly intact to observation  Assessment & Plan:

## 2019-03-20 NOTE — Assessment & Plan Note (Signed)
Feels adequate control with Advair. Infrequent need for rescue inhaler

## 2019-03-21 ENCOUNTER — Other Ambulatory Visit: Payer: Self-pay

## 2019-03-21 ENCOUNTER — Ambulatory Visit (INDEPENDENT_AMBULATORY_CARE_PROVIDER_SITE_OTHER): Payer: Medicare Other | Admitting: General Practice

## 2019-03-21 DIAGNOSIS — Z7901 Long term (current) use of anticoagulants: Secondary | ICD-10-CM | POA: Diagnosis not present

## 2019-03-21 LAB — POCT INR: INR: 2 (ref 2.0–3.0)

## 2019-03-21 NOTE — Patient Instructions (Addendum)
Pre visit review using our clinic review tool, if applicable. No additional management support is needed unless otherwise documented below in the visit note.  Continue to take 2 tablets daily except 1 tablet on Wednesdays.  Re-check in 4 weeks.    

## 2019-04-14 ENCOUNTER — Telehealth: Payer: Self-pay | Admitting: Endocrinology

## 2019-04-14 ENCOUNTER — Other Ambulatory Visit: Payer: Self-pay

## 2019-04-14 MED ORDER — METFORMIN HCL 500 MG PO TABS: ORAL_TABLET | ORAL | 0 refills | Status: DC

## 2019-04-14 NOTE — Telephone Encounter (Signed)
Patient called and advised that she is supposed to be taking 2 500 MG Metformin at bedtime and her prescription is coming with a quantity for 1 at night and with instructions to only take 1 at night.  Pharmacy [facility]

## 2019-04-14 NOTE — Telephone Encounter (Signed)
Rx sent 

## 2019-04-14 NOTE — Telephone Encounter (Signed)
Please advise 

## 2019-04-14 NOTE — Telephone Encounter (Signed)
I have not aware that she is taking 2 tablets metformin 500 mg, we can change the prescription to reflect 2 tablets at bedtime

## 2019-04-15 ENCOUNTER — Other Ambulatory Visit: Payer: Self-pay | Admitting: Endocrinology

## 2019-04-18 ENCOUNTER — Other Ambulatory Visit: Payer: Self-pay

## 2019-04-18 ENCOUNTER — Ambulatory Visit (INDEPENDENT_AMBULATORY_CARE_PROVIDER_SITE_OTHER): Payer: Medicare Other | Admitting: General Practice

## 2019-04-18 DIAGNOSIS — Z7901 Long term (current) use of anticoagulants: Secondary | ICD-10-CM | POA: Diagnosis not present

## 2019-04-18 LAB — POCT INR: INR: 2.6 (ref 2.0–3.0)

## 2019-04-18 NOTE — Patient Instructions (Addendum)
Pre visit review using our clinic review tool, if applicable. No additional management support is needed unless otherwise documented below in the visit note.  Continue to take 2 tablets daily except 1 tablet on Wednesdays.  Re-check in 4 weeks.    

## 2019-04-18 NOTE — Progress Notes (Signed)
Medical screening examination/treatment/procedure(s) were performed by non-physician practitioner and as supervising physician I was immediately available for consultation/collaboration. I agree with above. James John, MD   

## 2019-04-29 ENCOUNTER — Other Ambulatory Visit: Payer: Self-pay | Admitting: Endocrinology

## 2019-04-29 ENCOUNTER — Telehealth: Payer: Self-pay | Admitting: *Deleted

## 2019-04-29 NOTE — Telephone Encounter (Signed)
Received a call from Dr. Benito Mccreedy office.  Patient is in chair needing an extraction of a tooth but is on warfarin for PE's. Patient last dose was taken last night and they would like permission to extract.   Clinic RN contacted Dr. Volanda Napoleon. Per Dr. Volanda Napoleon patient needs to reschedule and needs to understand the risk of not taking the medication for these five days(pateint will be at risk for PEs). Relayed this information to the office they verbalized understanding and will inform patient.

## 2019-04-30 ENCOUNTER — Telehealth: Payer: Self-pay

## 2019-04-30 ENCOUNTER — Other Ambulatory Visit: Payer: Self-pay

## 2019-04-30 MED ORDER — BACLOFEN 20 MG PO TABS: 20.0000 mg | ORAL_TABLET | Freq: Every day | ORAL | 1 refills | Status: DC

## 2019-04-30 NOTE — Telephone Encounter (Signed)
Please confirm correct dosage. There are 2 Rx's on her medication list for Baclofen

## 2019-04-30 NOTE — Telephone Encounter (Signed)
Please refill the most recent prescription that was sent

## 2019-04-30 NOTE — Telephone Encounter (Signed)
Received notification from [facility] re: refill of Baclofen. Message being routed to Dr. Dwyane Dee to determine if this refill request is appropriate for him to refill.

## 2019-04-30 NOTE — Telephone Encounter (Signed)
Please refill.

## 2019-04-30 NOTE — Telephone Encounter (Signed)
baclofen (LIORESAL) 20 MG tablet 90 tablet 1 04/30/2019    Sig - Route: Take 1 tablet (20 mg total) by mouth daily. - Oral   Sent to pharmacy as: baclofen (LIORESAL) 20 MG tablet   E-Prescribing Status: Receipt confirmed by pharmacy (04/30/2019 12:01 PM EDT)

## 2019-05-16 ENCOUNTER — Other Ambulatory Visit: Payer: Self-pay

## 2019-05-16 ENCOUNTER — Ambulatory Visit (INDEPENDENT_AMBULATORY_CARE_PROVIDER_SITE_OTHER): Payer: Medicare Other | Admitting: General Practice

## 2019-05-16 DIAGNOSIS — Z7901 Long term (current) use of anticoagulants: Secondary | ICD-10-CM

## 2019-05-16 LAB — POCT INR: INR: 2.2 (ref 2.0–3.0)

## 2019-05-16 NOTE — Patient Instructions (Addendum)
Pre visit review using our clinic review tool, if applicable. No additional management support is needed unless otherwise documented below in the visit note.  Continue to take 2 tablets daily except 1 tablet on Wednesdays.  Re-check in 4 weeks.    

## 2019-05-16 NOTE — Progress Notes (Signed)
Medical screening examination/treatment/procedure(s) were performed by non-physician practitioner and as supervising physician I was immediately available for consultation/collaboration. I agree with above. Nefertari Rebman, MD   

## 2019-06-16 ENCOUNTER — Other Ambulatory Visit (INDEPENDENT_AMBULATORY_CARE_PROVIDER_SITE_OTHER): Payer: Medicare Other

## 2019-06-16 DIAGNOSIS — E039 Hypothyroidism, unspecified: Secondary | ICD-10-CM | POA: Diagnosis not present

## 2019-06-16 DIAGNOSIS — E1142 Type 2 diabetes mellitus with diabetic polyneuropathy: Secondary | ICD-10-CM

## 2019-06-16 LAB — COMPREHENSIVE METABOLIC PANEL
ALT: 24 U/L (ref 0–35)
AST: 23 U/L (ref 0–37)
Albumin: 4.2 g/dL (ref 3.5–5.2)
Alkaline Phosphatase: 91 U/L (ref 39–117)
BUN: 19 mg/dL (ref 6–23)
CO2: 29 mEq/L (ref 19–32)
Calcium: 9.9 mg/dL (ref 8.4–10.5)
Chloride: 97 mEq/L (ref 96–112)
Creatinine, Ser: 1.08 mg/dL (ref 0.40–1.20)
GFR: 60.42 mL/min (ref >60.00)
Glucose, Bld: 104 mg/dL — ABNORMAL HIGH (ref 70–99)
Potassium: 4.6 mEq/L (ref 3.5–5.1)
Sodium: 133 mEq/L — ABNORMAL LOW (ref 135–145)
Total Bilirubin: 0.4 mg/dL (ref 0.2–1.2)
Total Protein: 7.4 g/dL (ref 6.0–8.3)

## 2019-06-16 LAB — T4, FREE: Free T4: 1.1 ng/dL (ref 0.60–1.60)

## 2019-06-16 LAB — LIPID PANEL
Cholesterol: 171 mg/dL (ref 0–200)
HDL: 81.1 mg/dL (ref >39.00)
LDL Cholesterol: 77 mg/dL (ref 0–99)
NonHDL: 89.76
Total CHOL/HDL Ratio: 2
Triglycerides: 64 mg/dL (ref 0.0–149.0)
VLDL: 12.8 mg/dL (ref 0.0–40.0)

## 2019-06-16 LAB — TSH: TSH: 0.68 u[IU]/mL (ref 0.35–4.50)

## 2019-06-16 LAB — HEMOGLOBIN A1C: Hgb A1c MFr Bld: 6.6 % — ABNORMAL HIGH (ref 4.6–6.5)

## 2019-06-19 ENCOUNTER — Encounter: Payer: Self-pay | Admitting: Endocrinology

## 2019-06-19 ENCOUNTER — Ambulatory Visit (INDEPENDENT_AMBULATORY_CARE_PROVIDER_SITE_OTHER): Payer: Medicare Other | Admitting: Endocrinology

## 2019-06-19 VITALS — BP 118/58 | HR 64 | Ht 67.5 in | Wt 218.8 lb

## 2019-06-19 DIAGNOSIS — Z7901 Long term (current) use of anticoagulants: Secondary | ICD-10-CM | POA: Diagnosis not present

## 2019-06-19 DIAGNOSIS — Z23 Encounter for immunization: Secondary | ICD-10-CM | POA: Diagnosis not present

## 2019-06-19 DIAGNOSIS — R600 Localized edema: Secondary | ICD-10-CM

## 2019-06-19 DIAGNOSIS — E039 Hypothyroidism, unspecified: Secondary | ICD-10-CM

## 2019-06-19 DIAGNOSIS — E7800 Pure hypercholesterolemia, unspecified: Secondary | ICD-10-CM

## 2019-06-19 DIAGNOSIS — K912 Postsurgical malabsorption, not elsewhere classified: Secondary | ICD-10-CM | POA: Diagnosis not present

## 2019-06-19 DIAGNOSIS — E1142 Type 2 diabetes mellitus with diabetic polyneuropathy: Secondary | ICD-10-CM | POA: Diagnosis not present

## 2019-06-19 LAB — GLUCOSE, POCT (MANUAL RESULT ENTRY): POC Glucose: 94 mg/dl (ref 70–99)

## 2019-06-19 NOTE — Patient Instructions (Addendum)
Thyroid pill 125ug  leave off Sat, Sun  Lasix 40mg  daily  Need wellness check

## 2019-06-19 NOTE — Progress Notes (Signed)
Patient ID: Kelli Martinez, female   DOB: 1946-05-17, 73 y.o.   MRN: [id number]    Chief complaint: Followup of various issues     History of Present Illness:  The patient is seen for the following problems:  1.  Postprandial hypoglycemia related to dumping syndrome   She has had postprandial hypoglycemia starting a couple of years after her gastric bypass surgery Her symptoms of blood sugars are mostly a feeling of significant weakness, some shakiness.  She will use glucose tablets or juice in order to relieve the symptoms, recently she thinks that 1 or 2 glucose tablets are usually adequate She has been to the dietitian and has been instructed on balanced low-fat meals with enough protein consistently and restricting carbohydrates including fruits   Recent history:  She has been treated with acarbose 25 mg with each meal  She did try verapamil previously but this did not help.  She is taking her Precose when she starts eating, 1 tablet at breakfast 2 at lunch and 1 at dinner  She does not think she has had any significant low sugars recently Once had a low sugar before lunch from a delayed meal and does not remember the sugar reading She tries to eat a protein with every meal but usually only yogurt in the morning and no snacks Appears to be eating more sandwiches now  She also has lost weight   2.  SWELLING of the legs:  She has had significant problems with her legs chronically  Most of the leg swelling is at the end of the day Now taking Aldactone 150 mg total per day and she is using 100 mg dose and 50 mg dose daily for this in 2 separate prescriptions  Also taking 20 mg Lasix daily, previously was on 40 mg without any excessive cramping  Again she thinks her swelling is again increasing and more on the left side She has difficulty wearing shoes As before she does not like to wear compression stockings because of discomfort and difficulty putting  them on, swelling appears to be present in the morning also  Renal function and potassium are normal  Lab Results  Component Value Date   K 4.6 06/16/2019   Lab Results  Component Value Date   CREATININE 1.08 06/16/2019   CREATININE 0.94 02/17/2019   CREATININE 0.93 11/18/2018     PROBLEM 3:  DIABETES:  This has been mild and well controlled since her gastric bypass surgery  The A1c usually higher than expected and now 6.6 compared to 6.9  Current management, blood sugar patterns:  She did not bring her monitor for download  She thinks her blood sugars in the mornings are just below 100  Not checking after meals month  Although she is losing weight from decreased portions and snacks she is not able to do much exercise because of back pain  Lab glucose 104 and occasionally higher in the morning if she eats during the night  Hypoglycemia: See above  She is taking METFORMIN 500 mg at bedtime for fasting hyperglycemia  Also taking Precose at each meal also to prevent reactive hypoglycemia as discussed above    Wt Readings from Last 3 Encounters:  06/19/19 218 lb 12.8 oz (99.2 kg)  03/20/19 228 lb (103.4 kg)  10/02/18 233 lb (105.7 kg)    Lab Results  Component Value Date   HGBA1C 6.6 (H) 06/16/2019   HGBA1C 6.9 (H) 02/17/2019   HGBA1C 6.7 (H)  11/18/2018   Lab Results  Component Value Date   MICROALBUR <0.7 02/17/2019   LDLCALC 77 06/16/2019   CREATININE 1.08 06/16/2019      Other active problems: see review of systems      Allergies as of 06/19/2019      Reactions   Adhesive [tape] Rash      Medication List       Accurate as of June 19, 2019  3:34 PM. If you have any questions, ask your nurse or doctor.        acarbose 25 MG tablet Commonly known as: PRECOSE Take 25 mg by mouth 3 (three) times daily with meals. Take 1 tablet by mouth at breakfast and lunch and 2 tablets at dinner. What changed: Another medication with the same name was  removed. Continue taking this medication, and follow the directions you see here. Changed by: Elayne Snare, MD   albuterol 108 (90 Base) MCG/ACT inhaler Commonly known as: ProAir HFA USE 2 INHALATIONS FOUR TIMES A DAY AS NEEDED ( AS NEEDED RESCUE INHALER)   baclofen 20 MG tablet Commonly known as: LIORESAL Take 1 tablet (20 mg total) by mouth daily.   Caltrate 600+D 600-400 MG-UNIT tablet Generic drug: Calcium Carbonate-Vitamin D Take 1 tablet by mouth daily.   clobetasol cream 0.05 % Commonly known as: TEMOVATE APPLY 1 APPLICATION TOPICALLY TWICE A DAY   diazepam 10 MG Gel Commonly known as: DIASTAT ACUDIAL Place 5 mg rectally once as needed (cramping).   ferrous sulfate 325 (65 FE) MG tablet Take 325 mg by mouth daily.   fluticasone 50 MCG/ACT nasal spray Commonly known as: FLONASE Place 2 sprays into both nostrils daily.   Fluticasone-Salmeterol 100-50 MCG/DOSE Aepb Commonly known as: Advair Diskus USE 1 INHALATION EVERY 12 HOURS, RINSE MOUTH   FreeStyle Freedom Lite w/Device Kit Use as directed to check blood sugar once daily.   freestyle lancets USE AS INSTRUCTED TO CHECK BLOOD SUGAR ONCE DAILY   FREESTYLE LITE test strip Generic drug: glucose blood USE AS INSTRUCTED TO CHECK BLOOD SUGAR ONCE DAILY   furosemide 20 MG tablet Commonly known as: LASIX TAKE 1 TABLET DAILY   gabapentin 300 MG capsule Commonly known as: NEURONTIN TAKE 2 CAPSULES THREE TIMES A DAY   HYDROcodone-acetaminophen 5-325 MG tablet Commonly known as: Norco Take 1 tablet by mouth 2 (two) times daily as needed for moderate pain.   levothyroxine 125 MCG tablet Commonly known as: SYNTHROID TAKE 1 TABLET DAILY BEFORE BREAKFAST   metFORMIN 500 MG tablet Commonly known as: GLUCOPHAGE Take 2 tablets by mouth once daily at bedtime.   methylcellulose 1 % ophthalmic solution Commonly known as: ARTIFICIAL TEARS Place 1 drop into both eyes as needed. Dry eyes   omeprazole 20 MG  capsule Commonly known as: PRILOSEC TAKE 1 CAPSULE DAILY   pravastatin 40 MG tablet Commonly known as: PRAVACHOL TAKE 1 TABLET DAILY   SLOW-MAG PO Take 500 mg by mouth.   spironolactone 100 MG tablet Commonly known as: ALDACTONE Take 1 tablet (100 mg total) by mouth daily. Take 135m by mouth every other day alternating 2065m   spironolactone 50 MG tablet Commonly known as: ALDACTONE TAKE 1 TABLET TWICE DAILY ALTERNATING DAYS WITH 1 TABLET DAILY.   temazepam 15 MG capsule Commonly known as: RESTORIL 1-2 for sleep as needed   vitamin B-12 500 MCG tablet Commonly known as: CYANOCOBALAMIN Take 500 mcg by mouth daily.   Vitamin D (Ergocalciferol) 1.25 MG (50000 UT) Caps capsule Commonly known as:  DRISDOL TAKE 1 CAPSULE EVERY FRIDAY   warfarin 5 MG tablet Commonly known as: COUMADIN Take as directed by the anticoagulation clinic. If you are unsure how to take this medication, talk to your nurse or doctor. Original instructions: TAKE 2 TABLETS DAILY EXCEPT TAKE 3 TABLETS ON MONDAYS ONLY OR AS DIRECTED BY ANTICOAGULATION CLINIC What changed:   how much to take  how to take this  when to take this  additional instructions       Allergies:  Allergies  Allergen Reactions   Adhesive [Tape] Rash    Past Medical History:  Diagnosis Date   Allergy    Anemia    Arthritis    Asthma    Back pain, chronic    GETS INJECTIONS IN BACK   Blind right eye    hemorrhage   Blood transfusion    Cancer ([place])    breast 1994   Clotting disorder ([place])    Diabetes mellitus without complication ([place])    takes precose   Elevated liver enzymes    Gallstones    GERD (gastroesophageal reflux disease)    HX: breast cancer    Hyperlipidemia    Hypertension    Hypothyroid    Lymphedema of arm    RT   Morbid obesity ([facility])    OSA (obstructive sleep apnea)    has not used in 2 years-lost weight   Osteoporosis    Peripheral neuropathy    on gabapentin    PONV (postoperative nausea and vomiting)    Psoriasis    Pulmonary embolism ([place]) 1998 / 1994 /1968   Syncope and collapse 2011   due to anemia    Past Surgical History:  Procedure Laterality Date   ABDOMINAL HYSTERECTOMY     BONE MARROW TRANSPLANT  1994   CARDIOVASCULAR STRESS TEST  05/23/2005   EF 53%   carpal tunnell     bil   cataracts     CHOLECYSTECTOMY  05/10/2012   Procedure: LAPAROSCOPIC CHOLECYSTECTOMY WITH INTRAOPERATIVE CHOLANGIOGRAM;  Surgeon: Pedro Earls, MD;  Location: [facility];  Service: General;  Laterality: N/A;  Laparoscopic Cholecystectomy with Intraoperative Cholangiogram   GASTRIC BYPASS  2011   bariatric surgery   HIATAL HERNIA REPAIR     IVC filter     recurrent DVT   KNEE ARTHROTOMY  1998   MASS EXCISION Right 11/24/2016   Procedure: EXCISION RIGHT AXILLARY LESION AND CHEST WALL LESION;  Surgeon: Johnathan Hausen, MD;  Location: [facility];  Service: General;  Laterality: Right;   MASTECTOMY     Right   US ECHOCARDIOGRAPHY  10/27/08   EF 55-60%    Family History  Problem Relation Age of Onset   Sudden death Mother        car accident   Cancer Father 41       lung   Cancer Sister 68       lung cancer   Asthma Daughter     Social History:  reports that she quit smoking about 40 years ago. Her smoking use included cigarettes. She has a 5.00 pack-year smoking history. She has never used smokeless tobacco. She reports that she does not drink alcohol or use drugs.  Review of Systems   ANTICOAGULATION: Her Coumadin has been followed regularly in the clinic INR results as follows  Lab Results  Component Value Date   INR 2.2 05/16/2019   INR 2.6 04/18/2019   INR 2.0 03/21/2019    ABNORMAL liver functions:  She has had these before transiently and no etiology found Usually not medication related  Lab Results  Component Value Date   ALT 24 06/16/2019   ALT 25 01/19/2016     Neuropathy with persistent  burning, mild numbness and lower leg pain:  She gets relief with nortriptyline and gabapentin  MUSCLE cramps/spasms: She is being treated with 10 mg baclofen during the day twice daily and 20 mg at night Followed by neurologist She may have more symptoms in her back pain is worse She also uses tonic water, mustard and magnesium supplements    Lymphedema right arm, secondary to  breast surgery, using  elastic compression stocking with relief   History of mild hypothyroidism: This has been treated with 62.5 mcg However for some reason her prescription was refilled as 1 tablet daily instead of half tablet of the 125 mcg TSH appears to be low normal consistently No fatigue and also no shakiness  Lab Results  Component Value Date   TSH 0.68 06/16/2019   TSH 0.63 11/18/2018   TSH 0.77 08/16/2018   FREET4 1.10 06/16/2019   FREET4 1.26 06/17/2018   FREET4 0.88 01/15/2018     Hypercholesterolemia: LDL has been at target with pravastatin Labs as below  Lipid levels:  Lab Results  Component Value Date   CHOL 171 06/16/2019   HDL 81.10 06/16/2019   LDLCALC 77 06/16/2019   TRIG 64.0 06/16/2019   CHOLHDL 2 06/16/2019   Lab Results  Component Value Date   ALT 24 06/16/2019   ALT 25 01/19/2016    Blood pressure controlled with diuretics only   BP Readings from Last 3 Encounters:  06/19/19 (!) 118/58  03/20/19 112/60  10/02/18 104/60      LABS:  Office Visit on 06/19/2019  Component Date Value Ref Range Status   POC Glucose 06/19/2019 94  70 - 99 mg/dl Final  Lab on 06/16/2019  Component Date Value Ref Range Status   Free T4 06/16/2019 1.10  0.60 - 1.60 ng/dL Final   Comment: Specimens from patients who are undergoing biotin therapy and /or ingesting biotin supplements may contain high levels of biotin.  The higher biotin concentration in these specimens interferes with this Free T4 assay.  Specimens that contain high levels  of biotin may cause false high results  for this Free T4 assay.  Please interpret results in light of the total clinical presentation of the patient.     TSH 06/16/2019 0.68  0.35 - 4.50 uIU/mL Final   Cholesterol 06/16/2019 171  0 - 200 mg/dL Final   ATP III Classification       Desirable:  < 200 mg/dL               Borderline High:  200 - 239 mg/dL          High:  > = 240 mg/dL   Triglycerides 06/16/2019 64.0  0.0 - 149.0 mg/dL Final   Normal:  <150 mg/dLBorderline High:  150 - 199 mg/dL   HDL 06/16/2019 81.10  >39.00 mg/dL Final   VLDL 06/16/2019 12.8  0.0 - 40.0 mg/dL Final   LDL Cholesterol 06/16/2019 77  0 - 99 mg/dL Final   Total CHOL/HDL Ratio 06/16/2019 2   Final                  Men          Women1/2 Average Risk     3.4  3.3Average Risk          5.0          4.42X Average Risk          9.6          7.13X Average Risk          15.0          11.0                       NonHDL 06/16/2019 89.76   Final   NOTE:  Non-HDL goal should be 30 mg/dL higher than patient's LDL goal (i.e. LDL goal of < 70 mg/dL, would have non-HDL goal of < 100 mg/dL)   Sodium 06/16/2019 133* 135 - 145 mEq/L Final   Potassium 06/16/2019 4.6  3.5 - 5.1 mEq/L Final   Chloride 06/16/2019 97  96 - 112 mEq/L Final   CO2 06/16/2019 29  19 - 32 mEq/L Final   Glucose, Bld 06/16/2019 104* 70 - 99 mg/dL Final   BUN 06/16/2019 19  6 - 23 mg/dL Final   Creatinine, Ser 06/16/2019 1.08  0.40 - 1.20 mg/dL Final   Total Bilirubin 06/16/2019 0.4  0.2 - 1.2 mg/dL Final   Alkaline Phosphatase 06/16/2019 91  39 - 117 U/L Final   AST 06/16/2019 23  0 - 37 U/L Final   ALT 06/16/2019 24  0 - 35 U/L Final   Total Protein 06/16/2019 7.4  6.0 - 8.3 g/dL Final   Albumin 06/16/2019 4.2  3.5 - 5.2 g/dL Final   GFR 06/16/2019 60.42  >60.00 mL/min Final   Calcium 06/16/2019 9.9  8.4 - 10.5 mg/dL Final   Hgb A1c MFr Bld 06/16/2019 6.6* 4.6 - 6.5 % Final   Glycemic Control Guidelines for People with Diabetes:Non Diabetic:  <6%Goal of Therapy:  <7%Additional Action Suggested:  >8%      EXAM:  BP (!) 118/58 (BP Location: Left Arm, Patient Position: Sitting, Cuff Size: Large)    Pulse 64    Ht 5' 7.5" (1.715 m)    Wt 218 lb 12.8 oz (99.2 kg)    SpO2 97%    BMI 33.76 kg/m   She has generalized swelling of her legs especially right with some stasis changes   Assessment/Plan:   1.  EDEMA: This is still persistent and she thinks this is worse She is on iron 50 mg of Aldactone and usually taking 20 mg Lasix Since she cannot use elastic stockings for her dependent edema will need to be managed with diuretics only  She will go up to 40 mg of Lasix daily    2. Reactive hypoglycemia secondary to gastrointestinal surgery: Symptoms have been less frequent now  Reminded her to have protein with every meal and to take her acarbose at the start of each meal  3. Mild diabetes: A1c 6.6 and improving She has lost weight Doing well on diet and reducing portions Needs to check more readings after meals higher She will continue metformin 500 at bedtime   4.   Muscle cramps: Controlled with baclofen and magnesium supplements Continue follow-up with neurologist  7.    LIPIDS: Well controlled on pravastatin 40 mg daily  8.  Primary hypothyroidism: For some reason she is taking a whole tablet of 125 mcg instead of the half However her TSH is still normal although on the low end  Does not need to have her TSH low normal and considering her recent weight  loss will reduce her medication to 5 days a week  Follow-up in 2 months  9.  Preventive care: She has not had a Pneumovax booster and will do so  She needs to see her PCP for wellness care  Total visit time for evaluation and management of multiple problems and counseling =25 minutes   Patient Instructions  Thyroid pill 125ug  leave off Sat, Sun  Lasix 67m daily  Need wellness check   AElayne Snare11/19/2020, 3:34 PM

## 2019-06-20 ENCOUNTER — Other Ambulatory Visit: Payer: Self-pay

## 2019-06-20 ENCOUNTER — Ambulatory Visit (INDEPENDENT_AMBULATORY_CARE_PROVIDER_SITE_OTHER): Payer: Medicare Other | Admitting: General Practice

## 2019-06-20 DIAGNOSIS — Z7901 Long term (current) use of anticoagulants: Secondary | ICD-10-CM

## 2019-06-20 LAB — POCT INR: INR: 2.3 (ref 2.0–3.0)

## 2019-06-20 NOTE — Progress Notes (Signed)
Medical screening examination/treatment/procedure(s) were performed by non-physician practitioner and as supervising physician I was immediately available for consultation/collaboration. I agree with above. Gared Gillie, MD   

## 2019-06-20 NOTE — Patient Instructions (Addendum)
Pre visit review using our clinic review tool, if applicable. No additional management support is needed unless otherwise documented below in the visit note.  Continue to take 2 tablets daily except 1 tablet on Wednesdays.   Re-check in 6 weeks. 

## 2019-06-23 ENCOUNTER — Other Ambulatory Visit: Payer: Medicare Other

## 2019-06-24 ENCOUNTER — Other Ambulatory Visit: Payer: Self-pay | Admitting: Endocrinology

## 2019-06-24 NOTE — Telephone Encounter (Signed)
Noted  

## 2019-06-24 NOTE — Telephone Encounter (Signed)
Needs to be done by PCP

## 2019-06-24 NOTE — Telephone Encounter (Signed)
Per the request of Dr. Dwyane Dee, this refill request is being forwarded to the patient's PCP for authorization to refill.

## 2019-06-24 NOTE — Telephone Encounter (Signed)
Do you want to refill this or refer to PCP?

## 2019-06-25 ENCOUNTER — Other Ambulatory Visit: Payer: Self-pay | Admitting: Endocrinology

## 2019-07-10 ENCOUNTER — Other Ambulatory Visit: Payer: Self-pay | Admitting: Endocrinology

## 2019-07-13 ENCOUNTER — Other Ambulatory Visit: Payer: Self-pay | Admitting: Endocrinology

## 2019-08-05 ENCOUNTER — Encounter (INDEPENDENT_AMBULATORY_CARE_PROVIDER_SITE_OTHER): Payer: Medicare Other | Admitting: Ophthalmology

## 2019-08-05 DIAGNOSIS — H35033 Hypertensive retinopathy, bilateral: Secondary | ICD-10-CM

## 2019-08-05 DIAGNOSIS — H43813 Vitreous degeneration, bilateral: Secondary | ICD-10-CM

## 2019-08-05 DIAGNOSIS — H348322 Tributary (branch) retinal vein occlusion, left eye, stable: Secondary | ICD-10-CM | POA: Diagnosis not present

## 2019-08-05 DIAGNOSIS — H353211 Exudative age-related macular degeneration, right eye, with active choroidal neovascularization: Secondary | ICD-10-CM | POA: Diagnosis not present

## 2019-08-05 DIAGNOSIS — H353122 Nonexudative age-related macular degeneration, left eye, intermediate dry stage: Secondary | ICD-10-CM | POA: Diagnosis not present

## 2019-08-05 DIAGNOSIS — I10 Essential (primary) hypertension: Secondary | ICD-10-CM | POA: Diagnosis not present

## 2019-08-08 ENCOUNTER — Other Ambulatory Visit: Payer: Self-pay | Admitting: Endocrinology

## 2019-08-08 ENCOUNTER — Ambulatory Visit: Payer: Medicare Other

## 2019-08-14 ENCOUNTER — Other Ambulatory Visit: Payer: Self-pay

## 2019-08-14 ENCOUNTER — Other Ambulatory Visit (INDEPENDENT_AMBULATORY_CARE_PROVIDER_SITE_OTHER): Payer: Medicare Other

## 2019-08-14 DIAGNOSIS — E039 Hypothyroidism, unspecified: Secondary | ICD-10-CM

## 2019-08-14 DIAGNOSIS — E1142 Type 2 diabetes mellitus with diabetic polyneuropathy: Secondary | ICD-10-CM

## 2019-08-14 LAB — BASIC METABOLIC PANEL
BUN: 17 mg/dL (ref 6–23)
CO2: 29 mEq/L (ref 19–32)
Calcium: 9.7 mg/dL (ref 8.4–10.5)
Chloride: 101 mEq/L (ref 96–112)
Creatinine, Ser: 1 mg/dL (ref 0.40–1.20)
GFR: 66.01 mL/min (ref >60.00)
Glucose, Bld: 100 mg/dL — ABNORMAL HIGH (ref 70–99)
Potassium: 4.4 mEq/L (ref 3.5–5.1)
Sodium: 137 mEq/L (ref 135–145)

## 2019-08-14 LAB — T4, FREE: Free T4: 1.12 ng/dL (ref 0.60–1.60)

## 2019-08-14 LAB — TSH: TSH: 2.91 u[IU]/mL (ref 0.35–4.50)

## 2019-08-15 ENCOUNTER — Ambulatory Visit (INDEPENDENT_AMBULATORY_CARE_PROVIDER_SITE_OTHER): Payer: Medicare Other | Admitting: General Practice

## 2019-08-15 DIAGNOSIS — Z7901 Long term (current) use of anticoagulants: Secondary | ICD-10-CM

## 2019-08-15 LAB — POCT INR: INR: 2.2 (ref 2.0–3.0)

## 2019-08-15 NOTE — Progress Notes (Signed)
Medical screening examination/treatment/procedure(s) were performed by non-physician practitioner and as supervising physician I was immediately available for consultation/collaboration. I agree with above. Gorgeous Newlun, MD   

## 2019-08-15 NOTE — Patient Instructions (Addendum)
.  [facility]  Continue to take 2 tablets daily except 1 tablet on Wednesdays.   Re-check in 6 weeks.

## 2019-08-17 ENCOUNTER — Other Ambulatory Visit: Payer: Self-pay | Admitting: Internal Medicine

## 2019-08-19 ENCOUNTER — Other Ambulatory Visit: Payer: Self-pay

## 2019-08-19 ENCOUNTER — Ambulatory Visit (INDEPENDENT_AMBULATORY_CARE_PROVIDER_SITE_OTHER): Payer: Medicare Other | Admitting: Endocrinology

## 2019-08-19 ENCOUNTER — Encounter: Payer: Self-pay | Admitting: Endocrinology

## 2019-08-19 VITALS — BP 124/70 | HR 64 | Ht 67.5 in | Wt 216.2 lb

## 2019-08-19 DIAGNOSIS — E1142 Type 2 diabetes mellitus with diabetic polyneuropathy: Secondary | ICD-10-CM | POA: Diagnosis not present

## 2019-08-19 DIAGNOSIS — E559 Vitamin D deficiency, unspecified: Secondary | ICD-10-CM

## 2019-08-19 DIAGNOSIS — I872 Venous insufficiency (chronic) (peripheral): Secondary | ICD-10-CM | POA: Diagnosis not present

## 2019-08-19 DIAGNOSIS — K912 Postsurgical malabsorption, not elsewhere classified: Secondary | ICD-10-CM | POA: Diagnosis not present

## 2019-08-19 DIAGNOSIS — E039 Hypothyroidism, unspecified: Secondary | ICD-10-CM

## 2019-08-19 DIAGNOSIS — E7800 Pure hypercholesterolemia, unspecified: Secondary | ICD-10-CM

## 2019-08-19 NOTE — Progress Notes (Signed)
Patient ID: Kelli Martinez, female   DOB: Dec 18, 1945, 74 y.o.   MRN: [id number]    Chief complaint: Followup of various chronic problems    History of Present Illness:  The patient is seen for the following problems:  1.  Postprandial hypoglycemia related to dumping syndrome   She has had postprandial hypoglycemia starting a couple of years after her gastric bypass surgery Her symptoms of blood sugars are mostly a feeling of significant weakness, some shakiness.  She will use glucose tablets or juice in order to relieve the symptoms, recently she thinks that 1 or 2 glucose tablets are usually adequate She has been to the dietitian and has been instructed on balanced low-fat meals with enough protein consistently and restricting carbohydrates including fruits   Recent history:  She has been treated with acarbose 25 mg with each meal  She did try verapamil previously but this did not help.  She is taking her Precose as prescribed when she starts eating, 1 tablet at breakfast 2 at lunch and 1 at dinner  Has had only rare symptoms of low blood sugars She may tend to have symptoms of low sugars mostly when she is late for meals This is usually not a problem and symptoms usually resolve with a snack like yogurt  Once had a low sugar before dinner and blood sugar was 66 last month She is usually trying to get a protein with most meals   2.  SWELLING of the legs:  She has had significant problems with her legs and feet chronically  Swelling increases at the end of the day She continues to be taking Aldactone 150 mg total per day and she is using 100 mg dose and 50 mg dose daily for this in 2 separate prescriptions  Also taking 20 mg Lasix daily, alternating with 40 mg, gets cramping with higher doses She likes to take her medication in the early afternoon since most mornings she may be going out for appointments  Periodically has more swelling but less recently   As before she does not like to wear compression stockings because of discomfort and difficulty putting them on, swelling appears to be present in the morning also  Renal function and potassium are normal  Lab Results  Component Value Date   K 4.4 08/14/2019   Lab Results  Component Value Date   CREATININE 1.00 08/14/2019   CREATININE 1.08 06/16/2019   CREATININE 0.94 02/17/2019     PROBLEM 3:  DIABETES:  This has been mild and well controlled since her gastric bypass surgery  The A1c usually higher than expected and last 6.6 compared to 6.9  Current management, blood sugar patterns:  She did bring her monitor for download  Keeping portions small and weight is down another 2 pounds  No side effects from Metformin  Morning sugars are mostly checked at home regularly but in the last week they are normal  She is taking METFORMIN 500 mg at bedtime for fasting hyperglycemia Also taking Precose at each meal also to prevent reactive hypoglycemia as discussed above  Glucose readings: FASTING range 73-94 Afternoon/suppertime 66-115 with only 1 reading over 100    Wt Readings from Last 3 Encounters:  08/19/19 216 lb 3.2 oz (98.1 kg)  06/19/19 218 lb 12.8 oz (99.2 kg)  03/20/19 228 lb (103.4 kg)    Lab Results  Component Value Date   HGBA1C 6.6 (H) 06/16/2019   HGBA1C 6.9 (H) 02/17/2019   HGBA1C  6.7 (H) 11/18/2018   Lab Results  Component Value Date   MICROALBUR <0.7 02/17/2019   LDLCALC 77 06/16/2019   CREATININE 1.00 08/14/2019      Other active problems: see review of systems      Allergies as of 08/19/2019      Reactions   Adhesive [tape] Rash      Medication List       Accurate as of August 19, 2019 10:41 AM. If you have any questions, ask your nurse or doctor.        acarbose 25 MG tablet Commonly known as: PRECOSE Take 25 mg by mouth 3 (three) times daily with meals. Take 1 tablet by mouth at breakfast and lunch and 2 tablets at dinner.    albuterol 108 (90 Base) MCG/ACT inhaler Commonly known as: ProAir HFA USE 2 INHALATIONS FOUR TIMES A DAY AS NEEDED ( AS NEEDED RESCUE INHALER)   baclofen 20 MG tablet Commonly known as: LIORESAL TAKE 1 TABLET(20 MG) BY MOUTH DAILY   Caltrate 600+D 600-400 MG-UNIT tablet Generic drug: Calcium Carbonate-Vitamin D Take 1 tablet by mouth daily.   clobetasol cream 0.05 % Commonly known as: TEMOVATE APPLY 1 APPLICATION TOPICALLY TWICE A DAY   diazepam 10 MG Gel Commonly known as: DIASTAT ACUDIAL Place 5 mg rectally once as needed (cramping).   ferrous sulfate 325 (65 FE) MG tablet Take 325 mg by mouth daily.   fluticasone 50 MCG/ACT nasal spray Commonly known as: FLONASE Place 2 sprays into both nostrils daily.   Fluticasone-Salmeterol 100-50 MCG/DOSE Aepb Commonly known as: Advair Diskus USE 1 INHALATION EVERY 12 HOURS, RINSE MOUTH   FreeStyle Freedom Lite w/Device Kit Use as directed to check blood sugar once daily.   freestyle lancets USE AS INSTRUCTED TO CHECK BLOOD SUGAR ONCE DAILY   FREESTYLE LITE test strip Generic drug: glucose blood USE AS INSTRUCTED TO CHECK BLOOD SUGAR ONCE DAILY   furosemide 20 MG tablet Commonly known as: LASIX TAKE 1 TABLET DAILY   gabapentin 300 MG capsule Commonly known as: NEURONTIN TAKE 2 CAPSULES THREE TIMES A DAY   HYDROcodone-acetaminophen 5-325 MG tablet Commonly known as: Norco Take 1 tablet by mouth 2 (two) times daily as needed for moderate pain.   levothyroxine 125 MCG tablet Commonly known as: SYNTHROID Take 125 mcg by mouth See admin instructions. Take one tablet by mouth daily Monday-Friday. What changed: Another medication with the same name was removed. Continue taking this medication, and follow the directions you see here. Changed by: Elayne Snare, MD   metFORMIN 500 MG tablet Commonly known as: GLUCOPHAGE TAKE 2 TABLETS ONCE DAILY AT BEDTIME   methylcellulose 1 % ophthalmic solution Commonly known as:  ARTIFICIAL TEARS Place 1 drop into both eyes as needed. Dry eyes   omeprazole 20 MG capsule Commonly known as: PRILOSEC TAKE 1 CAPSULE DAILY   pravastatin 40 MG tablet Commonly known as: PRAVACHOL TAKE 1 TABLET DAILY   SLOW-MAG PO Take 500 mg by mouth.   spironolactone 100 MG tablet Commonly known as: ALDACTONE Take 1 tablet (100 mg total) by mouth daily. Take 141m by mouth every other day alternating 2053m   spironolactone 50 MG tablet Commonly known as: ALDACTONE TAKE 1 TABLET TWICE DAILY ALTERNATING DAYS WITH 1 TABLET DAILY.   temazepam 15 MG capsule Commonly known as: RESTORIL 1-2 for sleep as needed   vitamin B-12 500 MCG tablet Commonly known as: CYANOCOBALAMIN Take 500 mcg by mouth daily.   Vitamin D (Ergocalciferol) 1.25 MG (50000  UNIT) Caps capsule Commonly known as: DRISDOL TAKE 1 CAPSULE EVERY FRIDAY   warfarin 5 MG tablet Commonly known as: COUMADIN Take as directed by the anticoagulation clinic. If you are unsure how to take this medication, talk to your nurse or doctor. Original instructions: Take 2 tablets (10 mg total) by mouth daily. TAKE 2 TABLETS BY MOUTH ONCE DAILY, EXCEPT WEDNESDAYS. ON Wednesday, TAKE 1 TABLET DAILY.       Allergies:  Allergies  Allergen Reactions  . Adhesive [Tape] Rash    Past Medical History:  Diagnosis Date  . Allergy   . Anemia   . Arthritis   . Asthma   . Back pain, chronic    GETS INJECTIONS IN BACK  . Blind right eye    hemorrhage  . Blood transfusion   . Cancer ([place])    breast 1994  . Clotting disorder ([place])   . Diabetes mellitus without complication ([place])    takes precose  . Elevated liver enzymes   . Gallstones   . GERD (gastroesophageal reflux disease)   . HX: breast cancer   . Hyperlipidemia   . Hypertension   . Hypothyroid   . Lymphedema of arm    RT  . Morbid obesity ([place])   . OSA (obstructive sleep apnea)    has not used in 2 years-lost weight  . Osteoporosis   . Peripheral neuropathy     on gabapentin  . PONV (postoperative nausea and vomiting)   . Psoriasis   . Pulmonary embolism ([place]) 1998 / 1994 /1968  . Syncope and collapse 2011   due to anemia    Past Surgical History:  Procedure Laterality Date  . ABDOMINAL HYSTERECTOMY    . BONE MARROW TRANSPLANT  1994  . CARDIOVASCULAR STRESS TEST  05/23/2005   EF 53%  . carpal tunnell     bil  . cataracts    . CHOLECYSTECTOMY  05/10/2012   Procedure: LAPAROSCOPIC CHOLECYSTECTOMY WITH INTRAOPERATIVE CHOLANGIOGRAM;  Surgeon: Pedro Earls, MD;  Location: [facility];  Service: General;  Laterality: N/A;  Laparoscopic Cholecystectomy with Intraoperative Cholangiogram  . GASTRIC BYPASS  2011   bariatric surgery  . HIATAL HERNIA REPAIR    . IVC filter     recurrent DVT  . KNEE ARTHROTOMY  1998  . MASS EXCISION Right 11/24/2016   Procedure: EXCISION RIGHT AXILLARY LESION AND CHEST WALL LESION;  Surgeon: Johnathan Hausen, MD;  Location: [facility];  Service: General;  Laterality: Right;  . MASTECTOMY     Right  . US ECHOCARDIOGRAPHY  10/27/08   EF 55-60%    Family History  Problem Relation Age of Onset  . Sudden death Mother        car accident  . Cancer Father 55       lung  . Cancer Sister 100       lung cancer  . Asthma Daughter     Social History:  reports that she quit smoking about 41 years ago. Her smoking use included cigarettes. She has a 5.00 pack-year smoking history. She has never used smokeless tobacco. She reports that she does not drink alcohol or use drugs.  Review of Systems   ANTICOAGULATION: Her Coumadin has been followed regularly in the clinic INR results as follows  Lab Results  Component Value Date   INR 2.2 08/15/2019   INR 2.3 06/20/2019   INR 2.2 05/16/2019    ABNORMAL liver functions: She has had these before transiently and no  etiology found Usually not medication related  Lab Results  Component Value Date   ALT 24 06/16/2019   ALT 25 01/19/2016      Neuropathy with persistent burning, mild numbness and lower leg pain:  She gets relief with nortriptyline and gabapentin  MUSCLE cramps/spasms: She is being treated with 10 mg baclofen during the day twice daily and 20 mg at night Followed by neurologist She also uses tonic water, mustard and magnesium supplements    Lymphedema right arm, secondary to  breast surgery, using  elastic compression stocking with relief   History of mild hypothyroidism: Has been present for several years and minimally symptomatic usually  On her prescription for levothyroxine it was refilled as 1 tablet daily instead of half tablet of the 125 mcg Even with increasing the dose her TSH was still not suppressed and she was asymptomatic  She was told to take the 125 mcg daily on weekdays only and skip the weekends and she does not complain of fatigue and TSH is mid normal now  Lab Results  Component Value Date   TSH 2.91 08/14/2019   TSH 0.68 06/16/2019   TSH 0.63 11/18/2018   FREET4 1.12 08/14/2019   FREET4 1.10 06/16/2019   FREET4 1.26 06/17/2018     Hypercholesterolemia: LDL has been at target with pravastatin Labs as below  Lipid levels:  Lab Results  Component Value Date   CHOL 171 06/16/2019   HDL 81.10 06/16/2019   LDLCALC 77 06/16/2019   TRIG 64.0 06/16/2019   CHOLHDL 2 06/16/2019   Lab Results  Component Value Date   ALT 24 06/16/2019   ALT 25 01/19/2016    Blood pressure controlled with diuretics only long-term   BP Readings from Last 3 Encounters:  08/19/19 124/70  06/19/19 (!) 118/58  03/20/19 112/60      LABS:  Anti-coag visit on 08/15/2019  Component Date Value Ref Range Status  . INR 08/15/2019 2.2  2.0 - 3.0 Final  Lab on 08/14/2019  Component Date Value Ref Range Status  . Sodium 08/14/2019 137  135 - 145 mEq/L Final  . Potassium 08/14/2019 4.4  3.5 - 5.1 mEq/L Final  . Chloride 08/14/2019 101  96 - 112 mEq/L Final  . CO2 08/14/2019 29  19 - 32 mEq/L  Final  . Glucose, Bld 08/14/2019 100* 70 - 99 mg/dL Final  . BUN 08/14/2019 17  6 - 23 mg/dL Final  . Creatinine, Ser 08/14/2019 1.00  0.40 - 1.20 mg/dL Final  . GFR 08/14/2019 66.01  >60.00 mL/min Final  . Calcium 08/14/2019 9.7  8.4 - 10.5 mg/dL Final  . Free T4 08/14/2019 1.12  0.60 - 1.60 ng/dL Final   Comment: Specimens from patients who are undergoing biotin therapy and /or ingesting biotin supplements may contain high levels of biotin.  The higher biotin concentration in these specimens interferes with this Free T4 assay.  Specimens that contain high levels  of biotin may cause false high results for this Free T4 assay.  Please interpret results in light of the total clinical presentation of the patient.    Marland Kitchen TSH 08/14/2019 2.91  0.35 - 4.50 uIU/mL Final     EXAM:  BP 124/70 (BP Location: Left Arm, Patient Position: Sitting, Cuff Size: Large)   Pulse 64   Ht 5' 7.5" (1.715 m)   Wt 216 lb 3.2 oz (98.1 kg)   BMI 33.36 kg/m   She has some edema of the right foot and mild tense  edema of the lower legs   Assessment/Plan:   1.  EDEMA: This is related to venous insufficiency mostly She is on  Aldactone and also on Lasix alternating 40 mg with taking 20 mg Lasix Since she cannot tolerate 40 mg daily because of cramps she can continue to alternate 20 and 40 which appears to be working a little better Also try to do some leg elevation when able to   2. Reactive hypoglycemia secondary to gastrointestinal surgery: Symptoms have been better controlled  She needs to make sure she does not delay her meals and have protein with every meal  3. Mild diabetes: A1c 6.6 on the last visit She has lost weight over the last few months and not gaining any back  Blood sugars are mostly near normal at home, 100 in the lab fasting She will continue metformin 500 at bedtime   4.   LIPIDS: Well controlled on pravastatin 40 mg daily as before  5.  Primary hypothyroidism: She is taking 125  mcg, 5 days a week and TSH is 2.9 Since she has a large supply of the 125 tablets she will continue the same regimen but let us know when she needs a new refill    Preventive care:   She needs to see her PCP for wellness care which is due  She will try to get her son to help her schedule her for Covid vaccine online and details were given  There are no Patient Instructions on file for this visit.  Elayne Snare 08/19/2019, 10:41 AM

## 2019-08-19 NOTE — Patient Instructions (Signed)
We are committed to keeping you informed about the COVID-19 vaccine.  As the vaccine continues to become available for each phase, we will ensure that patients who meet the criteria receive the information they need to access vaccination opportunities. Continue to check your MyChart account and [email or web address] for updates. Please review the Phase 1b information below.  Following [place]'s guidelines for the distribution of COVID-19 vaccines we are pleased to share our plans to begin offering vaccines to those 65 and older (Phase 1b). Here are details of those plans:  [place] On Tuesday, Jan. 19, the [facility]) and [place] begin large-scale COVID-19 vaccinations at the [place]. The vaccinations are appointment only and for those 62 and older.  Walk-ins will not be accepted.  All appointments are currently filled. Please join our waiting list for the next available appointments. We will contact you when appointments become available. Please do not sign up more than once.  Join Our Waiting List   Other Vaccination Opportunities in [place] We are also working in partnership with county health agencies in our service counties to ensure continuing vaccination availability in the weeks and months ahead. Learn more about each county's vaccination efforts in the website links below:   [place]'s phase 1b vaccination guidelines, prioritizing those 65 and over as the next eligible group to receive the COVID-19 vaccine, are detailed at [email or web address].       For our most current information, please visit [email or web address].

## 2019-09-10 ENCOUNTER — Other Ambulatory Visit: Payer: Self-pay | Admitting: Internal Medicine

## 2019-09-14 ENCOUNTER — Ambulatory Visit: Payer: Medicare Other | Attending: Internal Medicine

## 2019-09-14 DIAGNOSIS — Z23 Encounter for immunization: Secondary | ICD-10-CM | POA: Insufficient documentation

## 2019-09-14 NOTE — Progress Notes (Signed)
   Covid-19 Vaccination Clinic  Name:  Lauranne Turan    MRN: [id number] DOB: 1946/07/02  09/14/2019  Ms. Denman was observed post Covid-19 immunization for 15 minutes without incidence. She was provided with Vaccine Information Sheet and instruction to access the V-Safe system.   Ms. Yao was instructed to call 911 with any severe reactions post vaccine: Marland Kitchen Difficulty breathing  . Swelling of your face and throat  . A fast heartbeat  . A bad rash all over your body  . Dizziness and weakness    Immunizations Administered    Name Date Dose VIS Date Route   Pfizer COVID-19 Vaccine 09/14/2019  1:51 PM 0.3 mL 07/11/2019 Intramuscular   Manufacturer: [place]   Lot: [id number]   [place]: [id number]

## 2019-09-23 ENCOUNTER — Other Ambulatory Visit: Payer: Self-pay

## 2019-09-23 ENCOUNTER — Ambulatory Visit (INDEPENDENT_AMBULATORY_CARE_PROVIDER_SITE_OTHER): Payer: Medicare Other | Admitting: General Practice

## 2019-09-23 ENCOUNTER — Ambulatory Visit (INDEPENDENT_AMBULATORY_CARE_PROVIDER_SITE_OTHER): Payer: Medicare Other | Admitting: Internal Medicine

## 2019-09-23 ENCOUNTER — Encounter: Payer: Self-pay | Admitting: Internal Medicine

## 2019-09-23 VITALS — BP 118/76 | HR 73 | Temp 97.6°F | Ht 67.5 in

## 2019-09-23 DIAGNOSIS — G4733 Obstructive sleep apnea (adult) (pediatric): Secondary | ICD-10-CM

## 2019-09-23 DIAGNOSIS — Z7901 Long term (current) use of anticoagulants: Secondary | ICD-10-CM | POA: Diagnosis not present

## 2019-09-23 DIAGNOSIS — F5101 Primary insomnia: Secondary | ICD-10-CM | POA: Diagnosis not present

## 2019-09-23 DIAGNOSIS — J4530 Mild persistent asthma, uncomplicated: Secondary | ICD-10-CM

## 2019-09-23 DIAGNOSIS — J3089 Other allergic rhinitis: Secondary | ICD-10-CM

## 2019-09-23 DIAGNOSIS — J302 Other seasonal allergic rhinitis: Secondary | ICD-10-CM

## 2019-09-23 LAB — POCT INR: INR: 2.6 (ref 2.0–3.0)

## 2019-09-23 MED ORDER — AZELASTINE HCL 0.1 % NA SOLN: NASAL | 3 refills | Status: DC

## 2019-09-23 MED ORDER — ALBUTEROL SULFATE HFA 108 (90 BASE) MCG/ACT IN AERS: INHALATION_SPRAY | RESPIRATORY_TRACT | 3 refills | Status: DC

## 2019-09-23 NOTE — Assessment & Plan Note (Signed)
Back pain control is somewhat better.  Temazepam works well when needed. Discussed interaction with other meds to avoid over sedation.

## 2019-09-23 NOTE — Patient Instructions (Signed)
Refill sent for albuterol rescue inhaler  Script sent for azelastine nasal antihistamine spray- use when needed for runny nose  Order- schedule HST   Dx OSA  Please call us about 2 weeks after your home sleep test, to see if results and recommendations are ready yet.

## 2019-09-23 NOTE — Progress Notes (Signed)
Medical screening examination/treatment/procedure(s) were performed by non-physician practitioner and as supervising physician I was immediately available for consultation/collaboration. I agree with above. James John, MD   

## 2019-09-23 NOTE — Assessment & Plan Note (Signed)
Agrees to try azelastine nasal spray to see if she can control post nasal drip without overdrying her eyes.

## 2019-09-23 NOTE — Progress Notes (Signed)
Subjective:    Patient ID: Kelli Martinez, female    DOB: Jul 29, 1946, 74 y.o.   MRN: [id number]  HPI F former smoker followed for OSA/ quit CPAP, allergic rhinitis,asthma,  hx pulm embolism/DVT/filter/coumadin(Dr Dwyane Dee), hx R breast Ca/ mastectomy/ xrt/chem/bonemarrow transplant at [facility], hx gastric bypass, DM 2 Office Spirometry 04/27/2015-WNL-FEV1/FVC 0.81, FEV1 2.50/110%  ----------------------------------------------------------------------------------------  03/20/2019- 74 yoF former smoker followed for OSA/ quit CPAP, insomnia, allergic rhinitis,asthma,  hx pulm embolism/DVT/filter/coumadin(Dr Dwyane Dee), hx R breast Ca/ mastectomy/ xrt/chem/bonemarrow transplant at [facility], hx gastric bypass, DM 2, Chronic peripheral Edema HST was ordered but not done after last ov due to Covid restrictions.Marland Kitchen -----pt states her breathing varies, currently taking Advair Diskus daily; Home Sleep Test order in Feb, cancelled d/t COVID, pt states she's not sleeping well at night d/t spinal pain Body weight today 228 lbs Trelegy tried- didn't help much> back to Advair 100, ProAir,   Lives alone. Feels back pain is major sleep disrupter and denies daytime sleepiness is problem. Not currently anticipating surgery. Rhinitis managed with flonase and nasal saline. Dry eyes don't tolerate antihistamine.  09/23/19- 74 yoF former smoker followed for Allergic Rhinitis, Asthma, OSA/ quit CPAP, insomnia,   hx pulm embolism/DVT/filter/coumadin(Dr Dwyane Dee), hx R breast Ca/ mastectomy/ xrt/chem/bonemarrow transplant at [place], hx gastric bypass, DM 2, Chronic peripheral Edema Dr Dwyane Dee manages peripheral edema. Back pain disturbs sleep. ----f/u Allergic asthma mild persistent/OSA   Advair 100, Albuterol rescue  Has awakened herself snoring. Agrees to reschedule HST. Perennial postnasal drip. Avoids AH due to dry eyes, but hasn't tried AH nasal spray. Discussed temazepam, which works very well for occasional use. Had flu vax and  first covid vax. CXR 03/20/2019- No active cardiopulmonary disease.  Review of Systems- see HPI + = positive Constitutional:   No-   weight loss, night sweats, fevers, chills, fatigue, lassitude. HEENT:   No-  headaches, difficulty swallowing, tooth/dental problems, sore throat,   +dry mouth      No-  sneezing, itching, ear ache,  +nasal congestion, +post nasal drip, +blind R eye CV:  No-   chest pain, orthopnea, PND, swelling in lower extremities, anasarca, dizziness, palpitations Resp: No-   shortness of breath with exertion or at rest.              No-   productive cough,   non-productive cough,  No-  coughing up of blood.              No-   change in color of mucus.   Skin: No-   rash or lesions. GI:  No-   heartburn, indigestion, abdominal pain, nausea, vomiting,  GU:  MS:  No-   joint pain,    swelling.   Neuro- : +HPI Psych:  No- change in mood or affect. No depression or anxiety.  No memory loss.   Objective:   Physical Exam General- Alert, Oriented, Affect-appropriate, Distress- none acute  + obese Skin- rash-none, lesions- none, excoriation- none. XRT skin changes right lateral neck Lymphadenopathy- none Head- atraumatic            Eyes- +blind R eye,  clear conjunctivae            Ears- Hearing, canals, TMs normal            Nose- Clear, no- Septal dev, + sticky mucus, polyps, erosion, perforation             Throat- Mallampati III , mucosa clear , drainage- none, tonsils- atrophic, missing teeth Neck- flexible ,  trachea midline, no stridor , thyroid nl, carotid no bruit Chest - symmetrical excursion , unlabored           Heart/CV- RRR , no murmur , no gallop  , no rub, nl s1 s2                           - JVD+1, edema+3, stasis changes- , varices- none           Lung- clear to P&A, wheeze-none, cough- none , dullness-none, rub- none           Chest wall- + right mastectomy Abd-  Br/ Gen/ Rectal- Not done, not indicated Extrem- cyanosis- none, clubbing- none, atrophy-  none, strength- nl   Heavy legs. + cane Neuro- grossly intact to observation  Assessment & Plan:

## 2019-09-23 NOTE — Patient Instructions (Signed)
Pre visit review using our clinic review tool, if applicable. No additional management support is needed unless otherwise documented below in the visit note.  Continue to take 2 tablets daily except 1 tablet on Wednesdays.   Re-check in 6 weeks.

## 2019-09-23 NOTE — Assessment & Plan Note (Signed)
Regular use of Advair is controlling. Appropriate occasional use of rescue inhaler.

## 2019-09-24 ENCOUNTER — Ambulatory Visit (INDEPENDENT_AMBULATORY_CARE_PROVIDER_SITE_OTHER): Payer: Medicare Other | Admitting: Family Medicine

## 2019-09-24 ENCOUNTER — Encounter: Payer: Self-pay | Admitting: Family Medicine

## 2019-09-24 VITALS — BP 128/80 | HR 70 | Temp 97.6°F | Wt 219.0 lb

## 2019-09-24 DIAGNOSIS — E039 Hypothyroidism, unspecified: Secondary | ICD-10-CM | POA: Diagnosis not present

## 2019-09-24 DIAGNOSIS — E1142 Type 2 diabetes mellitus with diabetic polyneuropathy: Secondary | ICD-10-CM

## 2019-09-24 DIAGNOSIS — Z780 Asymptomatic menopausal state: Secondary | ICD-10-CM | POA: Diagnosis not present

## 2019-09-24 DIAGNOSIS — I872 Venous insufficiency (chronic) (peripheral): Secondary | ICD-10-CM | POA: Diagnosis not present

## 2019-09-24 DIAGNOSIS — Z0000 Encounter for general adult medical examination without abnormal findings: Secondary | ICD-10-CM

## 2019-09-24 LAB — BASIC METABOLIC PANEL
BUN: 22 mg/dL (ref 6–23)
CO2: 30 mEq/L (ref 19–32)
Calcium: 9.7 mg/dL (ref 8.4–10.5)
Chloride: 101 mEq/L (ref 96–112)
Creatinine, Ser: 0.99 mg/dL (ref 0.40–1.20)
GFR: 66.76 mL/min (ref >60.00)
Glucose, Bld: 92 mg/dL (ref 70–99)
Potassium: 4.2 mEq/L (ref 3.5–5.1)
Sodium: 137 mEq/L (ref 135–145)

## 2019-09-24 LAB — CBC
HCT: 36.2 % (ref 36.0–46.0)
Hemoglobin: 11.8 g/dL — ABNORMAL LOW (ref 12.0–15.0)
MCHC: 32.5 g/dL (ref 30.0–36.0)
MCV: 86 fl (ref 78.0–100.0)
Platelets: 174 10*3/uL (ref 150.0–400.0)
RBC: 4.22 Mil/uL (ref 3.87–5.11)
RDW: 15.3 % (ref 11.5–15.5)
WBC: 4.2 10*3/uL (ref 4.0–10.5)

## 2019-09-24 LAB — BRAIN NATRIURETIC PEPTIDE: Pro B Natriuretic peptide (BNP): 246 pg/mL — ABNORMAL HIGH (ref 0.0–100.0)

## 2019-09-24 NOTE — Progress Notes (Signed)
Subjective:    Kelli Martinez is a 74 y.o. female who presents for Medicare Annual/Subsequent preventive examination.   Pt with increased LE edema, having difficulty putting on shoes.  Wearing mens sneakers to give her extra room.  Has h/o venous insufficiency.  Seen by Podiatry, needs toenails trimmed.  Has callus on foot that she gets shaved.  Recent podiatry appt cancelled by the office 2/2 COVID.  Pt also notes feeling dizzy, weak, and having hypoglycemia after receiving 1st COVID vaccine on Saturday.  Symptoms caused pt to fall off the toilet. Denies LOC.  L forearm sore s/p fall.  Preventive Screening-Counseling & Management  Tobacco Social History   Tobacco Use  Smoking Status Former Smoker  . Packs/day: 0.50  . Years: 10.00  . Pack years: 5.00  . Types: Cigarettes  . Quit date: 07/31/1978  . Years since quitting: 41.1  Smokeless Tobacco Never Used     Problems Prior to Visit 1.   Current Problems (verified) Patient Active Problem List   Diagnosis Date Noted  . Hypoglycemia following gastrointestinal surgery 03/19/2018  . Edema of both lower extremities due to peripheral venous insufficiency 11/20/2017  . Maxillary sinusitis, acute 08/31/2017  . Encounter for therapeutic drug monitoring 08/26/2013  . Insomnia 05/01/2013  . Long term (current) use of anticoagulants 04/15/2013  . Postsurgical dumping syndrome 04/06/2013  . Bilateral leg edema 04/06/2013  . Reactive hypoglycemia 04/02/2013  . Abdominal pain intermittent-evalulating for internal hernia 11/07/2012  . S/P laparoscopic cholecystectomy 05/31/2012  . Roux Y Gastric Bypass April 2011; repair [facility] 11/22/2011  . Unspecified deficiency anemia 11/14/2011  . Left leg paresthesias 10/27/2010  . Hypothyroid 10/27/2010  . Low back pain 10/27/2010  . Obstructive sleep apnea 09/29/2007  . PULMONARY EMBOLISM 09/25/2007  . Seasonal and perennial allergic rhinitis 09/25/2007  . Allergic asthma, mild persistent,  uncomplicated 97/35/3299  . BREAST CANCER, HX OF 09/25/2007    Medications Prior to Visit Current Outpatient Medications on File Prior to Visit  Medication Sig Dispense Refill  . acarbose (PRECOSE) 25 MG tablet Take 25 mg by mouth 3 (three) times daily with meals. Take 1 tablet by mouth at breakfast and lunch and 2 tablets at dinner.    Marland Kitchen albuterol (PROAIR HFA) 108 (90 Base) MCG/ACT inhaler USE 2 INHALATIONS FOUR TIMES A DAY AS NEEDED ( AS NEEDED RESCUE INHALER) 54 g 3  . azelastine (ASTELIN) 0.1 % nasal spray 1-2 puffs each nostril twice daily as needed 90 mL 3  . baclofen (LIORESAL) 20 MG tablet TAKE 1 TABLET(20 MG) BY MOUTH DAILY 90 tablet 1  . Blood Glucose Monitoring Suppl (FREESTYLE FREEDOM LITE) w/Device KIT Use as directed to check blood sugar once daily. 1 kit 0  . Calcium Carbonate-Vitamin D (CALTRATE 600+D) 600-400 MG-UNIT tablet Take 1 tablet by mouth daily.    . clobetasol cream (TEMOVATE) 2.42 % APPLY 1 APPLICATION TOPICALLY TWICE A DAY 120 g 5  . diazepam (DIASTAT ACUDIAL) 10 MG GEL Place 5 mg rectally once as needed (cramping). 2 Package 0  . ferrous sulfate 325 (65 FE) MG tablet Take 325 mg by mouth daily.      . fluticasone (FLONASE) 50 MCG/ACT nasal spray USE 2 SPRAYS IN EACH NOSTRIL DAILY 48 g 3  . Fluticasone-Salmeterol (ADVAIR DISKUS) 100-50 MCG/DOSE AEPB USE 1 INHALATION EVERY 12 HOURS, RINSE MOUTH 180 each 3  . FREESTYLE LITE test strip USE AS INSTRUCTED TO CHECK BLOOD SUGAR ONCE DAILY 100 each 3  . furosemide (LASIX) 20 MG  tablet TAKE 1 TABLET DAILY 90 tablet 3  . gabapentin (NEURONTIN) 300 MG capsule TAKE 2 CAPSULES THREE TIMES A DAY 540 capsule 3  . HYDROcodone-acetaminophen (NORCO) 5-325 MG tablet Take 1 tablet by mouth 2 (two) times daily as needed for moderate pain. 50 tablet 0  . Lancets (FREESTYLE) lancets USE AS INSTRUCTED TO CHECK BLOOD SUGAR ONCE DAILY 100 each 1  . levothyroxine (SYNTHROID) 125 MCG tablet Take 125 mcg by mouth See admin instructions. Take  one tablet by mouth daily Monday-Friday.    . Magnesium Cl-Calcium Carbonate (SLOW-MAG PO) Take 500 mg by mouth.    . metFORMIN (GLUCOPHAGE) 500 MG tablet TAKE 2 TABLETS ONCE DAILY AT BEDTIME 180 tablet 3  . methylcellulose (ARTIFICIAL TEARS) 1 % ophthalmic solution Place 1 drop into both eyes as needed. Dry eyes    . omeprazole (PRILOSEC) 20 MG capsule TAKE 1 CAPSULE DAILY 90 capsule 3  . pravastatin (PRAVACHOL) 40 MG tablet TAKE 1 TABLET DAILY 90 tablet 3  . spironolactone (ALDACTONE) 100 MG tablet Take 1 tablet (100 mg total) by mouth daily. Take 14m by mouth every other day alternating 2080m 135 tablet 1  . spironolactone (ALDACTONE) 50 MG tablet TAKE 1 TABLET TWICE DAILY ALTERNATING DAYS WITH 1 TABLET DAILY. 270 tablet 3  . temazepam (RESTORIL) 15 MG capsule 1-2 for sleep as needed 90 capsule 1  . vitamin B-12 (CYANOCOBALAMIN) 500 MCG tablet Take 500 mcg by mouth daily.      . Vitamin D, Ergocalciferol, (DRISDOL) 1.25 MG (50000 UT) CAPS capsule TAKE 1 CAPSULE EVERY FRIDAY 12 capsule 3  . warfarin (COUMADIN) 5 MG tablet Take 2 tablets (10 mg total) by mouth daily. TAKE 2 TABLETS BY MOUTH ONCE DAILY, EXCEPT WEDNESDAYS. ON Wednesday, TAKE 1 TABLET DAILY. 205 tablet 3   No current facility-administered medications on file prior to visit.    Current Medications (verified) Current Outpatient Medications  Medication Sig Dispense Refill  . acarbose (PRECOSE) 25 MG tablet Take 25 mg by mouth 3 (three) times daily with meals. Take 1 tablet by mouth at breakfast and lunch and 2 tablets at dinner.    . Marland Kitchenlbuterol (PROAIR HFA) 108 (90 Base) MCG/ACT inhaler USE 2 INHALATIONS FOUR TIMES A DAY AS NEEDED ( AS NEEDED RESCUE INHALER) 54 g 3  . azelastine (ASTELIN) 0.1 % nasal spray 1-2 puffs each nostril twice daily as needed 90 mL 3  . baclofen (LIORESAL) 20 MG tablet TAKE 1 TABLET(20 MG) BY MOUTH DAILY 90 tablet 1  . Blood Glucose Monitoring Suppl (FREESTYLE FREEDOM LITE) w/Device KIT Use as directed to  check blood sugar once daily. 1 kit 0  . Calcium Carbonate-Vitamin D (CALTRATE 600+D) 600-400 MG-UNIT tablet Take 1 tablet by mouth daily.    . clobetasol cream (TEMOVATE) 0.5.49 APPLY 1 APPLICATION TOPICALLY TWICE A DAY 120 g 5  . diazepam (DIASTAT ACUDIAL) 10 MG GEL Place 5 mg rectally once as needed (cramping). 2 Package 0  . ferrous sulfate 325 (65 FE) MG tablet Take 325 mg by mouth daily.      . fluticasone (FLONASE) 50 MCG/ACT nasal spray USE 2 SPRAYS IN EACH NOSTRIL DAILY 48 g 3  . Fluticasone-Salmeterol (ADVAIR DISKUS) 100-50 MCG/DOSE AEPB USE 1 INHALATION EVERY 12 HOURS, RINSE MOUTH 180 each 3  . FREESTYLE LITE test strip USE AS INSTRUCTED TO CHECK BLOOD SUGAR ONCE DAILY 100 each 3  . furosemide (LASIX) 20 MG tablet TAKE 1 TABLET DAILY 90 tablet 3  . gabapentin (NEURONTIN) 300  MG capsule TAKE 2 CAPSULES THREE TIMES A DAY 540 capsule 3  . HYDROcodone-acetaminophen (NORCO) 5-325 MG tablet Take 1 tablet by mouth 2 (two) times daily as needed for moderate pain. 50 tablet 0  . Lancets (FREESTYLE) lancets USE AS INSTRUCTED TO CHECK BLOOD SUGAR ONCE DAILY 100 each 1  . levothyroxine (SYNTHROID) 125 MCG tablet Take 125 mcg by mouth See admin instructions. Take one tablet by mouth daily Monday-Friday.    . Magnesium Cl-Calcium Carbonate (SLOW-MAG PO) Take 500 mg by mouth.    . metFORMIN (GLUCOPHAGE) 500 MG tablet TAKE 2 TABLETS ONCE DAILY AT BEDTIME 180 tablet 3  . methylcellulose (ARTIFICIAL TEARS) 1 % ophthalmic solution Place 1 drop into both eyes as needed. Dry eyes    . omeprazole (PRILOSEC) 20 MG capsule TAKE 1 CAPSULE DAILY 90 capsule 3  . pravastatin (PRAVACHOL) 40 MG tablet TAKE 1 TABLET DAILY 90 tablet 3  . spironolactone (ALDACTONE) 100 MG tablet Take 1 tablet (100 mg total) by mouth daily. Take 120m by mouth every other day alternating 2063m 135 tablet 1  . spironolactone (ALDACTONE) 50 MG tablet TAKE 1 TABLET TWICE DAILY ALTERNATING DAYS WITH 1 TABLET DAILY. 270 tablet 3  .  temazepam (RESTORIL) 15 MG capsule 1-2 for sleep as needed 90 capsule 1  . vitamin B-12 (CYANOCOBALAMIN) 500 MCG tablet Take 500 mcg by mouth daily.      . Vitamin D, Ergocalciferol, (DRISDOL) 1.25 MG (50000 UT) CAPS capsule TAKE 1 CAPSULE EVERY FRIDAY 12 capsule 3  . warfarin (COUMADIN) 5 MG tablet Take 2 tablets (10 mg total) by mouth daily. TAKE 2 TABLETS BY MOUTH ONCE DAILY, EXCEPT WEDNESDAYS. ON Wednesday, TAKE 1 TABLET DAILY. 205 tablet 3   No current facility-administered medications for this visit.     Allergies (verified) Adhesive [tape]   PAST HISTORY  Family History Family History  Problem Relation Age of Onset  . Sudden death Mother        car accident  . Cancer Father 7034     lung  . Cancer Sister [id number]     lung cancer  . Asthma Daughter     Social History Social History   Tobacco Use  . Smoking status: Former Smoker    Packs/day: 0.50    Years: 10.00    Pack years: 5.00    Types: Cigarettes    Quit date: 07/31/1978    Years since quitting: 41.1  . Smokeless tobacco: Never Used  Substance Use Topics  . Alcohol use: No     Are there smokers in your home (other than you)? No  Risk Factors Current exercise habits: The patient does not participate in regular exercise at present.  Dietary issues discussed: balanced diet.   Cardiac risk factors: advanced age (older than 5517or men, 6568or women), diabetes mellitus, dyslipidemia and obesity (BMI >= 30 kg/m2).  Depression Screen (Note: if answer to either of the following is "Yes", a more complete depression screening is indicated)   Over the past two weeks, have you felt down, depressed or hopeless? No  Over the past two weeks, have you felt little interest or pleasure in doing things? No  Have you lost interest or pleasure in daily life? No  Do you often feel hopeless? No  Do you cry easily over simple problems? No  Activities of Daily Living In your present state of health, do you have any difficulty  performing the following activities?:  Driving? No Managing  money?  No Feeding yourself? No Getting from bed to chair? No Climbing a flight of stairs? Yes Preparing food and eating?: No Bathing or showering? No Getting dressed: No Getting to the toilet? No Using the toilet:No Moving around from place to place: No In the past year have you fallen or had a near fall?:Yes  Hearing Difficulties: No Do you often ask people to speak up or repeat themselves? No Do you experience ringing or noises in your ears? No Do you have difficulty understanding soft or whispered voices? Yes   Do you feel that you have a problem with memory? No  Do you often misplace items? No  Do you feel safe at home?  Yes  Cognitive Testing  Alert? Yes  Normal Appearance?Yes  Oriented to person? Yes  Place? Yes   Time? Yes  Displays appropriate judgment?Yes  Can read the correct time from a watch face?Yes   Advanced Directives have been discussed with the patient? Yes  List the Names of Other Physician/Practitioners you currently use: 1.    Indicate any recent Medical Services you may have received from other than [facility] providers in the past year (date may be approximate).  Immunization History  Administered Date(s) Administered  . Fluad Quad(high Dose 65+) 03/20/2019  . Influenza Split 04/23/2012  . Influenza Whole 03/31/2009  . Influenza, High Dose Seasonal PF 04/07/2015, 04/21/2016, 04/27/2017, 04/23/2018  . Influenza,inj,Quad PF,6+ Mos 04/21/2014  . Influenza-Unspecified 05/09/2017  . PFIZER SARS-COV-2 Vaccination 09/14/2019  . Pneumococcal Conjugate-13 07/06/2014  . Pneumococcal Polysaccharide-23 09/06/1998, 06/19/2019    Screening Tests Health Maintenance  Topic Date Due  . Hepatitis C Screening  1946/07/15  . OPHTHALMOLOGY EXAM  12/09/1957  . COLONOSCOPY  07/31/2013  . MAMMOGRAM  11/16/2017  . FOOT EXAM  11/21/2018  . TETANUS/TDAP  08/01/2019  . HEMOGLOBIN A1C  12/14/2019  . URINE  MICROALBUMIN  02/17/2020  . INFLUENZA VACCINE  Completed  . DEXA SCAN  Completed  . PNA vac Low Risk Adult  Completed    All answers were reviewed with the patient and necessary referrals were made:  Billie Ruddy, MD   09/24/2019   History reviewed: allergies, current medications, past family history, past medical history, past social history, past surgical history and problem list  Review of Systems Pertinent items noted in HPI and remainder of comprehensive ROS otherwise negative.    Objective:     Body mass index is 33.79 kg/m. BP 128/80 (BP Location: Left Arm, Patient Position: Sitting, Cuff Size: Large)   Pulse 70   Temp 97.6 F (36.4 C) (Temporal)   Wt 219 lb (99.3 kg)   SpO2 98%   BMI 33.79 kg/m   Gen. Pleasant, well developed, well-nourished, in NAD HEENT - [place]/AT, PERRL, EOMI, conjunctive clear, no scleral icterus, no nasal drainage, pharynx without erythema or exudate.  Surgical incision anterior neck. Lungs: no use of accessory muscles, CTAB, no wheezes, rales or rhonchi Cardiovascular: RRR, No r/g/m, 2+ pedal edema, trace at knee Abdomen: BS present, soft, nontender,nondistended Musculoskeletal: No deformities, moves all four extremities, no cyanosis or clubbing, normal tone Neuro:  A&Ox3, CN II-XII intact, normal gait Skin:  Warm, dry, intact.  L foot with callus on lateral plantar surface without drainage, erythema, induration. Callus pared. Diabetic foot exam performed, see screening.   Assessment:     Subsequent medicare wellness visit.  Pt with sig LE edema and callus of L foot.    Plan:     During the course of  the visit the patient was educated and counseled about appropriate screening and preventive services including:    Screening mammography  Colorectal cancer screening  Diet review for nutrition referral? Yes ____  Not Indicated _x_  Hypothyroidism, unspecified type -TSH 2.91 on 08/14/19 -continue synthroid 125 mcg -continue f/u with  Endocrinology, Dr. Dwyane Dee  Edema of both lower extremities due to peripheral venous insufficiency  - Plan: CBC (no diff), Basic metabolic panel, Brain Natriuretic Peptide, furosemide (LASIX) 20 MG tablet, potassium chloride SA (KLOR-CON) 20 MEQ tablet, ECHOCARDIOGRAM COMPLETE  Postmenopausal  - Plan: DG Bone Density  DM type 2 with diabetic peripheral neuropathy ([place])  - Plan: For Home Use Only DME Diabetic Shoe   Patient Instructions (the written plan) was given to the patient.  Medicare Attestation I have personally reviewed: The patient's medical and social history Their use of alcohol, tobacco or illicit drugs Their current medications and supplements The patient's functional ability including ADLs,fall risks, home safety risks, cognitive, and hearing and visual impairment Diet and physical activities Evidence for depression or mood disorders  The patient's weight, height, BMI, and visual acuity have been recorded in the chart.  I have made referrals, counseling, and provided education to the patient based on review of the above and I have provided the patient with a written personalized care plan for preventive services.     Billie Ruddy, MD   09/24/2019

## 2019-09-25 MED ORDER — POTASSIUM CHLORIDE CRYS ER 20 MEQ PO TBCR: 20.0000 meq | EXTENDED_RELEASE_TABLET | Freq: Every day | ORAL | 0 refills | Status: DC

## 2019-09-25 MED ORDER — FUROSEMIDE 20 MG PO TABS: 40.0000 mg | ORAL_TABLET | Freq: Every day | ORAL | 0 refills | Status: DC

## 2019-09-27 ENCOUNTER — Encounter: Payer: Self-pay | Admitting: Family Medicine

## 2019-10-07 ENCOUNTER — Ambulatory Visit: Payer: Medicare Other | Attending: Internal Medicine

## 2019-10-07 DIAGNOSIS — Z23 Encounter for immunization: Secondary | ICD-10-CM | POA: Insufficient documentation

## 2019-10-07 NOTE — Progress Notes (Signed)
   Covid-19 Vaccination Clinic  Name:  Laili Manary    MRN: [id number] DOB: June 17, 1946  10/07/2019  Ms. Scotto was observed post Covid-19 immunization for 15 minutes without incident. She was provided with Vaccine Information Sheet and instruction to access the V-Safe system.   Ms. Munuz was instructed to call 911 with any severe reactions post vaccine: Marland Kitchen Difficulty breathing  . Swelling of face and throat  . A fast heartbeat  . A bad rash all over body  . Dizziness and weakness   Immunizations Administered    Name Date Dose VIS Date Route   Pfizer COVID-19 Vaccine 10/07/2019 11:29 AM 0.3 mL 07/11/2019 Intramuscular   Manufacturer: [place]   Lot: [id number]   Frannie: [id number]

## 2019-10-08 ENCOUNTER — Ambulatory Visit: Payer: Medicare Other

## 2019-10-09 ENCOUNTER — Other Ambulatory Visit: Payer: Self-pay

## 2019-10-09 ENCOUNTER — Ambulatory Visit (HOSPITAL_COMMUNITY): Payer: Medicare Other | Attending: Internal Medicine

## 2019-10-09 DIAGNOSIS — Z87891 Personal history of nicotine dependence: Secondary | ICD-10-CM | POA: Diagnosis not present

## 2019-10-09 DIAGNOSIS — E119 Type 2 diabetes mellitus without complications: Secondary | ICD-10-CM | POA: Insufficient documentation

## 2019-10-09 DIAGNOSIS — R600 Localized edema: Secondary | ICD-10-CM

## 2019-10-09 DIAGNOSIS — Z853 Personal history of malignant neoplasm of breast: Secondary | ICD-10-CM | POA: Insufficient documentation

## 2019-10-09 DIAGNOSIS — G4730 Sleep apnea, unspecified: Secondary | ICD-10-CM | POA: Diagnosis not present

## 2019-10-09 DIAGNOSIS — I080 Rheumatic disorders of both mitral and aortic valves: Secondary | ICD-10-CM | POA: Insufficient documentation

## 2019-10-09 DIAGNOSIS — I872 Venous insufficiency (chronic) (peripheral): Secondary | ICD-10-CM | POA: Diagnosis not present

## 2019-10-09 DIAGNOSIS — D649 Anemia, unspecified: Secondary | ICD-10-CM | POA: Insufficient documentation

## 2019-10-09 DIAGNOSIS — Z86711 Personal history of pulmonary embolism: Secondary | ICD-10-CM | POA: Insufficient documentation

## 2019-10-28 ENCOUNTER — Other Ambulatory Visit: Payer: Self-pay | Admitting: Endocrinology

## 2019-10-28 ENCOUNTER — Telehealth: Payer: Self-pay | Admitting: Family Medicine

## 2019-10-28 NOTE — Telephone Encounter (Signed)
Please advise 

## 2019-10-28 NOTE — Telephone Encounter (Signed)
Patient states patient is going to get injections in her neck and back.  Dr Nelva Bush is wanting the patient to get ok on how long the patient can be off her Coumadin.

## 2019-10-29 NOTE — Telephone Encounter (Signed)
Per Dr. Ronnie Derby request, refill has been forwarded to Dr. Volanda Napoleon.

## 2019-10-29 NOTE — Telephone Encounter (Signed)
Per Dr. Ronnie Derby request, I am forwarding this refill request. Please review and refill if appropriate.

## 2019-10-29 NOTE — Telephone Encounter (Signed)
Please advise if refill is appropriate. Your notes indicate this is managed by neurology

## 2019-10-29 NOTE — Telephone Encounter (Signed)
Please forward to PCP to refill.  

## 2019-11-04 ENCOUNTER — Ambulatory Visit (INDEPENDENT_AMBULATORY_CARE_PROVIDER_SITE_OTHER): Payer: Medicare Other | Admitting: General Practice

## 2019-11-04 ENCOUNTER — Other Ambulatory Visit: Payer: Self-pay

## 2019-11-04 DIAGNOSIS — Z7901 Long term (current) use of anticoagulants: Secondary | ICD-10-CM | POA: Diagnosis not present

## 2019-11-04 DIAGNOSIS — G4733 Obstructive sleep apnea (adult) (pediatric): Secondary | ICD-10-CM

## 2019-11-04 LAB — POCT INR: INR: 2.3 (ref 2.0–3.0)

## 2019-11-04 NOTE — Telephone Encounter (Signed)
What type of injections is pt planning of having?  Pt needs to coordinate with Coumadin clinic in regards to possible bridging if needs to stop coumadin.

## 2019-11-04 NOTE — Progress Notes (Signed)
Medical screening examination/treatment/procedure(s) were performed by non-physician practitioner and as supervising physician I was immediately available for consultation/collaboration. I agree with above. Stephano Arrants, MD   

## 2019-11-04 NOTE — Telephone Encounter (Signed)
Left detailed message for pt regarding her question about getting injections a if she needs to hold her coumadin, advise pt to call office with any questions

## 2019-11-04 NOTE — Patient Instructions (Signed)
Pre visit review using our clinic review tool, if applicable. No additional management support is needed unless otherwise documented below in the visit note.  Continue to take 2 tablets daily except 1 tablet on Wednesdays.   Re-check in 6 weeks.

## 2019-11-05 DIAGNOSIS — G4733 Obstructive sleep apnea (adult) (pediatric): Secondary | ICD-10-CM | POA: Diagnosis not present

## 2019-11-07 NOTE — Telephone Encounter (Signed)
Spoke with pt state that she is getting cortisone injections, pt states that she had appointment with [facility] for COUMADIN Check, pt state that Jenny Reichmann is not authorized to give medical clearance. PT Orthopedics office wants to know when pt should stop taking coumadin before the injections app and when to start back the Coumadin. Please Advise

## 2019-11-10 NOTE — Telephone Encounter (Signed)
Will need to see who is covering Coumadin clinic as C. Luciana Axe is out of the office until 4/20.  Pt will need to stop coumadin 5 days prior to injections with Ortho, however she will need to be bridged given her hx.  Will see who is available to monitor INR and will need to set pt up with prescriptions.

## 2019-11-10 NOTE — Telephone Encounter (Signed)
Spoke with pt verbalized understanding that Dr Volanda Napoleon advise to stop coumadin 5 days prior to her injections with Ortho, Pt is aware that she will need monitoring of her INR  due to her history, Advised pt that [facility] Herb is out until 11/18/19, advised pt that the office will call her to f/u on her prescription for Coumadin

## 2019-11-12 ENCOUNTER — Telehealth: Payer: Self-pay

## 2019-11-12 ENCOUNTER — Other Ambulatory Visit: Payer: Self-pay

## 2019-11-12 ENCOUNTER — Other Ambulatory Visit: Payer: Self-pay | Admitting: Endocrinology

## 2019-11-12 MED ORDER — BACLOFEN 10 MG PO TABS: 10.0000 mg | ORAL_TABLET | Freq: Two times a day (BID) | ORAL | 0 refills | Status: DC

## 2019-11-12 NOTE — Telephone Encounter (Signed)
Patient calling today stating her Rx for baclofen that was called in today for 20 mg was accurate but she also needs the Rx for the 10 mg tablets because she is taking  two 10 mg tablets daily and one 20 mg tablet daily per MD orders- I did not see this change in last note-please send 90 day supply of the 10 mg tabs to [facility] but also send a 30 day supply of the 10 mg tablets to [facility] on [address] because she only has 3 pills left

## 2019-11-12 NOTE — Telephone Encounter (Signed)
After reviewing MD last office visit note, it does appear that pt is taking 10mg  BID and 20mg  at HS.  New Rx for Baclofen 10mg  BID was sent in 30 day supply to local retail pharmacy and 90 day supply was then sent to mail order pharmacy.

## 2019-11-20 ENCOUNTER — Other Ambulatory Visit: Payer: Self-pay

## 2019-11-20 ENCOUNTER — Ambulatory Visit (INDEPENDENT_AMBULATORY_CARE_PROVIDER_SITE_OTHER): Payer: Medicare Other | Admitting: Pharmacist Clinician (PhC)/ Clinical Pharmacy Specialist

## 2019-11-20 DIAGNOSIS — Z7901 Long term (current) use of anticoagulants: Secondary | ICD-10-CM | POA: Diagnosis not present

## 2019-11-20 LAB — POCT INR: INR: 1.1 — AB (ref 2.0–3.0)

## 2019-11-20 NOTE — Patient Instructions (Signed)
Resume warfarin as directed by your clinic (2 tablets daily except 1 tablet on Wednesdays)

## 2019-11-21 ENCOUNTER — Other Ambulatory Visit: Payer: Self-pay | Admitting: Endocrinology

## 2019-11-29 ENCOUNTER — Emergency Department (HOSPITAL_COMMUNITY)
Admission: EM | Admit: 2019-11-29 | Discharge: 2019-11-29 | Disposition: A | Discharge status: Home / Self Care | Payer: Medicare Other | Attending: Emergency Medicine | Admitting: Emergency Medicine

## 2019-11-29 DIAGNOSIS — I10 Essential (primary) hypertension: Secondary | ICD-10-CM | POA: Diagnosis not present

## 2019-11-29 DIAGNOSIS — Z87891 Personal history of nicotine dependence: Secondary | ICD-10-CM | POA: Insufficient documentation

## 2019-11-29 DIAGNOSIS — Z7984 Long term (current) use of oral hypoglycemic drugs: Secondary | ICD-10-CM | POA: Insufficient documentation

## 2019-11-29 DIAGNOSIS — E1140 Type 2 diabetes mellitus with diabetic neuropathy, unspecified: Secondary | ICD-10-CM | POA: Diagnosis not present

## 2019-11-29 DIAGNOSIS — M62838 Other muscle spasm: Secondary | ICD-10-CM | POA: Diagnosis not present

## 2019-11-29 DIAGNOSIS — Z853 Personal history of malignant neoplasm of breast: Secondary | ICD-10-CM | POA: Diagnosis not present

## 2019-11-29 DIAGNOSIS — E039 Hypothyroidism, unspecified: Secondary | ICD-10-CM | POA: Diagnosis not present

## 2019-11-29 DIAGNOSIS — Z9481 Bone marrow transplant status: Secondary | ICD-10-CM | POA: Insufficient documentation

## 2019-11-29 DIAGNOSIS — Z79899 Other long term (current) drug therapy: Secondary | ICD-10-CM | POA: Diagnosis not present

## 2019-11-29 DIAGNOSIS — Z7901 Long term (current) use of anticoagulants: Secondary | ICD-10-CM | POA: Insufficient documentation

## 2019-11-29 DIAGNOSIS — M7918 Myalgia, other site: Secondary | ICD-10-CM | POA: Diagnosis present

## 2019-11-29 LAB — CBC WITH DIFFERENTIAL/PLATELET
Abs Immature Granulocytes: 0.02 10*3/uL (ref 0.00–0.07)
Basophils Absolute: 0.1 10*3/uL (ref 0.0–0.1)
Basophils Relative: 1 %
Eosinophils Absolute: 0 10*3/uL (ref 0.0–0.5)
Eosinophils Relative: 0 %
HCT: 38.9 % (ref 36.0–46.0)
Hemoglobin: 12.2 g/dL (ref 12.0–15.0)
Immature Granulocytes: 0 %
Lymphocytes Relative: 16 %
Lymphs Abs: 1.2 10*3/uL (ref 0.7–4.0)
MCH: 28 pg (ref 26.0–34.0)
MCHC: 31.4 g/dL (ref 30.0–36.0)
MCV: 89.2 fL (ref 80.0–100.0)
Monocytes Absolute: 0.3 10*3/uL (ref 0.1–1.0)
Monocytes Relative: 4 %
Neutro Abs: 5.8 10*3/uL (ref 1.7–7.7)
Neutrophils Relative %: 79 %
Platelets: 156 10*3/uL (ref 150–400)
RBC: 4.36 MIL/uL (ref 3.87–5.11)
RDW: 14.5 % (ref 11.5–15.5)
WBC: 7.4 10*3/uL (ref 4.0–10.5)
nRBC: 0 % (ref 0.0–0.2)

## 2019-11-29 LAB — PROTIME-INR
INR: 1.6 — ABNORMAL HIGH (ref 0.8–1.2)
Prothrombin Time: 18.3 seconds — ABNORMAL HIGH (ref 11.4–15.2)

## 2019-11-29 LAB — BASIC METABOLIC PANEL
Anion gap: 8 (ref 5–15)
BUN: 22 mg/dL (ref 8–23)
CO2: 28 mmol/L (ref 22–32)
Calcium: 9.8 mg/dL (ref 8.9–10.3)
Chloride: 99 mmol/L (ref 98–111)
Creatinine, Ser: 1.04 mg/dL — ABNORMAL HIGH (ref 0.44–1.00)
GFR calc Af Amer: >60 mL/min (ref >60)
GFR calc non Af Amer: 54 mL/min — ABNORMAL LOW (ref >60)
Glucose, Bld: 112 mg/dL — ABNORMAL HIGH (ref 70–99)
Potassium: 4.8 mmol/L (ref 3.5–5.1)
Sodium: 135 mmol/L (ref 135–145)

## 2019-11-29 LAB — MAGNESIUM: Magnesium: 2.2 mg/dL (ref 1.7–2.4)

## 2019-11-29 MED ORDER — BACLOFEN 10 MG PO TABS
20.0000 mg | ORAL_TABLET | Freq: Once | ORAL | Status: AC
  Administered 2019-11-29: 20 mg via ORAL
  Filled 2019-11-29: qty 2

## 2019-11-29 MED ORDER — SODIUM CHLORIDE 0.9 % IV BOLUS
500.0000 mL | Freq: Once | INTRAVENOUS | Status: AC
  Administered 2019-11-29: 500 mL via INTRAVENOUS

## 2019-11-29 NOTE — ED Triage Notes (Signed)
Per [facility] pt c/o cramping that started this morning. Pain is worse upon standing. Took meds at home today but not relief.  Vitals: 170/110, 76HR 100% on RA. CBg 239. 97 temp.

## 2019-11-29 NOTE — ED Provider Notes (Signed)
[facility] DEPT Provider Note   CSN: [id number] Arrival date & time: 11/29/19  1425     History Chief Complaint  Patient presents with  . Spasms    Nalea Salce is a 74 y.o. female.  The history is provided by the patient.  Illness Location:  Hands, legs Quality:  Spams Severity:  Mild Onset quality:  Gradual Progression:  Resolved Chronicity:  Recurrent Relieved by:  Nothing Worsened by:  Walking Associated symptoms: myalgias (spasms)   Associated symptoms: no abdominal pain, no chest pain, no cough, no ear pain, no fever, no headaches, no rash, no shortness of breath, no sore throat and no vomiting        Past Medical History:  Diagnosis Date  . Allergy   . Anemia   . Arthritis   . Asthma   . Back pain, chronic    GETS INJECTIONS IN BACK  . Blind right eye    hemorrhage  . Blood transfusion   . Cancer ([place])    breast 1994  . Clotting disorder ([place])   . Diabetes mellitus without complication ([place])    takes precose  . Elevated liver enzymes   . Gallstones   . GERD (gastroesophageal reflux disease)   . HX: breast cancer   . Hyperlipidemia   . Hypertension   . Hypothyroid   . Lymphedema of arm    RT  . Morbid obesity ([place])   . OSA (obstructive sleep apnea)    has not used in 2 years-lost weight  . Osteoporosis   . Peripheral neuropathy    on gabapentin  . PONV (postoperative nausea and vomiting)   . Psoriasis   . Pulmonary embolism ([place]) 1998 / 1994 /1968  . Syncope and collapse 2011   due to anemia    Patient Active Problem List   Diagnosis Date Noted  . Hypoglycemia following gastrointestinal surgery 03/19/2018  . Edema of both lower extremities due to peripheral venous insufficiency 11/20/2017  . Maxillary sinusitis, acute 08/31/2017  . Encounter for therapeutic drug monitoring 08/26/2013  . Insomnia 05/01/2013  . Long term (current) use of anticoagulants 04/15/2013  . Postsurgical dumping  syndrome 04/06/2013  . Bilateral leg edema 04/06/2013  . Reactive hypoglycemia 04/02/2013  . Abdominal pain intermittent-evalulating for internal hernia 11/07/2012  . S/P laparoscopic cholecystectomy 05/31/2012  . Roux Y Gastric Bypass April 2011; repair [facility] 11/22/2011  . Unspecified deficiency anemia 11/14/2011  . Left leg paresthesias 10/27/2010  . Hypothyroid 10/27/2010  . Low back pain 10/27/2010  . Obstructive sleep apnea 09/29/2007  . PULMONARY EMBOLISM 09/25/2007  . Seasonal and perennial allergic rhinitis 09/25/2007  . Allergic asthma, mild persistent, uncomplicated 41/74/0814  . BREAST CANCER, HX OF 09/25/2007    Past Surgical History:  Procedure Laterality Date  . ABDOMINAL HYSTERECTOMY    . BONE MARROW TRANSPLANT  1994  . CARDIOVASCULAR STRESS TEST  05/23/2005   EF 53%  . carpal tunnell     bil  . cataracts    . CHOLECYSTECTOMY  05/10/2012   Procedure: LAPAROSCOPIC CHOLECYSTECTOMY WITH INTRAOPERATIVE CHOLANGIOGRAM;  Surgeon: Pedro Earls, MD;  Location: [facility];  Service: General;  Laterality: N/A;  Laparoscopic Cholecystectomy with Intraoperative Cholangiogram  . GASTRIC BYPASS  2011   bariatric surgery  . HIATAL HERNIA REPAIR    . IVC filter     recurrent DVT  . KNEE ARTHROTOMY  1998  . MASS EXCISION Right 11/24/2016   Procedure: EXCISION RIGHT AXILLARY LESION AND CHEST WALL  LESION;  Surgeon: Johnathan Hausen, MD;  Location: [facility];  Service: General;  Laterality: Right;  . MASTECTOMY     Right  . US ECHOCARDIOGRAPHY  10/27/08   EF 55-60%     OB History   No obstetric history on file.     Family History  Problem Relation Age of Onset  . Sudden death Mother        car accident  . Cancer Father 39       lung  . Cancer Sister 69       lung cancer  . Asthma Daughter     Social History   Tobacco Use  . Smoking status: Former Smoker    Packs/day: 0.50    Years: 10.00    Pack years: 5.00    Types: Cigarettes    Quit date:  07/31/1978    Years since quitting: 41.3  . Smokeless tobacco: Never Used  Substance Use Topics  . Alcohol use: No  . Drug use: No    Home Medications Prior to Admission medications   Medication Sig Start Date End Date Taking? Authorizing Provider  acarbose (PRECOSE) 25 MG tablet Take 25-50 mg by mouth See admin instructions. Take 1 tablet (53m) by mouth at breakfast and lunch and 2 tablets (525m at dinner.   Yes [provider]  albuterol (PROAIR HFA) 108 (90 Base) MCG/ACT inhaler USE 2 INHALATIONS FOUR TIMES A DAY AS NEEDED ( AS NEEDED RESCUE INHALER) Patient taking differently: Inhale 2 puffs into the lungs every 6 (six) hours as needed for wheezing or shortness of breath.  09/23/19  Yes Young, ClTarri Fuller, MD  azelastine (ASTELIN) 0.1 % nasal spray 1-2 puffs each nostril twice daily as needed Patient taking differently: Place 1-2 sprays into both nostrils 2 (two) times daily as needed for rhinitis or allergies. 1-2 puffs each nostril twice daily as needed 09/23/19  Yes Young, ClTarri Fuller, MD  baclofen (LIORESAL) 10 MG tablet Take 1 tablet (10 mg total) by mouth 2 (two) times daily. 11/12/19  Yes KuElayne SnareMD  baclofen (LIORESAL) 20 MG tablet TAKE 1 TABLET DAILY Patient taking differently: Take 20 mg by mouth at bedtime.  11/12/19  Yes KuElayne SnareMD  Calcium Carbonate-Vitamin D (CALTRATE 600+D) 600-400 MG-UNIT tablet Take 1 tablet by mouth daily.   Yes [provider]  clobetasol cream (TEMOVATE) 0.3.20 APPLY 1 APPLICATION TOPICALLY TWICE A DAY Patient taking differently: Apply 1 application topically 2 (two) times daily.  12/19/18  Yes KuElayne SnareMD  diazepam (DIASTAT ACUDIAL) 10 MG GEL Place 5 mg rectally once as needed (cramping). 02/13/17  Yes YaDrenda FreezeMD  ferrous sulfate 325 (65 FE) MG tablet Take 325 mg by mouth daily.     Yes [provider]  fluticasone (FLONASE) 50 MCG/ACT nasal spray USE 2 SPRAYS IN EACH NOSTRIL DAILY Patient taking  differently: Place 2 sprays into both nostrils daily as needed for allergies.  09/10/19  Yes Young, ClTarri Fuller, MD  Fluticasone-Salmeterol (ADVAIR DISKUS) 100-50 MCG/DOSE AEPB USE 1 INHALATION EVERY 12 HOURS, RINSE MOUTH Patient taking differently: Inhale 1 puff into the lungs every 12 (twelve) hours. USE 1 INHALATION EVERY 12 HOURS, RINSE MOUTH 08/18/19  Yes Young, Clinton D, MD  furosemide (LASIX) 20 MG tablet TAKE 1 TABLET DAILY Patient taking differently: Take 20 mg by mouth daily.  04/15/19  Yes KuElayne SnareMD  gabapentin (NEURONTIN) 300 MG capsule TAKE 2 CAPSULES THREE TIMES A DAY Patient taking  differently: Take 600 mg by mouth 3 (three) times daily.  02/03/19  Yes Patel, Donika K, DO  HYDROcodone-acetaminophen (NORCO) 5-325 MG tablet Take 1 tablet by mouth 2 (two) times daily as needed for moderate pain. Patient taking differently: Take 0.5-1 tablets by mouth 3 (three) times daily as needed for moderate pain or severe pain.  04/12/18  Yes Elayne Snare, MD  levothyroxine (LEVOXYL) 125 MCG tablet Take 125 mcg by mouth See admin instructions. Take 1 tablet (125 mcg) by mouth daily on Monday-Friday   Yes [provider]  Magnesium Cl-Calcium Carbonate (SLOW-MAG PO) Take 500 mg by mouth at bedtime.    Yes [provider]  metFORMIN (GLUCOPHAGE) 500 MG tablet TAKE 2 TABLETS ONCE DAILY AT BEDTIME Patient taking differently: Take 1,000 mg by mouth at bedtime.  06/25/19  Yes Elayne Snare, MD  omeprazole (PRILOSEC) 20 MG capsule TAKE 1 CAPSULE DAILY Patient taking differently: Take 20 mg by mouth daily.  04/29/19  Yes Elayne Snare, MD  pravastatin (PRAVACHOL) 40 MG tablet TAKE 1 TABLET DAILY Patient taking differently: Take 40 mg by mouth daily.  11/21/19  Yes Elayne Snare, MD  spironolactone (ALDACTONE) 100 MG tablet TAKE 1 TABLET ( 100 MG ) EVERY OTHER DAY ALTERNATING WITH 2 TABLETS (200 MG) Patient taking differently: Take 100 mg by mouth in the morning.  11/12/19  Yes Elayne Snare, MD    spironolactone (ALDACTONE) 50 MG tablet TAKE 1 TABLET TWICE DAILY ALTERNATING DAYS WITH 1 TABLET DAILY. Patient taking differently: Take 50 mg by mouth every evening.  04/15/19  Yes Elayne Snare, MD  temazepam (RESTORIL) 15 MG capsule 1-2 for sleep as needed Patient taking differently: Take 15-30 mg by mouth at bedtime as needed for sleep. 1-2 for sleep as needed 08/31/17  Yes Young, Tarri Fuller D, MD  vitamin B-12 (CYANOCOBALAMIN) 500 MCG tablet Take 500 mcg by mouth daily.     Yes [provider]  Vitamin D, Ergocalciferol, (DRISDOL) 1.25 MG (50000 UT) CAPS capsule TAKE 1 [place] Patient taking differently: Take 50,000 Units by mouth every 7 (seven) days.  08/08/19  Yes Elayne Snare, MD  warfarin (COUMADIN) 5 MG tablet Take 2 tablets (10 mg total) by mouth daily. TAKE 2 TABLETS BY MOUTH ONCE DAILY, EXCEPT WEDNESDAYS. ON Wednesday, TAKE 1 TABLET DAILY. Patient taking differently: Take 5-10 mg by mouth See admin instructions. Take 2 tablets (72m) by mouth once daily, except wednesdays. On wednesdays, take 1 tablet (522m by mouth daily. 06/24/19  Yes BaBillie RuddyMD  Blood Glucose Monitoring Suppl (FREESTYLE FREEDOM LITE) w/Device KIT Use as directed to check blood sugar once daily. 02/20/19   KuElayne SnareMD  FREESTYLE LITE test strip USE AS INSTRUCTED TO CHECK BLOOD SUGAR ONCE DAILY 03/17/19   KuElayne SnareMD  furosemide (LASIX) 20 MG tablet Take 2 tablets (40 mg total) by mouth daily for 5 days. 09/24/19 09/29/19  BaBillie RuddyMD  Lancets (FREESTYLE) lancets USE AS INSTRUCTED TO CHECK BLOOD SUGAR ONCE DAILY 11/13/17   KuElayne SnareMD  methylcellulose (ARTIFICIAL TEARS) 1 % ophthalmic solution Place 1 drop into both eyes as needed. Dry eyes    [provider]  potassium chloride SA (KLOR-CON) 20 MEQ tablet Take 1 tablet (20 mEq total) by mouth daily for 5 days. 09/25/19 09/30/19  BaBillie RuddyMD    Allergies    Adhesive [tape]  Review of Systems   Review of Systems   Constitutional: Negative for chills and fever.  HENT: Negative for ear pain and sore throat.   Eyes: Negative for pain and visual disturbance.  Respiratory: Negative for cough and shortness of breath.   Cardiovascular: Negative for chest pain and palpitations.  Gastrointestinal: Negative for abdominal pain and vomiting.  Genitourinary: Negative for dysuria and hematuria.  Musculoskeletal: Positive for myalgias (spasms). Negative for arthralgias and back pain.  Skin: Negative for color change and rash.  Neurological: Negative for dizziness, tremors, seizures, syncope, facial asymmetry, weakness, light-headedness, numbness and headaches.  All other systems reviewed and are negative.   Physical Exam Updated Vital Signs  ED Triage Vitals [11/29/19 1444]  Enc Vitals Group     BP (!) 126/98     Pulse Rate 86     Resp 19     Temp 97.7 F (36.5 C)     Temp Source Oral     SpO2 99 %     Weight      Height      Head Circumference      Peak Flow      Pain Score      Pain Loc      Pain Edu?      Excl. in [place]?     Physical Exam Vitals and nursing note reviewed.  Constitutional:      General: She is not in acute distress.    Appearance: She is well-developed. She is not ill-appearing.  HENT:     Head: Normocephalic and atraumatic.     Nose: Nose normal.     Mouth/Throat:     Mouth: Mucous membranes are moist.  Eyes:     Conjunctiva/sclera: Conjunctivae normal.     Pupils: Pupils are equal, round, and reactive to light.  Cardiovascular:     Rate and Rhythm: Normal rate and regular rhythm.     Pulses: Normal pulses.     Heart sounds: Normal heart sounds. No murmur.  Pulmonary:     Effort: Pulmonary effort is normal. No respiratory distress.     Breath sounds: Normal breath sounds.  Abdominal:     General: Abdomen is flat. There is no distension.     Palpations: Abdomen is soft.     Tenderness: There is no abdominal tenderness.  Musculoskeletal:        General: Normal  range of motion.     Cervical back: Normal range of motion and neck supple.  Skin:    General: Skin is warm and dry.     Capillary Refill: Capillary refill takes less than 2 seconds.  Neurological:     General: No focal deficit present.     Mental Status: She is alert and oriented to person, place, and time.     Cranial Nerves: No cranial nerve deficit.     Sensory: No sensory deficit.     Coordination: Coordination normal.  Psychiatric:        Mood and Affect: Mood normal.     ED Results / Procedures / Treatments   Labs (all labs ordered are listed, but only abnormal results are displayed) Labs Reviewed  BASIC METABOLIC PANEL - Abnormal; Notable for the following components:      Result Value   Glucose, Bld 112 (*)    Creatinine, Ser 1.04 (*)    GFR calc non Af Amer 54 (*)    All other components within normal limits  PROTIME-INR - Abnormal; Notable for the following components:   Prothrombin Time 18.3 (*)    INR 1.6 (*)  All other components within normal limits  CBC WITH DIFFERENTIAL/PLATELET  MAGNESIUM    EKG None  Radiology No results found.  Procedures Procedures (including critical care time)  Medications Ordered in ED Medications  sodium chloride 0.9 % bolus 500 mL (0 mLs Intravenous Stopped 11/29/19 2050)  baclofen (LIORESAL) tablet 20 mg (20 mg Oral Given 11/29/19 1938)    ED Course  I have reviewed the triage vital signs and the nursing notes.  Pertinent labs & imaging results that were available during my care of the patient were reviewed by me and considered in my medical decision making (see chart for details).    MDM Rules/Calculators/A&P                      Persephone Schriever is a 74 year old female with history of PE on Coumadin, diabetes, peripheral neuropathy, muscle spasms who presents to the ED with lower leg spasms.  Patient with unremarkable vitals.  No fever.  Patient states spasms worse this morning in her hands and her legs.   However have now resolved since waiting.  She is due for baclofen right now.  Takes magnesium at home.  Denies any nausea, vomiting, diarrhea.  However states she may be dehydrated.  Will check basic labs to look for any electrolyte abnormalities which could be causing worsening spasms.  Will give baclofen, small fluid bolus and reevaluate.  Overall suspect chronic process.  Patient with lab work is overall unremarkable.  No significant electrolyte abnormality or kidney injury.  INR is 1.6 however patient recently just restarted taking this as she had to hold it for 5 days.  She will follow up with Coumadin clinic.  No concern for DVT or PE.  Patient felt better after baclofen IV fluids.  Discharged in good condition.  This chart was dictated using voice recognition software.  Despite best efforts to proofread,  errors can occur which can change the documentation meaning.    Final Clinical Impression(s) / ED Diagnoses Final diagnoses:  Muscle spasm    Rx / DC Orders ED Discharge Orders    None       Lennice Sites, DO 11/29/19 2211

## 2019-11-29 NOTE — ED Notes (Signed)
Unsuccessful blood draw x1 

## 2019-11-29 NOTE — ED Notes (Signed)
Unsuccessful blood draw attempt. 

## 2019-12-12 ENCOUNTER — Other Ambulatory Visit: Payer: Self-pay | Admitting: Endocrinology

## 2019-12-13 ENCOUNTER — Other Ambulatory Visit: Payer: Self-pay | Admitting: Endocrinology

## 2019-12-16 ENCOUNTER — Ambulatory Visit: Payer: Medicare Other

## 2019-12-16 ENCOUNTER — Other Ambulatory Visit (INDEPENDENT_AMBULATORY_CARE_PROVIDER_SITE_OTHER): Payer: Medicare Other

## 2019-12-16 ENCOUNTER — Other Ambulatory Visit: Payer: Self-pay

## 2019-12-16 DIAGNOSIS — E1142 Type 2 diabetes mellitus with diabetic polyneuropathy: Secondary | ICD-10-CM

## 2019-12-16 DIAGNOSIS — E7800 Pure hypercholesterolemia, unspecified: Secondary | ICD-10-CM

## 2019-12-16 DIAGNOSIS — E039 Hypothyroidism, unspecified: Secondary | ICD-10-CM

## 2019-12-16 DIAGNOSIS — E559 Vitamin D deficiency, unspecified: Secondary | ICD-10-CM

## 2019-12-16 LAB — COMPREHENSIVE METABOLIC PANEL
ALT: 25 U/L (ref 0–35)
AST: 26 U/L (ref 0–37)
Albumin: 4.2 g/dL (ref 3.5–5.2)
Alkaline Phosphatase: 82 U/L (ref 39–117)
BUN: 17 mg/dL (ref 6–23)
CO2: 30 mEq/L (ref 19–32)
Calcium: 9.4 mg/dL (ref 8.4–10.5)
Chloride: 102 mEq/L (ref 96–112)
Creatinine, Ser: 0.97 mg/dL (ref 0.40–1.20)
GFR: 68.3 mL/min (ref >60.00)
Glucose, Bld: 104 mg/dL — ABNORMAL HIGH (ref 70–99)
Potassium: 4.3 mEq/L (ref 3.5–5.1)
Sodium: 136 mEq/L (ref 135–145)
Total Bilirubin: 0.3 mg/dL (ref 0.2–1.2)
Total Protein: 7.3 g/dL (ref 6.0–8.3)

## 2019-12-16 LAB — URINALYSIS, ROUTINE W REFLEX MICROSCOPIC
Bilirubin Urine: NEGATIVE
Ketones, ur: NEGATIVE
Leukocytes,Ua: NEGATIVE
Nitrite: NEGATIVE
Specific Gravity, Urine: 1.02 (ref 1.000–1.030)
Total Protein, Urine: NEGATIVE
Urine Glucose: NEGATIVE
Urobilinogen, UA: 0.2 (ref 0.0–1.0)
pH: 5.5 (ref 5.0–8.0)

## 2019-12-16 LAB — LIPID PANEL
Cholesterol: 173 mg/dL (ref 0–200)
HDL: 83.4 mg/dL (ref >39.00)
LDL Cholesterol: 78 mg/dL (ref 0–99)
NonHDL: 89.17
Total CHOL/HDL Ratio: 2
Triglycerides: 57 mg/dL (ref 0.0–149.0)
VLDL: 11.4 mg/dL (ref 0.0–40.0)

## 2019-12-16 LAB — MICROALBUMIN / CREATININE URINE RATIO
Creatinine,U: 47.2 mg/dL
Microalb Creat Ratio: 1.5 mg/g (ref 0.0–30.0)
Microalb, Ur: <0.7 mg/dL (ref 0.0–1.9)

## 2019-12-16 LAB — TSH: TSH: 2.76 u[IU]/mL (ref 0.35–4.50)

## 2019-12-16 LAB — HEMOGLOBIN A1C: Hgb A1c MFr Bld: 6.5 % (ref 4.6–6.5)

## 2019-12-16 LAB — VITAMIN D 25 HYDROXY (VIT D DEFICIENCY, FRACTURES): VITD: 64.32 ng/mL (ref 30.00–100.00)

## 2019-12-16 LAB — T4, FREE: Free T4: 1.17 ng/dL (ref 0.60–1.60)

## 2019-12-18 ENCOUNTER — Ambulatory Visit (INDEPENDENT_AMBULATORY_CARE_PROVIDER_SITE_OTHER): Payer: Medicare Other | Admitting: General Practice

## 2019-12-18 ENCOUNTER — Ambulatory Visit (INDEPENDENT_AMBULATORY_CARE_PROVIDER_SITE_OTHER): Payer: Medicare Other | Admitting: Endocrinology

## 2019-12-18 ENCOUNTER — Encounter: Payer: Self-pay | Admitting: Endocrinology

## 2019-12-18 ENCOUNTER — Other Ambulatory Visit: Payer: Self-pay

## 2019-12-18 VITALS — BP 130/80 | HR 67 | Ht 67.5 in | Wt 220.2 lb

## 2019-12-18 DIAGNOSIS — E7800 Pure hypercholesterolemia, unspecified: Secondary | ICD-10-CM

## 2019-12-18 DIAGNOSIS — E063 Autoimmune thyroiditis: Secondary | ICD-10-CM

## 2019-12-18 DIAGNOSIS — E1142 Type 2 diabetes mellitus with diabetic polyneuropathy: Secondary | ICD-10-CM

## 2019-12-18 DIAGNOSIS — K912 Postsurgical malabsorption, not elsewhere classified: Secondary | ICD-10-CM | POA: Diagnosis not present

## 2019-12-18 DIAGNOSIS — Z7901 Long term (current) use of anticoagulants: Secondary | ICD-10-CM

## 2019-12-18 LAB — POCT INR: INR: 2.5 (ref 2.0–3.0)

## 2019-12-18 NOTE — Patient Instructions (Addendum)
Pre visit review using our clinic review tool, if applicable. No additional management support is needed unless otherwise documented below in the visit note.  Continue to take 2 tablets daily except 1 tablet on Wednesdays.  Re-check in 4 weeks.    

## 2019-12-18 NOTE — Patient Instructions (Addendum)
2 Acarbose at lunch and 1 at supper at start of meal

## 2019-12-18 NOTE — Progress Notes (Signed)
Patient ID: Kelli Martinez, female   DOB: [birth year], 74 y.o.   MRN: [id number]    Chief complaint: Followup of various chronic problems    History of Present Illness:  The patient is seen for the following problems:  1.  Postprandial hypoglycemia related to dumping syndrome   She has had postprandial hypoglycemia starting a couple of years after her gastric bypass surgery Her symptoms of blood sugars are mostly a feeling of significant weakness, some shakiness.  She will use glucose tablets or juice in order to relieve the symptoms, recently she thinks that 1 or 2 glucose tablets are usually adequate She has been to the dietitian and has been instructed on balanced low-fat meals with enough protein consistently and restricting carbohydrates including fruits   Recent history:  She has been treated with acarbose 25 mg with each meal  She did try verapamil previously but this did not help.  She is taking her Precose before meals but now taking 1 tablet at lunch and 2 at dinnertime instead of the other way around  She says she may have more tendency to hypoglycemia in the afternoons, recently had a glucose of 68 She does have some protein at lunch usually Otherwise diet can be higher in carbohydrates, sometimes having a sandwich However she thinks 2 days ago she felt hypoglycemic with shakiness and dizziness with a blood sugar of 76 in the morning prior to coming to the lab Sometimes eating only 2 meals a day and may just have yogurt in the morning   2.  SWELLING of the legs:  She has had significant problems with her legs and feet chronically  Swelling increases at the end of the day She continues to be taking Aldactone 150 mg total per day and she is using 100 mg dose and 50 mg dose daily for this in 2 separate prescriptions  Now taking 20 mg Lasix daily  gets cramping with higher doses  Periodically has more swelling but less recently  As before she  does not like to wear compression stockings because of discomfort and difficulty putting them on  Has discussed the problem with her PCP also  Renal function and potassium are consistently normal  Lab Results  Component Value Date   K 4.3 12/16/2019   Lab Results  Component Value Date   CREATININE 0.97 12/16/2019   CREATININE 1.04 (H) 11/29/2019   CREATININE 0.99 09/24/2019     PROBLEM 3:  DIABETES:  This has been mild and well controlled since her gastric bypass surgery  The A1c usually higher than expected, now 6.5  Current management, blood sugar patterns:  Her average blood sugar at home recently is only 101  Highest blood sugar 140 after dinner but she did have a sugar of 205 probably from overtreating low sugar symptoms  Her weight is starting to go back up again  Fasting readings are fairly good with Metformin taken at bedtime  Lab fasting glucose was taken after glucose tablets  She is taking METFORMIN 1000 mg at bedtime for fasting hyperglycemia Also taking Precose at each meal also to prevent reactive hypoglycemia as discussed above  Glucose readings from monitor download:  Current range 68-140 checked at different times, AVERAGING 101 Has only one high reading of 205 as above     Wt Readings from Last 3 Encounters:  12/18/19 220 lb 3.2 oz (99.9 kg)  09/24/19 219 lb (99.3 kg)  08/19/19 216 lb 3.2 oz (98.1 kg)  Lab Results  Component Value Date   HGBA1C 6.5 12/16/2019   HGBA1C 6.6 (H) 06/16/2019   HGBA1C 6.9 (H) 02/17/2019   Lab Results  Component Value Date   MICROALBUR <0.7 12/16/2019   LDLCALC 78 12/16/2019   CREATININE 0.97 12/16/2019      Other active problems: see review of systems      Allergies as of 12/18/2019      Reactions   Adhesive [tape] Rash      Medication List       Accurate as of Dec 18, 2019  9:21 AM. If you have any questions, ask your nurse or doctor.        acarbose 25 MG tablet Commonly known as:  PRECOSE Take 25 mg by mouth 3 (three) times daily with meals. Take 1 tablet by mouth at breakfast and lunch, and 2 tablets at dinner. What changed: Another medication with the same name was removed. Continue taking this medication, and follow the directions you see here. Changed by: Elayne Snare, MD   albuterol 108 (90 Base) MCG/ACT inhaler Commonly known as: ProAir HFA USE 2 INHALATIONS FOUR TIMES A DAY AS NEEDED ( AS NEEDED RESCUE INHALER) What changed:   how much to take  how to take this  when to take this  reasons to take this  additional instructions   azelastine 0.1 % nasal spray Commonly known as: ASTELIN 1-2 puffs each nostril twice daily as needed What changed:   how much to take  how to take this  when to take this  reasons to take this   baclofen 20 MG tablet Commonly known as: LIORESAL TAKE 1 TABLET DAILY What changed: when to take this   baclofen 10 MG tablet Commonly known as: LIORESAL TAKE 1 TABLET TWICE A DAY What changed: Another medication with the same name was changed. Make sure you understand how and when to take each.   Caltrate 600+D 600-400 MG-UNIT tablet Generic drug: Calcium Carbonate-Vitamin D Take 1 tablet by mouth daily.   clobetasol cream 0.05 % Commonly known as: TEMOVATE APPLY 1 APPLICATION TOPICALLY TWICE A DAY What changed: See the new instructions.   diazepam 10 MG Gel Commonly known as: DIASTAT ACUDIAL Place 5 mg rectally once as needed (cramping).   ferrous sulfate 325 (65 FE) MG tablet Take 325 mg by mouth daily.   fluticasone 50 MCG/ACT nasal spray Commonly known as: FLONASE USE 2 SPRAYS IN EACH NOSTRIL DAILY What changed:   when to take this  reasons to take this   Fluticasone-Salmeterol 100-50 MCG/DOSE Aepb Commonly known as: Advair Diskus USE 1 INHALATION EVERY 12 HOURS, RINSE MOUTH What changed:   how much to take  how to take this  when to take this   FreeStyle Freedom Lite w/Device Kit Use as  directed to check blood sugar once daily.   freestyle lancets USE AS INSTRUCTED TO CHECK BLOOD SUGAR ONCE DAILY   FREESTYLE LITE test strip Generic drug: glucose blood USE AS INSTRUCTED TO CHECK BLOOD SUGAR ONCE DAILY   furosemide 20 MG tablet Commonly known as: LASIX Take 2 tablets (40 mg total) by mouth daily for 5 days. What changed: Another medication with the same name was removed. Continue taking this medication, and follow the directions you see here. Changed by: Elayne Snare, MD   gabapentin 300 MG capsule Commonly known as: NEURONTIN TAKE 2 CAPSULES THREE TIMES A DAY What changed: See the new instructions.   HYDROcodone-acetaminophen 5-325 MG tablet Commonly known as:  Norco Take 1 tablet by mouth 2 (two) times daily as needed for moderate pain. What changed:   how much to take  when to take this  reasons to take this   Levoxyl 125 MCG tablet Generic drug: levothyroxine Take 125 mcg by mouth See admin instructions. Take 1 tablet (125 mcg) by mouth daily on Monday-Friday   metFORMIN 500 MG tablet Commonly known as: GLUCOPHAGE TAKE 2 TABLETS ONCE DAILY AT BEDTIME What changed:   how much to take  how to take this  when to take this  additional instructions   methylcellulose 1 % ophthalmic solution Commonly known as: ARTIFICIAL TEARS Place 1 drop into both eyes as needed. Dry eyes   omeprazole 20 MG capsule Commonly known as: PRILOSEC TAKE 1 CAPSULE DAILY   potassium chloride SA 20 MEQ tablet Commonly known as: KLOR-CON Take 1 tablet (20 mEq total) by mouth daily for 5 days.   pravastatin 40 MG tablet Commonly known as: PRAVACHOL TAKE 1 TABLET DAILY   SLOW-MAG PO Take 500 mg by mouth at bedtime.   spironolactone 50 MG tablet Commonly known as: ALDACTONE Take 50 mg by mouth 2 (two) times daily. Take 2 tablets by mouth daily in the morning and 1 tablet in the evening. What changed: Another medication with the same name was removed. Continue  taking this medication, and follow the directions you see here. Changed by: Elayne Snare, MD   temazepam 15 MG capsule Commonly known as: RESTORIL 1-2 for sleep as needed What changed:   how much to take  how to take this  when to take this  reasons to take this   vitamin B-12 500 MCG tablet Commonly known as: CYANOCOBALAMIN Take 500 mcg by mouth daily.   Vitamin D (Ergocalciferol) 1.25 MG (50000 UNIT) Caps capsule Commonly known as: DRISDOL TAKE 1 CAPSULE EVERY FRIDAY What changed: See the new instructions.   warfarin 5 MG tablet Commonly known as: COUMADIN Take as directed by the anticoagulation clinic. If you are unsure how to take this medication, talk to your nurse or doctor. Original instructions: Take 2 tablets (10 mg total) by mouth daily. TAKE 2 TABLETS BY MOUTH ONCE DAILY, EXCEPT WEDNESDAYS. ON Wednesday, TAKE 1 TABLET DAILY. What changed:   how much to take  when to take this  additional instructions       Allergies:  Allergies  Allergen Reactions  . Adhesive [Tape] Rash    Past Medical History:  Diagnosis Date  . Allergy   . Anemia   . Arthritis   . Asthma   . Back pain, chronic    GETS INJECTIONS IN BACK  . Blind right eye    hemorrhage  . Blood transfusion   . Cancer ([place])    breast 1994  . Clotting disorder ([place])   . Diabetes mellitus without complication ([place])    takes precose  . Elevated liver enzymes   . Gallstones   . GERD (gastroesophageal reflux disease)   . HX: breast cancer   . Hyperlipidemia   . Hypertension   . Hypothyroid   . Lymphedema of arm    RT  . Morbid obesity ([place])   . OSA (obstructive sleep apnea)    has not used in 2 years-lost weight  . Osteoporosis   . Peripheral neuropathy    on gabapentin  . PONV (postoperative nausea and vomiting)   . Psoriasis   . Pulmonary embolism ([place]) 1998 / 1994 /1968  . Syncope and collapse 2011  due to anemia    Past Surgical History:  Procedure Laterality Date  .  ABDOMINAL HYSTERECTOMY    . BONE MARROW TRANSPLANT  1994  . CARDIOVASCULAR STRESS TEST  05/23/2005   EF 53%  . carpal tunnell     bil  . cataracts    . CHOLECYSTECTOMY  05/10/2012   Procedure: LAPAROSCOPIC CHOLECYSTECTOMY WITH INTRAOPERATIVE CHOLANGIOGRAM;  Surgeon: Pedro Earls, MD;  Location: [facility];  Service: General;  Laterality: N/A;  Laparoscopic Cholecystectomy with Intraoperative Cholangiogram  . GASTRIC BYPASS  2011   bariatric surgery  . HIATAL HERNIA REPAIR    . IVC filter     recurrent DVT  . KNEE ARTHROTOMY  1998  . MASS EXCISION Right 11/24/2016   Procedure: EXCISION RIGHT AXILLARY LESION AND CHEST WALL LESION;  Surgeon: Johnathan Hausen, MD;  Location: [facility];  Service: General;  Laterality: Right;  . MASTECTOMY     Right  . US ECHOCARDIOGRAPHY  10/27/08   EF 55-60%    Family History  Problem Relation Age of Onset  . Sudden death Mother        car accident  . Cancer Father 67       lung  . Cancer Sister 63       lung cancer  . Asthma Daughter     Social History:  reports that she quit smoking about 41 years ago. Her smoking use included cigarettes. She has a 5.00 pack-year smoking history. She has never used smokeless tobacco. She reports that she does not drink alcohol or use drugs.  Review of Systems   ANTICOAGULATION: Her Coumadin has been followed regularly in the clinic INR results as follows  Lab Results  Component Value Date   INR 2.5 12/18/2019   INR 1.6 (H) 11/29/2019   INR 1.1 (A) 11/20/2019    ABNORMAL liver functions: She has had these before transiently and no etiology found Not related to statin  Lab Results  Component Value Date   ALT 25 12/16/2019   ALT 25 01/19/2016     Neuropathy with persistent burning, mild numbness and lower leg pain:  She gets relief with nortriptyline and gabapentin  MUSCLE cramps/spasms: She is being treated with 10 mg baclofen during the day twice daily and 20 mg at bedtime Not  sure if she has been followed by neurologist She now says that she has periodic cramps during the night around 3-4 AM requiring an extra dose of baclofen 10 mg  She also uses tonic water, mustard and magnesium supplements    Lymphedema right arm, secondary to  breast surgery, using  elastic compression stocking with relief   History of mild hypothyroidism: Has been present for several years and minimally symptomatic usually  She feels fairly good overall She was told to take the 125 mcg daily on weekdays only and skip the weekends Usually following this routine Still not ready to switch her prescription since she has a large supply of the 125 levothyroxine dose She will need to be on 88 mcg daily on the next prescription   Lab Results  Component Value Date   TSH 2.76 12/16/2019   TSH 2.91 08/14/2019   TSH 0.68 06/16/2019   FREET4 1.17 12/16/2019   FREET4 1.12 08/14/2019   FREET4 1.10 06/16/2019     Hypercholesterolemia: LDL has been at target with pravastatin Labs as below, ALT now consistently normal  Lipid levels:  Lab Results  Component Value Date   CHOL 173 12/16/2019  HDL 83.40 12/16/2019   LDLCALC 78 12/16/2019   TRIG 57.0 12/16/2019   CHOLHDL 2 12/16/2019   Lab Results  Component Value Date   ALT 25 12/16/2019   ALT 25 01/19/2016    Blood pressure controlled with diuretics including Aldactone   BP Readings from Last 3 Encounters:  12/18/19 130/80  11/29/19 118/64  09/24/19 128/80     LABS:  Anti-coag visit on 12/18/2019  Component Date Value Ref Range Status  . INR 12/18/2019 2.5  2.0 - 3.0 Final  Lab on 12/16/2019  Component Date Value Ref Range Status  . VITD 12/16/2019 64.32  30.00 - 100.00 ng/mL Final  . Free T4 12/16/2019 1.17  0.60 - 1.60 ng/dL Final   Comment: Specimens from patients who are undergoing biotin therapy and /or ingesting biotin supplements may contain high levels of biotin.  The higher biotin concentration in these  specimens interferes with this Free T4 assay.  Specimens that contain high levels  of biotin may cause false high results for this Free T4 assay.  Please interpret results in light of the total clinical presentation of the patient.    Mar[place] Kitchen TSH 12/16/2019 2.76  0.35 - 4.50 uIU/mL Final  . Color, Urine 12/16/2019 YELLOW  Yellow;Lt. Yellow;Straw;Dark Yellow;Amber;Green;Red;Brown Final  . APPearance 12/16/2019 CLEAR  Clear;Turbid;Slightly Cloudy;Cloudy Final  . Specific Gravity, Urine 12/16/2019 1.020  1.000 - 1.030 Final  . pH 12/16/2019 5.5  5.0 - 8.0 Final  . Total Protein, Urine 12/16/2019 NEGATIVE  Negative Final  . Urine Glucose 12/16/2019 NEGATIVE  Negative Final  . Ketones, ur 12/16/2019 NEGATIVE  Negative Final  . Bilirubin Urine 12/16/2019 NEGATIVE  Negative Final  . Hgb urine dipstick 12/16/2019 SMALL* Negative Final  . Urobilinogen, UA 12/16/2019 0.2  0.0 - 1.0 Final  . Leukocytes,Ua 12/16/2019 NEGATIVE  Negative Final  . Nitrite 12/16/2019 NEGATIVE  Negative Final  . WBC, UA 12/16/2019 0-2/hpf  0-2/hpf Final  . RBC / HPF 12/16/2019 0-2/hpf  0-2/hpf Final  . Squamous Epithelial / LPF 12/16/2019 Rare(0-4/hpf)  Rare(0-4/hpf) Final  . Microalb, Ur 12/16/2019 <0.7  0.0 - 1.9 mg/dL Final  . Creatinine,U 12/16/2019 47.2  mg/dL Final  . Microalb Creat Ratio 12/16/2019 1.5  0.0 - 30.0 mg/g Final  . Cholesterol 12/16/2019 173  0 - 200 mg/dL Final   ATP III Classification       Desirable:  < 200 mg/dL               Borderline High:  200 - 239 mg/dL          High:  > = 240 mg/dL  . Triglycerides 12/16/2019 57.0  0.0 - 149.0 mg/dL Final   Normal:  <150 mg/dLBorderline High:  150 - 199 mg/dL  . HDL 12/16/2019 83.40  >39.00 mg/dL Final  . VLDL 12/16/2019 11.4  0.0 - 40.0 mg/dL Final  . LDL Cholesterol 12/16/2019 78  0 - 99 mg/dL Final  . Total CHOL/HDL Ratio 12/16/2019 2   Final                  Men          Women1/2 Average Risk     3.4          3.3Average Risk          5.0          4.42X  Average Risk          9.6  7.13X Average Risk          15.0          11.0                      . NonHDL 12/16/2019 89.17   Final   NOTE:  Non-HDL goal should be 30 mg/dL higher than patient's LDL goal (i.e. LDL goal of < 70 mg/dL, would have non-HDL goal of < 100 mg/dL)  . Sodium 12/16/2019 136  135 - 145 mEq/L Final  . Potassium 12/16/2019 4.3  3.5 - 5.1 mEq/L Final  . Chloride 12/16/2019 102  96 - 112 mEq/L Final  . CO2 12/16/2019 30  19 - 32 mEq/L Final  . Glucose, Bld 12/16/2019 104* 70 - 99 mg/dL Final  . BUN 12/16/2019 17  6 - 23 mg/dL Final  . Creatinine, Ser 12/16/2019 0.97  0.40 - 1.20 mg/dL Final  . Total Bilirubin 12/16/2019 0.3  0.2 - 1.2 mg/dL Final  . Alkaline Phosphatase 12/16/2019 82  39 - 117 U/L Final  . AST 12/16/2019 26  0 - 37 U/L Final  . ALT 12/16/2019 25  0 - 35 U/L Final  . Total Protein 12/16/2019 7.3  6.0 - 8.3 g/dL Final  . Albumin 12/16/2019 4.2  3.5 - 5.2 g/dL Final  . GFR 12/16/2019 68.30  >60.00 mL/min Final  . Calcium 12/16/2019 9.4  8.4 - 10.5 mg/dL Final  . Hgb A1c MFr Bld 12/16/2019 6.5  4.6 - 6.5 % Final   Glycemic Control Guidelines for People with Diabetes:Non Diabetic:  <6%Goal of Therapy: <7%Additional Action Suggested:  >8%   Admission on 11/29/2019, Discharged on 11/29/2019  Component Date Value Ref Range Status  . WBC 11/29/2019 7.4  4.0 - 10.5 K/uL Final  . RBC 11/29/2019 4.36  3.87 - 5.11 MIL/uL Final  . Hemoglobin 11/29/2019 12.2  12.0 - 15.0 g/dL Final  . HCT 11/29/2019 38.9  36.0 - 46.0 % Final  . MCV 11/29/2019 89.2  80.0 - 100.0 fL Final  . MCH 11/29/2019 28.0  26.0 - 34.0 pg Final  . MCHC 11/29/2019 31.4  30.0 - 36.0 g/dL Final  . RDW 11/29/2019 14.5  11.5 - 15.5 % Final  . Platelets 11/29/2019 156  150 - 400 K/uL Final  . nRBC 11/29/2019 0.0  0.0 - 0.2 % Final  . Neutrophils Relative % 11/29/2019 79  % Final  . Neutro Abs 11/29/2019 5.8  1.7 - 7.7 K/uL Final  . Lymphocytes Relative 11/29/2019 16  % Final  . Lymphs Abs  11/29/2019 1.2  0.7 - 4.0 K/uL Final  . Monocytes Relative 11/29/2019 4  % Final  . Monocytes Absolute 11/29/2019 0.3  0.1 - 1.0 K/uL Final  . Eosinophils Relative 11/29/2019 0  % Final  . Eosinophils Absolute 11/29/2019 0.0  0.0 - 0.5 K/uL Final  . Basophils Relative 11/29/2019 1  % Final  . Basophils Absolute 11/29/2019 0.1  0.0 - 0.1 K/uL Final  . Immature Granulocytes 11/29/2019 0  % Final  . Abs Immature Granulocytes 11/29/2019 0.02  0.00 - 0.07 K/uL Final   Performed at [facility], [place] [address], [place], [place] [address]  . Sodium 11/29/2019 135  135 - 145 mmol/L Final  . Potassium 11/29/2019 4.8  3.5 - 5.1 mmol/L Final  . Chloride 11/29/2019 99  98 - 111 mmol/L Final  . CO2 11/29/2019 28  22 - 32 mmol/L Final  . Glucose, Bld 11/29/2019 112*  70 - 99 mg/dL Final   Glucose reference range applies only to samples taken after fasting for at least 8 hours.  . BUN 11/29/2019 22  8 - 23 mg/dL Final  . Creatinine, Ser 11/29/2019 1.04* 0.44 - 1.00 mg/dL Final  . Calcium 11/29/2019 9.8  8.9 - 10.3 mg/dL Final  . GFR calc non Af Amer 11/29/2019 54* >60 mL/min Final  . GFR calc Af Amer 11/29/2019 >60  >60 mL/min Final  . Anion gap 11/29/2019 8  5 - 15 Final   Performed at [facility], [place] [address], [place], [place] [address]  . Magnesium 11/29/2019 2.2  1.7 - 2.4 mg/dL Final   Performed at [facility] [address] [place], [place] [address]  . Prothrombin Time 11/29/2019 18.3* 11.4 - 15.2 seconds Final  . INR 11/29/2019 1.6* 0.8 - 1.2 Final   Comment: (NOTE) INR goal varies based on device and disease states. Performed at [facility], [place] [address], [place], [place] [address]   Anti-coag visit on 11/20/2019  Component Date Value Ref Range Status  . INR 11/20/2019 1.1* 2.0 - 3.0 Final     EXAM:  BP 130/80 (BP Location: Left Arm, Patient Position: Sitting, Cuff Size: Normal)   Pulse 67   Ht 5'  7.5" (1.715 m)   Wt 220 lb 3.2 oz (99.9 kg)   SpO2 97%   BMI 33.98 kg/m      Assessment/Plan:   1.  EDEMA: This is related to venous insufficiency and idiopathic fluid retention She is on  Aldactone and also on Lasix 20 mg with overall fair control She will try to continue elevation of her feet when sitting   2. Reactive hypoglycemia secondary to gastrointestinal surgery: Symptoms have been mostly controlled  Since she is having more symptoms in the afternoon she will need to switch her doses of acarbose and take 2 tablets at lunch and 1 at dinner instead of the other way around Also make sure she has less carbohydrate and more protein with every meal especially lunch  3. Mild diabetes: A1c 6.6 although blood sugars at home are only averaging 101  Highest postprandial reading 140 She will continue metformin 1055m at bedtime  Microalbumin normal  4.  MUSCLE cramps: She is still having some spasms and relieved by baclofen but she thinks she needs up to 50 mg a day for control We will message her PCP to start managing her general medical problems and prescriptions  LIPIDS: Well controlled on pravastatin 40 mg daily    5.  Primary hypothyroidism: She is taking 125 mcg, 5 days a week and TSH is 2.9 Since she has a large supply of the 125 tablets she will continue the same regimen but let uKoreaknow when she needs a new refill 88     There are no Patient Instructions on file for this visit.  AElayne Snare5/20/2021, 9:21 AM

## 2019-12-18 NOTE — Progress Notes (Signed)
Medical screening examination/treatment/procedure(s) were performed by non-physician practitioner and as supervising physician I was immediately available for consultation/collaboration. I agree with above. Jenelle Drennon, MD   

## 2019-12-22 ENCOUNTER — Other Ambulatory Visit: Payer: Self-pay | Admitting: Family Medicine

## 2019-12-22 DIAGNOSIS — E2839 Other primary ovarian failure: Secondary | ICD-10-CM

## 2019-12-24 ENCOUNTER — Ambulatory Visit
Admission: RE | Admit: 2019-12-24 | Discharge: 2019-12-24 | Disposition: A | Discharge status: Home / Self Care | Payer: Medicare Other | Source: Ambulatory Visit | Attending: Family Medicine | Admitting: Family Medicine

## 2019-12-24 ENCOUNTER — Other Ambulatory Visit: Payer: Self-pay

## 2019-12-24 DIAGNOSIS — E2839 Other primary ovarian failure: Secondary | ICD-10-CM

## 2019-12-30 ENCOUNTER — Other Ambulatory Visit: Payer: Self-pay | Admitting: Neurology

## 2020-01-15 ENCOUNTER — Other Ambulatory Visit: Payer: Self-pay

## 2020-01-15 ENCOUNTER — Ambulatory Visit (INDEPENDENT_AMBULATORY_CARE_PROVIDER_SITE_OTHER): Payer: Medicare Other | Admitting: General Practice

## 2020-01-15 DIAGNOSIS — Z7901 Long term (current) use of anticoagulants: Secondary | ICD-10-CM | POA: Diagnosis not present

## 2020-01-15 LAB — POCT INR: INR: 2 (ref 2.0–3.0)

## 2020-01-15 NOTE — Progress Notes (Signed)
Medical screening examination/treatment/procedure(s) were performed by non-physician practitioner and as supervising physician I was immediately available for consultation/collaboration. I agree with above. Johney Perotti, MD   

## 2020-01-15 NOTE — Patient Instructions (Signed)
Pre visit review using our clinic review tool, if applicable. No additional management support is needed unless otherwise documented below in the visit note.  Continue to take 2 tablets daily except 1 tablet on Wednesdays.  Re-check in 4 weeks.

## 2020-02-12 ENCOUNTER — Other Ambulatory Visit: Payer: Self-pay

## 2020-02-12 ENCOUNTER — Ambulatory Visit (INDEPENDENT_AMBULATORY_CARE_PROVIDER_SITE_OTHER): Payer: Medicare Other | Admitting: General Practice

## 2020-02-12 DIAGNOSIS — Z7901 Long term (current) use of anticoagulants: Secondary | ICD-10-CM | POA: Diagnosis not present

## 2020-02-12 LAB — POCT INR: INR: 1.8 — AB (ref 2.0–3.0)

## 2020-02-12 NOTE — Progress Notes (Signed)
Medical screening examination/treatment/procedure(s) were performed by non-physician practitioner and as supervising physician I was immediately available for consultation/collaboration. I agree with above. Radley Teston, MD   

## 2020-02-12 NOTE — Patient Instructions (Addendum)
Pre visit review using our clinic review tool, if applicable. No additional management support is needed unless otherwise documented below in the visit note.  Take 3 tablets today (15 mg) and then continue to take 2 tablets daily except 1 tablet on Wednesdays.  Re-check in 4 weeks.

## 2020-02-28 ENCOUNTER — Other Ambulatory Visit: Payer: Self-pay | Admitting: Endocrinology

## 2020-02-28 DIAGNOSIS — I872 Venous insufficiency (chronic) (peripheral): Secondary | ICD-10-CM

## 2020-02-29 NOTE — Telephone Encounter (Signed)
Forwarding for your approval

## 2020-03-04 ENCOUNTER — Ambulatory Visit (INDEPENDENT_AMBULATORY_CARE_PROVIDER_SITE_OTHER): Payer: Medicare Other | Admitting: General Practice

## 2020-03-04 ENCOUNTER — Other Ambulatory Visit: Payer: Self-pay

## 2020-03-04 DIAGNOSIS — Z7901 Long term (current) use of anticoagulants: Secondary | ICD-10-CM | POA: Diagnosis not present

## 2020-03-04 LAB — POCT INR: INR: 2.1 (ref 2.0–3.0)

## 2020-03-04 NOTE — Patient Instructions (Signed)
Pre visit review using our clinic review tool, if applicable. No additional management support is needed unless otherwise documented below in the visit note.  Take 3 tablets today (15 mg) and then continue to take 2 tablets daily except 1 tablet on Wednesdays and alternate 2 tablets on Wed every other week.  Re-check in 4 weeks.

## 2020-03-04 NOTE — Progress Notes (Signed)
Medical screening examination/treatment/procedure(s) were performed by non-physician practitioner and as supervising physician I was immediately available for consultation/collaboration. I agree with above. Jernee Murtaugh, MD   

## 2020-03-05 ENCOUNTER — Telehealth: Payer: Self-pay | Admitting: *Deleted

## 2020-03-05 NOTE — Telephone Encounter (Signed)
Received RX clarification request fax from [facility] for furosemide 20 mg. Request sent back advising RX needs to be filled by Dr. Volanda Napoleon.

## 2020-03-16 ENCOUNTER — Other Ambulatory Visit: Payer: Self-pay | Admitting: Neurology

## 2020-03-17 ENCOUNTER — Other Ambulatory Visit: Payer: Self-pay | Admitting: Endocrinology

## 2020-03-19 ENCOUNTER — Ambulatory Visit: Payer: Medicare Other | Admitting: Family Medicine

## 2020-03-22 ENCOUNTER — Other Ambulatory Visit: Payer: Self-pay

## 2020-03-22 ENCOUNTER — Ambulatory Visit (INDEPENDENT_AMBULATORY_CARE_PROVIDER_SITE_OTHER): Payer: Medicare Other | Admitting: Family Medicine

## 2020-03-22 ENCOUNTER — Encounter: Payer: Self-pay | Admitting: Family Medicine

## 2020-03-22 VITALS — BP 118/82 | HR 64 | Temp 98.3°F | Wt 222.0 lb

## 2020-03-22 DIAGNOSIS — R600 Localized edema: Secondary | ICD-10-CM

## 2020-03-22 DIAGNOSIS — M71332 Other bursal cyst, left wrist: Secondary | ICD-10-CM

## 2020-03-22 DIAGNOSIS — I890 Lymphedema, not elsewhere classified: Secondary | ICD-10-CM

## 2020-03-22 DIAGNOSIS — E1142 Type 2 diabetes mellitus with diabetic polyneuropathy: Secondary | ICD-10-CM

## 2020-03-22 DIAGNOSIS — L309 Dermatitis, unspecified: Secondary | ICD-10-CM

## 2020-03-22 DIAGNOSIS — M62838 Other muscle spasm: Secondary | ICD-10-CM

## 2020-03-22 NOTE — Patient Instructions (Signed)
Leg Cramps Leg cramps occur when one or more muscles tighten and you have no control over this tightening (involuntary muscle contraction). Muscle cramps can develop in any muscle, but the most common place is in the calf muscles of the leg. Those cramps can occur during exercise or when you are at rest. Leg cramps are painful, and they may last for a few seconds to a few minutes. Cramps may return several times before they finally stop. Usually, leg cramps are not caused by a serious medical problem. In many cases, the cause is not known. Some common causes include:  Excessive physical effort (overexertion), such as during intense exercise.  Overuse from repetitive motions, or doing the same thing over and over.  Staying in a certain position for a long period of time.  Improper preparation, form, or technique while performing a sport or an activity.  Dehydration.  Injury.  Side effects of certain medicines.  Abnormally low levels of minerals in your blood (electrolytes), especially potassium and calcium. This could result from: ? Pregnancy. ? Taking diuretic medicines. Follow these instructions at home: Eating and drinking  Drink enough fluid to keep your urine pale yellow. Staying hydrated may help prevent cramps.  Eat a healthy diet that includes plenty of nutrients to help your muscles function. A healthy diet includes fruits and vegetables, lean protein, whole grains, and low-fat or nonfat dairy products. Managing pain, stiffness, and swelling      Try massaging, stretching, and relaxing the affected muscle. Do this for several minutes at a time.  If directed, put ice on areas that are sore or painful after a cramp: ? Put ice in a plastic bag. ? Place a towel between your skin and the bag. ? Leave the ice on for 20 minutes, 2-3 times a day.  If directed, apply heat to muscles that are tense or tight. Do this before you exercise, or as often as told by your health care  provider. Use the heat source that your health care provider recommends, such as a moist heat pack or a heating pad. ? Place a towel between your skin and the heat source. ? Leave the heat on for 20-30 minutes. ? Remove the heat if your skin turns bright red. This is especially important if you are unable to feel pain, heat, or cold. You may have a greater risk of getting burned.  Try taking hot showers or baths to help relax tight muscles. General instructions  If you are having frequent leg cramps, avoid intense exercise for several days.  Take over-the-counter and prescription medicines only as told by your health care provider.  Keep all follow-up visits as told by your health care provider. This is important. Contact a health care provider if:  Your leg cramps get more severe or more frequent, or they do not improve over time.  Your foot becomes cold, numb, or blue. Summary  Muscle cramps can develop in any muscle, but the most common place is in the calf muscles of the leg.  Leg cramps are painful, and they may last for a few seconds to a few minutes.  Usually, leg cramps are not caused by a serious medical problem. Often, the cause is not known.  Stay hydrated and take over-the-counter and prescription medicines only as told by your health care provider. This information is not intended to replace advice given to you by your health care provider. Make sure you discuss any questions you have with your health care  provider. Document Revised: 06/29/2017 Document Reviewed: 04/26/2017 Elsevier Patient Education  2020 [place].  Lymphedema  Lymphedema is swelling that is caused by the abnormal collection of lymph in the tissues under the skin. Lymph is fluid from the tissues in your body that is removed through the lymphatic system. This system is part of your body's defense system (immune system) and includes lymph nodes and lymph vessels. The lymph vessels collect and carry  the excess fluid, fats, proteins, and wastes from the tissues of the body to the bloodstream. This system also works to clean and remove bacteria and waste products from the body. Lymphedema occurs when the lymphatic system is blocked. When the lymph vessels or lymph nodes are blocked or damaged, lymph does not drain properly. This causes an abnormal buildup of lymph, which leads to swelling in the affected area. This may include the trunk area, or an arm or leg. Lymphedema cannot be cured by medicines, but various methods can be used to help reduce the swelling. There are two types of lymphedema: primary lymphedema and secondary lymphedema. What are the causes? The cause of this condition depends on the type of lymphedema that you have.  Primary lymphedema is caused by the absence of lymph vessels or having abnormal lymph vessels at birth.  Secondary lymphedema occurs when lymph vessels are blocked or damaged. Secondary lymphedema is more common. Common causes of lymph vessel blockage include: ? Skin infection, such as cellulitis. ? Infection by parasites (filariasis). ? Injury. ? Radiation therapy. ? Cancer. ? Formation of scar tissue. ? Surgery. What are the signs or symptoms? Symptoms of this condition include:  Swelling of the arm or leg.  A heavy or tight feeling in the arm or leg.  Swelling of the feet, toes, or fingers. Shoes or rings may fit more tightly than before.  Redness of the skin over the affected area.  Limited movement of the affected limb.  Sensitivity to touch or discomfort in the affected limb. How is this diagnosed? This condition may be diagnosed based on:  Your symptoms and medical history.  A physical exam.  Bioimpedance spectroscopy. In this test, painless electrical currents are used to measure fluid levels in your body.  Imaging tests, such as: ? Lymphoscintigraphy. In this test, a low dose of a radioactive substance is injected to trace the flow  of lymph through the lymph vessels. ? MRI. ? CT scan. ? Duplex ultrasound. This test uses sound waves to produce images of the vessels and the blood flow on a screen. ? Lymphangiography. In this test, a contrast dye is injected into the lymph vessel to help show blockages. How is this treated? Treatment for this condition may depend on the cause of your lymphedema. Treatment may include:  Complete decongestive therapy (CDT). This is done by a certified lymphedema therapist to reduce fluid congestion. This therapy includes: ? Manual lymph drainage. This is a special massage technique that promotes lymph drainage out of a limb. ? Skin care. ? Compression wrapping of the affected area. ? Specific exercises. Certain exercises can help fluid move out of the affected limb.  Compression. Various methods may be used to apply pressure to the affected limb to reduce the swelling. They include: ? Wearing compression stockings or sleeves on the affected limb. ? Wrapping the affected limb with special bandages.  Surgery. This is usually done for severe cases only. For example, surgery may be done if you have trouble moving the limb or if the swelling  does not get better with other treatments. If an underlying condition is causing the lymphedema, treatment for that condition will be done. For example, antibiotic medicines may be used to treat an infection. Follow these instructions at home: Self-care  The affected area is more likely to become injured or infected. Take these steps to help prevent infection: ? Keep the affected area clean and dry. ? Use approved creams or lotions to keep the skin moisturized. ? Protect your skin from cuts:  Use gloves while cooking or gardening.  Do not walk barefoot.  If you shave the affected area, use an Copy.  Do not wear tight clothes, shoes, or jewelry.  Eat a healthy diet that includes a lot of fruits and vegetables. Activity  Exercise  regularly as directed by your health care provider.  Do not sit with your legs crossed.  When possible, keep the affected limb raised (elevated) above the level of your heart.  Avoid carrying things with an arm that is affected by lymphedema. General instructions  Wear compression stockings or sleeves as told by your health care provider.  Note any changes in size of the affected limb. You may be instructed to take regular measurements and keep track of them.  Take over-the-counter and prescription medicines only as told by your health care provider.  If you were prescribed an antibiotic medicine, take or apply it as told by your health care provider. Do not stop using the antibiotic even if you start to feel better.  Do not use heating pads or ice packs over the affected area.  Avoid having blood draws, IV insertions, or blood pressure checked on the affected limb.  Keep all follow-up visits as told by your health care provider. This is important. Contact a health care provider if you:  Continue to have swelling in your limb.  Have a cut that does not heal.  Have redness or pain in the affected area. Get help right away if you:  Have new swelling in your limb that comes on suddenly.  Develop purplish spots, rash or sores (lesions) on your affected limb.  Have shortness of breath.  Have a fever or chills. Summary  Lymphedema is swelling that is caused by the abnormal collection of lymph in the tissues under the skin.  Lymph is fluid from the tissues in your body that is removed through the lymphatic system. This system collects and carries excess fluid, fats, proteins, and wastes from the tissues of the body to the bloodstream.  Lymphedema causes swelling, pain, and redness in the affected area. This may include the trunk area, or an arm or leg.  Treatment for this condition may depend on the cause of your lymphedema. Treatment may include complete decongestive therapy  (CDT), compression methods, surgery, or treating the underlying cause. This information is not intended to replace advice given to you by your health care provider. Make sure you discuss any questions you have with your health care provider. Document Revised: 07/30/2017 Document Reviewed: 07/30/2017 Elsevier Patient Education  [place].  Diabetic Neuropathy Diabetic neuropathy refers to nerve damage that is caused by diabetes (diabetes mellitus). Over time, people with diabetes can develop nerve damage throughout the body. There are several types of diabetic neuropathy:  Peripheral neuropathy. This is the most common type of diabetic neuropathy. It causes damage to nerves that carry signals between the spinal cord and other parts of the body (peripheral nerves). This usually affects nerves in the feet and legs  first, and may eventually affect the hands and arms. The damage affects the ability to sense touch or temperature.  Autonomic neuropathy. This type causes damage to nerves that control involuntary functions (autonomic nerves). These nerves carry signals that control: ? Heartbeat. ? Body temperature. ? Blood pressure. ? Urination. ? Digestion. ? Sweating. ? Sexual function. ? Response to changing blood sugar (glucose) levels.  Focal neuropathy. This type of nerve damage affects one area of the body, such as an arm, a leg, or the face. The injury may involve one nerve or a small group of nerves. Focal neuropathy can be painful and unpredictable, and occurs most often in older adults with diabetes. This often develops suddenly, but usually improves over time and does not cause long-term problems.  Proximal neuropathy. This type of nerve damage affects the nerves of the thighs, hips, buttocks, or legs. It causes severe pain, weakness, and muscle death (atrophy), usually in the thigh muscles. It is more common among older men and people who have type 2 diabetes. The length of  recovery time may vary. What are the causes? Peripheral, autonomic, and focal neuropathies are caused by diabetes that is not well controlled with treatment. The cause of proximal neuropathy is not known, but it may be caused by inflammation related to uncontrolled blood glucose levels. What are the signs or symptoms? Peripheral neuropathy Peripheral neuropathy develops slowly over time. When the nerves of the feet and legs no longer work, you may experience:  Burning, stabbing, or aching pain in the legs or feet.  Pain or cramping in the legs or feet.  Loss of feeling (numbness) and inability to feel pressure or pain in the feet. This can lead to: ? Thick calluses or sores on areas of constant pressure. ? Ulcers. ? Reduced ability to feel temperature changes.  Foot deformities.  Muscle weakness.  Loss of balance or coordination. Autonomic neuropathy The symptoms of autonomic neuropathy vary depending on which nerves are affected. Symptoms may include:  Problems with digestion, such as: ? Nausea or vomiting. ? Poor appetite. ? Bloating. ? Diarrhea or constipation. ? Trouble swallowing. ? Losing weight without trying to.  Problems with the heart, blood and lungs, such as: ? Dizziness, especially when standing up. ? Fainting. ? Shortness of breath. ? Irregular heartbeat.  Bladder problems, such as: ? Trouble starting or stopping urination. ? Leaking urine. ? Trouble emptying the bladder. ? Urinary tract infections (UTIs).  Problems with other body functions, such as: ? Sweat. You may sweat too much or too little. ? Temperature. You might get hot easily. Or, you might feel cold more than usual. ? Sexual function. Men may not be able to get or maintain an erection. Women may have vaginal dryness and difficulty with arousal. Focal neuropathy Symptoms affect only one area of the body. Common symptoms include:  Numbness.  Tingling.  Burning pain.  Prickling  feeling.  Very sensitive skin.  Weakness.  Inability to move (paralysis).  Muscle twitching.  Muscles getting smaller (wasting).  Poor coordination.  Double or blurred vision. Proximal neuropathy  Sudden, severe pain in the hip, thigh, or buttocks. Pain may spread from the back into the legs (sciatica).  Pain and numbness in the arms and legs.  Tingling.  Loss of bladder control or bowel control.  Weakness and wasting of thigh muscles.  Difficulty getting up from a seated position.  Abdominal swelling.  Unexplained weight loss. How is this diagnosed? Diagnosis usually involves reviewing your medical history and  any symptoms you have. Diagnosis varies depending on the type of neuropathy your health care provider suspects. Peripheral neuropathy Your health care provider will check areas that are affected by your nervous system (neurologic exam), such as your reflexes, how you move, and what you can feel. You may have other tests, such as:  Blood tests.  Removal and examination of fluid that surrounds the spinal cord (lumbar puncture).  CT scan.  MRI.  A test to check the nerves that control muscles (electromyogram, EMG).  Tests of how quickly messages pass through your nerves (nerve conduction velocity tests).  Removal of a small piece of nerve to be examined under a microscope (biopsy). Autonomic neuropathy You may have tests, such as:  Tests to measure your blood pressure and heart rate. This may include monitoring you while you are safely secured to an exam table that moves you from a lying position to an upright position (table tilt test).  Breathing tests to check your lungs.  Tests to check how food moves through the digestive system (gastric emptying tests).  Blood, sweat, or urine tests.  Ultrasound of your bladder.  Spinal fluid tests. Focal neuropathy This condition may be diagnosed with:  A neurologic exam.  CT  scan.  MRI.  EMG.  Nerve conduction velocity tests. Proximal neuropathy There is no test to diagnose this type of neuropathy. You may have tests to rule out other possible causes of this type of neuropathy. Tests may include:  X-rays of your spine and lumbar region.  Lumbar puncture.  MRI. How is this treated? The goal of treatment is to keep nerve damage from getting worse. The most important part of treatment is keeping your blood glucose level and your A1C level within your target range by following your diabetes management plan. Over time, maintaining lower blood glucose levels helps lessen symptoms. In some cases, you may need prescription pain medicine. Follow these instructions at home:  Lifestyle   Do not use any products that contain nicotine or tobacco, such as cigarettes and e-cigarettes. If you need help quitting, ask your health care provider.  Be physically active every day. Include strength training and balance exercises.  Follow a healthy meal plan.  Work with your health care provider to manage your blood pressure. General instructions  Follow your diabetes management plan as directed. ? Check your blood glucose levels as directed by your health care provider. ? Keep your blood glucose in your target range as directed by your health care provider. ? Have your A1C level checked at least two times a year, or as often as told by your health care provider.  Take over the counter and prescription medicines only as told by your health care provider. This includes insulin and diabetes medicine.  Do not drive or use heavy machinery while taking prescription pain medicines.  Check your skin and feet every day for cuts, bruises, redness, blisters, or sores.  Keep all follow up visits as told by your health care provider. This is important. Contact a health care provider if:  You have burning, stabbing, or aching pain in your legs or feet.  You are unable to feel  pressure or pain in your feet.  You develop problems with digestion, such as: ? Nausea. ? Vomiting. ? Bloating. ? Constipation. ? Diarrhea. ? Abdominal pain.  You have difficulty with urination, such as inability: ? To control when you urinate (incontinence). ? To completely empty the bladder (retention).  You have palpitations.  You  feel dizzy, weak, or faint when you stand up. Get help right away if:  You cannot urinate.  You have sudden weakness or loss of coordination.  You have trouble speaking.  You have pain or pressure in your chest.  You have an irregular heart beat.  You have sudden inability to move a part of your body. Summary  Diabetic neuropathy refers to nerve damage that is caused by diabetes. It can affect nerves throughout the entire body, causing numbness and pain in the arms, legs, digestive tract, heart, and other body systems.  Keep your blood glucose level and your blood pressure in your target range, as directed by your health care provider. This can help prevent neuropathy from getting worse.  Check your skin and feet every day for cuts, bruises, redness, blisters, or sores.  Do not use any products that contain nicotine or tobacco, such as cigarettes and e-cigarettes. If you need help quitting, ask your health care provider. This information is not intended to replace advice given to you by your health care provider. Make sure you discuss any questions you have with your health care provider. Document Revised: 08/29/2017 Document Reviewed: 08/21/2016 Elsevier Patient Education  [place].  Peripheral Edema  Peripheral edema is swelling that is caused by a buildup of fluid. Peripheral edema most often affects the lower legs, ankles, and feet. It can also develop in the arms, hands, and face. The area of the body that has peripheral edema will look swollen. It may also feel heavy or warm. Your clothes may start to feel tight. Pressing on  the area may make a temporary dent in your skin. You may not be able to move your swollen arm or leg as much as usual. There are many causes of peripheral edema. It can happen because of a complication of other conditions such as congestive heart failure, kidney disease, or a problem with your blood circulation. It also can be a side effect of certain medicines or because of an infection. It often happens to women during pregnancy. Sometimes, the cause is not known. Follow these instructions at home: Managing pain, stiffness, and swelling   Raise (elevate) your legs while you are sitting or lying down.  Move around often to prevent stiffness and to lessen swelling.  Do not sit or stand for long periods of time.  Wear support stockings as told by your health care provider. Medicines  Take over-the-counter and prescription medicines only as told by your health care provider.  Your health care provider may prescribe medicine to help your body get rid of excess water (diuretic). General instructions  Pay attention to any changes in your symptoms.  Follow instructions from your health care provider about limiting salt (sodium) in your diet. Sometimes, eating less salt may reduce swelling.  Moisturize skin daily to help prevent skin from cracking and draining.  Keep all follow-up visits as told by your health care provider. This is important. Contact a health care provider if you have:  A fever.  Edema that starts suddenly or is getting worse, especially if you are pregnant or have a medical condition.  Swelling in only one leg.  Increased swelling, redness, or pain in one or both of your legs.  Drainage or sores at the area where you have edema. Get help right away if you:  Develop shortness of breath, especially when you are lying down.  Have pain in your chest or abdomen.  Feel weak.  Feel faint. Summary  Peripheral edema is swelling that is caused by a buildup of fluid.  Peripheral edema most often affects the lower legs, ankles, and feet.  Move around often to prevent stiffness and to lessen swelling. Do not sit or stand for long periods of time.  Pay attention to any changes in your symptoms.  Contact a health care provider if you have edema that starts suddenly or is getting worse, especially if you are pregnant or have a medical condition.  Get help right away if you develop shortness of breath, especially when lying down. This information is not intended to replace advice given to you by your health care provider. Make sure you discuss any questions you have with your health care provider. Document Revised: 04/10/2018 Document Reviewed: 04/10/2018 Elsevier Patient Education  [place].

## 2020-03-22 NOTE — Progress Notes (Signed)
Subjective:    Patient ID: Kelli Martinez, female    DOB: 02/11/1946, 74 y.o.   MRN: [id number]  No chief complaint on file.   HPI Pt is a 74 yo female with pmh sig for h/o PE, LE edema, OSA, asthma, GERD, hypothyroidism, DM 2 with peripheral neuropathy, h/o roux en y gastric bypass, h/o breast ca s/p R mastectomy, lymphedema, HLD, insomnia, blind in R eye, who was seen for f/u and ongoing concern.  Pt notes a firm nodule of L wrist that causes pain and becomes larger with increased use.    Pt has pruritic rash on RUE that starts off as hyperpigmented  Spots that look like moles, then become hypopigmented.    Pt endorses lymphedema in RUE s/p R mastectomy.  Pt needs a new compression sleeve and bras.  Unable to get on old compression sleeve.  Also requesting new compression hose for LE edema.  Pt notes increased sodium intake as eating more sandwiches.  Pt was taking HCTZ but did not notice an improvement in edema.  Did have increased urination, dehydration, and muscle cramps. Recently started having cramps in LEs and feet.  Pt states bs has been good.  Feels bad if fsbs is at 80.  Pt had a low bs of 70, ate yogurt to get it up.  Taking Acrabose 25 mg before breakfast, 50 mg in the afternoon, and 50 mg at dinner.    Pt followed by Dr. Nelva Bush, Dr. Shelda Pal, and Dr. Lynnell Grain at [facility].  Each provider focuses on a different area ie back/spine.  Pt seen by Dr. Annamaria Boots, Pulmonology for asthma.  Past Medical History:  Diagnosis Date  . Allergy   . Anemia   . Arthritis   . Asthma   . Back pain, chronic    GETS INJECTIONS IN BACK  . Blind right eye    hemorrhage  . Blood transfusion   . Cancer ([place])    breast 1994  . Clotting disorder ([place])   . Diabetes mellitus without complication ([place])    takes precose  . Elevated liver enzymes   . Gallstones   . GERD (gastroesophageal reflux disease)   . HX: breast cancer   . Hyperlipidemia   . Hypertension   . Hypothyroid   . Lymphedema of  arm    RT  . Morbid obesity ([place])   . OSA (obstructive sleep apnea)    has not used in 2 years-lost weight  . Osteoporosis   . Peripheral neuropathy    on gabapentin  . PONV (postoperative nausea and vomiting)   . Psoriasis   . Pulmonary embolism ([place]) 1998 / 1994 /1968  . Syncope and collapse 2011   due to anemia    Allergies  Allergen Reactions  . Adhesive [Tape] Rash    ROS General: Denies fever, chills, night sweats, changes in weight, changes in appetite HEENT: Denies headaches, ear pain, changes in vision, rhinorrhea, sore throat CV: Denies CP, palpitations, SOB, orthopnea Pulm: Denies SOB, cough, wheezing GI: Denies abdominal pain, nausea, vomiting, diarrhea, constipation GU: Denies dysuria, hematuria, frequency, vaginal discharge Msk: Denies muscle cramps, joint pains  +L wrist pain, lymphedema, LE edema, muscle spasms Neuro: Denies weakness, numbness, tingling Skin: Denies rashes, bruising  +pruritic rash Psych: Denies depression, anxiety, hallucinations     Objective:    Blood pressure 118/82, pulse 64, temperature 98.3 F (36.8 C), temperature source Oral, weight 222 lb (100.7 kg), SpO2 97 %.  Gen. Pleasant, well-nourished, in no distress,  normal affect   HEENT: [place]/AT, face symmetric, conjunctiva clear, no scleral icterus, PERRLA, EOMI, nares patent without drainage Lungs: no accessory muscle use, CTAB, no wheezes or rales Cardiovascular: RRR, no m/r/g, no peripheral edema Musculoskeletal: Firm round nodule on L lateral wrist.  No deformities, no cyanosis or clubbing, normal tone Neuro:  A&Ox3, CN II-XII intact, normal gait Skin:  Warm, dry, intact.  Hypopigmented macules on RUE with some hyperpigmented papules.  No excoriations, abrasions, erythema, or edema.   Wt Readings from Last 3 Encounters:  12/18/19 220 lb 3.2 oz (99.9 kg)  09/24/19 219 lb (99.3 kg)  08/19/19 216 lb 3.2 oz (98.1 kg)    Lab Results  Component Value Date   WBC 7.4 11/29/2019    HGB 12.2 11/29/2019   HCT 38.9 11/29/2019   PLT 156 11/29/2019   GLUCOSE 104 (H) 12/16/2019   CHOL 173 12/16/2019   TRIG 57.0 12/16/2019   HDL 83.40 12/16/2019   LDLCALC 78 12/16/2019   ALT 25 12/16/2019   AST 26 12/16/2019   NA 136 12/16/2019   K 4.3 12/16/2019   CL 102 12/16/2019   CREATININE 0.97 12/16/2019   BUN 17 12/16/2019   CO2 30 12/16/2019   TSH 2.76 12/16/2019   INR 2.1 03/04/2020   HGBA1C 6.5 12/16/2019   MICROALBUR <0.7 12/16/2019    Assessment/Plan:  Bilateral lower extremity edema -discussed increased edema likely 2/2 increased sodium intake. -supportive care strongly encouraged: elevating LEs, decreasing sodium intake -given paper rx for compression hose -for continued of worsened symptoms will obtain labs.   Muscle spasm  -discussed various causes including electrolyte abnormalities -likely 2/2 increased edema -discussed supportive care -for continued or worsening symptoms obtain BMP.  Dermatitis -clobetasol cream 0.05%  Other bursal cyst of left wrist  - Plan: Ambulatory referral to Orthopedic Surgery  Lymphedema -supportive care -given paper rx for compression hose, arm sleeve, and bras with inserts if needed.  DM type 2 with diabetic peripheral neuropathy (HCC) -hgb A1C 6.5% on 12/16/2019 -continue current meds: metformin acarbose 25 mg in am, 50 mg at lunch and prior to dinner.  Gabapentin 600 mg TID. -continue lifestyle modifications -ophthomology exam encouraged -discussed f/u with Dr. Dwyane Dee, Endocrinology about possible med adjustments to decrease hypoglycemic events.  F/u prn  Grier Mitts, MD

## 2020-03-23 ENCOUNTER — Encounter: Payer: Self-pay | Admitting: Internal Medicine

## 2020-03-23 ENCOUNTER — Ambulatory Visit (INDEPENDENT_AMBULATORY_CARE_PROVIDER_SITE_OTHER): Payer: Medicare Other | Admitting: Internal Medicine

## 2020-03-23 DIAGNOSIS — G4733 Obstructive sleep apnea (adult) (pediatric): Secondary | ICD-10-CM

## 2020-03-23 DIAGNOSIS — J4530 Mild persistent asthma, uncomplicated: Secondary | ICD-10-CM

## 2020-03-23 MED ORDER — FLUTICASONE-SALMETEROL 100-50 MCG/DOSE IN AEPB: 1.0000 | INHALATION_SPRAY | Freq: Two times a day (BID) | RESPIRATORY_TRACT | 2 refills | Status: DC

## 2020-03-23 MED ORDER — ALBUTEROL SULFATE HFA 108 (90 BASE) MCG/ACT IN AERS: 2.0000 | INHALATION_SPRAY | Freq: Four times a day (QID) | RESPIRATORY_TRACT | 2 refills | Status: DC | PRN

## 2020-03-23 NOTE — Patient Instructions (Signed)
Ok to consider what you want to do about your sleep apnea. We can discuss again next visit.  Please cal if we can help

## 2020-03-23 NOTE — Progress Notes (Signed)
Subjective:    Patient ID: Kelli Martinez, female    DOB: [birth year], 74 y.o.   MRN: [id number]  HPI F former smoker followed for OSA/ quit CPAP, allergic rhinitis,asthma,  hx pulm embolism/DVT/filter/coumadin(Dr Dwyane Dee), hx R breast Ca/ mastectomy/ xrt/chem/bonemarrow transplant at [facility], hx gastric bypass, DM 2 Office Spirometry 04/27/2015-WNL-FEV1/FVC 0.81, FEV1 2.50/110% HST- 12/31/19- AHI 9.1/ hr, desaturation to 88%, body weight 219 lbs ----------------------------------------------------------------------------------------   09/23/19- 73 yoF former smoker followed for Allergic Rhinitis, Asthma, OSA/ quit CPAP, insomnia,   hx pulm embolism/DVT/filter/coumadin(Dr Dwyane Dee), hx R breast Ca/ mastectomy/ xrt/chem/bonemarrow transplant at [facility], hx gastric bypass, DM 2, Chronic peripheral Edema Dr Dwyane Dee manages peripheral edema. Back pain disturbs sleep. ----f/u Allergic asthma mild persistent/OSA   Advair 100, Albuterol rescue  Has awakened herself snoring. Agrees to reschedule HST. Perennial postnasal drip. Avoids AH due to dry eyes, but hasn't tried AH nasal spray. Discussed temazepam, which works very well for occasional use. Had flu vax and first covid vax. CXR 03/20/2019- No active cardiopulmonary disease.  03/23/20- 74 yoF former smoker followed for Allergic Rhinitis, Asthma, OSA/ quit CPAP, insomnia,   hx pulm embolism/DVT/filter/coumadin(Dr Dwyane Dee), hx R breast Ca/ mastectomy/ xrt/chem/bone marrow transplant at [facility], hx gastric bypass, DM 2, Chronic peripheral Edema Dr Dwyane Dee manages peripheral edema. Back pain disturbs sleep. -----osa, asthma,wants to discuss nasal spray,Astilin vs Flonase, review HST, wakes at times snoring, tired during day HST- 12/31/19- AHI 9.1/ hr, desaturation to 88%, body weight 219 lbs Body weight today 219 lbs Lives alone- sister has told her she is not snoring. Proair hfa, flonase, Advair 100, temazepam 15,   Had 2 Phizer Covaxx Diuretic wakes her frequently  w nocturia.  Wants to wait on CPAP  Review of Systems- see HPI + = positive Constitutional:   No-   weight loss, night sweats, fevers, chills, fatigue, lassitude. HEENT:   No-  headaches, difficulty swallowing, tooth/dental problems, sore throat,   +dry mouth      No-  sneezing, itching, ear ache,  +nasal congestion, +post nasal drip, +blind R eye CV:  No-   chest pain, orthopnea, PND, swelling in lower extremities, anasarca, dizziness, palpitations Resp: No-   shortness of breath with exertion or at rest.              No-   productive cough,   non-productive cough,  No-  coughing up of blood.              No-   change in color of mucus.   Skin: No-   rash or lesions. GI:  No-   heartburn, indigestion, abdominal pain, nausea, vomiting,  GU:  MS:  No-   joint pain,    swelling.   Neuro- : +HPI Psych:  No- change in mood or affect. No depression or anxiety.  No memory loss.   Objective:   Physical Exam General- Alert, Oriented, Affect-appropriate, Distress- none acute  + obese( most of weight is in her legs and hips) Skin- rash-none, lesions- none, excoriation- none. XRT skin changes right lateral neck Lymphadenopathy- none Head- atraumatic            Eyes- +blind R eye,  clear conjunctivae            Ears- Hearing, canals, TMs normal            Nose- Clear, no- Septal dev, + sticky mucus, polyps, erosion, perforation             Throat- Mallampati III ,  mucosa clear , drainage- none, tonsils- atrophic, +missing teeth Neck- flexible , trachea midline, no stridor , thyroid nl, carotid no bruit Chest - symmetrical excursion , unlabored           Heart/CV- RRR , no murmur , no gallop  , no rub, nl s1 s2                           - JVD+1, edema+3, stasis changes- , varices- none           Lung- clear to P&A, wheeze-none, cough- none , dullness-none, rub- none           Chest wall- + right mastectomy Abd-  Br/ Gen/ Rectal- Not done, not indicated Extrem- cyanosis- none, clubbing- none,  atrophy- none, strength- nl   Heavy legs. + cane Neuro- grossly intact to observation  Assessment & Plan:

## 2020-03-24 ENCOUNTER — Telehealth: Payer: Self-pay | Admitting: *Deleted

## 2020-03-24 MED ORDER — PRAVASTATIN SODIUM 40 MG PO TABS: 40.0000 mg | ORAL_TABLET | Freq: Every day | ORAL | 3 refills | Status: DC

## 2020-03-24 MED ORDER — OMEPRAZOLE 20 MG PO CPDR: 20.0000 mg | DELAYED_RELEASE_CAPSULE | Freq: Every day | ORAL | 3 refills | Status: DC

## 2020-03-24 NOTE — Telephone Encounter (Signed)
Received fax from [facility] for refill of Spironolactone 100mg . Do you wish to refill?

## 2020-03-24 NOTE — Telephone Encounter (Signed)
Rx refilled.

## 2020-03-24 NOTE — Telephone Encounter (Signed)
Okay to refill? 

## 2020-03-25 ENCOUNTER — Other Ambulatory Visit: Payer: Self-pay

## 2020-03-25 MED ORDER — SPIRONOLACTONE 50 MG PO TABS: 50.0000 mg | ORAL_TABLET | Freq: Two times a day (BID) | ORAL | 2 refills | Status: DC

## 2020-03-25 NOTE — Telephone Encounter (Signed)
Rx has been sent to [facility].

## 2020-03-26 ENCOUNTER — Telehealth: Payer: Self-pay | Admitting: Endocrinology

## 2020-03-26 NOTE — Telephone Encounter (Signed)
Patient called and requested that we clarify RX for Spironolactone. Per patient Dr Dwyane Dee had at her last appointment advised her to take 1 100 MG Spironolactone in the morning and 1 50 MG Spironolactone in the evening.  [facility] received different sets of instructions.  Please clarify and resend RX to [facility]  Call back number [phone]

## 2020-03-28 ENCOUNTER — Encounter: Payer: Self-pay | Admitting: Family Medicine

## 2020-03-29 ENCOUNTER — Other Ambulatory Visit: Payer: Self-pay | Admitting: *Deleted

## 2020-03-29 ENCOUNTER — Telehealth: Payer: Self-pay | Admitting: Internal Medicine

## 2020-03-29 NOTE — Telephone Encounter (Signed)
[facility] calling to follow up on the call the patient made on 03/26/20 re: clarifying instructions for the 50 mg.   Ph# [phone] Ref# [id number]

## 2020-03-30 MED ORDER — FLUTICASONE PROPIONATE 50 MCG/ACT NA SUSP: 2.0000 | Freq: Every day | NASAL | 3 refills | Status: DC

## 2020-03-30 MED ORDER — AZELASTINE HCL 0.1 % NA SOLN: NASAL | 3 refills | Status: DC

## 2020-03-30 NOTE — Telephone Encounter (Signed)
Spoke with Donnamae Jude, Certified Pharmacy Technician. Prescription clarified.

## 2020-03-30 NOTE — Telephone Encounter (Signed)
She will take 100 mg in the morning and 50 mg in the evening, okay to use either 50 mg or 100 mg tablets as a single prescription

## 2020-03-30 NOTE — Telephone Encounter (Signed)
Refill for both azelastine and fluticasone nasal sprays have been sent to [facility] for pt. Attempted to call pt to let her know this had been done but unable to reach. Left pt a detailed message on machine letting her know that both meds were refilled. Nothing further needed.

## 2020-03-30 NOTE — Telephone Encounter (Signed)
Please advise 

## 2020-04-01 ENCOUNTER — Other Ambulatory Visit: Payer: Self-pay

## 2020-04-01 ENCOUNTER — Ambulatory Visit (INDEPENDENT_AMBULATORY_CARE_PROVIDER_SITE_OTHER): Payer: Medicare Other | Admitting: General Practice

## 2020-04-01 DIAGNOSIS — Z7901 Long term (current) use of anticoagulants: Secondary | ICD-10-CM

## 2020-04-01 LAB — POCT INR: INR: 2 (ref 2.0–3.0)

## 2020-04-01 NOTE — Patient Instructions (Addendum)
Pre visit review using our clinic review tool, if applicable. No additional management support is needed unless otherwise documented below in the visit note.  Change dosage and take 2 tablets daily. Re-check in 4 weeks.

## 2020-04-07 NOTE — Assessment & Plan Note (Signed)
No recent exacerbation Plan- continue current meds

## 2020-04-07 NOTE — Assessment & Plan Note (Signed)
Not a candidate for OAP due to dentition. She wants to wait on CPA for now, conssider trying again in future

## 2020-04-19 ENCOUNTER — Other Ambulatory Visit: Payer: Medicare Other

## 2020-04-20 ENCOUNTER — Other Ambulatory Visit: Payer: Self-pay

## 2020-04-20 ENCOUNTER — Other Ambulatory Visit (INDEPENDENT_AMBULATORY_CARE_PROVIDER_SITE_OTHER): Payer: Medicare Other

## 2020-04-20 DIAGNOSIS — E1142 Type 2 diabetes mellitus with diabetic polyneuropathy: Secondary | ICD-10-CM | POA: Diagnosis not present

## 2020-04-20 DIAGNOSIS — E063 Autoimmune thyroiditis: Secondary | ICD-10-CM

## 2020-04-20 LAB — T4, FREE: Free T4: 0.96 ng/dL (ref 0.60–1.60)

## 2020-04-20 LAB — COMPREHENSIVE METABOLIC PANEL
ALT: 30 U/L (ref 0–35)
AST: 28 U/L (ref 0–37)
Albumin: 4.2 g/dL (ref 3.5–5.2)
Alkaline Phosphatase: 73 U/L (ref 39–117)
BUN: 19 mg/dL (ref 6–23)
CO2: 32 mEq/L (ref 19–32)
Calcium: 9.8 mg/dL (ref 8.4–10.5)
Chloride: 97 mEq/L (ref 96–112)
Creatinine, Ser: 0.96 mg/dL (ref 0.40–1.20)
GFR: 69.06 mL/min (ref >60.00)
Glucose, Bld: 95 mg/dL (ref 70–99)
Potassium: 4.3 mEq/L (ref 3.5–5.1)
Sodium: 134 mEq/L — ABNORMAL LOW (ref 135–145)
Total Bilirubin: 0.3 mg/dL (ref 0.2–1.2)
Total Protein: 7.4 g/dL (ref 6.0–8.3)

## 2020-04-20 LAB — TSH: TSH: 2.52 u[IU]/mL (ref 0.35–4.50)

## 2020-04-20 LAB — HEMOGLOBIN A1C: Hgb A1c MFr Bld: 6.9 % — ABNORMAL HIGH (ref 4.6–6.5)

## 2020-04-22 ENCOUNTER — Encounter: Payer: Self-pay | Admitting: Endocrinology

## 2020-04-22 ENCOUNTER — Ambulatory Visit (INDEPENDENT_AMBULATORY_CARE_PROVIDER_SITE_OTHER): Payer: Medicare Other | Admitting: Endocrinology

## 2020-04-22 ENCOUNTER — Other Ambulatory Visit: Payer: Self-pay

## 2020-04-22 VITALS — BP 112/62 | HR 49 | Ht 67.0 in | Wt 218.0 lb

## 2020-04-22 DIAGNOSIS — I872 Venous insufficiency (chronic) (peripheral): Secondary | ICD-10-CM

## 2020-04-22 DIAGNOSIS — E1142 Type 2 diabetes mellitus with diabetic polyneuropathy: Secondary | ICD-10-CM | POA: Diagnosis not present

## 2020-04-22 DIAGNOSIS — K912 Postsurgical malabsorption, not elsewhere classified: Secondary | ICD-10-CM

## 2020-04-22 DIAGNOSIS — E039 Hypothyroidism, unspecified: Secondary | ICD-10-CM

## 2020-04-22 DIAGNOSIS — E7800 Pure hypercholesterolemia, unspecified: Secondary | ICD-10-CM

## 2020-04-22 MED ORDER — LEVOTHYROXINE SODIUM 88 MCG PO TABS: 88.0000 ug | ORAL_TABLET | Freq: Every day | ORAL | 3 refills | Status: DC

## 2020-04-22 NOTE — Progress Notes (Signed)
Patient ID: Kelli Martinez, female   DOB: May 01, 1946, 74 y.o.   MRN: [id number]    Chief complaint: Followup of various chronic problems    History of Present Illness:  The patient is seen for the following problems:  1.  Postprandial hypoglycemia related to dumping syndrome   She has had postprandial hypoglycemia starting a couple of years after her gastric bypass surgery Her symptoms of blood sugars are mostly a feeling of significant weakness, some shakiness.  She will use glucose tablets or juice in order to relieve the symptoms, recently she thinks that 1 or 2 glucose tablets are usually adequate She has been to the dietitian and has been instructed on balanced low-fat meals with enough protein consistently and restricting carbohydrates including fruits   Recent history:  She has been treated with acarbose 25 mg before meals  She did try verapamil previously but this did not help.   She is taking her Precose before meals, now taking 1 before breakfast, 2 tablet at lunch and 1 at dinnertime   Again has a tendency to hypoglycemia in the afternoons at various times, recently had a glucose of 57 before dinner However she is having 2 slices of bread at lunchtime and less carbohydrates at other meals Sometimes eating only 2 meals a day and may just have yogurt in the morning  She is now complaining about episodes of weakness and imbalance during the night when she is getting up to go to the bathroom She states that this is relieved by food such as grapes However she does not check her sugar at that time This is despite her having a bedtime snack of a half sandwich   2.  SWELLING of the legs:  She has had significant problems with her legs and feet chronically  Swelling increases at the end of the day but is variable from day-to-day She continues to be taking Aldactone 150 mg total per day and she is using 100 mg dose in the morning and 50 mg dose in the  evening  Taking 20 mg Lasix daily however gets cramping with higher doses  As before she does not like to wear compression stockings because of discomfort and difficulty putting them on  Has discussed the problem with her PCP also  Renal function and potassium are consistently normal  Lab Results  Component Value Date   K 4.3 04/20/2020   Lab Results  Component Value Date   CREATININE 0.96 04/20/2020   CREATININE 0.97 12/16/2019   CREATININE 1.04 (H) 11/29/2019     PROBLEM 3:  DIABETES:  This has been mild and well controlled since her gastric bypass surgery  The A1c usually higher than expected, now 6.9  Current management, blood sugar patterns:  She has a fairly good diet as above but has difficulty losing weight  She has not checked her fasting readings in quite some time, most recent morning reading was 87 and lab glucose was nonfasting  No side effects with Metformin taken at bedtime  She is not able to do exercise  She is taking METFORMIN 1000 mg at bedtime for fasting hyperglycemia Also taking Precose at each meal also to prevent reactive hypoglycemia as discussed above  Glucose readings from monitor download:  RECENT range 57-111 checked at different times but mostly midday and afternoon and only once after 7 PM AVERAGE 90,    Wt Readings from Last 3 Encounters:  04/22/20 218 lb (98.9 kg)  03/23/20 219 lb (  99.3 kg)  03/22/20 222 lb (100.7 kg)    Lab Results  Component Value Date   HGBA1C 6.9 (H) 04/20/2020   HGBA1C 6.5 12/16/2019   HGBA1C 6.6 (H) 06/16/2019   Lab Results  Component Value Date   MICROALBUR <0.7 12/16/2019   LDLCALC 78 12/16/2019   CREATININE 0.96 04/20/2020      Other active problems: see review of systems      Allergies as of 04/22/2020      Reactions   Adhesive [tape] Rash      Medication List       Accurate as of April 22, 2020  4:47 PM. If you have any questions, ask your nurse or doctor.         acarbose 25 MG tablet Commonly known as: PRECOSE Take 25 mg by mouth 3 (three) times daily with meals. Take 1 tablet by mouth at breakfast and lunch, and 2 tablets at dinner.   albuterol 108 (90 Base) MCG/ACT inhaler Commonly known as: ProAir HFA Inhale 2 puffs into the lungs every 6 (six) hours as needed for wheezing or shortness of breath.   azelastine 0.1 % nasal spray Commonly known as: ASTELIN 1-2 puffs each nostril twice daily as needed   baclofen 20 MG tablet Commonly known as: LIORESAL TAKE 1 TABLET DAILY What changed: when to take this   baclofen 10 MG tablet Commonly known as: LIORESAL TAKE 1 TABLET TWICE A DAY What changed: Another medication with the same name was changed. Make sure you understand how and when to take each.   Caltrate 600+D 600-400 MG-UNIT tablet Generic drug: Calcium Carbonate-Vitamin D Take 1 tablet by mouth daily.   clobetasol cream 0.05 % Commonly known as: TEMOVATE APPLY 1 APPLICATION TOPICALLY TWICE A DAY What changed: See the new instructions.   diazepam 10 MG Gel Commonly known as: DIASTAT ACUDIAL Place 5 mg rectally once as needed (cramping).   ferrous sulfate 325 (65 FE) MG tablet Take 325 mg by mouth daily.   fluticasone 50 MCG/ACT nasal spray Commonly known as: FLONASE Place 2 sprays into both nostrils daily.   Fluticasone-Salmeterol 100-50 MCG/DOSE Aepb Commonly known as: Advair Diskus Inhale 1 puff into the lungs every 12 (twelve) hours. USE 1 INHALATION EVERY 12 HOURS, RINSE MOUTH   FreeStyle Freedom Lite w/Device Kit Use as directed to check blood sugar once daily.   freestyle lancets USE AS INSTRUCTED TO CHECK BLOOD SUGAR ONCE DAILY   FREESTYLE LITE test strip Generic drug: glucose blood USE AS INSTRUCTED TO CHECK BLOOD SUGAR ONCE DAILY   furosemide 20 MG tablet Commonly known as: LASIX Take 2 tablets (40 mg total) by mouth daily for 5 days.   gabapentin 300 MG capsule Commonly known as: NEURONTIN TAKE 2  CAPSULES THREE TIMES A DAY   HYDROcodone-acetaminophen 5-325 MG tablet Commonly known as: Norco Take 1 tablet by mouth 2 (two) times daily as needed for moderate pain. What changed:   how much to take  when to take this  reasons to take this   Levoxyl 125 MCG tablet Generic drug: levothyroxine Take 125 mcg by mouth See admin instructions. Take 1 tablet (125 mcg) by mouth daily on Monday-Friday   metFORMIN 500 MG tablet Commonly known as: GLUCOPHAGE TAKE 2 TABLETS ONCE DAILY AT BEDTIME What changed:   how much to take  how to take this  when to take this  additional instructions   methylcellulose 1 % ophthalmic solution Commonly known as: ARTIFICIAL TEARS Place 1 drop  into both eyes as needed. Dry eyes   omeprazole 20 MG capsule Commonly known as: PRILOSEC Take 1 capsule (20 mg total) by mouth daily.   pravastatin 40 MG tablet Commonly known as: PRAVACHOL Take 1 tablet (40 mg total) by mouth daily.   SLOW-MAG PO Take 500 mg by mouth at bedtime.   spironolactone 50 MG tablet Commonly known as: ALDACTONE Take 1 tablet (50 mg total) by mouth 2 (two) times daily. Take 2 tablets by mouth daily in the morning and 1 tablet in the evening.   temazepam 15 MG capsule Commonly known as: RESTORIL 1-2 for sleep as needed What changed:   how much to take  how to take this  when to take this  reasons to take this   vitamin B-12 500 MCG tablet Commonly known as: CYANOCOBALAMIN Take 500 mcg by mouth daily.   Vitamin D (Ergocalciferol) 1.25 MG (50000 UNIT) Caps capsule Commonly known as: DRISDOL TAKE 1 CAPSULE EVERY FRIDAY What changed: See the new instructions.   warfarin 5 MG tablet Commonly known as: COUMADIN Take as directed by the anticoagulation clinic. If you are unsure how to take this medication, talk to your nurse or doctor. Original instructions: Take 2 tablets (10 mg total) by mouth daily. TAKE 2 TABLETS BY MOUTH ONCE DAILY, EXCEPT WEDNESDAYS. ON  Wednesday, TAKE 1 TABLET DAILY. What changed:   how much to take  when to take this  additional instructions       Allergies:  Allergies  Allergen Reactions  . Adhesive [Tape] Rash    Past Medical History:  Diagnosis Date  . Allergy   . Anemia   . Arthritis   . Asthma   . Back pain, chronic    GETS INJECTIONS IN BACK  . Blind right eye    hemorrhage  . Blood transfusion   . Cancer ([place])    breast 1994  . Clotting disorder ([place])   . Diabetes mellitus without complication ([place])    takes precose  . Elevated liver enzymes   . Gallstones   . GERD (gastroesophageal reflux disease)   . HX: breast cancer   . Hyperlipidemia   . Hypertension   . Hypothyroid   . Lymphedema of arm    RT  . Morbid obesity ([place])   . OSA (obstructive sleep apnea)    has not used in 2 years-lost weight  . Osteoporosis   . Peripheral neuropathy    on gabapentin  . PONV (postoperative nausea and vomiting)   . Psoriasis   . Pulmonary embolism ([place]) 1998 / 1994 /1968  . Syncope and collapse 2011   due to anemia    Past Surgical History:  Procedure Laterality Date  . ABDOMINAL HYSTERECTOMY    . BONE MARROW TRANSPLANT  1994  . CARDIOVASCULAR STRESS TEST  05/23/2005   EF 53%  . carpal tunnell     bil  . cataracts    . CHOLECYSTECTOMY  05/10/2012   Procedure: LAPAROSCOPIC CHOLECYSTECTOMY WITH INTRAOPERATIVE CHOLANGIOGRAM;  Surgeon: Pedro Earls, MD;  Location: [facility];  Service: General;  Laterality: N/A;  Laparoscopic Cholecystectomy with Intraoperative Cholangiogram  . GASTRIC BYPASS  2011   bariatric surgery  . HIATAL HERNIA REPAIR    . IVC filter     recurrent DVT  . KNEE ARTHROTOMY  1998  . MASS EXCISION Right 11/24/2016   Procedure: EXCISION RIGHT AXILLARY LESION AND CHEST WALL LESION;  Surgeon: Johnathan Hausen, MD;  Location: [facility];  Service: General;  Laterality: Right;  . MASTECTOMY     Right  . US ECHOCARDIOGRAPHY  10/27/08   EF 55-60%    Family  History  Problem Relation Age of Onset  . Sudden death Mother        car accident  . Cancer Father 31       lung  . Cancer Sister 55       lung cancer  . Asthma Daughter     Social History:  reports that she quit smoking about 41 years ago. Her smoking use included cigarettes. She has a 5.00 pack-year smoking history. She has never used smokeless tobacco. She reports that she does not drink alcohol and does not use drugs.  Review of Systems   ANTICOAGULATION: Her Coumadin has been followed regularly in the clinic INR results as follows  Lab Results  Component Value Date   INR 2.0 04/01/2020   INR 2.1 03/04/2020   INR 1.8 (A) 02/12/2020    ABNORMAL liver functions: She has had these before transiently and no etiology found Not related to statins  Lab Results  Component Value Date   ALT 30 04/20/2020   ALT 25 01/19/2016     Neuropathy with persistent burning, mild numbness and lower leg pain:  She gets relief with nortriptyline and gabapentin  MUSCLE cramps/spasms: She is being treated with 10 mg baclofen during the day twice daily and 20 mg at bedtime If she has a cramp during the night she will take an extra dose of baclofen 10 mg  She also uses tonic water, mustard and magnesium supplements    Lymphedema right arm, secondary to  breast surgery, using  elastic compression stocking with relief   History of mild hypothyroidism: Has been present for several years and minimally symptomatic usually  She feels fairly good overall Levothyroxine dose: 125 mcg daily on weekdays only and skipping the weekends Still not ready to switch her prescription since she has a large supply of the 125 levothyroxine dose Thyroid levels are consistently okay   Lab Results  Component Value Date   TSH 2.52 04/20/2020   TSH 2.76 12/16/2019   TSH 2.91 08/14/2019   FREET4 0.96 04/20/2020   FREET4 1.17 12/16/2019   FREET4 1.12 08/14/2019     Hypercholesterolemia: LDL has been at  target with pravastatin Labs as below  Lipid levels:  Lab Results  Component Value Date   CHOL 173 12/16/2019   HDL 83.40 12/16/2019   LDLCALC 78 12/16/2019   TRIG 57.0 12/16/2019   CHOLHDL 2 12/16/2019   Lab Results  Component Value Date   ALT 30 04/20/2020   ALT 25 01/19/2016    Blood pressure controlled with diuretics including Aldactone However standing blood pressure is relatively low today  BP Readings from Last 3 Encounters:  04/22/20 112/62  03/23/20 116/64  03/22/20 118/82     LABS:  Lab on 04/20/2020  Component Date Value Ref Range Status  . Free T4 04/20/2020 0.96  0.60 - 1.60 ng/dL Final   Comment: Specimens from patients who are undergoing biotin therapy and /or ingesting biotin supplements may contain high levels of biotin.  The higher biotin concentration in these specimens interferes with this Free T4 assay.  Specimens that contain high levels  of biotin may cause false high results for this Free T4 assay.  Please interpret results in light of the total clinical presentation of the patient.    Marland Kitchen TSH 04/20/2020 2.52  0.35 - 4.50  uIU/mL Final  . Sodium 04/20/2020 134* 135 - 145 mEq/L Final  . Potassium 04/20/2020 4.3  3.5 - 5.1 mEq/L Final  . Chloride 04/20/2020 97  96 - 112 mEq/L Final  . CO2 04/20/2020 32  19 - 32 mEq/L Final  . Glucose, Bld 04/20/2020 95  70 - 99 mg/dL Final  . BUN 04/20/2020 19  6 - 23 mg/dL Final  . Creatinine, Ser 04/20/2020 0.96  0.40 - 1.20 mg/dL Final  . Total Bilirubin 04/20/2020 0.3  0.2 - 1.2 mg/dL Final  . Alkaline Phosphatase 04/20/2020 73  39 - 117 U/L Final  . AST 04/20/2020 28  0 - 37 U/L Final  . ALT 04/20/2020 30  0 - 35 U/L Final  . Total Protein 04/20/2020 7.4  6.0 - 8.3 g/dL Final  . Albumin 04/20/2020 4.2  3.5 - 5.2 g/dL Final  . GFR 04/20/2020 69.06  >60.00 mL/min Final  . Calcium 04/20/2020 9.8  8.4 - 10.5 mg/dL Final  . Hgb A1c MFr Bld 04/20/2020 6.9* 4.6 - 6.5 % Final   Glycemic Control Guidelines for  People with Diabetes:Non Diabetic:  <6%Goal of Therapy: <7%Additional Action Suggested:  >8%   Anti-coag visit on 04/01/2020  Component Date Value Ref Range Status  . INR 04/01/2020 2.0  2.0 - 3.0 Final     EXAM:  BP 112/62 (Patient Position: Standing)   Pulse (!) 49   Ht 5' 7" (1.702 m)   Wt 218 lb (98.9 kg)   SpO2 99%   BMI 34.14 kg/m      Assessment/Plan:   1.  EDEMA: This is related to venous insufficiency and idiopathic fluid retention She is on  Aldactone and also on Lasix 20 mg with generally good control  However does have more swelling on some days She is complaining about recurrent urination at night however Also standing blood pressure is relatively low  Since she is having some orthostatic weakness during the night will leave off her evening spironolactone and take it as needed only   2. Reactive hypoglycemia secondary to gastrointestinal surgery: Has one low sugar documented at dinnertime  Likely is getting more symptoms in the afternoon periodically because of eating 2 slices of bread and reactive hyperinsulinemia She is getting frequent nocturnal symptoms of weakness and imbalance and not clear if this is hypoglycemia but she feels relieved with food  Although her A1c is high she will try to leave off Metformin for at least a week to see if nocturnal symptoms resolve Also she will cut back on her bread to 1 slice at lunchtime and had another serving of protein or vegetables  3. Mild diabetes: A1c 6.9 and not concordant with her glucose levels however  She needs to start checking readings on waking up and also some after dinner and call if consistently high We will stop Metformin as above  4.   Primary hypothyroidism: She is taking 125 mcg, 5 days a week and TSH is consistently normal We will switch her to 88 mcg     Patient Instructions  Stop Metformin  1/2 sandwich at lunch  Stop pm dose of Spironolactone and take as needed   Elayne Snare 04/22/2020, 4:47 PM

## 2020-04-22 NOTE — Patient Instructions (Addendum)
Stop Metformin  1/2 sandwich at lunch  Stop pm dose of Spironolactone and take as needed

## 2020-04-29 ENCOUNTER — Ambulatory Visit (INDEPENDENT_AMBULATORY_CARE_PROVIDER_SITE_OTHER): Payer: Medicare Other | Admitting: General Practice

## 2020-04-29 ENCOUNTER — Other Ambulatory Visit: Payer: Self-pay

## 2020-04-29 DIAGNOSIS — Z7901 Long term (current) use of anticoagulants: Secondary | ICD-10-CM | POA: Diagnosis not present

## 2020-04-29 LAB — POCT INR: INR: 2.1 (ref 2.0–3.0)

## 2020-04-29 NOTE — Patient Instructions (Addendum)
Pre visit review using our clinic review tool, if applicable. No additional management support is needed unless otherwise documented below in the visit note.  Continue to take take 2 tablets daily. Re-check in 6 weeks.

## 2020-04-29 NOTE — Progress Notes (Signed)
Medical screening examination/treatment/procedure(s) were performed by non-physician practitioner and as supervising physician I was immediately available for consultation/collaboration. I agree with above. Harm Jou, MD   

## 2020-05-03 ENCOUNTER — Telehealth: Payer: Self-pay | Admitting: General Practice

## 2020-05-03 NOTE — Telephone Encounter (Signed)
Instructions for holding coumadin for procedure on 10/12.  10-7 - Last dose of coumadin until after procedure  10-13 - Take 3 tablet of coumadin (15 mg) 10-14 - Take 3 tablets of coumadin (15 mg) 10-15 - Take 3 tablets of coumadin (15 mg) 10-16 - Resume coumadin 2 tablets (10 mg) daily.  10-21 - Check INR with Villa Herb, RN @ [facility] office.

## 2020-05-10 ENCOUNTER — Telehealth: Payer: Self-pay

## 2020-05-10 ENCOUNTER — Ambulatory Visit: Payer: Medicare Other

## 2020-05-10 NOTE — Telephone Encounter (Signed)
New message   Meformin was taken off x 1 week  Blood sugar Monday 10.11.21-- 77 Blood sugar on Sunday 10.10.21--81 Blood sugar on Saturday 10.9.21--87 Blood sugar on Friday 10.8.21 ---88

## 2020-05-10 NOTE — Telephone Encounter (Signed)
Okay, noted

## 2020-05-11 ENCOUNTER — Other Ambulatory Visit: Payer: Self-pay

## 2020-05-11 ENCOUNTER — Ambulatory Visit (INDEPENDENT_AMBULATORY_CARE_PROVIDER_SITE_OTHER): Payer: Medicare Other | Admitting: Pharmacist Clinician (PhC)/ Clinical Pharmacy Specialist

## 2020-05-11 DIAGNOSIS — Z5181 Encounter for therapeutic drug level monitoring: Secondary | ICD-10-CM

## 2020-05-11 DIAGNOSIS — I2699 Other pulmonary embolism without acute cor pulmonale: Secondary | ICD-10-CM | POA: Diagnosis not present

## 2020-05-11 DIAGNOSIS — Z7901 Long term (current) use of anticoagulants: Secondary | ICD-10-CM | POA: Diagnosis not present

## 2020-05-11 LAB — POCT INR: INR: 1 — AB (ref 2.0–3.0)

## 2020-05-12 ENCOUNTER — Other Ambulatory Visit: Payer: Self-pay

## 2020-05-12 ENCOUNTER — Other Ambulatory Visit: Payer: Self-pay | Admitting: *Deleted

## 2020-05-12 MED ORDER — GABAPENTIN 300 MG PO CAPS: ORAL_CAPSULE | ORAL | 1 refills | Status: DC

## 2020-05-12 MED ORDER — GABAPENTIN 300 MG PO CAPS: ORAL_CAPSULE | ORAL | 0 refills | Status: DC

## 2020-05-12 NOTE — Telephone Encounter (Signed)
Called patient, advised of message from [facility]. Patient verbalized understanding.

## 2020-05-12 NOTE — Telephone Encounter (Signed)
Patient called to ask if she needs to continue the Metformin based on her blood sugar reading provided on 05/10/20  Patient also needs a gap RX for Gabapentin sent [facility] on [address] - this would be just until her mailing service deliveries her medication

## 2020-05-12 NOTE — Telephone Encounter (Signed)
Please advise if patient needs to continue on Metformin

## 2020-05-12 NOTE — Telephone Encounter (Signed)
Do not restart metformin

## 2020-05-12 NOTE — Telephone Encounter (Signed)
Also needs new RX sent to [facility] for Gabapentin

## 2020-05-20 ENCOUNTER — Other Ambulatory Visit: Payer: Self-pay

## 2020-05-20 ENCOUNTER — Ambulatory Visit (INDEPENDENT_AMBULATORY_CARE_PROVIDER_SITE_OTHER): Payer: Medicare Other | Admitting: General Practice

## 2020-05-20 DIAGNOSIS — Z7901 Long term (current) use of anticoagulants: Secondary | ICD-10-CM

## 2020-05-20 DIAGNOSIS — Z23 Encounter for immunization: Secondary | ICD-10-CM | POA: Diagnosis not present

## 2020-05-20 LAB — POCT INR: INR: 1.3 — AB (ref 2.0–3.0)

## 2020-05-20 NOTE — Progress Notes (Signed)
Medical screening examination/treatment/procedure(s) were performed by non-physician practitioner and as supervising physician I was immediately available for consultation/collaboration. I agree with above. Calena Salem, MD   

## 2020-05-20 NOTE — Patient Instructions (Addendum)
Pre visit review using our clinic review tool, if applicable. No additional management support is needed unless otherwise documented below in the visit note.  Take 3 tablets (15 mg) today, tomorrow, Saturday and Sunday.  On Monday start back with taking 2 tablets day.  Re-check in 1 to 2 weeks.

## 2020-06-03 ENCOUNTER — Other Ambulatory Visit: Payer: Self-pay

## 2020-06-03 ENCOUNTER — Ambulatory Visit (INDEPENDENT_AMBULATORY_CARE_PROVIDER_SITE_OTHER): Payer: Medicare Other | Admitting: General Practice

## 2020-06-03 DIAGNOSIS — Z7901 Long term (current) use of anticoagulants: Secondary | ICD-10-CM | POA: Diagnosis not present

## 2020-06-03 LAB — POCT INR: INR: 2.3 (ref 2.0–3.0)

## 2020-06-03 NOTE — Patient Instructions (Addendum)
Pre visit review using our clinic review tool, if applicable. No additional management support is needed unless otherwise documented below in the visit note.  Continue to take 2 tablets daily.  Re-check in 4 weeks. 

## 2020-06-03 NOTE — Progress Notes (Signed)
Medical screening examination/treatment/procedure(s) were performed by non-physician practitioner and as supervising physician I was immediately available for consultation/collaboration. I agree with above. Brayson Livesey, MD   

## 2020-06-08 ENCOUNTER — Other Ambulatory Visit: Payer: Self-pay | Admitting: Family Medicine

## 2020-06-10 ENCOUNTER — Ambulatory Visit: Payer: Medicare Other

## 2020-06-10 NOTE — Telephone Encounter (Signed)
Last OV 03/22/20 Last refill 12/19/18 #120 g / 5 Next OV not scheduled

## 2020-06-15 ENCOUNTER — Telehealth: Payer: Self-pay

## 2020-06-15 ENCOUNTER — Other Ambulatory Visit: Payer: Self-pay | Admitting: *Deleted

## 2020-06-15 NOTE — Telephone Encounter (Signed)
New message     1. Which medications need to be refilled? (please list name of each medication and dose if known) baclofen (LIORESAL) 10 MG tablet -  [facility]'t [facility] because of the wording 10 mg and 20 mg   2. Which pharmacy/location (including street and city if local pharmacy) is medication to be sent to?[facility]

## 2020-06-15 NOTE — Telephone Encounter (Signed)
Is she suppose to be taking both 10 and 20 or just 20mg ?

## 2020-06-15 NOTE — Telephone Encounter (Signed)
She can take 10 mg and can send prescription for 10 mg 3 times daily

## 2020-06-16 ENCOUNTER — Other Ambulatory Visit: Payer: Self-pay | Admitting: *Deleted

## 2020-06-16 MED ORDER — BACLOFEN 10 MG PO TABS
10.0000 mg | ORAL_TABLET | Freq: Three times a day (TID) | ORAL | 1 refills | Status: DC
Start: 2020-06-16 — End: 2020-11-25

## 2020-06-16 NOTE — Telephone Encounter (Signed)
Noted, rx sent for 10 mg TID

## 2020-07-01 ENCOUNTER — Ambulatory Visit (INDEPENDENT_AMBULATORY_CARE_PROVIDER_SITE_OTHER): Payer: Medicare Other | Admitting: General Practice

## 2020-07-01 ENCOUNTER — Other Ambulatory Visit: Payer: Self-pay

## 2020-07-01 DIAGNOSIS — Z7901 Long term (current) use of anticoagulants: Secondary | ICD-10-CM | POA: Diagnosis not present

## 2020-07-01 LAB — POCT INR: INR: 2.2 (ref 2.0–3.0)

## 2020-07-01 NOTE — Progress Notes (Signed)
Medical screening examination/treatment/procedure(s) were performed by non-physician practitioner and as supervising physician I was immediately available for consultation/collaboration. I agree with above. Aquan Kope, MD   

## 2020-07-01 NOTE — Patient Instructions (Addendum)
Pre visit review using our clinic review tool, if applicable. No additional management support is needed unless otherwise documented below in the visit note.  Continue to take 2 tablets daily.  Re-check in 4 weeks. 

## 2020-07-19 ENCOUNTER — Other Ambulatory Visit: Payer: Self-pay

## 2020-07-19 ENCOUNTER — Other Ambulatory Visit (INDEPENDENT_AMBULATORY_CARE_PROVIDER_SITE_OTHER): Payer: Medicare Other

## 2020-07-19 DIAGNOSIS — E039 Hypothyroidism, unspecified: Secondary | ICD-10-CM

## 2020-07-19 DIAGNOSIS — E7800 Pure hypercholesterolemia, unspecified: Secondary | ICD-10-CM

## 2020-07-19 DIAGNOSIS — E1142 Type 2 diabetes mellitus with diabetic polyneuropathy: Secondary | ICD-10-CM | POA: Diagnosis not present

## 2020-07-19 LAB — COMPREHENSIVE METABOLIC PANEL
ALT: 26 U/L (ref 0–35)
AST: 25 U/L (ref 0–37)
Albumin: 4.1 g/dL (ref 3.5–5.2)
Alkaline Phosphatase: 80 U/L (ref 39–117)
BUN: 17 mg/dL (ref 6–23)
CO2: 29 mEq/L (ref 19–32)
Calcium: 9.7 mg/dL (ref 8.4–10.5)
Chloride: 101 mEq/L (ref 96–112)
Creatinine, Ser: 0.91 mg/dL (ref 0.40–1.20)
GFR: 62.98 mL/min (ref >60.00)
Glucose, Bld: 122 mg/dL — ABNORMAL HIGH (ref 70–99)
Potassium: 3.8 mEq/L (ref 3.5–5.1)
Sodium: 137 mEq/L (ref 135–145)
Total Bilirubin: 0.4 mg/dL (ref 0.2–1.2)
Total Protein: 7.3 g/dL (ref 6.0–8.3)

## 2020-07-19 LAB — HEMOGLOBIN A1C: Hgb A1c MFr Bld: 6.5 % (ref 4.6–6.5)

## 2020-07-19 LAB — T4, FREE: Free T4: 0.83 ng/dL (ref 0.60–1.60)

## 2020-07-19 LAB — LIPID PANEL
Cholesterol: 183 mg/dL (ref 0–200)
HDL: 93.5 mg/dL (ref >39.00)
LDL Cholesterol: 80 mg/dL (ref 0–99)
NonHDL: 89.78
Total CHOL/HDL Ratio: 2
Triglycerides: 49 mg/dL (ref 0.0–149.0)
VLDL: 9.8 mg/dL (ref 0.0–40.0)

## 2020-07-19 LAB — TSH: TSH: 2.14 u[IU]/mL (ref 0.35–4.50)

## 2020-07-22 ENCOUNTER — Other Ambulatory Visit: Payer: Self-pay

## 2020-07-22 ENCOUNTER — Ambulatory Visit (INDEPENDENT_AMBULATORY_CARE_PROVIDER_SITE_OTHER): Payer: Medicare Other | Admitting: Endocrinology

## 2020-07-22 ENCOUNTER — Encounter: Payer: Self-pay | Admitting: Endocrinology

## 2020-07-22 VITALS — BP 110/68 | HR 62 | Ht 67.0 in | Wt 213.6 lb

## 2020-07-22 DIAGNOSIS — E039 Hypothyroidism, unspecified: Secondary | ICD-10-CM | POA: Diagnosis not present

## 2020-07-22 DIAGNOSIS — R600 Localized edema: Secondary | ICD-10-CM | POA: Diagnosis not present

## 2020-07-22 DIAGNOSIS — E1142 Type 2 diabetes mellitus with diabetic polyneuropathy: Secondary | ICD-10-CM

## 2020-07-22 DIAGNOSIS — K912 Postsurgical malabsorption, not elsewhere classified: Secondary | ICD-10-CM | POA: Diagnosis not present

## 2020-07-22 DIAGNOSIS — E7800 Pure hypercholesterolemia, unspecified: Secondary | ICD-10-CM

## 2020-07-22 NOTE — Patient Instructions (Signed)
Take Lasix every 2 days 

## 2020-07-22 NOTE — Progress Notes (Signed)
Patient ID: Kelli Martinez, female   DOB: 1946-03-06, 74 y.o.   MRN: [id number]    Chief complaint: Followup of various chronic problems    History of Present Illness:  She is here for follow-up:  1.  Postprandial hypoglycemia related to dumping syndrome   She has had postprandial hypoglycemia starting a couple of years after her gastric bypass surgery Her symptoms of blood sugars are mostly a feeling of significant weakness, some shakiness.  She will use glucose tablets or juice in order to relieve the symptoms, recently she thinks that 1 or 2 glucose tablets are usually adequate She has been to the dietitian and has been instructed on balanced low-fat meals with enough protein consistently and restricting carbohydrates including fruits   Recent history:  She has been treated with acarbose 25 mg before meals  She did try verapamil previously but this did not help.   She is taking her Precose before meals, taking 1 before breakfast, 2 tablet at lunch and 1 at dinnertime   She has had only 1 documented low blood sugar of 61 around midnight She was told to cut back on carbohydrates at lunch and this is helping her afternoon symptoms of hypoglycemia She thinks her portions are small and she is trying to get some protein at all meals such as yogurt in the morning Previously was having episodes of weakness and imbalance during the night when she is getting up to go to the bathroom relieved by eating grapes but she is having less frequent symptoms currently Metformin was stopped on her last visit   2.  SWELLING of the legs:  She has had significant problems with her legs and feet chronically  Swelling increases at the end of the day but is variable from day-to-day Now taking Aldactone 100 mg total per day and she is using 100 mg dose in the morning, the extra 50 mg in the evening was stopped because of low normal blood pressure  Taking 20 mg Lasix daily; gets  cramping with higher doses  As before she does not like to wear compression stockings because of discomfort and difficulty putting them on She thinks her swelling is better with cutting back on her sodium intake  Renal function and potassium are consistently normal  Lab Results  Component Value Date   K 3.8 07/19/2020   Lab Results  Component Value Date   CREATININE 0.91 07/19/2020   CREATININE 0.96 04/20/2020   CREATININE 0.97 12/16/2019     PROBLEM 3:  DIABETES:  This has been mild and well controlled since her gastric bypass surgery  The A1c usually higher than expected, now 6.5  Current management, blood sugar patterns:  She has a fairly good diet  Checks blood sugars sporadically, not always after meals and does not appear to have any consistently high readings including the morning  She is trying to do some exercises at home  Has lost weight and she thinks her portions are small  She was told to stop her Metformin on her last visit because of tendency to hypoglycemia symptoms Also taking Precose at each meal also to prevent reactive hypoglycemia as discussed above  Glucose readings from monitor download:  Glucose values 96, 100 in the morning POSTPRANDIAL 100-166 with only 2 readings over 140 AVERAGE 111  PREVIOUS range 57-111 checked at different times but mostly midday and afternoon and only once after 7 PM AVERAGE 90,    Wt Readings from Last 3 Encounters:  07/22/20 213 lb 9.6 oz (96.9 kg)  04/22/20 218 lb (98.9 kg)  03/23/20 219 lb (99.3 kg)    Lab Results  Component Value Date   HGBA1C 6.5 07/19/2020   HGBA1C 6.9 (H) 04/20/2020   HGBA1C 6.5 12/16/2019   Lab Results  Component Value Date   MICROALBUR <0.7 12/16/2019   [place] 80 07/19/2020   CREATININE 0.91 07/19/2020      Other active problems: see review of systems      Allergies as of 07/22/2020      Reactions   Adhesive [tape] Rash      Medication List       Accurate as of  July 22, 2020 10:15 AM. If you have any questions, ask your nurse or doctor.        STOP taking these medications   metFORMIN 500 MG tablet Commonly known as: GLUCOPHAGE Stopped by: Elayne Snare, MD     TAKE these medications   acarbose 25 MG tablet Commonly known as: PRECOSE Take 25 mg by mouth 3 (three) times daily with meals. Take 1 tablet by mouth at breakfast and lunch, and 2 tablets at dinner.   albuterol 108 (90 Base) MCG/ACT inhaler Commonly known as: ProAir HFA Inhale 2 puffs into the lungs every 6 (six) hours as needed for wheezing or shortness of breath.   azelastine 0.1 % nasal spray Commonly known as: ASTELIN 1-2 puffs each nostril twice daily as needed   baclofen 10 MG tablet Commonly known as: LIORESAL Take 1 tablet (10 mg total) by mouth 3 (three) times daily.   Calcium Carbonate-Vitamin D 600-400 MG-UNIT tablet Take 1 tablet by mouth daily.   clobetasol cream 0.05 % Commonly known as: TEMOVATE APPLY 1 APPLICATION TOPICALLY TWICE A DAY   diazepam 10 MG Gel Commonly known as: DIASTAT ACUDIAL Place 5 mg rectally once as needed (cramping).   ferrous sulfate 325 (65 FE) MG tablet Take 325 mg by mouth daily.   fluticasone 50 MCG/ACT nasal spray Commonly known as: FLONASE Place 2 sprays into both nostrils daily.   Fluticasone-Salmeterol 100-50 MCG/DOSE Aepb Commonly known as: Advair Diskus Inhale 1 puff into the lungs every 12 (twelve) hours. USE 1 INHALATION EVERY 12 HOURS, RINSE MOUTH   FreeStyle Freedom Lite w/Device Kit Use as directed to check blood sugar once daily.   freestyle lancets USE AS INSTRUCTED TO CHECK BLOOD SUGAR ONCE DAILY   FREESTYLE LITE test strip Generic drug: glucose blood USE AS INSTRUCTED TO CHECK BLOOD SUGAR ONCE DAILY   furosemide 20 MG tablet Commonly known as: LASIX Take 2 tablets (40 mg total) by mouth daily for 5 days.   gabapentin 300 MG capsule Commonly known as: NEURONTIN TAKE 2 CAPSULES THREE TIMES A DAY    HYDROcodone-acetaminophen 5-325 MG tablet Commonly known as: Norco Take 1 tablet by mouth 2 (two) times daily as needed for moderate pain. What changed:   how much to take  when to take this  reasons to take this   levothyroxine 88 MCG tablet Commonly known as: SYNTHROID Take 1 tablet (88 mcg total) by mouth daily.   methylcellulose 1 % ophthalmic solution Commonly known as: ARTIFICIAL TEARS Place 1 drop into both eyes as needed. Dry eyes   omeprazole 20 MG capsule Commonly known as: PRILOSEC Take 1 capsule (20 mg total) by mouth daily.   pravastatin 40 MG tablet Commonly known as: PRAVACHOL Take 1 tablet (40 mg total) by mouth daily.   SLOW-MAG PO Take 500 mg by  mouth at bedtime.   spironolactone 50 MG tablet Commonly known as: ALDACTONE Take 1 tablet (50 mg total) by mouth 2 (two) times daily. Take 2 tablets by mouth daily in the morning and 1 tablet in the evening.   temazepam 15 MG capsule Commonly known as: RESTORIL 1-2 for sleep as needed What changed:   how much to take  how to take this  when to take this  reasons to take this   vitamin B-12 500 MCG tablet Commonly known as: CYANOCOBALAMIN Take 500 mcg by mouth daily.   Vitamin D (Ergocalciferol) 1.25 MG (50000 UNIT) Caps capsule Commonly known as: DRISDOL TAKE 1 CAPSULE EVERY FRIDAY What changed: See the new instructions.   warfarin 5 MG tablet Commonly known as: COUMADIN Take as directed by the anticoagulation clinic. If you are unsure how to take this medication, talk to your nurse or doctor. Original instructions: Take 2 tablets (10 mg total) by mouth daily. TAKE 2 TABLETS BY MOUTH ONCE DAILY, EXCEPT WEDNESDAYS. ON Wednesday, TAKE 1 TABLET DAILY. What changed:   how much to take  when to take this  additional instructions       Allergies:  Allergies  Allergen Reactions  . Adhesive [Tape] Rash    Past Medical History:  Diagnosis Date  . Allergy   . Anemia   . Arthritis   .  Asthma   . Back pain, chronic    GETS INJECTIONS IN BACK  . Blind right eye    hemorrhage  . Blood transfusion   . Cancer ([place])    breast 1994  . Clotting disorder ([place])   . Diabetes mellitus without complication ([place])    takes precose  . Elevated liver enzymes   . Gallstones   . GERD (gastroesophageal reflux disease)   . HX: breast cancer   . Hyperlipidemia   . Hypertension   . Hypothyroid   . Lymphedema of arm    RT  . Morbid obesity ([place])   . OSA (obstructive sleep apnea)    has not used in 2 years-lost weight  . Osteoporosis   . Peripheral neuropathy    on gabapentin  . PONV (postoperative nausea and vomiting)   . Psoriasis   . Pulmonary embolism ([place]) 1998 / 1994 /1968  . Syncope and collapse 2011   due to anemia    Past Surgical History:  Procedure Laterality Date  . ABDOMINAL HYSTERECTOMY    . BONE MARROW TRANSPLANT  1994  . CARDIOVASCULAR STRESS TEST  05/23/2005   EF 53%  . carpal tunnell     bil  . cataracts    . CHOLECYSTECTOMY  05/10/2012   Procedure: LAPAROSCOPIC CHOLECYSTECTOMY WITH INTRAOPERATIVE CHOLANGIOGRAM;  Surgeon: Pedro Earls, MD;  Location: [facility];  Service: General;  Laterality: N/A;  Laparoscopic Cholecystectomy with Intraoperative Cholangiogram  . GASTRIC BYPASS  2011   bariatric surgery  . HIATAL HERNIA REPAIR    . IVC filter     recurrent DVT  . KNEE ARTHROTOMY  1998  . MASS EXCISION Right 11/24/2016   Procedure: EXCISION RIGHT AXILLARY LESION AND CHEST WALL LESION;  Surgeon: Johnathan Hausen, MD;  Location: [facility];  Service: General;  Laterality: Right;  . MASTECTOMY     Right  . US ECHOCARDIOGRAPHY  10/27/08   EF 55-60%    Family History  Problem Relation Age of Onset  . Sudden death Mother        car accident  . Cancer Father 20  lung  . Cancer Sister 80       lung cancer  . Asthma Daughter     Social History:  reports that she quit smoking about 42 years ago. Her smoking use included  cigarettes. She has a 5.00 pack-year smoking history. She has never used smokeless tobacco. She reports that she does not drink alcohol and does not use drugs.  Review of Systems    ABNORMAL liver functions: She has had these before transiently and no etiology found Not related to statins  Lab Results  Component Value Date   ALT 26 07/19/2020   ALT 25 01/19/2016     Neuropathy with persistent burning, mild numbness and lower leg pain:  She gets relief with nortriptyline and gabapentin  MUSCLE cramps/spasms: She is being treated with 10 mg baclofen during the day twice daily and 20 mg at bedtime If she has a cramp during the night she will take an extra dose of baclofen 10 mg Symptoms are not controlled consistently She also uses tonic water, mustard and magnesium supplements  No history of hypokalemia  Lymphedema right arm, secondary to  breast surgery, using  elastic compression stocking but not getting enough relief   History of mild hypothyroidism: Has been present for several years and minimally symptomatic usually  She feels fairly good overall Levothyroxine dose: 88 mcg daily  She has nonspecific weakness and fatigue but thyroid levels are consistent   Lab Results  Component Value Date   TSH 2.14 07/19/2020   TSH 2.52 04/20/2020   TSH 2.76 12/16/2019   FREET4 0.83 07/19/2020   FREET4 0.96 04/20/2020   FREET4 1.17 12/16/2019     Hypercholesterolemia: LDL has been at target with pravastatin Labs as below  Lipid levels:  Lab Results  Component Value Date   CHOL 183 07/19/2020   HDL 93.50 07/19/2020   LDLCALC 80 07/19/2020   TRIG 49.0 07/19/2020   CHOLHDL 2 07/19/2020   Lab Results  Component Value Date   ALT 26 07/19/2020   ALT 25 01/19/2016    No history of hypertension Tends to have lower readings of blood pressure standing  BP Readings from Last 3 Encounters:  07/22/20 110/68  04/22/20 112/62  03/23/20 116/64     LABS:  Lab on  07/19/2020  Component Date Value Ref Range Status  . Cholesterol 07/19/2020 183  0 - 200 mg/dL Final   ATP III Classification       Desirable:  < 200 mg/dL               Borderline High:  200 - 239 mg/dL          High:  > = 240 mg/dL  . Triglycerides 07/19/2020 49.0  0.0 - 149.0 mg/dL Final   Normal:  <150 mg/dLBorderline High:  150 - 199 mg/dL  . HDL 07/19/2020 93.50  >39.00 mg/dL Final  . VLDL 07/19/2020 9.8  0.0 - 40.0 mg/dL Final  . LDL Cholesterol 07/19/2020 80  0 - 99 mg/dL Final  . Total CHOL/HDL Ratio 07/19/2020 2   Final                  Men          Women1/2 Average Risk     3.4          3.3Average Risk          5.0          4.42X Average Risk  9.6          7.13X Average Risk          15.0          11.0                      . NonHDL 07/19/2020 89.78   Final   NOTE:  Non-HDL goal should be 30 mg/dL higher than patient's LDL goal (i.e. LDL goal of < 70 mg/dL, would have non-HDL goal of < 100 mg/dL)  . Free T4 07/19/2020 0.83  0.60 - 1.60 ng/dL Final   Comment: Specimens from patients who are undergoing biotin therapy and /or ingesting biotin supplements may contain high levels of biotin.  The higher biotin concentration in these specimens interferes with this Free T4 assay.  Specimens that contain high levels  of biotin may cause false high results for this Free T4 assay.  Please interpret results in light of the total clinical presentation of the patient.    Marland Kitchen TSH 07/19/2020 2.14  0.35 - 4.50 uIU/mL Final  . Sodium 07/19/2020 137  135 - 145 mEq/L Final  . Potassium 07/19/2020 3.8  3.5 - 5.1 mEq/L Final  . Chloride 07/19/2020 101  96 - 112 mEq/L Final  . CO2 07/19/2020 29  19 - 32 mEq/L Final  . Glucose, Bld 07/19/2020 122* 70 - 99 mg/dL Final  . BUN 07/19/2020 17  6 - 23 mg/dL Final  . Creatinine, Ser 07/19/2020 0.91  0.40 - 1.20 mg/dL Final  . Total Bilirubin 07/19/2020 0.4  0.2 - 1.2 mg/dL Final  . Alkaline Phosphatase 07/19/2020 80  39 - 117 U/L Final  . AST 07/19/2020  25  0 - 37 U/L Final  . ALT 07/19/2020 26  0 - 35 U/L Final  . Total Protein 07/19/2020 7.3  6.0 - 8.3 g/dL Final  . Albumin 07/19/2020 4.1  3.5 - 5.2 g/dL Final  . GFR 07/19/2020 62.98  >60.00 mL/min Final   Calculated using the CKD-EPI Creatinine Equation (2021)  . Calcium 07/19/2020 9.7  8.4 - 10.5 mg/dL Final  . Hgb A1c MFr Bld 07/19/2020 6.5  4.6 - 6.5 % Final   Glycemic Control Guidelines for People with Diabetes:Non Diabetic:  <6%Goal of Therapy: <7%Additional Action Suggested:  >8%   Anti-coag visit on 07/01/2020  Component Date Value Ref Range Status  . INR 07/01/2020 2.2  2.0 - 3.0 Final     EXAM:  BP 110/68 (Patient Position: Standing, Cuff Size: Large)   Pulse 62   Ht 5' 7"  (1.702 m)   Wt 213 lb 9.6 oz (96.9 kg)   SpO2 96%   BMI 33.45 kg/m   Right pedal edema present Left lower leg appears to have moderate edema, nonpitting mostly   Assessment/Plan:   1.  EDEMA: This is related to venous insufficiency and idiopathic fluid retention She is on  Aldactone and also on Lasix 20 mg She thinks edema is better with her restricting sodium also    Since she is having some orthostatic symptoms of weakness and occasionally near syncopal-like symptoms she will try to reduce her Lasix to every other day and increase the dose only if needed Continue 100 mg Aldactone daily Continue follow-up with PCP also    2. Reactive hypoglycemia secondary to gastrointestinal surgery: Has one low sugar documented at dinnertime  Less symptomatic with cutting back on portions and carbohydrates and also not taking Metformin She needs to continue eating higher protein  meals and not excessive amounts of carbohydrates for meals and snacks No change in acarbose regimen needed  3. Mild diabetes: A1c 6.5 slightly better  Since home blood sugars are averaging 111 and has only minimal postprandial hyperglycemia at times depending on her carbohydrate intake and no higher fasting readings she will  not resume Metformin With her diet changes she has lost weight, exercise is limited by her continued problems with lumbar spinal stenosis  4.   Primary hypothyroidism: She is taking 88 mcg daily now and her TSH is again normal    5.  Episodes of weakness, gait imbalance and muscle cramps: Recommended that she follow-up with her neurologist   Patient Instructions  Take Lasix every 2 days   Elayne Snare 07/22/2020, 10:15 AM

## 2020-07-29 ENCOUNTER — Other Ambulatory Visit: Payer: Self-pay

## 2020-07-29 ENCOUNTER — Ambulatory Visit (INDEPENDENT_AMBULATORY_CARE_PROVIDER_SITE_OTHER): Payer: Medicare Other | Admitting: General Practice

## 2020-07-29 DIAGNOSIS — Z7901 Long term (current) use of anticoagulants: Secondary | ICD-10-CM

## 2020-07-29 LAB — POCT INR: INR: 2.6 (ref 2.0–3.0)

## 2020-07-29 NOTE — Patient Instructions (Addendum)
Pre visit review using our clinic review tool, if applicable. No additional management support is needed unless otherwise documented below in the visit note.  Continue to take 2 tablets daily. Recheck in 6 weeks. 

## 2020-07-31 ENCOUNTER — Other Ambulatory Visit: Payer: Self-pay | Admitting: Endocrinology

## 2020-08-02 NOTE — Telephone Encounter (Signed)
Warfarin will be prescribed by PCP.  She is not taking Metformin now

## 2020-08-04 ENCOUNTER — Encounter (INDEPENDENT_AMBULATORY_CARE_PROVIDER_SITE_OTHER): Payer: Medicare Other | Admitting: Ophthalmology

## 2020-08-04 ENCOUNTER — Other Ambulatory Visit: Payer: Self-pay

## 2020-08-04 DIAGNOSIS — H43811 Vitreous degeneration, right eye: Secondary | ICD-10-CM

## 2020-08-04 DIAGNOSIS — H35372 Puckering of macula, left eye: Secondary | ICD-10-CM

## 2020-08-04 DIAGNOSIS — H33302 Unspecified retinal break, left eye: Secondary | ICD-10-CM | POA: Diagnosis not present

## 2020-08-04 DIAGNOSIS — H35033 Hypertensive retinopathy, bilateral: Secondary | ICD-10-CM | POA: Diagnosis not present

## 2020-08-04 DIAGNOSIS — H353211 Exudative age-related macular degeneration, right eye, with active choroidal neovascularization: Secondary | ICD-10-CM

## 2020-08-04 DIAGNOSIS — H348322 Tributary (branch) retinal vein occlusion, left eye, stable: Secondary | ICD-10-CM | POA: Diagnosis not present

## 2020-08-04 DIAGNOSIS — I10 Essential (primary) hypertension: Secondary | ICD-10-CM | POA: Diagnosis not present

## 2020-08-06 ENCOUNTER — Telehealth: Payer: Self-pay

## 2020-08-06 DIAGNOSIS — Z09 Encounter for follow-up examination after completed treatment for conditions other than malignant neoplasm: Secondary | ICD-10-CM | POA: Diagnosis not present

## 2020-08-06 DIAGNOSIS — Z853 Personal history of malignant neoplasm of breast: Secondary | ICD-10-CM | POA: Diagnosis not present

## 2020-08-09 DIAGNOSIS — G894 Chronic pain syndrome: Secondary | ICD-10-CM | POA: Diagnosis not present

## 2020-08-09 DIAGNOSIS — E1140 Type 2 diabetes mellitus with diabetic neuropathy, unspecified: Secondary | ICD-10-CM | POA: Diagnosis not present

## 2020-08-16 NOTE — Progress Notes (Signed)
Medical screening examination/treatment/procedure(s) were performed by non-physician practitioner and as supervising physician I was immediately available for consultation/collaboration. I agree with above. Elic Vencill, MD  

## 2020-08-25 ENCOUNTER — Other Ambulatory Visit: Payer: Self-pay | Admitting: Surgery

## 2020-08-25 DIAGNOSIS — Z0000 Encounter for general adult medical examination without abnormal findings: Secondary | ICD-10-CM

## 2020-09-09 ENCOUNTER — Other Ambulatory Visit: Payer: Self-pay

## 2020-09-09 ENCOUNTER — Ambulatory Visit (INDEPENDENT_AMBULATORY_CARE_PROVIDER_SITE_OTHER): Payer: Medicare Other | Admitting: General Practice

## 2020-09-09 DIAGNOSIS — Z7901 Long term (current) use of anticoagulants: Secondary | ICD-10-CM

## 2020-09-09 LAB — POCT INR: INR: 3.4 — AB (ref 2.0–3.0)

## 2020-09-09 NOTE — Patient Instructions (Addendum)
Pre visit review using our clinic review tool, if applicable. No additional management support is needed unless otherwise documented below in the visit note.  Skip dosage tomorrow and then change dosage and take to take 2 tablets daily except 1 tablet on Wednesdays.  Re-check in 4 weeks.

## 2020-09-09 NOTE — Progress Notes (Signed)
Medical screening examination/treatment/procedure(s) were performed by non-physician practitioner and as supervising physician I was immediately available for consultation/collaboration. I agree with above. Deberah Adolf, MD   

## 2020-09-13 DIAGNOSIS — L603 Nail dystrophy: Secondary | ICD-10-CM | POA: Diagnosis not present

## 2020-09-13 DIAGNOSIS — E1151 Type 2 diabetes mellitus with diabetic peripheral angiopathy without gangrene: Secondary | ICD-10-CM | POA: Diagnosis not present

## 2020-09-13 DIAGNOSIS — I739 Peripheral vascular disease, unspecified: Secondary | ICD-10-CM | POA: Diagnosis not present

## 2020-09-13 DIAGNOSIS — L84 Corns and callosities: Secondary | ICD-10-CM | POA: Diagnosis not present

## 2020-09-22 NOTE — Progress Notes (Signed)
Subjective:    Patient ID: Kelli Martinez, female    DOB: 1946/03/31, 75 y.o.   MRN: [id number]  HPI F former smoker followed for OSA/ quit CPAP, allergic rhinitis,asthma,  hx pulm embolism/DVT/filter/coumadin(Dr Dwyane Dee), hx R breast Ca/ mastectomy/ xrt/chem/[facility]arrow [facility] at [facility], hx gastric bypass, DM 2 Office Spirometry 04/27/2015-WNL-FEV1/FVC 0.81, FEV1 2.50/110% HST- 12/31/19- AHI 9.1/ hr, desaturation to 88%, body weight 219 lbs ----------------------------------------------------------------------------------------  03/23/20- 74 yoF former smoker followed for Allergic Rhinitis, Asthma, OSA/ quit CPAP, insomnia,   hx pulm embolism/DVT/filter/coumadin(Dr Dwyane Dee), hx R breast Ca/ mastectomy/ xrt/chem/bone marrow transplant at [place], hx gastric bypass, DM 2, Chronic peripheral Edema Dr Dwyane Dee manages peripheral edema. Back pain disturbs sleep. -----osa, asthma,wants to discuss nasal spray,Astilin vs Flonase, review HST, wakes at times snoring, tired during day HST- 12/31/19- AHI 9.1/ hr, desaturation to 88%, body weight 219 lbs Body weight today 219 lbs Lives alone- sister has told her she is not snoring. Proair hfa, flonase, Advair 100, temazepam 15,   Had 2 Phizer Covaxx Diuretic wakes her frequently w nocturia.  Wants to wait on CPAP  09/23/20- 74 yoF former smoker followed for Allergic Rhinitis, Asthma, OSA/ quit CPAP, insomnia,   hx pulm embolism/DVT/filter/coumadin(Dr Dwyane Dee), hx R breast Ca/ mastectomy/ xrt/chem/bone marrow transplant at [place], hx gastric bypass, DM 2, Chronic peripheral Edema Dr Dwyane Dee manages peripheral edema. -Albuterol hfa, Advair 100,  Body weight today-215 lbs Covid vax- 3 Moderna Flu vax-had ACT score 14 Wakes every few hours- back pain and cramps, frequent position changes. Temazepam does help. Continues Advair, using rescue hfa 2x/ week with no recent exacerbation.  We discussed sleep study and option not to treat  mild OSA.  Review of Systems- see  HPI + = positive Constitutional:   No-   weight loss, night sweats, fevers, chills, fatigue, lassitude. HEENT:   No-  headaches, difficulty swallowing, tooth/dental problems, sore throat,   +dry mouth      No-  sneezing, itching, ear ache,  +nasal congestion, +post nasal drip, +blind R eye CV:  No-   chest pain, orthopnea, PND, swelling in lower extremities, anasarca, dizziness, palpitations Resp: No-   shortness of breath with exertion or at rest.              No-   productive cough,   non-productive cough,  No-  coughing up of blood.              No-   change in color of mucus.   Skin: No-   rash or lesions. GI:  No-   heartburn, indigestion, abdominal pain, nausea, vomiting,  GU:  MS:  No-   joint pain,    swelling.   Neuro- : +HPI Psych:  No- change in mood or affect. No depression or anxiety.  No memory loss.   Objective:   Physical Exam General- Alert, Oriented, Affect-appropriate, Distress- none acute  + obese( most of weight is in her legs and hips) Skin- rash-none, lesions- none, excoriation- none. XRT skin changes right lateral neck Lymphadenopathy- none Head- atraumatic            Eyes- +blind R eye,  clear conjunctivae            Ears- Hearing, canals, TMs normal            Nose- Clear, no- Septal dev, + sticky mucus, polyps, erosion, perforation             Throat- Mallampati III , mucosa clear , drainage- none,  tonsils- atrophic, +missing teeth Neck- flexible , trachea midline, no stridor , thyroid nl, carotid no bruit Chest - symmetrical excursion , unlabored           Heart/CV- RRR , no murmur , no gallop  , no rub, nl s1 s2                           - JVD+1, edema+3, stasis changes- , varices- none           Lung- clear to P&A, wheeze-none, cough- none , dullness-none, rub- none           Chest wall- + right mastectomy Abd-  Br/ Gen/ Rectal- Not done, not indicated Extrem- cyanosis- none, clubbing- none, atrophy- none, strength- nl   Heavy legs. + cane Neuro-  grossly intact to observation  Assessment & Plan:

## 2020-09-23 ENCOUNTER — Encounter: Payer: Self-pay | Admitting: Internal Medicine

## 2020-09-23 ENCOUNTER — Ambulatory Visit (INDEPENDENT_AMBULATORY_CARE_PROVIDER_SITE_OTHER): Payer: Medicare Other | Admitting: Internal Medicine

## 2020-09-23 ENCOUNTER — Other Ambulatory Visit: Payer: Self-pay

## 2020-09-23 DIAGNOSIS — F5101 Primary insomnia: Secondary | ICD-10-CM | POA: Diagnosis not present

## 2020-09-23 DIAGNOSIS — G4733 Obstructive sleep apnea (adult) (pediatric): Secondary | ICD-10-CM | POA: Diagnosis not present

## 2020-09-23 DIAGNOSIS — J4530 Mild persistent asthma, uncomplicated: Secondary | ICD-10-CM | POA: Diagnosis not present

## 2020-09-23 NOTE — Patient Instructions (Signed)
We can continue temazepam for sleep  We can continue your inhalers  Please call if we can help

## 2020-09-25 NOTE — Assessment & Plan Note (Signed)
Mild persistent uncomplicated.  Plan- continue Advair with occasional rescue as before.

## 2020-09-25 NOTE — Assessment & Plan Note (Signed)
Temazepam has been helpful. Best not to push for something stronger now.  Sleep hygiene reviewed.

## 2020-09-25 NOTE — Assessment & Plan Note (Signed)
She is going to try to lose some weight.

## 2020-09-27 ENCOUNTER — Encounter: Payer: Self-pay | Admitting: Family Medicine

## 2020-09-27 ENCOUNTER — Ambulatory Visit (INDEPENDENT_AMBULATORY_CARE_PROVIDER_SITE_OTHER): Payer: Medicare Other | Admitting: Family Medicine

## 2020-09-27 ENCOUNTER — Other Ambulatory Visit: Payer: Self-pay

## 2020-09-27 VITALS — BP 130/80 | HR 83 | Temp 97.9°F | Ht 67.0 in | Wt 221.4 lb

## 2020-09-27 DIAGNOSIS — I872 Venous insufficiency (chronic) (peripheral): Secondary | ICD-10-CM

## 2020-09-27 DIAGNOSIS — K219 Gastro-esophageal reflux disease without esophagitis: Secondary | ICD-10-CM | POA: Diagnosis not present

## 2020-09-27 DIAGNOSIS — Z0000 Encounter for general adult medical examination without abnormal findings: Secondary | ICD-10-CM | POA: Diagnosis not present

## 2020-09-27 DIAGNOSIS — R1013 Epigastric pain: Secondary | ICD-10-CM

## 2020-09-27 DIAGNOSIS — R252 Cramp and spasm: Secondary | ICD-10-CM

## 2020-09-27 DIAGNOSIS — E039 Hypothyroidism, unspecified: Secondary | ICD-10-CM

## 2020-09-27 DIAGNOSIS — E1142 Type 2 diabetes mellitus with diabetic polyneuropathy: Secondary | ICD-10-CM

## 2020-09-27 DIAGNOSIS — H5440 Blindness, one eye, unspecified eye: Secondary | ICD-10-CM

## 2020-09-27 LAB — CBC WITH DIFFERENTIAL/PLATELET
Basophils Absolute: 0.1 10*3/uL (ref 0.0–0.1)
Basophils Relative: 1.6 % (ref 0.0–3.0)
Eosinophils Absolute: 0.1 10*3/uL (ref 0.0–0.7)
Eosinophils Relative: 2.6 % (ref 0.0–5.0)
HCT: 37.3 % (ref 36.0–46.0)
Hemoglobin: 12.2 g/dL (ref 12.0–15.0)
Lymphocytes Relative: 32.1 % (ref 12.0–46.0)
Lymphs Abs: 1.2 10*3/uL (ref 0.7–4.0)
MCHC: 32.7 g/dL (ref 30.0–36.0)
MCV: 87 fl (ref 78.0–100.0)
Monocytes Absolute: 0.2 10*3/uL (ref 0.1–1.0)
Monocytes Relative: 5.8 % (ref 3.0–12.0)
Neutro Abs: 2.2 10*3/uL (ref 1.4–7.7)
Neutrophils Relative %: 57.9 % (ref 43.0–77.0)
Platelets: 161 10*3/uL (ref 150.0–400.0)
RBC: 4.29 Mil/uL (ref 3.87–5.11)
RDW: 14.2 % (ref 11.5–15.5)
WBC: 3.8 10*3/uL — ABNORMAL LOW (ref 4.0–10.5)

## 2020-09-27 LAB — COMPREHENSIVE METABOLIC PANEL
ALT: 26 U/L (ref 0–35)
AST: 24 U/L (ref 0–37)
Albumin: 4.1 g/dL (ref 3.5–5.2)
Alkaline Phosphatase: 78 U/L (ref 39–117)
BUN: 13 mg/dL (ref 6–23)
CO2: 29 mEq/L (ref 19–32)
Calcium: 9.4 mg/dL (ref 8.4–10.5)
Chloride: 103 mEq/L (ref 96–112)
Creatinine, Ser: 0.82 mg/dL (ref 0.40–1.20)
GFR: 71.27 mL/min (ref >60.00)
Glucose, Bld: 91 mg/dL (ref 70–99)
Potassium: 3.9 mEq/L (ref 3.5–5.1)
Sodium: 139 mEq/L (ref 135–145)
Total Bilirubin: 0.3 mg/dL (ref 0.2–1.2)
Total Protein: 6.8 g/dL (ref 6.0–8.3)

## 2020-09-27 LAB — VITAMIN D 25 HYDROXY (VIT D DEFICIENCY, FRACTURES): VITD: 62.25 ng/mL (ref 30.00–100.00)

## 2020-09-27 LAB — MAGNESIUM: Magnesium: 2 mg/dL (ref 1.5–2.5)

## 2020-09-27 LAB — VITAMIN B12: Vitamin B-12: 1002 pg/mL — ABNORMAL HIGH (ref 211–911)

## 2020-09-27 LAB — TSH: TSH: 1.31 u[IU]/mL (ref 0.35–4.50)

## 2020-09-27 LAB — HEMOGLOBIN A1C: Hgb A1c MFr Bld: 6.5 % (ref 4.6–6.5)

## 2020-09-27 LAB — BRAIN NATRIURETIC PEPTIDE: Pro B Natriuretic peptide (BNP): 186 pg/mL — ABNORMAL HIGH (ref 0.0–100.0)

## 2020-09-27 LAB — FOLATE: Folate: 8.5 ng/mL (ref >5.9)

## 2020-09-27 NOTE — Progress Notes (Signed)
Subjective:    Kelli Martinez is a 75 y.o. female who presents for Medicare Annual/Subsequent preventive examination.  Preventive Screening-Counseling & Management  Tobacco Social History   Tobacco Use  Smoking Status Former Smoker  . Packs/day: 0.50  . Years: 10.00  . Pack years: 5.00  . Types: Cigarettes  . Quit date: 07/31/1978  . Years since quitting: 42.1  Smokeless Tobacco Never Used     Knee pain, back pain and spasms.  Pain in back noted as a nerve pain.  Seeing Ortho.   Followed by Dr. Dwyane Dee for DM, hypothyroidism.  Has mammogram scheduled next month.   Bind in R eye.  Taking spironolactone and lasix 44m qod.  Decreased sodium intake.  Having muscle spasms.  Had 2 falls.  One happened after turning too fast in the kitchen.  Taking slow mag 500 mg/d.  Having difficulty finding vitamins/supplements in the store.  [facility]- Dr. YAnnamaria Boots  Former smoker.    Current Problems (verified) Patient Active Problem List   Diagnosis Date Noted  . Hypoglycemia following gastrointestinal surgery 03/19/2018  . Edema of both lower extremities due to peripheral venous insufficiency 11/20/2017  . Maxillary sinusitis, acute 08/31/2017  . Encounter for therapeutic drug monitoring 08/26/2013  . Insomnia 05/01/2013  . Long term (current) use of anticoagulants 04/15/2013  . Postsurgical dumping syndrome 04/06/2013  . Bilateral leg edema 04/06/2013  . Reactive hypoglycemia 04/02/2013  . Abdominal pain intermittent-evalulating for internal hernia 11/07/2012  . S/P laparoscopic cholecystectomy 05/31/2012  . [facility]  . Unspecified deficiency anemia 11/14/2011  . Left leg paresthesias 10/27/2010  . Hypothyroid 10/27/2010  . Low back pain 10/27/2010  . Obstructive sleep apnea 09/29/2007  . PULMONARY EMBOLISM 09/25/2007  . Seasonal and perennial allergic rhinitis 09/25/2007  . Allergic asthma, mild persistent, uncomplicated [id number] .  BREAST CANCER, HX OF 09/25/2007    Medications Prior to Visit Current Outpatient Medications on File Prior to Visit  Medication Sig Dispense Refill  . acarbose (PRECOSE) 25 MG tablet Take 25 mg by mouth 3 (three) times daily with meals. Take 1 tablet by mouth at breakfast and lunch, and 2 tablets at dinner.    .Marland Kitchenalbuterol (PROAIR HFA) 108 (90 Base) MCG/ACT inhaler Inhale 2 puffs into the lungs every 6 (six) hours as needed for wheezing or shortness of breath. 24 g 2  . azelastine (ASTELIN) 0.1 % nasal spray 1-2 puffs each nostril twice daily as needed 90 mL 3  . baclofen (LIORESAL) 10 MG tablet Take 1 tablet (10 mg total) by mouth 3 (three) times daily. 270 tablet 1  . Blood Glucose Monitoring Suppl (FREESTYLE FREEDOM LITE) w/Device KIT Use as directed to check blood sugar once daily. 1 kit 0  . Calcium Carbonate-Vitamin D 600-400 MG-UNIT tablet Take 1 tablet by mouth daily.    . clobetasol cream (TEMOVATE) 09.93% APPLY 1 APPLICATION TOPICALLY TWICE A DAY 120 g 1  . diazepam (DIASTAT ACUDIAL) 10 MG GEL Place 5 mg rectally once as needed (cramping). 2 Package 0  . ferrous sulfate 325 (65 FE) MG tablet Take 325 mg by mouth daily.    . fluticasone (FLONASE) 50 MCG/ACT nasal spray Place 2 sprays into both nostrils daily. 48 g 3  . Fluticasone-Salmeterol (ADVAIR DISKUS) 100-50 MCG/DOSE AEPB Inhale 1 puff into the lungs every 12 (twelve) hours. USE 1 INHALATION EVERY 12 HOURS, RINSE MOUTH 120 each 2  . FREESTYLE LITE test strip USE AS INSTRUCTED TO  CHECK BLOOD SUGAR ONCE DAILY 100 strip 3  . gabapentin (NEURONTIN) 300 MG capsule TAKE 2 CAPSULES THREE TIMES A DAY 63 capsule 0  . HYDROcodone-acetaminophen (NORCO) 5-325 MG tablet Take 1 tablet by mouth 2 (two) times daily as needed for moderate pain. (Patient taking differently: Take 0.5-1 tablets by mouth 3 (three) times daily as needed for moderate pain or severe pain.) 50 tablet 0  . Lancets (FREESTYLE) lancets USE AS INSTRUCTED TO CHECK BLOOD SUGAR  ONCE DAILY 100 each 1  . levothyroxine (SYNTHROID) 88 MCG tablet Take 1 tablet (88 mcg total) by mouth daily. 90 tablet 3  . Magnesium Cl-Calcium Carbonate (SLOW-MAG PO) Take 500 mg by mouth at bedtime.     . methylcellulose (ARTIFICIAL TEARS) 1 % ophthalmic solution Place 1 drop into both eyes as needed. Dry eyes    . omeprazole (PRILOSEC) 20 MG capsule Take 1 capsule (20 mg total) by mouth daily. 90 capsule 3  . pravastatin (PRAVACHOL) 40 MG tablet Take 1 tablet (40 mg total) by mouth daily. 90 tablet 3  . spironolactone (ALDACTONE) 50 MG tablet Take 1 tablet (50 mg total) by mouth 2 (two) times daily. Take 2 tablets by mouth daily in the morning and 1 tablet in the evening. 180 tablet 2  . temazepam (RESTORIL) 15 MG capsule 1-2 for sleep as needed (Patient taking differently: Take 15-30 mg by mouth at bedtime as needed for sleep. 1-2 for sleep as needed) 90 capsule 1  . vitamin B-12 (CYANOCOBALAMIN) 500 MCG tablet Take 500 mcg by mouth daily.    Marland Kitchen warfarin (COUMADIN) 5 MG tablet Take 2 tablets (10 mg total) by mouth daily. TAKE 2 TABLETS BY MOUTH ONCE DAILY, EXCEPT WEDNESDAYS. ON Wednesday, TAKE 1 TABLET DAILY. (Patient taking differently: Take 5-10 mg by mouth See admin instructions. Take 2 tablets (43m) by mouth once daily, except wednesdays. On wednesdays, take 1 tablet (544m by mouth daily.) 205 tablet 3  . furosemide (LASIX) 20 MG tablet Take 2 tablets (40 mg total) by mouth daily for 5 days. 10 tablet 0  . metFORMIN (GLUCOPHAGE) 500 MG tablet TAKE 2 TABLETS ONCE DAILY AT BEDTIME (Patient not taking: No sig reported) 180 tablet 3  . Vitamin D, Ergocalciferol, (DRISDOL) 1.25 MG (50000 UNIT) CAPS capsule TAKE 1 CAPSULE EVERY FRIDAY (Patient not taking: Reported on 09/27/2020) 12 capsule 3   No current facility-administered medications on file prior to visit.    Current Medications (verified) Current Outpatient Medications  Medication Sig Dispense Refill  . acarbose (PRECOSE) 25 MG tablet  Take 25 mg by mouth 3 (three) times daily with meals. Take 1 tablet by mouth at breakfast and lunch, and 2 tablets at dinner.    . Marland Kitchenlbuterol (PROAIR HFA) 108 (90 Base) MCG/ACT inhaler Inhale 2 puffs into the lungs every 6 (six) hours as needed for wheezing or shortness of breath. 24 g 2  . azelastine (ASTELIN) 0.1 % nasal spray 1-2 puffs each nostril twice daily as needed 90 mL 3  . baclofen (LIORESAL) 10 MG tablet Take 1 tablet (10 mg total) by mouth 3 (three) times daily. 270 tablet 1  . Blood Glucose Monitoring Suppl (FREESTYLE FREEDOM LITE) w/Device KIT Use as directed to check blood sugar once daily. 1 kit 0  . Calcium Carbonate-Vitamin D 600-400 MG-UNIT tablet Take 1 tablet by mouth daily.    . clobetasol cream (TEMOVATE) 0.0.35 APPLY 1 APPLICATION TOPICALLY TWICE A DAY 120 g 1  . diazepam (DIASTAT ACUDIAL) 10 MG GEL  Place 5 mg rectally once as needed (cramping). 2 Package 0  . ferrous sulfate 325 (65 FE) MG tablet Take 325 mg by mouth daily.    . fluticasone (FLONASE) 50 MCG/ACT nasal spray Place 2 sprays into both nostrils daily. 48 g 3  . Fluticasone-Salmeterol (ADVAIR DISKUS) 100-50 MCG/DOSE AEPB Inhale 1 puff into the lungs every 12 (twelve) hours. USE 1 INHALATION EVERY 12 HOURS, RINSE MOUTH 120 each 2  . FREESTYLE LITE test strip USE AS INSTRUCTED TO CHECK BLOOD SUGAR ONCE DAILY 100 strip 3  . gabapentin (NEURONTIN) 300 MG capsule TAKE 2 CAPSULES THREE TIMES A DAY 63 capsule 0  . HYDROcodone-acetaminophen (NORCO) 5-325 MG tablet Take 1 tablet by mouth 2 (two) times daily as needed for moderate pain. (Patient taking differently: Take 0.5-1 tablets by mouth 3 (three) times daily as needed for moderate pain or severe pain.) 50 tablet 0  . Lancets (FREESTYLE) lancets USE AS INSTRUCTED TO CHECK BLOOD SUGAR ONCE DAILY 100 each 1  . levothyroxine (SYNTHROID) 88 MCG tablet Take 1 tablet (88 mcg total) by mouth daily. 90 tablet 3  . Magnesium Cl-Calcium Carbonate (SLOW-MAG PO) Take 500 mg by  mouth at bedtime.     . methylcellulose (ARTIFICIAL TEARS) 1 % ophthalmic solution Place 1 drop into both eyes as needed. Dry eyes    . omeprazole (PRILOSEC) 20 MG capsule Take 1 capsule (20 mg total) by mouth daily. 90 capsule 3  . pravastatin (PRAVACHOL) 40 MG tablet Take 1 tablet (40 mg total) by mouth daily. 90 tablet 3  . spironolactone (ALDACTONE) 50 MG tablet Take 1 tablet (50 mg total) by mouth 2 (two) times daily. Take 2 tablets by mouth daily in the morning and 1 tablet in the evening. 180 tablet 2  . temazepam (RESTORIL) 15 MG capsule 1-2 for sleep as needed (Patient taking differently: Take 15-30 mg by mouth at bedtime as needed for sleep. 1-2 for sleep as needed) 90 capsule 1  . vitamin B-12 (CYANOCOBALAMIN) 500 MCG tablet Take 500 mcg by mouth daily.    Marland Kitchen warfarin (COUMADIN) 5 MG tablet Take 2 tablets (10 mg total) by mouth daily. TAKE 2 TABLETS BY MOUTH ONCE DAILY, EXCEPT WEDNESDAYS. ON Wednesday, TAKE 1 TABLET DAILY. (Patient taking differently: Take 5-10 mg by mouth See admin instructions. Take 2 tablets (8m) by mouth once daily, except wednesdays. On wednesdays, take 1 tablet (510m by mouth daily.) 205 tablet 3  . furosemide (LASIX) 20 MG tablet Take 2 tablets (40 mg total) by mouth daily for 5 days. 10 tablet 0  . metFORMIN (GLUCOPHAGE) 500 MG tablet TAKE 2 TABLETS ONCE DAILY AT BEDTIME (Patient not taking: No sig reported) 180 tablet 3  . Vitamin D, Ergocalciferol, (DRISDOL) 1.25 MG (50000 UNIT) CAPS capsule TAKE 1 CAPSULE EVERY FRIDAY (Patient not taking: Reported on 09/27/2020) 12 capsule 3   No current facility-administered medications for this visit.     Allergies (verified) Adhesive [tape]   PAST HISTORY  Family History Family History  Problem Relation Age of Onset  . Sudden death Mother        car accident  . Cancer Father [id number]     lung  . Cancer Sister [id number]     lung cancer  . Asthma Daughter     Social History Social History   Tobacco Use  . Smoking  status: Former Smoker    Packs/day: 0.50    Years: 10.00    Pack years: 5.00  Types: Cigarettes    Quit date: 07/31/1978    Years since quitting: 42.1  . Smokeless tobacco: Never Used  Substance Use Topics  . Alcohol use: No     Are there smokers in your home (other than you)? No  Risk Factors Current exercise habits: Exercise is limited by LE edema and balance..  Dietary issues discussed: balanced diet, decreased carbs   Cardiac risk factors: advanced age (older than 65 for men, 70 for women), diabetes mellitus, dyslipidemia, hypertension, obesity (BMI >= 30 kg/m2) and smoking/ tobacco exposure.  Depression Screen  Over the past two weeks, have you felt down, depressed or hopeless? Yes  Over the past two weeks, have you felt little interest or pleasure in doing things? No  Have you lost interest or pleasure in daily life? No  Do you often feel hopeless? No  Do you cry easily over simple problems? No  Activities of Daily Living In your present state of health, do you have any difficulty performing the following activities?:  Driving? No Managing money?  No Feeding yourself? No Getting from bed to chair? No Climbing a flight of stairs? No Preparing food and eating?: No Bathing or showering? No Getting dressed: No Getting to the toilet? No Using the toilet:No Moving around from place to place: Yes In the past year have you fallen or had a near fall?:Yes  Hearing Difficulties: No Do you often ask people to speak up or repeat themselves? At times 2/2 face mask   Do you feel that you have a problem with memory? No  Do you often misplace items? No  Do you feel safe at home?  Yes  Cognitive Testing  Alert? Yes  Normal Appearance?Yes  Oriented to person? Yes  Place? Yes   Time? Yes  Displays appropriate judgment?Yes   List the Names of Other Physician/Practitioners you currently use: 1.  Dr. Dwyane Dee, Endo Dr. Shelbie Proctor? Dr. Annamaria Boots, Pulmonology  Indicate any recent Medical  Services you may have received from other than [facility] providers in the past year (date may be approximate).  Immunization History  Administered Date(s) Administered  . Fluad Quad(high Dose 65+) 03/20/2019, 05/20/2020  . Influenza Split 04/23/2012  . Influenza Whole 03/31/2009  . Influenza, High Dose Seasonal PF 04/07/2015, 04/21/2016, 04/27/2017, 04/23/2018  . Influenza,inj,Quad PF,6+ Mos 04/21/2014  . Influenza-Unspecified 05/09/2017  . PFIZER(Purple Top)SARS-COV-2 Vaccination 09/14/2019, 10/07/2019, 06/25/2020  . Pneumococcal Conjugate-13 07/06/2014  . Pneumococcal Polysaccharide-23 09/06/1998, 06/19/2019    Screening Tests Health Maintenance  Topic Date Due  . Hepatitis C Screening  Never done  . OPHTHALMOLOGY EXAM  Never done  . COLONOSCOPY (Pts 45-65yr Insurance coverage will need to be confirmed)  07/31/2013  . MAMMOGRAM  11/16/2017  . TETANUS/TDAP  08/01/2019  . FOOT EXAM  09/23/2020  . URINE MICROALBUMIN  12/15/2020  . COVID-19 Vaccine (4 - [place]oster for [place]) 12/23/2020  . HEMOGLOBIN A1C  01/17/2021  . INFLUENZA VACCINE  Completed  . DEXA SCAN  Completed  . PNA vac Low Risk Adult  Completed    All answers were reviewed with the patient and necessary referrals were made:  SBillie Ruddy MD   09/27/2020   History reviewed: allergies, current medications, past family history, past medical history, past social history, past surgical history and problem list  Review of Systems Pertinent items noted in HPI and remainder of comprehensive ROS otherwise negative.    Objective:      Body mass index is 34.68 kg/m. BP 130/80 (BP Location:  Left Arm, Patient Position: Sitting, Cuff Size: Large)   Pulse 83   Temp 97.9 F (36.6 C) (Oral)   Ht 5' 7"  (1.702 m)   Wt 221 lb 6.4 oz (100.4 kg)   SpO2 90%   BMI 34.68 kg/m   General appearance: alert, cooperative and no distress Head: Normocephalic, without obvious abnormality, atraumatic Eyes: conjunctivae/corneas  clear. PERRL, EOM's intact. Fundi benign. wearing glasses Ears: normal TM's and external ear canals both ears Nose: Nares normal. Septum midline. Mucosa normal. No drainage or sinus tenderness. Throat: lips, mucosa, and tongue normal; teeth and gums normal Neck: no adenopathy, no carotid bruit, no JVD, supple, symmetrical, trachea midline and thyroid not enlarged, symmetric, no tenderness/mass/nodules Lungs: clear to auscultation bilaterally Heart: regular rate and rhythm, S1, S2 normal, no murmur, click, rub or gallop Abdomen: soft, non-tender; bowel sounds normal; no masses,  no organomegaly Extremities: edema :b/l pedal edema, RUE lymphedema-wearing compression sleeve. Pulses: 2+ and symmetric Skin: Skin color, texture, turgor normal. No rashes or lesions Lymph nodes: Cervical, supraclavicular, and axillary nodes normal. Neurologic: A&Ox 3, CN II-XII grossly intact, ambulating with a cane.     Assessment:     Pt is a 75 yo female here for AWV.     Plan:     During the course of the visit the patient was educated and counseled about appropriate screening and preventive services including:    Screening mammography  Colorectal cancer screening  Nutrition counseling   Advanced directives  Diet review for nutrition referral? Yes ____  Not Indicated __x__  Medicare annual wellness visit, subsequent  Muscle cramping -discussed possible causes including electrolyte deficiency, vitamin deficiency, also consider medication such as statin -Consider holding atorvastatin 40 mg x 1 wk to see if spasms improve.  - Plan: CMP, Magnesium, Vitamin D, 25-hydroxy, Vitamin B12, Folate  Blind, unilateral  Epigastric pain  - Plan: CMP, CBC with Differential/Platelet, Vitamin B12, Folate  Gastroesophageal reflux disease, unspecified whether esophagitis present  - Plan: Vitamin B12  Hypothyroidism, unspecified type -TSH 2.41 on 07/19/2020 -Continue Synthroid 88 mcg daily - Plan: TSH,  Vitamin D, 25-hydroxy  DM type 2 with diabetic peripheral neuropathy ([place])  -continue current meds -continue f/u with Endo - Plan: Hemoglobin A1c, Vitamin D, 25-hydroxy, Vitamin B12, Folate  Edema of both lower extremities due to peripheral venous insufficiency  -likely 2/2 h/o venous insufficiency -discussed supportive care including elevating LEs, compression socks, TED hose. - Plan: CMP, Magnesium, Brain Natriuretic Peptide   Patient Instructions (the written plan) was given to the patient.  Medicare Attestation I have personally reviewed: The patient's medical and social history Their use of alcohol, tobacco or illicit drugs Their current medications and supplements The patient's functional ability including ADLs,fall risks, home safety risks, cognitive, and hearing and visual impairment Diet and physical activities Evidence for depression or mood disorders  The patient's weight, height, BMI, and visual acuity have been recorded in the chart.  I have made referrals, counseling, and provided education to the patient based on review of the above and I have provided the patient with a written personalized care plan for preventive services.     Billie Ruddy, MD   09/27/2020

## 2020-09-27 NOTE — Patient Instructions (Addendum)
You can take Lasix 20 mg daily and spironolactone 100 mg daily.  We will continue to monitor you to see if we need to make further adjustments with the dose of medications.  Preventive Care 20 Years and Older, Female Preventive care refers to lifestyle choices and visits with your health care provider that can promote health and wellness. This includes:  A yearly physical exam. This is also called an annual wellness visit.  Regular dental and eye exams.  Immunizations.  Screening for certain conditions.  Healthy lifestyle choices, such as: ? Eating a healthy diet. ? Getting regular exercise. ? Not using drugs or products that contain nicotine and tobacco. ? Limiting alcohol use. What can I expect for my preventive care visit? Physical exam Your health care provider will check your:  Height and weight. These may be used to calculate your BMI (body mass index). BMI is a measurement that tells if you are at a healthy weight.  Heart rate and blood pressure.  Body temperature.  Skin for abnormal spots. Counseling Your health care provider may ask you questions about your:  Past medical problems.  Family's medical history.  Alcohol, tobacco, and drug use.  Emotional well-being.  Home life and relationship well-being.  Sexual activity.  Diet, exercise, and sleep habits.  History of falls.  Memory and ability to understand (cognition).  Work and work Statistician.  Pregnancy and menstrual history.  Access to firearms. What immunizations do I need? Vaccines are usually given at various ages, according to a schedule. Your health care provider will recommend vaccines for you based on your age, medical history, and lifestyle or other factors, such as travel or where you work.   What tests do I need? Blood tests  Lipid and cholesterol levels. These may be checked every 5 years, or more often depending on your overall health.  Hepatitis C test.  Hepatitis B  test. Screening  Lung cancer screening. You may have this screening every year starting at age 4 if you have a 30-pack-year history of smoking and currently smoke or have quit within the past 15 years.  Colorectal cancer screening. ? All adults should have this screening starting at age 17 and continuing until age 53. ? Your health care provider may recommend screening at age 76 if you are at increased risk. ? You will have tests every 1-10 years, depending on your results and the type of screening test.  Diabetes screening. ? This is done by checking your blood sugar (glucose) after you have not eaten for a while (fasting). ? You may have this done every 1-3 years.  Mammogram. ? This may be done every 1-2 years. ? Talk with your health care provider about how often you should have regular mammograms.  Abdominal aortic aneurysm (AAA) screening. You may need this if you are a current or former smoker.  BRCA-related cancer screening. This may be done if you have a family history of breast, ovarian, tubal, or peritoneal cancers. Other tests  STD (sexually transmitted disease) testing, if you are at risk.  Bone density scan. This is done to screen for osteoporosis. You may have this done starting at age 67. Talk with your health care provider about your test results, treatment options, and if necessary, the need for more tests. Follow these instructions at home: Eating and drinking  Eat a diet that includes fresh fruits and vegetables, whole grains, lean protein, and low-fat dairy products. Limit your intake of foods with high amounts of  sugar, saturated fats, and salt.  Take vitamin and mineral supplements as recommended by your health care provider.  Do not drink alcohol if your health care provider tells you not to drink.  If you drink alcohol: ? Limit how much you have to 0-1 drink a day. ? Be aware of how much alcohol is in your drink. In the [place] one drink equals one 12 oz  bottle of beer (355 mL), one 5 oz glass of wine (148 mL), or one 1 oz glass of hard liquor (44 mL).   Lifestyle  Take daily care of your teeth and gums. Brush your teeth every morning and night with fluoride toothpaste. Floss one time each day.  Stay active. Exercise for at least 30 minutes 5 or more days each week.  Do not use any products that contain nicotine or tobacco, such as cigarettes, e-cigarettes, and chewing tobacco. If you need help quitting, ask your health care provider.  Do not use drugs.  If you are sexually active, practice safe sex. Use a condom or other form of protection in order to prevent STIs (sexually transmitted infections).  Talk with your health care provider about taking a low-dose aspirin or statin.  Find healthy ways to cope with stress, such as: ? Meditation, yoga, or listening to music. ? Journaling. ? Talking to a trusted person. ? Spending time with friends and family. Safety  Always wear your seat belt while driving or riding in a vehicle.  Do not drive: ? If you have been drinking alcohol. Do not ride with someone who has been drinking. ? When you are tired or distracted. ? While texting.  Wear a helmet and other protective equipment during sports activities.  If you have firearms in your house, make sure you follow all gun safety procedures. What's next?  Visit your health care provider once a year for an annual wellness visit.  Ask your health care provider how often you should have your eyes and teeth checked.  Stay up to date on all vaccines. This information is not intended to replace advice given to you by your health care provider. Make sure you discuss any questions you have with your health care provider. Document Revised: 07/07/2020 Document Reviewed: 07/11/2018 Elsevier Patient Education  [place].  [employer]'s Current Therapy 2021 (pp. 213-216). [place], [place]: Elsevier.">  Gastroesophageal Reflux Disease,  Adult Gastroesophageal reflux (GER) happens when acid from the stomach flows up into the tube that connects the mouth and the stomach (esophagus). Normally, food travels down the esophagus and stays in the stomach to be digested. However, when a person has GER, food and stomach acid sometimes move back up into the esophagus. If this becomes a more serious problem, the person may be diagnosed with a disease called gastroesophageal reflux disease (GERD). GERD occurs when the reflux:  Happens often.  Causes frequent or severe symptoms.  Causes problems such as damage to the esophagus. When stomach acid comes in contact with the esophagus, the acid may cause inflammation in the esophagus. Over time, GERD may create small holes (ulcers) in the lining of the esophagus. What are the causes? This condition is caused by a problem with the muscle between the esophagus and the stomach (lower esophageal sphincter, or LES). Normally, the LES muscle closes after food passes through the esophagus to the stomach. When the LES is weakened or abnormal, it does not close properly, and that allows food and stomach acid to go back up into the esophagus.  The LES can be weakened by certain dietary substances, medicines, and medical conditions, including:  Tobacco use.  Pregnancy.  Having a hiatal hernia.  Alcohol use.  Certain foods and beverages, such as coffee, chocolate, onions, and peppermint. What increases the risk? You are more likely to develop this condition if you:  Have an increased body weight.  Have a connective tissue disorder.  Take NSAIDs, such as ibuprofen. What are the signs or symptoms? Symptoms of this condition include:  Heartburn.  Difficult or painful swallowing and the feeling of having a lump in the throat.  A bitter taste in the mouth.  Bad breath and having a large amount of saliva.  Having an upset or bloated stomach and belching.  Chest pain. Different conditions can  cause chest pain. Make sure you see your health care provider if you experience chest pain.  Shortness of breath or wheezing.  Ongoing (chronic) cough or a nighttime cough.  Wearing away of tooth enamel.  Weight loss. How is this diagnosed? This condition may be diagnosed based on a medical history and a physical exam. To determine if you have mild or severe GERD, your health care provider may also monitor how you respond to treatment. You may also have tests, including:  A test to examine your stomach and esophagus with a small camera (endoscopy).  A test that measures the acidity level in your esophagus.  A test that measures how much pressure is on your esophagus.  A barium swallow or modified barium swallow test to show the shape, size, and functioning of your esophagus. How is this treated? Treatment for this condition may vary depending on how severe your symptoms are. Your health care provider may recommend:  Changes to your diet.  Medicine.  Surgery. The goal of treatment is to help relieve your symptoms and to prevent complications. Follow these instructions at home: Eating and drinking  Follow a diet as recommended by your health care provider. This may involve avoiding foods and drinks such as: ? Coffee and tea, with or without caffeine. ? Drinks that contain alcohol. ? Energy drinks and sports drinks. ? Carbonated drinks or sodas. ? Chocolate and cocoa. ? Peppermint and mint flavorings. ? Garlic and onions. ? Horseradish. ? Spicy and acidic foods, including peppers, chili powder, curry powder, vinegar, hot sauces, and barbecue sauce. ? Citrus fruit juices and citrus fruits, such as oranges, lemons, and limes. ? Tomato-based foods, such as red sauce, chili, salsa, and pizza with red sauce. ? Fried and fatty foods, such as donuts, french fries, potato chips, and high-fat dressings. ? High-fat meats, such as hot dogs and fatty cuts of red and white meats, such as  rib eye steak, sausage, ham, and bacon. ? High-fat dairy items, such as whole milk, butter, and cream cheese.  Eat small, frequent meals instead of large meals.  Avoid drinking large amounts of liquid with your meals.  Avoid eating meals during the 2-3 hours before bedtime.  Avoid lying down right after you eat.  Do not exercise right after you eat.   Lifestyle  Do not use any products that contain nicotine or tobacco. These products include cigarettes, chewing tobacco, and vaping devices, such as e-cigarettes. If you need help quitting, ask your health care provider.  Try to reduce your stress by using methods such as yoga or meditation. If you need help reducing stress, ask your health care provider.  If you are overweight, reduce your weight to an amount that is  healthy for you. Ask your health care provider for guidance about a safe weight loss goal.   General instructions  Pay attention to any changes in your symptoms.  Take over-the-counter and prescription medicines only as told by your health care provider. Do not take aspirin, ibuprofen, or other NSAIDs unless your health care provider told you to take these medicines.  Wear loose-fitting clothing. Do not wear anything tight around your waist that causes pressure on your abdomen.  Raise (elevate) the head of your bed about 6 inches (15 cm). You can use a wedge to do this.  Avoid bending over if this makes your symptoms worse.  Keep all follow-up visits. This is important. Contact a health care provider if:  You have: ? New symptoms. ? Unexplained weight loss. ? Difficulty swallowing or it hurts to swallow. ? Wheezing or a persistent cough. ? A hoarse voice.  Your symptoms do not improve with treatment. Get help right away if:  You have sudden pain in your arms, neck, jaw, teeth, or back.  You suddenly feel sweaty, dizzy, or light-headed.  You have chest pain or shortness of breath.  You vomit and the vomit  is green, yellow, or black, or it looks like blood or coffee grounds.  You faint.  You have stool that is red, bloody, or black.  You cannot swallow, drink, or eat. These symptoms may represent a serious problem that is an emergency. Do not wait to see if the symptoms will go away. Get medical help right away. Call your local emergency services (911 in the [place] Do not drive yourself to the hospital. Summary  Gastroesophageal reflux happens when acid from the stomach flows up into the esophagus. GERD is a disease in which the reflux happens often, causes frequent or severe symptoms, or causes problems such as damage to the esophagus.  Treatment for this condition may vary depending on how severe your symptoms are. Your health care provider may recommend diet and lifestyle changes, medicine, or surgery.  Contact a health care provider if you have new or worsening symptoms.  Take over-the-counter and prescription medicines only as told by your health care provider. Do not take aspirin, ibuprofen, or other NSAIDs unless your health care provider told you to do so.  Keep all follow-up visits as told by your health care provider. This is important. This information is not intended to replace advice given to you by your health care provider. Make sure you discuss any questions you have with your health care provider. Document Revised: 01/26/2020 Document Reviewed: 01/26/2020 Elsevier Patient Education  2021 [place] for Gastroesophageal Reflux Disease, Adult When you have gastroesophageal reflux disease (GERD), the foods you eat and your eating habits are very important. Choosing the right foods can help ease your discomfort. Think about working with a food expert (dietitian) to help you make good choices. What are tips for following this plan? Reading food labels  Look for foods that are low in saturated fat. Foods that may help with your symptoms include: ? Foods that  have less than 5% of daily value (DV) of fat. ? Foods that have 0 grams of trans fat. Cooking  Do not fry your food.  Cook your food by baking, steaming, grilling, or broiling. These are all methods that do not need a lot of fat for cooking.  To add flavor, try to use herbs that are low in spice and acidity. Meal planning  Choose healthy foods that  are low in fat, such as: ? Fruits and vegetables. ? Whole grains. ? Low-fat dairy products. ? Lean meats, fish, and poultry.  Eat small meals often instead of eating 3 large meals each day. Eat your meals slowly in a place where you are relaxed. Avoid bending over or lying down until 2-3 hours after eating.  Limit high-fat foods such as fatty meats or fried foods.  Limit your intake of fatty foods, such as oils, butter, and shortening.  Avoid the following as told by your doctor: ? Foods that cause symptoms. These may be different for different people. Keep a food diary to keep track of foods that cause symptoms. ? Alcohol. ? Drinking a lot of liquid with meals. ? Eating meals during the 2-3 hours before bed.   Lifestyle  Stay at a healthy weight. Ask your doctor what weight is healthy for you. If you need to lose weight, work with your doctor to do so safely.  Exercise for at least 30 minutes on 5 or more days each week, or as told by your doctor.  Wear loose-fitting clothes.  Do not smoke or use any products that contain nicotine or tobacco. If you need help quitting, ask your doctor.  Sleep with the head of your bed higher than your feet. Use a wedge under the mattress or blocks under the bed frame to raise the head of the bed.  Chew sugar-free gum after meals. What foods should eat? Eat a healthy, well-balanced diet of fruits, vegetables, whole grains, low-fat dairy products, lean meats, fish, and poultry. Each person is different. Foods that may cause symptoms in one person may not cause any symptoms in another person. Work  with your doctor to find foods that are safe for you. The items listed above may not be a complete list of what you can eat and drink. Contact a food expert for more options.   What foods should I avoid? Limiting some of these foods may help in managing the symptoms of GERD. Everyone is different. Talk with a food expert or your doctor to help you find the exact foods to avoid, if any. Fruits Any fruits prepared with added fat. Any fruits that cause symptoms. For some people, this may include citrus fruits, such as oranges, grapefruit, pineapple, and lemons. Vegetables Deep-fried vegetables. [place] fries. Any vegetables prepared with added fat. Any vegetables that cause symptoms. For some people, this may include tomatoes and tomato products, chili peppers, onions and garlic, and horseradish. Grains Pastries or quick breads with added fat. Meats and other proteins High-fat meats, such as fatty beef or pork, hot dogs, ribs, ham, sausage, salami, and bacon. Fried meat or protein, including fried fish and fried chicken. Nuts and nut butters, in large amounts. Dairy Whole milk and chocolate milk. Sour cream. Cream. Ice cream. Cream cheese. Milkshakes. Fats and oils Butter. Margarine. Shortening. Ghee. Beverages Coffee and tea, with or without caffeine. Carbonated beverages. Sodas. Energy drinks. Fruit juice made with acidic fruits, such as orange or grapefruit. Tomato juice. Alcoholic drinks. Sweets and desserts Chocolate and cocoa. Donuts. Seasonings and condiments Pepper. Peppermint and spearmint. Added salt. Any condiments, herbs, or seasonings that cause symptoms. For some people, this may include curry, hot sauce, or vinegar-based salad dressings. The items listed above may not be a complete list of what you should not eat and drink. Contact a food expert for more options. Questions to ask your doctor Diet and lifestyle changes are often the first steps that  are taken to manage symptoms of  GERD. If diet and lifestyle changes do not help, talk with your doctor about taking medicines. Where to find more information  International Foundation for Gastrointestinal Disorders: aboutgerd.org Summary  When you have GERD, food and lifestyle choices are very important in easing your symptoms.  Eat small meals often instead of 3 large meals a day. Eat your meals slowly and in a place where you are relaxed.  Avoid bending over or lying down until 2-3 hours after eating.  Limit high-fat foods such as fatty meats or fried foods. This information is not intended to replace advice given to you by your health care provider. Make sure you discuss any questions you have with your health care provider. Document Revised: 01/26/2020 Document Reviewed: 01/26/2020 Elsevier Patient Education  2021 [place].  Edema  Edema is an abnormal buildup of fluids in the body tissues and under the skin. Swelling of the legs, feet, and ankles is a common symptom that becomes more likely as you get older. Swelling is also common in looser tissues, like around the eyes. When the affected area is squeezed, the fluid may move out of that spot and leave a dent for a few moments. This dent is called pitting edema. There are many possible causes of edema. Eating too much salt (sodium) and being on your feet or sitting for a long time can cause edema in your legs, feet, and ankles. Hot weather may make edema worse. Common causes of edema include:  Heart failure.  Liver or kidney disease.  Weak leg blood vessels.  Cancer.  An injury.  Pregnancy.  Medicines.  Being obese.  Low protein levels in the blood. Edema is usually painless. Your skin may look swollen or shiny. Follow these instructions at home:  Keep the affected body part raised (elevated) above the level of your heart when you are sitting or lying down.  Do not sit still or stand for long periods of time.  Do not wear tight clothing. Do  not wear garters on your upper legs.  Exercise your legs to get your circulation going. This helps to move the fluid back into your blood vessels, and it may help the swelling go down.  Wear elastic bandages or support stockings to reduce swelling as told by your health care provider.  Eat a low-salt (low-sodium) diet to reduce fluid as told by your health care provider.  Depending on the cause of your swelling, you may need to limit how much fluid you drink (fluid restriction).  Take over-the-counter and prescription medicines only as told by your health care provider. Contact a health care provider if:  Your edema does not get better with treatment.  You have heart, liver, or kidney disease and have symptoms of edema.  You have sudden and unexplained weight gain. Get help right away if:  You develop shortness of breath or chest pain.  You cannot breathe when you lie down.  You develop pain, redness, or warmth in the swollen areas.  You have heart, liver, or kidney disease and suddenly get edema.  You have a fever and your symptoms suddenly get worse. Summary  Edema is an abnormal buildup of fluids in the body tissues and under the skin.  Eating too much salt (sodium) and being on your feet or sitting for a long time can cause edema in your legs, feet, and ankles.  Keep the affected body part raised (elevated) above the level of your heart  when you are sitting or lying down. This information is not intended to replace advice given to you by your health care provider. Make sure you discuss any questions you have with your health care provider. Document Revised: 12/04/2018 Document Reviewed: 08/19/2016 Elsevier Patient Education  2021 [employer].

## 2020-10-04 DIAGNOSIS — M170 Bilateral primary osteoarthritis of knee: Secondary | ICD-10-CM | POA: Diagnosis not present

## 2020-10-05 ENCOUNTER — Ambulatory Visit: Payer: Medicare Other

## 2020-10-05 ENCOUNTER — Other Ambulatory Visit: Payer: Self-pay

## 2020-10-05 DIAGNOSIS — Z0000 Encounter for general adult medical examination without abnormal findings: Secondary | ICD-10-CM

## 2020-10-05 DIAGNOSIS — Z1211 Encounter for screening for malignant neoplasm of colon: Secondary | ICD-10-CM

## 2020-10-05 NOTE — Patient Instructions (Signed)
Kelli Martinez , Thank you for taking time to come for your Medicare Wellness Visit. I appreciate your ongoing commitment to your health goals. Please review the following plan we discussed and let me know if I can assist you in the future.   Screening recommendations/referrals: Colonoscopy: Ordered place 10/05/20 Mammogram: Done 11/17/15 Bone Density: Done 12/24/19 Recommended yearly ophthalmology/optometry visit for glaucoma screening and checkup Recommended yearly dental visit for hygiene and checkup  Vaccinations: Influenza vaccine: Done 05/20/20 Pneumococcal vaccine: Up to date Tdap vaccine: Due and discussed Shingles vaccine: Shingrix discussed. Please contact your pharmacy for coverage information.    Covid-19:Completed 2/14, 3/9, & 06/25/20  Advanced directives: Advance directive discussed with you today. I have provided a copy for you to complete at home and have notarized. Once this is complete please bring a copy in to our office so we can scan it into your chart.  Conditions/risks identified: lose weight  Next appointment: Follow up in one year for your annual wellness visit    Preventive Care 73 Years and Older, Female Preventive care refers to lifestyle choices and visits with your health care provider that can promote health and wellness. What does preventive care include?  A yearly physical exam. This is also called an annual well check.  Dental exams once or twice a year.  Routine eye exams. Ask your health care provider how often you should have your eyes checked.  Personal lifestyle choices, including:  Daily care of your teeth and gums.  Regular physical activity.  Eating a healthy diet.  Avoiding tobacco and drug use.  Limiting alcohol use.  Practicing safe sex.  Taking low-dose aspirin every day.  Taking vitamin and mineral supplements as recommended by your health care provider. What happens during an annual well check? The services and screenings  done by your health care provider during your annual well check will depend on your age, overall health, lifestyle risk factors, and family history of disease. Counseling  Your health care provider may ask you questions about your:  Alcohol use.  Tobacco use.  Drug use.  Emotional well-being.  Home and relationship well-being.  Sexual activity.  Eating habits.  History of falls.  Memory and ability to understand (cognition).  Work and work Statistician.  Reproductive health. Screening  You may have the following tests or measurements:  Height, weight, and BMI.  Blood pressure.  Lipid and cholesterol levels. These may be checked every 5 years, or more frequently if you are over 73 years old.  Skin check.  Lung cancer screening. You may have this screening every year starting at age 73 if you have a 30-pack-year history of smoking and currently smoke or have quit within the past 15 years.  Fecal occult blood test (FOBT) of the stool. You may have this test every year starting at age 73.  Flexible sigmoidoscopy or colonoscopy. You may have a sigmoidoscopy every 5 years or a colonoscopy every 10 years starting at age 73  Hepatitis C blood test.  Hepatitis B blood test.  Sexually transmitted disease (STD) testing.  Diabetes screening. This is done by checking your blood sugar (glucose) after you have not eaten for a while (fasting). You may have this done every 1-3 years.  Bone density scan. This is done to screen for osteoporosis. You may have this done starting at age 73  Mammogram. This may be done every 1-2 years. Talk to your health care provider about how often you should have regular mammograms. Talk with  your health care provider about your test results, treatment options, and if necessary, the need for more tests. Vaccines  Your health care provider may recommend certain vaccines, such as:  Influenza vaccine. This is recommended every year.  Tetanus,  diphtheria, and acellular pertussis (Tdap, Td) vaccine. You may need a Td booster every 10 years.  Zoster vaccine. You may need this after age 73  Pneumococcal 13-valent conjugate (PCV13) vaccine. One dose is recommended after age 73  Pneumococcal polysaccharide (PPSV23) vaccine. One dose is recommended after age 73 Talk to your health care provider about which screenings and vaccines you need and how often you need them. This information is not intended to replace advice given to you by your health care provider. Make sure you discuss any questions you have with your health care provider. Document Released: 08/13/2015 Document Revised: 04/05/2016 Document Reviewed: 05/18/2015 Elsevier Interactive Patient Education  2017 [place] Prevention in the Home Falls can cause injuries. They can happen to people of all ages. There are many things you can do to make your home safe and to help prevent falls. What can I do on the outside of my home?  Regularly fix the edges of walkways and driveways and fix any cracks.  Remove anything that might make you trip as you walk through a door, such as a raised step or threshold.  Trim any bushes or trees on the path to your home.  Use bright outdoor lighting.  Clear any walking paths of anything that might make someone trip, such as rocks or tools.  Regularly check to see if handrails are loose or broken. Make sure that both sides of any steps have handrails.  Any raised decks and porches should have guardrails on the edges.  Have any leaves, snow, or ice cleared regularly.  Use sand or salt on walking paths during winter.  Clean up any spills in your garage right away. This includes oil or grease spills. What can I do in the bathroom?  Use night lights.  Install grab bars by the toilet and in the tub and shower. Do not use towel bars as grab bars.  Use non-skid mats or decals in the tub or shower.  If you need to sit down in  the shower, use a plastic, non-slip stool.  Keep the floor dry. Clean up any water that spills on the floor as soon as it happens.  Remove soap buildup in the tub or shower regularly.  Attach bath mats securely with double-sided non-slip rug tape.  Do not have throw rugs and other things on the floor that can make you trip. What can I do in the bedroom?  Use night lights.  Make sure that you have a light by your bed that is easy to reach.  Do not use any sheets or blankets that are too big for your bed. They should not hang down onto the floor.  Have a firm chair that has side arms. You can use this for support while you get dressed.  Do not have throw rugs and other things on the floor that can make you trip. What can I do in the kitchen?  Clean up any spills right away.  Avoid walking on wet floors.  Keep items that you use a lot in easy-to-reach places.  If you need to reach something above you, use a strong step stool that has a grab bar.  Keep electrical cords out of the way.  Do not  use floor polish or wax that makes floors slippery. If you must use wax, use non-skid floor wax.  Do not have throw rugs and other things on the floor that can make you trip. What can I do with my stairs?  Do not leave any items on the stairs.  Make sure that there are handrails on both sides of the stairs and use them. Fix handrails that are broken or loose. Make sure that handrails are as long as the stairways.  Check any carpeting to make sure that it is firmly attached to the stairs. Fix any carpet that is loose or worn.  Avoid having throw rugs at the top or bottom of the stairs. If you do have throw rugs, attach them to the floor with carpet tape.  Make sure that you have a light switch at the top of the stairs and the bottom of the stairs. If you do not have them, ask someone to add them for you. What else can I do to help prevent falls?  Wear shoes that:  Do not have high  heels.  Have rubber bottoms.  Are comfortable and fit you well.  Are closed at the toe. Do not wear sandals.  If you use a stepladder:  Make sure that it is fully opened. Do not climb a closed stepladder.  Make sure that both sides of the stepladder are locked into place.  Ask someone to hold it for you, if possible.  Clearly mark and make sure that you can see:  Any grab bars or handrails.  First and last steps.  Where the edge of each step is.  Use tools that help you move around (mobility aids) if they are needed. These include:  Canes.  Walkers.  Scooters.  Crutches.  Turn on the lights when you go into a dark area. Replace any light bulbs as soon as they burn out.  Set up your furniture so you have a clear path. Avoid moving your furniture around.  If any of your floors are uneven, fix them.  If there are any pets around you, be aware of where they are.  Review your medicines with your doctor. Some medicines can make you feel dizzy. This can increase your chance of falling. Ask your doctor what other things that you can do to help prevent falls. This information is not intended to replace advice given to you by your health care provider. Make sure you discuss any questions you have with your health care provider. Document Released: 05/13/2009 Document Revised: 12/23/2015 Document Reviewed: 08/21/2014 Elsevier Interactive Patient Education  2017 [employer].

## 2020-10-05 NOTE — Progress Notes (Signed)
Virtual Visit via Telephone Note  I connected with  Kelli Martinez on 10/05/20 at  8:00 AM EST by telephone and verified that I am speaking with the correct person using two identifiers.  Medicare Annual Wellness visit completed telephonically due to Covid-19 pandemic.   Persons participating in this call: This Health Coach and this patient.   Location: Patient: Home Provider: Office   I discussed the limitations, risks, security and privacy concerns of performing an evaluation and management service by telephone and the availability of in person appointments. The patient expressed understanding and agreed to proceed.  Unable to perform video visit due to video visit attempted and failed and/or patient does not have video capability.   Some vital signs may be absent or patient reported.   Willette Brace, LPN    Subjective:   Kelli Martinez is a 75 y.o. female who presents for Medicare Annual (Subsequent) preventive examination.  Review of Systems     Cardiac Risk Factors include: advanced age (>24mn, >>80women);diabetes mellitus;obesity (BMI >30kg/m2)     Objective:    There were no vitals filed for this visit. There is no height or weight on file to calculate BMI.  Advanced Directives 10/05/2020 12/25/2018 05/18/2018 11/24/2016 11/20/2016 01/19/2016 04/28/2015  Does Patient Have a Medical Advance Directive? _0  No No  Would patient like information on creating a medical advance directive? Yes (MAU/Ambulatory/Procedural Areas - Information given) - - No - Patient declined - Yes - Educational materials given No - patient declined information  Pre-existing out of facility DNR order (yellow form or pink MOST form) - - - - - - -    Current Medications (verified) Outpatient Encounter Medications as of 10/05/2020  Medication Sig  . acarbose (PRECOSE) 25 MG tablet Take 25 mg by mouth 3 (three) times daily with meals. Take 1 tablet by mouth at breakfast  and lunch, and 2 tablets at dinner.  .Marland Kitchenalbuterol (PROAIR HFA) 108 (90 Base) MCG/ACT inhaler Inhale 2 puffs into the lungs every 6 (six) hours as needed for wheezing or shortness of breath.  .Marland Kitchenazelastine (ASTELIN) 0.1 % nasal spray 1-2 puffs each nostril twice daily as needed  . baclofen (LIORESAL) 10 MG tablet Take 1 tablet (10 mg total) by mouth 3 (three) times daily.  . Blood Glucose Monitoring Suppl (FREESTYLE FREEDOM LITE) w/Device KIT Use as directed to check blood sugar once daily.  . Calcium Carbonate-Vitamin D 600-400 MG-UNIT tablet Take 1 tablet by mouth daily.  . clobetasol cream (TEMOVATE) 04.09% APPLY 1 APPLICATION TOPICALLY TWICE A DAY  . COVID-19 mRNA vaccine, Pfizer, 30 MCG/0.3ML injection   . diazepam (DIASTAT ACUDIAL) 10 MG GEL Place 5 mg rectally once as needed (cramping).  . ferrous sulfate 325 (65 FE) MG tablet Take 325 mg by mouth daily.  . fluticasone (FLONASE) 50 MCG/ACT nasal spray Place 2 sprays into both nostrils daily.  . Fluticasone-Salmeterol (ADVAIR DISKUS) 100-50 MCG/DOSE AEPB Inhale 1 puff into the lungs every 12 (twelve) hours. USE 1 INHALATION EVERY 12 HOURS, RINSE MOUTH  . FREESTYLE LITE test strip USE AS INSTRUCTED TO CHECK BLOOD SUGAR ONCE DAILY  . gabapentin (NEURONTIN) 300 MG capsule TAKE 2 CAPSULES THREE TIMES A DAY  . HYDROcodone-acetaminophen (NORCO) 5-325 MG tablet Take 1 tablet by mouth 2 (two) times daily as needed for moderate pain. (Patient taking differently: Take 0.5-1 tablets by mouth 3 (three) times daily as needed for moderate pain or severe pain.)  . Lancets (  FREESTYLE) lancets USE AS INSTRUCTED TO CHECK BLOOD SUGAR ONCE DAILY  . levothyroxine (SYNTHROID) 88 MCG tablet Take 1 tablet (88 mcg total) by mouth daily.  . Magnesium Cl-Calcium Carbonate (SLOW-MAG PO) Take 500 mg by mouth at bedtime.   . methylcellulose (ARTIFICIAL TEARS) 1 % ophthalmic solution Place 1 drop into both eyes as needed. Dry eyes  . omeprazole (PRILOSEC) 20 MG capsule Take  1 capsule (20 mg total) by mouth daily.  . pravastatin (PRAVACHOL) 40 MG tablet Take 1 tablet (40 mg total) by mouth daily.  Marland Kitchen spironolactone (ALDACTONE) 50 MG tablet Take 1 tablet (50 mg total) by mouth 2 (two) times daily. Take 2 tablets by mouth daily in the morning and 1 tablet in the evening.  . temazepam (RESTORIL) 15 MG capsule 1-2 for sleep as needed (Patient taking differently: Take 15-30 mg by mouth at bedtime as needed for sleep. 1-2 for sleep as needed)  . vitamin B-12 (CYANOCOBALAMIN) 500 MCG tablet Take 500 mcg by mouth daily.  . Vitamin D, Ergocalciferol, (DRISDOL) 1.25 MG (50000 UNIT) CAPS capsule TAKE 1 CAPSULE EVERY FRIDAY  . warfarin (COUMADIN) 5 MG tablet Take 2 tablets (10 mg total) by mouth daily. TAKE 2 TABLETS BY MOUTH ONCE DAILY, EXCEPT WEDNESDAYS. ON Wednesday, TAKE 1 TABLET DAILY. (Patient taking differently: Take 5-10 mg by mouth See admin instructions. Take 2 tablets ($RemoveBe'10mg'GryXNNwjG$ ) by mouth once daily, except wednesdays. On wednesdays, take 1 tablet ($RemoveB'5mg'hWrYWnnt$ ) by mouth daily.)  . furosemide (LASIX) 20 MG tablet Take 2 tablets (40 mg total) by mouth daily for 5 days.  . [DISCONTINUED] metFORMIN (GLUCOPHAGE) 500 MG tablet TAKE 2 TABLETS ONCE DAILY AT BEDTIME (Patient not taking: No sig reported)   No facility-administered encounter medications on file as of 10/05/2020.    Allergies (verified) Adhesive [tape]   History: Past Medical History:  Diagnosis Date  . Allergy   . Anemia   . Arthritis   . Asthma   . Back pain, chronic    GETS INJECTIONS IN BACK  . Blind right eye    hemorrhage  . Blood transfusion   . Cancer ([place])    breast 1994  . Clotting disorder ([place])   . Diabetes mellitus without complication ([place])    takes precose  . Elevated liver enzymes   . Gallstones   . GERD (gastroesophageal reflux disease)   . HX: breast cancer   . Hyperlipidemia   . Hypertension   . Hypothyroid   . Lymphedema of arm    RT  . Morbid obesity ([place])   . OSA (obstructive sleep  apnea)    has not used in 2 years-lost weight  . Osteoporosis   . Peripheral neuropathy    on gabapentin  . PONV (postoperative nausea and vomiting)   . Psoriasis   . Pulmonary embolism ([place]) 1998 / 1994 /1968  . Syncope and collapse 2011   due to anemia   Past Surgical History:  Procedure Laterality Date  . ABDOMINAL HYSTERECTOMY    . BONE MARROW TRANSPLANT  1994  . CARDIOVASCULAR STRESS TEST  05/23/2005   EF 53%  . carpal tunnell     bil  . cataracts    . CHOLECYSTECTOMY  05/10/2012   Procedure: LAPAROSCOPIC CHOLECYSTECTOMY WITH INTRAOPERATIVE CHOLANGIOGRAM;  Surgeon: Pedro Earls, MD;  Location: [facility];  Service: General;  Laterality: N/A;  Laparoscopic Cholecystectomy with Intraoperative Cholangiogram  . GASTRIC BYPASS  2011   bariatric surgery  . HIATAL HERNIA REPAIR    .  IVC filter     recurrent DVT  . KNEE ARTHROTOMY  1998  . MASS EXCISION Right 11/24/2016   Procedure: EXCISION RIGHT AXILLARY LESION AND CHEST WALL LESION;  Surgeon: Johnathan Hausen, MD;  Location: [facility];  Service: General;  Laterality: Right;  . MASTECTOMY     Right  . US ECHOCARDIOGRAPHY  10/27/08   EF 55-60%   Family History  Problem Relation Age of Onset  . Sudden death Mother        car accident  . Cancer Father 67       lung  . Cancer Sister 73       lung cancer  . Asthma Daughter    Social History   Socioeconomic History  . Marital status: Widowed    Spouse name: Not on file  . Number of children: 2  . Years of education: Not on file  . Highest education level: Not on file  Occupational History  . Occupation: retired  Tobacco Use  . Smoking status: Former Smoker    Packs/day: 0.50    Years: 10.00    Pack years: 5.00    Types: Cigarettes    Quit date: 07/31/1978    Years since quitting: 42.2  . Smokeless tobacco: Never Used  Vaping Use  . Vaping Use: Never used  Substance and Sexual Activity  . Alcohol use: No  . Drug use: No  . Sexual activity: Never   Other Topics Concern  . Not on file  Social History Narrative   Widowed with 2 children,  Lives in a one story home and her son and his wife recently moved in with her.  Retired Freight forwarder for [employer].  Education: some college.    Social Determinants of Health   Financial Resource Strain: Low Risk   . Difficulty of Paying Living Expenses: Not hard at all  Food Insecurity: No Food Insecurity  . Worried About Charity fundraiser in the Last Year: Never true  . Ran Out of Food in the Last Year: Never true  Transportation Needs: No Transportation Needs  . Lack of Transportation (Medical): No  . Lack of Transportation (Non-Medical): No  Physical Activity: Insufficiently Active  . Days of Exercise per Week: 7 days  . Minutes of Exercise per Session: 20 min  Stress: No Stress Concern Present  . Feeling of Stress : Not at all  Social Connections: Moderately Isolated  . Frequency of Communication with Friends and Family: More than three times a week  . Frequency of Social Gatherings with Friends and Family: Twice a week  . Attends Religious Services: More than 4 times per year  . Active Member of Clubs or Organizations: No  . Attends Archivist Meetings: Never  . Marital Status: Widowed    Tobacco Counseling Counseling given: Not Answered   Clinical Intake:  Pre-visit preparation completed: Yes  Pain : No/denies pain     BMI - recorded: 34.68 Nutritional Status: BMI > 30  Obese Nutritional Risks: None Diabetes: Yes CBG done?: No Did pt. bring in CBG monitor from home?: No  How often do you need to have someone help you when you read instructions, pamphlets, or other written materials from your doctor or pharmacy?: 1 - Never  Diabetic?Nutrition Risk Assessment:  Has the patient had any N/V/D within the last 2 months?  No  Does the patient have any non-healing wounds?  No  Has the patient had any unintentional weight loss or weight gain?  No   Diabetes:  Is the  patient diabetic?  Yes  If diabetic, was a CBG obtained today?  No  Did the patient bring in their glucometer from home?  No  How often do you monitor your CBG's? Couple times a week    Financial Strains and Diabetes Management:  Are you having any financial strains with the device, your supplies or your medication? No .  Does the patient want to be seen by Chronic Care Management for management of their diabetes?  No  Would the patient like to be referred to a Nutritionist or for Diabetic Management?  No   Diabetic Exams:  Diabetic Eye Exam: Overdue for diabetic eye exam. Pt has been advised about the importance in completing this exam. Patient advised to call and schedule an eye exam. Diabetic Foot Exam: Completed 09/24/19  Interpreter Needed?: No  Information entered by :: Charlott Rakes, LPN   Activities of Daily Living In your present state of health, do you have any difficulty performing the following activities: 10/05/2020  Hearing? N  Vision? N  Difficulty concentrating or making decisions? N  Walking or climbing stairs? Y  Comment reaated to cane and walker use  Dressing or bathing? N  Doing errands, shopping? N  Preparing Food and eating ? N  Using the Toilet? N  In the past six months, have you accidently leaked urine? N  Do you have problems with loss of bowel control? N  Managing your Medications? N  Managing your Finances? N  Housekeeping or managing your Housekeeping? N  Some recent data might be hidden    Patient Care Team: Billie Ruddy, MD as PCP - General (Family Medicine) Alda Berthold, DO as Consulting Physician (Neurology) Elayne Snare, MD as Consulting Physician (Endocrinology)  Indicate any recent Medical Services you may have received from other than [facility] providers in the past year (date may be approximate).     Assessment:   This is a routine wellness examination for [employer].  Hearing/Vision screen  Hearing Screening   _0  _1   _2  _3  _4  _5  _6  _7  _8   Right ear:           Left ear:           Comments: Pt denies any hearing issues   Vision Screening Comments: Pt follows Dr Mendel Corning and Dr Einar Gip  for annual eye exams   Dietary issues and exercise activities discussed: Current Exercise Habits: Home exercise routine, Type of exercise: Other - see comments (leg exercises), Time (Minutes): 25, Frequency (Times/Week): 7, Weekly Exercise (Minutes/Week): 175  Goals    . Patient Stated     Lose weight      Depression Screen PHQ 2/9 Scores 10/05/2020 09/27/2020 09/24/2019  PHQ - 2 Score 1 1 0  PHQ- 9 Score - 5 -    Fall Risk Fall Risk  10/05/2020 09/27/2020 12/25/2018 11/01/2017 07/27/2017  Falls in the past year? _9 No No  Comment - - - - -  Number falls in past yr: 1 0 1 - -  Injury with Fall? 1 0 1 - -  Risk for fall due to : Impaired vision;Impaired balance/gait;Impaired mobility - - - -  Follow up Falls prevention discussed - - - -    FALL RISK PREVENTION PERTAINING TO THE HOME:  Any stairs in or around the home? No  If so, are there any without handrails? No  Home free of loose throw rugs in walkways, pet  beds, electrical cords, etc? Yes  Adequate lighting in your home to reduce risk of falls? Yes   ASSISTIVE DEVICES UTILIZED TO PREVENT FALLS:  Life alert? No  Use of a cane, walker or w/c? Yes  Grab bars in the bathroom? No  Shower chair or bench in shower? Yes  Elevated toilet seat or a handicapped toilet? Yes   TIMED UP AND GO:  Was the test performed? No     Cognitive Function:     6CIT Screen 10/05/2020  What Year? 0 points  What month? 0 points  Count back from 20 0 points  Months in reverse 0 points  Repeat phrase 6 points    Immunizations Immunization History  Administered Date(s) Administered  . Fluad Quad(high Dose 65+) 03/20/2019, 05/20/2020  . Influenza Split 04/23/2012  . Influenza Whole 03/31/2009  . Influenza, High Dose Seasonal PF  04/07/2015, 04/21/2016, 04/27/2017, 04/23/2018  . Influenza,inj,Quad PF,6+ Mos 04/21/2014  . Influenza-Unspecified 05/09/2017  . PFIZER(Purple Top)SARS-COV-2 Vaccination 09/14/2019, 10/07/2019, 06/25/2020  . Pneumococcal Conjugate-13 07/06/2014  . Pneumococcal Polysaccharide-23 09/06/1998, 06/19/2019    TDAP status: Due, Education has been provided regarding the importance of this vaccine. Advised may receive this vaccine at local pharmacy or Health Dept. Aware to provide a copy of the vaccination record if obtained from local pharmacy or Health Dept. Verbalized acceptance and understanding.  Flu Vaccine status: Up to date  Pneumococcal vaccine status: Up to date  Covid-19 vaccine status: Completed vaccines  Qualifies for Shingles Vaccine? Yes   Zostavax completed No   Shingrix Completed?: No.    Education has been provided regarding the importance of this vaccine. Patient has been advised to call insurance company to determine out of pocket expense if they have not yet received this vaccine. Advised may also receive vaccine at local pharmacy or Health Dept. Verbalized acceptance and understanding.  Screening Tests Health Maintenance  Topic Date Due  . Hepatitis C Screening  Never done  . COLONOSCOPY (Pts 45-85yrs Insurance coverage will need to be confirmed)  07/31/2013  . MAMMOGRAM  11/16/2017  . TETANUS/TDAP  08/01/2019  . FOOT EXAM  09/23/2020  . URINE MICROALBUMIN  12/15/2020  . COVID-19 Vaccine (4 - Booster for Pfizer series) 12/23/2020  . HEMOGLOBIN A1C  03/27/2021  . OPHTHALMOLOGY EXAM  08/04/2021  . INFLUENZA VACCINE  Completed  . DEXA SCAN  Completed  . PNA vac Low Risk Adult  Completed  . HPV VACCINES  Aged Out    Health Maintenance  Health Maintenance Due  Topic Date Due  . Hepatitis C Screening  Never done  . COLONOSCOPY (Pts 45-82yrs Insurance coverage will need to be confirmed)  07/31/2013  . MAMMOGRAM  11/16/2017  . TETANUS/TDAP  08/01/2019  . FOOT EXAM   09/23/2020    Colorectal cancer screening: Referral to GI placed 10/05/20. Pt aware the office will call re: appt.  Mammogram status: Completed 11/17/15. Repeat every year  Bone Density status: Completed 12/24/19. Results reflect: Bone density results: OSTEOPENIA. Repeat every 2 years.  Additional Screening:  Hepatitis C Screening: does qualify  Vision Screening: Recommended annual ophthalmology exams for early detection of glaucoma and other disorders of the eye. Is the patient up to date with their annual eye exam?  Yes  Who is the provider or what is the name of the office in which the patient attends annual eye exams? Dr Ashley Royalty and Dr Harriette Bouillon  If pt is not established with a provider, would they like to be referred to a  provider to establish care? No .   Dental Screening: Recommended annual dental exams for proper oral hygiene  Community Resource Referral / Chronic Care Management: CRR required this visit?  No   CCM required this visit?  No      Plan:     I have personally reviewed and noted the following in the patient's chart:   . Medical and social history . Use of alcohol, tobacco or illicit drugs  . Current medications and supplements . Functional ability and status . Nutritional status . Physical activity . Advanced directives . List of other physicians . Hospitalizations, surgeries, and ER visits in previous 12 months . Vitals . Screenings to include cognitive, depression, and falls . Referrals and appointments  In addition, I have reviewed and discussed with patient certain preventive protocols, quality metrics, and best practice recommendations. A written personalized care plan for preventive services as well as general preventive health recommendations were provided to patient.     Willette Brace, LPN   09/04/971   Nurse Notes: None

## 2020-10-07 ENCOUNTER — Ambulatory Visit (INDEPENDENT_AMBULATORY_CARE_PROVIDER_SITE_OTHER): Payer: Medicare Other | Admitting: General Practice

## 2020-10-07 ENCOUNTER — Other Ambulatory Visit: Payer: Self-pay

## 2020-10-07 DIAGNOSIS — Z7901 Long term (current) use of anticoagulants: Secondary | ICD-10-CM | POA: Diagnosis not present

## 2020-10-07 LAB — POCT INR: INR: 2.8 (ref 2.0–3.0)

## 2020-10-07 NOTE — Progress Notes (Signed)
Medical treatment/procedure(s) were performed by non-physician practitioner and as supervising physician I was immediately available for consultation/collaboration.  I agree with above. Hoyt Koch, MD

## 2020-10-07 NOTE — Patient Instructions (Addendum)
Pre visit review using our clinic review tool, if applicable. No additional management support is needed unless otherwise documented below in the visit note.  Continue to take to take 2 tablets daily except 1 tablet on Wednesdays.  Re-check in 4 weeks.  

## 2020-10-12 ENCOUNTER — Other Ambulatory Visit: Payer: Self-pay

## 2020-10-12 ENCOUNTER — Ambulatory Visit
Admission: RE | Admit: 2020-10-12 | Discharge: 2020-10-12 | Disposition: A | Discharge status: Home / Self Care | Payer: Medicare Other | Source: Ambulatory Visit | Attending: Surgery | Admitting: Surgery

## 2020-10-12 DIAGNOSIS — Z0000 Encounter for general adult medical examination without abnormal findings: Secondary | ICD-10-CM

## 2020-10-12 DIAGNOSIS — Z1231 Encounter for screening mammogram for malignant neoplasm of breast: Secondary | ICD-10-CM | POA: Diagnosis not present

## 2020-10-21 ENCOUNTER — Telehealth: Payer: Self-pay | Admitting: Family Medicine

## 2020-10-21 NOTE — Telephone Encounter (Signed)
Patient is calling and is requesting a refill for Lancets (FREESTYLE) lancets to go to [facility] [address]. [address] [place] [address]  Phone:  [phone] Fax:  [phone]   CB is [phone]

## 2020-10-22 ENCOUNTER — Other Ambulatory Visit: Payer: Self-pay | Admitting: Family Medicine

## 2020-10-22 DIAGNOSIS — I872 Venous insufficiency (chronic) (peripheral): Secondary | ICD-10-CM

## 2020-10-22 MED ORDER — FUROSEMIDE 20 MG PO TABS: 40.0000 mg | ORAL_TABLET | Freq: Every day | ORAL | 0 refills | Status: DC

## 2020-10-22 MED ORDER — FREESTYLE LANCETS MISC: 1 refills | Status: DC

## 2020-10-22 NOTE — Telephone Encounter (Signed)
This message was resolved

## 2020-10-22 NOTE — Telephone Encounter (Signed)
Refill sent to [facility].

## 2020-11-03 ENCOUNTER — Other Ambulatory Visit: Payer: Self-pay | Admitting: Family Medicine

## 2020-11-03 DIAGNOSIS — I872 Venous insufficiency (chronic) (peripheral): Secondary | ICD-10-CM

## 2020-11-03 NOTE — Telephone Encounter (Signed)
Patient is calling and requesting a refill for furosemide (LASIX) 20 MG tablet to be sent to  [facility] [address], [place], [place] [address]  Phone:  [phone] Fax:  [phone] CB is [phone]

## 2020-11-03 NOTE — Telephone Encounter (Signed)
Patient is requesting refill, med list shows it is expired as of 10/22/20. Rx was sent in for 5 days on 10/22/20. Okay to refill?

## 2020-11-04 ENCOUNTER — Ambulatory Visit (INDEPENDENT_AMBULATORY_CARE_PROVIDER_SITE_OTHER): Payer: Medicare Other | Admitting: General Practice

## 2020-11-04 ENCOUNTER — Other Ambulatory Visit: Payer: Self-pay

## 2020-11-04 DIAGNOSIS — Z7901 Long term (current) use of anticoagulants: Secondary | ICD-10-CM | POA: Diagnosis not present

## 2020-11-04 LAB — POCT INR: INR: 2.3 (ref 2.0–3.0)

## 2020-11-04 NOTE — Progress Notes (Signed)
Medical treatment/procedure(s) were performed by non-physician practitioner and as supervising physician I was immediately available for consultation/collaboration. I agree with above. Amillya A Tykeshia Tourangeau, MD  

## 2020-11-04 NOTE — Patient Instructions (Signed)
Pre visit review using our clinic review tool, if applicable. No additional management support is needed unless otherwise documented below in the visit note.  Continue to take to take 2 tablets daily except 1 tablet on Wednesdays.  Re-check in 4 weeks.

## 2020-11-05 ENCOUNTER — Other Ambulatory Visit: Payer: Self-pay | Admitting: Family Medicine

## 2020-11-05 ENCOUNTER — Telehealth: Payer: Self-pay | Admitting: Family Medicine

## 2020-11-05 ENCOUNTER — Other Ambulatory Visit: Payer: Self-pay

## 2020-11-05 DIAGNOSIS — Z7901 Long term (current) use of anticoagulants: Secondary | ICD-10-CM

## 2020-11-05 MED ORDER — FUROSEMIDE 20 MG PO TABS: 40.0000 mg | ORAL_TABLET | Freq: Every day | ORAL | 0 refills | Status: DC

## 2020-11-05 NOTE — Telephone Encounter (Signed)
Patient called in requesting refill on Warfarin.  Last refill was 06/24/2019

## 2020-11-05 NOTE — Telephone Encounter (Signed)
error 

## 2020-11-05 NOTE — Telephone Encounter (Signed)
Patient is calling back regarding medication. Pt is also requesting a prescription for Warfarin to be [facility], please advise. CB is [phone]

## 2020-11-08 MED ORDER — WARFARIN SODIUM 5 MG PO TABS: ORAL_TABLET | ORAL | 3 refills | Status: DC

## 2020-11-11 ENCOUNTER — Ambulatory Visit (INDEPENDENT_AMBULATORY_CARE_PROVIDER_SITE_OTHER): Payer: Medicare Other | Admitting: Family Medicine

## 2020-11-11 ENCOUNTER — Other Ambulatory Visit: Payer: Self-pay

## 2020-11-11 ENCOUNTER — Encounter: Payer: Self-pay | Admitting: Family Medicine

## 2020-11-11 VITALS — BP 130/70 | HR 69 | Temp 97.8°F | Wt 215.8 lb

## 2020-11-11 DIAGNOSIS — M791 Myalgia, unspecified site: Secondary | ICD-10-CM

## 2020-11-11 DIAGNOSIS — I872 Venous insufficiency (chronic) (peripheral): Secondary | ICD-10-CM

## 2020-11-11 MED ORDER — FUROSEMIDE 20 MG PO TABS: 20.0000 mg | ORAL_TABLET | Freq: Every day | ORAL | 0 refills | Status: DC

## 2020-11-11 MED ORDER — POTASSIUM CHLORIDE CRYS ER 20 MEQ PO TBCR: 20.0000 meq | EXTENDED_RELEASE_TABLET | Freq: Every day | ORAL | 11 refills | Status: DC

## 2020-11-11 MED ORDER — POTASSIUM CHLORIDE CRYS ER 20 MEQ PO TBCR: 20.0000 meq | EXTENDED_RELEASE_TABLET | Freq: Every day | ORAL | 0 refills | Status: DC

## 2020-11-11 MED ORDER — FUROSEMIDE 20 MG PO TABS: 20.0000 mg | ORAL_TABLET | Freq: Every day | ORAL | 11 refills | Status: DC

## 2020-11-11 NOTE — Progress Notes (Signed)
Subjective:    Patient ID: Kelli Martinez, female    DOB: 1945-12-01, 75 y.o.   MRN: [id number]  No chief complaint on file.   HPI Patient was seen today for f/u.  Pt notes improvement in LE edema after taking lasix 40 mg x 5 days. Was able to wear different shoes to church.  Pt continues to elevated LEs when sitting.  Wears thigh high TED hose when able.  Less edema when moving around throughout the day.  Less myalgias since holding pravastatin 40 mg.  Having L hip cramping and spasms.  Taking Baclofen 10 mg BID and 20 mg qhs.  Will take an extra 10 mg dose at night prn per chart review.  Also using Slow mag, mustard, diet tonic water, and pickle juice for cramps.  Has f/u with Dr. Nelva Bush next month.  Past Medical History:  Diagnosis Date  . Allergy   . Anemia   . Arthritis   . Asthma   . Back pain, chronic    GETS INJECTIONS IN BACK  . Blind right eye    hemorrhage  . Blood transfusion   . Cancer ([place])    breast 1994  . Clotting disorder ([place])   . Diabetes mellitus without complication ([place])    takes precose  . Elevated liver enzymes   . Gallstones   . GERD (gastroesophageal reflux disease)   . HX: breast cancer   . Hyperlipidemia   . Hypertension   . Hypothyroid   . Lymphedema of arm    RT  . Morbid obesity ([place])   . OSA (obstructive sleep apnea)    has not used in 2 years-lost weight  . Osteoporosis   . Peripheral neuropathy    on gabapentin  . PONV (postoperative nausea and vomiting)   . Psoriasis   . Pulmonary embolism ([place]) 1998 / 1994 /1968  . Syncope and collapse 2011   due to anemia    Allergies  Allergen Reactions  . Adhesive [Tape] Rash    ROS General: Denies fever, chills, night sweats, changes in weight, changes in appetite HEENT: Denies headaches, ear pain, changes in vision, rhinorrhea, sore throat CV: Denies CP, palpitations, SOB, orthopnea  +LE edema Pulm: Denies SOB, cough, wheezing GI: Denies abdominal pain, nausea, vomiting,  diarrhea, constipation GU: Denies dysuria, hematuria, frequency, vaginal discharge Msk: Denies joint pains  +muscle spasms Neuro: Denies weakness, numbness, tingling Skin: Denies rashes, bruising Psych: Denies depression, anxiety, hallucinations    Objective:    Blood pressure 130/70, pulse 69, temperature 97.8 F (36.6 C), temperature source Oral, weight 215 lb 12.8 oz (97.9 kg), SpO2 99 %.  Gen. Pleasant, well-nourished, in no distress, normal affect   HEENT: [place]/AT, face symmetric, conjunctiva clear, no scleral icterus, PERRLA, EOMI, nares patent without drainage Lungs: no accessory muscle use, CTAB, no wheezes or rales Cardiovascular: RRR, no m/r/g, 2+ pedal edema, 1 + at shins, trace edema at knee.   Musculoskeletal: RUE with lymphedema.  LLE>RLE.  TTP of b/l LEs.  No deformities, no cyanosis or clubbing, normal tone Neuro:  A&Ox3, CN II-XII intact, normal gait Skin:  Warm, no lesions/ rash. L knee with well healed surgical incision midline.  Chronic lymphedema skin changes.  Wt Readings from Last 3 Encounters:  11/11/20 215 lb 12.8 oz (97.9 kg)  09/27/20 221 lb 6.4 oz (100.4 kg)  09/23/20 215 lb (97.5 kg)    Lab Results  Component Value Date   WBC 3.8 (L) 09/27/2020   HGB 12.2  09/27/2020   HCT 37.3 09/27/2020   PLT 161.0 09/27/2020   GLUCOSE 91 09/27/2020   CHOL 183 07/19/2020   TRIG 49.0 07/19/2020   HDL 93.50 07/19/2020   LDLCALC 80 07/19/2020   ALT 26 09/27/2020   AST 24 09/27/2020   NA 139 09/27/2020   K 3.9 09/27/2020   CL 103 09/27/2020   CREATININE 0.82 09/27/2020   BUN 13 09/27/2020   CO2 29 09/27/2020   TSH 1.31 09/27/2020   INR 2.3 11/04/2020   HGBA1C 6.5 09/27/2020   MICROALBUR <0.7 12/16/2019    Assessment/Plan:  Edema of both lower extremities due to peripheral venous insufficiency -increased -continue supportive care: limiting sodium intake, elevation on extremities, TED hose, etc. -Per chart review pt with muscle cramps with dose of lasix  >20 mg daily. -continue spironolactone -monitor K+ level  - Plan: furosemide (LASIX) 20 MG tablet, potassium chloride SA (KLOR-CON) 20 MEQ tablet  Myalgia -continue to hold pravastatin 40 mg and monitor for improvement.  Restart at next [facility] if pt still unsure of improvement -lifestyle modification -continue f/u with Dr. Nelva Bush  F/u in 1 month  Grier Mitts, MD

## 2020-11-11 NOTE — Patient Instructions (Addendum)
A prescription for lasix 20 mg daily and kdur 20 mEq daily (potassium supplement) was sent to your mail order pharmacy Chronic Venous Insufficiency Chronic venous insufficiency is a condition where the leg veins cannot effectively pump blood from the legs to the heart. This happens when the vein walls are either stretched, weakened, or damaged, or when the valves inside the vein are damaged. With the right treatment, you should be able to continue with an active life. This condition is also called venous stasis. What are the causes? Common causes of this condition include:  High blood pressure inside the veins (venous hypertension).  Sitting or standing too long, causing increased blood pressure in the leg veins.  A blood clot that blocks blood flow in a vein (deep vein thrombosis, DVT).  Inflammation of a vein (phlebitis) that causes a blood clot to form.  Tumors in the pelvis that cause blood to back up. What increases the risk? The following factors may make you more likely to develop this condition:  Having a family history of this condition.  Obesity.  Pregnancy.  Living without enough regular physical activity or exercise (sedentary lifestyle).  Smoking.  Having a job that requires long periods of standing or sitting in one place.  Being a certain age. Women in their 54s and 76s and men in their 65s are more likely to develop this condition. What are the signs or symptoms? Symptoms of this condition include:  Veins that are enlarged, bulging, or twisted (varicose veins).  Skin breakdown or ulcers.  Reddened skin or dark discoloration of skin on the leg between the knee and ankle.  Brown, smooth, tight, and painful skin just above the ankle, usually on the inside of the leg (lipodermatosclerosis).  Swelling of the legs. How is this diagnosed? This condition may be diagnosed based on:  Your medical history.  A physical exam.  Tests, such as: ? A procedure that  creates an image of a blood vessel and nearby organs and provides information about blood flow through the blood vessel (duplex ultrasound). ? A procedure that tests blood flow (plethysmography). ? A procedure that looks at the veins using X-ray and dye (venogram). How is this treated? The goals of treatment are to help you return to an active life and to minimize pain or disability. Treatment depends on the severity of your condition, and it may include:  Wearing compression stockings. These can help relieve symptoms and help prevent your condition from getting worse. However, they do not cure the condition.  Sclerotherapy. This procedure involves an injection of a solution that shrinks damaged veins.  Surgery. This may involve: ? Removing a diseased vein (vein stripping). ? Cutting off blood flow through the vein (laser ablation surgery). ? Repairing or reconstructing a valve within the affected vein.   Follow these instructions at home:  Wear compression stockings as told by your health care provider. These stockings help to prevent blood clots and reduce swelling in your legs.  Take over-the-counter and prescription medicines only as told by your health care provider.  Stay active by exercising, walking, or doing different activities. Ask your health care provider what activities are safe for you and how much exercise you need.  Drink enough fluid to keep your urine pale yellow.  Do not use any products that contain nicotine or tobacco, such as cigarettes, e-cigarettes, and chewing tobacco. If you need help quitting, ask your health care provider.  Keep all follow-up visits as told by your health  care provider. This is important.      Contact a health care provider if you:  Have redness, swelling, or more pain in the affected area.  See a red streak or line that goes up or down from the affected area.  Have skin breakdown or skin loss in the affected area, even if the breakdown  is small.  Get an injury in the affected area. Get help right away if:  You get an injury and an open wound in the affected area.  You have: ? Severe pain that does not get better with medicine. ? Sudden numbness or weakness in the foot or ankle below the affected area. ? Trouble moving your foot or ankle. ? A fever. ? Worse or persistent symptoms. ? Chest pain. ? Shortness of breath. Summary  Chronic venous insufficiency is a condition where the leg veins cannot effectively pump blood from the legs to the heart.  Chronic venous insufficiency occurs when the vein walls become stretched, weakened, or damaged, or when valves within the vein are damaged.  Treatment depends on how severe your condition is. It often involves wearing compression stockings and may involve having a procedure.  Make sure you stay active by exercising, walking, or doing different activities. Ask your health care provider what activities are safe for you and how much exercise you need. This information is not intended to replace advice given to you by your health care provider. Make sure you discuss any questions you have with your health care provider. Document Revised: 04/09/2018 Document Reviewed: 04/09/2018 Elsevier Patient Education  2021 [employer].

## 2020-11-23 ENCOUNTER — Other Ambulatory Visit: Payer: Self-pay

## 2020-11-23 ENCOUNTER — Other Ambulatory Visit (INDEPENDENT_AMBULATORY_CARE_PROVIDER_SITE_OTHER): Payer: Medicare Other

## 2020-11-23 DIAGNOSIS — E1142 Type 2 diabetes mellitus with diabetic polyneuropathy: Secondary | ICD-10-CM

## 2020-11-23 DIAGNOSIS — E039 Hypothyroidism, unspecified: Secondary | ICD-10-CM

## 2020-11-23 LAB — BASIC METABOLIC PANEL
BUN: 21 mg/dL (ref 6–23)
CO2: 30 mEq/L (ref 19–32)
Calcium: 9.7 mg/dL (ref 8.4–10.5)
Chloride: 101 mEq/L (ref 96–112)
Creatinine, Ser: 0.96 mg/dL (ref 0.40–1.20)
GFR: 58.92 mL/min — ABNORMAL LOW (ref >60.00)
Glucose, Bld: 113 mg/dL — ABNORMAL HIGH (ref 70–99)
Potassium: 4.3 mEq/L (ref 3.5–5.1)
Sodium: 136 mEq/L (ref 135–145)

## 2020-11-23 LAB — HEMOGLOBIN A1C: Hgb A1c MFr Bld: 6.9 % — ABNORMAL HIGH (ref 4.6–6.5)

## 2020-11-23 LAB — TSH: TSH: 8.65 u[IU]/mL — ABNORMAL HIGH (ref 0.35–4.50)

## 2020-11-25 ENCOUNTER — Other Ambulatory Visit: Payer: Self-pay | Admitting: *Deleted

## 2020-11-25 ENCOUNTER — Ambulatory Visit (INDEPENDENT_AMBULATORY_CARE_PROVIDER_SITE_OTHER): Payer: Medicare Other | Admitting: Endocrinology

## 2020-11-25 ENCOUNTER — Other Ambulatory Visit: Payer: Self-pay

## 2020-11-25 ENCOUNTER — Encounter: Payer: Self-pay | Admitting: Endocrinology

## 2020-11-25 VITALS — BP 134/76 | HR 62 | Ht 67.0 in | Wt 220.4 lb

## 2020-11-25 DIAGNOSIS — R252 Cramp and spasm: Secondary | ICD-10-CM | POA: Diagnosis not present

## 2020-11-25 DIAGNOSIS — K912 Postsurgical malabsorption, not elsewhere classified: Secondary | ICD-10-CM

## 2020-11-25 DIAGNOSIS — E1142 Type 2 diabetes mellitus with diabetic polyneuropathy: Secondary | ICD-10-CM

## 2020-11-25 DIAGNOSIS — E039 Hypothyroidism, unspecified: Secondary | ICD-10-CM

## 2020-11-25 MED ORDER — LEVOTHYROXINE SODIUM 100 MCG PO TABS: 100.0000 ug | ORAL_TABLET | Freq: Every day | ORAL | 1 refills | Status: DC

## 2020-11-25 MED ORDER — BACLOFEN 20 MG PO TABS: 20.0000 mg | ORAL_TABLET | Freq: Three times a day (TID) | ORAL | 0 refills | Status: DC

## 2020-11-25 MED ORDER — GABAPENTIN 600 MG PO TABS: 600.0000 mg | ORAL_TABLET | Freq: Three times a day (TID) | ORAL | 0 refills | Status: DC

## 2020-11-25 NOTE — Patient Instructions (Addendum)
Add a protein at Bfst daily  Precose 2-1--1 with meals  Synthroid 88, take 2 on Fridays

## 2020-11-25 NOTE — Progress Notes (Signed)
Patient ID: Kelli Martinez, female   DOB: [birth year], 75 y.o.   MRN: [id number]    Chief complaint: Followup of various chronic problems    History of Present Illness:  She is here for follow-up:  1.  Postprandial hypoglycemia related to dumping syndrome   She has had postprandial hypoglycemia starting a couple of years after her gastric bypass surgery Her symptoms of blood sugars are mostly a feeling of significant weakness, some shakiness.  She will use glucose tablets or juice in order to relieve the symptoms, recently she thinks that 1 or 2 glucose tablets are usually adequate She has been to the dietitian and has been instructed on balanced low-fat meals with enough protein consistently and restricting carbohydrates including fruits   Recent history:  She has been treated with acarbose 25 mg before meals  She did try verapamil previously benefit  She is taking her Precose before meals, taking 1 before breakfast, 1 tablet at lunch and 2 at dinnertime   See again has episodes of low blood sugar with symptoms of shakiness at times. This may be during the night also but does not always document the low sugars with fingersticks She was told to take 2 tablets of records at lunchtime but she is only taking 1 She now says that she may get more low sugar episodes that are low before lunchtime However at times will only eat oatmeal without any protein along with fruit Blood sugar after eating yogurt in the morning was 113 in the lab For the last 2 weeks she has had only 1 low blood sugar of 62 around 1 PM  Metformin was stopped previously   2.  SWELLING of the legs:  She has had significant problems with her legs and feet chronically She has been followed by PCP  Swelling increases at the end of the day  Now taking Aldactone 50 mg twice daily  Taking 20 mg Lasix daily; gets cramping with higher doses  As before she does not like to wear compression  stockings because of discomfort and difficulty putting them on She thinks her swelling is somewhat worse recently  Renal function and potassium are consistently normal  Lab Results  Component Value Date   K 4.3 11/23/2020   Lab Results  Component Value Date   CREATININE 0.96 11/23/2020   CREATININE 0.82 09/27/2020   CREATININE 0.91 07/19/2020     PROBLEM 3:  DIABETES:  This has been mild and well controlled since her gastric bypass surgery  The A1c usually higher than expected, now 6.9  Current management, blood sugar patterns:  She has lost weight  Her total calorie intake is fairly good although may get protein consistently with breakfast  Blood sugar range at home is between 60-1 23 with average 90, checking only sporadically at different times and not clear if the readings are after meals  Fasting reading couple of days ago was 83 and previously 98  She does try to do some home exercises  She was told to stop her Metformin  because of tendency to hypoglycemia symptoms Still taking Precose at each meal also to prevent reactive hypoglycemia as discussed above  Glucose readings from monitor download: As above  PREVIOUS data  Values 96, 100 in the morning POSTPRANDIAL 100-166 with only 2 readings over 140 AVERAGE 111    Wt Readings from Last 3 Encounters:  11/25/20 220 lb 6.4 oz (100 kg)  11/11/20 215 lb 12.8 oz (97.9 kg)  09/27/20 221 lb 6.4 oz (100.4 kg)    Lab Results  Component Value Date   HGBA1C 6.9 (H) 11/23/2020   HGBA1C 6.5 09/27/2020   HGBA1C 6.5 07/19/2020   Lab Results  Component Value Date   MICROALBUR <0.7 12/16/2019   [place] 80 07/19/2020   CREATININE 0.96 11/23/2020      Other active problems: see review of systems     Allergies as of 11/25/2020      Reactions   Adhesive [tape] Rash      Medication List       Accurate as of November 25, 2020  9:31 AM. If you have any questions, ask your nurse or doctor.        acarbose  25 MG tablet Commonly known as: PRECOSE Take 25 mg by mouth 3 (three) times daily with meals. Take 1 tablet by mouth at breakfast and lunch, and 2 tablets at dinner.   albuterol 108 (90 Base) MCG/ACT inhaler Commonly known as: ProAir HFA Inhale 2 puffs into the lungs every 6 (six) hours as needed for wheezing or shortness of breath.   azelastine 0.1 % nasal spray Commonly known as: ASTELIN 1-2 puffs each nostril twice daily as needed   baclofen 10 MG tablet Commonly known as: LIORESAL Take 1 tablet (10 mg total) by mouth 3 (three) times daily.   Calcium Carbonate-Vitamin D 600-400 MG-UNIT tablet Take 1 tablet by mouth daily.   clobetasol cream 0.05 % Commonly known as: TEMOVATE APPLY 1 APPLICATION TOPICALLY TWICE A DAY   COVID-19 mRNA vaccine (Pfizer) 30 MCG/0.3ML injection   diazepam 10 MG Gel Commonly known as: DIASTAT ACUDIAL Place 5 mg rectally once as needed (cramping).   ferrous sulfate 325 (65 FE) MG tablet Take 325 mg by mouth daily.   fluticasone 50 MCG/ACT nasal spray Commonly known as: FLONASE Place 2 sprays into both nostrils daily.   Fluticasone-Salmeterol 100-50 MCG/DOSE Aepb Commonly known as: Advair Diskus Inhale 1 puff into the lungs every 12 (twelve) hours. USE 1 INHALATION EVERY 12 HOURS, RINSE MOUTH   FreeStyle Freedom Lite w/Device Kit Use as directed to check blood sugar once daily.   freestyle lancets USE AS INSTRUCTED TO CHECK BLOOD SUGAR ONCE DAILY   FREESTYLE LITE test strip Generic drug: glucose blood USE AS INSTRUCTED TO CHECK BLOOD SUGAR ONCE DAILY   furosemide 20 MG tablet Commonly known as: LASIX Take 1 tablet (20 mg total) by mouth daily.   gabapentin 300 MG capsule Commonly known as: NEURONTIN TAKE 2 CAPSULES THREE TIMES A DAY   HYDROcodone-acetaminophen 5-325 MG tablet Commonly known as: Norco Take 1 tablet by mouth 2 (two) times daily as needed for moderate pain. What changed:   how much to take  when to take  this  reasons to take this   levothyroxine 88 MCG tablet Commonly known as: SYNTHROID Take 1 tablet (88 mcg total) by mouth daily.   methylcellulose 1 % ophthalmic solution Commonly known as: ARTIFICIAL TEARS Place 1 drop into both eyes as needed. Dry eyes   omeprazole 20 MG capsule Commonly known as: PRILOSEC Take 1 capsule (20 mg total) by mouth daily.   potassium chloride SA 20 MEQ tablet Commonly known as: KLOR-CON Take 1 tablet (20 mEq total) by mouth daily.   pravastatin 40 MG tablet Commonly known as: PRAVACHOL Take 1 tablet (40 mg total) by mouth daily.   SLOW-MAG PO Take 500 mg by mouth at bedtime.   spironolactone 50 MG tablet Commonly known as: ALDACTONE  Take 1 tablet (50 mg total) by mouth 2 (two) times daily. Take 2 tablets by mouth daily in the morning and 1 tablet in the evening.   temazepam 15 MG capsule Commonly known as: RESTORIL 1-2 for sleep as needed What changed:   how much to take  how to take this  when to take this  reasons to take this   vitamin B-12 500 MCG tablet Commonly known as: CYANOCOBALAMIN Take 500 mcg by mouth daily.   Vitamin D (Ergocalciferol) 1.25 MG (50000 UNIT) Caps capsule Commonly known as: DRISDOL TAKE 1 CAPSULE EVERY FRIDAY   warfarin 5 MG tablet Commonly known as: COUMADIN Take as directed by the anticoagulation clinic. If you are unsure how to take this medication, talk to your nurse or doctor. Original instructions: Please take 2 tablets daily except take 1 tablet on Wed or Take as directed by anticoagulation clinic       Allergies:  Allergies  Allergen Reactions  . Adhesive [Tape] Rash    Past Medical History:  Diagnosis Date  . Allergy   . Anemia   . Arthritis   . Asthma   . Back pain, chronic    GETS INJECTIONS IN BACK  . Blind right eye    hemorrhage  . Blood transfusion   . Cancer ([place])    breast 1994  . Clotting disorder ([place])   . Diabetes mellitus without complication ([place])    takes  precose  . Elevated liver enzymes   . Gallstones   . GERD (gastroesophageal reflux disease)   . HX: breast cancer   . Hyperlipidemia   . Hypertension   . Hypothyroid   . Lymphedema of arm    RT  . Morbid obesity ([place])   . OSA (obstructive sleep apnea)    has not used in 2 years-lost weight  . Osteoporosis   . Peripheral neuropathy    on gabapentin  . PONV (postoperative nausea and vomiting)   . Psoriasis   . Pulmonary embolism ([place]) 1998 / 1994 /1968  . Syncope and collapse 2011   due to anemia    Past Surgical History:  Procedure Laterality Date  . ABDOMINAL HYSTERECTOMY    . BONE MARROW TRANSPLANT  1994  . CARDIOVASCULAR STRESS TEST  05/23/2005   EF 53%  . carpal tunnell     bil  . cataracts    . CHOLECYSTECTOMY  05/10/2012   Procedure: LAPAROSCOPIC CHOLECYSTECTOMY WITH INTRAOPERATIVE CHOLANGIOGRAM;  Surgeon: Pedro Earls, MD;  Location: [facility];  Service: General;  Laterality: N/A;  Laparoscopic Cholecystectomy with Intraoperative Cholangiogram  . GASTRIC BYPASS  2011   bariatric surgery  . HIATAL HERNIA REPAIR    . IVC filter     recurrent DVT  . KNEE ARTHROTOMY  1998  . MASS EXCISION Right 11/24/2016   Procedure: EXCISION RIGHT AXILLARY LESION AND CHEST WALL LESION;  Surgeon: Johnathan Hausen, MD;  Location: [facility];  Service: General;  Laterality: Right;  . MASTECTOMY     Right  . US ECHOCARDIOGRAPHY  10/27/08   EF 55-60%    Family History  Problem Relation Age of Onset  . Sudden death Mother        car accident  . Cancer Father 49       lung  . Cancer Sister 75       lung cancer  . Asthma Daughter     Social History:  reports that she quit smoking about 42 years ago. Her  smoking use included cigarettes. She has a 5.00 pack-year smoking history. She has never used smokeless tobacco. She reports that she does not drink alcohol and does not use drugs.  Review of Systems    ABNORMAL liver functions: She has had these before  transiently and no etiology found Not related to statins  Lab Results  Component Value Date   ALT 26 09/27/2020   ALT 25 01/19/2016     Neuropathy with persistent burning, mild numbness and lower leg pain:  She gets relief with nortriptyline and gabapentin  MUSCLE cramps/spasms:  These are treated with 10 mg baclofen during the day twice daily and 20 mg at bedtime If she has a cramp  she will take an extra dose of baclofen 10 mg Symptoms are not controlled She thinks she needs more than 10 mg 3 times daily of her baclofen, has not discussed the prescription with her PCP at  She also uses tonic water, mustard and magnesium supplements  No history of hypokalemia  Lymphedema right arm, secondary to  breast surgery, using  elastic compression stocking but not getting enough relief   History of mild hypothyroidism: Has been present for several years and minimally symptomatic usually  Synthroid dose: 88 mcg daily  She has nonspecific weakness and fatigue; previously thyroid levels are consistent However TSH is now higher than usual at 8.6   Lab Results  Component Value Date   TSH 8.65 (H) 11/23/2020   TSH 1.31 09/27/2020   TSH 2.14 07/19/2020   FREET4 0.83 07/19/2020   FREET4 0.96 04/20/2020   FREET4 1.17 12/16/2019     Hypercholesterolemia: LDL has been at target with pravastatin Labs as below  Lipid levels:  Lab Results  Component Value Date   CHOL 183 07/19/2020   HDL 93.50 07/19/2020   LDLCALC 80 07/19/2020   TRIG 49.0 07/19/2020   CHOLHDL 2 07/19/2020   Lab Results  Component Value Date   ALT 26 09/27/2020   ALT 25 01/19/2016    No history of hypertension  BP Readings from Last 3 Encounters:  11/25/20 134/76  11/11/20 130/70  09/27/20 130/80     LABS:  Lab on 11/23/2020  Component Date Value Ref Range Status  . TSH 11/23/2020 8.65* 0.35 - 4.50 uIU/mL Final  . Sodium 11/23/2020 136  135 - 145 mEq/L Final  . Potassium 11/23/2020 4.3  3.5 -  5.1 mEq/L Final  . Chloride 11/23/2020 101  96 - 112 mEq/L Final  . CO2 11/23/2020 30  19 - 32 mEq/L Final  . Glucose, Bld 11/23/2020 113* 70 - 99 mg/dL Final  . BUN 11/23/2020 21  6 - 23 mg/dL Final  . Creatinine, Ser 11/23/2020 0.96  0.40 - 1.20 mg/dL Final  . GFR 11/23/2020 58.92* >60.00 mL/min Final   Calculated using the CKD-EPI Creatinine Equation (2021)  . Calcium 11/23/2020 9.7  8.4 - 10.5 mg/dL Final  . Hgb A1c MFr Bld 11/23/2020 6.9* 4.6 - 6.5 % Final   Glycemic Control Guidelines for People with Diabetes:Non Diabetic:  <6%Goal of Therapy: <7%Additional Action Suggested:  >8%   Anti-coag visit on 11/04/2020  Component Date Value Ref Range Status  . INR 11/04/2020 2.3  2.0 - 3.0 Final     EXAM:  BP 134/76   Pulse 62   Ht _0  (1.702 m)   Wt 220 lb 6.4 oz (100 kg)   SpO2 99%   BMI 34.52 kg/m      Assessment/Plan:   1.  HYPOTHYROIDISM: Clearly not symptomatic but her TSH is high and she will go up on her dose by taking extra 88 mcg weekly She will follow-up in 2 months   2. Reactive hypoglycemia secondary to gastrointestinal surgery:   She is variably symptomatic with not consistently having protein with meals especially breakfast Also sometimes may get more carbohydrate Since she has more symptoms after breakfast and lunch she will increase her acarbose at least at breakfast to 2 tablets Also add protein at breakfast when eating oatmeal and discussed options for protein  3. Mild diabetes: A1c 6.9 but blood sugars are not high when checked at home even postprandial Will continue only acarbose  4.   Leg edema: She will continue to work with PCP  5.  Muscle spasms and cramps of unclear etiology: Increased her baclofen to 20 mg and she will discuss switching prescription to her PCP  6.  Gabapentin will be renewed  Patient Instructions  Add a protein at Bfst daily  Precose 2-1--1 with meals  Synthroid 88, take 2 on Fridays   Elayne Snare 11/25/2020, 9:31  AM

## 2020-12-03 ENCOUNTER — Other Ambulatory Visit: Payer: Self-pay | Admitting: Endocrinology

## 2020-12-06 DIAGNOSIS — Z79891 Long term (current) use of opiate analgesic: Secondary | ICD-10-CM | POA: Diagnosis not present

## 2020-12-06 DIAGNOSIS — M5136 Other intervertebral disc degeneration, lumbar region: Secondary | ICD-10-CM | POA: Diagnosis not present

## 2020-12-06 DIAGNOSIS — D6869 Other thrombophilia: Secondary | ICD-10-CM | POA: Diagnosis not present

## 2020-12-06 DIAGNOSIS — M5416 Radiculopathy, lumbar region: Secondary | ICD-10-CM | POA: Diagnosis not present

## 2020-12-06 DIAGNOSIS — M5030 Other cervical disc degeneration, unspecified cervical region: Secondary | ICD-10-CM | POA: Diagnosis not present

## 2020-12-09 ENCOUNTER — Ambulatory Visit (INDEPENDENT_AMBULATORY_CARE_PROVIDER_SITE_OTHER): Payer: Medicare Other | Admitting: General Practice

## 2020-12-09 ENCOUNTER — Other Ambulatory Visit: Payer: Self-pay

## 2020-12-09 DIAGNOSIS — Z7901 Long term (current) use of anticoagulants: Secondary | ICD-10-CM

## 2020-12-09 LAB — POCT INR: INR: 3.4 — AB (ref 2.0–3.0)

## 2020-12-09 NOTE — Progress Notes (Signed)
Medical screening examination/treatment/procedure(s) were performed by non-physician practitioner and as supervising physician I was immediately available for consultation/collaboration. I agree with above. Yuna Pizzolato, MD   

## 2020-12-09 NOTE — Patient Instructions (Addendum)
Pre visit review using our clinic review tool, if applicable. No additional management support is needed unless otherwise documented below in the visit note.  Skip dosage today (5/12) and then continue to take to take 2 tablets daily except 1 tablet on Wednesdays.  Re-check in 3 weeks.

## 2020-12-10 ENCOUNTER — Other Ambulatory Visit: Payer: Self-pay | Admitting: Endocrinology

## 2020-12-13 DIAGNOSIS — L84 Corns and callosities: Secondary | ICD-10-CM | POA: Diagnosis not present

## 2020-12-13 DIAGNOSIS — I739 Peripheral vascular disease, unspecified: Secondary | ICD-10-CM | POA: Diagnosis not present

## 2020-12-13 DIAGNOSIS — L603 Nail dystrophy: Secondary | ICD-10-CM | POA: Diagnosis not present

## 2020-12-13 DIAGNOSIS — E1151 Type 2 diabetes mellitus with diabetic peripheral angiopathy without gangrene: Secondary | ICD-10-CM | POA: Diagnosis not present

## 2020-12-14 ENCOUNTER — Other Ambulatory Visit: Payer: Self-pay

## 2020-12-15 ENCOUNTER — Encounter: Payer: Self-pay | Admitting: Family Medicine

## 2020-12-15 ENCOUNTER — Ambulatory Visit (INDEPENDENT_AMBULATORY_CARE_PROVIDER_SITE_OTHER): Payer: Medicare Other | Admitting: Family Medicine

## 2020-12-15 VITALS — BP 122/78 | HR 66 | Temp 97.9°F | Wt 215.0 lb

## 2020-12-15 DIAGNOSIS — I83893 Varicose veins of bilateral lower extremities with other complications: Secondary | ICD-10-CM | POA: Diagnosis not present

## 2020-12-15 DIAGNOSIS — R252 Cramp and spasm: Secondary | ICD-10-CM

## 2020-12-15 DIAGNOSIS — E782 Mixed hyperlipidemia: Secondary | ICD-10-CM

## 2020-12-15 DIAGNOSIS — J302 Other seasonal allergic rhinitis: Secondary | ICD-10-CM

## 2020-12-15 DIAGNOSIS — I8393 Asymptomatic varicose veins of bilateral lower extremities: Secondary | ICD-10-CM

## 2020-12-15 DIAGNOSIS — I872 Venous insufficiency (chronic) (peripheral): Secondary | ICD-10-CM | POA: Diagnosis not present

## 2020-12-15 LAB — LIPID PANEL
Cholesterol: 239 mg/dL — ABNORMAL HIGH (ref 0–200)
HDL: 103.5 mg/dL (ref >39.00)
LDL Cholesterol: 123 mg/dL — ABNORMAL HIGH (ref 0–99)
NonHDL: 135.31
Total CHOL/HDL Ratio: 2
Triglycerides: 62 mg/dL (ref 0.0–149.0)
VLDL: 12.4 mg/dL (ref 0.0–40.0)

## 2020-12-15 LAB — BASIC METABOLIC PANEL
BUN: 19 mg/dL (ref 6–23)
CO2: 32 mEq/L (ref 19–32)
Calcium: 9.5 mg/dL (ref 8.4–10.5)
Chloride: 99 mEq/L (ref 96–112)
Creatinine, Ser: 0.89 mg/dL (ref 0.40–1.20)
GFR: 64.5 mL/min (ref >60.00)
Glucose, Bld: 109 mg/dL — ABNORMAL HIGH (ref 70–99)
Potassium: 3.7 mEq/L (ref 3.5–5.1)
Sodium: 139 mEq/L (ref 135–145)

## 2020-12-15 MED ORDER — PRAVASTATIN SODIUM 10 MG PO TABS: 10.0000 mg | ORAL_TABLET | Freq: Every day | ORAL | 2 refills | Status: DC

## 2020-12-15 NOTE — Progress Notes (Signed)
Subjective:    Patient ID: Kelli Martinez, female    DOB: [birth year], 75 y.o.   MRN: [id number]  Chief Complaint  Patient presents with  . Follow-up    HPI Patient was seen today for f/u on chronic issues.  Patient notes some improvement in muscle cramping since holding pravastatin.  Also taking baclofen 20 mg 3 times daily.   Taking Lasix at night.  May get up 2-3 times to urinate.  Inquires about continuing potassium.  Patient also notes improvement in LE edema.  Able to get shoes on to go to church.  Difficult to put on TED hose.  Decreased water intake thinking it may help with edema.  Having increased nasal drainage when bending over to tie her shoes in AM.  Has follow-up appointment with Dr. Herma Mering on 5/21 to do injection in back.  States will be able to do injection without discontinuing warfarin.  Past Medical History:  Diagnosis Date  . Allergy   . Anemia   . Arthritis   . Asthma   . Back pain, chronic    GETS INJECTIONS IN BACK  . Blind right eye    hemorrhage  . Blood transfusion   . Cancer ([place])    breast 1994  . Clotting disorder ([place])   . Diabetes mellitus without complication ([place])    takes precose  . Elevated liver enzymes   . Gallstones   . GERD (gastroesophageal reflux disease)   . HX: breast cancer   . Hyperlipidemia   . Hypertension   . Hypothyroid   . Lymphedema of arm    RT  . Morbid obesity ([place])   . OSA (obstructive sleep apnea)    has not used in 2 years-lost weight  . Osteoporosis   . Peripheral neuropathy    on gabapentin  . PONV (postoperative nausea and vomiting)   . Psoriasis   . Pulmonary embolism ([place]) 1998 / 1994 /1968  . Syncope and collapse 2011   due to anemia    Allergies  Allergen Reactions  . Adhesive [Tape] Rash   Family History  Problem Relation Age of Onset  . Sudden death Mother        car accident  . Cancer Father 19       lung  . Cancer Sister 84       lung cancer  . Asthma Daughter      ROS General: Denies fever, chills, night sweats, changes in weight, changes in appetite HEENT: Denies headaches, ear pain, changes in vision, rhinorrhea, sore throat  + increased nasal drainage CV: Denies CP, palpitations, SOB, orthopnea + LE edema Pulm: Denies SOB, cough, wheezing GI: Denies abdominal pain, nausea, vomiting, diarrhea, constipation GU: Denies dysuria, hematuria, frequency, vaginal discharge Msk: Denies joint pains  + muscle cramping, back pain Neuro: Denies weakness, numbness, tingling Skin: Denies rashes, bruising Psych: Denies depression, anxiety, hallucinations     Objective:    Blood pressure 122/78, pulse 66, temperature 97.9 F (36.6 C), temperature source Oral, weight 215 lb (97.5 kg), SpO2 97 %.  Gen. Pleasant, well-nourished, in no distress, normal affect  HEENT: [place]/AT, face symmetric, conjunctiva clear, no scleral icterus, PERRLA, EOMI, nares patent without drainage, pharynx without erythema or exudate. Lungs: no accessory muscle use, CTAB, no wheezes or rales Cardiovascular: RRR, no m/r/g, no peripheral edema Abdomen: BS present, soft, NT/ND, no hepatosplenomegaly. Musculoskeletal: No deformities, no cyanosis or clubbing, normal tone Neuro:  A&Ox3, CN II-XII intact, has cane to aid with ambulation  if needed Skin:  Warm, dry, intact.  Increased pigmentation of bilateral ankles.  Faint varicose veins and spider veins noted on bilateral LEs, without TTP or drainage.  Diabetic Foot Exam - Simple   Simple Foot Form Diabetic Foot exam was performed with the following findings: Yes 12/15/2020  2:44 PM  Visual Inspection No deformities, no ulcerations, no other skin breakdown bilaterally: Yes See comments: Yes Sensation Testing Intact to touch and monofilament testing bilaterally: Yes Pulse Check Posterior Tibialis and Dorsalis pulse intact bilaterally: Yes Comments Bilateral pedal edema, 1+.    Wt Readings from Last 3 Encounters:  12/15/20 215 lb  (97.5 kg)  11/25/20 220 lb 6.4 oz (100 kg)  11/11/20 215 lb 12.8 oz (97.9 kg)    Lab Results  Component Value Date   WBC 3.8 (L) 09/27/2020   HGB 12.2 09/27/2020   HCT 37.3 09/27/2020   PLT 161.0 09/27/2020   GLUCOSE 113 (H) 11/23/2020   CHOL 183 07/19/2020   TRIG 49.0 07/19/2020   HDL 93.50 07/19/2020   LDLCALC 80 07/19/2020   ALT 26 09/27/2020   AST 24 09/27/2020   NA 136 11/23/2020   K 4.3 11/23/2020   CL 101 11/23/2020   CREATININE 0.96 11/23/2020   BUN 21 11/23/2020   CO2 30 11/23/2020   TSH 8.65 (H) 11/23/2020   INR 3.4 (A) 12/09/2020   HGBA1C 6.9 (H) 11/23/2020   MICROALBUR <0.7 12/16/2019    Assessment/Plan:  Edema of both lower extremities due to peripheral venous insufficiency  -Continue supportive care including elevation, compression socks as unable to get on TED hose/cause deep indentations in LEs. -Discussed wrapping LEs, however patient may have difficulty getting on/off -We will obtain labs -Discussed referral to vascular for possible treatment of varicose and spider veins - Plan: Basic metabolic panel, Ambulatory referral to Vascular Surgery, potassium chloride SA (KLOR-CON) 20 MEQ tablet, furosemide (LASIX) 20 MG tablet  Mixed hyperlipidemia  -Cholesterol controlled on medication her lipid panel 07/19/2020 -We will repeat lipid panel as currently holding statin due to myalgias and muscle cramping -We will try low-dose pravastatin daily.  Can even consider QOD dosing. - Plan: Lipid panel, pravastatin (PRAVACHOL) 10 MG tablet  Seasonal allergies -Discussed restarting OTC allergy medication(s) such as azelastine nasal spray or Allegra  Spider veins of both lower extremities  -Treatment of symptoms with supportive garments such as compression socks - Plan: Ambulatory referral to Vascular Surgery  Varicose veins of both legs with edema -Treatment of symptoms with supportive garment such as compression socks. - Plan: Ambulatory referral to Vascular  Surgery  Muscle cramps  -Improving some -Continue baclofen 20 mg 3 times daily, Klor-Con 20 mEq daily, Slow-Mag 500 mg nightly -We will restart pravastatin at 10 mg dose.  Monitor for increased symptoms - Plan: potassium chloride SA (KLOR-CON) 20 MEQ tablet  F/u prn in 1-2 months  Grier Mitts, MD

## 2020-12-15 NOTE — Patient Instructions (Addendum)
A prescription for pravastatin 10 mg was sent to your local pharmacy.  We will see if your cramping increases with restarting this med.  If it dose we can discontinue it.  A Referral was placed for you to see the vascular surgeon for your legs.  You should expect a call about scheduling this appointment soon.  Chronic Venous Insufficiency Chronic venous insufficiency is a condition where the leg veins cannot effectively pump blood from the legs to the heart. This happens when the vein walls are either stretched, weakened, or damaged, or when the valves inside the vein are damaged. With the right treatment, you should be able to continue with an active life. This condition is also called venous stasis. What are the causes? Common causes of this condition include:  High blood pressure inside the veins (venous hypertension).  Sitting or standing too long, causing increased blood pressure in the leg veins.  A blood clot that blocks blood flow in a vein (deep vein thrombosis, DVT).  Inflammation of a vein (phlebitis) that causes a blood clot to form.  Tumors in the pelvis that cause blood to back up. What increases the risk? The following factors may make you more likely to develop this condition:  Having a family history of this condition.  Obesity.  Pregnancy.  Living without enough regular physical activity or exercise (sedentary lifestyle).  Smoking.  Having a job that requires long periods of standing or sitting in one place.  Being a certain age. Women in their 76s and 43s and men in their 4s are more likely to develop this condition. What are the signs or symptoms? Symptoms of this condition include:  Veins that are enlarged, bulging, or twisted (varicose veins).  Skin breakdown or ulcers.  Reddened skin or dark discoloration of skin on the leg between the knee and ankle.  Brown, smooth, tight, and painful skin just above the ankle, usually on the inside of the leg  (lipodermatosclerosis).  Swelling of the legs. How is this diagnosed? This condition may be diagnosed based on:  Your medical history.  A physical exam.  Tests, such as: ? A procedure that creates an image of a blood vessel and nearby organs and provides information about blood flow through the blood vessel (duplex ultrasound). ? A procedure that tests blood flow (plethysmography). ? A procedure that looks at the veins using X-ray and dye (venogram). How is this treated? The goals of treatment are to help you return to an active life and to minimize pain or disability. Treatment depends on the severity of your condition, and it may include:  Wearing compression stockings. These can help relieve symptoms and help prevent your condition from getting worse. However, they do not cure the condition.  Sclerotherapy. This procedure involves an injection of a solution that shrinks damaged veins.  Surgery. This may involve: ? Removing a diseased vein (vein stripping). ? Cutting off blood flow through the vein (laser ablation surgery). ? Repairing or reconstructing a valve within the affected vein.   Follow these instructions at home:  Wear compression stockings as told by your health care provider. These stockings help to prevent blood clots and reduce swelling in your legs.  Take over-the-counter and prescription medicines only as told by your health care provider.  Stay active by exercising, walking, or doing different activities. Ask your health care provider what activities are safe for you and how much exercise you need.  Drink enough fluid to keep your urine pale yellow.  Do not use any products that contain nicotine or tobacco, such as cigarettes, e-cigarettes, and chewing tobacco. If you need help quitting, ask your health care provider.  Keep all follow-up visits as told by your health care provider. This is important.      Contact a health care provider if you:  Have  redness, swelling, or more pain in the affected area.  See a red streak or line that goes up or down from the affected area.  Have skin breakdown or skin loss in the affected area, even if the breakdown is small.  Get an injury in the affected area. Get help right away if:  You get an injury and an open wound in the affected area.  You have: ? Severe pain that does not get better with medicine. ? Sudden numbness or weakness in the foot or ankle below the affected area. ? Trouble moving your foot or ankle. ? A fever. ? Worse or persistent symptoms. ? Chest pain. ? Shortness of breath. Summary  Chronic venous insufficiency is a condition where the leg veins cannot effectively pump blood from the legs to the heart.  Chronic venous insufficiency occurs when the vein walls become stretched, weakened, or damaged, or when valves within the vein are damaged.  Treatment depends on how severe your condition is. It often involves wearing compression stockings and may involve having a procedure.  Make sure you stay active by exercising, walking, or doing different activities. Ask your health care provider what activities are safe for you and how much exercise you need. This information is not intended to replace advice given to you by your health care provider. Make sure you discuss any questions you have with your health care provider. Document Revised: 04/09/2018 Document Reviewed: 04/09/2018 Elsevier Patient Education  2021 [place].  Muscle Cramps and Spasms Muscle cramps and spasms occur when a muscle or muscles tighten and you have no control over this tightening (involuntary muscle contraction). They are a common problem and can develop in any muscle. The most common place is in the calf muscles of the leg. Muscle cramps and muscle spasms are both involuntary muscle contractions, but there are some differences between the two:  Muscle cramps are painful. They come and go and may  last for a few seconds or up to 15 minutes. Muscle cramps are often more forceful and last longer than muscle spasms.  Muscle spasms may or may not be painful. They may also last just a few seconds or much longer. Certain medical conditions, such as diabetes or Parkinson's disease, can make it more likely to develop cramps or spasms. However, cramps or spasms are usually not caused by a serious underlying problem. Common causes include:  Doing more physical work or exercise than your body is ready for (overexertion).  Overuse from repeating certain movements too many times.  Remaining in a certain position for a long period of time.  Improper preparation, form, or technique while playing a sport or doing an activity.  Dehydration.  Injury.  Side effects of some medicines.  Abnormally low levels of the salts and minerals in your blood (electrolytes), especially potassium and calcium. This could happen if you are taking water pills (diuretics) or if you are pregnant. In many cases, the cause of muscle cramps or spasms is not known. Follow these instructions at home: Managing pain and stiffness  Try massaging, stretching, and relaxing the affected muscle. Do this for several minutes at a time.  If  directed, apply heat to tight or tense muscles as often as told by your health care provider. Use the heat source that your health care provider recommends, such as a moist heat pack or a heating pad. ? Place a towel between your skin and the heat source. ? Leave the heat on for 20-30 minutes. ? Remove the heat if your skin turns bright red. This is especially important if you are unable to feel pain, heat, or cold. You may have a greater risk of getting burned.  If directed, put ice on the affected area. This may help if you are sore or have pain after a cramp or spasm. ? Put ice in a plastic bag. ? Place a towel between your skin and the bag. ? Leavethe ice on for 20 minutes, 2-3 times a  day.  Try taking hot showers or baths to help relax tight muscles.      Eating and drinking  Drink enough fluid to keep your urine pale yellow. Staying well hydrated may help prevent cramps or spasms.  Eat a healthy diet that includes plenty of nutrients to help your muscles function. A healthy diet includes fruits and vegetables, lean protein, whole grains, and low-fat or nonfat dairy products. General instructions  If you are having frequent cramps, avoid intense exercise for several days.  Take over-the-counter and prescription medicines only as told by your health care provider.  Pay attention to any changes in your symptoms.  Keep all follow-up visits as told by your health care provider. This is important. Contact a health care provider if:  Your cramps or spasms get more severe or happen more often.  Your cramps or spasms do not improve over time. Summary  Muscle cramps and spasms occur when a muscle or muscles tighten and you have no control over this tightening (involuntary muscle contraction).  The most common place for cramps or spasms to occur is in the calf muscles of the leg.  Massaging, stretching, and relaxing the affected muscle may relieve the cramp or spasm.  Drink enough fluid to keep your urine pale yellow. Staying well hydrated may help prevent cramps or spasms. This information is not intended to replace advice given to you by your health care provider. Make sure you discuss any questions you have with your health care provider. Document Revised: 12/10/2017 Document Reviewed: 12/10/2017 Elsevier Patient Education  [place].

## 2020-12-16 MED ORDER — POTASSIUM CHLORIDE CRYS ER 20 MEQ PO TBCR
20.0000 meq | EXTENDED_RELEASE_TABLET | Freq: Every day | ORAL | 1 refills | Status: DC
Start: 2020-12-16 — End: 2021-01-17

## 2020-12-16 MED ORDER — FUROSEMIDE 20 MG PO TABS
20.0000 mg | ORAL_TABLET | Freq: Every day | ORAL | 1 refills | Status: DC
Start: 2020-12-16 — End: 2021-01-17

## 2020-12-20 ENCOUNTER — Other Ambulatory Visit: Payer: Self-pay

## 2020-12-20 DIAGNOSIS — I872 Venous insufficiency (chronic) (peripheral): Secondary | ICD-10-CM

## 2020-12-20 DIAGNOSIS — R252 Cramp and spasm: Secondary | ICD-10-CM

## 2020-12-20 NOTE — Progress Notes (Signed)
Patient is aware 

## 2020-12-23 ENCOUNTER — Other Ambulatory Visit: Payer: Self-pay | Admitting: *Deleted

## 2020-12-23 DIAGNOSIS — I872 Venous insufficiency (chronic) (peripheral): Secondary | ICD-10-CM

## 2020-12-28 ENCOUNTER — Encounter: Payer: Self-pay | Admitting: Family Medicine

## 2020-12-28 DIAGNOSIS — M5416 Radiculopathy, lumbar region: Secondary | ICD-10-CM | POA: Diagnosis not present

## 2020-12-30 ENCOUNTER — Other Ambulatory Visit: Payer: Self-pay

## 2020-12-30 ENCOUNTER — Ambulatory Visit (INDEPENDENT_AMBULATORY_CARE_PROVIDER_SITE_OTHER): Payer: Medicare Other | Admitting: General Practice

## 2020-12-30 DIAGNOSIS — Z7901 Long term (current) use of anticoagulants: Secondary | ICD-10-CM | POA: Diagnosis not present

## 2020-12-30 LAB — POCT INR: INR: 3.2 — AB (ref 2.0–3.0)

## 2020-12-30 NOTE — Patient Instructions (Addendum)
Pre visit review using our clinic review tool, if applicable. No additional management support is needed unless otherwise documented below in the visit note.  Take 1 tablet today (5 mg) and then change dosage and take 2 tablets daily except 1 tablet on Wed and Saturdays.  Re-check in 4 weeks.

## 2021-01-12 DIAGNOSIS — Z79891 Long term (current) use of opiate analgesic: Secondary | ICD-10-CM | POA: Diagnosis not present

## 2021-01-14 ENCOUNTER — Other Ambulatory Visit: Payer: Self-pay

## 2021-01-17 ENCOUNTER — Ambulatory Visit (INDEPENDENT_AMBULATORY_CARE_PROVIDER_SITE_OTHER): Payer: Medicare Other | Admitting: Family Medicine

## 2021-01-17 ENCOUNTER — Encounter: Payer: Self-pay | Admitting: Family Medicine

## 2021-01-17 ENCOUNTER — Other Ambulatory Visit: Payer: Self-pay

## 2021-01-17 VITALS — BP 120/76 | HR 66 | Temp 98.1°F | Wt 221.4 lb

## 2021-01-17 DIAGNOSIS — R252 Cramp and spasm: Secondary | ICD-10-CM

## 2021-01-17 DIAGNOSIS — E782 Mixed hyperlipidemia: Secondary | ICD-10-CM

## 2021-01-17 DIAGNOSIS — I872 Venous insufficiency (chronic) (peripheral): Secondary | ICD-10-CM | POA: Diagnosis not present

## 2021-01-17 MED ORDER — POTASSIUM CHLORIDE CRYS ER 20 MEQ PO TBCR: 20.0000 meq | EXTENDED_RELEASE_TABLET | Freq: Two times a day (BID) | ORAL | 2 refills | Status: DC

## 2021-01-17 MED ORDER — PRAVASTATIN SODIUM 10 MG PO TABS: 10.0000 mg | ORAL_TABLET | Freq: Every day | ORAL | 3 refills | Status: DC

## 2021-01-17 MED ORDER — FUROSEMIDE 20 MG PO TABS: 20.0000 mg | ORAL_TABLET | Freq: Two times a day (BID) | ORAL | 2 refills | Status: DC

## 2021-01-17 NOTE — Progress Notes (Signed)
Subjective:    Patient ID: Kelli Martinez, female    DOB: [birth year], 75 y.o.   MRN: [id number]  Chief Complaint  Patient presents with   Follow-up    HPI Patient was seen today for f/u on LE edema.  Pt states edema worse x last week and a half.  Pt unable to wear her regular shoes.  Elevated LEs.  States LEs were tripled in size with edema in thighs.  Pt was concerned she may have had a blood clot as her legs were sore, erythematous, and pruritic.  Legs improved after swelling decreased.  Pt also with lymphedema of right UE 2/2 h/o breast cancer.  Taking Lasix 20 mg afternoon to avoid having to use the restroom when running errands.  Having less muscle cramps.  May have had 2 in the last month.  If moves around at start of cramp can get it to stop. In the past cramps so bad she called EMS.  Pravastatin reduced to 10 mg due to concern it may be contributing to cramps, needs refill.  Pt had recent injections for lumbar radiculopathy. Past Medical History:  Diagnosis Date   Allergy    Anemia    Arthritis    Asthma    Back pain, chronic    GETS INJECTIONS IN BACK   Blind right eye    hemorrhage   Blood transfusion    Cancer ([place])    breast 1994   Clotting disorder ([place])    Diabetes mellitus without complication ([place])    takes precose   Elevated liver enzymes    Gallstones    GERD (gastroesophageal reflux disease)    HX: breast cancer    Hyperlipidemia    Hypertension    Hypothyroid    Lymphedema of arm    RT   Morbid obesity ([facility])    OSA (obstructive sleep apnea)    has not used in 2 years-lost weight   Osteoporosis    Peripheral neuropathy    on gabapentin   PONV (postoperative nausea and vomiting)    Psoriasis    Pulmonary embolism ([place]) 1998 / 1994 /1968   Syncope and collapse 2011   due to anemia    Allergies  Allergen Reactions   Adhesive [Tape] Rash    ROS General: Denies fever, chills, night sweats, changes in weight, changes in appetite HEENT:  Denies headaches, ear pain, changes in vision, rhinorrhea, sore throat CV: Denies CP, palpitations, SOB, orthopnea  +b/l LE edema, RUE edema Pulm: Denies SOB, cough, wheezing GI: Denies abdominal pain, nausea, vomiting, diarrhea, constipation GU: Denies dysuria, hematuria, frequency, vaginal discharge Msk: Denies muscle cramps, joint pains  +muscle cramps Neuro: Denies weakness, numbness, tingling Skin: Denies rashes, bruising Psych: Denies depression, anxiety, hallucinations     Objective:    Blood pressure 120/76, pulse 66, temperature 98.1 F (36.7 C), temperature source Oral, weight 221 lb 6.4 oz (100.4 kg), SpO2 98 %.  Gen. Pleasant, well-nourished, in no distress, normal affect   HEENT: [place]/AT, face symmetric, conjunctiva clear, no scleral icterus, PERRLA, EOMI, nares patent without drainage, pharynx without erythema or exudate. Neck: No JVD, no thyromegaly, no carotid bruits Lungs: no accessory muscle use, CTAB, no wheezes or rales Cardiovascular: RRR, no m/r/g, trace pitting edema from feet to knees b/l with tenderness Abdomen: BS present, soft, NT/ND, no hepatosplenomegaly. Musculoskeletal: TTP of bilateral LEs.  No deformities, no cyanosis or clubbing, normal tone Neuro:  A&Ox3, CN II-XII intact, normal gait Skin:  Warm, no lesions/  rash   Wt Readings from Last 3 Encounters:  01/17/21 221 lb 6.4 oz (100.4 kg)  12/15/20 215 lb (97.5 kg)  11/25/20 220 lb 6.4 oz (100 kg)    Lab Results  Component Value Date   WBC 3.8 (L) 09/27/2020   HGB 12.2 09/27/2020   HCT 37.3 09/27/2020   PLT 161.0 09/27/2020   GLUCOSE 109 (H) 12/15/2020   CHOL 239 (H) 12/15/2020   TRIG 62.0 12/15/2020   HDL 103.50 12/15/2020   LDLCALC 123 (H) 12/15/2020   ALT 26 09/27/2020   AST 24 09/27/2020   NA 139 12/15/2020   K 3.7 12/15/2020   CL 99 12/15/2020   CREATININE 0.89 12/15/2020   BUN 19 12/15/2020   CO2 32 12/15/2020   TSH 8.65 (H) 11/23/2020   INR 3.2 (A) 12/30/2020   HGBA1C 6.9 (H)  11/23/2020   MICROALBUR <0.7 12/16/2019    Assessment/Plan:  Edema of both lower extremities due to peripheral venous insufficiency  -lasix 20 mEq BID with Kdur 20 mEq BID -continue supportive care -given handout - Plan: furosemide (LASIX) 20 MG tablet, potassium chloride SA (KLOR-CON) 20 MEQ tablet  Chronic venous stasis dermatitis of both lower extremities -discussed continuing supportive care including elevating LEs and limiting sodium intake -unable to do compression socks or TED hose as difficult to get on -consider f/u with vascular  Muscle cramps  - Plan: potassium chloride SA (KLOR-CON) 20 MEQ tablet  Mixed hyperlipidemia  -continue lifestyle modifications - Plan: pravastatin (PRAVACHOL) 10 MG tablet  F/u prn in the next 2-4 wks  Grier Mitts, MD

## 2021-01-21 ENCOUNTER — Other Ambulatory Visit: Payer: Self-pay

## 2021-01-21 ENCOUNTER — Other Ambulatory Visit (INDEPENDENT_AMBULATORY_CARE_PROVIDER_SITE_OTHER): Payer: Medicare Other

## 2021-01-21 DIAGNOSIS — E039 Hypothyroidism, unspecified: Secondary | ICD-10-CM

## 2021-01-21 DIAGNOSIS — E1142 Type 2 diabetes mellitus with diabetic polyneuropathy: Secondary | ICD-10-CM | POA: Diagnosis not present

## 2021-01-21 LAB — LIPID PANEL
Cholesterol: 189 mg/dL (ref 0–200)
HDL: 92.9 mg/dL (ref >39.00)
LDL Cholesterol: 86 mg/dL (ref 0–99)
NonHDL: 96.25
Total CHOL/HDL Ratio: 2
Triglycerides: 53 mg/dL (ref 0.0–149.0)
VLDL: 10.6 mg/dL (ref 0.0–40.0)

## 2021-01-21 LAB — BASIC METABOLIC PANEL
BUN: 19 mg/dL (ref 6–23)
CO2: 30 mEq/L (ref 19–32)
Calcium: 10 mg/dL (ref 8.4–10.5)
Chloride: 100 mEq/L (ref 96–112)
Creatinine, Ser: 1.01 mg/dL (ref 0.40–1.20)
GFR: 55.38 mL/min — ABNORMAL LOW (ref >60.00)
Glucose, Bld: 108 mg/dL — ABNORMAL HIGH (ref 70–99)
Potassium: 4.8 mEq/L (ref 3.5–5.1)
Sodium: 138 mEq/L (ref 135–145)

## 2021-01-21 LAB — T4, FREE: Free T4: 0.92 ng/dL (ref 0.60–1.60)

## 2021-01-21 LAB — TSH: TSH: 2.32 u[IU]/mL (ref 0.35–4.50)

## 2021-01-25 ENCOUNTER — Other Ambulatory Visit: Payer: Self-pay

## 2021-01-25 ENCOUNTER — Ambulatory Visit (INDEPENDENT_AMBULATORY_CARE_PROVIDER_SITE_OTHER): Payer: Medicare Other | Admitting: Endocrinology

## 2021-01-25 ENCOUNTER — Encounter: Payer: Self-pay | Admitting: Endocrinology

## 2021-01-25 VITALS — BP 122/70 | HR 99 | Ht 67.5 in | Wt 220.8 lb

## 2021-01-25 DIAGNOSIS — K912 Postsurgical malabsorption, not elsewhere classified: Secondary | ICD-10-CM | POA: Diagnosis not present

## 2021-01-25 DIAGNOSIS — E1142 Type 2 diabetes mellitus with diabetic polyneuropathy: Secondary | ICD-10-CM | POA: Diagnosis not present

## 2021-01-25 DIAGNOSIS — E039 Hypothyroidism, unspecified: Secondary | ICD-10-CM

## 2021-01-25 DIAGNOSIS — E7800 Pure hypercholesterolemia, unspecified: Secondary | ICD-10-CM

## 2021-01-25 MED ORDER — ACARBOSE 50 MG PO TABS: ORAL_TABLET | ORAL | 2 refills | Status: DC

## 2021-01-25 NOTE — Progress Notes (Signed)
Patient ID: Kelli Martinez, female   DOB: Oct 11, 1945, 75 y.o.   MRN: [id number]    Chief complaint: Followup of various chronic problems    History of Present Illness:  She is here for follow-up:  1.  Postprandial hypoglycemia related to dumping syndrome   She has had postprandial hypoglycemia starting a couple of years after her gastric bypass surgery Her symptoms of blood sugars are mostly a feeling of significant weakness, some shakiness.  She will use glucose tablets or juice in order to relieve the symptoms, recently she thinks that 1 or 2 glucose tablets are usually adequate She has been to the dietitian and has been instructed on balanced low-fat meals with enough protein consistently and restricting carbohydrates including fruits   Recent history:  She has been treated with acarbose 25 mg before meals  She did try verapamil previously benefit  She is taking her Precose before meals, taking 2 before breakfast, 1 tablet at lunch and 1 at dinnertime   See still feels that she has episodes of low blood sugar with symptoms of shakiness  She was told to take 2 tablets of ACARBOSE at lunchtime but she is only taking 1, currently taking 2 tablets at breakfast  She now says that she may get more low sugar episodes late afternoon and before supper  However she is usually eating a sandwich with 2 slices of bread at lunchtime and only occasionally a salad with protein She may not always eat breakfast but if she does she will have yogurt Low blood sugars have not been documented lately and lowest sugar was 65 before supper However she will tend to treat the symptoms of shakiness with a glucose tablet without monitoring  Metformin was stopped previously   2.  SWELLING of the legs:  She has had significant problems with her legs and feet chronically She has been followed by PCP  Swelling increases at the end of the day  Control is variable with taking  Aldactone 50 mg twice daily  Also taking 20 mg Lasix daily; gets cramping with higher doses  Usually she does not like to wear compression stockings because of discomfort and difficulty putting them on  Renal function and potassium are consistently normal  Lab Results  Component Value Date   K 4.8 01/21/2021   Lab Results  Component Value Date   CREATININE 1.01 01/21/2021   CREATININE 0.89 12/15/2020   CREATININE 0.96 11/23/2020     PROBLEM 3:  DIABETES:  This has been mild and well controlled since her gastric bypass surgery  The A1c usually higher than expected, last 6.9  Current management, blood sugar patterns: She has only been on acarbose for treatment  Blood sugar readings are fairly good with only sporadic high readings Also fasting readings are not consistently above 100 even without metformin, lab glucose 108 She usually tries to do some chair exercises   She was previously told to stop her Metformin  because of tendency to hypoglycemia symptoms  Glucose readings from monitor review:    PRE-MEAL Fasting Lunch Dinner Bedtime Overall  Glucose range: 89-116 109-178 65-105    Mean/median:     102   POST-MEAL PC Breakfast PC Lunch PC Dinner  Glucose range:   172  Mean/median:          Wt Readings from Last 3 Encounters:  01/25/21 220 lb 12.8 oz (100.2 kg)  01/17/21 221 lb 6.4 oz (100.4 kg)  12/15/20 215 lb (97.5  kg)    Lab Results  Component Value Date   HGBA1C 6.9 (H) 11/23/2020   HGBA1C 6.5 09/27/2020   HGBA1C 6.5 07/19/2020   Lab Results  Component Value Date   MICROALBUR <0.7 12/16/2019   LDLCALC 86 01/21/2021   CREATININE 1.01 01/21/2021      Other active problems: see review of systems     Allergies as of 01/25/2021       Reactions   Adhesive [tape] Rash        Medication List        Accurate as of January 25, 2021  2:23 PM. If you have any questions, ask your nurse or doctor.          acarbose 25 MG tablet Commonly  known as: PRECOSE TAKE 1 TABLET AT THE START OF BREAKFAST AND DINNER AND 2 TABLETS AT LUNCH   albuterol 108 (90 Base) MCG/ACT inhaler Commonly known as: ProAir HFA Inhale 2 puffs into the lungs every 6 (six) hours as needed for wheezing or shortness of breath.   azelastine 0.1 % nasal spray Commonly known as: ASTELIN 1-2 puffs each nostril twice daily as needed   baclofen 20 MG tablet Commonly known as: LIORESAL Take 1 tablet (20 mg total) by mouth 3 (three) times daily.   Calcium Carbonate-Vitamin D 600-400 MG-UNIT tablet Take 1 tablet by mouth daily.   clobetasol cream 0.05 % Commonly known as: TEMOVATE APPLY 1 APPLICATION TOPICALLY TWICE A DAY   COVID-19 mRNA vaccine (Pfizer) 30 MCG/0.3ML injection   diazepam 10 MG Gel Commonly known as: DIASTAT ACUDIAL Place 5 mg rectally once as needed (cramping).   ferrous sulfate 325 (65 FE) MG tablet Take 325 mg by mouth daily.   fluticasone 50 MCG/ACT nasal spray Commonly known as: FLONASE Place 2 sprays into both nostrils daily.   Fluticasone-Salmeterol 100-50 MCG/DOSE Aepb Commonly known as: Advair Diskus Inhale 1 puff into the lungs every 12 (twelve) hours. USE 1 INHALATION EVERY 12 HOURS, RINSE MOUTH   FreeStyle Freedom Lite w/Device Kit Use as directed to check blood sugar once daily.   freestyle lancets USE AS INSTRUCTED TO CHECK BLOOD SUGAR ONCE DAILY   FREESTYLE LITE test strip Generic drug: glucose blood USE AS INSTRUCTED TO CHECK BLOOD SUGAR ONCE DAILY   furosemide 20 MG tablet Commonly known as: LASIX Take 1 tablet (20 mg total) by mouth 2 (two) times daily.   gabapentin 600 MG tablet Commonly known as: NEURONTIN Take 1 tablet (600 mg total) by mouth 3 (three) times daily.   HYDROcodone-acetaminophen 5-325 MG tablet Commonly known as: Norco Take 1 tablet by mouth 2 (two) times daily as needed for moderate pain. What changed:  how much to take when to take this reasons to take this   levothyroxine  100 MCG tablet Commonly known as: Synthroid Take 1 tablet (100 mcg total) by mouth daily before breakfast.   methylcellulose 1 % ophthalmic solution Commonly known as: ARTIFICIAL TEARS Place 1 drop into both eyes as needed. Dry eyes   omeprazole 20 MG capsule Commonly known as: PRILOSEC Take 1 capsule (20 mg total) by mouth daily.   potassium chloride SA 20 MEQ tablet Commonly known as: KLOR-CON Take 1 tablet (20 mEq total) by mouth 2 (two) times daily.   pravastatin 10 MG tablet Commonly known as: PRAVACHOL Take 1 tablet (10 mg total) by mouth daily.   SLOW-MAG PO Take 500 mg by mouth at bedtime.   spironolactone 50 MG tablet Commonly known as: ALDACTONE  Take 1 tablet (50 mg total) by mouth 2 (two) times daily.   temazepam 15 MG capsule Commonly known as: RESTORIL 1-2 for sleep as needed What changed:  how much to take how to take this when to take this reasons to take this   vitamin B-12 500 MCG tablet Commonly known as: CYANOCOBALAMIN Take 500 mcg by mouth daily.   Vitamin D (Ergocalciferol) 1.25 MG (50000 UNIT) Caps capsule Commonly known as: DRISDOL TAKE 1 CAPSULE EVERY FRIDAY   warfarin 5 MG tablet Commonly known as: COUMADIN Take as directed by the anticoagulation clinic. If you are unsure how to take this medication, talk to your nurse or doctor. Original instructions: Please take 2 tablets daily except take 1 tablet on Wed or Take as directed by anticoagulation clinic        Allergies:  Allergies  Allergen Reactions   Adhesive [Tape] Rash    Past Medical History:  Diagnosis Date   Allergy    Anemia    Arthritis    Asthma    Back pain, chronic    GETS INJECTIONS IN BACK   Blind right eye    hemorrhage   Blood transfusion    Cancer ([place])    breast 1994   Clotting disorder ([place])    Diabetes mellitus without complication ([place])    takes precose   Elevated liver enzymes    Gallstones    GERD (gastroesophageal reflux disease)    HX:  breast cancer    Hyperlipidemia    Hypertension    Hypothyroid    Lymphedema of arm    RT   Morbid obesity ([facility])    OSA (obstructive sleep apnea)    has not used in 2 years-lost weight   Osteoporosis    Peripheral neuropathy    on gabapentin   PONV (postoperative nausea and vomiting)    Psoriasis    Pulmonary embolism ([place]) 1998 / 1994 /1968   Syncope and collapse 2011   due to anemia    Past Surgical History:  Procedure Laterality Date   ABDOMINAL HYSTERECTOMY     BONE MARROW TRANSPLANT  1994   CARDIOVASCULAR STRESS TEST  05/23/2005   EF 53%   carpal tunnell     bil   cataracts     CHOLECYSTECTOMY  05/10/2012   Procedure: LAPAROSCOPIC CHOLECYSTECTOMY WITH INTRAOPERATIVE CHOLANGIOGRAM;  Surgeon: Pedro Earls, MD;  Location: [facility];  Service: General;  Laterality: N/A;  Laparoscopic Cholecystectomy with Intraoperative Cholangiogram   GASTRIC BYPASS  2011   bariatric surgery   HIATAL HERNIA REPAIR     IVC filter     recurrent DVT   KNEE ARTHROTOMY  1998   MASS EXCISION Right 11/24/2016   Procedure: EXCISION RIGHT AXILLARY LESION AND CHEST WALL LESION;  Surgeon: Johnathan Hausen, MD;  Location: [facility];  Service: General;  Laterality: Right;   MASTECTOMY     Right   US ECHOCARDIOGRAPHY  10/27/08   EF 55-60%    Family History  Problem Relation Age of Onset   Sudden death Mother        car accident   Cancer Father 51       lung   Cancer Sister 10       lung cancer   Asthma Daughter     Social History:  reports that she quit smoking about 42 years ago. Her smoking use included cigarettes. She has a 5.00 pack-year smoking history. She has never used smokeless tobacco. She  reports that she does not drink alcohol and does not use drugs.  Review of Systems    ABNORMAL liver functions: She has had these before transiently and no etiology found Not related to statins  Lab Results  Component Value Date   ALT 26 09/27/2020   ALT 25 01/19/2016      Neuropathy with persistent burning, mild numbness and lower leg pain:  She gets relief with nortriptyline and gabapentin  MUSCLE cramps/spasms:  These are treated with 10 mg baclofen during the day twice daily and 20 mg at bedtime If she has a cramp  she will take an extra dose of baclofen 10 mg Symptoms are not controlled She thinks she needs more than 10 mg 3 times daily of her baclofen, has not discussed the prescription with her PCP at  She also uses tonic water, mustard and magnesium supplements  No history of hypokalemia  Lymphedema right arm, secondary to  breast surgery, using  elastic compression stocking but not getting enough relief   History of mild hypothyroidism: Has been present for several years and minimally symptomatic usually  Synthroid dose: 88 mcg daily with extra tablet on Sundays now  She has nonspecific weakness and fatigue; this is not consistently improved with adding another tablet on Sundays TSH is which was higher than usual at 8.6 is back down to 2.3   Lab Results  Component Value Date   TSH 2.32 01/21/2021   TSH 8.65 (H) 11/23/2020   TSH 1.31 09/27/2020   FREET4 0.92 01/21/2021   FREET4 0.83 07/19/2020   FREET4 0.96 04/20/2020     Hypercholesterolemia: LDL has been at target with pravastatin Labs as below  Lipid levels:  Lab Results  Component Value Date   CHOL 189 01/21/2021   HDL 92.90 01/21/2021   LDLCALC 86 01/21/2021   TRIG 53.0 01/21/2021   CHOLHDL 2 01/21/2021   Lab Results  Component Value Date   ALT 26 09/27/2020   ALT 25 01/19/2016    No history of hypertension  BP Readings from Last 3 Encounters:  01/25/21 122/70  01/17/21 120/76  12/15/20 122/78     LABS:  Lab on 01/21/2021  Component Date Value Ref Range Status   Cholesterol 01/21/2021 189  0 - 200 mg/dL Final   ATP III Classification       Desirable:  < 200 mg/dL               Borderline High:  200 - 239 mg/dL          High:  > = 240 mg/dL    Triglycerides 01/21/2021 53.0  0.0 - 149.0 mg/dL Final   Normal:  <150 mg/dLBorderline High:  150 - 199 mg/dL   HDL 01/21/2021 92.90  >39.00 mg/dL Final   VLDL 01/21/2021 10.6  0.0 - 40.0 mg/dL Final   LDL Cholesterol 01/21/2021 86  0 - 99 mg/dL Final   Total CHOL/HDL Ratio 01/21/2021 2   Final                  Men          Women1/2 Average Risk     3.4          3.3Average Risk          5.0          4.42X Average Risk          9.6          7.13X Average Risk  15.0          11.0                       NonHDL 01/21/2021 96.25   Final   NOTE:  Non-HDL goal should be 30 mg/dL higher than patient's LDL goal (i.e. LDL goal of < 70 mg/dL, would have non-HDL goal of < 100 mg/dL)   Sodium 01/21/2021 138  135 - 145 mEq/L Final   Potassium 01/21/2021 4.8  3.5 - 5.1 mEq/L Final   Chloride 01/21/2021 100  96 - 112 mEq/L Final   CO2 01/21/2021 30  19 - 32 mEq/L Final   Glucose, Bld 01/21/2021 108 (A) 70 - 99 mg/dL Final   BUN 01/21/2021 19  6 - 23 mg/dL Final   Creatinine, Ser 01/21/2021 1.01  0.40 - 1.20 mg/dL Final   GFR 01/21/2021 55.38 (A) >60.00 mL/min Final   Calculated using the CKD-EPI Creatinine Equation (2021)   Calcium 01/21/2021 10.0  8.4 - 10.5 mg/dL Final   Free T4 01/21/2021 0.92  0.60 - 1.60 ng/dL Final   Comment: Specimens from patients who are undergoing biotin therapy and /or ingesting biotin supplements may contain high levels of biotin.  The higher biotin concentration in these specimens interferes with this Free T4 assay.  Specimens that contain high levels  of biotin may cause false high results for this Free T4 assay.  Please interpret results in light of the total clinical presentation of the patient.     TSH 01/21/2021 2.32  0.35 - 4.50 uIU/mL Final  Anti-coag visit on 12/30/2020  Component Date Value Ref Range Status   INR 12/30/2020 3.2 (A) 2.0 - 3.0 Final     EXAM:  BP 122/70   Pulse 99   Ht 5' 7.5" (1.715 m)   Wt 220 lb 12.8 oz (100.2 kg)   SpO2 99%   BMI  34.07 kg/m      Assessment/Plan:   1.  HYPOTHYROIDISM: TSH is better with additional 88 mcg of Synthroid weekly  She will switch to the 100 mcg dose when 88 mcg supply for next, this should be equivalent to current dose which is improving her TSH back to normal   2. Reactive hypoglycemia secondary to gastrointestinal surgery:   She is variably symptomatic, recently more after lunch Since she tends to have more symptoms between lunch and supper she can cut back on her carbohydrate with only 1 slice of bread and add more protein Also add another tablet of acarbose at lunch  3. Mild diabetes: A1c last 6.9 but blood sugars are consistently high except when she has more carbohydrate controlled Will continue only acarbose as above  4.   Leg edema: She we will continue diuretics as above and leg elevation  5.  Lipids well controlled on statin  Patient Instructions  2 tabs Acarbose and 2 before lunch, 1 before dinner  Elayne Snare 01/25/2021, 2:23 PM

## 2021-01-25 NOTE — Patient Instructions (Addendum)
2 tabs Acarbose and 2 before lunch, 1 before dinner

## 2021-01-26 ENCOUNTER — Ambulatory Visit (INDEPENDENT_AMBULATORY_CARE_PROVIDER_SITE_OTHER): Payer: Medicare Other | Admitting: Physician Assistant

## 2021-01-26 ENCOUNTER — Ambulatory Visit (HOSPITAL_COMMUNITY)
Admission: RE | Admit: 2021-01-26 | Discharge: 2021-01-26 | Disposition: A | Discharge status: Home / Self Care | Payer: Medicare Other | Source: Ambulatory Visit | Attending: Vascular Surgery | Admitting: Vascular Surgery

## 2021-01-26 VITALS — BP 138/75 | HR 59 | Temp 98.0°F | Resp 20 | Ht 67.5 in | Wt 220.1 lb

## 2021-01-26 DIAGNOSIS — I872 Venous insufficiency (chronic) (peripheral): Secondary | ICD-10-CM

## 2021-01-26 DIAGNOSIS — Z923 Personal history of irradiation: Secondary | ICD-10-CM | POA: Diagnosis not present

## 2021-01-26 NOTE — Progress Notes (Signed)
[facility]           [facility] [place]  History and Physical   Kelli Martinez is a 75 y.o. female who presents for evaluation of worsening bilateral lower extremity edema.   In reviewing her chart, she had a LLE DVT study in 2013, which revealed chronic non occluding deep vein thrombosis involving the popliteal and posterior veins.  Repeat DVT study in 2016 revealed chronic DVT in the left femoral, popliteal and PT veins.  Patient states she had a IVC filter placed in 2011 which has not been removed.  She is also on Coumadin and has been taking her medication as prescribed with an INR between 2 and 3 over the past several months.  She believes the bilateral lower extremity edema worsened over the past several weeks to 1 month.  She denies any prior venous ulcerations, trauma, or prior vascular intervention.  She is wearing her thigh-high compression which she purchased at a medical supply store.  She wears these daily over the past several months.  She makes an effort to elevate her legs above her heart.  Recently she was diagnosed with a pinched nerve in her back and has been unable to lay flat with her legs propped up for any length of time.  She denies tobacco use.  Patient also states she just underwent a stress test which her doctor told her "looked good."  Patient states she does a lot of walking during the day which helps her edema of bilateral lower extremities.  Past medical history also significant for right breast cancer with mastectomy.  She also received radiation and has radiation burns along her chest and right neck.  She states she has never had her right carotid artery evaluated.  She denies any TIA or CVA diagnosis.  She also denies any current stroke symptoms including slurring speech, changes in vision, or one-sided weakness.    Past Medical History:  Diagnosis Date   Allergy    Anemia    Arthritis    Asthma    Back pain, chronic    GETS  INJECTIONS IN BACK   Blind right eye    hemorrhage   Blood transfusion    Cancer ([place])    breast 1994   Clotting disorder ([place])    Diabetes mellitus without complication ([place])    takes precose   Elevated liver enzymes    Gallstones    GERD (gastroesophageal reflux disease)    HX: breast cancer    Hyperlipidemia    Hypertension    Hypothyroid    Lymphedema of arm    RT   Morbid obesity ([facility])    OSA (obstructive sleep apnea)    has not used in 2 years-lost weight   Osteoporosis    Peripheral neuropathy    on gabapentin   PONV (postoperative nausea and vomiting)    Psoriasis    Pulmonary embolism ([place]) 1998 / 1994 /1968   Syncope and collapse 2011   due to anemia    Past Surgical History:  Procedure Laterality Date   ABDOMINAL HYSTERECTOMY     BONE MARROW TRANSPLANT  1994   CARDIOVASCULAR STRESS TEST  05/23/2005   EF 53%   carpal tunnell     bil   cataracts     CHOLECYSTECTOMY  05/10/2012   Procedure: LAPAROSCOPIC CHOLECYSTECTOMY WITH INTRAOPERATIVE CHOLANGIOGRAM;  Surgeon: Pedro Earls, MD;  Location: [facility];  Service: General;  Laterality: N/A;  Laparoscopic Cholecystectomy with Intraoperative Cholangiogram   GASTRIC BYPASS  2011   bariatric surgery   HIATAL HERNIA REPAIR     IVC filter     recurrent DVT   KNEE ARTHROTOMY  1998   MASS EXCISION Right 11/24/2016   Procedure: EXCISION RIGHT AXILLARY LESION AND CHEST WALL LESION;  Surgeon: Johnathan Hausen, MD;  Location: [facility];  Service: General;  Laterality: Right;   MASTECTOMY     Right   US ECHOCARDIOGRAPHY  10/27/08   EF 55-60%    Social History   Socioeconomic History   Marital status: Widowed    Spouse name: Not on file   Number of children: 2   Years of education: Not on file   Highest education level: Not on file  Occupational History   Occupation: retired  Tobacco Use   Smoking status: Former    Packs/day: 0.50    Years: 10.00    Pack years: 5.00    Types: Cigarettes     Quit date: 07/31/1978    Years since quitting: 42.5   Smokeless tobacco: Never  Vaping Use   Vaping Use: Never used  Substance and Sexual Activity   Alcohol use: No   Drug use: No   Sexual activity: Never  Other Topics Concern   Not on file  Social History Narrative   Widowed with 2 children,  Lives in a one story home and her son and his wife recently moved in with her.  Retired Freight forwarder for [employer].  Education: some college.    Social Determinants of Health   Financial Resource Strain: Low Risk    Difficulty of Paying Living Expenses: Not hard at all  Food Insecurity: No Food Insecurity   Worried About Charity fundraiser in the Last Year: Never true   [place] in the Last Year: Never true  Transportation Needs: No Transportation Needs   Lack of Transportation (Medical): No   Lack of Transportation (Non-Medical): No  Physical Activity: Insufficiently Active   Days of Exercise per Week: 7 days   Minutes of Exercise per Session: 20 min  Stress: No Stress Concern Present   Feeling of Stress : Not at all  Social Connections: Moderately Isolated   Frequency of Communication with Friends and Family: More than three times a week   Frequency of Social Gatherings with Friends and Family: Twice a week   Attends Religious Services: More than 4 times per year   Active Member of [employer] or Organizations: No   Attends Archivist Meetings: Never   Marital Status: Widowed  Human resources officer Violence: Not At Risk   Fear of Current or Ex-Partner: No   Emotionally Abused: No   Physically Abused: No   Sexually Abused: No     Family History  Problem Relation Age of Onset   Sudden death Mother        car accident   Cancer Father 35       lung   Cancer Sister 56       lung cancer   Asthma Daughter     Current Outpatient Medications  Medication Sig Dispense Refill   acarbose (PRECOSE) 50 MG tablet 1 tablet at start of breakfast and lunch and half tablet at dinnertime 225  tablet 2   albuterol (PROAIR HFA) 108 (90 Base) MCG/ACT inhaler Inhale 2 puffs into the lungs every 6 (six) hours as needed for wheezing or shortness of breath. 24 g  2   azelastine (ASTELIN) 0.1 % nasal spray 1-2 puffs each nostril twice daily as needed 90 mL 3   baclofen (LIORESAL) 20 MG tablet Take 1 tablet (20 mg total) by mouth 3 (three) times daily. 270 tablet 0   Blood Glucose Monitoring Suppl (FREESTYLE FREEDOM LITE) w/Device KIT Use as directed to check blood sugar once daily. 1 kit 0   Calcium Carbonate-Vitamin D 600-400 MG-UNIT tablet Take 1 tablet by mouth daily.     clobetasol cream (TEMOVATE) 3.55 % APPLY 1 APPLICATION TOPICALLY TWICE A DAY 120 g 5   COVID-19 mRNA vaccine, Pfizer, 30 MCG/0.3ML injection      diazepam (DIASTAT ACUDIAL) 10 MG GEL Place 5 mg rectally once as needed (cramping). 2 Package 0   ferrous sulfate 325 (65 FE) MG tablet Take 325 mg by mouth daily.     fluticasone (FLONASE) 50 MCG/ACT nasal spray Place 2 sprays into both nostrils daily. 48 g 3   Fluticasone-Salmeterol (ADVAIR DISKUS) 100-50 MCG/DOSE AEPB Inhale 1 puff into the lungs every 12 (twelve) hours. USE 1 INHALATION EVERY 12 HOURS, RINSE MOUTH 120 each 2   FREESTYLE LITE test strip USE AS INSTRUCTED TO CHECK BLOOD SUGAR ONCE DAILY 100 strip 3   furosemide (LASIX) 20 MG tablet Take 1 tablet (20 mg total) by mouth 2 (two) times daily. 180 tablet 2   gabapentin (NEURONTIN) 600 MG tablet Take 1 tablet (600 mg total) by mouth 3 (three) times daily. 270 tablet 0   HYDROcodone-acetaminophen (NORCO) 5-325 MG tablet Take 1 tablet by mouth 2 (two) times daily as needed for moderate pain. (Patient taking differently: Take 0.5-1 tablets by mouth 3 (three) times daily as needed for moderate pain or severe pain.) 50 tablet 0   Lancets (FREESTYLE) lancets USE AS INSTRUCTED TO CHECK BLOOD SUGAR ONCE DAILY 100 each 1   levothyroxine (SYNTHROID) 100 MCG tablet Take 1 tablet (100 mcg total) by mouth daily before breakfast. 90  tablet 1   Magnesium Cl-Calcium Carbonate (SLOW-MAG PO) Take 500 mg by mouth at bedtime.      methylcellulose (ARTIFICIAL TEARS) 1 % ophthalmic solution Place 1 drop into both eyes as needed. Dry eyes     omeprazole (PRILOSEC) 20 MG capsule Take 1 capsule (20 mg total) by mouth daily. 90 capsule 3   potassium chloride SA (KLOR-CON) 20 MEQ tablet Take 1 tablet (20 mEq total) by mouth 2 (two) times daily. 180 tablet 2   pravastatin (PRAVACHOL) 10 MG tablet Take 1 tablet (10 mg total) by mouth daily. 90 tablet 3   spironolactone (ALDACTONE) 50 MG tablet Take 1 tablet (50 mg total) by mouth 2 (two) times daily. 180 tablet 0   temazepam (RESTORIL) 15 MG capsule 1-2 for sleep as needed (Patient taking differently: Take 15-30 mg by mouth at bedtime as needed for sleep. 1-2 for sleep as needed) 90 capsule 1   vitamin B-12 (CYANOCOBALAMIN) 500 MCG tablet Take 500 mcg by mouth daily.     Vitamin D, Ergocalciferol, (DRISDOL) 1.25 MG (50000 UNIT) CAPS capsule TAKE 1 CAPSULE EVERY FRIDAY 12 capsule 3   warfarin (COUMADIN) 5 MG tablet Please take 2 tablets daily except take 1 tablet on Wed or Take as directed by anticoagulation clinic 205 tablet 3   No current facility-administered medications for this visit.    Allergies  Allergen Reactions   Adhesive [Tape] Rash    REVIEW OF SYSTEMS:   [X]  denotes positive finding, [ ]  denotes negative finding Cardiac  Comments:  Chest pain or chest pressure:    Shortness of breath upon exertion:    Short of breath when lying flat:    Irregular heart rhythm:        Vascular    Pain in calf, thigh, or hip brought on by ambulation:    Pain in feet at night that wakes you up from your sleep:     Blood clot in your veins:    Leg swelling:         Pulmonary    Oxygen at home:    Productive cough:     Wheezing:         Neurologic    Sudden weakness in arms or legs:     Sudden numbness in arms or legs:     Sudden onset of difficulty speaking or slurred  speech:    Temporary loss of vision in one eye:     Problems with dizziness:         Gastrointestinal    Blood in stool:     Vomited blood:         Genitourinary    Burning when urinating:     Blood in urine:        Psychiatric    Major depression:         Hematologic    Bleeding problems:    Problems with blood clotting too easily:        Skin    Rashes or ulcers:        Constitutional    Fever or chills:      PHYSICAL EXAMINATION:    General:  WDWN in NAD; vital signs documented above Gait: Not observed HENT: WNL, normocephalic Pulmonary: normal non-labored breathing without wheezing Cardiac: regular HR Abdomen: soft, NT, no masses Skin: without rashes Vascular Exam/Pulses:  Right Left  Radial 2+ (normal) 2+ (normal)  DP doppler doppler  PT doppler doppler   Extremities: Pitting edema to the level of the knee bilaterally with edema involving her feet and toes; pigmentation changes more so on the left ankle than the right; no venous ulcerations  Neurologic: A&O X 3;  moving all extremities equally Psychiatric:  The pt has Normal affect.   Non-Invasive Vascular Imaging:   Venous duplex on 01/26/21:  Venous reflux duplex of bilateral lower extremities negative for DVT R: Superficial venous reflux noted at the GSV in the mid thigh and proximal calf Negative for deep reflux  L: Superficial venous reflux noted in the GSVat the saphenofemoral junction and the proximal calf; also noted in the small saphenous vein at the mid calf Deep reflux in the femoral vein and popliteal vein    Jameisha Stofko is a 75 y.o. female who presents with worsening edema of bilateral lower extremities in the past few weeks   Patient has noticed a change in bilateral lower extremity edema over the past several weeks.  Venous reflux study was negative for DVT bilaterally.  She does have mild deep and superficial venous reflux bilaterally however would not be a candidate  for laser ablation.  Post thrombotic syndrome likely explains why the left lower extremity is worse than the right.  She should continue thigh-high compression socks daily and regularly.  We also discussed proper leg elevation above the heart to be done periodically throughout the day.  She will continue to avoid prolonged sitting and standing.  Given her history as well as her physical exam involving edema of her feet and toes she likely  also has component of lymphedema.  I think she would be a good candidate for lymphedema pumps.  We will try to arrange this through insurance.  Also, given recent worsening in bilateral lower extremity edema and presence of IVC filter I offered the patient a CT venogram to check filter patency.  She assures me that she takes her coumadin regularly and levels are always therapeutic when checked. She is not interested in CT venogram currently but will notify our office if she changes her mind.   Patient states she has never had her carotid arteries evaluated.  We will also check a carotid duplex study given radiation burns on her right neck.   [facility] 01/26/2021 9:25 AM  Clinic MD:  Scot Dock

## 2021-01-27 ENCOUNTER — Ambulatory Visit (INDEPENDENT_AMBULATORY_CARE_PROVIDER_SITE_OTHER): Payer: Medicare Other | Admitting: General Practice

## 2021-01-27 ENCOUNTER — Other Ambulatory Visit: Payer: Self-pay

## 2021-01-27 DIAGNOSIS — Z7901 Long term (current) use of anticoagulants: Secondary | ICD-10-CM

## 2021-01-27 LAB — POCT INR: INR: 2.4 (ref 2.0–3.0)

## 2021-01-27 NOTE — Patient Instructions (Addendum)
Pre visit review using our clinic review tool, if applicable. No additional management support is needed unless otherwise documented below in the visit note.  Continue to take 2 tablets daily except 1 tablet on Wed and Saturdays.  Re-check in 4 weeks.  

## 2021-01-27 NOTE — Progress Notes (Signed)
Medical screening examination/treatment/procedure(s) were performed by non-physician practitioner and as supervising physician I was immediately available for consultation/collaboration. I agree with above. Josalynn Johndrow, MD   

## 2021-02-10 ENCOUNTER — Other Ambulatory Visit: Payer: Self-pay | Admitting: Endocrinology

## 2021-02-10 ENCOUNTER — Telehealth: Payer: Self-pay | Admitting: Family Medicine

## 2021-02-10 ENCOUNTER — Other Ambulatory Visit: Payer: Self-pay | Admitting: Internal Medicine

## 2021-02-10 NOTE — Telephone Encounter (Signed)
  The patient is needing a month supply sent to  [facility] [phone] - [place], [place] Phone:  [phone]  Fax:  [phone]      baclofen (LIORESAL) 20 MG tablet  gabapentin (NEURONTIN) 600 MG tablet  [facility]  [place], [place] Phone:  [phone]  Fax:  [phone]

## 2021-02-15 MED ORDER — GABAPENTIN 600 MG PO TABS: ORAL_TABLET | ORAL | 0 refills | Status: DC

## 2021-02-15 MED ORDER — BACLOFEN 20 MG PO TABS: ORAL_TABLET | ORAL | 0 refills | Status: DC

## 2021-02-15 NOTE — Addendum Note (Signed)
Addended by: Nathanial Millman E on: 02/15/2021 03:10 PM   Modules accepted: Orders

## 2021-02-15 NOTE — Telephone Encounter (Signed)
Sent Rx's in for 1 month to local pharmacy, PCP sent refills to mail order already.

## 2021-02-24 ENCOUNTER — Other Ambulatory Visit: Payer: Self-pay

## 2021-02-24 ENCOUNTER — Ambulatory Visit (INDEPENDENT_AMBULATORY_CARE_PROVIDER_SITE_OTHER): Payer: Medicare Other | Admitting: General Practice

## 2021-02-24 DIAGNOSIS — Z7901 Long term (current) use of anticoagulants: Secondary | ICD-10-CM

## 2021-02-24 LAB — POCT INR: INR: 1.9 — AB (ref 2.0–3.0)

## 2021-02-24 NOTE — Patient Instructions (Signed)
Pre visit review using our clinic review tool, if applicable. No additional management support is needed unless otherwise documented below in the visit note.  Take 3 tablets today (7/28) and then continue to take 2 tablets daily except 1 tablet on Wed and Saturdays.  Re-check in 4 weeks.

## 2021-02-24 NOTE — Progress Notes (Signed)
Medical screening examination/treatment/procedure(s) were performed by non-physician practitioner and as supervising physician I was immediately available for consultation/collaboration. I agree with above. Suzetta Timko, MD   

## 2021-03-04 ENCOUNTER — Other Ambulatory Visit: Payer: Self-pay | Admitting: Family Medicine

## 2021-03-14 DIAGNOSIS — L603 Nail dystrophy: Secondary | ICD-10-CM | POA: Diagnosis not present

## 2021-03-14 DIAGNOSIS — E1151 Type 2 diabetes mellitus with diabetic peripheral angiopathy without gangrene: Secondary | ICD-10-CM | POA: Diagnosis not present

## 2021-03-14 DIAGNOSIS — I739 Peripheral vascular disease, unspecified: Secondary | ICD-10-CM | POA: Diagnosis not present

## 2021-03-14 DIAGNOSIS — L84 Corns and callosities: Secondary | ICD-10-CM | POA: Diagnosis not present

## 2021-03-22 ENCOUNTER — Other Ambulatory Visit: Payer: Self-pay

## 2021-03-22 DIAGNOSIS — R252 Cramp and spasm: Secondary | ICD-10-CM

## 2021-03-22 DIAGNOSIS — I872 Venous insufficiency (chronic) (peripheral): Secondary | ICD-10-CM

## 2021-03-22 MED ORDER — POTASSIUM CHLORIDE CRYS ER 20 MEQ PO TBCR: 20.0000 meq | EXTENDED_RELEASE_TABLET | Freq: Two times a day (BID) | ORAL | 2 refills | Status: DC

## 2021-03-24 ENCOUNTER — Other Ambulatory Visit: Payer: Self-pay

## 2021-03-24 ENCOUNTER — Ambulatory Visit (INDEPENDENT_AMBULATORY_CARE_PROVIDER_SITE_OTHER): Payer: Medicare Other

## 2021-03-24 DIAGNOSIS — Z7901 Long term (current) use of anticoagulants: Secondary | ICD-10-CM

## 2021-03-24 LAB — POCT INR: INR: 2.5 (ref 2.0–3.0)

## 2021-03-24 NOTE — Progress Notes (Signed)
Medical screening examination/treatment/procedure(s) were performed by non-physician practitioner and as supervising physician I was immediately available for consultation/collaboration. I agree with above. Jamea Robicheaux, MD   

## 2021-03-24 NOTE — Patient Instructions (Addendum)
Pre visit review using our clinic review tool, if applicable. No additional management support is needed unless otherwise documented below in the visit note.  Continue to take 2 tablets daily except 1 tablet on Wed and Saturdays.  Re-check in 4 weeks.  

## 2021-04-06 DIAGNOSIS — M170 Bilateral primary osteoarthritis of knee: Secondary | ICD-10-CM | POA: Diagnosis not present

## 2021-04-14 DIAGNOSIS — Z79899 Other long term (current) drug therapy: Secondary | ICD-10-CM | POA: Diagnosis not present

## 2021-04-14 DIAGNOSIS — Z5181 Encounter for therapeutic drug level monitoring: Secondary | ICD-10-CM | POA: Diagnosis not present

## 2021-04-14 DIAGNOSIS — Z79891 Long term (current) use of opiate analgesic: Secondary | ICD-10-CM | POA: Diagnosis not present

## 2021-04-14 DIAGNOSIS — G894 Chronic pain syndrome: Secondary | ICD-10-CM | POA: Diagnosis not present

## 2021-04-18 ENCOUNTER — Telehealth: Payer: Self-pay | Admitting: Family Medicine

## 2021-04-18 ENCOUNTER — Other Ambulatory Visit: Payer: Self-pay | Admitting: Internal Medicine

## 2021-04-18 NOTE — Telephone Encounter (Signed)
PT needs a refill of their pravastatin (PRAVACHOL) 10 MG tablet called in. They are running out and have just a few left. They would like a refill called into pharmacies on file.

## 2021-04-19 NOTE — Telephone Encounter (Signed)
Spoke with pt, informed her refills should be mailed out as we sent 3 refills to the mail order pharmacy. Pt said she will call to see if they have been shipped, because she has enough to last this week. Informed her to call back on Friday to let us know if she got them, if not we may be able to send her a few pills to the local pharmacy.

## 2021-04-21 ENCOUNTER — Other Ambulatory Visit: Payer: Self-pay

## 2021-04-21 ENCOUNTER — Ambulatory Visit (INDEPENDENT_AMBULATORY_CARE_PROVIDER_SITE_OTHER): Payer: Medicare Other

## 2021-04-21 DIAGNOSIS — Z23 Encounter for immunization: Secondary | ICD-10-CM

## 2021-04-21 DIAGNOSIS — Z7901 Long term (current) use of anticoagulants: Secondary | ICD-10-CM

## 2021-04-21 LAB — POCT INR: INR: 2.1 (ref 2.0–3.0)

## 2021-04-21 NOTE — Patient Instructions (Addendum)
Pre visit review using our clinic review tool, if applicable. No additional management support is needed unless otherwise documented below in the visit note.  Continue to take 2 tablets daily except 1 tablet on Wed and Saturdays.  Re-check in 4 weeks.  

## 2021-04-25 ENCOUNTER — Other Ambulatory Visit: Payer: Medicare Other

## 2021-04-25 ENCOUNTER — Other Ambulatory Visit (INDEPENDENT_AMBULATORY_CARE_PROVIDER_SITE_OTHER): Payer: Medicare Other

## 2021-04-25 DIAGNOSIS — E1142 Type 2 diabetes mellitus with diabetic polyneuropathy: Secondary | ICD-10-CM

## 2021-04-25 DIAGNOSIS — E039 Hypothyroidism, unspecified: Secondary | ICD-10-CM

## 2021-04-25 DIAGNOSIS — E7800 Pure hypercholesterolemia, unspecified: Secondary | ICD-10-CM | POA: Diagnosis not present

## 2021-04-25 LAB — TSH: TSH: 2.83 u[IU]/mL (ref 0.35–5.50)

## 2021-04-25 LAB — COMPREHENSIVE METABOLIC PANEL
ALT: 46 U/L — ABNORMAL HIGH (ref 0–35)
AST: 32 U/L (ref 0–37)
Albumin: 4.1 g/dL (ref 3.5–5.2)
Alkaline Phosphatase: 82 U/L (ref 39–117)
BUN: 23 mg/dL (ref 6–23)
CO2: 31 mEq/L (ref 19–32)
Calcium: 9.8 mg/dL (ref 8.4–10.5)
Chloride: 100 mEq/L (ref 96–112)
Creatinine, Ser: 0.98 mg/dL (ref 0.40–1.20)
GFR: 57.31 mL/min — ABNORMAL LOW (ref >60.00)
Glucose, Bld: 105 mg/dL — ABNORMAL HIGH (ref 70–99)
Potassium: 4.2 mEq/L (ref 3.5–5.1)
Sodium: 139 mEq/L (ref 135–145)
Total Bilirubin: 0.4 mg/dL (ref 0.2–1.2)
Total Protein: 7.5 g/dL (ref 6.0–8.3)

## 2021-04-25 LAB — HEMOGLOBIN A1C: Hgb A1c MFr Bld: 6.7 % — ABNORMAL HIGH (ref 4.6–6.5)

## 2021-04-25 MED ORDER — ALBUTEROL SULFATE HFA 108 (90 BASE) MCG/ACT IN AERS: 2.0000 | INHALATION_SPRAY | Freq: Four times a day (QID) | RESPIRATORY_TRACT | 2 refills | Status: DC | PRN

## 2021-04-25 NOTE — Addendum Note (Signed)
Addended by: Dessie Coma on: 04/25/2021 10:52 AM   Modules accepted: Orders

## 2021-04-26 LAB — MICROALBUMIN / CREATININE URINE RATIO
Creatinine,U: 85.9 mg/dL
Microalb Creat Ratio: 0.8 mg/g (ref 0.0–30.0)
Microalb, Ur: <0.7 mg/dL (ref 0.0–1.9)

## 2021-04-28 ENCOUNTER — Ambulatory Visit (INDEPENDENT_AMBULATORY_CARE_PROVIDER_SITE_OTHER): Payer: Medicare Other | Admitting: Endocrinology

## 2021-04-28 ENCOUNTER — Other Ambulatory Visit: Payer: Self-pay

## 2021-04-28 ENCOUNTER — Encounter: Payer: Self-pay | Admitting: Endocrinology

## 2021-04-28 VITALS — BP 116/72 | HR 53 | Ht 68.0 in | Wt 215.8 lb

## 2021-04-28 DIAGNOSIS — E1142 Type 2 diabetes mellitus with diabetic polyneuropathy: Secondary | ICD-10-CM

## 2021-04-28 DIAGNOSIS — E7800 Pure hypercholesterolemia, unspecified: Secondary | ICD-10-CM

## 2021-04-28 DIAGNOSIS — K912 Postsurgical malabsorption, not elsewhere classified: Secondary | ICD-10-CM

## 2021-04-28 DIAGNOSIS — E063 Autoimmune thyroiditis: Secondary | ICD-10-CM | POA: Diagnosis not present

## 2021-04-28 NOTE — Patient Instructions (Signed)
Protein snack 2 hrs after breakfast

## 2021-04-28 NOTE — Progress Notes (Signed)
Patient ID: Kelli Martinez, female   DOB: 06/26/1946, 75 y.o.   MRN: [id number]    Chief complaint: Followup of various chronic problems    History of Present Illness:  Kelli Martinez is here for follow-up:  1.  Postprandial hypoglycemia related to dumping syndrome   Kelli Martinez has had postprandial hypoglycemia starting a couple of years after her gastric bypass surgery Her symptoms of blood sugars are mostly a feeling of significant weakness, some shakiness.  Kelli Martinez will use glucose tablets or juice in order to relieve the symptoms, recently Kelli Martinez thinks that 1 or 2 glucose tablets are usually adequate Kelli Martinez has been to the dietitian and has been instructed on balanced low-fat meals with enough protein consistently and restricting carbohydrates including fruits   Recent history:  Kelli Martinez has been treated with acarbose 25 mg before meals  Kelli Martinez did try verapamil previously benefit  Kelli Martinez is taking her Precose before meals, taking 2 before breakfast,2 tablet at lunch and 1 at dinnertime   See still feels that Kelli Martinez has episodes of low blood sugar with symptoms of shakiness  Kelli Martinez was told to take 2 tablets of ACARBOSE at lunchtime but Kelli Martinez is only taking 1, currently taking 2 tablets at breakfast  Kelli Martinez now says that Kelli Martinez may get more low sugar episodes late afternoon and before supper Kelli Martinez has 2 documented low blood sugars, lowest in the 40s  Kelli Martinez believes that these were related to her being late for lunch   Kelli Martinez may not always eat breakfast but if Kelli Martinez does Kelli Martinez will have yogurt Usually Kelli Martinez will go ahead and treat the symptoms of shakiness with a glucose tablet without monitoring  Metformin was stopped previously   2.  SWELLING of the legs:  Kelli Martinez has had significant problems with her legs and feet chronically Kelli Martinez has been followed by PCP Control is variable with taking Aldactone 50 mg twice daily  Also taking 20 mg Lasix daily; gets cramping with higher doses  Usually Kelli Martinez does not like to  wear compression stockings because of discomfort and difficulty putting them on  Renal function and potassium are consistently normal  Lab Results  Component Value Date   K 4.2 04/25/2021   Lab Results  Component Value Date   CREATININE 0.98 04/25/2021   CREATININE 1.01 01/21/2021   CREATININE 0.89 12/15/2020     PROBLEM 3:  DIABETES:  This has been mild and well controlled since her gastric bypass surgery  The A1c usually higher than expected, last 6.9 and now 6.7  Current management, blood sugar patterns: Kelli Martinez has only been on acarbose for treatment  Blood sugar readings are excellent at home, recently not high but generally not monitoring much and usually not after meals  Again fasting readings are not consistently above 100 Lab glucose fasting was 105 Kelli Martinez usually tries to do some chair exercises or walking   Kelli Martinez was previously told to stop her Metformin  because of tendency to hypoglycemia symptoms  Glucose readings from monitor review:    PRE-MEAL Fasting Lunch Dinner Bedtime Overall  Glucose range: 87-103      Mean/median:     88   POST-MEAL PC Breakfast PC Lunch PC Dinner  Glucose range:     Mean/median:       Prior  PRE-MEAL Fasting Lunch Dinner Bedtime Overall  Glucose range: 89-116 109-178 65-105    Mean/median:     102   POST-MEAL PC Breakfast PC Lunch PC Dinner  Glucose range:  172  Mean/median:          Wt Readings from Last 3 Encounters:  04/28/21 215 lb 12.8 oz (97.9 kg)  01/26/21 220 lb 1.6 oz (99.8 kg)  01/25/21 220 lb 12.8 oz (100.2 kg)    Lab Results  Component Value Date   HGBA1C 6.7 (H) 04/25/2021   HGBA1C 6.9 (H) 11/23/2020   HGBA1C 6.5 09/27/2020   Lab Results  Component Value Date   MICROALBUR <0.7 04/26/2021   LDLCALC 86 01/21/2021   CREATININE 0.98 04/25/2021      Other active problems: see review of systems     Allergies as of 04/28/2021       Reactions   Adhesive [tape] Rash        Medication List         Accurate as of April 28, 2021 11:52 AM. If you have any questions, ask your nurse or doctor.          acarbose 50 MG tablet Commonly known as: PRECOSE 1 tablet at start of breakfast and lunch and half tablet at dinnertime   albuterol 108 (90 Base) MCG/ACT inhaler Commonly known as: ProAir HFA Inhale 2 puffs into the lungs every 6 (six) hours as needed for wheezing or shortness of breath.   azelastine 0.1 % nasal spray Commonly known as: ASTELIN USE 1 TO 2 SPRAYS IN EACH NOSTRIL TWICE A DAY AS NEEDED   baclofen 20 MG tablet Commonly known as: LIORESAL TAKE 1 TABLET THREE TIMES A DAY (UPDATED PRESCRIPTION)   Calcium Carbonate-Vitamin D 600-400 MG-UNIT tablet Take 1 tablet by mouth daily.   clobetasol cream 0.05 % Commonly known as: TEMOVATE APPLY 1 APPLICATION TOPICALLY TWICE A DAY   diazepam 10 MG Gel Commonly known as: DIASTAT ACUDIAL Place 5 mg rectally once as needed (cramping).   ferrous sulfate 325 (65 FE) MG tablet Take 325 mg by mouth daily.   fluticasone 50 MCG/ACT nasal spray Commonly known as: FLONASE USE 2 SPRAYS IN EACH NOSTRIL DAILY   Fluticasone-Salmeterol 100-50 MCG/DOSE Aepb Commonly known as: Advair Diskus Inhale 1 puff into the lungs every 12 (twelve) hours. USE 1 INHALATION EVERY 12 HOURS, RINSE MOUTH   FreeStyle Freedom Lite w/Device Kit Use as directed to check blood sugar once daily.   freestyle lancets USE AS INSTRUCTED TO CHECK BLOOD SUGAR ONCE DAILY   FREESTYLE LITE test strip Generic drug: glucose blood USE AS INSTRUCTED TO CHECK BLOOD SUGAR ONCE DAILY   furosemide 20 MG tablet Commonly known as: LASIX Take 1 tablet (20 mg total) by mouth 2 (two) times daily.   gabapentin 600 MG tablet Commonly known as: NEURONTIN TAKE 1 TABLET THREE TIMES A DAY (DOSE CHANGE)   HYDROcodone-acetaminophen 5-325 MG tablet Commonly known as: Norco Take 1 tablet by mouth 2 (two) times daily as needed for moderate pain. What changed:   how much to take when to take this reasons to take this   levothyroxine 100 MCG tablet Commonly known as: Synthroid Take 1 tablet (100 mcg total) by mouth daily before breakfast.   metFORMIN 500 MG tablet Commonly known as: GLUCOPHAGE   methylcellulose 1 % ophthalmic solution Commonly known as: ARTIFICIAL TEARS Place 1 drop into both eyes as needed. Dry eyes   omeprazole 20 MG capsule Commonly known as: PRILOSEC Take 1 capsule (20 mg total) by mouth daily.   potassium chloride SA 20 MEQ tablet Commonly known as: KLOR-CON Take 1 tablet (20 mEq total) by mouth 2 (two) times daily.  pravastatin 10 MG tablet Commonly known as: PRAVACHOL Take 1 tablet (10 mg total) by mouth daily.   SLOW-MAG PO Take 500 mg by mouth at bedtime.   spironolactone 50 MG tablet Commonly known as: ALDACTONE TAKE 1 TABLET TWICE A DAY   temazepam 15 MG capsule Commonly known as: RESTORIL 1-2 for sleep as needed What changed:  how much to take how to take this when to take this reasons to take this   vitamin B-12 500 MCG tablet Commonly known as: CYANOCOBALAMIN Take 500 mcg by mouth daily.   Vitamin D (Ergocalciferol) 1.25 MG (50000 UNIT) Caps capsule Commonly known as: DRISDOL TAKE 1 CAPSULE EVERY FRIDAY   warfarin 5 MG tablet Commonly known as: COUMADIN Take as directed by the anticoagulation clinic. If you are unsure how to take this medication, talk to your nurse or doctor. Original instructions: Please take 2 tablets daily except take 1 tablet on Wed or Take as directed by anticoagulation clinic        Allergies:  Allergies  Allergen Reactions   Adhesive [Tape] Rash    Past Medical History:  Diagnosis Date   Allergy    Anemia    Arthritis    Asthma    Back pain, chronic    GETS INJECTIONS IN BACK   Blind right eye    hemorrhage   Blood transfusion    Cancer ([place])    breast 1994   Clotting disorder ([place])    Diabetes mellitus without complication ([place])    takes  precose   Elevated liver enzymes    Gallstones    GERD (gastroesophageal reflux disease)    HX: breast cancer    Hyperlipidemia    Hypertension    Hypothyroid    Lymphedema of arm    RT   Morbid obesity ([facility])    OSA (obstructive sleep apnea)    has not used in 2 years-lost weight   Osteoporosis    Peripheral neuropathy    on gabapentin   PONV (postoperative nausea and vomiting)    Psoriasis    Pulmonary embolism ([place]) 1998 / 1994 /1968   Syncope and collapse 2011   due to anemia    Past Surgical History:  Procedure Laterality Date   ABDOMINAL HYSTERECTOMY     BONE MARROW TRANSPLANT  1994   CARDIOVASCULAR STRESS TEST  05/23/2005   EF 53%   carpal tunnell     bil   cataracts     CHOLECYSTECTOMY  05/10/2012   Procedure: LAPAROSCOPIC CHOLECYSTECTOMY WITH INTRAOPERATIVE CHOLANGIOGRAM;  Surgeon: Pedro Earls, MD;  Location: [facility];  Service: General;  Laterality: N/A;  Laparoscopic Cholecystectomy with Intraoperative Cholangiogram   GASTRIC BYPASS  2011   bariatric surgery   HIATAL HERNIA REPAIR     IVC filter     recurrent DVT   KNEE ARTHROTOMY  1998   MASS EXCISION Right 11/24/2016   Procedure: EXCISION RIGHT AXILLARY LESION AND CHEST WALL LESION;  Surgeon: Johnathan Hausen, MD;  Location: [facility];  Service: General;  Laterality: Right;   MASTECTOMY     Right   US ECHOCARDIOGRAPHY  10/27/08   EF 55-60%    Family History  Problem Relation Age of Onset   Sudden death Mother        car accident   Cancer Father 50       lung   Cancer Sister 34       lung cancer   Asthma Daughter  Social History:  reports that Kelli Martinez quit smoking about 42 years ago. Her smoking use included cigarettes. Kelli Martinez has a 5.00 pack-year smoking history. Kelli Martinez has never used smokeless tobacco. Kelli Martinez reports that Kelli Martinez does not drink alcohol and does not use drugs.  Review of Systems    ABNORMAL liver functions:  ALT is high again but only slightly  Kelli Martinez has had abnormal high  ALT readings before transiently and no etiology found Not related to statins  Lab Results  Component Value Date   ALT 46 (H) 04/25/2021   ALT 25 01/19/2016     Neuropathy with persistent burning, mild numbness and lower leg pain:  Kelli Martinez gets relief with nortriptyline and gabapentin  MUSCLE cramps/spasms:  These are treated with 10 mg baclofen during the day twice daily and 20 mg at bedtime If Kelli Martinez has a cramp  Kelli Martinez will take an extra dose of baclofen 10 mg Followed by PCP  Kelli Martinez also uses tonic water, mustard and magnesium supplements  No history of hypokalemia  Lymphedema right arm, secondary to  breast surgery, using  elastic compression stocking but not getting enough relief   History of mild hypothyroidism: Has been present for several years and minimally symptomatic usually  Current dose of levothyroxine is 100 mcg  Kelli Martinez has nonspecific weakness and fatigue which is not different TSH history:   Lab Results  Component Value Date   TSH 2.83 04/25/2021   TSH 2.32 01/21/2021   TSH 8.65 (H) 11/23/2020   FREET4 0.92 01/21/2021   FREET4 0.83 07/19/2020   FREET4 0.96 04/20/2020     Hypercholesterolemia: LDL has been at target with pravastatin 47m  Labs as below  Lipid levels:  Lab Results  Component Value Date   CHOL 189 01/21/2021   HDL 92.90 01/21/2021   LDLCALC 86 01/21/2021   TRIG 53.0 01/21/2021   CHOLHDL 2 01/21/2021   Lab Results  Component Value Date   ALT 46 (H) 04/25/2021   ALT 25 01/19/2016    No history of hypertension  BP Readings from Last 3 Encounters:  04/28/21 116/72  01/26/21 138/75  01/25/21 122/70     LABS:  Lab on 04/25/2021  Component Date Value Ref Range Status   TSH 04/25/2021 2.83  0.35 - 5.50 uIU/mL Final   Sodium 04/25/2021 139  135 - 145 mEq/L Final   Potassium 04/25/2021 4.2  3.5 - 5.1 mEq/L Final   Chloride 04/25/2021 100  96 - 112 mEq/L Final   CO2 04/25/2021 31  19 - 32 mEq/L Final   Glucose, Bld 04/25/2021 105  (A) 70 - 99 mg/dL Final   BUN 04/25/2021 23  6 - 23 mg/dL Final   Creatinine, Ser 04/25/2021 0.98  0.40 - 1.20 mg/dL Final   Total Bilirubin 04/25/2021 0.4  0.2 - 1.2 mg/dL Final   Alkaline Phosphatase 04/25/2021 82  39 - 117 U/L Final   AST 04/25/2021 32  0 - 37 U/L Final   ALT 04/25/2021 46 (A) 0 - 35 U/L Final   Total Protein 04/25/2021 7.5  6.0 - 8.3 g/dL Final   Albumin 04/25/2021 4.1  3.5 - 5.2 g/dL Final   GFR 04/25/2021 57.31 (A) >60.00 mL/min Final   Calculated using the CKD-EPI Creatinine Equation (2021)   Calcium 04/25/2021 9.8  8.4 - 10.5 mg/dL Final   Hgb A1c MFr Bld 04/25/2021 6.7 (A) 4.6 - 6.5 % Final   Glycemic Control Guidelines for People with Diabetes:Non Diabetic:  <6%Goal of Therapy: <7%Additional Action Suggested:  >  8%    Microalb, Ur 04/26/2021 <0.7  0.0 - 1.9 mg/dL Final   Creatinine,U 04/26/2021 85.9  mg/dL Final   Microalb Creat Ratio 04/26/2021 0.8  0.0 - 30.0 mg/g Final  Anti-coag visit on 04/21/2021  Component Date Value Ref Range Status   INR 04/21/2021 2.1  2.0 - 3.0 Final     EXAM:  BP 116/72   Pulse (!) 53   Ht 5' 8"  (1.727 m)   Wt 215 lb 12.8 oz (97.9 kg)   SpO2 99%   BMI 32.81 kg/m      Assessment/Plan:   1.  HYPOTHYROIDISM: TSH is better with additional 88 mcg of Synthroid weekly  Kelli Martinez will switch to the 100 mcg dose when 88 mcg supply for next, this should be equivalent to current dose which is improving her TSH back to normal   2. Reactive hypoglycemia secondary to gastrointestinal surgery:   Kelli Martinez is still occasionally symptomatic, recently more when Kelli Martinez is late for lunch Since Kelli Martinez tends to have more symptoms between breakfast and lunch Kelli Martinez needs to have a protein snack about 2 hours after breakfast and also try not to delay her lunch Continue acarbose 5 tablets daily as above  Kelli Martinez needs to make sure that Kelli Martinez taking the acarbose at the start of the meal/  3. Mild diabetes: A1c better at 6.7 As before blood sugars are generally  fairly good  Kelli Martinez has lost weight and is usually keeping portions small  4.   Hypothyroidism: Consistently controlled now with 100 mcg levothyroxine  5.  Increased ALT again: This is usually transient as before and will continue to monitor  Patient Instructions  Protein snack 2 hrs after breakfast   Elayne Snare 04/28/2021, 11:52 AM

## 2021-05-03 ENCOUNTER — Other Ambulatory Visit: Payer: Self-pay

## 2021-05-03 DIAGNOSIS — E063 Autoimmune thyroiditis: Secondary | ICD-10-CM

## 2021-05-03 MED ORDER — LEVOTHYROXINE SODIUM 100 MCG PO TABS: 100.0000 ug | ORAL_TABLET | Freq: Every day | ORAL | 1 refills | Status: DC

## 2021-05-04 ENCOUNTER — Other Ambulatory Visit: Payer: Self-pay | Admitting: Endocrinology

## 2021-05-11 ENCOUNTER — Other Ambulatory Visit: Payer: Self-pay | Admitting: Internal Medicine

## 2021-05-19 ENCOUNTER — Other Ambulatory Visit: Payer: Self-pay

## 2021-05-19 ENCOUNTER — Ambulatory Visit (INDEPENDENT_AMBULATORY_CARE_PROVIDER_SITE_OTHER): Payer: Medicare Other

## 2021-05-19 DIAGNOSIS — Z7901 Long term (current) use of anticoagulants: Secondary | ICD-10-CM

## 2021-05-19 LAB — POCT INR: INR: 2.9 (ref 2.0–3.0)

## 2021-05-19 NOTE — Patient Instructions (Addendum)
Pre visit review using our clinic review tool, if applicable. No additional management support is needed unless otherwise documented below in the visit note.  Continue to take 2 tablets daily except 1 tablet on Wed and Saturdays.  Re-check in 4 weeks.  

## 2021-06-07 NOTE — Progress Notes (Signed)
Continue to take 2 tablets daily except 1 tablet on Wed and Saturdays.  Re-check in 4 weeks.  Resending note to provider for signature.

## 2021-06-15 DIAGNOSIS — L603 Nail dystrophy: Secondary | ICD-10-CM | POA: Diagnosis not present

## 2021-06-15 DIAGNOSIS — E1151 Type 2 diabetes mellitus with diabetic peripheral angiopathy without gangrene: Secondary | ICD-10-CM | POA: Diagnosis not present

## 2021-06-15 DIAGNOSIS — L84 Corns and callosities: Secondary | ICD-10-CM | POA: Diagnosis not present

## 2021-06-15 DIAGNOSIS — I739 Peripheral vascular disease, unspecified: Secondary | ICD-10-CM | POA: Diagnosis not present

## 2021-06-16 ENCOUNTER — Other Ambulatory Visit: Payer: Self-pay

## 2021-06-16 ENCOUNTER — Ambulatory Visit (INDEPENDENT_AMBULATORY_CARE_PROVIDER_SITE_OTHER): Payer: Medicare Other

## 2021-06-16 DIAGNOSIS — Z7901 Long term (current) use of anticoagulants: Secondary | ICD-10-CM | POA: Diagnosis not present

## 2021-06-16 LAB — POCT INR: INR: 2.5 (ref 2.0–3.0)

## 2021-06-16 NOTE — Patient Instructions (Addendum)
Pre visit review using our clinic review tool, if applicable. No additional management support is needed unless otherwise documented below in the visit note.  Continue to take 2 tablets daily except 1 tablet on Wed and Saturdays.  Re-check in 4 weeks.  

## 2021-06-16 NOTE — Progress Notes (Signed)
Continue to take 2 tablets daily except 1 tablet on Wed and Saturdays.  Re-check in 4 weeks.  

## 2021-06-27 ENCOUNTER — Telehealth: Payer: Self-pay | Admitting: Family Medicine

## 2021-06-27 NOTE — Telephone Encounter (Signed)
Pt is calling and dr Dwyane Dee no longer will prescribe Vitamin D, Ergocalciferol, (DRISDOL) 1.25 MG (50000 UNIT) CAPS capsule . Pt states dr banks handles all her medications [facility] [place], [place] Phone:  [phone]  Fax:  [phone]

## 2021-06-30 NOTE — Telephone Encounter (Signed)
Patient should have her vitamin D level rechecked to see if she still requires that dose of ergocalciferol.

## 2021-07-01 NOTE — Telephone Encounter (Signed)
Spoke with pt, informed her of having levels checked, she stated she wants to check with insurance first, due to having bills from some previous labs. Informed her that is fine, and after she checks, she can call back to schedule the lab if she wants to get it checked.

## 2021-07-05 ENCOUNTER — Other Ambulatory Visit: Payer: Self-pay | Admitting: Endocrinology

## 2021-07-08 ENCOUNTER — Encounter: Payer: Self-pay | Admitting: Family Medicine

## 2021-07-08 ENCOUNTER — Ambulatory Visit (INDEPENDENT_AMBULATORY_CARE_PROVIDER_SITE_OTHER): Payer: Medicare Other | Admitting: Family Medicine

## 2021-07-08 ENCOUNTER — Ambulatory Visit (INDEPENDENT_AMBULATORY_CARE_PROVIDER_SITE_OTHER): Payer: Medicare Other

## 2021-07-08 ENCOUNTER — Other Ambulatory Visit: Payer: Self-pay

## 2021-07-08 VITALS — BP 122/68 | HR 60 | Temp 98.4°F | Wt 221.0 lb

## 2021-07-08 DIAGNOSIS — R600 Localized edema: Secondary | ICD-10-CM

## 2021-07-08 DIAGNOSIS — R202 Paresthesia of skin: Secondary | ICD-10-CM | POA: Diagnosis not present

## 2021-07-08 DIAGNOSIS — M62838 Other muscle spasm: Secondary | ICD-10-CM

## 2021-07-08 DIAGNOSIS — R002 Palpitations: Secondary | ICD-10-CM | POA: Diagnosis not present

## 2021-07-08 DIAGNOSIS — R2689 Other abnormalities of gait and mobility: Secondary | ICD-10-CM | POA: Diagnosis not present

## 2021-07-08 DIAGNOSIS — I440 Atrioventricular block, first degree: Secondary | ICD-10-CM

## 2021-07-08 DIAGNOSIS — H539 Unspecified visual disturbance: Secondary | ICD-10-CM | POA: Diagnosis not present

## 2021-07-08 DIAGNOSIS — R0602 Shortness of breath: Secondary | ICD-10-CM

## 2021-07-08 DIAGNOSIS — E039 Hypothyroidism, unspecified: Secondary | ICD-10-CM | POA: Diagnosis not present

## 2021-07-08 DIAGNOSIS — R413 Other amnesia: Secondary | ICD-10-CM | POA: Diagnosis not present

## 2021-07-08 DIAGNOSIS — Z7901 Long term (current) use of anticoagulants: Secondary | ICD-10-CM

## 2021-07-08 DIAGNOSIS — E559 Vitamin D deficiency, unspecified: Secondary | ICD-10-CM | POA: Diagnosis not present

## 2021-07-08 LAB — CBC WITH DIFFERENTIAL/PLATELET
Basophils Absolute: 0.1 10*3/uL (ref 0.0–0.1)
Basophils Relative: 1.4 % (ref 0.0–3.0)
Eosinophils Absolute: 0 10*3/uL (ref 0.0–0.7)
Eosinophils Relative: 0.8 % (ref 0.0–5.0)
HCT: 37.4 % (ref 36.0–46.0)
Hemoglobin: 12.1 g/dL (ref 12.0–15.0)
Lymphocytes Relative: 18.1 % (ref 12.0–46.0)
Lymphs Abs: 0.7 10*3/uL (ref 0.7–4.0)
MCHC: 32.3 g/dL (ref 30.0–36.0)
MCV: 86.9 fl (ref 78.0–100.0)
Monocytes Absolute: 0.2 10*3/uL (ref 0.1–1.0)
Monocytes Relative: 4.3 % (ref 3.0–12.0)
Neutro Abs: 3.1 10*3/uL (ref 1.4–7.7)
Neutrophils Relative %: 75.4 % (ref 43.0–77.0)
Platelets: 170 10*3/uL (ref 150.0–400.0)
RBC: 4.31 Mil/uL (ref 3.87–5.11)
RDW: 15 % (ref 11.5–15.5)
WBC: 4.2 10*3/uL (ref 4.0–10.5)

## 2021-07-08 LAB — T4, FREE: Free T4: 1.05 ng/dL (ref 0.60–1.60)

## 2021-07-08 LAB — LIPID PANEL
Cholesterol: 200 mg/dL (ref 0–200)
HDL: 102.9 mg/dL (ref >39.00)
LDL Cholesterol: 88 mg/dL (ref 0–99)
NonHDL: 97.51
Total CHOL/HDL Ratio: 2
Triglycerides: 50 mg/dL (ref 0.0–149.0)
VLDL: 10 mg/dL (ref 0.0–40.0)

## 2021-07-08 LAB — COMPREHENSIVE METABOLIC PANEL
ALT: 31 U/L (ref 0–35)
AST: 26 U/L (ref 0–37)
Albumin: 4.2 g/dL (ref 3.5–5.2)
Alkaline Phosphatase: 85 U/L (ref 39–117)
BUN: 13 mg/dL (ref 6–23)
CO2: 30 mEq/L (ref 19–32)
Calcium: 10.1 mg/dL (ref 8.4–10.5)
Chloride: 101 mEq/L (ref 96–112)
Creatinine, Ser: 0.85 mg/dL (ref 0.40–1.20)
GFR: 67.89 mL/min (ref >60.00)
Glucose, Bld: 107 mg/dL — ABNORMAL HIGH (ref 70–99)
Potassium: 4.1 mEq/L (ref 3.5–5.1)
Sodium: 139 mEq/L (ref 135–145)
Total Bilirubin: 0.4 mg/dL (ref 0.2–1.2)
Total Protein: 7.5 g/dL (ref 6.0–8.3)

## 2021-07-08 LAB — PROTIME-INR
INR: 2.1 ratio — ABNORMAL HIGH (ref 0.8–1.0)
Prothrombin Time: 21.9 s — ABNORMAL HIGH (ref 9.6–13.1)

## 2021-07-08 LAB — VITAMIN D 25 HYDROXY (VIT D DEFICIENCY, FRACTURES): VITD: 54.94 ng/mL (ref 30.00–100.00)

## 2021-07-08 LAB — VITAMIN B12: Vitamin B-12: 1054 pg/mL — ABNORMAL HIGH (ref 211–911)

## 2021-07-08 LAB — BRAIN NATRIURETIC PEPTIDE: Pro B Natriuretic peptide (BNP): 223 pg/mL — ABNORMAL HIGH (ref 0.0–100.0)

## 2021-07-08 LAB — FOLATE: Folate: 13 ng/mL (ref >5.9)

## 2021-07-08 LAB — TSH: TSH: 1.9 u[IU]/mL (ref 0.35–5.50)

## 2021-07-08 NOTE — Progress Notes (Signed)
Subjective:    Patient ID: Kelli Martinez, female    DOB: 1946-04-15, 75 y.o.   MRN: [id number]  Chief Complaint  Patient presents with   Follow-up    Vit D check    HPI Patient was seen today for f/u.  Pt requested refill on vit D.  Vitamin D was 62.25 on 09/27/2020.  Since last OFV patient states she has been feeling bad.  Endorses bilateral LE edema, 10 pound weight gain since appointment on Wednesday.  Taking Lasix 20 mg at night.  Patient endorses several falls at home as her back gave out while walking.  States at times feels like she is staggering/stumbling when her back is bothering her.  Uses a cane or walker to aid with ambulation.  May take a half a tab of hydrocodone if the pain is really bad.  Has an appointment with Dr. Nelva Martinez next week to further evaluate.  Patient also notes numbness in the legs which will cause them to travel.  Still having cramping/muscle spasms in extremities and face.  Spasms in face causing her to "twist" with symptoms resolving upon rubbing face.  Patient states LE edema increased as she drank pickle juice and had a pickle last night for muscle spasm.  Pt endorses an episode last week with her heart racing, shortness of breath, faint feeling, and loss of vision.  During episode patient sat down in her recliner, drinking water, and kept blinking with symptoms finally resolving.  Patient has a history of blindness in the right 2/2 hemorrhage.  Patient also notes recent confusion keeping up with appointments.  No longer likes to drive.  Plans to sell her home and move in with her daughter.  patient states blood sugar has been good.  Taking acarbose 100 mg in a.m., 100 mg at lunch, and 50 mg with dinner.  Has appointment with Dr. Tempie Martinez, ophthalmology January 2023 and Dr. Keturah Martinez, pulmonology in February 2023.  Past Medical History:  Diagnosis Date   Allergy    Anemia    Arthritis    Asthma    Back pain, chronic    GETS INJECTIONS IN BACK    Blind right eye    hemorrhage   Blood transfusion    Cancer ([place])    breast 1994   Clotting disorder ([place])    Diabetes mellitus without complication ([place])    takes precose   Elevated liver enzymes    Gallstones    GERD (gastroesophageal reflux disease)    HX: breast cancer    Hyperlipidemia    Hypertension    Hypothyroid    Lymphedema of arm    RT   Morbid obesity ([facility])    OSA (obstructive sleep apnea)    has not used in 2 years-lost weight   Osteoporosis    Peripheral neuropathy    on gabapentin   PONV (postoperative nausea and vomiting)    Psoriasis    Pulmonary embolism ([place]) 1998 / 1994 /1968   Syncope and collapse 2011   due to anemia    Allergies  Allergen Reactions   Adhesive [Tape] Rash    ROS General: Denies fever, chills, night sweats, changes in weight, changes in appetite  + weight gain, balance issues/frequent falls HEENT: Denies headaches, ear pain, changes in vision, rhinorrhea, sore throat  + changes in vision CV: Denies CP, orthopnea  + palpitations, SOB Pulm: Denies cough, wheezing + SOB GI: Denies abdominal pain, nausea, vomiting, diarrhea, constipation GU: Denies dysuria, hematuria,  frequency, vaginal discharge Msk: Denies joint pains + low back pain, muscle cramps Neuro: Denies tingling  + numbness in bilateral LEs, weakness and back/LEs. Skin: Denies rashes, bruising Psych: Denies depression, anxiety, hallucinations     Objective:    Blood pressure 122/68, pulse 60, temperature 98.4 F (36.9 C), temperature source Oral, weight 221 lb (100.2 kg), SpO2 96 %.  Gen. Pleasant, well-nourished, in no distress, normal affect   HEENT: [name]/AT, face symmetric, conjunctiva clear, no scleral icterus, PERRLA, EOMI, nares patent without drainage, pharynx without erythema or exudate. Neck: No JVD, no thyromegaly, no carotid bruits. Lungs: no accessory muscle use, CTAB, no wheezes or rales Cardiovascular: RRR, no m/r/g, 2+ pitting edema in RLE and trace  in LLE.  No calf tenderness. Abdomen: BS present, soft, NT/ND, no hepatosplenomegaly. Musculoskeletal: No deformities, no cyanosis or clubbing, normal tone Neuro:  A&Ox3, CN II-XII intact, slow gait with cane Skin:  Warm, no lesions/ rash.  Lymphedema of RUE.   Wt Readings from Last 3 Encounters:  07/08/21 221 lb (100.2 kg)  04/28/21 215 lb 12.8 oz (97.9 kg)  01/26/21 220 lb 1.6 oz (99.8 kg)    Lab Results  Component Value Date   WBC 3.8 (L) 09/27/2020   HGB 12.2 09/27/2020   HCT 37.3 09/27/2020   PLT 161.0 09/27/2020   GLUCOSE 105 (H) 04/25/2021   CHOL 189 01/21/2021   TRIG 53.0 01/21/2021   HDL 92.90 01/21/2021   LDLCALC 86 01/21/2021   ALT 46 (H) 04/25/2021   AST 32 04/25/2021   NA 139 04/25/2021   K 4.2 04/25/2021   CL 100 04/25/2021   CREATININE 0.98 04/25/2021   BUN 23 04/25/2021   CO2 31 04/25/2021   TSH 2.83 04/25/2021   INR 2.5 06/16/2021   HGBA1C 6.7 (H) 04/25/2021   MICROALBUR <0.7 04/26/2021    Assessment/Plan:  Vitamin D deficiency -Previously on prescribed ergocalciferol -Recheck vitamin D level.  If needed new prescription to be sent to pharmacy. -Given handout  - Plan: Vitamin D, 25-hydroxy  Palpitations -EKG this visit with NSR, first-degree AV block PR interval .24, VR approximately 60, normal axis, nonspecific T wave inversion in lead III, V2, V3.  Cannot rule out ischemia.  Possible LA abnormality 2/2 negative Tramel component of P wave in V1.  Studies from 02/23/2017, 02/20/2016 reviewed for comparison with first-degree AV block. - Plan: EKG 12-Lead, CT HEAD WO CONTRAST (5MM), CBC with Differential/Platelet, TSH, T4, Free, DG Chest 2 View  Transient vision disturbance of both eyes  -Continue pravastatin 10 mg daily -Continue Coumadin - Plan: CT HEAD WO CONTRAST (5MM), Lipid panel, Protime-INR  SOB (shortness of breath) -Concern for CHF -Given strict precautions - Plan: Brain Natriuretic Peptide, D-dimer, Quantitative, DG Chest 2 View,  echo  Bilateral lower extremity edema  -concern for CHF though has history of venous insufficiency -obtain labs -further recommendations based on labs. -Continue Lasix daily -Given strict precautions - Plan: CMP, Brain Natriuretic Peptide, DG Chest 2 View, echo  Memory deficit -Concern for TIA -We will obtain labs -Obtain imaging brain -Continue statin and Coumadin -Patient given strict precautions  - Plan: CT HEAD WO CONTRAST (5MM)  Paresthesias - Plan: Vitamin B12, Folate  Muscle spasm -Chronic -Possibly medication related - Plan: CMP, Vitamin B12, Folate  Hypothyroidism, unspecified type  - Plan: Lipid panel, TSH, free T4  Balance problem -Place referral for PT HH -Plan: CT head without contrast  First-degree AV block -Stable -Noted on previous EKGs from 2017 and 2018 -Plan:  Refer to cardiology, echo  Chronic anticoagulation -History of PE -Continue current dose of Coumadin  -follow-up with Coumadin clinic -Plan: INR  F/u as needed in 1-2 weeks  More than 50% of over 55 minutes spent in total in caring for this patient face-to-face, counseling and/or coordinating care.   Grier Mitts, MD

## 2021-07-09 LAB — D-DIMER, QUANTITATIVE: D-Dimer, Quant: 0.42 mcg/mL FEU (ref <0.50)

## 2021-07-11 ENCOUNTER — Ambulatory Visit (INDEPENDENT_AMBULATORY_CARE_PROVIDER_SITE_OTHER): Payer: Medicare Other

## 2021-07-11 LAB — POCT INR: INR: 2.1 (ref 2.0–3.0)

## 2021-07-11 NOTE — Patient Instructions (Addendum)
Pre visit review using our clinic review tool, if applicable. No additional management support is needed unless otherwise documented below in the visit note.  Continue to take 2 tablets daily except 1 tablet on Wed and Saturdays.  Re-check in 4 weeks.  

## 2021-07-11 NOTE — Progress Notes (Signed)
Continue to take 2 tablets daily except 1 tablet on Wed and Saturdays.  Re-check in 4 weeks.  

## 2021-07-12 ENCOUNTER — Ambulatory Visit (HOSPITAL_BASED_OUTPATIENT_CLINIC_OR_DEPARTMENT_OTHER)
Admission: RE | Admit: 2021-07-12 | Discharge: 2021-07-12 | Disposition: A | Discharge status: Home / Self Care | Payer: Medicare Other | Source: Ambulatory Visit | Attending: Family Medicine | Admitting: Family Medicine

## 2021-07-12 ENCOUNTER — Other Ambulatory Visit: Payer: Self-pay

## 2021-07-12 DIAGNOSIS — R413 Other amnesia: Secondary | ICD-10-CM | POA: Diagnosis not present

## 2021-07-12 DIAGNOSIS — H539 Unspecified visual disturbance: Secondary | ICD-10-CM | POA: Insufficient documentation

## 2021-07-12 DIAGNOSIS — R410 Disorientation, unspecified: Secondary | ICD-10-CM | POA: Diagnosis not present

## 2021-07-12 DIAGNOSIS — R002 Palpitations: Secondary | ICD-10-CM | POA: Diagnosis not present

## 2021-07-14 ENCOUNTER — Ambulatory Visit: Payer: Medicare Other

## 2021-07-14 DIAGNOSIS — M4316 Spondylolisthesis, lumbar region: Secondary | ICD-10-CM | POA: Diagnosis not present

## 2021-07-14 DIAGNOSIS — M5030 Other cervical disc degeneration, unspecified cervical region: Secondary | ICD-10-CM | POA: Diagnosis not present

## 2021-07-14 DIAGNOSIS — M5136 Other intervertebral disc degeneration, lumbar region: Secondary | ICD-10-CM | POA: Diagnosis not present

## 2021-07-14 DIAGNOSIS — M5416 Radiculopathy, lumbar region: Secondary | ICD-10-CM | POA: Diagnosis not present

## 2021-07-15 ENCOUNTER — Telehealth: Payer: Self-pay | Admitting: Family Medicine

## 2021-07-15 NOTE — Telephone Encounter (Signed)
Pt is calling and would like head ct scan result

## 2021-07-15 NOTE — Telephone Encounter (Signed)
Pt is aware of results. 

## 2021-07-18 ENCOUNTER — Encounter (HOSPITAL_COMMUNITY): Payer: Self-pay | Admitting: Family Medicine

## 2021-08-01 ENCOUNTER — Other Ambulatory Visit: Payer: Self-pay | Admitting: Endocrinology

## 2021-08-02 ENCOUNTER — Telehealth (HOSPITAL_COMMUNITY): Payer: Self-pay | Admitting: Family Medicine

## 2021-08-02 NOTE — Telephone Encounter (Signed)
I called to schedule echocardiogram that was ordered by Dr. Volanda Napoleon and patient states she has a full calendar. She is seeing the cardiologist and will wait and see if she thinks an echocardiogram is needed and if so she will schedule.

## 2021-08-04 ENCOUNTER — Encounter (INDEPENDENT_AMBULATORY_CARE_PROVIDER_SITE_OTHER): Payer: Medicare Other | Admitting: Ophthalmology

## 2021-08-04 ENCOUNTER — Other Ambulatory Visit: Payer: Self-pay

## 2021-08-04 DIAGNOSIS — H43811 Vitreous degeneration, right eye: Secondary | ICD-10-CM

## 2021-08-04 DIAGNOSIS — H348322 Tributary (branch) retinal vein occlusion, left eye, stable: Secondary | ICD-10-CM | POA: Diagnosis not present

## 2021-08-04 DIAGNOSIS — I10 Essential (primary) hypertension: Secondary | ICD-10-CM | POA: Diagnosis not present

## 2021-08-04 DIAGNOSIS — H353211 Exudative age-related macular degeneration, right eye, with active choroidal neovascularization: Secondary | ICD-10-CM | POA: Diagnosis not present

## 2021-08-04 DIAGNOSIS — H33302 Unspecified retinal break, left eye: Secondary | ICD-10-CM

## 2021-08-04 DIAGNOSIS — H35372 Puckering of macula, left eye: Secondary | ICD-10-CM | POA: Diagnosis not present

## 2021-08-04 DIAGNOSIS — H35033 Hypertensive retinopathy, bilateral: Secondary | ICD-10-CM

## 2021-08-11 ENCOUNTER — Ambulatory Visit: Payer: Medicare Other

## 2021-08-11 ENCOUNTER — Telehealth: Payer: Self-pay

## 2021-08-11 NOTE — Telephone Encounter (Signed)
Pt LVM she needs to RS apt for today due to transportation problems.

## 2021-08-11 NOTE — Telephone Encounter (Signed)
Pt returned call and is scheduled for 1/19 at 10am

## 2021-08-11 NOTE — Telephone Encounter (Signed)
LVM she could be RS for next week, on 1/17 or 1/19 at 10am if she would like.

## 2021-08-15 ENCOUNTER — Ambulatory Visit (INDEPENDENT_AMBULATORY_CARE_PROVIDER_SITE_OTHER): Payer: Medicare Other | Admitting: Family Medicine

## 2021-08-15 VITALS — BP 118/68 | HR 66 | Temp 98.3°F | Wt 224.0 lb

## 2021-08-15 DIAGNOSIS — R252 Cramp and spasm: Secondary | ICD-10-CM

## 2021-08-15 DIAGNOSIS — I890 Lymphedema, not elsewhere classified: Secondary | ICD-10-CM

## 2021-08-15 DIAGNOSIS — I872 Venous insufficiency (chronic) (peripheral): Secondary | ICD-10-CM | POA: Diagnosis not present

## 2021-08-15 DIAGNOSIS — R600 Localized edema: Secondary | ICD-10-CM

## 2021-08-15 DIAGNOSIS — Z853 Personal history of malignant neoplasm of breast: Secondary | ICD-10-CM | POA: Diagnosis not present

## 2021-08-15 MED ORDER — FUROSEMIDE 20 MG PO TABS: 40.0000 mg | ORAL_TABLET | Freq: Every day | ORAL | 2 refills | Status: DC

## 2021-08-15 MED ORDER — POTASSIUM CHLORIDE CRYS ER 20 MEQ PO TBCR: 40.0000 meq | EXTENDED_RELEASE_TABLET | Freq: Every day | ORAL | 2 refills | Status: DC

## 2021-08-15 NOTE — Patient Instructions (Addendum)
At today's visit we discussed increasing her Lasix from 1 tablet (20 mg) daily to 2 tabs (40 mg) per day.  We will also increase your dose of Klor-Con, the potassium supplement to 2 tabs daily.  A new prescription for these medications was sent to your pharmacy.  A prescription was printed for new bras.  I will check on placing a referral for the lymphedema clinic.  You can unwrap your legs later this evening before bed.

## 2021-08-15 NOTE — Progress Notes (Signed)
Subjective:    Patient ID: Kelli Martinez, female    DOB: 1946/04/14, 76 y.o.   MRN: [id number]  Chief Complaint  Patient presents with   Follow-up    Palpitations, memory def,  Wants to see about an INR check    HPI Patient was seen today for f/u.  Pt still with LE edema and palpitations.  Patient notes some improvement in LE edema but still has difficulty with discomfort due to the swelling.  Taking Lasix at night.  Still having intermittent episodes of palpitations where her heart is racing.  Patient has an upcoming appointment with cardiology.  Endorses drinking pickle juice when has cramping/muscle spasms.  Patient inquires about having INR checked early to prevent additional visit to the office.  Patient inquires about referral to lymphedema clinic for RUE lymphedema status post history of breast cancer and prescription for new bras/inserts from [facility].  Past Medical History:  Diagnosis Date   Allergy    Anemia    Arthritis    Asthma    Back pain, chronic    GETS INJECTIONS IN BACK   Blind right eye    hemorrhage   Blood transfusion    Cancer ([place])    breast 1994   Clotting disorder ([place])    Diabetes mellitus without complication ([place])    takes precose   Elevated liver enzymes    Gallstones    GERD (gastroesophageal reflux disease)    HX: breast cancer    Hyperlipidemia    Hypertension    Hypothyroid    Lymphedema of arm    RT   Morbid obesity ([facility])    OSA (obstructive sleep apnea)    has not used in 2 years-lost weight   Osteoporosis    Peripheral neuropathy    on gabapentin   PONV (postoperative nausea and vomiting)    Psoriasis    Pulmonary embolism ([place]) 1998 / 1994 /1968   Syncope and collapse 2011   due to anemia    Allergies  Allergen Reactions   Adhesive [Tape] Rash    ROS General: Denies fever, chills, night sweats, changes in weight, changes in appetite HEENT: Denies headaches, ear pain, changes in vision, rhinorrhea, sore  throat CV: Denies CP, SOB, orthopnea + palpitations Pulm: Denies SOB, cough, wheezing GI: Denies abdominal pain, nausea, vomiting, diarrhea, constipation GU: Denies dysuria, hematuria, frequency, vaginal discharge Msk: Denies joint pains  + intermittent muscle cramps Neuro: Denies weakness, numbness, tingling Skin: Denies rashes, bruising + lymphedema in the right UE, bilateral lower extremity edema Psych: Denies depression, anxiety, hallucinations    Objective:    Blood pressure 118/68, pulse 66, temperature 98.3 F (36.8 C), temperature source Oral, weight 224 lb (101.6 kg), SpO2 99 %.  Gen. Pleasant, well-nourished, in no distress, normal affect   HEENT: [place]/AT, face symmetric, conjunctiva clear, no scleral icterus, PERRLA, EOMI, nares patent without drainage Lungs: no accessory muscle use, CTAB, no wheezes or rales Cardiovascular: RRR, no m/r/g, 2+ pitting pedal edema in b/l LEs, trace at knee Abdomen: BS present, soft, NT/ND Musculoskeletal: No deformities, no cyanosis or clubbing, normal tone.  Bilateral LEs wrapped with Ace bandages in clinic by this provider for edema. Neuro:  A&Ox3, CN II-XII intact, ambulating with cane Skin:  Warm, no lesions/ rash   Wt Readings from Last 3 Encounters:  08/15/21 224 lb (101.6 kg)  07/08/21 221 lb (100.2 kg)  04/28/21 215 lb 12.8 oz (97.9 kg)    Lab Results  Component Value Date  WBC 4.2 07/08/2021   HGB 12.1 07/08/2021   HCT 37.4 07/08/2021   PLT 170.0 07/08/2021   GLUCOSE 107 (H) 07/08/2021   CHOL 200 07/08/2021   TRIG 50.0 07/08/2021   HDL 102.90 07/08/2021   LDLCALC 88 07/08/2021   ALT 31 07/08/2021   AST 26 07/08/2021   NA 139 07/08/2021   K 4.1 07/08/2021   CL 101 07/08/2021   CREATININE 0.85 07/08/2021   BUN 13 07/08/2021   CO2 30 07/08/2021   TSH 1.90 07/08/2021   INR 2.1 07/11/2021   HGBA1C 6.7 (H) 04/25/2021   MICROALBUR <0.7 04/26/2021    Assessment/Plan:  Edema of both lower extremities due to peripheral  venous insufficiency  -BNP elevated at 223 on 07/08/22 -Continue elevating LEs when sitting, compression socks or TED hose, and decreasing sodium intake. -Bilateral LEs wrapped with Ace bandages in clinic. -Lasix increased to 40 mg.   Patient to take additional Klor-Con 20 mEq with the increased dose (2 tabs daily) -Encouraged to keep upcoming appointment with cardiology - Plan: potassium chloride SA (KLOR-CON M) 20 MEQ tablet, furosemide (LASIX) 20 MG tablet  Lymphedema of right arm  -s/p right mastectomy -Will look into referral for lymphedema clinic -Rx printed out for new bras - Plan: For home use only DME Other see comment  History of breast cancer  - Plan: For home use only DME Other see comment  Muscle cramps -Potassium 4.1 on 07/08/2021 - Plan: potassium chloride SA (KLOR-CON M) 20 MEQ tablet  F/u as needed in the next month  Grier Mitts, MD

## 2021-08-16 ENCOUNTER — Encounter: Payer: Self-pay | Admitting: Internal Medicine

## 2021-08-16 ENCOUNTER — Ambulatory Visit (INDEPENDENT_AMBULATORY_CARE_PROVIDER_SITE_OTHER): Payer: Medicare Other | Admitting: Internal Medicine

## 2021-08-16 ENCOUNTER — Other Ambulatory Visit: Payer: Self-pay

## 2021-08-16 VITALS — BP 116/68 | HR 66 | Resp 20 | Ht 67.0 in | Wt 227.0 lb

## 2021-08-16 DIAGNOSIS — I872 Venous insufficiency (chronic) (peripheral): Secondary | ICD-10-CM | POA: Diagnosis not present

## 2021-08-16 DIAGNOSIS — I5030 Unspecified diastolic (congestive) heart failure: Secondary | ICD-10-CM

## 2021-08-16 MED ORDER — FUROSEMIDE 20 MG PO TABS: 40.0000 mg | ORAL_TABLET | Freq: Every day | ORAL | 2 refills | Status: DC

## 2021-08-16 MED ORDER — SPIRONOLACTONE 100 MG PO TABS: 100.0000 mg | ORAL_TABLET | Freq: Every day | ORAL | 3 refills | Status: DC

## 2021-08-16 NOTE — Patient Instructions (Signed)
Medication Instructions:  START: SPIRONOLACTONE 100mg  ONCE DAILY   START: LASIX 40mg  ONCE DAILY- MAY TAKE ADDITIONAL DOSE FOR A TOTAL OF 80mg  DAILY FOR WEIGHT GAIN OF 3 POUNDS OVERNIGHT OR 5 POUNDS IN 1 WEEK  *If you need a refill on your cardiac medications before your next appointment, please call your pharmacy*  Lab Work: Please return for Blood Work in [place]. No appointment needed, lab here at the office is open Monday-Friday from 8AM to 4PM and closed daily for lunch from 12:45-1:45.  If you have labs (blood work) drawn today and your tests are completely normal, you will receive your results only by: [place] (if you have MyChart) OR A paper copy in the mail If you have any lab test that is abnormal or we need to change your treatment, we will call you to review the results.  Testing/Procedures: Your physician has requested that you have an echocardiogram. Echocardiography is a painless test that uses sound waves to create images of your heart. It provides your doctor with information about the size and shape of your heart and how well your hearts chambers and valves are working. You may receive an ultrasound enhancing agent through an IV if needed to better visualize your heart during the echo.This procedure takes approximately one hour. There are no restrictions for this procedure. This will take place at the [address].   Follow-Up: At [facility], you and your health needs are our priority.  As part of our continuing mission to provide you with exceptional heart care, we have created designated Provider Care Teams.  These Care Teams include your primary Cardiologist (physician) and Advanced Practice Providers (APPs -  Physician Assistants and Nurse Practitioners) who all work together to provide you with the care you need, when you need it.  Your next appointment:   1 month(s)  The format for your next appointment:   In Person  Provider:   Janina Mayo, MD    Other Instructions  PLEASE LIMIT FLUID TO LESS THAN 2 LITERS IN ONE DAY.   PLEASE LIMIT SODIUM INTAKE TO LESS THAN 2 GRAMS IN ONE DAY  Low-Sodium Eating Plan Sodium, which is an element that makes up salt, helps you maintain a healthy balance of fluids in your body. Too much sodium can increase your blood pressure and cause fluid and waste to be held in your body. Your health care provider or dietitian may recommend following this plan if you have high blood pressure (hypertension), kidney disease, liver disease, or heart failure. Eating less sodium can help lower your blood pressure, reduce swelling, and protect your heart, liver, and kidneys. What are tips for following this plan? Reading food labels The Nutrition Facts label lists the amount of sodium in one serving of the food. If you eat more than one serving, you must multiply the listed amount of sodium by the number of servings. Choose foods with less than 140 mg of sodium per serving. Avoid foods with 300 mg of sodium or more per serving. Shopping  Look for lower-sodium products, often labeled as "low-sodium" or "no salt added." Always check the sodium content, even if foods are labeled as "unsalted" or "no salt added." Buy fresh foods. Avoid canned foods and pre-made or frozen meals. Avoid canned, cured, or processed meats. Buy breads that have less than 80 mg of sodium per slice. Cooking  Eat more home-cooked food and less restaurant, buffet, and fast food. Avoid adding  salt when cooking. Use salt-free seasonings or herbs instead of table salt or sea salt. Check with your health care provider or pharmacist before using salt substitutes. Cook with plant-based oils, such as canola, sunflower, or olive oil. Meal planning When eating at a restaurant, ask that your food be prepared with less salt or no salt, if possible. Avoid dishes labeled as brined, pickled, cured, smoked, or made with soy sauce, miso, or teriyaki  sauce. Avoid foods that contain MSG (monosodium glutamate). MSG is sometimes added to [place] food, bouillon, and some canned foods. Make meals that can be grilled, baked, poached, roasted, or steamed. These are generally made with less sodium. General information Most people on this plan should limit their sodium intake to 1,500-2,000 mg (milligrams) of sodium each day. What foods should I eat? Fruits Fresh, frozen, or canned fruit. Fruit juice. Vegetables Fresh or frozen vegetables. "No salt added" canned vegetables. "No salt added" tomato sauce and paste. Low-sodium or reduced-sodium tomato and vegetable juice. Grains Low-sodium cereals, including oats, puffed wheat and rice, and shredded wheat. Low-sodium crackers. Unsalted rice. Unsalted pasta. Low-sodium bread. Whole-grain breads and whole-grain pasta. Meats and other proteins Fresh or frozen (no salt added) meat, poultry, seafood, and fish. Low-sodium canned tuna and salmon. Unsalted nuts. Dried peas, beans, and lentils without added salt. Unsalted canned beans. Eggs. Unsalted nut butters. Dairy Milk. Soy milk. Cheese that is naturally low in sodium, such as ricotta cheese, fresh mozzarella, or Swiss cheese. Low-sodium or reduced-sodium cheese. Cream cheese. Yogurt. Seasonings and condiments Fresh and dried herbs and spices. Salt-free seasonings. Low-sodium mustard and ketchup. Sodium-free salad dressing. Sodium-free light mayonnaise. Fresh or refrigerated horseradish. Lemon juice. Vinegar. Other foods Homemade, reduced-sodium, or low-sodium soups. Unsalted popcorn and pretzels. Low-salt or salt-free chips. The items listed above may not be a complete list of foods and beverages you can eat. Contact a dietitian for more information. What foods should I avoid? Vegetables Sauerkraut, pickled vegetables, and relishes. Olives. [place] fries. Onion rings. Regular canned vegetables (not low-sodium or reduced-sodium). Regular canned tomato  sauce and paste (not low-sodium or reduced-sodium). Regular tomato and vegetable juice (not low-sodium or reduced-sodium). Frozen vegetables in sauces. Grains Instant hot cereals. Bread stuffing, pancake, and biscuit mixes. Croutons. Seasoned rice or pasta mixes. Noodle soup cups. Boxed or frozen macaroni and cheese. Regular salted crackers. Self-rising flour. Meats and other proteins Meat or fish that is salted, canned, smoked, spiced, or pickled. Precooked or cured meat, such as sausages or meat loaves. Berniece Salines. Ham. Pepperoni. Hot dogs. Corned beef. Chipped beef. Salt pork. Jerky. Pickled herring. Anchovies and sardines. Regular canned tuna. Salted nuts. Dairy Processed cheese and cheese spreads. Hard cheeses. Cheese curds. Blue cheese. Feta cheese. String cheese. Regular cottage cheese. Buttermilk. Canned milk. Fats and oils Salted butter. Regular margarine. Ghee. Bacon fat. Seasonings and condiments Onion salt, garlic salt, seasoned salt, table salt, and sea salt. Canned and packaged gravies. Worcestershire sauce. Tartar sauce. Barbecue sauce. Teriyaki sauce. Soy sauce, including reduced-sodium. Steak sauce. Fish sauce. Oyster sauce. Cocktail sauce. Horseradish that you find on the shelf. Regular ketchup and mustard. Meat flavorings and tenderizers. Bouillon cubes. Hot sauce. Pre-made or packaged marinades. Pre-made or packaged taco seasonings. Relishes. Regular salad dressings. Salsa. Other foods Salted popcorn and pretzels. Corn chips and puffs. Potato and tortilla chips. Canned or dried soups. Pizza. Frozen entrees and pot pies. The items listed above may not be a complete list of foods and beverages you should avoid. Contact a dietitian for more information.  Summary Eating less sodium can help lower your blood pressure, reduce swelling, and protect your heart, liver, and kidneys. Most people on this plan should limit their sodium intake to 1,500-2,000 mg (milligrams) of sodium each  day. Canned, boxed, and frozen foods are high in sodium. Restaurant foods, fast foods, and pizza are also very high in sodium. You also get sodium by adding salt to food. Try to cook at home, eat more fresh fruits and vegetables, and eat less fast food and canned, processed, or prepared foods. This information is not intended to replace advice given to you by your health care provider. Make sure you discuss any questions you have with your health care provider. Document Revised: 08/22/2019 Document Reviewed: 06/18/2019 Elsevier Patient Education  2022 [employer].

## 2021-08-16 NOTE — Progress Notes (Signed)
Cardiology Office Note:    Date:  08/16/2021   ID:  Kelli Martinez, DOB November 24, 1945, MRN [id number]  PCP:  Kelli Ruddy, MD   [facility] Providers Cardiologist:  Kelli Mayo, MD     Referring MD: Kelli Ruddy, MD   No chief complaint on file. LE edema  History of Present Illness:    Kelli Martinez is a 76 y.o. female with a hx of DM2, GERD, HTN, OSA prescribed cpap, DVT/ recurrent PE s.p IVC filter (per patient since 2011) continued on coumadin, gastric bypass surgery, breast cancer ~ 30 years ago  s/p chemo (unknown) bone marrow transplant in remission referral from Dr. Volanda Martinez regarding palpitations, LE edema and weight gain  [name] reports that her legs have had increased swelling. She keeps her legs elevated at night. Its been about a year in a half that it has progressed. She was being seen by [facility] clinic. She can get easily fatigued with daily activities. She sleeps with 2 pillows. She says lying more flat makes her feel bad/head hurts.  She says that walking to the kitchen and back she gets SOB.  She denies PND.  Her weight is up 3 pounds today from yesterday. No prior hx of CHF.   She saw Dr. Volanda Martinez 07/08/2021 with concern for CHF. She was taking lasix 20 mg at night.  BNP was mildly elevated 223. Her echo is pending. Her chest xray showed no edema. Her lasix was increased from 20 mg to 40 mg daily started yesterday. She has normal renal function. She's on spironolactone takes 50 mg some days and 100 mg some days. Blood pressure is in good control.  She's had report of LE edema over the summer. She was taking lasix 20 mg with this. She's had right UE edema 2/2 lymphadema with hx of breast cancer.  Otherwise she's had significant leg cramping, statin was reduced previously.  She saw Dr. Martinique in 2012. He notes: She has remote history of syncope with normal cardiac evaluation in the past including echocardiogram, nuclear stress testing, and  tilt table testing. She has a history of recurrent pulmonary emboli, diabetes, and hypertension.   Cardiology Studies TTE 10/09/2019  1. Left ventricular ejection fraction by 3D volume is 57 %. The left  ventricle has normal function. The left ventricle has no regional wall  motion abnormalities. Left ventricular diastolic parameters were normal.  The average left ventricular global  longitudinal strain is -23.6 % (normal).   2. Right ventricular systolic function is normal. The right ventricular  size is normal. There is normal pulmonary artery systolic pressure. The  estimated right ventricular systolic pressure is 24.2 mmHg.   3. The mitral valve is abnormal. Mild mitral annular calcification.  Mitral valve leaflets demonstrate mild bowing in systole, without definite  prolapse. Mild mitral valve regurgitation. No evidence of mitral stenosis.   4. The aortic valve is tricuspid. Aortic valve regurgitation is trivial.  Mild aortic valve sclerosis is present, with no evidence of aortic valve  stenosis.   5. The inferior vena cava is normal in size with greater than 50%  respiratory variability, suggesting right atrial pressure of 3 mmHg.  Wt Readings from Last 3 Encounters:  08/16/21 227 lb (103 kg)  08/15/21 224 lb (101.6 kg)  07/08/21 221 lb (100.2 kg)      Past Medical History:  Diagnosis Date   Allergy    Anemia    Arthritis    Asthma  Back pain, chronic    GETS INJECTIONS IN BACK   Blind right eye    hemorrhage   Blood transfusion    Cancer ([place])    breast 1994   Clotting disorder ([place])    Diabetes mellitus without complication ([place])    takes precose   Elevated liver enzymes    Gallstones    GERD (gastroesophageal reflux disease)    HX: breast cancer    Hyperlipidemia    Hypertension    Hypothyroid    Lymphedema of arm    RT   Morbid obesity ([facility])    OSA (obstructive sleep apnea)    has not used in 2 years-lost weight   Osteoporosis    Peripheral  neuropathy    on gabapentin   PONV (postoperative nausea and vomiting)    Psoriasis    Pulmonary embolism ([place]) 1998 / 1994 /1968   Syncope and collapse 2011   due to anemia    Past Surgical History:  Procedure Laterality Date   ABDOMINAL HYSTERECTOMY     BONE MARROW TRANSPLANT  1994   CARDIOVASCULAR STRESS TEST  05/23/2005   EF 53%   carpal tunnell     bil   cataracts     CHOLECYSTECTOMY  05/10/2012   Procedure: LAPAROSCOPIC CHOLECYSTECTOMY WITH INTRAOPERATIVE CHOLANGIOGRAM;  Surgeon: Pedro Earls, MD;  Location: [facility];  Service: General;  Laterality: N/A;  Laparoscopic Cholecystectomy with Intraoperative Cholangiogram   GASTRIC BYPASS  2011   bariatric surgery   HIATAL HERNIA REPAIR     IVC filter     recurrent DVT   KNEE ARTHROTOMY  1998   MASS EXCISION Right 11/24/2016   Procedure: EXCISION RIGHT AXILLARY LESION AND CHEST WALL LESION;  Surgeon: Johnathan Hausen, MD;  Location: [facility];  Service: General;  Laterality: Right;   MASTECTOMY     Right   US ECHOCARDIOGRAPHY  10/27/08   EF 55-60%    Current Medications: Current Meds  Medication Sig   acarbose (PRECOSE) 50 MG tablet 1 tablet at start of breakfast and lunch and half tablet at dinnertime   ADVAIR DISKUS 100-50 MCG/ACT AEPB USE 1 INHALATION EVERY 12 HOURS, RINSE MOUTH AFTER USING   albuterol (PROAIR HFA) 108 (90 Base) MCG/ACT inhaler Inhale 2 puffs into the lungs every 6 (six) hours as needed for wheezing or shortness of breath.   azelastine (ASTELIN) 0.1 % nasal spray USE 1 TO 2 SPRAYS IN EACH NOSTRIL TWICE A DAY AS NEEDED   baclofen (LIORESAL) 20 MG tablet TAKE 1 TABLET THREE TIMES A DAY (UPDATED PRESCRIPTION)   Blood Glucose Monitoring Suppl (FREESTYLE FREEDOM LITE) w/Device KIT Use as directed to check blood sugar once daily.   Calcium Carbonate-Vitamin D 600-400 MG-UNIT tablet Take 1 tablet by mouth daily.   clobetasol cream (TEMOVATE) 1.74 % APPLY 1 APPLICATION TOPICALLY TWICE A DAY    COVID-19 mRNA vaccine, Pfizer, 30 MCG/0.3ML injection    diazepam (DIASTAT ACUDIAL) 10 MG GEL Place 5 mg rectally once as needed (cramping).   ferrous sulfate 325 (65 FE) MG tablet Take 325 mg by mouth daily.   fluticasone (FLONASE) 50 MCG/ACT nasal spray USE 2 SPRAYS IN EACH NOSTRIL DAILY   FREESTYLE LITE test strip USE AS INSTRUCTED TO CHECK BLOOD SUGAR ONCE DAILY   gabapentin (NEURONTIN) 600 MG tablet TAKE 1 TABLET THREE TIMES A DAY (DOSE CHANGE)   HYDROcodone-acetaminophen (NORCO) 5-325 MG tablet Take 1 tablet by mouth 2 (two) times daily as needed for moderate pain. (  Patient taking differently: Take 0.5-1 tablets by mouth 3 (three) times daily as needed for moderate pain or severe pain.)   Lancets (FREESTYLE) lancets USE AS INSTRUCTED TO CHECK BLOOD SUGAR ONCE DAILY   levothyroxine (SYNTHROID) 100 MCG tablet Take 1 tablet (100 mcg total) by mouth daily before breakfast.   Magnesium Cl-Calcium Carbonate (SLOW-MAG PO) Take 500 mg by mouth at bedtime.    methylcellulose (ARTIFICIAL TEARS) 1 % ophthalmic solution Place 1 drop into both eyes as needed. Dry eyes   omeprazole (PRILOSEC) 20 MG capsule TAKE 1 CAPSULE DAILY   potassium chloride SA (KLOR-CON M) 20 MEQ tablet Take 2 tablets (40 mEq total) by mouth daily.   pravastatin (PRAVACHOL) 10 MG tablet Take 1 tablet (10 mg total) by mouth daily.   temazepam (RESTORIL) 15 MG capsule 1-2 for sleep as needed (Patient taking differently: Take 15-30 mg by mouth at bedtime as needed for sleep. 1-2 for sleep as needed)   vitamin B-12 (CYANOCOBALAMIN) 500 MCG tablet Take 500 mcg by mouth daily.   Vitamin D, Ergocalciferol, (DRISDOL) 1.25 MG (50000 UNIT) CAPS capsule TAKE 1 CAPSULE EVERY FRIDAY   warfarin (COUMADIN) 5 MG tablet Please take 2 tablets daily except take 1 tablet on Wed or Take as directed by anticoagulation clinic   Zoster Vaccine Adjuvanted [facility]) injection    [DISCONTINUED] furosemide (LASIX) 20 MG tablet Take 2 tablets (40 mg total) by  mouth daily.   [DISCONTINUED] spironolactone (ALDACTONE) 50 MG tablet TAKE 1 TABLET TWICE A DAY     Allergies:   Adhesive [tape]   Social History   Socioeconomic History   Marital status: Widowed    Spouse name: Not on file   Number of children: 2   Years of education: Not on file   Highest education level: Not on file  Occupational History   Occupation: retired  Tobacco Use   Smoking status: Former    Packs/day: 0.50    Years: 10.00    Pack years: 5.00    Types: Cigarettes    Quit date: 07/31/1978    Years since quitting: 43.0   Smokeless tobacco: Never  Vaping Use   Vaping Use: Never used  Substance and Sexual Activity   Alcohol use: No   Drug use: No   Sexual activity: Never  Other Topics Concern   Not on file  Social History Narrative   Widowed with 2 children,  Lives in a one story home and her son and his wife recently moved in with her.  Retired Freight forwarder for [employer].  Education: some college.    Social Determinants of Health   Financial Resource Strain: Low Risk    Difficulty of Paying Living Expenses: Not hard at all  Food Insecurity: No Food Insecurity   Worried About Charity fundraiser in the Last Year: Never true   [place] in the Last Year: Never true  Transportation Needs: No Transportation Needs   Lack of Transportation (Medical): No   Lack of Transportation (Non-Medical): No  Physical Activity: Insufficiently Active   Days of Exercise per Week: 7 days   Minutes of Exercise per Session: 20 min  Stress: No Stress Concern Present   Feeling of Stress : Not at all  Social Connections: Moderately Isolated   Frequency of Communication with Friends and Family: More than three times a week   Frequency of Social Gatherings with Friends and Family: Twice a week   Attends Religious Services: More than 4 times per year  Active Member of Clubs or Organizations: No   Attends Archivist Meetings: Never   Marital Status: Widowed     Family  History: The patient's family history includes Asthma in her daughter; Cancer (age of onset: 12) in her sister; Cancer (age of onset: 16) in her father; Sudden death in her mother.  ROS:   Please see the history of present illness.     All other systems reviewed and are negative.  EKGs/Labs/Other Studies Reviewed:    The following studies were reviewed today:   EKG:  EKG is  ordered today.  The ekg ordered today demonstrates   Sinus rhythm with 1st degree AV block PR 229m  Recent Labs: 09/27/2020: Magnesium 2.0 07/08/2021: ALT 31; BUN 13; Creatinine, Ser 0.85; Hemoglobin 12.1; Platelets 170.0; Potassium 4.1; Pro B Natriuretic peptide (BNP) 223.0; Sodium 139; TSH 1.90  Recent Lipid Panel    Component Value Date/Time   CHOL 200 07/08/2021 1219   TRIG 50.0 07/08/2021 1219   HDL 102.90 07/08/2021 1219   CHOLHDL 2 07/08/2021 1219   VLDL 10.0 07/08/2021 1219   LDLCALC 88 07/08/2021 1219     Risk Assessment/Calculations:           Physical Exam:    VS:  Vitals:   08/16/21 0853  BP: 116/68  Pulse: 66  Resp: 20  SpO2: 98%     Wt Readings from Last 3 Encounters:  08/16/21 227 lb (103 kg)  08/15/21 224 lb (101.6 kg)  07/08/21 221 lb (100.2 kg)     GEN:  Well nourished, well developed in no acute distress HEENT: Normal NECK: No significant JVD; No carotid bruits LYMPHATICS: No lymphadenopathy CARDIAC: RRR, no murmurs, rubs, gallops RESPIRATORY:  Clear to auscultation without rales, wheezing or rhonchi  ABDOMEN: Soft, non-tender, non-distended MUSCULOSKELETAL: RUE edema, BL LE edema thigh to the feet SKIN: Warm and dry NEUROLOGIC:  Alert and oriented x 3 PSYCHIATRIC:  Normal affect   ASSESSMENT:    #HFpEF:  signs c/f Hfpef. Her BNP is mildly elevated but could be underestimated with weight. She did report some discomfort with lying flat. Edema also in the setting of chronic lymphadema. Albumin is normal at 4.2. She reports significant leg cramping with  increasing diuresis but she reports it gets better with baclofen in the setting of lumbar disc extrusion L3-L4; will plan to check her labs in a couple of weeks to ensure not related to electrolytes - pending TTE - continue lasix at 40 mg and increase to  lasix 80 mg for weight gain > 3 pounds and 5 pounds in a week; low sodium diet < 2 per day. Fluid < 2L - increase to spironolactone 100 mg daily - BMET in 2 weeks - lymphedema clinic  #HLD: LDL 88 mg/dL near goal  PLAN:    In order of problems listed above:  Follow up echo scheduled Continue lasix 40 mg daily Increased spironolactone 100 mg daily BMET 2 weeks  Follow up 1 month         Medication Adjustments/Labs and Tests Ordered: Current medicines are reviewed at length with the patient today.  Concerns regarding medicines are outlined above.  Orders Placed This Encounter  Procedures   Basic metabolic panel   EKG 125-OIBB  ECHOCARDIOGRAM COMPLETE   Meds ordered this encounter  Medications   furosemide (LASIX) 20 MG tablet    Sig: Take 2 tablets (40 mg total) by mouth daily. MAY TAKE ADDITIONAL DOSE FOR A TOTAL OF 848m  DAILY FOR WEIGHT GAIN OF 3 POUNDS OVERNIGHT OR 5 POUNDS IN ONE WEEK.    Dispense:  180 tablet    Refill:  2   spironolactone (ALDACTONE) 100 MG tablet    Sig: Take 1 tablet (100 mg total) by mouth daily.    Dispense:  30 tablet    Refill:  3    YOUR PATIENT HAS REQUESTED A REFILL OF THIS MEDICATION, PREVIOUSLY AUTHORIZED BY ANOTHER PRESCRIBER.    Patient Instructions  Medication Instructions:  START: SPIRONOLACTONE 162m ONCE DAILY   START: LASIX 442mONCE DAILY- MAY TAKE ADDITIONAL DOSE FOR A TOTAL OF 8065mAILY FOR WEIGHT GAIN OF 3 POUNDS OVERNIGHT OR 5 POUNDS IN 1 WEEK  *If you need a refill on your cardiac medications before your next appointment, please call your pharmacy*  Lab Work: Please [place] Blood Work [place], lab here at the office is open Monday-Friday  from 8AM to 4PM and closed daily for lunch from 12:45-1:45.  If you have labs (blood work) drawn today and your tests are completely normal, you will receive your results only by: [facility] have MyChart) OR A paper copy in the mail If you have any lab test that is abnormal or we need to change your treatment, we will call you to review the results.  Testing/Procedures: Your physician has requested that you have an echocardiogram. Echocardiography is a painless test that uses sound waves to create images of your heart. It provides your doctor with information about the size and shape of your heart and how well your hearts chambers and valves are working. You may receive an ultrasound enhancing agent through an IV if needed to better visualize your heart during the echo.This procedure takes approximately one hour. There are no restrictions for this procedure. This [address] take place [address]   Follow-Up: [facility] and your health needs are our priority.  As part of our continuing mission to provide you with exceptional heart care, we have created designated Provider Care Teams.  These Care Teams include your primary Cardiologist (physician) and Advanced Practice Providers (APPs -  Physician Assistants and Nurse Practitioners) who all work together to provide you with the care you need, when you need it.  Your next appointment:   1 month(s)  The format for your next appointment:   In Person  Provider:   BraJanina MayoD    Other Instructions  PLEASE LIMIT FLUID TO LESS THAN 2 LITERS IN ONE DAY.   PLEASE LIMIT SODIUM INTAKE TO LESS THAN 2 GRAMS IN ONE DAY  Low-Sodium Eating Plan Sodium, which is an element that makes up salt, helps you maintain a healthy balance of fluids in your body. Too much sodium can increase your blood pressure and cause fluid and waste to be held in your body. Your health care provider or dietitian may recommend  following this plan if you have high blood pressure (hypertension), kidney disease, liver disease, or heart failure. Eating less sodium can help lower your blood pressure, reduce swelling, and protect your heart, liver, and kidneys. What are tips for following this plan? Reading food labels The Nutrition Facts label lists the amount of sodium in one serving of the food. If you eat more than one serving, you must multiply the listed amount of sodium by the number of servings. Choose foods with less than 140 mg of sodium per serving. Avoid  foods with 300 mg of sodium or more per serving. Shopping  Look for lower-sodium products, often labeled as "low-sodium" or "no salt added." Always check the sodium content, even if foods are labeled as "unsalted" or "no salt added." Buy fresh foods. Avoid canned foods and pre-made or frozen meals. Avoid canned, cured, or processed meats. Buy breads that have less than 80 mg of sodium per slice. Cooking  Eat more home-cooked food and less restaurant, buffet, and fast food. Avoid adding salt when cooking. Use salt-free seasonings or herbs instead of table salt or sea salt. Check with your health care provider or pharmacist before using salt substitutes. Cook with plant-based oils, such as canola, sunflower, or olive oil. Meal planning When eating at a restaurant, ask that your food be prepared with less salt or no salt, if possible. Avoid dishes labeled as brined, pickled, cured, smoked, or made with soy sauce, miso, or teriyaki sauce. Avoid foods that contain MSG (monosodium glutamate). MSG is sometimes added to [place] food, bouillon, and some canned foods. Make meals that can be grilled, baked, poached, roasted, or steamed. These are generally made with less sodium. General information Most people on this plan should limit their sodium intake to 1,500-2,000 mg (milligrams) of sodium each day. What foods should I eat? Fruits Fresh, frozen, or canned  fruit. Fruit juice. Vegetables Fresh or frozen vegetables. "No salt added" canned vegetables. "No salt added" tomato sauce and paste. Low-sodium or reduced-sodium tomato and vegetable juice. Grains Low-sodium cereals, including oats, puffed wheat and rice, and shredded wheat. Low-sodium crackers. Unsalted rice. Unsalted pasta. Low-sodium bread. Whole-grain breads and whole-grain pasta. Meats and other proteins Fresh or frozen (no salt added) meat, poultry, seafood, and fish. Low-sodium canned tuna and salmon. Unsalted nuts. Dried peas, beans, and lentils without added salt. Unsalted canned beans. Eggs. Unsalted nut butters. Dairy Milk. Soy milk. Cheese that is naturally low in sodium, such as ricotta cheese, fresh mozzarella, or Swiss cheese. Low-sodium or reduced-sodium cheese. Cream cheese. Yogurt. Seasonings and condiments Fresh and dried herbs and spices. Salt-free seasonings. Low-sodium mustard and ketchup. Sodium-free salad dressing. Sodium-free light mayonnaise. Fresh or refrigerated horseradish. Lemon juice. Vinegar. Other foods Homemade, reduced-sodium, or low-sodium soups. Unsalted popcorn and pretzels. Low-salt or salt-free chips. The items listed above may not be a complete list of foods and beverages you can eat. Contact a dietitian for more information. What foods should I avoid? Vegetables Sauerkraut, pickled vegetables, and relishes. Olives. [place] fries. Onion rings. Regular canned vegetables (not low-sodium or reduced-sodium). Regular canned tomato sauce and paste (not low-sodium or reduced-sodium). Regular tomato and vegetable juice (not low-sodium or reduced-sodium). Frozen vegetables in sauces. Grains Instant hot cereals. Bread stuffing, pancake, and biscuit mixes. Croutons. Seasoned rice or pasta mixes. Noodle soup cups. Boxed or frozen macaroni and cheese. Regular salted crackers. Self-rising flour. Meats and other proteins Meat or fish that is salted, canned, smoked,  spiced, or pickled. Precooked or cured meat, such as sausages or meat loaves. Berniece Salines. Ham. Pepperoni. Hot dogs. Corned beef. Chipped beef. Salt pork. Jerky. Pickled herring. Anchovies and sardines. Regular canned tuna. Salted nuts. Dairy Processed cheese and cheese spreads. Hard cheeses. Cheese curds. Blue cheese. Feta cheese. String cheese. Regular cottage cheese. Buttermilk. Canned milk. Fats and oils Salted butter. Regular margarine. Ghee. Bacon fat. Seasonings and condiments Onion salt, garlic salt, seasoned salt, table salt, and sea salt. Canned and packaged gravies. Worcestershire sauce. Tartar sauce. Barbecue sauce. Teriyaki sauce. Soy sauce, including reduced-sodium. Steak sauce. Fish sauce.  Oyster sauce. Cocktail sauce. Horseradish that you find on the shelf. Regular ketchup and mustard. Meat flavorings and tenderizers. Bouillon cubes. Hot sauce. Pre-made or packaged marinades. Pre-made or packaged taco seasonings. Relishes. Regular salad dressings. Salsa. Other foods Salted popcorn and pretzels. Corn chips and puffs. Potato and tortilla chips. Canned or dried soups. Pizza. Frozen entrees and pot pies. The items listed above may not be a complete list of foods and beverages you should avoid. Contact a dietitian for more information. Summary Eating less sodium can help lower your blood pressure, reduce swelling, and protect your heart, liver, and kidneys. Most people on this plan should limit their sodium intake to 1,500-2,000 mg (milligrams) of sodium each day. Canned, boxed, and frozen foods are high in sodium. Restaurant foods, fast foods, and pizza are also very high in sodium. You also get sodium by adding salt to food. Try to cook at home, eat more fresh fruits and vegetables, and eat less fast food and canned, processed, or prepared foods. This information is not intended to replace advice given to you by your health care provider. Make sure you discuss any questions you have with your  health care provider. Document Revised: 08/22/2019 Document Reviewed: 06/18/2019 Elsevier Patient Education  2022 [place], Kelli Mayo, MD  08/16/2021 3:45 PM    [place]

## 2021-08-18 ENCOUNTER — Other Ambulatory Visit: Payer: Self-pay

## 2021-08-18 ENCOUNTER — Ambulatory Visit (INDEPENDENT_AMBULATORY_CARE_PROVIDER_SITE_OTHER): Payer: Medicare Other

## 2021-08-18 DIAGNOSIS — Z7901 Long term (current) use of anticoagulants: Secondary | ICD-10-CM | POA: Diagnosis not present

## 2021-08-18 LAB — POCT INR: INR: 2.5 (ref 2.0–3.0)

## 2021-08-18 NOTE — Patient Instructions (Addendum)
Pre visit review using our clinic review tool, if applicable. No additional management support is needed unless otherwise documented below in the visit note.  Continue to take 2 tablets daily except 1 tablet on Wed and Saturdays.  Re-check in 4 weeks.  

## 2021-08-18 NOTE — Progress Notes (Signed)
Continue to take 2 tablets daily except 1 tablet on Wed and Saturdays.  Re-check in 4 weeks.  

## 2021-08-24 ENCOUNTER — Encounter: Payer: Self-pay | Admitting: Family Medicine

## 2021-08-25 ENCOUNTER — Other Ambulatory Visit: Payer: Self-pay

## 2021-08-25 ENCOUNTER — Ambulatory Visit (HOSPITAL_COMMUNITY): Payer: Medicare Other | Attending: Cardiology

## 2021-08-25 DIAGNOSIS — I5030 Unspecified diastolic (congestive) heart failure: Secondary | ICD-10-CM | POA: Insufficient documentation

## 2021-08-25 DIAGNOSIS — I872 Venous insufficiency (chronic) (peripheral): Secondary | ICD-10-CM | POA: Insufficient documentation

## 2021-08-25 LAB — ECHOCARDIOGRAM COMPLETE
Area-P 1/2: 3.65 cm2
S' Lateral: 2.5 cm

## 2021-08-29 ENCOUNTER — Other Ambulatory Visit (INDEPENDENT_AMBULATORY_CARE_PROVIDER_SITE_OTHER): Payer: Medicare Other

## 2021-08-29 ENCOUNTER — Other Ambulatory Visit: Payer: Self-pay

## 2021-08-29 DIAGNOSIS — E063 Autoimmune thyroiditis: Secondary | ICD-10-CM | POA: Diagnosis not present

## 2021-08-29 DIAGNOSIS — E7800 Pure hypercholesterolemia, unspecified: Secondary | ICD-10-CM

## 2021-08-29 DIAGNOSIS — K912 Postsurgical malabsorption, not elsewhere classified: Secondary | ICD-10-CM

## 2021-08-29 DIAGNOSIS — E1142 Type 2 diabetes mellitus with diabetic polyneuropathy: Secondary | ICD-10-CM

## 2021-08-29 LAB — COMPREHENSIVE METABOLIC PANEL
ALT: 33 U/L (ref 0–35)
AST: 33 U/L (ref 0–37)
Albumin: 4.2 g/dL (ref 3.5–5.2)
Alkaline Phosphatase: 89 U/L (ref 39–117)
BUN: 25 mg/dL — ABNORMAL HIGH (ref 6–23)
CO2: 28 mEq/L (ref 19–32)
Calcium: 9.5 mg/dL (ref 8.4–10.5)
Chloride: 99 mEq/L (ref 96–112)
Creatinine, Ser: 1.13 mg/dL (ref 0.40–1.20)
GFR: 48.19 mL/min — ABNORMAL LOW (ref >60.00)
Glucose, Bld: 98 mg/dL (ref 70–99)
Potassium: 4.4 mEq/L (ref 3.5–5.1)
Sodium: 139 mEq/L (ref 135–145)
Total Bilirubin: 0.4 mg/dL (ref 0.2–1.2)
Total Protein: 7 g/dL (ref 6.0–8.3)

## 2021-08-29 LAB — TSH: TSH: 2.23 u[IU]/mL (ref 0.35–5.50)

## 2021-08-29 LAB — LIPID PANEL
Cholesterol: 193 mg/dL (ref 0–200)
HDL: 92.7 mg/dL (ref >39.00)
LDL Cholesterol: 90 mg/dL (ref 0–99)
NonHDL: 100.11
Total CHOL/HDL Ratio: 2
Triglycerides: 52 mg/dL (ref 0.0–149.0)
VLDL: 10.4 mg/dL (ref 0.0–40.0)

## 2021-08-29 LAB — HEMOGLOBIN A1C: Hgb A1c MFr Bld: 6.6 % — ABNORMAL HIGH (ref 4.6–6.5)

## 2021-08-31 ENCOUNTER — Encounter: Payer: Self-pay | Admitting: Family Medicine

## 2021-08-31 ENCOUNTER — Telehealth (INDEPENDENT_AMBULATORY_CARE_PROVIDER_SITE_OTHER): Payer: Medicare Other | Admitting: Family Medicine

## 2021-08-31 VITALS — Wt 210.6 lb

## 2021-08-31 DIAGNOSIS — J018 Other acute sinusitis: Secondary | ICD-10-CM | POA: Diagnosis not present

## 2021-08-31 DIAGNOSIS — R600 Localized edema: Secondary | ICD-10-CM

## 2021-08-31 MED ORDER — AMOXICILLIN-POT CLAVULANATE 500-125 MG PO TABS
1.0000 | ORAL_TABLET | Freq: Two times a day (BID) | ORAL | 0 refills | Status: AC
Start: 2021-08-31 — End: 2021-09-07

## 2021-08-31 MED ORDER — BENZONATATE 100 MG PO CAPS: 100.0000 mg | ORAL_CAPSULE | Freq: Two times a day (BID) | ORAL | 0 refills | Status: DC | PRN

## 2021-08-31 NOTE — Progress Notes (Signed)
Virtual Visit via Video Note  I connected with Kelli Martinez on 08/31/21 at 11:00 AM EST by a video enabled telemedicine application 2/2 [id number] pandemic and verified that I am speaking with the correct person using two identifiers.  Location patient: home Location provider:work or home office Persons participating in the virtual visit: patient, provider  I discussed the limitations of evaluation and management by telemedicine and the availability of in person appointments. The patient expressed understanding and agreed to proceed.  Chief Complaint  Patient presents with   Sinus Problem    Worse at night. Uses Adair BID & nasal spray that has been temporarily effective. Symptoms x 2 weeks. Denies fever. Has clear productive cough at night. This has been intermittently recurrent over 2 weeks.    HPI: Pt with sneezing, rhinorrhea, sinus pressure, productive cough with clear sputum x 2 wks.  Pt states symptoms seemed to improve some, but returned.  Has mild nausea with certain smells. Feels weak, tired, increased symptoms at night.  The cough at night keeps her up. Initially had a ST, but has resolved.  More of a dry throat/scratchy.   Denies HAs, dizziness, pain, loss of taste or smell, diarrhea. Using advair and flonase which help some.   Pt tried Coricidin HBP, but it caused her to stagger.  States made her feel like she was drunk and she has not had a drink "since the 70s".  Pt had f/u with Cardiology.  Taking lasix 20 mg in am and 40 mg in evening.  Pt now weighing herself daily.  Notes improvement in LE edema and weight loss.  Now 210.6 pounds.  ROS: See pertinent positives and negatives per HPI.  Past Medical History:  Diagnosis Date   Allergy    Anemia    Arthritis    Asthma    Back pain, chronic    GETS INJECTIONS IN BACK   Blind right eye    hemorrhage   Blood transfusion    Cancer ([place])    breast 1994   Clotting disorder ([place])    Diabetes mellitus without  complication ([place])    takes precose   Elevated liver enzymes    Gallstones    GERD (gastroesophageal reflux disease)    HX: breast cancer    Hyperlipidemia    Hypertension    Hypothyroid    Lymphedema of arm    RT   Morbid obesity ([facility])    OSA (obstructive sleep apnea)    has not used in 2 years-lost weight   Osteoporosis    Peripheral neuropathy    on gabapentin   PONV (postoperative nausea and vomiting)    Psoriasis    Pulmonary embolism ([place]) 1998 / 1994 /1968   Syncope and collapse 2011   due to anemia    Past Surgical History:  Procedure Laterality Date   ABDOMINAL HYSTERECTOMY     BONE MARROW TRANSPLANT  1994   CARDIOVASCULAR STRESS TEST  05/23/2005   EF 53%   carpal tunnell     bil   cataracts     CHOLECYSTECTOMY  05/10/2012   Procedure: LAPAROSCOPIC CHOLECYSTECTOMY WITH INTRAOPERATIVE CHOLANGIOGRAM;  Surgeon: Pedro Earls, MD;  Location: [facility];  Service: General;  Laterality: N/A;  Laparoscopic Cholecystectomy with Intraoperative Cholangiogram   GASTRIC BYPASS  2011   bariatric surgery   HIATAL HERNIA REPAIR     IVC filter     recurrent DVT   KNEE ARTHROTOMY  1998   MASS EXCISION Right 11/24/2016  Procedure: EXCISION RIGHT AXILLARY LESION AND CHEST WALL LESION;  Surgeon: Johnathan Hausen, MD;  Location: [facility];  Service: General;  Laterality: Right;   MASTECTOMY     Right   US ECHOCARDIOGRAPHY  10/27/08   EF 55-60%    Family History  Problem Relation Age of Onset   Sudden death Mother        car accident   Cancer Father 68       lung   Cancer Sister 5       lung cancer   Asthma Daughter    Current Outpatient Medications:    acarbose (PRECOSE) 50 MG tablet, 1 tablet at start of breakfast and lunch and half tablet at dinnertime, Disp: 225 tablet, Rfl: 2   ADVAIR DISKUS 100-50 MCG/ACT AEPB, USE 1 INHALATION EVERY 12 HOURS, RINSE MOUTH AFTER USING, Disp: 180 each, Rfl: 0   albuterol (PROAIR HFA) 108 (90 Base) MCG/ACT inhaler,  Inhale 2 puffs into the lungs every 6 (six) hours as needed for wheezing or shortness of breath., Disp: 24 g, Rfl: 2   azelastine (ASTELIN) 0.1 % nasal spray, USE 1 TO 2 SPRAYS IN EACH NOSTRIL TWICE A DAY AS NEEDED, Disp: 90 mL, Rfl: 3   baclofen (LIORESAL) 20 MG tablet, TAKE 1 TABLET THREE TIMES A DAY (UPDATED PRESCRIPTION), Disp: 90 tablet, Rfl: 0   Blood Glucose Monitoring Suppl (FREESTYLE FREEDOM LITE) w/Device KIT, Use as directed to check blood sugar once daily., Disp: 1 kit, Rfl: 0   Calcium Carbonate-Vitamin D 600-400 MG-UNIT tablet, Take 1 tablet by mouth daily., Disp: , Rfl:    clobetasol cream (TEMOVATE) 2.68 %, APPLY 1 APPLICATION TOPICALLY TWICE A DAY, Disp: 120 g, Rfl: 5   diazepam (DIASTAT ACUDIAL) 10 MG GEL, Place 5 mg rectally once as needed (cramping)., Disp: 2 Package, Rfl: 0   ferrous sulfate 325 (65 FE) MG tablet, Take 325 mg by mouth daily., Disp: , Rfl:    fluticasone (FLONASE) 50 MCG/ACT nasal spray, USE 2 SPRAYS IN EACH NOSTRIL DAILY, Disp: 48 g, Rfl: 3   FREESTYLE LITE test strip, USE AS INSTRUCTED TO CHECK BLOOD SUGAR ONCE DAILY, Disp: 100 strip, Rfl: 3   furosemide (LASIX) 20 MG tablet, Take 2 tablets (40 mg total) by mouth daily. MAY TAKE ADDITIONAL DOSE FOR A TOTAL OF 59m DAILY FOR WEIGHT GAIN OF 3 POUNDS OVERNIGHT OR 5 POUNDS IN ONE WEEK., Disp: 180 tablet, Rfl: 2   gabapentin (NEURONTIN) 600 MG tablet, TAKE 1 TABLET THREE TIMES A DAY (DOSE CHANGE), Disp: 90 tablet, Rfl: 0   HYDROcodone-acetaminophen (NORCO) 5-325 MG tablet, Take 1 tablet by mouth 2 (two) times daily as needed for moderate pain. (Patient taking differently: Take 0.5-1 tablets by mouth 3 (three) times daily as needed for moderate pain or severe pain.), Disp: 50 tablet, Rfl: 0   Lancets (FREESTYLE) lancets, USE AS INSTRUCTED TO CHECK BLOOD SUGAR ONCE DAILY, Disp: 100 each, Rfl: 1   levothyroxine (SYNTHROID) 100 MCG tablet, Take 1 tablet (100 mcg total) by mouth daily before breakfast., Disp: 90 tablet,  Rfl: 1   Magnesium Cl-Calcium Carbonate (SLOW-MAG PO), Take 500 mg by mouth at bedtime. , Disp: , Rfl:    methylcellulose (ARTIFICIAL TEARS) 1 % ophthalmic solution, Place 1 drop into both eyes as needed. Dry eyes, Disp: , Rfl:    omeprazole (PRILOSEC) 20 MG capsule, TAKE 1 CAPSULE DAILY, Disp: 90 capsule, Rfl: 3   potassium chloride SA (KLOR-CON M) 20 MEQ tablet, Take 2 tablets (  40 mEq total) by mouth daily., Disp: 180 tablet, Rfl: 2   pravastatin (PRAVACHOL) 10 MG tablet, Take 1 tablet (10 mg total) by mouth daily., Disp: 90 tablet, Rfl: 3   spironolactone (ALDACTONE) 100 MG tablet, Take 1 tablet (100 mg total) by mouth daily., Disp: 30 tablet, Rfl: 3   temazepam (RESTORIL) 15 MG capsule, 1-2 for sleep as needed (Patient taking differently: Take 15-30 mg by mouth at bedtime as needed for sleep. 1-2 for sleep as needed), Disp: 90 capsule, Rfl: 1   vitamin B-12 (CYANOCOBALAMIN) 500 MCG tablet, Take 500 mcg by mouth daily., Disp: , Rfl:    warfarin (COUMADIN) 5 MG tablet, Please take 2 tablets daily except take 1 tablet on Wed or Take as directed by anticoagulation clinic, Disp: 205 tablet, Rfl: 3  EXAM:  VITALS per patient if applicable: RR between 41-93 bpm, wt 210.6 lbs  GENERAL: alert, oriented, appears well and in no acute distress  HEENT: atraumatic, conjunctiva clear, no obvious abnormalities on inspection of external nose and ears  NECK: normal movements of the head and neck  LUNGS: intermittent cough.  on inspection no signs of respiratory distress, breathing rate appears normal, no obvious gross SOB, gasping or wheezing  CV: no obvious cyanosis  MS: moves all visible extremities without noticeable abnormality  PSYCH/NEURO: pleasant and cooperative, no obvious depression or anxiety, speech and thought processing grossly intact  ASSESSMENT AND PLAN:  Discussed the following assessment and plan:  Other acute sinusitis, recurrence not specified  -continue supportive care such  as rest, hydration, warm fluids, and steam. -start abx and tessalon - Plan: amoxicillin-clavulanate (AUGMENTIN) 500-125 MG tablet, benzonatate (TESSALON) 100 MG capsule  Bilateral lower extremity edema -Improving -Continue Lasix 20 mg in a.m. and 40 mg in p.m. -Continue other supportive care including elevating LEs, decreasing sodium intake, TED hose/compression socks or Ace wraps -Continue follow-up cardiology as needed  F/u prn   I discussed the assessment and treatment plan with the patient. The patient was provided an opportunity to ask questions and all were answered. The patient agreed with the plan and demonstrated an understanding of the instructions.   The patient was advised to call back or seek an in-person evaluation if the symptoms worsen or if the condition fails to improve as anticipated.   Billie Ruddy, MD

## 2021-09-01 ENCOUNTER — Encounter: Payer: Self-pay | Admitting: Endocrinology

## 2021-09-01 ENCOUNTER — Other Ambulatory Visit: Payer: Self-pay

## 2021-09-01 ENCOUNTER — Ambulatory Visit (INDEPENDENT_AMBULATORY_CARE_PROVIDER_SITE_OTHER): Payer: Medicare Other | Admitting: Endocrinology

## 2021-09-01 VITALS — BP 112/62 | HR 70 | Ht 67.5 in | Wt 213.2 lb

## 2021-09-01 DIAGNOSIS — E1142 Type 2 diabetes mellitus with diabetic polyneuropathy: Secondary | ICD-10-CM | POA: Diagnosis not present

## 2021-09-01 DIAGNOSIS — E063 Autoimmune thyroiditis: Secondary | ICD-10-CM

## 2021-09-01 DIAGNOSIS — R600 Localized edema: Secondary | ICD-10-CM | POA: Diagnosis not present

## 2021-09-01 DIAGNOSIS — E7800 Pure hypercholesterolemia, unspecified: Secondary | ICD-10-CM | POA: Diagnosis not present

## 2021-09-01 DIAGNOSIS — K912 Postsurgical malabsorption, not elsewhere classified: Secondary | ICD-10-CM

## 2021-09-01 NOTE — Progress Notes (Signed)
Patient ID: Walida Cajas, female   DOB: 1946/02/12, 76 y.o.   MRN: [id number]    Chief complaint: Followup of various chronic problems    History of Present Illness:  She is here for follow-up:  1.  Postprandial hypoglycemia related to dumping syndrome   She has had postprandial hypoglycemia starting a couple of years after her gastric bypass surgery Her symptoms of blood sugars are mostly a feeling of significant weakness, some shakiness.  She will use glucose tablets or juice in order to relieve the symptoms, recently she thinks that 1 or 2 glucose tablets are usually adequate She has been to the dietitian and has been instructed on balanced low-fat meals with enough protein consistently and restricting carbohydrates including fruits   Recent history:  She has been treated with acarbose 25 mg before meals She did try verapamil previously without benefit  She is taking her Precose as directed before meals, taking 2 before breakfast, 2 tablet at lunch and 1 at dinnertime  Appears to be still getting episodes of low blood sugar with symptoms of shakiness However now mostly getting low blood sugars when she is usually late for lunch or skipping dinner  She now says that she may get more low sugar episodes late afternoon and before supper She has 2 documented low blood sugars in the afternoon from missing lunch Also had a low sugar of about 48 for skipping her evening meal Frequently for breakfast she does she will have yogurt which is not high in protein and no other protein in the morning  Sometimes will have boost in the morning Fasting readings are fairly good and no overnight hypoglycemia  Metformin was stopped previously because of hypoglycemia tendency   2.  SWELLING of the legs:  She has had significant problems with her legs and feet chronically She has been followed by PCP and by cardiologist  Taking Aldactone 50 mg twice daily  Also taking  20 mg Lasix daily; gets cramping with higher doses but has been told by cardiologist to take more when she gains weight  Usually she does not like to wear compression stockings because of discomfort and difficulty putting them on  Renal function and potassium are consistently normal  Lab Results  Component Value Date   K 4.4 08/29/2021   Lab Results  Component Value Date   CREATININE 1.13 08/29/2021   CREATININE 0.85 07/08/2021   CREATININE 0.98 04/25/2021     PROBLEM 3:  DIABETES:  This has been mild and well controlled since her gastric bypass surgery  The A1c usually higher than expected for her blood sugars, last 6.9 and now 6.6  Current management, blood sugar patterns: She has only been on acarbose for treatment to prevent hypoglycemia  Blood sugar readings are excellent at home, highest reading 153 after late lunch Recently fasting readings are not above 100 Lab glucose fasting was 98 She tries to do some chair exercises or walking but her weight has not consistently improved  She was previously told to stop her Metformin  because of tendency to hypoglycemia symptoms  Glucose readings from monitor download:    PRE-MEAL Fasting Lunch Dinner Bedtime Overall  Glucose range: 88-99 64, 68 77, 82 48   Mean/median:     90   POST-MEAL PC Breakfast PC Lunch PC Dinner  Glucose range:  153 115  Mean/median:         Wt Readings from Last 3 Encounters:  09/01/21 213 lb 3.2  oz (96.7 kg)  08/31/21 210 lb 9.6 oz (95.5 kg)  08/16/21 227 lb (103 kg)    Lab Results  Component Value Date   HGBA1C 6.6 (H) 08/29/2021   HGBA1C 6.7 (H) 04/25/2021   HGBA1C 6.9 (H) 11/23/2020   Lab Results  Component Value Date   MICROALBUR <0.7 04/26/2021   [place] 90 08/29/2021   CREATININE 1.13 08/29/2021      Other active problems: see review of systems     Allergies as of 09/01/2021       Reactions   Adhesive [tape] Rash        Medication List        Accurate as of  September 01, 2021 10:17 AM. If you have any questions, ask your nurse or doctor.          acarbose 50 MG tablet Commonly known as: PRECOSE 1 tablet at start of breakfast and lunch and half tablet at dinnertime   Advair Diskus 100-50 MCG/ACT Aepb Generic drug: fluticasone-salmeterol USE 1 INHALATION EVERY 12 HOURS, RINSE MOUTH AFTER USING   albuterol 108 (90 Base) MCG/ACT inhaler Commonly known as: ProAir HFA Inhale 2 puffs into the lungs every 6 (six) hours as needed for wheezing or shortness of breath.   amoxicillin-clavulanate 500-125 MG tablet Commonly known as: Augmentin Take 1 tablet (500 mg total) by mouth in the morning and at bedtime for 7 days.   azelastine 0.1 % nasal spray Commonly known as: ASTELIN USE 1 TO 2 SPRAYS IN EACH NOSTRIL TWICE A DAY AS NEEDED   baclofen 20 MG tablet Commonly known as: LIORESAL TAKE 1 TABLET THREE TIMES A DAY (UPDATED PRESCRIPTION)   benzonatate 100 MG capsule Commonly known as: TESSALON Take 1 capsule (100 mg total) by mouth 2 (two) times daily as needed for cough.   Calcium Carbonate-Vitamin D 600-400 MG-UNIT tablet Take 1 tablet by mouth daily.   clobetasol cream 0.05 % Commonly known as: TEMOVATE APPLY 1 APPLICATION TOPICALLY TWICE A DAY   diazepam 10 MG Gel Commonly known as: DIASTAT ACUDIAL Place 5 mg rectally once as needed (cramping).   ferrous sulfate 325 (65 FE) MG tablet Take 325 mg by mouth daily.   fluticasone 50 MCG/ACT nasal spray Commonly known as: FLONASE USE 2 SPRAYS IN EACH NOSTRIL DAILY   FreeStyle Freedom Lite w/Device Kit Use as directed to check blood sugar once daily.   freestyle lancets USE AS INSTRUCTED TO CHECK BLOOD SUGAR ONCE DAILY   FREESTYLE LITE test strip Generic drug: glucose blood USE AS INSTRUCTED TO CHECK BLOOD SUGAR ONCE DAILY   furosemide 20 MG tablet Commonly known as: LASIX Take 2 tablets (40 mg total) by mouth daily. MAY TAKE ADDITIONAL DOSE FOR A TOTAL OF 14m DAILY FOR  WEIGHT GAIN OF 3 POUNDS OVERNIGHT OR 5 POUNDS IN ONE WEEK. What changed: how much to take   gabapentin 600 MG tablet Commonly known as: NEURONTIN TAKE 1 TABLET THREE TIMES A DAY (DOSE CHANGE)   HYDROcodone-acetaminophen 5-325 MG tablet Commonly known as: Norco Take 1 tablet by mouth 2 (two) times daily as needed for moderate pain. What changed:  how much to take when to take this reasons to take this   levothyroxine 100 MCG tablet Commonly known as: Synthroid Take 1 tablet (100 mcg total) by mouth daily before breakfast.   methylcellulose 1 % ophthalmic solution Commonly known as: ARTIFICIAL TEARS Place 1 drop into both eyes as needed. Dry eyes   omeprazole 20 MG capsule Commonly known  as: PRILOSEC TAKE 1 CAPSULE DAILY   potassium chloride SA 20 MEQ tablet Commonly known as: KLOR-CON M Take 2 tablets (40 mEq total) by mouth daily.   pravastatin 10 MG tablet Commonly known as: PRAVACHOL Take 1 tablet (10 mg total) by mouth daily.   SLOW-MAG PO Take 500 mg by mouth at bedtime.   spironolactone 100 MG tablet Commonly known as: ALDACTONE Take 1 tablet (100 mg total) by mouth daily.   temazepam 15 MG capsule Commonly known as: RESTORIL 1-2 for sleep as needed What changed:  how much to take how to take this when to take this reasons to take this   vitamin B-12 500 MCG tablet Commonly known as: CYANOCOBALAMIN Take 500 mcg by mouth daily.   warfarin 5 MG tablet Commonly known as: COUMADIN Take as directed by the anticoagulation clinic. If you are unsure how to take this medication, talk to your nurse or doctor. Original instructions: Please take 2 tablets daily except take 1 tablet on Wed or Take as directed by anticoagulation clinic        Allergies:  Allergies  Allergen Reactions   Adhesive [Tape] Rash    Past Medical History:  Diagnosis Date   Allergy    Anemia    Arthritis    Asthma    Back pain, chronic    GETS INJECTIONS IN BACK   Blind  right eye    hemorrhage   Blood transfusion    Cancer ([place])    breast 1994   Clotting disorder ([place])    Diabetes mellitus without complication ([place])    takes precose   Elevated liver enzymes    Gallstones    GERD (gastroesophageal reflux disease)    HX: breast cancer    Hyperlipidemia    Hypertension    Hypothyroid    Lymphedema of arm    RT   Morbid obesity ([facility])    OSA (obstructive sleep apnea)    has not used in 2 years-lost weight   Osteoporosis    Peripheral neuropathy    on gabapentin   PONV (postoperative nausea and vomiting)    Psoriasis    Pulmonary embolism ([place]) 1998 / 1994 /1968   Syncope and collapse 2011   due to anemia    Past Surgical History:  Procedure Laterality Date   ABDOMINAL HYSTERECTOMY     BONE MARROW TRANSPLANT  1994   CARDIOVASCULAR STRESS TEST  05/23/2005   EF 53%   carpal tunnell     bil   cataracts     CHOLECYSTECTOMY  05/10/2012   Procedure: LAPAROSCOPIC CHOLECYSTECTOMY WITH INTRAOPERATIVE CHOLANGIOGRAM;  Surgeon: Pedro Earls, MD;  Location: [facility];  Service: General;  Laterality: N/A;  Laparoscopic Cholecystectomy with Intraoperative Cholangiogram   GASTRIC BYPASS  2011   bariatric surgery   HIATAL HERNIA REPAIR     IVC filter     recurrent DVT   KNEE ARTHROTOMY  1998   MASS EXCISION Right 11/24/2016   Procedure: EXCISION RIGHT AXILLARY LESION AND CHEST WALL LESION;  Surgeon: Johnathan Hausen, MD;  Location: [facility];  Service: General;  Laterality: Right;   MASTECTOMY     Right   US ECHOCARDIOGRAPHY  10/27/08   EF 55-60%    Family History  Problem Relation Age of Onset   Sudden death Mother        car accident   Cancer Father 35       lung   Cancer Sister 61  lung cancer   Asthma Daughter     Social History:  reports that she quit smoking about 43 years ago. Her smoking use included cigarettes. She has a 5.00 pack-year smoking history. She has never used smokeless tobacco. She reports that she  does not drink alcohol and does not use drugs.  Review of Systems    ABNORMAL liver functions:  ALT is high again but only slightly  She has had abnormal high ALT readings before transiently and no etiology found Not related to statins  Lab Results  Component Value Date   ALT 33 08/29/2021   ALT 25 01/19/2016     Neuropathy with persistent burning, mild numbness and lower leg pain:  She gets relief with nortriptyline and gabapentin  MUSCLE cramps/spasms:  These are treated with 10 mg baclofen during the day twice daily and 20 mg at bedtime  She also uses tonic water, mustard and magnesium supplements  No history of hypokalemia  Lymphedema right arm, secondary to  breast surgery, using  elastic compression stocking but not getting enough relief   History of mild hypothyroidism: Has been present for several years and minimally symptomatic usually  Current dose of levothyroxine is 100 mcg  She tends to have fatigue chronically TSH history:   Lab Results  Component Value Date   TSH 2.23 08/29/2021   TSH 1.90 07/08/2021   TSH 2.83 04/25/2021   FREET4 1.05 07/08/2021   FREET4 0.92 01/21/2021   FREET4 0.83 07/19/2020     Hypercholesterolemia: LDL has been at target with pravastatin 64m  Labs as below  Lipid levels:  Lab Results  Component Value Date   CHOL 193 08/29/2021   HDL 92.70 08/29/2021   LDLCALC 90 08/29/2021   TRIG 52.0 08/29/2021   CHOLHDL 2 08/29/2021   Lab Results  Component Value Date   ALT 33 08/29/2021   ALT 25 01/19/2016    No history of hypertension  BP Readings from Last 3 Encounters:  09/01/21 112/62  08/16/21 116/68  08/15/21 118/68     LABS:  Lab on 08/29/2021  Component Date Value Ref Range Status   Cholesterol 08/29/2021 193  0 - 200 mg/dL Final   ATP III Classification       Desirable:  < 200 mg/dL               Borderline High:  200 - 239 mg/dL          High:  > = 240 mg/dL   Triglycerides 08/29/2021 52.0  0.0 -  149.0 mg/dL Final   Normal:  <150 mg/dLBorderline High:  150 - 199 mg/dL   HDL 08/29/2021 92.70  >39.00 mg/dL Final   VLDL 08/29/2021 10.4  0.0 - 40.0 mg/dL Final   LDL Cholesterol 08/29/2021 90  0 - 99 mg/dL Final   Total CHOL/HDL Ratio 08/29/2021 2   Final                  Men          Women1/2 Average Risk     3.4          3.3Average Risk          5.0          4.42X Average Risk          9.6          7.13X Average Risk          15.0  11.0                       NonHDL 08/29/2021 100.11   Final   NOTE:  Non-HDL goal should be 30 mg/dL higher than patient's LDL goal (i.e. LDL goal of < 70 mg/dL, would have non-HDL goal of < 100 mg/dL)   TSH 08/29/2021 2.23  0.35 - 5.50 uIU/mL Final   Sodium 08/29/2021 139  135 - 145 mEq/L Final   Potassium 08/29/2021 4.4  3.5 - 5.1 mEq/L Final   Chloride 08/29/2021 99  96 - 112 mEq/L Final   CO2 08/29/2021 28  19 - 32 mEq/L Final   Glucose, Bld 08/29/2021 98  70 - 99 mg/dL Final   BUN 08/29/2021 25 (H)  6 - 23 mg/dL Final   Creatinine, Ser 08/29/2021 1.13  0.40 - 1.20 mg/dL Final   Total Bilirubin 08/29/2021 0.4  0.2 - 1.2 mg/dL Final   Alkaline Phosphatase 08/29/2021 89  39 - 117 U/L Final   AST 08/29/2021 33  0 - 37 U/L Final   ALT 08/29/2021 33  0 - 35 U/L Final   Total Protein 08/29/2021 7.0  6.0 - 8.3 g/dL Final   Albumin 08/29/2021 4.2  3.5 - 5.2 g/dL Final   GFR 08/29/2021 48.19 (L)  >60.00 mL/min Final   Calculated using the CKD-EPI Creatinine Equation (2021)   Calcium 08/29/2021 9.5  8.4 - 10.5 mg/dL Final   Hgb A1c MFr Bld 08/29/2021 6.6 (H)  4.6 - 6.5 % Final   Glycemic Control Guidelines for People with Diabetes:Non Diabetic:  <6%Goal of Therapy: <7%Additional Action Suggested:  >8%   Appointment on 08/25/2021  Component Date Value Ref Range Status   Area-P 1/2 08/25/2021 3.65  cm2 Final   S' Lateral 08/25/2021 2.50  cm Final  Anti-coag visit on 08/18/2021  Component Date Value Ref Range Status   INR 08/18/2021 2.5  2.0 - 3.0  Final     EXAM:  BP 112/62    Pulse 70    Ht 5' 7.5" (1.715 m)    Wt 213 lb 3.2 oz (96.7 kg)    BMI 32.90 kg/m      Assessment/Plan:   1.  HYPOTHYROIDISM: TSH is better with additional 88 mcg of Synthroid weekly  She will switch to the 100 mcg dose when 88 mcg supply for next, this should be equivalent to current dose which is improving her TSH back to normal   2. Reactive hypoglycemia secondary to gastrointestinal surgery:   She is still getting periodic hypoglycemia and may be symptomatic, again more when she is late for lunch Discussed needing to not go too long between meals, add more protein to breakfast and at least have a small snack in the evenings before bedtime if not eating much at dinnertime Continue acarbose with each meal as before  She needs to make sure that she taking the acarbose at the start of the meal/  3. Mild diabetes: Not on treatment  As before blood sugars are generally near normal with highest reading only 153 A1c higher than expected for overall Home blood sugar records  4.  Increased ALT: This is not consistent as before and may be related to fatty liver  5.  Chronic venous edema: Now followed by cardiologist: Even though she has a high BNP she does not have abnormal echocardiogram  6.:  Lipids well controlled with LDL 90  There are no Patient Instructions on file for this  visit.  Elayne Snare 09/01/2021, 10:17 AM

## 2021-09-01 NOTE — Patient Instructions (Signed)
Add a protein to yogurt in am  Make sure you have bedtime snack if skipping dinner

## 2021-09-11 NOTE — Progress Notes (Signed)
Subjective:    Patient ID: Kelli Martinez, female    DOB: July 05, 1946, 76 y.o.   MRN: [id number]  HPI F former smoker followed for OSA/ quit CPAP, allergic rhinitis,asthma,  hx pulm embolism/DVT/filter/coumadin(Dr Dwyane Dee), hx R breast Ca/ mastectomy/ xrt/chem/bonemarrow transplant at [facility], hx gastric bypass, DM 2 Office Spirometry 04/27/2015-WNL-FEV1/FVC 0.81, FEV1 2.50/110% HST- 12/31/19- AHI 9.1/ hr, desaturation to 88%, body weight 219 lbs ----------------------------------------------------------------------------------------   09/23/20- 74 yoF former smoker followed for Allergic Rhinitis, Asthma, OSA/ quit CPAP, insomnia,   hx pulm embolism/DVT/filter/coumadin(Dr Dwyane Dee), hx R breast Ca/ mastectomy/ xrt/chem/bone marrow transplant at [place], hx gastric bypass, DM 2, Chronic peripheral Edema Dr Dwyane Dee manages peripheral edema. -Albuterol hfa, Advair 100,  Body weight today-215 lbs Covid vax- 3 Moderna Flu vax-had ACT score 14 Wakes every few hours- back pain and cramps, frequent position changes. Temazepam does help. Continues Advair, using rescue hfa 2x/ week with no recent exacerbation.  We discussed sleep study and option not to treat  mild OSA.  09/13/21- 70 yoF former smoker followed for Allergic Rhinitis, Asthma, OSA/ quit CPAP, insomnia,   hx pulm embolism/DVT/filter/coumadin(Dr Dwyane Dee), hx R breast Ca/ mastectomy/ xrt/chem/bone marrow transplant at [place], hx gastric bypass, DM 2, Chronic peripheral Edema Dr Dwyane Dee manages peripheral edema. -Albuterol hfa, Advair 100,  Body weight today- Covid vax- 3 Moderna Flu vax-had -----Patient has a cough and sinus drainage. Productive cough with clear sputum. Sinuses were clear on head CT in December. Daughter here. Bothersome postnasal drip and cough with clear mucus x 2 weeks. No evident infection. Using Flonase. Needs inhalers refilled. Minimizes antihistamine due to dry eyes. Willing to tolerate blood sugar rise for steroid inj. Consider  ipratropium nasal spray next. CXR 07/08/21- FINDINGS: Normal heart size. Mediastinal contours are unremarkable. No signs of pleural effusion or edema. No airspace opacities identified. Surgical clips noted within the right axilla. IMPRESSION: No active cardiopulmonary abnormalities.   Review of Systems- see HPI + = positive Constitutional:   No-   weight loss, night sweats, fevers, chills, fatigue, lassitude. HEENT:   No-  headaches, difficulty swallowing, tooth/dental problems, sore throat,   +dry mouth      No-  sneezing, itching, ear ache,  +nasal congestion, +post nasal drip, +blind R eye CV:  No-   chest pain, orthopnea, PND, swelling in lower extremities, anasarca, dizziness, palpitations Resp: No-   shortness of breath with exertion or at rest.              No-   productive cough,   non-productive cough,  No-  coughing up of blood.              No-   change in color of mucus.   Skin: No-   rash or lesions. GI:  No-   heartburn, indigestion, abdominal pain, nausea, vomiting,  GU:  MS:  No-   joint pain,    swelling.   Neuro- : +HPI Psych:  No- change in mood or affect. No depression or anxiety.  No memory loss.   Objective:   Physical Exam General- Alert, Oriented, Affect-appropriate, Distress- none acute  + obese( most of weight is in her legs and hips) Skin- rash-none, lesions- none, excoriation- none. XRT skin changes right lateral neck Lymphadenopathy- none Head- atraumatic            Eyes- +blind R eye,  clear conjunctivae            Ears- Hearing, canals, TMs normal  Nose- Clear, no- Septal dev, + sticky mucus, polyps, erosion, perforation             Throat- Mallampati III , mucosa clear , drainage- none, tonsils- atrophic, +missing teeth Neck- flexible , trachea midline, no stridor , thyroid nl, carotid no bruit Chest - symmetrical excursion , unlabored           Heart/CV- RRR , no murmur , no gallop  , no rub, nl s1 s2                           - JVD+1,  edema+2, stasis changes- , varices- none           Lung- clear to P&A, wheeze-none, cough- none , dullness-none, rub- none           Chest wall- + right mastectomy Abd-  Br/ Gen/ Rectal- Not done, not indicated Extrem- cyanosis- none, clubbing- none, atrophy- none, strength- nl   Heavy legs. + cane Neuro- grossly intact to observation  Assessment & Plan:

## 2021-09-13 ENCOUNTER — Ambulatory Visit (INDEPENDENT_AMBULATORY_CARE_PROVIDER_SITE_OTHER): Payer: Medicare Other | Admitting: Internal Medicine

## 2021-09-13 ENCOUNTER — Other Ambulatory Visit: Payer: Self-pay

## 2021-09-13 ENCOUNTER — Encounter: Payer: Self-pay | Admitting: Internal Medicine

## 2021-09-13 DIAGNOSIS — J3089 Other allergic rhinitis: Secondary | ICD-10-CM

## 2021-09-13 DIAGNOSIS — J302 Other seasonal allergic rhinitis: Secondary | ICD-10-CM

## 2021-09-13 DIAGNOSIS — J4530 Mild persistent asthma, uncomplicated: Secondary | ICD-10-CM

## 2021-09-13 MED ORDER — LORATADINE 5 MG/5ML PO SYRP: 5.0000 mg | ORAL_SOLUTION | Freq: Every day | ORAL | 12 refills | Status: DC

## 2021-09-13 MED ORDER — METHYLPREDNISOLONE ACETATE 80 MG/ML IJ SUSP
80.0000 mg | Freq: Once | INTRAMUSCULAR | Status: AC
  Administered 2021-09-13: 80 mg via INTRAMUSCULAR

## 2021-09-13 MED ORDER — FLUTICASONE-SALMETEROL 100-50 MCG/ACT IN AEPB: INHALATION_SPRAY | RESPIRATORY_TRACT | 3 refills | Status: DC

## 2021-09-13 MED ORDER — FLUTICASONE PROPIONATE 50 MCG/ACT NA SUSP: 2.0000 | Freq: Every day | NASAL | 3 refills | Status: DC

## 2021-09-13 MED ORDER — ALBUTEROL SULFATE HFA 108 (90 BASE) MCG/ACT IN AERS: 2.0000 | INHALATION_SPRAY | Freq: Four times a day (QID) | RESPIRATORY_TRACT | 3 refills | Status: DC | PRN

## 2021-09-13 NOTE — Assessment & Plan Note (Signed)
Mild exacerbation, mainly cough. No evident infection- may be spring pollen starting. Plan- depo 80, refill inhalers

## 2021-09-13 NOTE — Patient Instructions (Signed)
Order- depo 80    dx seasonal and perennial allergic rhinitis  Script sent to [facility] for Claritin antihistamine syrup  Script sent to [facility] for your inhalers

## 2021-09-13 NOTE — Assessment & Plan Note (Signed)
Likely early seasonal flare Plan- continue flonase. Dry low dose claritin syrup if tolerated by dry eyes.

## 2021-09-14 DIAGNOSIS — L603 Nail dystrophy: Secondary | ICD-10-CM | POA: Diagnosis not present

## 2021-09-14 DIAGNOSIS — E1151 Type 2 diabetes mellitus with diabetic peripheral angiopathy without gangrene: Secondary | ICD-10-CM | POA: Diagnosis not present

## 2021-09-14 DIAGNOSIS — I739 Peripheral vascular disease, unspecified: Secondary | ICD-10-CM | POA: Diagnosis not present

## 2021-09-14 DIAGNOSIS — L84 Corns and callosities: Secondary | ICD-10-CM | POA: Diagnosis not present

## 2021-09-15 ENCOUNTER — Ambulatory Visit: Payer: Self-pay

## 2021-09-15 ENCOUNTER — Encounter: Payer: Self-pay | Admitting: Family Medicine

## 2021-09-15 ENCOUNTER — Ambulatory Visit (INDEPENDENT_AMBULATORY_CARE_PROVIDER_SITE_OTHER): Payer: Medicare Other | Admitting: Family Medicine

## 2021-09-15 VITALS — BP 120/64 | HR 61 | Temp 97.8°F | Wt 211.0 lb

## 2021-09-15 DIAGNOSIS — I872 Venous insufficiency (chronic) (peripheral): Secondary | ICD-10-CM | POA: Diagnosis not present

## 2021-09-15 DIAGNOSIS — Z7901 Long term (current) use of anticoagulants: Secondary | ICD-10-CM | POA: Diagnosis not present

## 2021-09-15 DIAGNOSIS — I5032 Chronic diastolic (congestive) heart failure: Secondary | ICD-10-CM

## 2021-09-15 DIAGNOSIS — G4709 Other insomnia: Secondary | ICD-10-CM

## 2021-09-15 DIAGNOSIS — R252 Cramp and spasm: Secondary | ICD-10-CM | POA: Diagnosis not present

## 2021-09-15 LAB — POCT INR: INR: 2.6 (ref 2.0–3.0)

## 2021-09-15 NOTE — Progress Notes (Signed)
Continue to take 2 tablets daily except 1 tablet on Wed and Saturdays.  Re-check in 4 weeks.  

## 2021-09-15 NOTE — Patient Instructions (Signed)
Your INR was 2.6 this visit.  Continue current dosing.  We discussed taking your lasix doses earlier so that you are up less at night.  You can take an additional lasix pill today (for a total dose of 60 mg or 3 tabs) when you get home to help with your current swelling.  Take an extra potassium supplement with this.   We will work on getting you into the lymphedema clinic.

## 2021-09-15 NOTE — Progress Notes (Signed)
Subjective:    Patient ID: Kelli Martinez, female    DOB: [birth year], 76 y.o.   MRN: [id number]  Chief Complaint  Patient presents with   Follow-up  Pt accompanied by her daughter, Ivin Booty.  HPI Patient was seen today for f/u.  Pt states she noticed improvement in LE edema, but developed increased edema yesterday after she was up cooking.  Pt has yet to take her lasiz this am.  Taking 2nd dose of lasix at night.  Up urinating several times during the night.  Pt endorses insomnia.  States may not sleep the night before appts as she does not want to miss them.  Pt no longer has an alarm clock since retiring.  Has an appointment with cardiology, Dr. Harl Bowie tomorrow.  Patient had recent appointments with pulmonology and endocrinology.  Pt inquires if she can get her INR checked.  Past Medical History:  Diagnosis Date   Allergy    Anemia    Arthritis    Asthma    Back pain, chronic    GETS INJECTIONS IN BACK   Blind right eye    hemorrhage   Blood transfusion    Cancer ([place])    breast 1994   Clotting disorder ([place])    Diabetes mellitus without complication ([place])    takes precose   Elevated liver enzymes    Gallstones    GERD (gastroesophageal reflux disease)    HX: breast cancer    Hyperlipidemia    Hypertension    Hypothyroid    Lymphedema of arm    RT   Morbid obesity (HCC)    OSA (obstructive sleep apnea)    has not used in 2 years-lost weight   Osteoporosis    Peripheral neuropathy    on gabapentin   PONV (postoperative nausea and vomiting)    Psoriasis    Pulmonary embolism ([place]) 1998 / 1994 /1968   Syncope and collapse 2011   due to anemia    Allergies  Allergen Reactions   Adhesive [Tape] Rash    ROS General: Denies fever, chills, night sweats, changes in weight, changes in appetite HEENT: Denies headaches, ear pain, changes in vision, rhinorrhea, sore throat CV: Denies CP, palpitations, SOB, orthopnea  +LE edema Pulm: Denies SOB, cough,  wheezing GI: Denies abdominal pain, nausea, vomiting, diarrhea, constipation GU: Denies dysuria, hematuria, frequency, vaginal discharge Msk: Denies muscle cramps, joint pains  Neuro: Denies weakness, numbness, tingling Skin: Denies rashes, bruising Psych: Denies depression, anxiety, hallucinations     Objective:    Blood pressure 120/64, pulse 61, temperature 97.8 F (36.6 C), temperature source Oral, weight 211 lb (95.7 kg), SpO2 97 %.  Gen. Pleasant, well-nourished, in no distress, normal affect   HEENT: Atkins/AT, face symmetric, conjunctiva clear, no scleral icterus, PERRLA, EOMI, nares patent without drainage Lungs: no accessory muscle use, CTAB, no wheezes or rales Cardiovascular: RRR, no m/r/g, 1+ pedal edema with trace at ankles b/l, R>L Abdomen: BS present, soft, NT/ND, no hepatosplenomegaly. Musculoskeletal: No deformities, no cyanosis or clubbing, normal tone Neuro:  A&Ox3, CN II-XII intact, normal gait Skin:  Warm, no lesions/ rash   Wt Readings from Last 3 Encounters:  09/15/21 211 lb (95.7 kg)  09/13/21 210 lb 9.6 oz (95.5 kg)  09/01/21 213 lb 3.2 oz (96.7 kg)    Lab Results  Component Value Date   WBC 4.2 07/08/2021   HGB 12.1 07/08/2021   HCT 37.4 07/08/2021   PLT 170.0 07/08/2021   GLUCOSE 98 08/29/2021  CHOL 193 08/29/2021   TRIG 52.0 08/29/2021   HDL 92.70 08/29/2021   LDLCALC 90 08/29/2021   ALT 33 08/29/2021   AST 33 08/29/2021   NA 139 08/29/2021   K 4.4 08/29/2021   CL 99 08/29/2021   CREATININE 1.13 08/29/2021   BUN 25 (H) 08/29/2021   CO2 28 08/29/2021   TSH 2.23 08/29/2021   INR 2.5 08/18/2021   HGBA1C 6.6 (H) 08/29/2021   MICROALBUR <0.7 04/26/2021    Assessment/Plan:  Chronic anticoagulation  -2/2 h/o DVT/PE w/ chronic indwelling IVC filter in place since 2011 -INR 2.6 this visit -continue coumadin as directed - Plan: POC INR  Edema of both lower extremities due to peripheral venous insufficiency -improving -chronic lymphedema  and HFpEF also contributing -Discussed increasing afternoon dose based on weight gain, 3 lbs in a day or 5 lbs in a wk. -continue supportive care including elevation, compression socks, etc. -as pt has yet to take meds this am, encouraged to take lasix 60 mg today.  Muscle cramps -continue Kdur potassium supplement  Other insomnia -multifactorial.  Anxiety regarding making it to appts on time, nocturia 2/2 taking 2nd dose of lasix in evening, and age. -discussed getting an alarm clock -will have pt take doses of lasix earlier to decreased nocturia -sleep hygiene  Chronic heart failure with preserved ejection fraction (HCC) -stable -mild increase in edema.   -encouraged to take lasix doses earlier. -continue spironolactone 100 mg daily, lasix 40 mg daily--ok to take an extra dose if needed based on wt gain. -continue statin -not currently on BB -continue f/u with Cardiology  F/u in 2-3 months  Grier Mitts, MD

## 2021-09-15 NOTE — Patient Instructions (Addendum)
Pre visit review using our clinic review tool, if applicable. No additional management support is needed unless otherwise documented below in the visit note.  Continue to take 2 tablets daily except 1 tablet on Wed and Saturdays.  Re-check in 4 weeks.  

## 2021-09-16 ENCOUNTER — Other Ambulatory Visit: Payer: Self-pay

## 2021-09-16 ENCOUNTER — Encounter: Payer: Self-pay | Admitting: Internal Medicine

## 2021-09-16 ENCOUNTER — Ambulatory Visit (INDEPENDENT_AMBULATORY_CARE_PROVIDER_SITE_OTHER): Payer: Medicare Other | Admitting: Internal Medicine

## 2021-09-16 DIAGNOSIS — R252 Cramp and spasm: Secondary | ICD-10-CM | POA: Diagnosis not present

## 2021-09-16 DIAGNOSIS — I872 Venous insufficiency (chronic) (peripheral): Secondary | ICD-10-CM

## 2021-09-16 MED ORDER — POTASSIUM CHLORIDE CRYS ER 20 MEQ PO TBCR: 40.0000 meq | EXTENDED_RELEASE_TABLET | Freq: Two times a day (BID) | ORAL | 3 refills | Status: DC

## 2021-09-16 MED ORDER — FUROSEMIDE 80 MG PO TABS
80.0000 mg | ORAL_TABLET | Freq: Two times a day (BID) | ORAL | 3 refills | Status: DC
Start: 2021-09-16 — End: 2022-07-19

## 2021-09-16 NOTE — Patient Instructions (Signed)
Medication Instructions:  Your physician has recommended you make the following change in your medication:  INCREASE: Lasix 80 mg twice daily INCREASE: Potassium 40 mEq twice daily *If you need a refill on your cardiac medications before your next appointment, please call your pharmacy*   Lab Work: None If you have labs (blood work) drawn today and your tests are completely normal, you will receive your results only by: [place] (if you have MyChart) OR A paper copy in the mail If you have any lab test that is abnormal or we need to change your treatment, we will call you to review the results.   Testing/Procedures: None   Follow-Up: At [facility], you and your health needs are our priority.  As part of our continuing mission to provide you with exceptional heart care, we have created designated Provider Care Teams.  These Care Teams include your primary Cardiologist (physician) and Advanced Practice Providers (APPs -  Physician Assistants and Nurse Practitioners) who all work together to provide you with the care you need, when you need it.  We recommend signing up for the patient portal called "MyChart".  Sign up information is provided on this After Visit Summary.  MyChart is used to connect with patients for Virtual Visits (Telemedicine).  Patients are able to view lab/test results, encounter notes, upcoming appointments, etc.  Non-urgent messages can be sent to your provider as well.   To learn more about what you can do with MyChart, go to [email or web address].    Your next appointment:   3 month(s)  The format for your next appointment:   In Person  Provider:   Janina Mayo, MD

## 2021-09-16 NOTE — Progress Notes (Signed)
Cardiology Office Note:    Date:  09/16/2021   ID:  Kelli Martinez, DOB August 18, 1945, MRN [id number]  PCP:  Billie Ruddy, MD   [facility] Providers Cardiologist:  Janina Mayo, MD     Referring MD: Billie Ruddy, MD   No chief complaint on file. LE edema  History of Present Illness:    Kelli Martinez is a 76 y.o. female with a hx of DM2, GERD, HTN, OSA prescribed cpap, DVT/ recurrent PE s.p IVC filter (per patient since 2011) continued on coumadin, gastric bypass surgery, breast cancer ~ 30 years ago  s/p chemo (unknown) bone marrow transplant in remission referral from Dr. Volanda Napoleon regarding palpitations, LE edema and weight gain  Kelli Martinez reports that her legs have had increased swelling. She keeps her legs elevated at night. Its been about a year in a half that it has progressed. She was being seen by [facility]. She can get easily fatigued with daily activities. She sleeps with 2 pillows. She says lying more flat makes her feel bad/head hurts.  She says that walking to the kitchen and back she gets SOB.  She denies PND.  Her weight is up 3 pounds today from yesterday. No prior hx of CHF.   She saw Dr. Volanda Napoleon 07/08/2021 with concern for CHF. She was taking lasix 20 mg at night.  BNP was mildly elevated 223. Her echo is pending. Her chest xray showed no edema. Her lasix was increased from 20 mg to 40 mg daily started yesterday. She has normal renal function. She's on spironolactone takes 50 mg some days and 100 mg some days. Blood pressure is in good control.  She's had report of LE edema over the summer. She was taking lasix 20 mg with this. She's had right UE edema 2/2 [facility]a with hx of breast cancer.  Otherwise she's had significant leg cramping, statin was reduced previously.  She saw Dr. Martinique in 2012. He notes: She has remote history of syncope with normal cardiac evaluation in the past including echocardiogram, nuclear stress testing, and  tilt table testing. She has a history of recurrent pulmonary emboli, diabetes, and hypertension.  Interim Hx She still has significant LE edema. She sleeps on 2 pillows 2/2 being dizzy. No orthopnea. She is getting cortisone shots in her hip. She notes increased leg swelling with this. She is tracking her weights. Weights 202 to 208. She is peeing. She notes constipation. She has not been to the lymphadema clinic yet but is planning to. For lasix, she just increased to 80 mg daily.  Her renal function from 08/29/2021 was normal. K was normal.   Cardiology Studies  TTE 10/09/2019  1. Left ventricular ejection fraction by 3D volume is 57 %. The left  ventricle has normal function. The left ventricle has no regional wall motion abnormalities. Left ventricular diastolic parameters were normal. The average left ventricular global  longitudinal strain is -23.6 % (normal).   2. Right ventricular systolic function is normal. The right ventricular size is normal. There is normal pulmonary artery systolic pressure. The estimated right ventricular systolic pressure is 40.9 mmHg.   3. The mitral valve is abnormal. Mild mitral annular calcification.  Mitral valve leaflets demonstrate mild bowing in systole, without definite prolapse. Mild mitral valve regurgitation. No evidence of mitral stenosis.   4. The aortic valve is tricuspid. Aortic valve regurgitation is trivial.  Mild aortic valve sclerosis is present, with no evidence of aortic valve stenosis.  5. The inferior vena cava is normal in size with greater than 50%  respiratory variability, suggesting right atrial pressure of 3 mmHg.  TTE 08/25/2021 1. Left ventricular ejection fraction, by estimation, is 55 to 60%. The left ventricle has normal function. The left ventricle has no regional wall motion abnormalities. Left ventricular diastolic parameters were normal. The average left ventricular  global longitudinal strain is -22.9 %. The global  longitudinal strain is normal.   2. Right ventricular systolic function is normal. The right ventricular  size is normal. There is normal pulmonary artery systolic pressure. The estimated right ventricular systolic pressure is 49.7 mmHg.   3. The mitral valve is abnormal. Mild mitral valve regurgitation.  Moderate mitral annular calcification.   4. The aortic valve is tricuspid. There is moderate calcification of the aortic valve. There is moderate thickening of the aortic valve. Aortic valve regurgitation is not visualized. Aortic valve  sclerosis/calcification is present, without any evidence  of aortic stenosis.   5. The inferior vena cava is normal in size with <50% respiratory  variability, suggesting right atrial pressure of 8 mmHg.   Comparison(s): Compared to prior study in 09/2019, there is no significant change.     Wt Readings from Last 3 Encounters:  09/16/21 213 lb (96.6 kg)  09/15/21 211 lb (95.7 kg)  09/13/21 210 lb 9.6 oz (95.5 kg)      Past Medical History:  Diagnosis Date   Allergy    Anemia    Arthritis    Asthma    Back pain, chronic    GETS INJECTIONS IN BACK   Blind right eye    hemorrhage   Blood transfusion    Cancer ([place])    breast 1994   Clotting disorder ([place])    Diabetes mellitus without complication ([place])    takes precose   Elevated liver enzymes    Gallstones    GERD (gastroesophageal reflux disease)    HX: breast cancer    Hyperlipidemia    Hypertension    Hypothyroid    Lymphedema of arm    RT   Morbid obesity ([facility])    OSA (obstructive sleep apnea)    has not used in 2 years-lost weight   Osteoporosis    Peripheral neuropathy    on gabapentin   PONV (postoperative nausea and vomiting)    Psoriasis    Pulmonary embolism ([place]) 1998 / 1994 /1968   Syncope and collapse 2011   due to anemia    Past Surgical History:  Procedure Laterality Date   ABDOMINAL HYSTERECTOMY     BONE MARROW TRANSPLANT  1994   CARDIOVASCULAR STRESS  TEST  05/23/2005   EF 53%   carpal tunnell     bil   cataracts     CHOLECYSTECTOMY  05/10/2012   Procedure: LAPAROSCOPIC CHOLECYSTECTOMY WITH INTRAOPERATIVE CHOLANGIOGRAM;  Surgeon: Pedro Earls, MD;  Location: [facility];  Service: General;  Laterality: N/A;  Laparoscopic Cholecystectomy with Intraoperative Cholangiogram   GASTRIC BYPASS  2011   bariatric surgery   HIATAL HERNIA REPAIR     IVC filter     recurrent DVT   KNEE ARTHROTOMY  1998   MASS EXCISION Right 11/24/2016   Procedure: EXCISION RIGHT AXILLARY LESION AND CHEST WALL LESION;  Surgeon: Johnathan Hausen, MD;  Location: [place];  Service: General;  Laterality: Right;   MASTECTOMY     Right   US ECHOCARDIOGRAPHY  10/27/08   EF 55-60%    Current Medications:  Current Meds  Medication Sig   acarbose (PRECOSE) 50 MG tablet 1 tablet at start of breakfast and lunch and half tablet at dinnertime   albuterol (PROAIR HFA) 108 (90 Base) MCG/ACT inhaler Inhale 2 puffs into the lungs every 6 (six) hours as needed for wheezing or shortness of breath.   baclofen (LIORESAL) 20 MG tablet TAKE 1 TABLET THREE TIMES A DAY (UPDATED PRESCRIPTION)   Blood Glucose Monitoring Suppl (FREESTYLE FREEDOM LITE) w/Device KIT Use as directed to check blood sugar once daily.   Calcium Carbonate-Vitamin D 600-400 MG-UNIT tablet Take 1 tablet by mouth daily.   clobetasol cream (TEMOVATE) 7.42 % APPLY 1 APPLICATION TOPICALLY TWICE A DAY   diazepam (DIASTAT ACUDIAL) 10 MG GEL Place 5 mg rectally once as needed (cramping).   ferrous sulfate 325 (65 FE) MG tablet Take 325 mg by mouth daily.   fluticasone (FLONASE) 50 MCG/ACT nasal spray Place 2 sprays into both nostrils daily.   fluticasone-salmeterol (ADVAIR DISKUS) 100-50 MCG/ACT AEPB USE 1 INHALATION EVERY 12 HOURS, RINSE MOUTH AFTER USING   FREESTYLE LITE test strip USE AS INSTRUCTED TO CHECK BLOOD SUGAR ONCE DAILY   furosemide (LASIX) 80 MG tablet Take 1 tablet (80 mg total) by mouth 2  (two) times daily.   gabapentin (NEURONTIN) 600 MG tablet TAKE 1 TABLET THREE TIMES A DAY (DOSE CHANGE)   HYDROcodone-acetaminophen (NORCO) 5-325 MG tablet Take 1 tablet by mouth 2 (two) times daily as needed for moderate pain. (Patient taking differently: Take 0.5-1 tablets by mouth 3 (three) times daily as needed for moderate pain or severe pain.)   Lancets (FREESTYLE) lancets USE AS INSTRUCTED TO CHECK BLOOD SUGAR ONCE DAILY   levothyroxine (SYNTHROID) 100 MCG tablet Take 1 tablet (100 mcg total) by mouth daily before breakfast.   loratadine (CLARITIN) 5 MG/5ML syrup Take 5 mLs (5 mg total) by mouth daily.   Magnesium Cl-Calcium Carbonate (SLOW-MAG PO) Take 500 mg by mouth at bedtime.    methylcellulose (ARTIFICIAL TEARS) 1 % ophthalmic solution Place 1 drop into both eyes as needed. Dry eyes   omeprazole (PRILOSEC) 20 MG capsule TAKE 1 CAPSULE DAILY   pravastatin (PRAVACHOL) 10 MG tablet Take 1 tablet (10 mg total) by mouth daily.   spironolactone (ALDACTONE) 100 MG tablet Take 1 tablet (100 mg total) by mouth daily.   temazepam (RESTORIL) 15 MG capsule 1-2 for sleep as needed (Patient taking differently: Take 15-30 mg by mouth at bedtime as needed for sleep. 1-2 for sleep as needed)   vitamin B-12 (CYANOCOBALAMIN) 500 MCG tablet Take 500 mcg by mouth daily.   warfarin (COUMADIN) 5 MG tablet Please take 2 tablets daily except take 1 tablet on Wed or Take as directed by anticoagulation clinic   [DISCONTINUED] furosemide (LASIX) 20 MG tablet Take 2 tablets (40 mg total) by mouth daily. MAY TAKE ADDITIONAL DOSE FOR A TOTAL OF 73m DAILY FOR WEIGHT GAIN OF 3 POUNDS OVERNIGHT OR 5 POUNDS IN ONE WEEK. (Patient taking differently: Take 60 mg by mouth daily. MAY TAKE ADDITIONAL DOSE FOR A TOTAL OF 842mDAILY FOR WEIGHT GAIN OF 3 POUNDS OVERNIGHT OR 5 POUNDS IN ONE WEEK.)   [DISCONTINUED] potassium chloride SA (KLOR-CON M) 20 MEQ tablet Take 2 tablets (40 mEq total) by mouth daily.     Allergies:    Adhesive [tape]   Social History   Socioeconomic History   Marital status: Widowed    Spouse name: Not on file   Number of children: 2   Years  of education: Not on file   Highest education level: Not on file  Occupational History   Occupation: retired  Tobacco Use   Smoking status: Former    Packs/day: 0.50    Years: 10.00    Pack years: 5.00    Types: Cigarettes    Quit date: 07/31/1978    Years since quitting: 43.1   Smokeless tobacco: Never  Vaping Use   Vaping Use: Never used  Substance and Sexual Activity   Alcohol use: No   Drug use: No   Sexual activity: Never  Other Topics Concern   Not on file  Social History Narrative   Widowed with 2 children,  Lives in a one story home and her son and his wife recently moved in with her.  Retired Freight forwarder for [employer].  Education: some college.    Social Determinants of Health   Financial Resource Strain: Low Risk    Difficulty of Paying Living Expenses: Not hard at all  Food Insecurity: No Food Insecurity   Worried About Charity fundraiser in the Last Year: Never true   [place] in the Last Year: Never true  Transportation Needs: No Transportation Needs   Lack of Transportation (Medical): No   Lack of Transportation (Non-Medical): No  Physical Activity: Insufficiently Active   Days of Exercise per Week: 7 days   Minutes of Exercise per Session: 20 min  Stress: No Stress Concern Present   Feeling of Stress : Not at all  Social Connections: Moderately Isolated   Frequency of Communication with Friends and Family: More than three times a week   Frequency of Social Gatherings with Friends and Family: Twice a week   Attends Religious Services: More than 4 times per year   Active Member of [employer] or Organizations: No   Attends Archivist Meetings: Never   Marital Status: Widowed     Family History: The patient's family history includes Asthma in her daughter; Cancer (age of onset: 65) in her sister; Cancer (age  of onset: 27) in her father; Sudden death in her mother.  ROS:   Please see the history of present illness.     All other systems reviewed and are negative.  EKGs/Labs/Other Studies Reviewed:    The following studies were reviewed today:   EKG:  EKG is  ordered today.  The ekg ordered today demonstrates   Sinus rhythm with 1st degree AV block PR 271m  Recent Labs: 09/27/2020: Magnesium 2.0 07/08/2021: Hemoglobin 12.1; Platelets 170.0; Pro B Natriuretic peptide (BNP) 223.0 08/29/2021: ALT 33; BUN 25; Creatinine, Ser 1.13; Potassium 4.4; Sodium 139; TSH 2.23  Recent Lipid Panel    Component Value Date/Time   CHOL 193 08/29/2021 1017   TRIG 52.0 08/29/2021 1017   HDL 92.70 08/29/2021 1017   CHOLHDL 2 08/29/2021 1017   VLDL 10.4 08/29/2021 1017   LDLCALC 90 08/29/2021 1017     Risk Assessment/Calculations:           Physical Exam:    VS:  Vitals:   09/16/21 0913  BP: 138/76  Pulse: 74  SpO2: 99%      Wt Readings from Last 3 Encounters:  09/16/21 213 lb (96.6 kg)  09/15/21 211 lb (95.7 kg)  09/13/21 210 lb 9.6 oz (95.5 kg)     GEN:  Well nourished, well developed in no acute distress HEENT: Normal NECK: No significant JVD; No carotid bruits LYMPHATICS: No lymphadenopathy CARDIAC: RRR, no murmurs, rubs, gallops RESPIRATORY:  Clear to auscultation without rales, wheezing or rhonchi  ABDOMEN: Soft, non-tender, non-distended MUSCULOSKELETAL: RUE edema, BL LE edema thigh to the feet SKIN: Warm and dry NEUROLOGIC:  Alert and oriented x 3 PSYCHIATRIC:  Normal affect   ASSESSMENT:    #Lymphadema: patient initially had some signs c/f Hfpef. Her BNP was mildly elevated but could be underestimated with weight. She's had no admissions for CHF. Her echo showed, normal EF,  normal LA size. normal strain, no pulmonary htn, E/e' < 14. Indicating her LVEDP is not elevated. Steroids can lead to fluid retention, will increase her diuretic today.  - increase to lasix 80 mg  BID - continue potassium supplement 40 meq - lymphedema clinic  #HTN: good control   - Continue spironolactone 100 mg daily  #HLD: LDL 88 mg/dL at goal  PLAN:    In order of problems listed above:  Increase to lasix 80 mg BID Refill potassium Can repeat labs on follow up Follow up 3 months        Medication Adjustments/Labs and Tests Ordered: Current medicines are reviewed at length with the patient today.  Concerns regarding medicines are outlined above.  No orders of the defined types were placed in this encounter.  Meds ordered this encounter  Medications   furosemide (LASIX) 80 MG tablet    Sig: Take 1 tablet (80 mg total) by mouth 2 (two) times daily.    Dispense:  180 tablet    Refill:  3   potassium chloride SA (KLOR-CON M) 20 MEQ tablet    Sig: Take 2 tablets (40 mEq total) by mouth 2 (two) times daily.    Dispense:  360 tablet    Refill:  3    Patient Instructions  Medication Instructions:  Your physician has recommended you make the following change in your medication:  INCREASE: Lasix 80 mg twice daily INCREASE: Potassium 40 mEq twice daily *If you need a refill on your cardiac medications before your next appointment, please call your pharmacy*   Lab Work: None If you have labs (blood work) drawn today and your tests are completely normal, you will receive your results only by: [place] (if you have MyChart) OR A paper copy in the mail If you have any lab test that is abnormal or we need to change your treatment, we will call you to review the results.   Testing/Procedures: None   Follow-Up: At [facility], you and your health needs are our priority.  As part of our continuing mission to provide you with exceptional heart care, we have created designated Provider Care Teams.  These Care Teams include your primary Cardiologist (physician) and Advanced Practice Providers (APPs -  Physician Assistants and Nurse Practitioners) who all work  together to provide you with the care you need, when you need it.  We recommend signing up for the patient portal called "MyChart".  Sign up information is provided on this After Visit Summary.  MyChart is used to connect with patients for Virtual Visits (Telemedicine).  Patients are able to view lab/test results, encounter notes, upcoming appointments, etc.  Non-urgent messages can be sent to your provider as well.   To learn more about what you can do with MyChart, go to [email or web address].    Your next appointment:   3 month(s)  The format for your next appointment:   In Person  Provider:   Janina Mayo, MD      Signed, Janina Mayo, MD  09/16/2021 10:11 AM  [facility]

## 2021-09-21 ENCOUNTER — Other Ambulatory Visit: Payer: Self-pay | Admitting: Surgery

## 2021-09-21 DIAGNOSIS — Z1231 Encounter for screening mammogram for malignant neoplasm of breast: Secondary | ICD-10-CM

## 2021-09-22 ENCOUNTER — Telehealth: Payer: Self-pay | Admitting: Family Medicine

## 2021-09-22 ENCOUNTER — Other Ambulatory Visit: Payer: Self-pay | Admitting: Family Medicine

## 2021-09-22 DIAGNOSIS — I872 Venous insufficiency (chronic) (peripheral): Secondary | ICD-10-CM

## 2021-09-22 DIAGNOSIS — I890 Lymphedema, not elsewhere classified: Secondary | ICD-10-CM

## 2021-09-22 NOTE — Telephone Encounter (Signed)
An new order was placed for [facility] specialty rehab for lymphedema management.  Their office is located on the same street as this office.  Their phone number is [phone].  Pt can try [facility] at [address] in [place] for the leg garments.  Their phone number is [phone].

## 2021-09-22 NOTE — Telephone Encounter (Signed)
Spoke with the patient and informed her of the message below.  Patient also states she has an order for the bra inserts from 1/16, her appt is not until 3/16 and she needed a new order.  I advised the patient generally orders last or are good for some time, if a new order is needed to call back and leave a message for Dr Volanda Napoleon.

## 2021-09-22 NOTE — Telephone Encounter (Signed)
Patient called in stating that the place where she gets her leg garments from no longer do them.  Patient still haven't heard from lymphodema clinic.  Patient is now requesting a referral to another place where they provide leg garments.  Patient would like to be contacted at [phone].  Please advise.

## 2021-09-23 ENCOUNTER — Other Ambulatory Visit: Payer: Self-pay

## 2021-09-23 DIAGNOSIS — K912 Postsurgical malabsorption, not elsewhere classified: Secondary | ICD-10-CM

## 2021-09-23 MED ORDER — FREESTYLE LANCETS MISC: 1 refills | Status: AC

## 2021-09-23 MED ORDER — ACARBOSE 50 MG PO TABS: ORAL_TABLET | ORAL | 2 refills | Status: DC

## 2021-09-23 MED ORDER — ACARBOSE 50 MG PO TABS: ORAL_TABLET | ORAL | 0 refills | Status: DC

## 2021-09-23 NOTE — Telephone Encounter (Signed)
Patient left voicemail to have [facility]. Freestyle lancets sent as well. Also wanted a few sent into her local [facility] to carry her until she gets them from [facility] sent

## 2021-09-27 ENCOUNTER — Ambulatory Visit: Payer: Medicare Other | Attending: Family Medicine

## 2021-09-27 ENCOUNTER — Other Ambulatory Visit: Payer: Self-pay

## 2021-09-27 DIAGNOSIS — Z9011 Acquired absence of right breast and nipple: Secondary | ICD-10-CM | POA: Insufficient documentation

## 2021-09-27 DIAGNOSIS — I890 Lymphedema, not elsewhere classified: Secondary | ICD-10-CM | POA: Diagnosis present

## 2021-09-27 DIAGNOSIS — I972 Postmastectomy lymphedema syndrome: Secondary | ICD-10-CM | POA: Insufficient documentation

## 2021-09-27 DIAGNOSIS — I872 Venous insufficiency (chronic) (peripheral): Secondary | ICD-10-CM | POA: Insufficient documentation

## 2021-09-27 NOTE — Therapy (Signed)
OUTPATIENT PHYSICAL THERAPY ONCOLOGY EVALUATION  Patient Name: Kelli Martinez MRN: [id number] DOB:April 11, 1946, 76 y.o., female Today's Date: 09/27/2021   PT End of Session - 09/27/21 1619     Visit Number 1    Number of Visits 18    Date for PT Re-Evaluation 11/08/21    PT Start Time 0902    PT Stop Time 0955    PT Time Calculation (min) 53 min    Activity Tolerance Patient tolerated treatment well    Behavior During Therapy WFL for tasks assessed/performed             Past Medical History:  Diagnosis Date   Allergy    Anemia    Arthritis    Asthma    Back pain, chronic    GETS INJECTIONS IN BACK   Blind right eye    hemorrhage   Blood transfusion    Cancer ([place])    breast 1994   Clotting disorder ([place])    Diabetes mellitus without complication ([place])    takes precose   Elevated liver enzymes    Gallstones    GERD (gastroesophageal reflux disease)    HX: breast cancer    Hyperlipidemia    Hypertension    Hypothyroid    Lymphedema of arm    RT   Morbid obesity ([place])    OSA (obstructive sleep apnea)    has not used in 2 years-lost weight   Osteoporosis    Peripheral neuropathy    on gabapentin   PONV (postoperative nausea and vomiting)    Psoriasis    Pulmonary embolism ([place]) 1998 / 1994 /1968   Syncope and collapse 2011   due to anemia   Past Surgical History:  Procedure Laterality Date   ABDOMINAL HYSTERECTOMY     BONE MARROW TRANSPLANT  1994   CARDIOVASCULAR STRESS TEST  05/23/2005   EF 53%   carpal tunnell     bil   cataracts     CHOLECYSTECTOMY  05/10/2012   Procedure: LAPAROSCOPIC CHOLECYSTECTOMY WITH INTRAOPERATIVE CHOLANGIOGRAM;  Surgeon: Pedro Earls, MD;  Location: [facility];  Service: General;  Laterality: N/A;  Laparoscopic Cholecystectomy with Intraoperative Cholangiogram   GASTRIC BYPASS  2011   bariatric surgery   HIATAL HERNIA REPAIR     IVC filter     recurrent DVT   KNEE ARTHROTOMY  1998   MASS EXCISION Right  11/24/2016   Procedure: EXCISION RIGHT AXILLARY LESION AND CHEST WALL LESION;  Surgeon: Johnathan Hausen, MD;  Location: [place];  Service: General;  Laterality: Right;   MASTECTOMY     Right   US ECHOCARDIOGRAPHY  10/27/08   EF 55-60%   Patient Active Problem List   Diagnosis Date Noted   Hypoglycemia following gastrointestinal surgery 03/19/2018   Edema of both lower extremities due to peripheral venous insufficiency 11/20/2017   Maxillary sinusitis, acute 08/31/2017   Encounter for therapeutic drug monitoring 08/26/2013   Insomnia 05/01/2013   Long term (current) use of anticoagulants 04/15/2013   Postsurgical dumping syndrome 04/06/2013   Bilateral leg edema 04/06/2013   Reactive hypoglycemia 04/02/2013   Abdominal pain intermittent-evalulating for internal hernia 11/07/2012   S/P laparoscopic cholecystectomy 05/31/2012   Roux Y Gastric Bypass April 2011; repair [facility] 11/22/2011   Unspecified deficiency anemia 11/14/2011   Left leg paresthesias 10/27/2010   Hypothyroid 10/27/2010   Low back pain 10/27/2010   Obstructive sleep apnea 09/29/2007   PULMONARY EMBOLISM 09/25/2007   Seasonal and perennial allergic rhinitis  09/25/2007   Allergic asthma, mild persistent, uncomplicated 47/42/5956   BREAST CANCER, HX OF 09/25/2007    PCP: Billie Ruddy, MD  REFERRING PROVIDER: Billie Ruddy, MD  REFERRING DIAG: Right UE lymphedema  THERAPY DIAG:  Secondary lymphedema  Postmastectomy lymphedema  ONSET DATE: swelling started several years after her surgery.  SUBJECTIVE                                                                                                                                                                                           SUBJECTIVE STATEMENT:Pt  noticed swelling in her right UE several years after surgery. She had prior lymphedema treatment in [place] and at [facility].  Her most recent sleeve was 5 years ago.  She feels that she  is able to wrap her arm well, but has trouble getting the tape on. Her sleeve from 5 years ago does not fit well and is in need of being replaced  PERTINENT HISTORY: Pt had right breast mastectomy in 1994. She had 10/13 LN's that were malignant. She had chemo and radiation as well as a bone marrow transplant in 1994. Went to a lymphedema clinic in [place], and had treatment here as well many years ago. Had a sleeve from 5 years ago but it is too tight. She takes Diurtetics and in the morning her legs are down a lot. Sometimes she feels swelling all over. Heart testing was OK  PAIN:  Are you having pain? Yes NPRS scale: 4/10 Pain location: right UE Pain orientation: Right, Proximal, and Lower arm PAIN TYPE: heavy Pain description: intermittent  Aggravating factors: swelling increases and makes it worse, heat, tight clothing Relieving factors: looser clothing  PRECAUTIONS: Other: IVC filter for DVT and PE, DM, hypertension, gastric bypass , lymphedema  WEIGHT BEARING RESTRICTIONS No, but pt uses a SPC  FALLS:  Has patient fallen in last 6 months? No, Number of falls: 0  LIVING ENVIRONMENT: Lives with: lives alone Lives in: House/apartment Stairs: No; Internal: no steps; ramp Has following equipment at home: Single point cane  OCCUPATION: retired 1999  LEISURE: swimming, aerobics  HAND DOMINANCE : right   PRIOR LEVEL OF FUNCTION: Independent  PATIENT GOALS Decrease swelling in right UE   OBJECTIVE  COGNITION:  Overall cognitive status: Within functional limits for tasks assessed   PALPATION: NA  OBSERVATIONS / OTHER ASSESSMENTS: radiation fibrosis right neck and chest  SENSATION:  Light touch: Appears intact, occasional numbness and cramping bilateral hands greatest    POSTURE: forward head, rounded shoulders  UPPER EXTREMITY AROM/PROM:  A/PROM RIGHT  09/27/2021   Shoulder extension   Shoulder  flexion 110 pain  Shoulder abduction 80 pain  Shoulder internal  rotation   Shoulder external rotation     (Blank rows = not tested)  A/PROM LEFT  09/27/2021  Shoulder extension   Shoulder flexion 137  Shoulder abduction 125  Shoulder internal rotation   Shoulder external rotation     (Blank rows = not tested)   CERVICAL AROM: All within functional limits:      UPPER EXTREMITY STRENGTH: WFL   LYMPHEDEMA ASSESSMENTS:   SURGERY TYPE/DATE: right mastectomy  NUMBER OF LYMPH NODES REMOVED: 13  CHEMOTHERAPY: yes  RADIATION:yes  HORMONE TREATMENT: no  INFECTIONS: no  LYMPHEDEMA ASSESSMENTS:   LANDMARK RIGHT  09/27/2021  10 cm proximal to olecranon process 40.8  Olecranon process 29.5  10 cm proximal to ulnar styloid process 31.9  Just proximal to ulnar styloid process 18.4  Across hand at thumb web space 19.5  At base of 2nd digit 6.0  (Blank rows = not tested)  LANDMARK LEFT  09/27/2021  10 cm proximal to olecranon process 37.2  Olecranon process 26.5  10 cm proximal to ulnar styloid process 20.4  Just proximal to ulnar styloid process 16.2  Across hand at thumb web space 18.6  At base of 2nd digit 5.7  (Blank rows = not tested)    LE LANDMARK RIGHT 09/27/2021  At groin   30 cm proximal to suprapatella   20 cm proximal to suprapatella   10 cm proximal to suprapatella   At midpatella / popliteal crease   30 cm proximal to floor at lateral plantar foot   20 cm proximal to floor at lateral plantar foot   10 cm proximal to floor at lateral plantar foot   Circumference of ankle/heel   5 cm proximal to 1st MTP joint   Across MTP joint   Around proximal great toe   (Blank rows = not tested)  LE LANDMARK LEFT 09/27/2021  At groin   30 cm proximal to suprapatella   20 cm proximal to suprapatella   10 cm proximal to suprapatella   At midpatella / popliteal crease   30 cm proximal to floor at lateral plantar foot   20 cm proximal to floor at lateral plantar foot   10 cm proximal to floor at lateral plantar foot    Circumference of ankle/heel   5 cm proximal to 1st MTP joint   Across MTP joint   Around proximal great toe   (Blank rows = not tested)     QUICK DASH SURVEY: NA   TODAY'S TREATMENT  Discussed treatment for Lymphedema including Manual lymphatic drainage, compression bandaging, education in skin care and exercise. Pt is familiar and agreeable with the process as she has had it before.  PATIENT EDUCATION:  Education details: Discussed length of treatment, time frame and goals Person educated: Patient Education method: Explanation Education comprehension: verbalized understanding   HOME EXERCISE PROGRAM: NA  ASSESSMENT:  CLINICAL IMPRESSION: Patient is a 76 y.o. female who was seen today for physical therapy evaluation and treatment for Right UE lymphedema.  She has had swelling for many years and has been treated for swelling on several occasions both here and in [place].  She will benefit from CDT to reduce edema which will also assist shoulder pain which gets worse when her arm is heavy.   OBJECTIVE IMPAIRMENTS decreased knowledge of condition, decreased ROM, increased edema, postural dysfunction, and pain.   ACTIVITY LIMITATIONS cleaning, reaching  PERSONAL FACTORS 3+ comorbidities: DVT/recurrent PE, DM,  hypertension  are also affecting patient's functional outcome.    REHAB POTENTIAL: Good  CLINICAL DECISION MAKING: Stable/uncomplicated  EVALUATION COMPLEXITY: Low   GOALS: Goals reviewed with patient? Yes  SHORT TERM GOALS:    Target Date Goal status                                      LONG TERM GOALS:   LTG Name Target Date Goal status  1 Pt will be independent with compression bandaging to reduce UE lymphedema Baseline: 11/08/2021 INITIAL  2 Pt will have reduction at 10 cm prox to ulna by 4-6 cm to reduce heaviness of UE Baseline: 11/08/2021 INITIAL  3 Pt will be independent in self right UE MLD to reduce swelling Baseline: 11/08/2021 INITIAL   4 Pt will be fit for compression garments and will be able to don independently Baseline: 11/08/2021 INITIAL  5     6     7       PLAN: PT FREQUENCY: 3x/week  PT DURATION: 6 weeks  PLANNED INTERVENTIONS: Therapeutic exercises, Patient/Family education, Orthotic/Fit training, Manual lymph drainage, Compression bandaging, Manual therapy, and Neuro Muscular re-education  PLAN FOR NEXT SESSION: MLD, bandaging, instruct pt in bandaging and MLD soon   Claris Pong, PT 09/27/2021, 4:43 PM

## 2021-10-03 ENCOUNTER — Other Ambulatory Visit: Payer: Self-pay

## 2021-10-03 ENCOUNTER — Encounter: Payer: Self-pay | Admitting: Rehabilitation

## 2021-10-03 ENCOUNTER — Ambulatory Visit: Payer: Medicare Other | Attending: Family Medicine | Admitting: Rehabilitation

## 2021-10-03 DIAGNOSIS — I890 Lymphedema, not elsewhere classified: Secondary | ICD-10-CM | POA: Insufficient documentation

## 2021-10-03 DIAGNOSIS — I972 Postmastectomy lymphedema syndrome: Secondary | ICD-10-CM | POA: Insufficient documentation

## 2021-10-03 NOTE — Therapy (Signed)
?OUTPATIENT PHYSICAL THERAPY TREATMENT NOTE ? ? ?Patient Name: Kelli Martinez ?MRN: [id number] ?DOB:1946-06-19, 76 y.o., female ?Today's Date: 10/03/2021 ? ?PCP: Billie Ruddy, MD ?REFERRING PROVIDER: Billie Ruddy, MD ? ? PT End of Session - 10/03/21 1056   ? ? Visit Number 2   ? Number of Visits 18   ? Date for PT Re-Evaluation 11/08/21   ? PT Start Time 1100   ? PT Stop Time 1202   ? PT Time Calculation (min) 62 min   ? Activity Tolerance Patient tolerated treatment well   ? Behavior During Therapy [facility] for tasks assessed/performed   ? ?  ?  ? ?  ? ? ?Past Medical History:  ?Diagnosis Date  ? Allergy   ? Anemia   ? Arthritis   ? Asthma   ? Back pain, chronic   ? GETS INJECTIONS IN BACK  ? Blind right eye   ? hemorrhage  ? Blood transfusion   ? Cancer [facility])   ? breast 1994  ? Clotting disorder ([place])   ? Diabetes mellitus without complication ([place])   ? takes precose  ? Elevated liver enzymes   ? Gallstones   ? GERD (gastroesophageal reflux disease)   ? HX: breast cancer   ? Hyperlipidemia   ? Hypertension   ? Hypothyroid   ? Lymphedema of arm   ? RT  ? Morbid obesity ([place])   ? OSA (obstructive sleep apnea)   ? has not used in 2 years-lost weight  ? Osteoporosis   ? Peripheral neuropathy   ? on gabapentin  ? PONV (postoperative nausea and vomiting)   ? Psoriasis   ? Pulmonary embolism ([place]) 1998 / 1994 /1968  ? Syncope and collapse 2011  ? due to anemia  ? ?Past Surgical History:  ?Procedure Laterality Date  ? ABDOMINAL HYSTERECTOMY    ? BONE MARROW TRANSPLANT  1994  ? CARDIOVASCULAR STRESS TEST  05/23/2005  ? EF 53%  ? carpal tunnell    ? bil  ? cataracts    ? CHOLECYSTECTOMY  05/10/2012  ? Procedure: LAPAROSCOPIC CHOLECYSTECTOMY WITH INTRAOPERATIVE CHOLANGIOGRAM;  Surgeon: Pedro Earls, MD;  [facility];  Service: General;  Laterality: N/A;  Laparoscopic Cholecystectomy with Intraoperative Cholangiogram  ? GASTRIC BYPASS  2011  ? bariatric surgery  ? HIATAL HERNIA REPAIR    ? IVC filter    ?  recurrent DVT  ? KNEE ARTHROTOMY  1998  ? MASS EXCISION Right 11/24/2016  ? Procedure: EXCISION RIGHT AXILLARY LESION AND CHEST WALL LESION;  Surgeon: Johnathan Hausen, MD;  Location: [place];  Service: General;  Laterality: Right;  ? MASTECTOMY    ? Right  ? US ECHOCARDIOGRAPHY  10/27/08  ? EF 55-60%  ? ?Patient Active Problem List  ? Diagnosis Date Noted  ? Hypoglycemia following gastrointestinal surgery 03/19/2018  ? Edema of both lower extremities due to peripheral venous insufficiency 11/20/2017  ? Maxillary sinusitis, acute 08/31/2017  ? Encounter for therapeutic drug monitoring 08/26/2013  ? Insomnia 05/01/2013  ? Long term (current) use of anticoagulants 04/15/2013  ? Postsurgical dumping syndrome 04/06/2013  ? Bilateral leg edema 04/06/2013  ? Reactive hypoglycemia 04/02/2013  ? Abdominal pain intermittent-evalulating for internal hernia 11/07/2012  ? S/P laparoscopic cholecystectomy 05/31/2012  ? Roux Y Gastric Bypass April 2011; repair [facility] 11/22/2011  ? Unspecified deficiency anemia 11/14/2011  ? Left leg paresthesias 10/27/2010  ? Hypothyroid 10/27/2010  ? Low back pain 10/27/2010  ? Obstructive sleep apnea  09/29/2007  ? PULMONARY EMBOLISM 09/25/2007  ? Seasonal and perennial allergic rhinitis 09/25/2007  ? Allergic asthma, mild persistent, uncomplicated 24/58/0998  ? BREAST CANCER, HX OF 09/25/2007  ? ? ?REFERRING DIAG: Rt UE lymphedema ? ?THERAPY DIAG:  ?Postmastectomy lymphedema ? ?PERTINENT HISTORY: PERTINENT HISTORY: Pt had right breast mastectomy in 1994. She had 10/13 LN's that were malignant. She had chemo and radiation as well as a bone marrow transplant in 1994. Went to a lymphedema clinic in [place], and had treatment here as well many years ago. Had a sleeve from 5 years ago but it is too tight. She takes Diurtetics and in the morning her legs are down a lot. Sometimes she feels swelling all over. Heart testing was OK ? ?PRECAUTIONS: Rt lympehdema ? ?SUBJECTIVE: Ready to start  bandaging ? ?PAIN:  ?Are you having pain? Yes ?NPRS scale: 6/10 ?Pain location: Rt UE ?Pain orientation: Right  ?PAIN TYPE: aching ?Pain description: constant  ?Aggravating factors: using the arm ?Relieving factors: elevation ? ? ?LYMPHEDEMA ASSESSMENTS:  ?  ?LANDMARK RIGHT  09/27/2021  ?10 cm proximal to olecranon process 40.8  ?Olecranon process 29.5  ?10 cm proximal to ulnar styloid process 31.9  ?Just proximal to ulnar styloid process 18.4  ?Across hand at thumb web space 19.5  ?At base of 2nd digit 6.0  ?(Blank rows = not tested) ?  ?  ?TODAY'S TREATMENT  ?09/27/21  ?Discussed treatment for Lymphedema including Manual lymphatic drainage, compression bandaging, education in skin care and exercise. Pt is familiar and agreeable with the process as she has had it before. ? ?10/03/21 ?Reviewed current knowledge of self care: has limited knowledge of MLD remembering only distal to proximal ?Manual Therapy:  ?In supine: Short neck, 5 diaphragmatic breaths, bil axillary nodes and establishment of interaxillary pathway, R inguinal nodes and establishment of axilloinguinal pathway, then R UE working proximal to distal, moving inner upper arm outwards and upwards, and doing both sides of forearms, spending extra time in any areas of fibrosis then retracing all steps with education on reasons for MLD and principles throughout the session.   ?Bandaging: Re-education on care of bandages and when to remove if uncomfortable.  Then applied tg soft size medium, fingers 1-4, artiflex from hand to axilla with extra padding to round out elbow and upper arm shape, then 1 6cm hand bandage, 1 8cm, and 2 10cm bandages applied from wrist to axilla.  8 cm in partial herringbone pattern at upper lower arm.   ?Pt reports bandages feel comfortable upon leaving and pt was reminded about remedial exercises for increased fluid movement.  ? ? ?  ?PATIENT EDUCATION:  ?Education details: Discussed length of treatment, time frame and goals ?Person  educated: Patient ?Education method: Explanation ?Education comprehension: verbalized understanding ?  ?  ?HOME EXERCISE PROGRAM: ?NA ?  ?ASSESSMENT: ?  ?CLINICAL IMPRESSION: ?1st session of CDT today including MLD and bandaging. Pt has no pitting noted during MLD more adipose noted but pt reports the UE still fluctuates.  Pt may need more foam for shaping the UE after basic bandage was applied today.   ?  ?  ?OBJECTIVE IMPAIRMENTS decreased knowledge of condition, decreased ROM, increased edema, postural dysfunction, and pain.  ?   ?  ?GOALS: ?LONG TERM GOALS:  ?  ?LTG Name Target Date Goal status  ?1 Pt will be independent with compression bandaging to reduce UE lymphedema ?Baseline: 11/08/2021 INITIAL  ?2 Pt will have reduction at 10 cm prox to ulna by 4-6  cm to reduce heaviness of UE ?Baseline: 11/08/2021 INITIAL  ?3 Pt will be independent in self right UE MLD to reduce swelling ?Baseline: 11/08/2021 INITIAL  ?4 Pt will be fit for compression garments and will be able to don independently ?Baseline: 11/08/2021 INITIAL  ?5        ?6        ?7        ?  ?PLAN: ?PT FREQUENCY: 3x/week ?  ?PT DURATION: 6 weeks ?  ?PLANNED INTERVENTIONS: Therapeutic exercises, Patient/Family education, Orthotic/Fit training, Manual lymph drainage, Compression bandaging, Manual therapy, and Neuro Muscular re-education ?  ?PLAN FOR NEXT SESSION: MLD, bandaging (try rosidal vs artiflex?) , instruct pt in bandaging and MLD soon ?  ? ? ? ? ? ?Stark Bray, PT ?10/03/2021, 12:06 PM ? ?  ? ?

## 2021-10-05 ENCOUNTER — Other Ambulatory Visit: Payer: Self-pay

## 2021-10-05 ENCOUNTER — Ambulatory Visit: Payer: Medicare Other

## 2021-10-05 DIAGNOSIS — I890 Lymphedema, not elsewhere classified: Secondary | ICD-10-CM

## 2021-10-05 DIAGNOSIS — I972 Postmastectomy lymphedema syndrome: Secondary | ICD-10-CM

## 2021-10-05 NOTE — Therapy (Signed)
OUTPATIENT PHYSICAL THERAPY TREATMENT NOTE   Patient Name: Kelli Martinez MRN: [id number] DOB:November 01, 1945, 76 y.o., female Today's Date: 10/05/2021  PCP: Billie Ruddy, MD REFERRING PROVIDER: Billie Ruddy, MD   PT End of Session - 10/05/21 1111     Visit Number 3    Number of Visits 18    Date for PT Re-Evaluation 11/08/21    PT Start Time 1109    PT Stop Time 7353    PT Time Calculation (min) 55 min    Activity Tolerance Patient tolerated treatment well    Behavior During Therapy WFL for tasks assessed/performed             Past Medical History:  Diagnosis Date   Allergy    Anemia    Arthritis    Asthma    Back pain, chronic    GETS INJECTIONS IN BACK   Blind right eye    hemorrhage   Blood transfusion    Cancer ([place])    breast 1994   Clotting disorder ([place])    Diabetes mellitus without complication ([place])    takes precose   Elevated liver enzymes    Gallstones    GERD (gastroesophageal reflux disease)    HX: breast cancer    Hyperlipidemia    Hypertension    Hypothyroid    Lymphedema of arm    RT   Morbid obesity ([place])    OSA (obstructive sleep apnea)    has not used in 2 years-lost weight   Osteoporosis    Peripheral neuropathy    on gabapentin   PONV (postoperative nausea and vomiting)    Psoriasis    Pulmonary embolism ([place]) 1998 / 1994 /1968   Syncope and collapse 2011   due to anemia   Past Surgical History:  Procedure Laterality Date   ABDOMINAL HYSTERECTOMY     BONE MARROW TRANSPLANT  1994   CARDIOVASCULAR STRESS TEST  05/23/2005   EF 53%   carpal tunnell     bil   cataracts     CHOLECYSTECTOMY  05/10/2012   Procedure: LAPAROSCOPIC CHOLECYSTECTOMY WITH INTRAOPERATIVE CHOLANGIOGRAM;  Surgeon: Pedro Earls, MD;  Location: [facility];  Service: General;  Laterality: N/A;  Laparoscopic Cholecystectomy with Intraoperative Cholangiogram   GASTRIC BYPASS  2011   bariatric surgery   HIATAL HERNIA REPAIR     IVC filter      recurrent DVT   KNEE ARTHROTOMY  1998   MASS EXCISION Right 11/24/2016   Procedure: EXCISION RIGHT AXILLARY LESION AND CHEST WALL LESION;  Surgeon: Johnathan Hausen, MD;  Location: [facility];  Service: General;  Laterality: Right;   MASTECTOMY     Right   US ECHOCARDIOGRAPHY  10/27/08   EF 55-60%   Patient Active Problem List   Diagnosis Date Noted   Hypoglycemia following gastrointestinal surgery 03/19/2018   Edema of both lower extremities due to peripheral venous insufficiency 11/20/2017   Maxillary sinusitis, acute 08/31/2017   Encounter for therapeutic drug monitoring 08/26/2013   Insomnia 05/01/2013   Long term (current) use of anticoagulants 04/15/2013   Postsurgical dumping syndrome 04/06/2013   Bilateral leg edema 04/06/2013   Reactive hypoglycemia 04/02/2013   Abdominal pain intermittent-evalulating for internal hernia 11/07/2012   S/P laparoscopic cholecystectomy 05/31/2012   Roux Y Gastric Bypass April 2011; repair [facility] 11/22/2011   Unspecified deficiency anemia 11/14/2011   Left leg paresthesias 10/27/2010   Hypothyroid 10/27/2010   Low back pain 10/27/2010   Obstructive sleep apnea  09/29/2007   PULMONARY EMBOLISM 09/25/2007   Seasonal and perennial allergic rhinitis 09/25/2007   Allergic asthma, mild persistent, uncomplicated 73/53/2992   BREAST CANCER, HX OF 09/25/2007    REFERRING DIAG: Rt UE lymphedema  THERAPY DIAG:  Postmastectomy lymphedema  Secondary lymphedema  PERTINENT HISTORY: PERTINENT HISTORY: Pt had right breast mastectomy in 1994. She had 10/13 LN's that were malignant. She had chemo and radiation as well as a bone marrow transplant in 1994. Went to a lymphedema clinic in [place], and had treatment here as well many years ago. Had a sleeve from 5 years ago but it is too tight. She takes Diurtetics and in the morning her legs are down a lot. Sometimes she feels swelling all over. Heart testing was OK  PRECAUTIONS: Rt  lympehdema  SUBJECTIVE: The bandages were comfortable but the fingers started sliding off that day. Then the upper arm started getting looser last night so I took everything off to shower.   PAIN:  Are you having pain? No    LYMPHEDEMA ASSESSMENTS:    LANDMARK RIGHT  09/27/2021  10 cm proximal to olecranon process 40.8  Olecranon process 29.5  10 cm proximal to ulnar styloid process 31.9  Just proximal to ulnar styloid process 18.4  Across hand at thumb web space 19.5  At base of 2nd digit 6.0  (Blank rows = not tested)     TODAY'S TREATMENT  10/05/21: Manual Therapy:  In supine: Short neck, 5 diaphragmatic breaths, Lt axillary nodes and establishment of anterior inter-axillary pathway, R inguinal nodes and establishment of Rt axillo-inguinal pathway, then R UE working proximal to distal, moving inner upper arm outwards and upwards, and doing both sides of forearms, spending extra time in any areas of fibrosis then retracing all steps reviewing with pt throughout   Compression Bandaging: Then applied cocoa butter to Rt UE, tg soft size medium, Elastomull fingers (fixated with paper tape) 1-4 with 1/2" gray compression foam to dorsal hand, Rosidal soft from wrist to axilla, then 1 6cm hand and forearm (herring bone here) bandage, 1 8cm, 1 10cm, and 1 12 short stretch compression bandages applied from wrist to axilla.  Pt reports fingers with paper tape felt tighter than last session but no tingling and in continuing remedial exercises they were of tolerable pressure. Also assured pt they will cont to loosen as she conts to keep moving.    10/03/21: Manual Therapy:  In supine: Short neck, 5 diaphragmatic breaths, bil axillary nodes and establishment of interaxillary pathway, R inguinal nodes and establishment of axilloinguinal pathway, then R UE working proximal to distal, moving inner upper arm outwards and upwards, and doing both sides of forearms, spending extra time in any areas of  fibrosis then retracing all steps with education on reasons for MLD and principles throughout the session.   Bandaging: Re-education on care of bandages and when to remove if uncomfortable.  Then applied tg soft size medium, fingers 1-4, artiflex from hand to axilla with extra padding to round out elbow and upper arm shape, then 1 6cm hand bandage, 1 8cm, and 2 10cm bandages applied from wrist to axilla.  8 cm in partial herringbone pattern at upper lower arm.   Pt reports bandages feel comfortable upon leaving and pt was reminded about remedial exercises for increased fluid movement.   09/27/21:  Discussed treatment for Lymphedema including Manual lymphatic drainage, compression bandaging, education in skin care and exercise. Pt is familiar and agreeable with the process as she has  had it before.    PATIENT EDUCATION:  09/27/21: Education details: Discussed length of treatment, time frame and goals Person educated: Patient Education method: Explanation Education comprehension: verbalized understanding     HOME EXERCISE PROGRAM: NA   ASSESSMENT:   CLINICAL IMPRESSION: Pt returns having removed her compression bandages last night as she reports they had started loosening at her upper arm and sliding off, also her finger wraps had started loosening same day as treatment. Adjusted bandages today to add paper tape to fingers and Rosidal soft foam to entire Rt arm. At hand added 1/2" gray foam to allow for consistent gradient pressure from Rosidal foam. Pt reports her fingers felt tighter than last time wrapped but no tingling and remedial exercises improved comfort. This should cont to improve as she conts moving UE with ADLs as well. MLD was also continued prior to bandaging focusing on forearm and upper arm where more fullness present.      OBJECTIVE IMPAIRMENTS decreased knowledge of condition, decreased ROM, increased edema, postural dysfunction, and pain.       GOALS: LONG TERM GOALS:     LTG Name Target Date Goal status  1 Pt will be independent with compression bandaging to reduce UE lymphedema Baseline: 11/08/2021 INITIAL  2 Pt will have reduction at 10 cm prox to ulna by 4-6 cm to reduce heaviness of UE Baseline: 11/08/2021 INITIAL  3 Pt will be independent in self right UE MLD to reduce swelling Baseline: 11/08/2021 INITIAL  4 Pt will be fit for compression garments and will be able to don independently Baseline: 11/08/2021 INITIAL  '5        6        7          '$ PLAN: PT FREQUENCY: 3x/week   PT DURATION: 6 weeks   PLANNED INTERVENTIONS: Therapeutic exercises, Patient/Family education, Orthotic/Fit training, Manual lymph drainage, Compression bandaging, Manual therapy, and Neuro Muscular re-education   PLAN FOR NEXT SESSION: MLD, bandaging (how was Rosidal arm/foam hand?), instruct pt in bandaging and MLD soon        Otelia Limes, PTA 10/05/2021, 12:12 PM

## 2021-10-10 ENCOUNTER — Other Ambulatory Visit: Payer: Self-pay

## 2021-10-10 ENCOUNTER — Ambulatory Visit: Payer: Medicare Other

## 2021-10-10 DIAGNOSIS — I972 Postmastectomy lymphedema syndrome: Secondary | ICD-10-CM | POA: Diagnosis not present

## 2021-10-10 DIAGNOSIS — I890 Lymphedema, not elsewhere classified: Secondary | ICD-10-CM

## 2021-10-10 NOTE — Therapy (Signed)
OUTPATIENT PHYSICAL THERAPY TREATMENT NOTE   Patient Name: Kelli Martinez MRN: [id number] DOB:08-25-1945, 76 y.o., female Today's Date: 10/10/2021  PCP: Billie Ruddy, MD REFERRING PROVIDER: Billie Ruddy, MD   PT End of Session - 10/10/21 [phone]     Visit Number 4    Number of Visits 18    Date for PT Re-Evaluation 11/08/21    PT Start Time 0904    PT Stop Time 0958    PT Time Calculation (min) 54 min    Activity Tolerance Patient tolerated treatment well    Behavior During Therapy [facility] for tasks assessed/performed             Past Medical History:  Diagnosis Date   Allergy    Anemia    Arthritis    Asthma    Back pain, chronic    GETS INJECTIONS IN BACK   Blind right eye    hemorrhage   Blood transfusion    Cancer ([place])    breast 1994   Clotting disorder ([place])    Diabetes mellitus without complication ([place])    takes precose   Elevated liver enzymes    Gallstones    GERD (gastroesophageal reflux disease)    HX: breast cancer    Hyperlipidemia    Hypertension    Hypothyroid    Lymphedema of arm    RT   Morbid obesity ([place])    OSA (obstructive sleep apnea)    has not used in 2 years-lost weight   Osteoporosis    Peripheral neuropathy    on gabapentin   PONV (postoperative nausea and vomiting)    Psoriasis    Pulmonary embolism ([place]) 1998 / 1994 /1968   Syncope and collapse 2011   due to anemia   Past Surgical History:  Procedure Laterality Date   ABDOMINAL HYSTERECTOMY     BONE MARROW TRANSPLANT  1994   CARDIOVASCULAR STRESS TEST  05/23/2005   EF 53%   carpal tunnell     bil   cataracts     CHOLECYSTECTOMY  05/10/2012   Procedure: LAPAROSCOPIC CHOLECYSTECTOMY WITH INTRAOPERATIVE CHOLANGIOGRAM;  Surgeon: Pedro Earls, MD;  Location: [facility];  Service: General;  Laterality: N/A;  Laparoscopic Cholecystectomy with Intraoperative Cholangiogram   GASTRIC BYPASS  2011   bariatric surgery   HIATAL HERNIA REPAIR     IVC filter      recurrent DVT   KNEE ARTHROTOMY  1998   MASS EXCISION Right 11/24/2016   Procedure: EXCISION RIGHT AXILLARY LESION AND CHEST WALL LESION;  Surgeon: Johnathan Hausen, MD;  Location: [facility];  Service: General;  Laterality: Right;   MASTECTOMY     Right   US ECHOCARDIOGRAPHY  10/27/08   EF 55-60%   Patient Active Problem List   Diagnosis Date Noted   Hypoglycemia following gastrointestinal surgery 03/19/2018   Edema of both lower extremities due to peripheral venous insufficiency 11/20/2017   Maxillary sinusitis, acute 08/31/2017   Encounter for therapeutic drug monitoring 08/26/2013   Insomnia 05/01/2013   Long term (current) use of anticoagulants 04/15/2013   Postsurgical dumping syndrome 04/06/2013   Bilateral leg edema 04/06/2013   Reactive hypoglycemia 04/02/2013   Abdominal pain intermittent-evalulating for internal hernia 11/07/2012   S/P laparoscopic cholecystectomy 05/31/2012   Roux Y Gastric Bypass April 2011; repair [facility] 11/22/2011   Unspecified deficiency anemia 11/14/2011   Left leg paresthesias 10/27/2010   Hypothyroid 10/27/2010   Low back pain 10/27/2010   Obstructive sleep apnea  09/29/2007   PULMONARY EMBOLISM 09/25/2007   Seasonal and perennial allergic rhinitis 09/25/2007   Allergic asthma, mild persistent, uncomplicated 16/05/9603   BREAST CANCER, HX OF 09/25/2007    REFERRING DIAG: Rt UE lymphedema  THERAPY DIAG:  Postmastectomy lymphedema  Secondary lymphedema  PERTINENT HISTORY: PERTINENT HISTORY: Pt had right breast mastectomy in 1994. She had 10/13 LN's that were malignant. She had chemo and radiation as well as a bone marrow transplant in 1994. Went to a lymphedema clinic in [place], and had treatment here as well many years ago. Had a sleeve from 5 years ago but it is too tight. She takes Diurtetics and in the morning her legs are down a lot. Sometimes she feels swelling all over. Heart testing was OK  PRECAUTIONS: Rt  lympehdema  SUBJECTIVE: I took the wrap off on Saturday afternoon but I was able to wear the sleeve that didn't fit me before  all weekend during the day but not at night. I forgot to bring my bandages with me. The thiumb wrap came off shortly after I left last time so I cut it off.  PAIN:  Are you having pain? No    LYMPHEDEMA ASSESSMENTS:    LANDMARK RIGHT  09/27/2021  10 cm proximal to olecranon process 40.8  Olecranon process 29.5  10 cm proximal to ulnar styloid process 31.9  Just proximal to ulnar styloid process 18.4  Across hand at thumb web space 19.5  At base of 2nd digit 6.0  (Blank rows = not tested)     TODAY'S TREATMENT  10/10/2021 In supine: Short neck, 5 diaphragmatic breaths, Lt axillary nodes and establishment of anterior inter-axillary pathway, R inguinal nodes and establishment of Rt axillo-inguinal pathway, then R UE working proximal to distal, moving inner upper arm outwards and upwards, and doing both sides of forearms, spending extra time in any areas of fibrosis then retracing all steps reviewing with pt throughout   Compression Bandaging:with new bandages as pt left hers at home; Rt UE, tg soft size medium, Elastomull fingers (fixated with paper tape) 1-4 , Rosidal soft from wrist to axilla, then 1 6cm hand and forearm , 8 cm (herring bone here) bandage right forearm,, 1 10cm, elbow to axilla, and 1 12 short stretch compression bandages applied from wrist to axilla.  Pt asked to leave on several days if comfortable since she does not have any more appts; cancelled 2nd appt this week. And to wear sleeve if she has to take wrap off.     10/05/21: Manual Therapy:  In supine: Short neck, 5 diaphragmatic breaths, Lt axillary nodes and establishment of anterior inter-axillary pathway, R inguinal nodes and establishment of Rt axillo-inguinal pathway, then R UE working proximal to distal, moving inner upper arm outwards and upwards, and doing both sides of forearms,  spending extra time in any areas of fibrosis then retracing all steps reviewing with pt throughout   Compression Bandaging: Then applied cocoa butter to Rt UE, tg soft size medium, Elastomull fingers (fixated with paper tape) 1-4 with 1/2" gray compression foam to dorsal hand, Rosidal soft from wrist to axilla, then 1 6cm hand and forearm (herring bone here) bandage, 1 8cm, 1 10cm, and 1 12 short stretch compression bandages applied from wrist to axilla.  Pt reports fingers with paper tape felt tighter than last session but no tingling and in continuing remedial exercises they were of tolerable pressure. Also assured pt they will cont to loosen as she conts to keep moving.  10/03/21: Manual Therapy:  In supine: Short neck, 5 diaphragmatic breaths, bil axillary nodes and establishment of interaxillary pathway, R inguinal nodes and establishment of axilloinguinal pathway, then R UE working proximal to distal, moving inner upper arm outwards and upwards, and doing both sides of forearms, spending extra time in any areas of fibrosis then retracing all steps with education on reasons for MLD and principles throughout the session.   Bandaging: Re-education on care of bandages and when to remove if uncomfortable.  Then applied tg soft size medium, fingers 1-4, artiflex from hand to axilla with extra padding to round out elbow and upper arm shape, then 1 6cm hand bandage, 1 8cm, and 2 10cm bandages applied from wrist to axilla.  8 cm in partial herringbone pattern at upper lower arm.   Pt reports bandages feel comfortable upon leaving and pt was reminded about remedial exercises for increased fluid movement.     PATIENT EDUCATION:  09/27/21: Education details: Discussed length of treatment, time frame and goals Person educated: Patient Education method: Explanation Education comprehension: verbalized understanding     HOME EXERCISE PROGRAM: NA   ASSESSMENT:   CLINICAL IMPRESSION: Pt removed her  bandages on Saturday and tried on her new sleeve which previously did not fit.  She was able to get it on and wear it comfortably over the weekend during the day.  She came in today without the sleeve and she forgot her wraps so a new set was supplied. She cancelled her 2nd appt this week due to a conflict but is trying to get in off the wait list. Continued MLD as done previously and pt had to urinate soon afterwards.  Modified compression bandaging slightly.  OBJECTIVE IMPAIRMENTS decreased knowledge of condition, decreased ROM, increased edema, postural dysfunction, and pain.       GOALS: LONG TERM GOALS:    LTG Name Target Date Goal status  1 Pt will be independent with compression bandaging to reduce UE lymphedema Baseline: 11/08/2021 INITIAL  2 Pt will have reduction at 10 cm prox to ulna by 4-6 cm to reduce heaviness of UE Baseline: 11/08/2021 INITIAL  3 Pt will be independent in self right UE MLD to reduce swelling Baseline: 11/08/2021 INITIAL  4 Pt will be fit for compression garments and will be able to don independently Baseline: 11/08/2021 INITIAL  '5        6        7          '$ PLAN: PT FREQUENCY: 3x/week   PT DURATION: 6 weeks   PLANNED INTERVENTIONS: Therapeutic exercises, Patient/Family education, Orthotic/Fit training, Manual lymph drainage, Compression bandaging, Manual therapy, and Neuro Muscular re-education   PLAN FOR NEXT SESSION: MLD, bandaging (how was Rosidal arm/foam hand?), instruct pt in bandaging and MLD soon        Claris Pong, PT 10/10/2021, 11:34 AM

## 2021-10-11 ENCOUNTER — Ambulatory Visit (INDEPENDENT_AMBULATORY_CARE_PROVIDER_SITE_OTHER): Payer: Medicare Other

## 2021-10-11 VITALS — Ht 67.5 in | Wt 206.4 lb

## 2021-10-11 DIAGNOSIS — M5416 Radiculopathy, lumbar region: Secondary | ICD-10-CM | POA: Diagnosis not present

## 2021-10-11 DIAGNOSIS — Z1211 Encounter for screening for malignant neoplasm of colon: Secondary | ICD-10-CM | POA: Diagnosis not present

## 2021-10-11 DIAGNOSIS — Z79891 Long term (current) use of opiate analgesic: Secondary | ICD-10-CM | POA: Diagnosis not present

## 2021-10-11 DIAGNOSIS — M4316 Spondylolisthesis, lumbar region: Secondary | ICD-10-CM | POA: Diagnosis not present

## 2021-10-11 DIAGNOSIS — M5136 Other intervertebral disc degeneration, lumbar region: Secondary | ICD-10-CM | POA: Diagnosis not present

## 2021-10-11 DIAGNOSIS — M48061 Spinal stenosis, lumbar region without neurogenic claudication: Secondary | ICD-10-CM | POA: Diagnosis not present

## 2021-10-11 DIAGNOSIS — Z0000 Encounter for general adult medical examination without abnormal findings: Secondary | ICD-10-CM | POA: Diagnosis not present

## 2021-10-11 DIAGNOSIS — M5030 Other cervical disc degeneration, unspecified cervical region: Secondary | ICD-10-CM | POA: Diagnosis not present

## 2021-10-11 NOTE — Patient Instructions (Signed)
Ms. Mallen , ?Thank you for taking time to come for your Medicare Wellness Visit. I appreciate your ongoing commitment to your health goals. Please review the following plan we discussed and let me know if I can assist you in the future.  ? ?Screening recommendations/referrals: ?Colonoscopy: cologuard ordered today ?Mammogram: scheduled for 10/13/2021 ?Bone Density: completed 12/24/2019 ?Recommended yearly ophthalmology/optometry visit for glaucoma screening and checkup ?Recommended yearly dental visit for hygiene and checkup ? ?Vaccinations: ?Influenza vaccine: completed 04/21/2021, due next flu season ?Pneumococcal vaccine: completed 06/19/2019 ?Tdap vaccine: due ?Shingles vaccine: completed   ?Covid-19: 06/25/2020, 10/07/2019, 09/14/2019 ? ?Advanced directives: Advance directive discussed with you today. . ? ? ?Conditions/risks identified: none ? ?Next appointment: Follow up in one year for your annual wellness visit  ? ? ?Preventive Care 69 Years and Older, Female ?Preventive care refers to lifestyle choices and visits with your health care provider that can promote health and wellness. ?What does preventive care include? ?A yearly physical exam. This is also called an annual well check. ?Dental exams once or twice a year. ?Routine eye exams. Ask your health care provider how often you should have your eyes checked. ?Personal lifestyle choices, including: ?Daily care of your teeth and gums. ?Regular physical activity. ?Eating a healthy diet. ?Avoiding tobacco and drug use. ?Limiting alcohol use. ?Practicing safe sex. ?Taking low-dose aspirin every day. ?Taking vitamin and mineral supplements as recommended by your health care provider. ?What happens during an annual well check? ?The services and screenings done by your health care provider during your annual well check will depend on your age, overall health, lifestyle risk factors, and family history of disease. ?Counseling  ?Your health care provider may ask you  questions about your: ?Alcohol use. ?Tobacco use. ?Drug use. ?Emotional well-being. ?Home and relationship well-being. ?Sexual activity. ?Eating habits. ?History of falls. ?Memory and ability to understand (cognition). ?Work and work Statistician. ?Reproductive health. ?Screening  ?You may have the following tests or measurements: ?Height, weight, and BMI. ?Blood pressure. ?Lipid and cholesterol levels. These may be checked every 5 years, or more frequently if you are over 23 years old. ?Skin check. ?Lung cancer screening. You may have this screening every year starting at age 25 if you have a 30-pack-year history of smoking and currently smoke or have quit within the past 15 years. ?Fecal occult blood test (FOBT) of the stool. You may have this test every year starting at age 8. ?Flexible sigmoidoscopy or colonoscopy. You may have a sigmoidoscopy every 5 years or a colonoscopy every 10 years starting at age 34. ?Hepatitis C blood test. ?Hepatitis B blood test. ?Sexually transmitted disease (STD) testing. ?Diabetes screening. This is done by checking your blood sugar (glucose) after you have not eaten for a while (fasting). You may have this done every 1-3 years. ?Bone density scan. This is done to screen for osteoporosis. You may have this done starting at age 33. ?Mammogram. This may be done every 1-2 years. Talk to your health care provider about how often you should have regular mammograms. ?Talk with your health care provider about your test results, treatment options, and if necessary, the need for more tests. ?Vaccines  ?Your health care provider may recommend certain vaccines, such as: ?Influenza vaccine. This is recommended every year. ?Tetanus, diphtheria, and acellular pertussis (Tdap, Td) vaccine. You may need a Td booster every 10 years. ?Zoster vaccine. You may need this after age 5. ?Pneumococcal 13-valent conjugate (PCV13) vaccine. One dose is recommended after age 26. ?Pneumococcal polysaccharide  (  PPSV23) vaccine. One dose is recommended after age 88. ?Talk to your health care provider about which screenings and vaccines you need and how often you need them. ?This information is not intended to replace advice given to you by your health care provider. Make sure you discuss any questions you have with your health care provider. ?Document Released: 08/13/2015 Document Revised: 04/05/2016 Document Reviewed: 05/18/2015 ?Elsevier Interactive Patient Education ? 2017 [place]. ? ?Fall Prevention in the Home ?Falls can cause injuries. They can happen to people of all ages. There are many things you can do to make your home safe and to help prevent falls. ?What can I do on the outside of my home? ?Regularly fix the edges of walkways and driveways and fix any cracks. ?Remove anything that might make you trip as you walk through a door, such as a raised step or threshold. ?Trim any bushes or trees on the path to your home. ?Use bright outdoor lighting. ?Clear any walking paths of anything that might make someone trip, such as rocks or tools. ?Regularly check to see if handrails are loose or broken. Make sure that both sides of any steps have handrails. ?Any raised decks and porches should have guardrails on the edges. ?Have any leaves, snow, or ice cleared regularly. ?Use sand or salt on walking paths during winter. ?Clean up any spills in your garage right away. This includes oil or grease spills. ?What can I do in the bathroom? ?Use night lights. ?Install grab bars by the toilet and in the tub and shower. Do not use towel bars as grab bars. ?Use non-skid mats or decals in the tub or shower. ?If you need to sit down in the shower, use a plastic, non-slip stool. ?Keep the floor dry. Clean up any water that spills on the floor as soon as it happens. ?Remove soap buildup in the tub or shower regularly. ?Attach bath mats securely with double-sided non-slip rug tape. ?Do not have throw rugs and other things on the  floor that can make you trip. ?What can I do in the bedroom? ?Use night lights. ?Make sure that you have a light by your bed that is easy to reach. ?Do not use any sheets or blankets that are too big for your bed. They should not hang down onto the floor. ?Have a firm chair that has side arms. You can use this for support while you get dressed. ?Do not have throw rugs and other things on the floor that can make you trip. ?What can I do in the kitchen? ?Clean up any spills right away. ?Avoid walking on wet floors. ?Keep items that you use a lot in easy-to-reach places. ?If you need to reach something above you, use a strong step stool that has a grab bar. ?Keep electrical cords out of the way. ?Do not use floor polish or wax that makes floors slippery. If you must use wax, use non-skid floor wax. ?Do not have throw rugs and other things on the floor that can make you trip. ?What can I do with my stairs? ?Do not leave any items on the stairs. ?Make sure that there are handrails on both sides of the stairs and use them. Fix handrails that are broken or loose. Make sure that handrails are as long as the stairways. ?Check any carpeting to make sure that it is firmly attached to the stairs. Fix any carpet that is loose or worn. ?Avoid having throw rugs at the top or bottom  of the stairs. If you do have throw rugs, attach them to the floor with carpet tape. ?Make sure that you have a light switch at the top of the stairs and the bottom of the stairs. If you do not have them, ask someone to add them for you. ?What else can I do to help prevent falls? ?Wear shoes that: ?Do not have high heels. ?Have rubber bottoms. ?Are comfortable and fit you well. ?Are closed at the toe. Do not wear sandals. ?If you use a stepladder: ?Make sure that it is fully opened. Do not climb a closed stepladder. ?Make sure that both sides of the stepladder are locked into place. ?Ask someone to hold it for you, if possible. ?Clearly mark and make  sure that you can see: ?Any grab bars or handrails. ?First and last steps. ?Where the edge of each step is. ?Use tools that help you move around (mobility aids) if they are needed. These include: ?Canes. ?Lucillie Garfinkel

## 2021-10-11 NOTE — Progress Notes (Signed)
? ?Subjective:  ? Kelli Martinez is a 76 y.o. female who presents for Medicare Annual (Subsequent) preventive examination. ? ?Review of Systems    ? ?Cardiac Risk Factors include: advanced age (>72mn, >>28women);diabetes mellitus;dyslipidemia;hypertension;obesity (BMI >30kg/m2) ? ?   ?Objective:  ?  ?Today's Vitals  ? 10/11/21 0826  ?Weight: 206 lb 6.4 oz (93.6 kg)  ?Height: 5' 7.5" (1.715 m)  ? ?Body mass index is 31.85 kg/m?. ? ?Advanced Directives 10/11/2021 09/27/2021 10/05/2020 12/25/2018 05/18/2018 11/24/2016 11/20/2016  ?Does Patient Have a Medical Advance Directive? No No No No No No No  ?Would patient like information on creating a medical advance directive? - Yes (MAU/Ambulatory/Procedural Areas - Information given) Yes (MAU/Ambulatory/Procedural Areas - Information given) - - No - Patient declined -  ?Pre-existing out of facility DNR order (yellow form or pink MOST form) - - - - - - -  ? ? ?Current Medications (verified) ?Outpatient Encounter Medications as of 10/11/2021  ?Medication Sig  ? acarbose (PRECOSE) 50 MG tablet 1 tablet at start of breakfast and lunch and half tablet at dinnertime  ? albuterol (PROAIR HFA) 108 (90 Base) MCG/ACT inhaler Inhale 2 puffs into the lungs every 6 (six) hours as needed for wheezing or shortness of breath.  ? baclofen (LIORESAL) 20 MG tablet TAKE 1 TABLET THREE TIMES A DAY (UPDATED PRESCRIPTION)  ? Blood Glucose Monitoring Suppl (FREESTYLE FREEDOM LITE) w/Device KIT Use as directed to check blood sugar once daily.  ? Calcium Carbonate-Vitamin D 600-400 MG-UNIT tablet Take 1 tablet by mouth daily.  ? clobetasol cream (TEMOVATE) 08.25% APPLY 1 APPLICATION TOPICALLY TWICE A DAY  ? diazepam (DIASTAT ACUDIAL) 10 MG GEL Place 5 mg rectally once as needed (cramping).  ? ferrous sulfate 325 (65 FE) MG tablet Take 325 mg by mouth daily.  ? fluticasone (FLONASE) 50 MCG/ACT nasal spray Place 2 sprays into both nostrils daily.  ? fluticasone-salmeterol (ADVAIR DISKUS) 100-50  MCG/ACT AEPB USE 1 INHALATION EVERY 12 HOURS, RINSE MOUTH AFTER USING  ? FREESTYLE LITE test strip USE AS INSTRUCTED TO CHECK BLOOD SUGAR ONCE DAILY  ? furosemide (LASIX) 80 MG tablet Take 1 tablet (80 mg total) by mouth 2 (two) times daily.  ? gabapentin (NEURONTIN) 600 MG tablet TAKE 1 TABLET THREE TIMES A DAY (DOSE CHANGE)  ? HYDROcodone-acetaminophen (NORCO) 5-325 MG tablet Take 1 tablet by mouth 2 (two) times daily as needed for moderate pain. (Patient taking differently: Take 0.5-1 tablets by mouth 3 (three) times daily as needed for moderate pain or severe pain.)  ? Lancets (FREESTYLE) lancets USE AS INSTRUCTED TO CHECK BLOOD SUGAR ONCE DAILY  ? levothyroxine (SYNTHROID) 100 MCG tablet Take 1 tablet (100 mcg total) by mouth daily before breakfast.  ? Magnesium Cl-Calcium Carbonate (SLOW-MAG PO) Take 500 mg by mouth at bedtime.   ? methylcellulose (ARTIFICIAL TEARS) 1 % ophthalmic solution Place 1 drop into both eyes as needed. Dry eyes  ? omeprazole (PRILOSEC) 20 MG capsule TAKE 1 CAPSULE DAILY  ? potassium chloride SA (KLOR-CON M) 20 MEQ tablet Take 2 tablets (40 mEq total) by mouth 2 (two) times daily.  ? pravastatin (PRAVACHOL) 10 MG tablet Take 1 tablet (10 mg total) by mouth daily.  ? spironolactone (ALDACTONE) 100 MG tablet Take 1 tablet (100 mg total) by mouth daily.  ? temazepam (RESTORIL) 15 MG capsule 1-2 for sleep as needed  ? vitamin B-12 (CYANOCOBALAMIN) 500 MCG tablet Take 500 mcg by mouth daily.  ? warfarin (COUMADIN) 5 MG tablet Please  take 2 tablets daily except take 1 tablet on Wed or Take as directed by anticoagulation clinic  ? loratadine (CLARITIN) 5 MG/5ML syrup Take 5 mLs (5 mg total) by mouth daily. (Patient not taking: Reported on 10/11/2021)  ? ?No facility-administered encounter medications on file as of 10/11/2021.  ? ? ?Allergies (verified) ?Adhesive [tape]  ? ?History: ?Past Medical History:  ?Diagnosis Date  ? Allergy   ? Anemia   ? Arthritis   ? Asthma   ? Back pain, chronic   ?  GETS INJECTIONS IN BACK  ? Blind right eye   ? hemorrhage  ? Blood transfusion   ? Cancer [facility])   ? breast 1994  ? Clotting disorder ([place])   ? Diabetes mellitus without complication ([place])   ? takes precose  ? Elevated liver enzymes   ? Gallstones   ? GERD (gastroesophageal reflux disease)   ? HX: breast cancer   ? Hyperlipidemia   ? Hypertension   ? Hypothyroid   ? Lymphedema of arm   ? RT  ? Morbid obesity ([place])   ? OSA (obstructive sleep apnea)   ? has not used in 2 years-lost weight  ? Osteoporosis   ? Peripheral neuropathy   ? on gabapentin  ? PONV (postoperative nausea and vomiting)   ? Psoriasis   ? Pulmonary embolism ([place]) 1998 / 1994 /1968  ? Syncope and collapse 2011  ? due to anemia  ? ?Past Surgical History:  ?Procedure Laterality Date  ? ABDOMINAL HYSTERECTOMY    ? BONE MARROW TRANSPLANT  1994  ? CARDIOVASCULAR STRESS TEST  05/23/2005  ? EF 53%  ? carpal tunnell    ? bil  ? cataracts    ? CHOLECYSTECTOMY  05/10/2012  ? Procedure: LAPAROSCOPIC CHOLECYSTECTOMY WITH INTRAOPERATIVE CHOLANGIOGRAM;  Surgeon: Pedro Earls, MD;  Location: [facility];  Service: General;  Laterality: N/A;  Laparoscopic Cholecystectomy with Intraoperative Cholangiogram  ? GASTRIC BYPASS  2011  ? bariatric surgery  ? HIATAL HERNIA REPAIR    ? IVC filter    ? recurrent DVT  ? KNEE ARTHROTOMY  1998  ? MASS EXCISION Right 11/24/2016  ? Procedure: EXCISION RIGHT AXILLARY LESION AND CHEST WALL LESION;  Surgeon: Johnathan Hausen, MD;  Location: [place];  Service: General;  Laterality: Right;  ? MASTECTOMY    ? Right  ? US ECHOCARDIOGRAPHY  10/27/08  ? EF 55-60%  ? ?Family History  ?Problem Relation Age of Onset  ? Sudden death Mother   ?     car accident  ? Cancer Father 34  ?     lung  ? Cancer Sister 40  ?     lung cancer  ? Asthma Daughter   ? ?Social History  ? ?Socioeconomic History  ? Marital status: Widowed  ?  Spouse name: Not on file  ? Number of children: 2  ? Years of education: Not on file  ? Highest education  level: Not on file  ?Occupational History  ? Occupation: retired  ?Tobacco Use  ? Smoking status: Former  ?  Packs/day: 0.50  ?  Years: 10.00  ?  Pack years: 5.00  ?  Types: Cigarettes  ?  Quit date: 07/31/1978  ?  Years since quitting: 43.2  ? Smokeless tobacco: Never  ?Vaping Use  ? Vaping Use: Never used  ?Substance and Sexual Activity  ? Alcohol use: No  ? Drug use: No  ? Sexual activity: Not Currently  ?Other Topics Concern  ?  Not on file  ?Social History Narrative  ? Widowed with 2 children,  Lives in a one story home and her son and his wife recently moved in with her.  Retired Freight forwarder for [employer].  Education: some college.   ? ?Social Determinants of Health  ? ?Financial Resource Strain: Low Risk   ? Difficulty of Paying Living Expenses: Not hard at all  ?Food Insecurity: No Food Insecurity  ? Worried About Charity fundraiser in the Last Year: Never true  ? Ran Out of Food in the Last Year: Never true  ?Transportation Needs: No Transportation Needs  ? Lack of Transportation (Medical): No  ? Lack of Transportation (Non-Medical): No  ?Physical Activity: Inactive  ? Days of Exercise per Week: 0 days  ? Minutes of Exercise per Session: 0 min  ?Stress: No Stress Concern Present  ? Feeling of Stress : Not at all  ?Social Connections: Not on file  ? ? ?Tobacco Counseling ?Counseling given: Not Answered ? ? ?Clinical Intake: ? ?Pre-visit preparation completed: Yes ? ?Pain : No/denies pain ? ?  ? ?Nutritional Status: BMI > 30  Obese ?Nutritional Risks: None ?Diabetes: Yes ? ?How often do you need to have someone help you when you read instructions, pamphlets, or other written materials from your doctor or pharmacy?: 1 - Never ?What is the last grade level you completed in school?: 26yr college ? ?Diabetic? Yes ?Nutrition Risk Assessment: ? ?Has the patient had any N/V/D within the last 2 months?  No  ?Does the patient have any non-healing wounds?  No  ?Has the patient had any unintentional weight loss or weight gain?  Yes   ? ?Diabetes: ? ?Is the patient diabetic?  Yes  ?If diabetic, was a CBG obtained today?  No  ?Did the patient bring in their glucometer from home?  No  ?How often do you monitor your CBG's? Three times

## 2021-10-13 ENCOUNTER — Ambulatory Visit: Payer: Medicare Other

## 2021-10-13 ENCOUNTER — Ambulatory Visit
Admission: RE | Admit: 2021-10-13 | Discharge: 2021-10-13 | Disposition: A | Discharge status: Home / Self Care | Payer: Medicare Other | Source: Ambulatory Visit | Attending: Surgery | Admitting: Surgery

## 2021-10-13 DIAGNOSIS — Z1231 Encounter for screening mammogram for malignant neoplasm of breast: Secondary | ICD-10-CM | POA: Diagnosis not present

## 2021-10-14 ENCOUNTER — Other Ambulatory Visit: Payer: Self-pay

## 2021-10-14 ENCOUNTER — Ambulatory Visit (INDEPENDENT_AMBULATORY_CARE_PROVIDER_SITE_OTHER): Payer: Medicare Other

## 2021-10-14 DIAGNOSIS — Z7901 Long term (current) use of anticoagulants: Secondary | ICD-10-CM | POA: Diagnosis not present

## 2021-10-14 LAB — POCT INR: INR: 2.5 (ref 2.0–3.0)

## 2021-10-14 NOTE — Progress Notes (Signed)
Continue to take 2 tablets daily except 1 tablet on Wed and Saturdays.  Re-check in 4 weeks.  

## 2021-10-14 NOTE — Patient Instructions (Addendum)
Pre visit review using our clinic review tool, if applicable. No additional management support is needed unless otherwise documented below in the visit note.  Continue to take 2 tablets daily except 1 tablet on Wed and Saturdays.  Re-check in 4 weeks.  

## 2021-10-17 ENCOUNTER — Ambulatory Visit: Payer: Medicare Other

## 2021-10-17 ENCOUNTER — Other Ambulatory Visit: Payer: Self-pay | Admitting: Family Medicine

## 2021-10-17 ENCOUNTER — Other Ambulatory Visit: Payer: Self-pay

## 2021-10-17 DIAGNOSIS — I972 Postmastectomy lymphedema syndrome: Secondary | ICD-10-CM | POA: Diagnosis not present

## 2021-10-17 DIAGNOSIS — I890 Lymphedema, not elsewhere classified: Secondary | ICD-10-CM

## 2021-10-17 DIAGNOSIS — Z7901 Long term (current) use of anticoagulants: Secondary | ICD-10-CM

## 2021-10-17 NOTE — Therapy (Signed)
?OUTPATIENT PHYSICAL THERAPY TREATMENT NOTE ? ? ?Patient Name: Kelli Martinez ?MRN: [id number] ?DOB:Sep 07, 1945, 76 y.o., female ?Today's Date: 10/17/2021 ? ?PCP: Billie Ruddy, MD ?REFERRING PROVIDER: Billie Ruddy, MD ? ? PT End of Session - 10/17/21 0902   ? ? Visit Number 5   ? Number of Visits 18   ? Date for PT Re-Evaluation 11/08/21   ? PT Start Time [phone]   ? PT Stop Time 1000   ? PT Time Calculation (min) 57 min   ? Activity Tolerance Patient tolerated treatment well   ? Behavior During Therapy [facility] for tasks assessed/performed   ? ?  ?  ? ?  ? ? ?Past Medical History:  ?Diagnosis Date  ? Allergy   ? Anemia   ? Arthritis   ? Asthma   ? Back pain, chronic   ? GETS INJECTIONS IN BACK  ? Blind right eye   ? hemorrhage  ? Blood transfusion   ? Cancer [facility])   ? breast 1994  ? Clotting disorder ([place])   ? Diabetes mellitus without complication ([place])   ? takes precose  ? Elevated liver enzymes   ? Gallstones   ? GERD (gastroesophageal reflux disease)   ? HX: breast cancer   ? Hyperlipidemia   ? Hypertension   ? Hypothyroid   ? Lymphedema of arm   ? RT  ? Morbid obesity ([place])   ? OSA (obstructive sleep apnea)   ? has not used in 2 years-lost weight  ? Osteoporosis   ? Peripheral neuropathy   ? on gabapentin  ? PONV (postoperative nausea and vomiting)   ? Psoriasis   ? Pulmonary embolism ([place]) 1998 / 1994 /1968  ? Syncope and collapse 2011  ? due to anemia  ? ?Past Surgical History:  ?Procedure Laterality Date  ? ABDOMINAL HYSTERECTOMY    ? BONE MARROW TRANSPLANT  1994  ? CARDIOVASCULAR STRESS TEST  05/23/2005  ? EF 53%  ? carpal tunnell    ? bil  ? cataracts    ? CHOLECYSTECTOMY  05/10/2012  ? Procedure: LAPAROSCOPIC CHOLECYSTECTOMY WITH INTRAOPERATIVE CHOLANGIOGRAM;  Surgeon: Pedro Earls, MD;  Location: [facility];  Service: General;  Laterality: N/A;  Laparoscopic Cholecystectomy with Intraoperative Cholangiogram  ? GASTRIC BYPASS  2011  ? bariatric surgery  ? HIATAL HERNIA REPAIR    ? IVC filter    ?  recurrent DVT  ? KNEE ARTHROTOMY  1998  ? MASS EXCISION Right 11/24/2016  ? Procedure: EXCISION RIGHT AXILLARY LESION AND CHEST WALL LESION;  Surgeon: Johnathan Hausen, MD;  Location: [place];  Service: General;  Laterality: Right;  ? MASTECTOMY    ? Right  ? US ECHOCARDIOGRAPHY  10/27/08  ? EF 55-60%  ? ?Patient Active Problem List  ? Diagnosis Date Noted  ? Hypoglycemia following gastrointestinal surgery 03/19/2018  ? Edema of both lower extremities due to peripheral venous insufficiency 11/20/2017  ? Maxillary sinusitis, acute 08/31/2017  ? Encounter for therapeutic drug monitoring 08/26/2013  ? Insomnia 05/01/2013  ? Long term (current) use of anticoagulants 04/15/2013  ? Postsurgical dumping syndrome 04/06/2013  ? Bilateral leg edema 04/06/2013  ? Reactive hypoglycemia 04/02/2013  ? Abdominal pain intermittent-evalulating for internal hernia 11/07/2012  ? S/P laparoscopic cholecystectomy 05/31/2012  ? Roux Y Gastric Bypass April 2011; repair [facility] 11/22/2011  ? Unspecified deficiency anemia 11/14/2011  ? Left leg paresthesias 10/27/2010  ? Hypothyroid 10/27/2010  ? Low back pain 10/27/2010  ? Obstructive sleep apnea  09/29/2007  ? PULMONARY EMBOLISM 09/25/2007  ? Seasonal and perennial allergic rhinitis 09/25/2007  ? Allergic asthma, mild persistent, uncomplicated 54/65/0354  ? BREAST CANCER, HX OF 09/25/2007  ? ? ?REFERRING DIAG: Rt UE lymphedema ? ?THERAPY DIAG:  ?Postmastectomy lymphedema ? ?Secondary lymphedema ? ?PERTINENT HISTORY: PERTINENT HISTORY: Pt had right breast mastectomy in 1994. She had 10/13 LN's that were malignant. She had chemo and radiation as well as a bone marrow transplant in 1994. Went to a lymphedema clinic in [place], and had treatment here as well many years ago. Had a sleeve from 5 years ago but it is too tight. She takes Diurtetics and in the morning her legs are down a lot. Sometimes she feels swelling all over. Heart testing was OK ? ?PRECAUTIONS: Rt  lympehdema ? ?SUBJECTIVE: ?I fell last week and banged up my knees that's why I couldn't come on Thursday. I left Mondays wrap on until Wednesday morning.  I put my sleeve and glove on and I have worn it every day except Sunday I didn't wear it at all. sleeve feels comfortable now. ?PAIN:  ?Are you having pain? No, but my forearm is better now ? ? ? ?LYMPHEDEMA ASSESSMENTS:  ?  ?LANDMARK RIGHT  09/27/2021 Right 10/17/2021  ?10 cm proximal to olecranon process 40.8 39.7  ?Olecranon process 29.5 28.5  ?10 cm proximal to ulnar styloid process 31.9 30.0  ?Just proximal to ulnar styloid process 18.4 17.6  ?Across hand at thumb web space 19.5 19.6  ?At base of 2nd digit 6.0 5.7  ?(Blank rows = not tested) ?  ?  ?TODAY'S TREATMENT  ?10/17/2021 ?Pt came in without sleeve.  Remeasured with good results ?In supine: Short neck, 5 diaphragmatic breaths, Lt axillary nodes and establishment of anterior inter-axillary pathway, R inguinal nodes and establishment of Rt axillo-inguinal pathway, then R UE working proximal to distal, moving inner upper arm outwards and upwards, and doing both sides of forearms, spending extra time in any areas of fibrosis then retracing all steps reviewing with pt throughout   ?Pt was wrapped in standing at her request secondary to experiencing left hip spasms when sitting. ?Rt UE, tg soft size medium, Elastomull fingers (fixated with paper tape) 1-4 , [place] soft from wrist to axilla, then 1 6cm hand and forearm , 8 cm (herring bone here) bandage right forearm,, 1 10cm, elbow to axilla, and 1 12 short stretch compression bandages applied from wrist to axilla.  ?Pt asked to leave on several days if comfortable. and to wear sleeve if she has to take wrap off.   Will measure on Wed. ? ? ?10/10/2021 ?In supine: Short neck, 5 diaphragmatic breaths, Lt axillary nodes and establishment of anterior inter-axillary pathway, R inguinal nodes and establishment of Rt axillo-inguinal pathway, then R UE working  proximal to distal, moving inner upper arm outwards and upwards, and doing both sides of forearms, spending extra time in any areas of fibrosis then retracing all steps reviewing with pt throughout   ?Compression Bandaging:with new bandages as pt left hers at home; ?Rt UE, tg soft size medium, Elastomull fingers (fixated with paper tape) 1-4 , Ros[place] soft from wrist to axilla, then 1 6cm hand and forearm , 8 cm (herring bone here) bandage right forearm,, 1 10cm, elbow to axilla, and 1 12 short stretch compression bandages applied from wrist to axilla.  ?Pt asked to leave on several days if comfortable since she does not have any more appts; cancelled 2nd appt this week. And  to wear sleeve if she has to take wrap off.   ? ? ?10/05/21: ?Manual Therapy:  ?In supine: Short neck, 5 diaphragmatic breaths, Lt axillary nodes and establishment of anterior inter-axillary pathway, R inguinal nodes and establishment of Rt axillo-inguinal pathway, then R UE working proximal to distal, moving inner upper arm outwards and upwards, and doing both sides of forearms, spending extra time in any areas of fibrosis then retracing all steps reviewing with pt throughout   ?Compression Bandaging: ?Then applied cocoa butter to Rt UE, tg soft size medium, Elastomull fingers (fixated with paper tape) 1-4 with 1/2" gray compression foam to dorsal hand, Ros[place] soft from wrist to axilla, then 1 6cm hand and forearm (herring bone here) bandage, 1 8cm, 1 10cm, and 1 12 short stretch compression bandages applied from wrist to axilla.  ?Pt reports fingers with paper tape felt tighter than last session but no tingling and in continuing remedial exercises they were of tolerable pressure. Also assured pt they will cont to loosen as she conts to keep moving.   ? ?10/03/21: ?Manual Therapy:  ?In supine: Short neck, 5 diaphragmatic breaths, bil axillary nodes and establishment of interaxillary pathway, R inguinal nodes and establishment of axilloinguinal  pathway, then R UE working proximal to distal, moving inner upper arm outwards and upwards, and doing both sides of forearms, spending extra time in any areas of fibrosis then retracing all steps with education on reasons for

## 2021-10-19 ENCOUNTER — Ambulatory Visit: Payer: Medicare Other

## 2021-10-19 ENCOUNTER — Other Ambulatory Visit: Payer: Self-pay

## 2021-10-19 DIAGNOSIS — I890 Lymphedema, not elsewhere classified: Secondary | ICD-10-CM

## 2021-10-19 DIAGNOSIS — I972 Postmastectomy lymphedema syndrome: Secondary | ICD-10-CM

## 2021-10-19 NOTE — Therapy (Signed)
?OUTPATIENT PHYSICAL THERAPY TREATMENT NOTE ? ? ?Patient Name: Kelli Martinez ?MRN: [id number] ?DOB:1946/05/16, 76 y.o., female ?Today's Date: 10/19/2021 ? ?PCP: Billie Ruddy, MD ?REFERRING PROVIDER: Billie Ruddy, MD ? ? PT End of Session - 10/19/21 0904   ? ? Visit Number 6   ? Number of Visits 18   ? Date for PT Re-Evaluation 11/08/21   ? PT Start Time [phone]   ? PT Stop Time [phone]   ? PT Time Calculation (min) 49 min   ? Activity Tolerance Patient tolerated treatment well   ? Behavior During Therapy [facility] for tasks assessed/performed   ? ?  ?  ? ?  ? ? ?Past Medical History:  ?Diagnosis Date  ? Allergy   ? Anemia   ? Arthritis   ? Asthma   ? Back pain, chronic   ? GETS INJECTIONS IN BACK  ? Blind right eye   ? hemorrhage  ? Blood transfusion   ? Cancer [facility])   ? breast 1994  ? Clotting disorder ([place])   ? Diabetes mellitus without complication ([place])   ? takes precose  ? Elevated liver enzymes   ? Gallstones   ? GERD (gastroesophageal reflux disease)   ? HX: breast cancer   ? Hyperlipidemia   ? Hypertension   ? Hypothyroid   ? Lymphedema of arm   ? RT  ? Morbid obesity ([place])   ? OSA (obstructive sleep apnea)   ? has not used in 2 years-lost weight  ? Osteoporosis   ? Peripheral neuropathy   ? on gabapentin  ? PONV (postoperative nausea and vomiting)   ? Psoriasis   ? Pulmonary embolism ([place]) 1998 / 1994 /1968  ? Syncope and collapse 2011  ? due to anemia  ? ?Past Surgical History:  ?Procedure Laterality Date  ? ABDOMINAL HYSTERECTOMY    ? BONE MARROW TRANSPLANT  1994  ? CARDIOVASCULAR STRESS TEST  05/23/2005  ? EF 53%  ? carpal tunnell    ? bil  ? cataracts    ? CHOLECYSTECTOMY  05/10/2012  ? Procedure: LAPAROSCOPIC CHOLECYSTECTOMY WITH INTRAOPERATIVE CHOLANGIOGRAM;  Surgeon: Pedro Earls, MD;  [facility];  Service: General;  Laterality: N/A;  Laparoscopic Cholecystectomy with Intraoperative Cholangiogram  ? GASTRIC BYPASS  2011  ? bariatric surgery  ? HIATAL HERNIA REPAIR    ? IVC filter    ?  recurrent DVT  ? KNEE ARTHROTOMY  1998  ? MASS EXCISION Right 11/24/2016  ? Procedure: EXCISION RIGHT AXILLARY LESION AND CHEST WALL LESION;  Surgeon: Johnathan Hausen, MD;  Location: [place];  Service: General;  Laterality: Right;  ? MASTECTOMY    ? Right  ? US ECHOCARDIOGRAPHY  10/27/08  ? EF 55-60%  ? ?Patient Active Problem List  ? Diagnosis Date Noted  ? Hypoglycemia following gastrointestinal surgery 03/19/2018  ? Edema of both lower extremities due to peripheral venous insufficiency 11/20/2017  ? Maxillary sinusitis, acute 08/31/2017  ? Encounter for therapeutic drug monitoring 08/26/2013  ? Insomnia 05/01/2013  ? Long term (current) use of anticoagulants 04/15/2013  ? Postsurgical dumping syndrome 04/06/2013  ? Bilateral leg edema 04/06/2013  ? Reactive hypoglycemia 04/02/2013  ? Abdominal pain intermittent-evalulating for internal hernia 11/07/2012  ? S/P laparoscopic cholecystectomy 05/31/2012  ? Roux Y Gastric Bypass April 2011; repair [facility] 11/22/2011  ? Unspecified deficiency anemia 11/14/2011  ? Left leg paresthesias 10/27/2010  ? Hypothyroid 10/27/2010  ? Low back pain 10/27/2010  ? Obstructive sleep apnea  09/29/2007  ? PULMONARY EMBOLISM 09/25/2007  ? Seasonal and perennial allergic rhinitis 09/25/2007  ? Allergic asthma, mild persistent, uncomplicated 13/24/4010  ? BREAST CANCER, HX OF 09/25/2007  ? ? ?REFERRING DIAG: Rt UE lymphedema ? ?THERAPY DIAG:  ?Postmastectomy lymphedema ? ?Secondary lymphedema ? ?PERTINENT HISTORY: PERTINENT HISTORY: Pt had right breast mastectomy in 1994. She had 10/13 LN's that were malignant. She had chemo and radiation as well as a bone marrow transplant in 1994. Went to a lymphedema clinic in [place], and had treatment here as well many years ago. Had a sleeve from 5 years ago but it is too tight. She takes Diurtetics and in the morning her legs are down a lot. Sometimes she feels swelling all over. Heart testing was OK ? ?PRECAUTIONS: Rt  lympehdema ? ?SUBJECTIVE: ?I kept the wrap on until this am and I put my sleeve on with the glove. The finger wrap got loose on my index finger. ? ?PAIN:  ?Are you having pain? No, ? ? ? ?LYMPHEDEMA ASSESSMENTS:  ?  ?LANDMARK RIGHT  09/27/2021 Right 10/17/2021  ?10 cm proximal to olecranon process 40.8 39.7  ?Olecranon process 29.5 28.5  ?10 cm proximal to ulnar styloid process 31.9 30.0  ?Just proximal to ulnar styloid process 18.4 17.6  ?Across hand at thumb web space 19.5 19.6  ?At base of 2nd digit 6.0 5.7  ?(Blank rows = not tested) ?  ?  ?TODAY'S TREATMENT  ?10/19/2021 ?n supine: Short neck, 5 diaphragmatic breaths, Lt axillary nodes and establishment of anterior inter-axillary pathway, R inguinal nodes and establishment of Rt axillo-inguinal pathway, then R UE working proximal to distal, moving inner upper arm outwards and upwards, and doing both sides of forearms, spending extra time in any areas of fibrosis then retracing all steps reviewing with pt throughout   ?Compression bandaging to rRt UE, tg soft size medium, Elastomull fingers (fixated with paper tape) 1-4 , [place] soft from wrist to axilla, then 1 6cm hand and forearm , 8 cm (herring bone here) bandage right forearm,, 1 10cm, elbow to axilla, and 1 12 short stretch compression bandage applied from wrist to axilla.  ? ? ? ?10/17/2021 ?Pt came in without sleeve.  Remeasured with good results ?In supine: Short neck, 5 diaphragmatic breaths, Lt axillary nodes and establishment of anterior inter-axillary pathway, R inguinal nodes and establishment of Rt axillo-inguinal pathway, then R UE working proximal to distal, moving inner upper arm outwards and upwards, and doing both sides of forearms, spending extra time in any areas of fibrosis then retracing all steps reviewing with pt throughout   ?Pt was wrapped in standing at her request secondary to experiencing left hip spasms when sitting. ?Rt UE, tg soft size medium, Elastomull fingers (fixated with paper  tape) 1-4 , [place] soft from wrist to axilla, then 1 6cm hand and forearm , 8 cm (herring bone here) bandage right forearm,, 1 10cm, elbow to axilla, and 1 12 short stretch compression bandages applied from wrist to axilla.  ?Pt asked to leave on several days if comfortable. and to wear sleeve if she has to take wrap off.   Will measure on Wed. ? ? ?10/10/2021 ?In supine: Short neck, 5 diaphragmatic breaths, Lt axillary nodes and establishment of anterior inter-axillary pathway, R inguinal nodes and establishment of Rt axillo-inguinal pathway, then R UE working proximal to distal, moving inner upper arm outwards and upwards, and doing both sides of forearms, spending extra time in any areas of fibrosis then retracing all  steps reviewing with pt throughout   ?Compression Bandaging:with new bandages as pt left hers at home; ?Rt UE, tg soft size medium, Elastomull fingers (fixated with paper tape) 1-4 , [place] soft from wrist to axilla, then 1 6cm hand and forearm , 8 cm (herring bone here) bandage right forearm,, 1 10cm, elbow to axilla, and 1 12 short stretch compression bandages applied from wrist to axilla.  ?Pt asked to leave on several days if comfortable since she does not have any more appts; cancelled 2nd appt this week. And to wear sleeve if she has to take wrap off.   ? ? ?10/05/21: ?Manual Therapy:  ?In supine: Short neck, 5 diaphragmatic breaths, Lt axillary nodes and establishment of anterior inter-axillary pathway, R inguinal nodes and establishment of Rt axillo-inguinal pathway, then R UE working proximal to distal, moving inner upper arm outwards and upwards, and doing both sides of forearms, spending extra time in any areas of fibrosis then retracing all steps reviewing with pt throughout   ?Compression Bandaging: ?Then applied cocoa butter to Rt UE, tg soft size medium, Elastomull fingers (fixated with paper tape) 1-4 with 1/2" gray compression foam to dorsal hand, [place] soft from wrist to axilla,  then 1 6cm hand and forearm (herring bone here) bandage, 1 8cm, 1 10cm, and 1 12 short stretch compression bandages applied from wrist to axilla.  ?Pt reports fingers with paper tape felt tighter than last session but

## 2021-10-21 ENCOUNTER — Ambulatory Visit: Payer: Medicare Other

## 2021-10-21 ENCOUNTER — Other Ambulatory Visit: Payer: Self-pay

## 2021-10-21 DIAGNOSIS — I972 Postmastectomy lymphedema syndrome: Secondary | ICD-10-CM

## 2021-10-21 DIAGNOSIS — I890 Lymphedema, not elsewhere classified: Secondary | ICD-10-CM | POA: Diagnosis not present

## 2021-10-21 NOTE — Therapy (Signed)
?OUTPATIENT PHYSICAL THERAPY TREATMENT NOTE ? ? ?Patient Name: Kelli Martinez ?MRN: [id number] ?DOB:03/19/1946, 76 y.o., female ?Today's Date: 10/21/2021 ? ?PCP: Billie Ruddy, MD ?REFERRING PROVIDER: Billie Ruddy, MD ? ? PT End of Session - 10/21/21 1006   ? ? Visit Number 7   ? Number of Visits 18   ? Date for PT Re-Evaluation 11/08/21   ? PT Start Time [phone]   ? PT Stop Time 1004   ? PT Time Calculation (min) 55 min   ? Activity Tolerance Patient tolerated treatment well   ? Behavior During Therapy [facility] for tasks assessed/performed   ? ?  ?  ? ?  ? ? ? ?Past Medical History:  ?Diagnosis Date  ? Allergy   ? Anemia   ? Arthritis   ? Asthma   ? Back pain, chronic   ? GETS INJECTIONS IN BACK  ? Blind right eye   ? hemorrhage  ? Blood transfusion   ? Cancer [facility])   ? breast 1994  ? Clotting disorder ([place])   ? Diabetes mellitus without complication ([place])   ? takes precose  ? Elevated liver enzymes   ? Gallstones   ? GERD (gastroesophageal reflux disease)   ? HX: breast cancer   ? Hyperlipidemia   ? Hypertension   ? Hypothyroid   ? Lymphedema of arm   ? RT  ? Morbid obesity ([place])   ? OSA (obstructive sleep apnea)   ? has not used in 2 years-lost weight  ? Osteoporosis   ? Peripheral neuropathy   ? on gabapentin  ? PONV (postoperative nausea and vomiting)   ? Psoriasis   ? Pulmonary embolism ([place]) 1998 / 1994 /1968  ? Syncope and collapse 2011  ? due to anemia  ? ?Past Surgical History:  ?Procedure Laterality Date  ? ABDOMINAL HYSTERECTOMY    ? BONE MARROW TRANSPLANT  1994  ? CARDIOVASCULAR STRESS TEST  05/23/2005  ? EF 53%  ? carpal tunnell    ? bil  ? cataracts    ? CHOLECYSTECTOMY  05/10/2012  ? Procedure: LAPAROSCOPIC CHOLECYSTECTOMY WITH INTRAOPERATIVE CHOLANGIOGRAM;  Surgeon: Pedro Earls, MD;  Location: [facility];  Service: General;  Laterality: N/A;  Laparoscopic Cholecystectomy with Intraoperative Cholangiogram  ? GASTRIC BYPASS  2011  ? bariatric surgery  ? HIATAL HERNIA REPAIR    ? IVC filter     ? recurrent DVT  ? KNEE ARTHROTOMY  1998  ? MASS EXCISION Right 11/24/2016  ? Procedure: EXCISION RIGHT AXILLARY LESION AND CHEST WALL LESION;  Surgeon: Johnathan Hausen, MD;  Location: [place];  Service: General;  Laterality: Right;  ? MASTECTOMY    ? Right  ? US ECHOCARDIOGRAPHY  10/27/08  ? EF 55-60%  ? ?Patient Active Problem List  ? Diagnosis Date Noted  ? Hypoglycemia following gastrointestinal surgery 03/19/2018  ? Edema of both lower extremities due to peripheral venous insufficiency 11/20/2017  ? Maxillary sinusitis, acute 08/31/2017  ? Encounter for therapeutic drug monitoring 08/26/2013  ? Insomnia 05/01/2013  ? Long term (current) use of anticoagulants 04/15/2013  ? Postsurgical dumping syndrome 04/06/2013  ? Bilateral leg edema 04/06/2013  ? Reactive hypoglycemia 04/02/2013  ? Abdominal pain intermittent-evalulating for internal hernia 11/07/2012  ? S/P laparoscopic cholecystectomy 05/31/2012  ? Roux Y Gastric Bypass April 2011; repair [facility] 11/22/2011  ? Unspecified deficiency anemia 11/14/2011  ? Left leg paresthesias 10/27/2010  ? Hypothyroid 10/27/2010  ? Low back pain 10/27/2010  ? Obstructive sleep  apnea 09/29/2007  ? PULMONARY EMBOLISM 09/25/2007  ? Seasonal and perennial allergic rhinitis 09/25/2007  ? Allergic asthma, mild persistent, uncomplicated 41/74/0814  ? BREAST CANCER, HX OF 09/25/2007  ? ? ?REFERRING DIAG: Rt UE lymphedema ? ?THERAPY DIAG:  ?Postmastectomy lymphedema ? ?Secondary lymphedema ? ?PERTINENT HISTORY: PERTINENT HISTORY: Pt had right breast mastectomy in 1994. She had 10/13 LN's that were malignant. She had chemo and radiation as well as a bone marrow transplant in 1994. Went to a lymphedema clinic in [place], and had treatment here as well many years ago. Had a sleeve from 5 years ago but it is too tight. She takes Diurtetics and in the morning her legs are down a lot. Sometimes she feels swelling all over. Heart testing was OK ? ?PRECAUTIONS: Rt  lympehdema ? ?SUBJECTIVE: ?I kept the wrap on until this am and I put my sleeve on with the glove. The finger wrap got loose on my index finger. ? ?PAIN:  ?Are you having pain? No, ? ? ? ?LYMPHEDEMA ASSESSMENTS:  ?  ?LANDMARK RIGHT  09/27/2021 Right 10/17/2021 Right 10/21/21  ?10 cm proximal to olecranon process 40.8 39.7 37.5  ?Olecranon process 29.5 28.5 28.1  ?10 cm proximal to ulnar styloid process 31.9 30.0 28.4  ?Just proximal to ulnar styloid process 18.4 17.6 16.7  ?Across hand at thumb web space 19.5 19.6 18.6  ?At base of 2nd digit 6.0 5.7 5.6  ?(Blank rows = not tested) ?  ?  ?TODAY'S TREATMENT  ?10/21/21: ?In supine: Short neck, 5 diaphragmatic breaths, Lt axillary nodes and establishment of anterior inter-axillary pathway, R inguinal nodes and establishment of Rt axillo-inguinal pathway, then R UE working proximal to distal, moving inner upper arm outwards and upwards, and doing both sides of forearms, spending extra time in any areas of fibrosis then retracing all steps reviewing with pt throughout  and having her return demo.  ?Did not bandage pt today as she forgot her bandages again so donned her old compression sleeve and glove at end of session.  ? ?10/19/2021 ?In supine: Short neck, 5 diaphragmatic breaths, Lt axillary nodes and establishment of anterior inter-axillary pathway, R inguinal nodes and establishment of Rt axillo-inguinal pathway, then R UE working proximal to distal, moving inner upper arm outwards and upwards, and doing both sides of forearms, spending extra time in any areas of fibrosis then retracing all steps reviewing with pt throughout   ?Compression bandaging to rRt UE, tg soft size medium, Elastomull fingers (fixated with paper tape) 1-4 , Rosidal soft from wrist to axilla, then 1 6cm hand and forearm , 8 cm (herring bone here) bandage right forearm,, 1 10cm, elbow to axilla, and 1 12 short stretch compression bandage applied from wrist to axilla.  ? ? ?10/17/2021 ?Pt came in  without sleeve.  Remeasured with good results ?In supine: Short neck, 5 diaphragmatic breaths, Lt axillary nodes and establishment of anterior inter-axillary pathway, R inguinal nodes and establishment of Rt axillo-inguinal pathway, then R UE working proximal to distal, moving inner upper arm outwards and upwards, and doing both sides of forearms, spending extra time in any areas of fibrosis then retracing all steps reviewing with pt throughout   ?Pt was wrapped in standing at her request secondary to experiencing left hip spasms when sitting. ?Rt UE, tg soft size medium, Elastomull fingers (fixated with paper tape) 1-4 , Rosidal soft from wrist to axilla, then 1 6cm hand and forearm , 8 cm (herring bone here) bandage right forearm,, 1  10cm, elbow to axilla, and 1 12 short stretch compression bandages applied from wrist to axilla.  ?Pt asked to leave on several days if comfortable. and to wear sleeve if she has to take wrap off.   Will measure on Wed. ? ? ? ?  ?PATIENT EDUCATION:  ?10/21/21: ?Education details: Self MLD ?Person educated: Patient ?Education method: Explanation, Demo, Handout ?Education comprehension: verbalized understanding and returned demo ?  ?  ?[place]: ?Self MLD, see below ?  ?ASSESSMENT: ?  ?CLINICAL IMPRESSION: ? Continued MLD and educated pt in this while performing today since could not bandage as she forgot her bandages today. She did very well with return of demonstration just requiring mild VCs for lighter pressure and to stretch skin, not slide. She was able to return improved demo after cuing. She is going to try wrapping herself at home this weekend though as she reports possibly remembering how to do this. Upper arm was softer today and appeared less swollen.  ? ? ?OBJECTIVE IMPAIRMENTS decreased knowledge of condition, decreased ROM, increased edema, postural dysfunction, and pain.  ?   ?  ?GOALS: ?LONG TERM GOALS:  ?  ?LTG Name Target Date Goal status  ?1 Pt will be  independent with compression bandaging to reduce UE lymphedema ?Baseline: 11/08/2021 INITIAL  ?2 Pt will have reduction at 10 cm prox to ulna by 4-6 cm to reduce heaviness of UE ?Baseline: 11/08/2021 INITIAL  ?3 Pt will be

## 2021-10-21 NOTE — Patient Instructions (Addendum)
?  Start with circles near neck, placing hands above collarbones. 5 times each side.   Cancer Rehab [phone] ? ?Deep Effective Breath ? ? ?Standing, sitting, or laying down, place both hands on the belly. Take a deep breath IN, expanding the belly; then breath OUT, contracting the belly. ?Repeat __5__ times. Do __2-3__ sessions per day and before your self massage. ? ?Axilla to Axilla - Sweep ? ? ?On uninvolved side make 5 circles in the armpit, then pump _5__ times from involved armpit across chest to uninvolved armpit, making a pathway. ?Do _1__ time per day. ? ?Axilla to Inguinal Nodes - Sweep ? ? ?On involved side, make 5 circles at groin at panty line, then pump _5__ times from armpit along side of trunk to outer hip, making your other pathway. ?Do __1_ time per day. ? ?Arm Posterior: Elbow to Shoulder - Sweep ? ? ?Pump _5__ times from back of elbow to top of shoulder. Then inner to outer upper arm _5_ times, then outer arm again _5_ times. Then back to the pathways _2-3_ times. ?Do _1__ time per day. ? ?ARM: [place]olar Wrist to Elbow - Sweep ? ? ?Pump or stationary circles _5__ times from wrist to elbow making sure to do both sides of the forearm. Then retrace your steps to the outer arm, and the pathways _2-3_ times each. ?Do _1__ time per day. ? ?ARM: Dorsum of Hand to Shoulder - Sweep ? ? ?Pump or stationary circles _5__ times on back of hand including knuckle spaces and individual fingers if needed working up towards the wrist, then retrace all your steps working back up the forearm, doing both sides; upper outer arm and back to your pathways _2-3_ times each. Then do 5 circles again at uninvolved armpit and involved groin where you started! Good job!! ?Do __1_ time per day. ? ? ? ?  ?

## 2021-10-24 ENCOUNTER — Ambulatory Visit: Payer: Medicare Other

## 2021-10-24 ENCOUNTER — Other Ambulatory Visit: Payer: Self-pay

## 2021-10-24 DIAGNOSIS — I972 Postmastectomy lymphedema syndrome: Secondary | ICD-10-CM | POA: Diagnosis not present

## 2021-10-24 DIAGNOSIS — I890 Lymphedema, not elsewhere classified: Secondary | ICD-10-CM | POA: Diagnosis not present

## 2021-10-24 NOTE — Therapy (Signed)
?OUTPATIENT PHYSICAL THERAPY TREATMENT NOTE ? ? ?Patient Name: Kelli Martinez ?MRN: [id number] ?DOB:Dec 22, 1945, 76 y.o., female ?Today's Date: 10/24/2021 ? ?PCP: Billie Ruddy, MD ?REFERRING PROVIDER: Billie Ruddy, MD ? ? PT End of Session - 10/24/21 0901   ? ? Visit Number 8   ? Number of Visits 18   ? Date for PT Re-Evaluation 11/08/21   ? PT Start Time 0902   ? PT Stop Time 1003   ? PT Time Calculation (min) 61 min   ? Activity Tolerance Patient tolerated treatment well   ? Behavior During Therapy [facility] for tasks assessed/performed   ? ?  ?  ? ?  ? ? ? ?Past Medical History:  ?Diagnosis Date  ? Allergy   ? Anemia   ? Arthritis   ? Asthma   ? Back pain, chronic   ? GETS INJECTIONS IN BACK  ? Blind right eye   ? hemorrhage  ? Blood transfusion   ? Cancer [facility])   ? breast 1994  ? Clotting disorder ([place])   ? Diabetes mellitus without complication ([place])   ? takes precose  ? Elevated liver enzymes   ? Gallstones   ? GERD (gastroesophageal reflux disease)   ? HX: breast cancer   ? Hyperlipidemia   ? Hypertension   ? Hypothyroid   ? Lymphedema of arm   ? RT  ? Morbid obesity ([place])   ? OSA (obstructive sleep apnea)   ? has not used in 2 years-lost weight  ? Osteoporosis   ? Peripheral neuropathy   ? on gabapentin  ? PONV (postoperative nausea and vomiting)   ? Psoriasis   ? Pulmonary embolism ([place]) 1998 / 1994 /1968  ? Syncope and collapse 2011  ? due to anemia  ? ?Past Surgical History:  ?Procedure Laterality Date  ? ABDOMINAL HYSTERECTOMY    ? BONE MARROW TRANSPLANT  1994  ? CARDIOVASCULAR STRESS TEST  05/23/2005  ? EF 53%  ? carpal tunnell    ? bil  ? cataracts    ? CHOLECYSTECTOMY  05/10/2012  ? Procedure: LAPAROSCOPIC CHOLECYSTECTOMY WITH INTRAOPERATIVE CHOLANGIOGRAM;  Surgeon: Pedro Earls, MD;  [facility];  Service: General;  Laterality: N/A;  Laparoscopic Cholecystectomy with Intraoperative Cholangiogram  ? GASTRIC BYPASS  2011  ? bariatric surgery  ? HIATAL HERNIA REPAIR    ? IVC filter     ? recurrent DVT  ? KNEE ARTHROTOMY  1998  ? MASS EXCISION Right 11/24/2016  ? Procedure: EXCISION RIGHT AXILLARY LESION AND CHEST WALL LESION;  Surgeon: Johnathan Hausen, MD;  Location: [place];  Service: General;  Laterality: Right;  ? MASTECTOMY    ? Right  ? US ECHOCARDIOGRAPHY  10/27/08  ? EF 55-60%  ? ?Patient Active Problem List  ? Diagnosis Date Noted  ? Hypoglycemia following gastrointestinal surgery 03/19/2018  ? Edema of both lower extremities due to peripheral venous insufficiency 11/20/2017  ? Maxillary sinusitis, acute 08/31/2017  ? Encounter for therapeutic drug monitoring 08/26/2013  ? Insomnia 05/01/2013  ? Long term (current) use of anticoagulants 04/15/2013  ? Postsurgical dumping syndrome 04/06/2013  ? Bilateral leg edema 04/06/2013  ? Reactive hypoglycemia 04/02/2013  ? Abdominal pain intermittent-evalulating for internal hernia 11/07/2012  ? S/P laparoscopic cholecystectomy 05/31/2012  ? Roux Y Gastric Bypass April 2011; repair [facility] 11/22/2011  ? Unspecified deficiency anemia 11/14/2011  ? Left leg paresthesias 10/27/2010  ? Hypothyroid 10/27/2010  ? Low back pain 10/27/2010  ? Obstructive sleep  apnea 09/29/2007  ? PULMONARY EMBOLISM 09/25/2007  ? Seasonal and perennial allergic rhinitis 09/25/2007  ? Allergic asthma, mild persistent, uncomplicated 93/26/7124  ? BREAST CANCER, HX OF 09/25/2007  ? ? ?REFERRING DIAG: Rt UE lymphedema ? ?THERAPY DIAG:  ?Postmastectomy lymphedema ? ?Secondary lymphedema ? ?PERTINENT HISTORY: PERTINENT HISTORY: Pt had right breast mastectomy in 1994. She had 10/13 LN's that were malignant. She had chemo and radiation as well as a bone marrow transplant in 1994. Went to a lymphedema clinic in [place], and had treatment here as well many years ago. Had a sleeve from 5 years ago but it is too tight. She takes Diurtetics and in the morning her legs are down a lot. Sometimes she feels swelling all over. Heart testing was OK ? ?PRECAUTIONS: Rt  lympehdema ? ?SUBJECTIVE: ?I wrapped Saturday night and I think I did well. I took it off to go to church and I wore the compression sleeveall day, but I took it off at night.  I did the MLD like she told me. I didn't wrap it last night because I was tired. ? ? ?PAIN:  ?Are you having pain? No, ? ? ? ?LYMPHEDEMA ASSESSMENTS:  ?  ?LANDMARK RIGHT  09/27/2021 Right 10/17/2021 Right 10/21/21  ?10 cm proximal to olecranon process 40.8 39.7 37.5  ?Olecranon process 29.5 28.5 28.1  ?10 cm proximal to ulnar styloid process 31.9 30.0 28.4  ?Just proximal to ulnar styloid process 18.4 17.6 16.7  ?Across hand at thumb web space 19.5 19.6 18.6  ?At base of 2nd digit 6.0 5.7 5.6  ?(Blank rows = not tested) ?  ?  ?TODAY'S TREATMENT  ?10/24/2021 ?Pt felt like she did well with wrapping and MLD over the weekend ?In supine: Short neck, 5 diaphragmatic breaths, Lt axillary nodes and establishment of anterior inter-axillary pathway, R inguinal nodes and establishment of Rt axillo-inguinal pathway, then R UE working proximal to distal, moving inner upper arm outwards and upwards, and doing both sides of forearms, spending extra time in any areas of fibrosis then retracing all steps  Therapist did all MLD today as pt had a bad headache. ?Compression bandaging to rRt UE, tg soft size medium, Elastomull fingers (fixated with paper tape) 1-4 , Rosidal soft from wrist to axilla, then 1 6cm hand and forearm , 8 cm (herring bone here) bandage right forearm,, 1 10cm, elbow to axilla, and 1 12 short stretch compression bandage applied from wrist to axilla. ?Pt to be measured on Wednesday;trying to move AM appt to PM ? ?10/21/21: ?In supine: Short neck, 5 diaphragmatic breaths, Lt axillary nodes and establishment of anterior inter-axillary pathway, R inguinal nodes and establishment of Rt axillo-inguinal pathway, then R UE working proximal to distal, moving inner upper arm outwards and upwards, and doing both sides of forearms, spending extra time in  any areas of fibrosis then retracing all steps reviewing with pt throughout  and having her return demo.  ?Did not bandage pt today as she forgot her bandages again so donned her old compression sleeve and glove at end of session.  ? ?10/19/2021 ?In supine: Short neck, 5 diaphragmatic breaths, Lt axillary nodes and establishment of anterior inter-axillary pathway, R inguinal nodes and establishment of Rt axillo-inguinal pathway, then R UE working proximal to distal, moving inner upper arm outwards and upwards, and doing both sides of forearms, spending extra time in any areas of fibrosis then retracing all steps reviewing with pt throughout   ?Compression bandaging to rRt UE, tg soft  size medium, Elastomull fingers (fixated with paper tape) 1-4 , Rosidal soft from wrist to axilla, then 1 6cm hand and forearm , 8 cm (herring bone here) bandage right forearm,, 1 10cm, elbow to axilla, and 1 12 short stretch compression bandage applied from wrist to axilla.  ? ? ?10/17/2021 ?Pt came in without sleeve.  Remeasured with good results ?In supine: Short neck, 5 diaphragmatic breaths, Lt axillary nodes and establishment of anterior inter-axillary pathway, R inguinal nodes and establishment of Rt axillo-inguinal pathway, then R UE working proximal to distal, moving inner upper arm outwards and upwards, and doing both sides of forearms, spending extra time in any areas of fibrosis then retracing all steps reviewing with pt throughout   ?Pt was wrapped in standing at her request secondary to experiencing left hip spasms when sitting. ?Rt UE, tg soft size medium, Elastomull fingers (fixated with paper tape) 1-4 , Rosidal soft from wrist to axilla, then 1 6cm hand and forearm , 8 cm (herring bone here) bandage right forearm,, 1 10cm, elbow to axilla, and 1 12 short stretch compression bandages applied from wrist to axilla.  ?Pt asked to leave on several days if comfortable. and to wear sleeve if she has to take wrap off.   Will  measure on Wed. ? ? ? ?  ?PATIENT EDUCATION:  ?10/21/21: ?Education details: Self MLD ?Person educated: Patient ?Education method: Explanation, Demo, Handout ?Education comprehension: verbalized understanding and retu

## 2021-10-26 ENCOUNTER — Ambulatory Visit: Payer: Medicare Other

## 2021-10-26 ENCOUNTER — Other Ambulatory Visit: Payer: Self-pay

## 2021-10-26 DIAGNOSIS — I890 Lymphedema, not elsewhere classified: Secondary | ICD-10-CM

## 2021-10-26 DIAGNOSIS — I972 Postmastectomy lymphedema syndrome: Secondary | ICD-10-CM

## 2021-10-26 NOTE — Therapy (Signed)
Pt was measured by [facility] this afternoon.  Because I had an opening I was able to wrap her after her visit. ?No charge for afternoon visit. ?Cheral Almas, PT ?

## 2021-10-26 NOTE — Therapy (Signed)
?OUTPATIENT PHYSICAL THERAPY TREATMENT NOTE ? ? ?Patient Name: Kelli Martinez ?MRN: [id number] ?DOB:04-25-1946, 76 y.o., female ?Today's Date: 10/26/2021 ? ?PCP: Billie Ruddy, MD ?REFERRING PROVIDER: Billie Ruddy, MD ? ? PT End of Session - 10/26/21 1000   ? ? Visit Number 9   ? Number of Visits 18   ? Date for PT Re-Evaluation 11/08/21   ? PT Start Time [id number]   ? PT Stop Time 1000   ? PT Time Calculation (min) 55 min   ? Activity Tolerance Patient tolerated treatment well   ? Behavior During Therapy [facility] for tasks assessed/performed   ? ?  ?  ? ?  ? ? ? ?Past Medical History:  ?Diagnosis Date  ? Allergy   ? Anemia   ? Arthritis   ? Asthma   ? Back pain, chronic   ? GETS INJECTIONS IN BACK  ? Blind right eye   ? hemorrhage  ? Blood transfusion   ? Cancer [facility])   ? breast 1994  ? Clotting disorder ([place])   ? Diabetes mellitus without complication ([place])   ? takes precose  ? Elevated liver enzymes   ? Gallstones   ? GERD (gastroesophageal reflux disease)   ? HX: breast cancer   ? Hyperlipidemia   ? Hypertension   ? Hypothyroid   ? Lymphedema of arm   ? RT  ? Morbid obesity ([place])   ? OSA (obstructive sleep apnea)   ? has not used in 2 years-lost weight  ? Osteoporosis   ? Peripheral neuropathy   ? on gabapentin  ? PONV (postoperative nausea and vomiting)   ? Psoriasis   ? Pulmonary embolism ([place]) 1998 / 1994 /1968  ? Syncope and collapse 2011  ? due to anemia  ? ?Past Surgical History:  ?Procedure Laterality Date  ? ABDOMINAL HYSTERECTOMY    ? BONE MARROW TRANSPLANT  1994  ? CARDIOVASCULAR STRESS TEST  05/23/2005  ? EF 53%  ? carpal tunnell    ? bil  ? cataracts    ? CHOLECYSTECTOMY  05/10/2012  ? Procedure: LAPAROSCOPIC CHOLECYSTECTOMY WITH INTRAOPERATIVE CHOLANGIOGRAM;  Surgeon: Pedro Earls, MD;  Location: [facility];  Service: General;  Laterality: N/A;  Laparoscopic Cholecystectomy with Intraoperative Cholangiogram  ? GASTRIC BYPASS  2011  ? bariatric surgery  ? HIATAL HERNIA REPAIR    ? IVC filter     ? recurrent DVT  ? KNEE ARTHROTOMY  1998  ? MASS EXCISION Right 11/24/2016  ? Procedure: EXCISION RIGHT AXILLARY LESION AND CHEST WALL LESION;  Surgeon: Johnathan Hausen, MD;  Location: [facility];  Service: General;  Laterality: Right;  ? MASTECTOMY    ? Right  ? US ECHOCARDIOGRAPHY  10/27/08  ? EF 55-60%  ? ?Patient Active Problem List  ? Diagnosis Date Noted  ? Hypoglycemia following gastrointestinal surgery 03/19/2018  ? Edema of both lower extremities due to peripheral venous insufficiency 11/20/2017  ? Maxillary sinusitis, acute 08/31/2017  ? Encounter for therapeutic drug monitoring 08/26/2013  ? Insomnia 05/01/2013  ? Long term (current) use of anticoagulants 04/15/2013  ? Postsurgical dumping syndrome 04/06/2013  ? Bilateral leg edema 04/06/2013  ? Reactive hypoglycemia 04/02/2013  ? Abdominal pain intermittent-evalulating for internal hernia 11/07/2012  ? S/P laparoscopic cholecystectomy 05/31/2012  ? Roux Y Gastric Bypass April 2011; repair [facility] 11/22/2011  ? Unspecified deficiency anemia 11/14/2011  ? Left leg paresthesias 10/27/2010  ? Hypothyroid 10/27/2010  ? Low back pain 10/27/2010  ? Obstructive sleep  apnea 09/29/2007  ? PULMONARY EMBOLISM 09/25/2007  ? Seasonal and perennial allergic rhinitis 09/25/2007  ? Allergic asthma, mild persistent, uncomplicated 47/03/6282  ? BREAST CANCER, HX OF 09/25/2007  ? ? ?REFERRING DIAG: Rt UE lymphedema ? ?THERAPY DIAG:  ?Postmastectomy lymphedema ? ?Secondary lymphedema ? ?PERTINENT HISTORY: PERTINENT HISTORY: Pt had right breast mastectomy in 1994. She had 10/13 LN's that were malignant. She had chemo and radiation as well as a bone marrow transplant in 1994. Went to a lymphedema clinic in [place], and had treatment here as well many years ago. Had a sleeve from 5 years ago but it is too tight. She takes Diurtetics and in the morning her legs are down a lot. Sometimes she feels swelling all over. Heart testing was OK ? ?PRECAUTIONS: Rt  lympehdema ? ?SUBJECTIVE: ?I took the bandage off yesterday morning  at 5:00 because my elbow and upper arm hurt. In the morning I put on my sleeve. Yesterday when I took my sleeve off I noticed swelling on the right side of my body under my arm ? ?PAIN:  ?Are you having pain? No, ? ? ? ?LYMPHEDEMA ASSESSMENTS:  ?  ?LANDMARK RIGHT  09/27/2021 Right 10/17/2021 Right 10/21/21 10/26/2021  ?10 cm proximal to olecranon process 40.8 39.7 37.5 38.9  ?Olecranon process 29.5 28.5 28.1 28.2  ?10 cm proximal to ulnar styloid process 31.9 30.0 28.4 28.5  ?Just proximal to ulnar styloid process 18.4 17.6 16.7 17.3  ?Across hand at thumb web space 19.5 19.6 18.6 19.5  ?At base of 2nd digit 6.0 5.7 5.6 5.5.7  ?(Blank rows = not tested) ?  ?  ?TODAY'S TREATMENT  ?10/26/2021 ?Pt came in wearing sleeve and glove. Removed and arm remeasured;slight increases throughout ?In supine: Short neck, 5 diaphragmatic breaths, Lt axillary nodes and establishment of anterior inter-axillary pathway, R inguinal nodes and establishment of Rt axillo-inguinal pathway, then R UE working proximal to distal, moving inner upper arm outwards and upwards, and doing both sides of forearms, spending extra time in any areas of fibrosis then retracing all steps. Slight Right lateral trunk edema noted today which is intermittent.  ?Compression bandaging to Rt UE, tg soft size medium, Elastomull fingers (fixated with paper tape) 1-4 , Rosidal soft from wrist to axilla, then 1 6cm hand and forearm , 8 cm (herring bone here) bandage right forearm,, 1 10cm, elbow to axilla, and 1 12 short stretch compression bandage applied from wrist to axilla. ?Pt wrapped because she is being measured this afternoon for garments and measurements with slight increases ? ? ?10/24/2021 ?Pt felt like she did well with wrapping and MLD over the weekend ?In supine: Short neck, 5 diaphragmatic breaths, Lt axillary nodes and establishment of anterior inter-axillary pathway, R inguinal nodes and  establishment of Rt axillo-inguinal pathway, then R UE working proximal to distal, moving inner upper arm outwards and upwards, and doing both sides of forearms, spending extra time in any areas of fibrosis then retracing all steps  Therapist did all MLD today as pt had a bad headache. ?Compression bandaging to rRt UE, tg soft size medium, Elastomull fingers (fixated with paper tape) 1-4 , Rosidal soft from wrist to axilla, then 1 6cm hand and forearm , 8 cm (herring bone here) bandage right forearm,, 1 10cm, elbow to axilla, and 1 12 short stretch compression bandage applied from wrist to axilla. ?Pt to be measured on Wednesday;trying to move AM appt to PM ? ?10/21/21: ?In supine: Short neck, 5 diaphragmatic breaths, Lt axillary  nodes and establishment of anterior inter-axillary pathway, R inguinal nodes and establishment of Rt axillo-inguinal pathway, then R UE working proximal to distal, moving inner upper arm outwards and upwards, and doing both sides of forearms, spending extra time in any areas of fibrosis then retracing all steps reviewing with pt throughout  and having her return demo.  ?Did not bandage pt today as she forgot her bandages again so donned her old compression sleeve and glove at end of session.  ? ?10/19/2021 ?In supine: Short neck, 5 diaphragmatic breaths, Lt axillary nodes and establishment of anterior inter-axillary pathway, R inguinal nodes and establishment of Rt axillo-inguinal pathway, then R UE working proximal to distal, moving inner upper arm outwards and upwards, and doing both sides of forearms, spending extra time in any areas of fibrosis then retracing all steps reviewing with pt throughout   ?Compression bandaging to rRt UE, tg soft size medium, Elastomull fingers (fixated with paper tape) 1-4 , Rosidal soft from wrist to axilla, then 1 6cm hand and forearm , 8 cm (herring bone here) bandage right forearm,, 1 10cm, elbow to axilla, and 1 12 short stretch compression bandage  applied from wrist to axilla.  ? ? ?10/17/2021 ?Pt came in without sleeve.  Remeasured with good results ?In supine: Short neck, 5 diaphragmatic breaths, Lt axillary nodes and establishment of anterior inter-axillary pat

## 2021-10-27 ENCOUNTER — Ambulatory Visit: Payer: Medicare Other

## 2021-10-31 ENCOUNTER — Ambulatory Visit: Payer: Medicare Other | Attending: Family Medicine | Admitting: Rehabilitation

## 2021-10-31 ENCOUNTER — Encounter: Payer: Self-pay | Admitting: Rehabilitation

## 2021-10-31 DIAGNOSIS — I890 Lymphedema, not elsewhere classified: Secondary | ICD-10-CM | POA: Diagnosis not present

## 2021-10-31 DIAGNOSIS — I972 Postmastectomy lymphedema syndrome: Secondary | ICD-10-CM | POA: Diagnosis not present

## 2021-10-31 NOTE — Therapy (Signed)
?OUTPATIENT PHYSICAL THERAPY TREATMENT NOTE ? ? ?Patient Name: Kelli Martinez ?MRN: [id number] ?DOB:11-02-1945, 76 y.o., female ?Today's Date: 10/31/2021 ? ?PCP: Billie Ruddy, MD ?REFERRING PROVIDER: Billie Ruddy, MD ? ? PT End of Session - 10/31/21 0955   ? ? Visit Number 10   ? Number of Visits 18   ? Date for PT Re-Evaluation 11/08/21   ? PT Start Time 1000   ? PT Stop Time 1055   ? PT Time Calculation (min) 55 min   ? Activity Tolerance Patient tolerated treatment well   ? Behavior During Therapy [facility] for tasks assessed/performed   ? ?  ?  ? ?  ? ?Physical Therapy Progress Note ? ?Dates of Reporting Period: 10/03/21  to 10/31/21 ? ?Objective Reports of Subjective Statement: see below ? ?Objective Measurements: see below ? ?Goal Update: see below ? ?Plan: continue CDT untl compression garment arrives ? ?Reason Skilled Services are Required: chronic lymphedema Rt UE ? ? ? ?Past Medical History:  ?Diagnosis Date  ? Allergy   ? Anemia   ? Arthritis   ? Asthma   ? Back pain, chronic   ? GETS INJECTIONS IN BACK  ? Blind right eye   ? hemorrhage  ? Blood transfusion   ? Cancer [facility])   ? breast 1994  ? Clotting disorder ([place])   ? Diabetes mellitus without complication ([place])   ? takes precose  ? Elevated liver enzymes   ? Gallstones   ? GERD (gastroesophageal reflux disease)   ? HX: breast cancer   ? Hyperlipidemia   ? Hypertension   ? Hypothyroid   ? Lymphedema of arm   ? RT  ? Morbid obesity ([place])   ? OSA (obstructive sleep apnea)   ? has not used in 2 years-lost weight  ? Osteoporosis   ? Peripheral neuropathy   ? on gabapentin  ? PONV (postoperative nausea and vomiting)   ? Psoriasis   ? Pulmonary embolism ([place]) 1998 / 1994 /1968  ? Syncope and collapse 2011  ? due to anemia  ? ?Past Surgical History:  ?Procedure Laterality Date  ? ABDOMINAL HYSTERECTOMY    ? BONE MARROW TRANSPLANT  1994  ? CARDIOVASCULAR STRESS TEST  05/23/2005  ? EF 53%  ? carpal tunnell    ? bil  ? cataracts    ? CHOLECYSTECTOMY   05/10/2012  ? Procedure: LAPAROSCOPIC CHOLECYSTECTOMY WITH INTRAOPERATIVE CHOLANGIOGRAM;  Surgeon: Pedro Earls, MD;  [facility];  Service: General;  Laterality: N/A;  Laparoscopic Cholecystectomy with Intraoperative Cholangiogram  ? GASTRIC BYPASS  2011  ? bariatric surgery  ? HIATAL HERNIA REPAIR    ? IVC filter    ? recurrent DVT  ? KNEE ARTHROTOMY  1998  ? MASS EXCISION Right 11/24/2016  ? Procedure: EXCISION RIGHT AXILLARY LESION AND CHEST WALL LESION;  Surgeon: Johnathan Hausen, MD;  Location: [facility];  Service: General;  Laterality: Right;  ? MASTECTOMY    ? Right  ? US ECHOCARDIOGRAPHY  10/27/08  ? EF 55-60%  ? ?Patient Active Problem List  ? Diagnosis Date Noted  ? Hypoglycemia following gastrointestinal surgery 03/19/2018  ? Edema of both lower extremities due to peripheral venous insufficiency 11/20/2017  ? Maxillary sinusitis, acute 08/31/2017  ? Encounter for therapeutic drug monitoring 08/26/2013  ? Insomnia 05/01/2013  ? Long term (current) use of anticoagulants 04/15/2013  ? Postsurgical dumping syndrome 04/06/2013  ? Bilateral leg edema 04/06/2013  ? Reactive hypoglycemia 04/02/2013  ?  Abdominal pain intermittent-evalulating for internal hernia 11/07/2012  ? S/P laparoscopic cholecystectomy 05/31/2012  ? Roux Y Gastric Bypass April 2011; repair [facility] 11/22/2011  ? Unspecified deficiency anemia 11/14/2011  ? Left leg paresthesias 10/27/2010  ? Hypothyroid 10/27/2010  ? Low back pain 10/27/2010  ? Obstructive sleep apnea 09/29/2007  ? PULMONARY EMBOLISM 09/25/2007  ? Seasonal and perennial allergic rhinitis 09/25/2007  ? Allergic asthma, mild persistent, uncomplicated 34/19/3790  ? BREAST CANCER, HX OF 09/25/2007  ? ? ?REFERRING DIAG: Rt UE lymphedema ? ?THERAPY DIAG:  ?Post-mastectomy lymphedema syndrome ? ?PERTINENT HISTORY: PERTINENT HISTORY: Pt had right breast mastectomy in 1994. She had 10/13 LN's that were malignant. She had chemo and radiation as well as a bone marrow  transplant in 1994. Went to a lymphedema clinic in [place], and had treatment here as well many years ago. Had a sleeve from 5 years ago but it is too tight. She takes Diurtetics and in the morning her legs are down a lot. Sometimes she feels swelling all over. Heart testing was OK ? ?PRECAUTIONS: Rt lympehdema ? ?SUBJECTIVE: ?Nothing new - bandages have been off since Friday.  ? ?PAIN:  ?Are you having pain? No, ? ? ? ?LYMPHEDEMA ASSESSMENTS:  ?  ?LANDMARK RIGHT  09/27/2021 Right 10/17/2021 Right 10/21/21 10/26/2021  ?10 cm proximal to olecranon process 40.8 39.7 37.5 38.9  ?Olecranon process 29.5 28.5 28.1 28.2  ?10 cm proximal to ulnar styloid process 31.9 30.0 28.4 28.5  ?Just proximal to ulnar styloid process 18.4 17.6 16.7 17.3  ?Across hand at thumb web space 19.5 19.6 18.6 19.5  ?At base of 2nd digit 6.0 5.7 5.6 5.5.7  ?(Blank rows = not tested) ?  ?  ?TODAY'S TREATMENT  ?11/01/27 ?in supine: Short neck, 5 diaphragmatic breaths, Lt axillary nodes and establishment of anterior inter-axillary pathway, R inguinal nodes and establishment of Rt axillo-inguinal pathway, then R UE working proximal to distal, moving inner upper arm outwards and upwards, and doing both sides of forearms, spending extra time in any areas of fibrosis then retracing all steps.   ?Compression bandaging to Rt UE, tg soft size medium, Elastomull fingers (fixated with paper tape) 1-4 , Rosidal soft from wrist to axilla, then 1 6cm hand and forearm , 8 cm (herring bone here) bandage right forearm,, 1 10cm, elbow to axilla, and 1 12 short stretch compression bandage applied from wrist to axilla. ?Had email from [place] arrive when pt was in treatment about setting up a pump trial.  Pt noted she had not been contacted yet.  Responded to email when pt will be here next and current phone number to make sure it was correct.  ? ? ?10/26/2021 ?In supine: Short neck, 5 diaphragmatic breaths, Lt axillary nodes and establishment of anterior  inter-axillary pathway, R inguinal nodes and establishment of Rt axillo-inguinal pathway, then R UE working proximal to distal, moving inner upper arm outwards and upwards, and doing both sides of forearms, spending extra time in any areas of fibrosis then retracing all steps. Slight Right lateral trunk edema noted today which is intermittent.  ?Compression bandaging to Rt UE, tg soft size medium, Elastomull fingers (fixated with paper tape) 1-4 , Rosidal soft from wrist to axilla, then 1 6cm hand and forearm , 8 cm (herring bone here) bandage right forearm,, 1 10cm, elbow to axilla, and 1 12 short stretch compression bandage applied from wrist to axilla. ?Pt wrapped because she is being measured this afternoon for garments and measurements with slight increases ? ? ?  10/24/2021 ?Pt felt like she did well with wrapping and MLD over the weekend ?In supine: Short neck, 5 diaphragmatic breaths, Lt axillary nodes and establishment of anterior inter-axillary pathway, R inguinal nodes and establishment of Rt axillo-inguinal pathway, then R UE working proximal to distal, moving inner upper arm outwards and upwards, and doing both sides of forearms, spending extra time in any areas of fibrosis then retracing all steps  Therapist did all MLD today as pt had a bad headache. ?Compression bandaging to rRt UE, tg soft size medium, Elastomull fingers (fixated with paper tape) 1-4 , Rosidal soft from wrist to axilla, then 1 6cm hand and forearm , 8 cm (herring bone here) bandage right forearm,, 1 10cm, elbow to axilla, and 1 12 short stretch compression bandage applied from wrist to axilla. ?Pt to be measured on Wednesday;trying to move AM appt to PM ? ?  ?PATIENT EDUCATION:  ?10/21/21: ?Education details: Self MLD ?Person educated: Patient ?Education method: Explanation, Demo, Handout ?Education comprehension: verbalized understanding and returned demo ?  ?  ?[place]: ?Self MLD, see below ?  ?ASSESSMENT: ?  ?CLINICAL  IMPRESSION: ? Continued Rt UE CDT until new sleeve arrives and possibly flexitouch ? ? ?OBJECTIVE IMPAIRMENTS decreased knowledge of condition, decreased ROM, increased edema, postural dysfunction, and pain.  ?   ?  ?GOALS:

## 2021-11-02 ENCOUNTER — Ambulatory Visit: Payer: Medicare Other

## 2021-11-02 DIAGNOSIS — I890 Lymphedema, not elsewhere classified: Secondary | ICD-10-CM | POA: Diagnosis not present

## 2021-11-02 DIAGNOSIS — I972 Postmastectomy lymphedema syndrome: Secondary | ICD-10-CM

## 2021-11-02 NOTE — Therapy (Signed)
?OUTPATIENT PHYSICAL THERAPY TREATMENT NOTE ? ? ?Patient Name: Kelli Martinez ?MRN: [id number] ?DOB:01/21/1946, 76 y.o., female ?Today's Date: 11/02/2021 ? ?PCP: Billie Ruddy, MD ?REFERRING PROVIDER: Billie Ruddy, MD ? ? PT End of Session - 11/02/21 0848   ? ? Visit Number 11   ? Number of Visits 18   ? Date for PT Re-Evaluation 11/08/21   ? PT Start Time [phone]   ? PT Stop Time 0949   ? PT Time Calculation (min) 57 min   ? Activity Tolerance Patient tolerated treatment well   ? Behavior During Therapy [facility] for tasks assessed/performed   ? ?  ?  ? ?  ? ? ? ? ?Past Medical History:  ?Diagnosis Date  ? Allergy   ? Anemia   ? Arthritis   ? Asthma   ? Back pain, chronic   ? GETS INJECTIONS IN BACK  ? Blind right eye   ? hemorrhage  ? Blood transfusion   ? Cancer [facility])   ? breast 1994  ? Clotting disorder ([place])   ? Diabetes mellitus without complication ([place])   ? takes precose  ? Elevated liver enzymes   ? Gallstones   ? GERD (gastroesophageal reflux disease)   ? HX: breast cancer   ? Hyperlipidemia   ? Hypertension   ? Hypothyroid   ? Lymphedema of arm   ? RT  ? Morbid obesity ([place])   ? OSA (obstructive sleep apnea)   ? has not used in 2 years-lost weight  ? Osteoporosis   ? Peripheral neuropathy   ? on gabapentin  ? PONV (postoperative nausea and vomiting)   ? Psoriasis   ? Pulmonary embolism ([place]) 1998 / 1994 /1968  ? Syncope and collapse 2011  ? due to anemia  ? ?Past Surgical History:  ?Procedure Laterality Date  ? ABDOMINAL HYSTERECTOMY    ? BONE MARROW TRANSPLANT  1994  ? CARDIOVASCULAR STRESS TEST  05/23/2005  ? EF 53%  ? carpal tunnell    ? bil  ? cataracts    ? CHOLECYSTECTOMY  05/10/2012  ? Procedure: LAPAROSCOPIC CHOLECYSTECTOMY WITH INTRAOPERATIVE CHOLANGIOGRAM;  Surgeon: Pedro Earls, MD;  Location: [facility];  Service: General;  Laterality: N/A;  Laparoscopic Cholecystectomy with Intraoperative Cholangiogram  ? GASTRIC BYPASS  2011  ? bariatric surgery  ? HIATAL HERNIA REPAIR    ? IVC filter     ? recurrent DVT  ? KNEE ARTHROTOMY  1998  ? MASS EXCISION Right 11/24/2016  ? Procedure: EXCISION RIGHT AXILLARY LESION AND CHEST WALL LESION;  Surgeon: Johnathan Hausen, MD;  Location: [place];  Service: General;  Laterality: Right;  ? MASTECTOMY    ? Right  ? US ECHOCARDIOGRAPHY  10/27/08  ? EF 55-60%  ? ?Patient Active Problem List  ? Diagnosis Date Noted  ? Hypoglycemia following gastrointestinal surgery 03/19/2018  ? Edema of both lower extremities due to peripheral venous insufficiency 11/20/2017  ? Maxillary sinusitis, acute 08/31/2017  ? Encounter for therapeutic drug monitoring 08/26/2013  ? Insomnia 05/01/2013  ? Long term (current) use of anticoagulants 04/15/2013  ? Postsurgical dumping syndrome 04/06/2013  ? Bilateral leg edema 04/06/2013  ? Reactive hypoglycemia 04/02/2013  ? Abdominal pain intermittent-evalulating for internal hernia 11/07/2012  ? S/P laparoscopic cholecystectomy 05/31/2012  ? Roux Y Gastric Bypass April 2011; repair [facility] 11/22/2011  ? Unspecified deficiency anemia 11/14/2011  ? Left leg paresthesias 10/27/2010  ? Hypothyroid 10/27/2010  ? Low back pain 10/27/2010  ? Obstructive  sleep apnea 09/29/2007  ? PULMONARY EMBOLISM 09/25/2007  ? Seasonal and perennial allergic rhinitis 09/25/2007  ? Allergic asthma, mild persistent, uncomplicated 60/63/0160  ? BREAST CANCER, HX OF 09/25/2007  ? ? ?REFERRING DIAG: Rt UE lymphedema ? ?THERAPY DIAG:  ?Post-mastectomy lymphedema syndrome ? ?Postmastectomy lymphedema ? ?Secondary lymphedema ? ?PERTINENT HISTORY: PERTINENT HISTORY: Pt had right breast mastectomy in 1994. She had 10/13 LN's that were malignant. She had chemo and radiation as well as a bone marrow transplant in 1994. Went to a lymphedema clinic in [place], and had treatment here as well many years ago. Had a sleeve from 5 years ago but it is too tight. She takes Diurtetics and in the morning her legs are down a lot. Sometimes she feels swelling all over. Heart testing  was OK ? ?PRECAUTIONS: Rt lympehdema ? ?SUBJECTIVE: ?I took bandages off last night to wash them and washed the sleeve too, so I havent had anything on since last night. I had the Flexi touch demo yesterday. It felt fine, but I had to run to the bathroom after 20 minutes. They think it will be covered by Medicare and T[employer]. ? ?PAIN:  ?Are you having pain? No, ? ? ? ?LYMPHEDEMA ASSESSMENTS:  ?  ?LANDMARK RIGHT  09/27/2021 Right 10/17/2021 Right 10/21/21 10/26/2021  ?10 cm proximal to olecranon process 40.8 39.7 37.5 38.9  ?Olecranon process 29.5 28.5 28.1 28.2  ?10 cm proximal to ulnar styloid process 31.9 30.0 28.4 28.5  ?Just proximal to ulnar styloid process 18.4 17.6 16.7 17.3  ?Across hand at thumb web space 19.5 19.6 18.6 19.5  ?At base of 2nd digit 6.0 5.7 5.6 5.5.7  ?(Blank rows = not tested) ?  ?  ?TODAY'S TREATMENT  ?11/02/2021 ? ?in supine: Short neck, 5 diaphragmatic breaths, Lt axillary nodes and establishment of anterior inter-axillary pathway, R inguinal nodes and establishment of Rt axillo-inguinal pathway, then R UE working proximal to distal, moving inner upper arm outwards and upwards, and doing both sides of forearms, spending extra time in any areas of fibrosis then retracing all steps.   ?Compression bandaging to Rt UE, tg soft size medium, Elastomull fingers (fixated with paper tape) 1-4 ,  Gray foam at dorsum of hand,Rosidal soft from wrist to axilla, then 1 6cm hand and forearm , 10 cm (herring bone here) bandage right forearm,, 1 10cm, elbow to axilla, and 1 12 short stretch compression bandage applied from wrist to axilla. ? ? ?11/01/27 ?in supine: Short neck, 5 diaphragmatic breaths, Lt axillary nodes and establishment of anterior inter-axillary pathway, R inguinal nodes and establishment of Rt axillo-inguinal pathway, then R UE working proximal to distal, moving inner upper arm outwards and upwards, and doing both sides of forearms, spending extra time in any areas of fibrosis then retracing  all steps.   ?Compression bandaging to Rt UE, tg soft size medium, Elastomull fingers (fixated with paper tape) 1-4 , Rosidal soft from wrist to axilla, then 1 6cm hand and forearm , 8 cm (herring bone here) bandage right forearm,, 1 10cm, elbow to axilla, and 1 12 short stretch compression bandage applied from wrist to axilla. ?Had email from [place] arrive when pt was in treatment about setting up a pump trial.  Pt noted she had not been contacted yet.  Responded to email when pt will be here next and current phone number to make sure it was correct.  ? ? ?10/26/2021 ?In supine: Short neck, 5 diaphragmatic breaths, Lt axillary nodes and establishment of anterior  inter-axillary pathway, R inguinal nodes and establishment of Rt axillo-inguinal pathway, then R UE working proximal to distal, moving inner upper arm outwards and upwards, and doing both sides of forearms, spending extra time in any areas of fibrosis then retracing all steps. Slight Right lateral trunk edema noted today which is intermittent.  ?Compression bandaging to Rt UE, tg soft size medium, Elastomull fingers (fixated with paper tape) 1-4 , Rosidal soft from wrist to axilla, then 1 6cm hand and forearm , 8 cm (herring bone here) bandage right forearm,, 1 10cm, elbow to axilla, and 1 12 short stretch compression bandage applied from wrist to axilla. ?Pt wrapped because she is being measured this afternoon for garments and measurements with slight increases ? ? ?10/24/2021 ?Pt felt like she did well with wrapping and MLD over the weekend ?In supine: Short neck, 5 diaphragmatic breaths, Lt axillary nodes and establishment of anterior inter-axillary pathway, R inguinal nodes and establishment of Rt axillo-inguinal pathway, then R UE working proximal to distal, moving inner upper arm outwards and upwards, and doing both sides of forearms, spending extra time in any areas of fibrosis then retracing all steps  Therapist did all MLD today as pt had a bad  headache. ?Compression bandaging to rRt UE, tg soft size medium, Elastomull fingers (fixated with paper tape) 1-4 , Rosidal soft from wrist to axilla, then 1 6cm hand and forearm , 8 cm (herring bone he

## 2021-11-04 ENCOUNTER — Encounter: Payer: Self-pay | Admitting: Rehabilitation

## 2021-11-04 ENCOUNTER — Ambulatory Visit: Payer: Medicare Other | Admitting: Rehabilitation

## 2021-11-04 DIAGNOSIS — I972 Postmastectomy lymphedema syndrome: Secondary | ICD-10-CM | POA: Diagnosis not present

## 2021-11-04 DIAGNOSIS — I890 Lymphedema, not elsewhere classified: Secondary | ICD-10-CM | POA: Diagnosis not present

## 2021-11-04 NOTE — Therapy (Signed)
?OUTPATIENT PHYSICAL THERAPY TREATMENT NOTE ? ? ?Patient Name: Kelli Martinez ?MRN: [id number] ?DOB:1946-01-28, 76 y.o., female ?Today's Date: 11/04/2021 ? ?PCP: Billie Ruddy, MD ?REFERRING PROVIDER: Billie Ruddy, MD ? ? PT End of Session - 11/04/21 0859   ? ? Visit Number 12   ? Number of Visits 18   ? Date for PT Re-Evaluation 11/08/21   ? PT Start Time 0900   ? PT Stop Time [phone]   ? PT Time Calculation (min) 58 min   ? Activity Tolerance Patient tolerated treatment well   ? Behavior During Therapy [facility] for tasks assessed/performed   ? ?  ?  ? ?  ? ? ? ? ?Past Medical History:  ?Diagnosis Date  ? Allergy   ? Anemia   ? Arthritis   ? Asthma   ? Back pain, chronic   ? GETS INJECTIONS IN BACK  ? Blind right eye   ? hemorrhage  ? Blood transfusion   ? Cancer [facility])   ? breast 1994  ? Clotting disorder ([place])   ? Diabetes mellitus without complication ([place])   ? takes precose  ? Elevated liver enzymes   ? Gallstones   ? GERD (gastroesophageal reflux disease)   ? HX: breast cancer   ? Hyperlipidemia   ? Hypertension   ? Hypothyroid   ? Lymphedema of arm   ? RT  ? Morbid obesity ([place])   ? OSA (obstructive sleep apnea)   ? has not used in 2 years-lost weight  ? Osteoporosis   ? Peripheral neuropathy   ? on gabapentin  ? PONV (postoperative nausea and vomiting)   ? Psoriasis   ? Pulmonary embolism ([place]) 1998 / 1994 /1968  ? Syncope and collapse 2011  ? due to anemia  ? ?Past Surgical History:  ?Procedure Laterality Date  ? ABDOMINAL HYSTERECTOMY    ? BONE MARROW TRANSPLANT  1994  ? CARDIOVASCULAR STRESS TEST  05/23/2005  ? EF 53%  ? carpal tunnell    ? bil  ? cataracts    ? CHOLECYSTECTOMY  05/10/2012  ? Procedure: LAPAROSCOPIC CHOLECYSTECTOMY WITH INTRAOPERATIVE CHOLANGIOGRAM;  Surgeon: Pedro Earls, MD;  [facility];  Service: General;  Laterality: N/A;  Laparoscopic Cholecystectomy with Intraoperative Cholangiogram  ? GASTRIC BYPASS  2011  ? bariatric surgery  ? HIATAL HERNIA REPAIR    ? IVC filter     ? recurrent DVT  ? KNEE ARTHROTOMY  1998  ? MASS EXCISION Right 11/24/2016  ? Procedure: EXCISION RIGHT AXILLARY LESION AND CHEST WALL LESION;  Surgeon: Johnathan Hausen, MD;  Location: [place];  Service: General;  Laterality: Right;  ? MASTECTOMY    ? Right  ? US ECHOCARDIOGRAPHY  10/27/08  ? EF 55-60%  ? ?Patient Active Problem List  ? Diagnosis Date Noted  ? Hypoglycemia following gastrointestinal surgery 03/19/2018  ? Edema of both lower extremities due to peripheral venous insufficiency 11/20/2017  ? Maxillary sinusitis, acute 08/31/2017  ? Encounter for therapeutic drug monitoring 08/26/2013  ? Insomnia 05/01/2013  ? Long term (current) use of anticoagulants 04/15/2013  ? Postsurgical dumping syndrome 04/06/2013  ? Bilateral leg edema 04/06/2013  ? Reactive hypoglycemia 04/02/2013  ? Abdominal pain intermittent-evalulating for internal hernia 11/07/2012  ? S/P laparoscopic cholecystectomy 05/31/2012  ? Roux Y Gastric Bypass April 2011; repair [facility] 11/22/2011  ? Unspecified deficiency anemia 11/14/2011  ? Left leg paresthesias 10/27/2010  ? Hypothyroid 10/27/2010  ? Low back pain 10/27/2010  ? Obstructive  sleep apnea 09/29/2007  ? PULMONARY EMBOLISM 09/25/2007  ? Seasonal and perennial allergic rhinitis 09/25/2007  ? Allergic asthma, mild persistent, uncomplicated 82/99/3716  ? BREAST CANCER, HX OF 09/25/2007  ? ? ?REFERRING DIAG: Rt UE lymphedema ? ?THERAPY DIAG:  ?Post-mastectomy lymphedema syndrome ? ?PERTINENT HISTORY: PERTINENT HISTORY: Pt had right breast mastectomy in 1994. She had 10/13 LN's that were malignant. She had chemo and radiation as well as a bone marrow transplant in 1994. Went to a lymphedema clinic in [place], and had treatment here as well many years ago. Had a sleeve from 5 years ago but it is too tight. She takes Diurtetics and in the morning her legs are down a lot. Sometimes she feels swelling all over. Heart testing was OK ? ?PRECAUTIONS: Rt  lympehdema ? ?SUBJECTIVE: ?I have had them off since the middle of the night ? ?PAIN:  ?Are you having pain? No, ? ? ? ?LYMPHEDEMA ASSESSMENTS:  ?  ?LANDMARK RIGHT  09/27/2021 Right 10/17/2021 Right 10/21/21 10/26/2021 11/04/21  ?10 cm proximal to olecranon process 40.8 39.7 37.5 38.9 38  ?Olecranon process 29.5 28.5 28.1 28.2 29  ?10 cm proximal to ulnar styloid process 31.9 30.0 28.4 28.5 27  ?Just proximal to ulnar styloid process 18.4 17.6 16.7 17.3 16.3  ?Across hand at thumb web space 19.5 19.6 18.6 19.5 19  ?At base of 2nd digit 6.0 5.7 5.6 5.5.7 5.8  ?(Blank rows = not tested) ?  ?  ?TODAY'S TREATMENT  ?11/04/21 ?in supine: Short neck, 5 diaphragmatic breaths, bil axillary nodes and establishment of anterior inter-axillary pathway, R inguinal nodes and establishment of Rt axillo-inguinal pathway, then R UE working proximal to distal, moving inner upper arm outwards and upwards, and doing both sides of forearms, spending extra time in any areas of fibrosis then retracing all steps.   ?Compression bandaging to Rt UE, tg soft size medium, Elastomull fingers (fixated with paper tape) 1-4 ,  Rosidal soft from wrist to axilla, then 1 6cm hand and forearm , 8 cm (herring bone here) bandage right forearm,, 1 10cm, elbow to axilla, and 1 12 short stretch compression bandage applied from wrist to axilla. ? ?11/02/2021 ?in supine: Short neck, 5 diaphragmatic breaths, Lt axillary nodes and establishment of anterior inter-axillary pathway, R inguinal nodes and establishment of Rt axillo-inguinal pathway, then R UE working proximal to distal, moving inner upper arm outwards and upwards, and doing both sides of forearms, spending extra time in any areas of fibrosis then retracing all steps.   ?Compression bandaging to Rt UE, tg soft size medium, Elastomull fingers (fixated with paper tape) 1-4 ,  Gray foam at dorsum of hand,Rosidal soft from wrist to axilla, then 1 6cm hand and forearm , 10 cm (herring bone here) bandage right  forearm,, 1 10cm, elbow to axilla, and 1 12 short stretch compression bandage applied from wrist to axilla. ? ? ?10/31/21 ?in supine: Short neck, 5 diaphragmatic breaths, Lt axillary nodes and establishment of anterior inter-axillary pathway, R inguinal nodes and establishment of Rt axillo-inguinal pathway, then R UE working proximal to distal, moving inner upper arm outwards and upwards, and doing both sides of forearms, spending extra time in any areas of fibrosis then retracing all steps.   ?Compression bandaging to Rt UE, tg soft size medium, Elastomull fingers (fixated with paper tape) 1-4 , Rosidal soft from wrist to axilla, then 1 6cm hand and forearm , 8 cm (herring bone here) bandage right forearm,, 1 10cm, elbow to axilla, and  1 12 short stretch compression bandage applied from wrist to axilla. ?Had email from [place] arrive when pt was in treatment about setting up a pump trial.  Pt noted she had not been contacted yet.  Responded to email when pt will be here next and current phone number to make sure it was correct.  ? ? ?PATIENT EDUCATION:  ?10/21/21: ?Education details: Self MLD ?Person educated: Patient ?Education method: Explanation, Demo, Handout ?Education comprehension: verbalized understanding and returned demo ?  ?  ?[place]: ?Self MLD, see below ?  ?ASSESSMENT: ?  ?CLINICAL IMPRESSION: ? Continued Rt UE CDT until new sleeve arrives and possibly flexitouch. Right upper arm felt slightly fuller today with MLD. Pt thinks they will be sending Flexitouch out soon. Advised her that MD must approve use of device if she wants to use it on her legs. ? ? ?OBJECTIVE IMPAIRMENTS decreased knowledge of condition, decreased ROM, increased edema, postural dysfunction, and pain.  ?   ?  ?GOALS: ?LONG TERM GOALS:  ?  ?LTG Name Target Date Goal status  ?1 Pt will be independent with compression bandaging to reduce UE lymphedema ?Baseline: 11/08/2021 INITIAL  ?2 Pt will have reduction at 10 cm prox  to ulna by 4-6 cm to reduce heaviness of UE ?Baseline: 11/08/2021 INITIAL  ?3 Pt will be independent in self right UE MLD to reduce swelling ?Baseline: 11/08/2021 INITIAL  ?4 Pt will be fit for compression garments and will be able t

## 2021-11-07 ENCOUNTER — Other Ambulatory Visit: Payer: Self-pay | Admitting: Endocrinology

## 2021-11-07 ENCOUNTER — Ambulatory Visit: Payer: Medicare Other

## 2021-11-07 DIAGNOSIS — E063 Autoimmune thyroiditis: Secondary | ICD-10-CM

## 2021-11-07 DIAGNOSIS — I890 Lymphedema, not elsewhere classified: Secondary | ICD-10-CM | POA: Diagnosis not present

## 2021-11-07 DIAGNOSIS — I972 Postmastectomy lymphedema syndrome: Secondary | ICD-10-CM | POA: Diagnosis not present

## 2021-11-07 NOTE — Therapy (Signed)
?OUTPATIENT PHYSICAL THERAPY TREATMENT NOTE ? ? ?Patient Name: Kelli Martinez ?MRN: [id number] ?DOB:April 21, 1946, 76 y.o., female ?Today's Date: 11/07/2021 ? ?PCP: Billie Ruddy, MD ?REFERRING PROVIDER: Billie Ruddy, MD ? ? PT End of Session - 11/07/21 1111   ? ? Visit Number 13   ? Number of Visits 18   ? Date for PT Re-Evaluation 11/08/21   ? PT Start Time 1105   ? PT Stop Time 1200   ? PT Time Calculation (min) 55 min   ? Activity Tolerance Patient tolerated treatment well   ? Behavior During Therapy [facility] for tasks assessed/performed   ? ?  ?  ? ?  ? ? ? ? ?Past Medical History:  ?Diagnosis Date  ? Allergy   ? Anemia   ? Arthritis   ? Asthma   ? Back pain, chronic   ? GETS INJECTIONS IN BACK  ? Blind right eye   ? hemorrhage  ? Blood transfusion   ? Cancer [facility])   ? breast 1994  ? Clotting disorder ([place])   ? Diabetes mellitus without complication ([place])   ? takes precose  ? Elevated liver enzymes   ? Gallstones   ? GERD (gastroesophageal reflux disease)   ? HX: breast cancer   ? Hyperlipidemia   ? Hypertension   ? Hypothyroid   ? Lymphedema of arm   ? RT  ? Morbid obesity ([place])   ? OSA (obstructive sleep apnea)   ? has not used in 2 years-lost weight  ? Osteoporosis   ? Peripheral neuropathy   ? on gabapentin  ? PONV (postoperative nausea and vomiting)   ? Psoriasis   ? Pulmonary embolism ([place]) 1998 / 1994 /1968  ? Syncope and collapse 2011  ? due to anemia  ? ?Past Surgical History:  ?Procedure Laterality Date  ? ABDOMINAL HYSTERECTOMY    ? BONE MARROW TRANSPLANT  1994  ? CARDIOVASCULAR STRESS TEST  05/23/2005  ? EF 53%  ? carpal tunnell    ? bil  ? cataracts    ? CHOLECYSTECTOMY  05/10/2012  ? Procedure: LAPAROSCOPIC CHOLECYSTECTOMY WITH INTRAOPERATIVE CHOLANGIOGRAM;  Surgeon: Pedro Earls, MD;  Location: [facility];  Service: General;  Laterality: N/A;  Laparoscopic Cholecystectomy with Intraoperative Cholangiogram  ? GASTRIC BYPASS  2011  ? bariatric surgery  ? HIATAL HERNIA REPAIR    ? IVC filter     ? recurrent DVT  ? KNEE ARTHROTOMY  1998  ? MASS EXCISION Right 11/24/2016  ? Procedure: EXCISION RIGHT AXILLARY LESION AND CHEST WALL LESION;  Surgeon: Johnathan Hausen, MD;  Location: [facility];  Service: General;  Laterality: Right;  ? MASTECTOMY    ? Right  ? US ECHOCARDIOGRAPHY  10/27/08  ? EF 55-60%  ? ?Patient Active Problem List  ? Diagnosis Date Noted  ? Hypoglycemia following gastrointestinal surgery 03/19/2018  ? Edema of both lower extremities due to peripheral venous insufficiency 11/20/2017  ? Maxillary sinusitis, acute 08/31/2017  ? Encounter for therapeutic drug monitoring 08/26/2013  ? Insomnia 05/01/2013  ? Long term (current) use of anticoagulants 04/15/2013  ? Postsurgical dumping syndrome 04/06/2013  ? Bilateral leg edema 04/06/2013  ? Reactive hypoglycemia 04/02/2013  ? Abdominal pain intermittent-evalulating for internal hernia 11/07/2012  ? S/P laparoscopic cholecystectomy 05/31/2012  ? Roux Y Gastric Bypass April 2011; repair [facility] 11/22/2011  ? Unspecified deficiency anemia 11/14/2011  ? Left leg paresthesias 10/27/2010  ? Hypothyroid 10/27/2010  ? Low back pain 10/27/2010  ? Obstructive  sleep apnea 09/29/2007  ? PULMONARY EMBOLISM 09/25/2007  ? Seasonal and perennial allergic rhinitis 09/25/2007  ? Allergic asthma, mild persistent, uncomplicated 91/63/8466  ? BREAST CANCER, HX OF 09/25/2007  ? ? ?REFERRING DIAG: Rt UE lymphedema ? ?THERAPY DIAG:  ?Post-mastectomy lymphedema syndrome ? ?PERTINENT HISTORY: PERTINENT HISTORY: Pt had right breast mastectomy in 1994. She had 10/13 LN's that were malignant. She had chemo and radiation as well as a bone marrow transplant in 1994. Went to a lymphedema clinic in [place], and had treatment here as well many years ago. Had a sleeve from 5 years ago but it is too tight. She takes Diurtetics and in the morning her legs are down a lot. Sometimes she feels swelling all over. Heart testing was OK ? ?PRECAUTIONS: Rt lympehdema ? ?SUBJECTIVE:  I forgot my bandages! ? ?PAIN:  ?Are you having pain? No, ? ? ? ?LYMPHEDEMA ASSESSMENTS:  ?  ?LANDMARK RIGHT  09/27/2021 Right 10/17/2021 Right 10/21/21 10/26/2021 11/04/21  ?10 cm proximal to olecranon process 40.8 39.7 37.5 38.9 38  ?Olecranon process 29.5 28.5 28.1 28.2 29  ?10 cm proximal to ulnar styloid process 31.9 30.0 28.4 28.5 27  ?Just proximal to ulnar styloid process 18.4 17.6 16.7 17.3 16.3  ?Across hand at thumb web space 19.5 19.6 18.6 19.5 19  ?At base of 2nd digit 6.0 5.7 5.6 5.5.7 5.8  ?(Blank rows = not tested) ?  ?  ?TODAY'S TREATMENT  ?11/07/21: ?In supine: Short neck, 5 diaphragmatic breaths, Lt axillary nodes and establishment of anterior inter-axillary pathway, Rt inguinal nodes and establishment of Rt axillo-inguinal pathway, then Rt UE working proximal to distal, moving inner upper arm outwards and upwards, and doing both sides of forearms, spending extra time in any areas of fibrosis then retracing all steps.  ?Did not bandage today as pt forgot her bandages and she has already been given a second set.  ?11/04/21 ?in supine: Short neck, 5 diaphragmatic breaths, bil axillary nodes and establishment of anterior inter-axillary pathway, R inguinal nodes and establishment of Rt axillo-inguinal pathway, then R UE working proximal to distal, moving inner upper arm outwards and upwards, and doing both sides of forearms, spending extra time in any areas of fibrosis then retracing all steps.   ?Compression bandaging to Rt UE, tg soft size medium, Elastomull fingers (fixated with paper tape) 1-4 ,  Rosidal soft from wrist to axilla, then 1 6cm hand and forearm , 8 cm (herring bone here) bandage right forearm,, 1 10cm, elbow to axilla, and 1 12 short stretch compression bandage applied from wrist to axilla. ? ?11/02/2021 ?in supine: Short neck, 5 diaphragmatic breaths, Lt axillary nodes and establishment of anterior inter-axillary pathway, R inguinal nodes and establishment of Rt axillo-inguinal pathway, then R  UE working proximal to distal, moving inner upper arm outwards and upwards, and doing both sides of forearms, spending extra time in any areas of fibrosis then retracing all steps.   ?Compression bandaging to Rt UE, tg soft size medium, Elastomull fingers (fixated with paper tape) 1-4 ,  Gray foam at dorsum of hand,Rosidal soft from wrist to axilla, then 1 6cm hand and forearm , 10 cm (herring bone here) bandage right forearm,, 1 10cm, elbow to axilla, and 1 12 short stretch compression bandage applied from wrist to axilla. ? ? ?PATIENT EDUCATION:  ?10/21/21: ?Education details: Self MLD ?Person educated: Patient ?Education method: Explanation, Demo, Handout ?Education comprehension: verbalized understanding and returned demo ?  ?  ?[place]: ?Self MLD, see  below ?  ?ASSESSMENT: ?  ?CLINICAL IMPRESSION: ? Continued Rt UE MLD but was not able to bandage today as pt forgot her bandages and she has already been issued second set. She has had her Flexitouch demo already and they are just finalizing her coverage through her insurances. Encouraged pt to place second set of bandages in her car so she won't forget them again. She verbalized understanding.  ? ? ?OBJECTIVE IMPAIRMENTS decreased knowledge of condition, decreased ROM, increased edema, postural dysfunction, and pain.  ?   ?  ?GOALS: ?LONG TERM GOALS:  ?  ?LTG Name Target Date Goal status  ?1 Pt will be independent with compression bandaging to reduce UE lymphedema ?Baseline: 11/08/2021 INITIAL  ?2 Pt will have reduction at 10 cm prox to ulna by 4-6 cm to reduce heaviness of UE ?Baseline: 11/08/2021 INITIAL  ?3 Pt will be independent in self right UE MLD to reduce swelling ?Baseline: 11/08/2021 INITIAL  ?4 Pt will be fit for compression garments and will be able to don independently ?Baseline: 11/08/2021 Fit but not yet received ?11/26/2021  ?5        ?6        ?7        ?  ?PLAN: ?PT FREQUENCY: 3x/week ?  ?PT DURATION: 6 weeks ?  ?PLANNED INTERVENTIONS:  Therapeutic exercises, Patient/Family education, Orthotic/Fit training, Manual lymph drainage, Compression bandaging, Manual therapy, and Neuro Muscular re-education. Send demo for flexitouch ?  ?PLA

## 2021-11-09 ENCOUNTER — Ambulatory Visit: Payer: Medicare Other

## 2021-11-09 DIAGNOSIS — I890 Lymphedema, not elsewhere classified: Secondary | ICD-10-CM | POA: Diagnosis not present

## 2021-11-09 DIAGNOSIS — I972 Postmastectomy lymphedema syndrome: Secondary | ICD-10-CM

## 2021-11-09 NOTE — Addendum Note (Signed)
Addended by: Manus Gunning L on: 11/09/2021 04:50 PM ? ? Modules accepted: Orders ? ?

## 2021-11-09 NOTE — Therapy (Signed)
?OUTPATIENT PHYSICAL THERAPY TREATMENT NOTE ? ? ?Patient Name: Kelli Martinez ?MRN: [id number] ?DOB:13-Dec-1945, 76 y.o., female ?Today's Date: 11/09/2021 ? ?PCP: Billie Ruddy, MD ?REFERRING PROVIDER: Billie Ruddy, MD ? ? PT End of Session - 11/09/21 0907   ? ? Visit Number 14   ? Number of Visits 22   ? Date for PT Re-Evaluation 12/07/21   ? PT Start Time [phone]   ? PT Stop Time 1002   ? PT Time Calculation (min) 57 min   ? Activity Tolerance Patient tolerated treatment well   ? Behavior During Therapy [facility] for tasks assessed/performed   ? ?  ?  ? ?  ? ? ? ? ?Past Medical History:  ?Diagnosis Date  ? Allergy   ? Anemia   ? Arthritis   ? Asthma   ? Back pain, chronic   ? GETS INJECTIONS IN BACK  ? Blind right eye   ? hemorrhage  ? Blood transfusion   ? Cancer [facility])   ? breast 1994  ? Clotting disorder ([place])   ? Diabetes mellitus without complication ([place])   ? takes precose  ? Elevated liver enzymes   ? Gallstones   ? GERD (gastroesophageal reflux disease)   ? HX: breast cancer   ? Hyperlipidemia   ? Hypertension   ? Hypothyroid   ? Lymphedema of arm   ? RT  ? Morbid obesity ([place])   ? OSA (obstructive sleep apnea)   ? has not used in 2 years-lost weight  ? Osteoporosis   ? Peripheral neuropathy   ? on gabapentin  ? PONV (postoperative nausea and vomiting)   ? Psoriasis   ? Pulmonary embolism ([place]) 1998 / 1994 /1968  ? Syncope and collapse 2011  ? due to anemia  ? ?Past Surgical History:  ?Procedure Laterality Date  ? ABDOMINAL HYSTERECTOMY    ? BONE MARROW TRANSPLANT  1994  ? CARDIOVASCULAR STRESS TEST  05/23/2005  ? EF 53%  ? carpal tunnell    ? bil  ? cataracts    ? CHOLECYSTECTOMY  05/10/2012  ? Procedure: LAPAROSCOPIC CHOLECYSTECTOMY WITH INTRAOPERATIVE CHOLANGIOGRAM;  Surgeon: Pedro Earls, MD;  [facility];  Service: General;  Laterality: N/A;  Laparoscopic Cholecystectomy with Intraoperative Cholangiogram  ? GASTRIC BYPASS  2011  ? bariatric surgery  ? HIATAL HERNIA REPAIR    ? IVC filter     ? recurrent DVT  ? KNEE ARTHROTOMY  1998  ? MASS EXCISION Right 11/24/2016  ? Procedure: EXCISION RIGHT AXILLARY LESION AND CHEST WALL LESION;  Surgeon: Johnathan Hausen, MD;  Location: [facility];  Service: General;  Laterality: Right;  ? MASTECTOMY    ? Right  ? US ECHOCARDIOGRAPHY  10/27/08  ? EF 55-60%  ? ?Patient Active Problem List  ? Diagnosis Date Noted  ? Hypoglycemia following gastrointestinal surgery 03/19/2018  ? Edema of both lower extremities due to peripheral venous insufficiency 11/20/2017  ? Maxillary sinusitis, acute 08/31/2017  ? Encounter for therapeutic drug monitoring 08/26/2013  ? Insomnia 05/01/2013  ? Long term (current) use of anticoagulants 04/15/2013  ? Postsurgical dumping syndrome 04/06/2013  ? Bilateral leg edema 04/06/2013  ? Reactive hypoglycemia 04/02/2013  ? Abdominal pain intermittent-evalulating for internal hernia 11/07/2012  ? S/P laparoscopic cholecystectomy 05/31/2012  ? Roux Y Gastric Bypass April 2011; repair [facility] 11/22/2011  ? Unspecified deficiency anemia 11/14/2011  ? Left leg paresthesias 10/27/2010  ? Hypothyroid 10/27/2010  ? Low back pain 10/27/2010  ? Obstructive  sleep apnea 09/29/2007  ? PULMONARY EMBOLISM 09/25/2007  ? Seasonal and perennial allergic rhinitis 09/25/2007  ? Allergic asthma, mild persistent, uncomplicated 34/19/3790  ? BREAST CANCER, HX OF 09/25/2007  ? ? ?REFERRING DIAG: Rt UE lymphedema ? ?THERAPY DIAG:  ?Post-mastectomy lymphedema syndrome ? ?Postmastectomy lymphedema ? ?Secondary lymphedema ? ?PERTINENT HISTORY: PERTINENT HISTORY: Pt had right breast mastectomy in 1994. She had 10/13 LN's that were malignant. She had chemo and radiation as well as a bone marrow transplant in 1994. Went to a lymphedema clinic in [place], and had treatment here as well many years ago. Had a sleeve from 5 years ago but it is too tight. She takes Diurtetics and in the morning her legs are down a lot. Sometimes she feels swelling all over. Heart testing  was OK ? ?PRECAUTIONS: Rt lympehdema ? ?SUBJECTIVE: I brought my bandages. I wrapped myself Monday night and wore my sleeve yesterday. I'm also doing my self MLD daily.  ? ?PAIN:  ?Are you having pain? No, ? ? ? ?LYMPHEDEMA ASSESSMENTS:  ?  ?[facility] RIGHT  09/27/2021 Right 10/17/2021 Right 10/21/21 10/26/2021 11/04/21 11/09/21  ?10 cm proximal to olecranon process 40.8 39.7 37.5 38.9 38 36.8  ?Olecranon process 29.5 28.5 28.1 28.2 29 26.7  ?10 cm proximal to ulnar styloid process 31.9 30.0 28.4 28.5 27 27.9  ?Just proximal to ulnar styloid process 18.4 17.6 16.7 17.3 16.3 16.4  ?Across hand at thumb web space 19.5 19.6 18.6 19.5 19 18.6  ?At base of 2nd digit 6.0 5.7 5.6 5.7 5.8 5.7  ?(Blank rows = not tested) ?  ?  ?TODAY'S TREATMENT  ?11/09/21: ?in supine: Short neck, 5 diaphragmatic breaths, bil axillary nodes and establishment of anterior inter-axillary pathway, R inguinal nodes and establishment of Rt axillo-inguinal pathway, then R UE working proximal to distal, moving inner upper arm outwards and upwards, and doing both sides of forearms, spending extra time in any areas of fibrosis then retracing all steps.   ?Compression bandaging to Rt UE, tg soft size medium, Elastomull fingers (fixated with paper tape) 1-4 ,  Rosidal soft from wrist to axilla, then 1 6cm hand and forearm , 8 cm  bandage right forearm,, 1 10cm (herring bone here), elbow to axilla, and 1 12 short stretch compression bandage applied from wrist to axilla. ? ?11/07/21: ?In supine: Short neck, 5 diaphragmatic breaths, Lt axillary nodes and establishment of anterior inter-axillary pathway, Rt inguinal nodes and establishment of Rt axillo-inguinal pathway, then Rt UE working proximal to distal, moving inner upper arm outwards and upwards, and doing both sides of forearms, spending extra time in any areas of fibrosis then retracing all steps.  ?Did not bandage today as pt forgot her bandages and she has already been given a second set.  ?11/04/21: ?in  supine: Short neck, 5 diaphragmatic breaths, bil axillary nodes and establishment of anterior inter-axillary pathway, R inguinal nodes and establishment of Rt axillo-inguinal pathway, then R UE working proximal to distal, moving inner upper arm outwards and upwards, and doing both sides of forearms, spending extra time in any areas of fibrosis then retracing all steps.   ?Compression bandaging to Rt UE, tg soft size medium, Elastomull fingers (fixated with paper tape) 1-4 ,  Rosidal soft from wrist to axilla, then 1 6cm hand and forearm , 8 cm (herring bone here) bandage right forearm,, 1 10cm, elbow to axilla, and 1 12 short stretch compression bandage applied from wrist to axilla. ? ? ?PATIENT EDUCATION:  ?10/21/21: ?Education details:  Self MLD ?Person educated: Patient ?Education method: Explanation, Demo, Handout ?Education comprehension: verbalized understanding and returned demo ?  ?  ?[place]: ?Self MLD, see below ?  ?ASSESSMENT: ?  ?CLINICAL IMPRESSION: ?Renewal done today. Pt has made excellent progress thus far towards her goals and circumference reduction. We are now just awaiting arrival of her new compression garments. She would like to be placed on hold until garments arrive as she is confident at this time with the self MLD and compression bandaging but also knows she can return if needed before garments arrive.  ? ? ?OBJECTIVE IMPAIRMENTS decreased knowledge of condition, decreased ROM, increased edema, postural dysfunction, and pain.  ?   ?  ?GOALS: ?LONG TERM GOALS:  ?  ?LTG Name Target Date Goal status  ?1 Pt will be independent with compression bandaging to reduce UE lymphedema ?Baseline: Pt performs this prn at home - 11/09/21 11/08/2021 Goal met  ?2 Pt will have reduction at 10 cm prox to ulna by 4-6 cm to reduce heaviness of UE ?Baseline: 27.9 cm (4 cm reduction) - 11/09/21 11/08/2021 Goal met  ?3 Pt will be independent in self right UE MLD to reduce swelling ?Baseline: Pt performs  this daily - 11/09/21 11/08/2021 Goal met  ?4 Pt will be fit for compression garments and will be able to don independently ?Baseline: Has been fit but not yet received - 10/26/21 11/08/2021 Partially met  ?5

## 2021-11-11 ENCOUNTER — Ambulatory Visit (INDEPENDENT_AMBULATORY_CARE_PROVIDER_SITE_OTHER): Payer: Medicare Other

## 2021-11-11 ENCOUNTER — Encounter: Payer: Medicare Other | Admitting: Physical Therapy

## 2021-11-11 DIAGNOSIS — Z7901 Long term (current) use of anticoagulants: Secondary | ICD-10-CM | POA: Diagnosis not present

## 2021-11-11 LAB — POCT INR: INR: 2.4 (ref 2.0–3.0)

## 2021-11-11 NOTE — Patient Instructions (Addendum)
Pre visit review using our clinic review tool, if applicable. No additional management support is needed unless otherwise documented below in the visit note.  Continue to take 2 tablets daily except 1 tablet on Wed and Saturdays.  Re-check in 4 weeks.  

## 2021-11-11 NOTE — Progress Notes (Signed)
Patient ID: Kelli Martinez, female   DOB: 08-06-1945, 76 y.o.   MRN: [id number] ? ?Medical screening examination/treatment/procedure(s) were performed by non-physician practitioner and as supervising physician I was immediately available for consultation/collaboration.  I agree with above. Cathlean Cower, MD ? ?

## 2021-11-11 NOTE — Progress Notes (Signed)
Continue to take 2 tablets daily except 1 tablet on Wed and Saturdays.  Re-check in 4 weeks.  

## 2021-11-15 ENCOUNTER — Telehealth: Payer: Self-pay | Admitting: Family Medicine

## 2021-11-15 NOTE — Telephone Encounter (Signed)
Pt called and stated that in order for [facility] to provide her with some leg compression garments they would need a Rx from Dr. Volanda Napoleon. They can fill compression levels between 15-20 or 20-30 but anything above 30 needs a Rx.  ? ?Please advise.  ?

## 2021-11-17 ENCOUNTER — Ambulatory Visit: Payer: Medicare Other

## 2021-11-17 DIAGNOSIS — I890 Lymphedema, not elsewhere classified: Secondary | ICD-10-CM | POA: Diagnosis not present

## 2021-11-17 DIAGNOSIS — I972 Postmastectomy lymphedema syndrome: Secondary | ICD-10-CM

## 2021-11-17 NOTE — Therapy (Addendum)
?OUTPATIENT PHYSICAL THERAPY TREATMENT NOTE ? ? ?Patient Name: Kelli Martinez ?MRN: [id number] ?DOB:November 18, 1945, 76 y.o., female ?Today's Date: 11/17/2021 ? ?PCP: Billie Ruddy, MD ?REFERRING PROVIDER: Billie Ruddy, MD ? ? PT End of Session - 11/17/21 1001   ? ? Visit Number 15   ? Number of Visits 22   ? Date for PT Re-Evaluation 12/07/21   ? PT Start Time 1003   ? PT Stop Time 6283   ? PT Time Calculation (min) 60 min   ? Activity Tolerance Patient tolerated treatment well   ? Behavior During Therapy [facility] for tasks assessed/performed   ? ?  ?  ? ?  ? ? ? ? ?Past Medical History:  ?Diagnosis Date  ? Allergy   ? Anemia   ? Arthritis   ? Asthma   ? Back pain, chronic   ? GETS INJECTIONS IN BACK  ? Blind right eye   ? hemorrhage  ? Blood transfusion   ? Cancer [facility])   ? breast 1994  ? Clotting disorder ([place])   ? Diabetes mellitus without complication ([place])   ? takes precose  ? Elevated liver enzymes   ? Gallstones   ? GERD (gastroesophageal reflux disease)   ? HX: breast cancer   ? Hyperlipidemia   ? Hypertension   ? Hypothyroid   ? Lymphedema of arm   ? RT  ? Morbid obesity ([place])   ? OSA (obstructive sleep apnea)   ? has not used in 2 years-lost weight  ? Osteoporosis   ? Peripheral neuropathy   ? on gabapentin  ? PONV (postoperative nausea and vomiting)   ? Psoriasis   ? Pulmonary embolism ([place]) 1998 / 1994 /1968  ? Syncope and collapse 2011  ? due to anemia  ? ?Past Surgical History:  ?Procedure Laterality Date  ? ABDOMINAL HYSTERECTOMY    ? BONE MARROW TRANSPLANT  1994  ? CARDIOVASCULAR STRESS TEST  05/23/2005  ? EF 53%  ? carpal tunnell    ? bil  ? cataracts    ? CHOLECYSTECTOMY  05/10/2012  ? Procedure: LAPAROSCOPIC CHOLECYSTECTOMY WITH INTRAOPERATIVE CHOLANGIOGRAM;  Surgeon: Pedro Earls, MD;  Location: [facility];  Service: General;  Laterality: N/A;  Laparoscopic Cholecystectomy with Intraoperative Cholangiogram  ? GASTRIC BYPASS  2011  ? bariatric surgery  ? HIATAL HERNIA REPAIR    ? IVC filter     ? recurrent DVT  ? KNEE ARTHROTOMY  1998  ? MASS EXCISION Right 11/24/2016  ? Procedure: EXCISION RIGHT AXILLARY LESION AND CHEST WALL LESION;  Surgeon: Johnathan Hausen, MD;  Location: [place];  Service: General;  Laterality: Right;  ? MASTECTOMY    ? Right  ? US ECHOCARDIOGRAPHY  10/27/08  ? EF 55-60%  ? ?Patient Active Problem List  ? Diagnosis Date Noted  ? Hypoglycemia following gastrointestinal surgery 03/19/2018  ? Edema of both lower extremities due to peripheral venous insufficiency 11/20/2017  ? Maxillary sinusitis, acute 08/31/2017  ? Encounter for therapeutic drug monitoring 08/26/2013  ? Insomnia 05/01/2013  ? Long term (current) use of anticoagulants 04/15/2013  ? Postsurgical dumping syndrome 04/06/2013  ? Bilateral leg edema 04/06/2013  ? Reactive hypoglycemia 04/02/2013  ? Abdominal pain intermittent-evalulating for internal hernia 11/07/2012  ? S/P laparoscopic cholecystectomy 05/31/2012  ? Roux Y Gastric Bypass April 2011; repair [facility] 11/22/2011  ? Unspecified deficiency anemia 11/14/2011  ? Left leg paresthesias 10/27/2010  ? Hypothyroid 10/27/2010  ? Low back pain 10/27/2010  ? Obstructive  sleep apnea 09/29/2007  ? PULMONARY EMBOLISM 09/25/2007  ? Seasonal and perennial allergic rhinitis 09/25/2007  ? Allergic asthma, mild persistent, uncomplicated 68/09/2120  ? BREAST CANCER, HX OF 09/25/2007  ? ? ?REFERRING DIAG: Rt UE lymphedema ? ?THERAPY DIAG:  ?Post-mastectomy lymphedema syndrome ? ?Postmastectomy lymphedema ? ?Secondary lymphedema ? ?PERTINENT HISTORY: PERTINENT HISTORY: Pt had right breast mastectomy in 1994. She had 10/13 LN's that were malignant. She had chemo and radiation as well as a bone marrow transplant in 1994. Went to a lymphedema clinic in [place], and had treatment here as well many years ago. Had a sleeve from 5 years ago but it is too tight. She takes Diurtetics and in the morning her legs are down a lot. Sometimes she feels swelling all over. Heart testing  was OK ? ?PRECAUTIONS: Rt lympehdema ? ?SUBJECTIVE:  ?Val wrapped me last Wednesday and I didn't come Friday. I wrapped my arm myself on Saturday night and Sunday night, but wore the sleeve during the day. Sunday night I wore the TG soft to bed, but wrapped on Monday. I was wrapped all day yesterday, but took it off this morning and put my sleeve on. ?PAIN:  ?Are you having pain? No, ? ? ? ?LYMPHEDEMA ASSESSMENTS:  ?  ?[employer] [facility] [employer] [employer] RIGHT  09/27/2021 Right 10/17/2021 Right 10/21/21 10/26/2021 11/04/21 11/09/21  ?10 cm proximal to olecranon process 40.8 39.7 37.5 38.9 38 36.8  ?Olecranon process 29.5 28.5 28.1 28.2 29 26.7  ?10 cm proximal to ulnar styloid process 31.9 30.0 28.4 28.5 27 27.9  ?Just proximal to ulnar styloid process 18.4 17.6 16.7 17.3 16.3 16.4  ?Across hand at thumb web space 19.5 19.6 18.6 19.5 19 18.6  ?At base of 2nd digit 6.0 5.7 5.6 5.7 5.8 5.7  ?(Blank rows = not tested) ?  ?  ?TODAY'S TREATMENT  ?Discussed garments that were being ordered for pt. She would like a ready made for a glove and the Magenta power sleeve for the Profile.  Pt had multiple questions about compression stockings and these were also addressed. ?In supine: Short neck, 5 diaphragmatic breaths, bil axillary nodes and establishment of anterior inter-axillary pathway, R inguinal nodes and establishment of Rt axillo-inguinal pathway, then R UE working proximal to distal, moving inner upper arm outwards and upwards, and doing both sides of forearms, spending extra time in any areas of fibrosis then retracing all steps and educating pt as I was performing   ?Compression bandaging to Rt UE, tg soft size medium, Elastomull fingers (fixated with paper tape) 1-4 ,  Rosidal soft from wrist to axilla, then 1 6cm hand and forearm , 8 cm  bandage right forearm,, 1 10cm (herring bone here), elbow to axilla, and 1 12 short stretch compression bandage applied from wrist to axilla. ? ?11/09/21: ?in supine: Short neck, 5 diaphragmatic breaths, bil  axillary nodes and establishment of anterior inter-axillary pathway, R inguinal nodes and establishment of Rt axillo-inguinal pathway, then R UE working proximal to distal, moving inner upper arm outwards and upwards, and doing both sides of forearms, spending extra time in any areas of fibrosis then retracing all steps.   ?Compression bandaging to Rt UE, tg soft size medium, Elastomull fingers (fixated with paper tape) 1-4 ,  Rosidal soft from wrist to axilla, then 1 6cm hand and forearm , 8 cm  bandage right forearm,, 1 10cm (herring bone here), elbow to axilla, and 1 12 short stretch compression bandage applied from wrist to axilla. ? ?11/07/21: ?In supine: Short  neck, 5 diaphragmatic breaths, Lt axillary nodes and establishment of anterior inter-axillary pathway, Rt inguinal nodes and establishment of Rt axillo-inguinal pathway, then Rt UE working proximal to distal, moving inner upper arm outwards and upwards, and doing both sides of forearms, spending extra time in any areas of fibrosis then retracing all steps.  ?Did not bandage today as pt forgot her bandages and she has already been given a second set.  ?11/04/21: ?in supine: Short neck, 5 diaphragmatic breaths, bil axillary nodes and establishment of anterior inter-axillary pathway, R inguinal nodes and establishment of Rt axillo-inguinal pathway, then R UE working proximal to distal, moving inner upper arm outwards and upwards, and doing both sides of forearms, spending extra time in any areas of fibrosis then retracing all steps.   ?Compression bandaging to Rt UE, tg soft size medium, Elastomull fingers (fixated with paper tape) 1-4 ,  Rosidal soft from wrist to axilla, then 1 6cm hand and forearm , 8 cm (herring bone here) bandage right forearm,, 1 10cm, elbow to axilla, and 1 12 short stretch compression bandage applied from wrist to axilla. ? ? ?PATIENT EDUCATION:  ?10/21/21: ?Education details: Self MLD ?Person educated: Patient ?Education method:  Explanation, Demo, Handout ?Education comprehension: verbalized understanding and returned demo ?  ?  ?[place]: ?Self MLD, see below ?  ?ASSESSMENT: ?  ?CLINICAL IMPRESSION: ?Pt is still awaiting

## 2021-11-22 ENCOUNTER — Ambulatory Visit: Payer: Medicare Other

## 2021-11-22 DIAGNOSIS — I890 Lymphedema, not elsewhere classified: Secondary | ICD-10-CM | POA: Diagnosis not present

## 2021-11-22 DIAGNOSIS — I972 Postmastectomy lymphedema syndrome: Secondary | ICD-10-CM | POA: Diagnosis not present

## 2021-11-22 NOTE — Therapy (Signed)
?OUTPATIENT PHYSICAL THERAPY TREATMENT NOTE ? ? ?Patient Name: Kelli Martinez ?MRN: [id number] ?DOB:10-19-1945, 76 y.o., female ?Today's Date: 11/22/2021 ? ?PCP: Billie Ruddy, MD ?REFERRING PROVIDER: Billie Ruddy, MD ? ? PT End of Session - 11/22/21 0901   ? ? Visit Number 16   ? Number of Visits 22   ? Date for PT Re-Evaluation 12/07/21   ? PT Start Time [phone]   ? PT Stop Time [phone]   ? PT Time Calculation (min) 53 min   ? Activity Tolerance Patient tolerated treatment well   ? Behavior During Therapy [facility] for tasks assessed/performed   ? ?  ?  ? ?  ? ? ? ? ?Past Medical History:  ?Diagnosis Date  ? Allergy   ? Anemia   ? Arthritis   ? Asthma   ? Back pain, chronic   ? GETS INJECTIONS IN BACK  ? Blind right eye   ? hemorrhage  ? Blood transfusion   ? Cancer [facility])   ? breast 1994  ? Clotting disorder ([place])   ? Diabetes mellitus without complication ([place])   ? takes precose  ? Elevated liver enzymes   ? Gallstones   ? GERD (gastroesophageal reflux disease)   ? HX: breast cancer   ? Hyperlipidemia   ? Hypertension   ? Hypothyroid   ? Lymphedema of arm   ? RT  ? Morbid obesity ([place])   ? OSA (obstructive sleep apnea)   ? has not used in 2 years-lost weight  ? Osteoporosis   ? Peripheral neuropathy   ? on gabapentin  ? PONV (postoperative nausea and vomiting)   ? Psoriasis   ? Pulmonary embolism ([place]) 1998 / 1994 /1968  ? Syncope and collapse 2011  ? due to anemia  ? ?Past Surgical History:  ?Procedure Laterality Date  ? ABDOMINAL HYSTERECTOMY    ? BONE MARROW TRANSPLANT  1994  ? CARDIOVASCULAR STRESS TEST  05/23/2005  ? EF 53%  ? carpal tunnell    ? bil  ? cataracts    ? CHOLECYSTECTOMY  05/10/2012  ? Procedure: LAPAROSCOPIC CHOLECYSTECTOMY WITH INTRAOPERATIVE CHOLANGIOGRAM;  Surgeon: Pedro Earls, MD;  [facility];  Service: General;  Laterality: N/A;  Laparoscopic Cholecystectomy with Intraoperative Cholangiogram  ? GASTRIC BYPASS  2011  ? bariatric surgery  ? HIATAL HERNIA REPAIR    ? IVC filter     ? recurrent DVT  ? KNEE ARTHROTOMY  1998  ? MASS EXCISION Right 11/24/2016  ? Procedure: EXCISION RIGHT AXILLARY LESION AND CHEST WALL LESION;  Surgeon: Johnathan Hausen, MD;  Location: [place];  Service: General;  Laterality: Right;  ? MASTECTOMY    ? Right  ? US ECHOCARDIOGRAPHY  10/27/08  ? EF 55-60%  ? ?Patient Active Problem List  ? Diagnosis Date Noted  ? Hypoglycemia following gastrointestinal surgery 03/19/2018  ? Edema of both lower extremities due to peripheral venous insufficiency 11/20/2017  ? Maxillary sinusitis, acute 08/31/2017  ? Encounter for therapeutic drug monitoring 08/26/2013  ? Insomnia 05/01/2013  ? Long term (current) use of anticoagulants 04/15/2013  ? Postsurgical dumping syndrome 04/06/2013  ? Bilateral leg edema 04/06/2013  ? Reactive hypoglycemia 04/02/2013  ? Abdominal pain intermittent-evalulating for internal hernia 11/07/2012  ? S/P laparoscopic cholecystectomy 05/31/2012  ? Roux Y Gastric Bypass April 2011; repair [facility] 11/22/2011  ? Unspecified deficiency anemia 11/14/2011  ? Left leg paresthesias 10/27/2010  ? Hypothyroid 10/27/2010  ? Low back pain 10/27/2010  ? Obstructive  sleep apnea 09/29/2007  ? PULMONARY EMBOLISM 09/25/2007  ? Seasonal and perennial allergic rhinitis 09/25/2007  ? Allergic asthma, mild persistent, uncomplicated 16/05/9603  ? BREAST CANCER, HX OF 09/25/2007  ? ? ?REFERRING DIAG: Rt UE lymphedema ? ?THERAPY DIAG:  ?Post-mastectomy lymphedema syndrome ? ?Postmastectomy lymphedema ? ?Secondary lymphedema ? ?PERTINENT HISTORY: PERTINENT HISTORY: Pt had right breast mastectomy in 1994. She had 10/13 LN's that were malignant. She had chemo and radiation as well as a bone marrow transplant in 1994. Went to a lymphedema clinic in [place], and had treatment here as well many years ago. Had a sleeve from 5 years ago but it is too tight. She takes Diurtetics and in the morning her legs are down a lot. Sometimes she feels swelling all over. Heart testing  was OK ? ?PRECAUTIONS: Rt lympehdema ? ?SUBJECTIVE:  ?I have been wrapping at night and wearing the sleeve during the day.  I wore the wrap from Sunday to this am and took my shower. I have decided I want the black sleeve and glove, and the [place] for the Profile. I  ? ?PAIN:  ?Are you having pain? No, ? ? ? ?LYMPHEDEMA ASSESSMENTS:  ?  ?[facility] RIGHT  09/27/2021 Right 10/17/2021 Right 10/21/21 10/26/2021 11/04/21 11/09/21 11/22/2021  ?10 cm proximal to olecranon process 40.8 39.7 37.5 38.9 38 36.8 39.8  ?Olecranon process 29.5 28.5 28.1 28.2 29 26.7 28.8  ?10 cm proximal to ulnar styloid process 31.9 30.0 28.4 28.5 27 27.9 29.3  ?Just proximal to ulnar styloid process 18.4 17.6 16.7 17.3 16.3 16.4 17.4  ?Across hand at thumb web space 19.5 19.6 18.6 19.5 19 18.6 19.2  ?At base of 2nd digit 6.0 5.7 5.6 5.7 5.8 5.7 5.8  ?(Blank rows = not tested) ?  ?  ?TODAY'S TREATMENT  ? ?11/22/2021 ?Emailed Lis new colors requested for compression sleeve and gauntlet. Remeasured pts arm with increases throughout and discussed the need for her to stay in bandages as much as possible until her new sleeve comes ?In supine: Short neck, 5 diaphragmatic breaths, bil axillary nodes and establishment of anterior inter-axillary pathway, R inguinal nodes and establishment of Rt axillo-inguinal pathway, then R UE working proximal to distal, moving inner upper arm outwards and upwards, and doing both sides of forearms, spending extra time in any areas of fibrosis then retracing all steps and educating pt as I was performing   ?Compression bandaging to Rt UE, tg soft size medium, Elastomull fingers (fixated with paper tape) 1-4 ,  Rosidal soft from wrist to axilla, then 1 6cm hand and forearm , 8 cm  bandage right forearm,, 1 10cm (herring bone here), elbow to axilla, and 1 12 short stretch compression bandage applied from wrist to axilla. ? ? ? ? ?11/17/2021 ?Discussed garments that were being ordered for pt. She would like a ready made for a  glove and the Magenta power sleeve for the Profile.  Pt had multiple questions about compression stockings and these were also addressed. ?In supine: Short neck, 5 diaphragmatic breaths, bil axillary nodes and establishment of anterior inter-axillary pathway, R inguinal nodes and establishment of Rt axillo-inguinal pathway, then R UE working proximal to distal, moving inner upper arm outwards and upwards, and doing both sides of forearms, spending extra time in any areas of fibrosis then retracing all steps and educating pt as I was performing   ?Compression bandaging to Rt UE, tg soft size medium, Elastomull fingers (fixated with paper tape) 1-4 ,  Rosidal  soft from wrist to axilla, then 1 6cm hand and forearm , 8 cm  bandage right forearm,, 1 10cm (herring bone here), elbow to axilla, and 1 12 short stretch compression bandage applied from wrist to axilla. ? ?11/09/21: ?in supine: Short neck, 5 diaphragmatic breaths, bil axillary nodes and establishment of anterior inter-axillary pathway, R inguinal nodes and establishment of Rt axillo-inguinal pathway, then R UE working proximal to distal, moving inner upper arm outwards and upwards, and doing both sides of forearms, spending extra time in any areas of fibrosis then retracing all steps.   ?Compression bandaging to Rt UE, tg soft size medium, Elastomull fingers (fixated with paper tape) 1-4 ,  Rosidal soft from wrist to axilla, then 1 6cm hand and forearm , 8 cm  bandage right forearm,, 1 10cm (herring bone here), elbow to axilla, and 1 12 short stretch compression bandage applied from wrist to axilla. ? ?11/07/21: ?In supine: Short neck, 5 diaphragmatic breaths, Lt axillary nodes and establishment of anterior inter-axillary pathway, Rt inguinal nodes and establishment of Rt axillo-inguinal pathway, then Rt UE working proximal to distal, moving inner upper arm outwards and upwards, and doing both sides of forearms, spending extra time in any areas of fibrosis then  retracing all steps.  ?Did not bandage today as pt forgot her bandages and she has already been given a second set.  ?11/04/21: ?in supine: Short neck, 5 diaphragmatic breaths, bil axillary nodes and es

## 2021-11-23 DIAGNOSIS — Z1211 Encounter for screening for malignant neoplasm of colon: Secondary | ICD-10-CM | POA: Diagnosis not present

## 2021-11-24 ENCOUNTER — Ambulatory Visit: Payer: Medicare Other

## 2021-11-24 DIAGNOSIS — I972 Postmastectomy lymphedema syndrome: Secondary | ICD-10-CM | POA: Diagnosis not present

## 2021-11-24 DIAGNOSIS — I890 Lymphedema, not elsewhere classified: Secondary | ICD-10-CM

## 2021-11-24 NOTE — Therapy (Signed)
?OUTPATIENT PHYSICAL THERAPY TREATMENT NOTE ? ? ?Patient Name: Kelli Martinez ?MRN: [id number] ?DOB:March 25, 1946, 76 y.o., female ?Today's Date: 11/24/2021 ? ?PCP: Billie Ruddy, MD ?REFERRING PROVIDER: Billie Ruddy, MD ? ? PT End of Session - 11/24/21 0917   ? ? Visit Number 17   ? Number of Visits 22   ? Date for PT Re-Evaluation 12/07/21   ? PT Start Time [phone]   ? PT Stop Time 1006   ? PT Time Calculation (min) 50 min   ? Activity Tolerance Patient tolerated treatment well   ? Behavior During Therapy [facility] for tasks assessed/performed   ? ?  ?  ? ?  ? ? ? ? ?Past Medical History:  ?Diagnosis Date  ? Allergy   ? Anemia   ? Arthritis   ? Asthma   ? Back pain, chronic   ? GETS INJECTIONS IN BACK  ? Blind right eye   ? hemorrhage  ? Blood transfusion   ? Cancer [employer])   ? breast 1994  ? Clotting disorder ([place])   ? Diabetes mellitus without complication ([place])   ? takes precose  ? Elevated liver enzymes   ? Gallstones   ? GERD (gastroesophageal reflux disease)   ? HX: breast cancer   ? Hyperlipidemia   ? Hypertension   ? Hypothyroid   ? Lymphedema of arm   ? RT  ? Morbid obesity ([place])   ? OSA (obstructive sleep apnea)   ? has not used in 2 years-lost weight  ? Osteoporosis   ? Peripheral neuropathy   ? on gabapentin  ? PONV (postoperative nausea and vomiting)   ? Psoriasis   ? Pulmonary embolism ([place]) 1998 / 1994 /1968  ? Syncope and collapse 2011  ? due to anemia  ? ?Past Surgical History:  ?Procedure Laterality Date  ? ABDOMINAL HYSTERECTOMY    ? BONE MARROW TRANSPLANT  1994  ? CARDIOVASCULAR STRESS TEST  05/23/2005  ? EF 53%  ? carpal tunnell    ? bil  ? cataracts    ? CHOLECYSTECTOMY  05/10/2012  ? Procedure: LAPAROSCOPIC CHOLECYSTECTOMY WITH INTRAOPERATIVE CHOLANGIOGRAM;  Surgeon: Pedro Earls, MD;  Location: [facility];  Service: General;  Laterality: N/A;  Laparoscopic Cholecystectomy with Intraoperative Cholangiogram  ? GASTRIC BYPASS  2011  ? bariatric surgery  ? HIATAL HERNIA REPAIR    ? IVC filter     ? recurrent DVT  ? KNEE ARTHROTOMY  1998  ? MASS EXCISION Right 11/24/2016  ? Procedure: EXCISION RIGHT AXILLARY LESION AND CHEST WALL LESION;  Surgeon: Johnathan Hausen, MD;  Location: [place];  Service: General;  Laterality: Right;  ? MASTECTOMY    ? Right  ? US ECHOCARDIOGRAPHY  10/27/08  ? EF 55-60%  ? ?Patient Active Problem List  ? Diagnosis Date Noted  ? Hypoglycemia following gastrointestinal surgery 03/19/2018  ? Edema of both lower extremities due to peripheral venous insufficiency 11/20/2017  ? Maxillary sinusitis, acute 08/31/2017  ? Encounter for therapeutic drug monitoring 08/26/2013  ? Insomnia 05/01/2013  ? Long term (current) use of anticoagulants 04/15/2013  ? Postsurgical dumping syndrome 04/06/2013  ? Bilateral leg edema 04/06/2013  ? Reactive hypoglycemia 04/02/2013  ? Abdominal pain intermittent-evalulating for internal hernia 11/07/2012  ? S/P laparoscopic cholecystectomy 05/31/2012  ? Roux Y Gastric Bypass April 2011; repair [facility] 11/22/2011  ? Unspecified deficiency anemia 11/14/2011  ? Left leg paresthesias 10/27/2010  ? Hypothyroid 10/27/2010  ? Low back pain 10/27/2010  ? Obstructive  sleep apnea 09/29/2007  ? PULMONARY EMBOLISM 09/25/2007  ? Seasonal and perennial allergic rhinitis 09/25/2007  ? Allergic asthma, mild persistent, uncomplicated 08/02/7251  ? BREAST CANCER, HX OF 09/25/2007  ? ? ?REFERRING DIAG: Rt UE lymphedema ? ?THERAPY DIAG:  ?Post-mastectomy lymphedema syndrome ? ?Postmastectomy lymphedema ? ?Secondary lymphedema ? ?PERTINENT HISTORY: PERTINENT HISTORY: Pt had right breast mastectomy in 1994. She had 10/13 LN's that were malignant. She had chemo and radiation as well as a bone marrow transplant in 1994. Went to a lymphedema clinic in [place], and had treatment here as well many years ago. Had a sleeve from 5 years ago but it is too tight. She takes Diurtetics and in the morning her legs are down a lot. Sometimes she feels swelling all over. Heart testing  was OK ? ?PRECAUTIONS: Rt lympehdema ? ?SUBJECTIVE:  ?I kept on the wrap that you did, but I had to put some tape on the fingers ? ?PAIN:  ?Are you having pain? No, ? ? ? ?LYMPHEDEMA ASSESSMENTS:  ?  ?[facility] RIGHT  09/27/2021 Right 10/17/2021 Right 10/21/21 10/26/2021 11/04/21 11/09/21 11/22/2021 11/24/2021  ?10 cm proximal to olecranon process 40.8 39.7 37.5 38.9 38 36.8 39.8   ?Olecranon process 29.5 28.5 28.1 28.2 29 26.7 28.8 28.3  ?10 cm proximal to ulnar styloid process 31.9 30.0 28.4 28.5 27 27.9 29.3 27.7  ?Just proximal to ulnar styloid process 18.4 17.6 16.7 17.3 16.3 16.4 17.4   ?Across hand at thumb web space 19.5 19.6 18.6 19.5 19 18.6 19.2   ?At base of 2nd digit 6.0 5.7 5.6 5.7 5.8 5.7 5.8   ?(Blank rows = not tested) ?  ?  ?TODAY'S TREATMENT  ?11/24/2021 ?Wraps removed and pt remeasured ?In supine: Short neck, 5 diaphragmatic breaths, bil axillary nodes and establishment of anterior inter-axillary pathway, R inguinal nodes and establishment of Rt axillo-inguinal pathway, then R UE working proximal to distal, moving inner upper arm outwards and upwards, and doing both sides of forearms, spending extra time in any areas of fibrosis then retracing all steps and educating pt as I was performing   ?Compression bandaging to Rt UE, tg soft size medium, Elastomull fingers (fixated with paper tape) 1-4 ,  Rosidal soft from wrist to axilla, then 1 6cm hand and forearm , 8 cm  bandage right forearm,, 1 10cm (herring bone here), elbow to axilla, and 1 12 short stretch compression bandage applied from wrist to axilla. ?11/22/2021 ?Emailed [employer] new colors requested for compression sleeve and gauntlet. Remeasured pts arm with increases throughout and discussed the need for her to stay in bandages as much as possible until her new sleeve comes ?In supine: Short neck, 5 diaphragmatic breaths, bil axillary nodes and establishment of anterior inter-axillary pathway, R inguinal nodes and establishment of Rt axillo-inguinal  pathway, then R UE working proximal to distal, moving inner upper arm outwards and upwards, and doing both sides of forearms, spending extra time in any areas of fibrosis then retracing all steps and educating pt as I was performing   ?Compression bandaging to Rt UE, tg soft size medium, Elastomull fingers (fixated with paper tape) 1-4 ,  Rosidal soft from wrist to axilla, then 1 6cm hand and forearm , 8 cm  bandage right forearm,, 1 10cm (herring bone here), elbow to axilla, and 1 12 short stretch compression bandage applied from wrist to axilla. ? ? ? ? ?11/17/2021 ?Discussed garments that were being ordered for pt. She would like a ready made for a  glove and the Magenta power sleeve for the Profile.  Pt had multiple questions about compression stockings and these were also addressed. ?In supine: Short neck, 5 diaphragmatic breaths, bil axillary nodes and establishment of anterior inter-axillary pathway, R inguinal nodes and establishment of Rt axillo-inguinal pathway, then R UE working proximal to distal, moving inner upper arm outwards and upwards, and doing both sides of forearms, spending extra time in any areas of fibrosis then retracing all steps and educating pt as I was performing   ?Compression bandaging to Rt UE, tg soft size medium, Elastomull fingers (fixated with paper tape) 1-4 ,  Rosidal soft from wrist to axilla, then 1 6cm hand and forearm , 8 cm  bandage right forearm,, 1 10cm (herring bone here), elbow to axilla, and 1 12 short stretch compression bandage applied from wrist to axilla. ? ?11/09/21: ?in supine: Short neck, 5 diaphragmatic breaths, bil axillary nodes and establishment of anterior inter-axillary pathway, R inguinal nodes and establishment of Rt axillo-inguinal pathway, then R UE working proximal to distal, moving inner upper arm outwards and upwards, and doing both sides of forearms, spending extra time in any areas of fibrosis then retracing all steps.   ?Compression bandaging to Rt  UE, tg soft size medium, Elastomull fingers (fixated with paper tape) 1-4 ,  Rosidal soft from wrist to axilla, then 1 6cm hand and forearm , 8 cm  bandage right forearm,, 1 10cm (herring bone here), elbo

## 2021-11-25 DIAGNOSIS — Z853 Personal history of malignant neoplasm of breast: Secondary | ICD-10-CM | POA: Diagnosis not present

## 2021-11-25 DIAGNOSIS — Z9884 Bariatric surgery status: Secondary | ICD-10-CM | POA: Diagnosis not present

## 2021-11-28 ENCOUNTER — Telehealth: Payer: Self-pay

## 2021-11-28 NOTE — Telephone Encounter (Signed)
Patient called in requesting refill of omperazole to be sent in to pharmacy. Is it ok to send? ?

## 2021-11-29 ENCOUNTER — Ambulatory Visit: Payer: Medicare Other | Attending: Family Medicine

## 2021-11-29 DIAGNOSIS — I890 Lymphedema, not elsewhere classified: Secondary | ICD-10-CM | POA: Insufficient documentation

## 2021-11-29 DIAGNOSIS — I972 Postmastectomy lymphedema syndrome: Secondary | ICD-10-CM | POA: Diagnosis not present

## 2021-11-29 NOTE — Therapy (Signed)
?OUTPATIENT PHYSICAL THERAPY TREATMENT NOTE ? ? ?Patient Name: Kelli Martinez ?MRN: [id number] ?DOB:1946-01-31, 76 y.o., female ?Today's Date: 11/29/2021 ? ?PCP: Billie Ruddy, MD ?REFERRING PROVIDER: Billie Ruddy, MD ? ? PT End of Session - 11/29/21 0902   ? ? Visit Number 18   ? Number of Visits 22   ? Date for PT Re-Evaluation 12/07/21   ? PT Start Time [phone]   ? PT Stop Time 1002   ? PT Time Calculation (min) 59 min   ? Activity Tolerance Patient tolerated treatment well   ? Behavior During Therapy [facility] for tasks assessed/performed   ? ?  ?  ? ?  ? ? ? ? ?Past Medical History:  ?Diagnosis Date  ? Allergy   ? Anemia   ? Arthritis   ? Asthma   ? Back pain, chronic   ? GETS INJECTIONS IN BACK  ? Blind right eye   ? hemorrhage  ? Blood transfusion   ? Cancer [facility])   ? breast 1994  ? Clotting disorder ([place])   ? Diabetes mellitus without complication ([place])   ? takes precose  ? Elevated liver enzymes   ? Gallstones   ? GERD (gastroesophageal reflux disease)   ? HX: breast cancer   ? Hyperlipidemia   ? Hypertension   ? Hypothyroid   ? Lymphedema of arm   ? RT  ? Morbid obesity ([place])   ? OSA (obstructive sleep apnea)   ? has not used in 2 years-lost weight  ? Osteoporosis   ? Peripheral neuropathy   ? on gabapentin  ? PONV (postoperative nausea and vomiting)   ? Psoriasis   ? Pulmonary embolism ([place]) 1998 / 1994 /1968  ? Syncope and collapse 2011  ? due to anemia  ? ?Past Surgical History:  ?Procedure Laterality Date  ? ABDOMINAL HYSTERECTOMY    ? BONE MARROW TRANSPLANT  1994  ? CARDIOVASCULAR STRESS TEST  05/23/2005  ? EF 53%  ? carpal tunnell    ? bil  ? cataracts    ? CHOLECYSTECTOMY  05/10/2012  ? Procedure: LAPAROSCOPIC CHOLECYSTECTOMY WITH INTRAOPERATIVE CHOLANGIOGRAM;  Surgeon: Pedro Earls, MD;  [facility];  Service: General;  Laterality: N/A;  Laparoscopic Cholecystectomy with Intraoperative Cholangiogram  ? GASTRIC BYPASS  2011  ? bariatric surgery  ? HIATAL HERNIA REPAIR    ? IVC filter     ? recurrent DVT  ? KNEE ARTHROTOMY  1998  ? MASS EXCISION Right 11/24/2016  ? Procedure: EXCISION RIGHT AXILLARY LESION AND CHEST WALL LESION;  Surgeon: Johnathan Hausen, MD;  Location: [place];  Service: General;  Laterality: Right;  ? MASTECTOMY    ? Right  ? US ECHOCARDIOGRAPHY  10/27/08  ? EF 55-60%  ? ?Patient Active Problem List  ? Diagnosis Date Noted  ? Hypoglycemia following gastrointestinal surgery 03/19/2018  ? Edema of both lower extremities due to peripheral venous insufficiency 11/20/2017  ? Maxillary sinusitis, acute 08/31/2017  ? Encounter for therapeutic drug monitoring 08/26/2013  ? Insomnia 05/01/2013  ? Long term (current) use of anticoagulants 04/15/2013  ? Postsurgical dumping syndrome 04/06/2013  ? Bilateral leg edema 04/06/2013  ? Reactive hypoglycemia 04/02/2013  ? Abdominal pain intermittent-evalulating for internal hernia 11/07/2012  ? S/P laparoscopic cholecystectomy 05/31/2012  ? Roux Y Gastric Bypass April 2011; repair [facility] 11/22/2011  ? Unspecified deficiency anemia 11/14/2011  ? Left leg paresthesias 10/27/2010  ? Hypothyroid 10/27/2010  ? Low back pain 10/27/2010  ? Obstructive  sleep apnea 09/29/2007  ? PULMONARY EMBOLISM 09/25/2007  ? Seasonal and perennial allergic rhinitis 09/25/2007  ? Allergic asthma, mild persistent, uncomplicated 35/00/9381  ? BREAST CANCER, HX OF 09/25/2007  ? ? ?REFERRING DIAG: Rt UE lymphedema ? ?THERAPY DIAG:  ?Post-mastectomy lymphedema syndrome ? ?Postmastectomy lymphedema ? ?Secondary lymphedema ? ?PERTINENT HISTORY: PERTINENT HISTORY: Pt had right breast mastectomy in 1994. She had 10/13 LN's that were malignant. She had chemo and radiation as well as a bone marrow transplant in 1994. Went to a lymphedema clinic in [place], and had treatment here as well many years ago. Had a sleeve from 5 years ago but it is too tight. She takes Diurtetics and in the morning her legs are down a lot. Sometimes she feels swelling all over. Heart testing  was OK ? ?PRECAUTIONS: Rt lympehdema ? ?SUBJECTIVE:  ?I kept on the wrap that I put on myself Sunday night because you wanted to see it. ? ?PAIN:  ?Are you having pain? No, ? ? ? ?LYMPHEDEMA ASSESSMENTS:  ?  ?[facility] RIGHT  09/27/2021 Right 10/17/2021 Right 10/21/21 10/26/2021 11/04/21 11/09/21 11/22/2021 11/24/2021  ?10 cm proximal to olecranon process 40.8 39.7 37.5 38.9 38 36.8 39.8   ?Olecranon process 29.5 28.5 28.1 28.2 29 26.7 28.8 28.3  ?10 cm proximal to ulnar styloid process 31.9 30.0 28.4 28.5 27 27.9 29.3 27.7  ?Just proximal to ulnar styloid process 18.4 17.6 16.7 17.3 16.3 16.4 17.4   ?Across hand at thumb web space 19.5 19.6 18.6 19.5 19 18.6 19.2   ?At base of 2nd digit 6.0 5.7 5.6 5.7 5.8 5.7 5.8   ?(Blank rows = not tested) ?  ?  ?TODAY'S TREATMENT  ? ?11/29/2021 ?Pts wrap removed and technique assessed. For the most part well done , but had fingers connected to one another with finger wrap and had gone under hand ?Showed pt how to don profile night garment and oversleeve.  Garment was comfortable and a good fit. ?In supine: Short neck, 5 diaphragmatic breaths, bil axillary nodes and establishment of anterior inter-axillary pathway, R inguinal nodes and establishment of Rt axillo-inguinal pathway, then R UE working proximal to distal, moving inner upper arm outwards and upwards, and doing both sides of forearms, spending extra time in any areas of fibrosis then retracing all steps and educating pt as I was performing   ?Compression bandaging to Rt UE, tg soft size medium, Elastomull fingers (fixated with paper tape) 1-4 ,  Rosidal soft from wrist to axilla, then 1 6cm hand and forearm , 8 cm  bandage right forearm,, 1 10cm (herring bone here), elbow to axilla, and 1 12 short stretch compression bandage applied from wrist to axilla. ? ?11/24/2021 ?Wraps removed and pt remeasured ?In supine: Short neck, 5 diaphragmatic breaths, bil axillary nodes and establishment of anterior inter-axillary pathway, R inguinal  nodes and establishment of Rt axillo-inguinal pathway, then R UE working proximal to distal, moving inner upper arm outwards and upwards, and doing both sides of forearms, spending extra time in any areas of fibrosis then retracing all steps and educating pt as I was performing   ?Compression bandaging to Rt UE, tg soft size medium, Elastomull fingers (fixated with paper tape) 1-4 ,  Rosidal soft from wrist to axilla, then 1 6cm hand and forearm , 8 cm  bandage right forearm,, 1 10cm (herring bone here), elbow to axilla, and 1 12 short stretch compression bandage applied from wrist to axilla. ?11/22/2021 ?Emailed [employer] new colors requested for compression  sleeve and gauntlet. Remeasured pts arm with increases throughout and discussed the need for her to stay in bandages as much as possible until her new sleeve comes ?In supine: Short neck, 5 diaphragmatic breaths, bil axillary nodes and establishment of anterior inter-axillary pathway, R inguinal nodes and establishment of Rt axillo-inguinal pathway, then R UE working proximal to distal, moving inner upper arm outwards and upwards, and doing both sides of forearms, spending extra time in any areas of fibrosis then retracing all steps and educating pt as I was performing   ?Compression bandaging to Rt UE, tg soft size medium, Elastomull fingers (fixated with paper tape) 1-4 ,  Rosidal soft from wrist to axilla, then 1 6cm hand and forearm , 8 cm  bandage right forearm,, 1 10cm (herring bone here), elbow to axilla, and 1 12 short stretch compression bandage applied from wrist to axilla. ? ? ? ? ?11/17/2021 ?Discussed garments that were being ordered for pt. She would like a ready made for a glove and the Magenta power sleeve for the Profile.  Pt had multiple questions about compression stockings and these were also addressed. ?In supine: Short neck, 5 diaphragmatic breaths, bil axillary nodes and establishment of anterior inter-axillary pathway, R inguinal nodes and  establishment of Rt axillo-inguinal pathway, then R UE working proximal to distal, moving inner upper arm outwards and upwards, and doing both sides of forearms, spending extra time in any areas of fibros

## 2021-11-30 ENCOUNTER — Other Ambulatory Visit: Payer: Self-pay | Admitting: Internal Medicine

## 2021-11-30 ENCOUNTER — Other Ambulatory Visit: Payer: Self-pay | Admitting: Family Medicine

## 2021-11-30 NOTE — Telephone Encounter (Signed)
Pt notified that 20-47mhg should be fine. Pt verb understanding. ? ?Pt also requesting Dr BVolanda Napoleontake over refills for omeprazole which was previously being filled by Dr KDwyane Dee Pt is requesting 90 supply to [facility] & short 14-day supply to [facility] on file. Will send to PCP for approval. ?

## 2021-11-30 NOTE — Telephone Encounter (Signed)
20-90mHg is fine.  I'm not sure which range pt had in the past, but I'm concerned the higher ones would be difficult for her to get on. ?

## 2021-11-30 NOTE — Telephone Encounter (Signed)
Ok to send omperazole to local pharmacy [facility]. ?

## 2021-12-01 ENCOUNTER — Other Ambulatory Visit: Payer: Self-pay

## 2021-12-01 MED ORDER — OMEPRAZOLE 20 MG PO CPDR: 20.0000 mg | DELAYED_RELEASE_CAPSULE | Freq: Every day | ORAL | 1 refills | Status: DC

## 2021-12-01 MED ORDER — OMEPRAZOLE 20 MG PO CPDR: 20.0000 mg | DELAYED_RELEASE_CAPSULE | Freq: Every day | ORAL | 0 refills | Status: DC

## 2021-12-01 NOTE — Addendum Note (Signed)
Addended by: Anderson Malta on: 12/01/2021 08:07 AM ? ? Modules accepted: Orders ? ?

## 2021-12-01 NOTE — Addendum Note (Signed)
Addended by: Anderson Malta on: 12/01/2021 08:03 AM ? ? Modules accepted: Orders ? ?

## 2021-12-02 ENCOUNTER — Ambulatory Visit: Payer: Medicare Other

## 2021-12-02 ENCOUNTER — Ambulatory Visit (INDEPENDENT_AMBULATORY_CARE_PROVIDER_SITE_OTHER): Payer: Medicare Other

## 2021-12-02 DIAGNOSIS — I972 Postmastectomy lymphedema syndrome: Secondary | ICD-10-CM | POA: Diagnosis not present

## 2021-12-02 DIAGNOSIS — I890 Lymphedema, not elsewhere classified: Secondary | ICD-10-CM

## 2021-12-02 DIAGNOSIS — Z7901 Long term (current) use of anticoagulants: Secondary | ICD-10-CM | POA: Diagnosis not present

## 2021-12-02 LAB — POCT INR: INR: 2.2 (ref 2.0–3.0)

## 2021-12-02 NOTE — Progress Notes (Signed)
Continue to take 2 tablets daily except 1 tablet on Wed and Saturdays.  Re-check in 4 weeks.  

## 2021-12-02 NOTE — Therapy (Signed)
?OUTPATIENT PHYSICAL THERAPY TREATMENT NOTE ? ? ?Patient Name: Kelli Martinez ?MRN: [id number] ?DOB:[birth year], 76 y.o., female ?Today's Date: 12/02/2021 ? ?PCP: Billie Ruddy, MD ?REFERRING PROVIDER: Billie Ruddy, MD ? ? PT End of Session - 12/02/21 0909   ? ? Visit Number 19   ? Number of Visits 21   ? Date for PT Re-Evaluation 12/30/21   ? PT Start Time [id number]   ? PT Stop Time 0950   ? PT Time Calculation (min) 43 min   ? Activity Tolerance Patient tolerated treatment well   ? Behavior During Therapy [facility] for tasks assessed/performed   ? ?  ?  ? ?  ? ? ? ? ?Past Medical History:  ?Diagnosis Date  ? Allergy   ? Anemia   ? Arthritis   ? Asthma   ? Back pain, chronic   ? GETS INJECTIONS IN BACK  ? Blind right eye   ? hemorrhage  ? Blood transfusion   ? Cancer [facility])   ? breast 1994  ? Clotting disorder ([place])   ? Diabetes mellitus without complication ([place])   ? takes precose  ? Elevated liver enzymes   ? Gallstones   ? GERD (gastroesophageal reflux disease)   ? HX: breast cancer   ? Hyperlipidemia   ? Hypertension   ? Hypothyroid   ? Lymphedema of arm   ? RT  ? Morbid obesity ([place])   ? OSA (obstructive sleep apnea)   ? has not used in 2 years-lost weight  ? Osteoporosis   ? Peripheral neuropathy   ? on gabapentin  ? PONV (postoperative nausea and vomiting)   ? Psoriasis   ? Pulmonary embolism ([place]) 1998 / 1994 /1968  ? Syncope and collapse 2011  ? due to anemia  ? ?Past Surgical History:  ?Procedure Laterality Date  ? ABDOMINAL HYSTERECTOMY    ? BONE MARROW TRANSPLANT  1994  ? CARDIOVASCULAR STRESS TEST  05/23/2005  ? EF 53%  ? carpal tunnell    ? bil  ? cataracts    ? CHOLECYSTECTOMY  05/10/2012  ? Procedure: LAPAROSCOPIC CHOLECYSTECTOMY WITH INTRAOPERATIVE CHOLANGIOGRAM;  Surgeon: Pedro Earls, MD;  Location: [facility];  Service: General;  Laterality: N/A;  Laparoscopic Cholecystectomy with Intraoperative Cholangiogram  ? GASTRIC BYPASS  2011  ? bariatric surgery  ? HIATAL HERNIA REPAIR    ? IVC filter     ? recurrent DVT  ? KNEE ARTHROTOMY  1998  ? MASS EXCISION Right 11/24/2016  ? Procedure: EXCISION RIGHT AXILLARY LESION AND CHEST WALL LESION;  Surgeon: Johnathan Hausen, MD;  Location: [place];  Service: General;  Laterality: Right;  ? MASTECTOMY    ? Right  ? US ECHOCARDIOGRAPHY  10/27/08  ? EF 55-60%  ? ?Patient Active Problem List  ? Diagnosis Date Noted  ? Hypoglycemia following gastrointestinal surgery 03/19/2018  ? Edema of both lower extremities due to peripheral venous insufficiency 11/20/2017  ? Maxillary sinusitis, acute 08/31/2017  ? Encounter for therapeutic drug monitoring 08/26/2013  ? Insomnia 05/01/2013  ? Long term (current) use of anticoagulants 04/15/2013  ? Postsurgical dumping syndrome 04/06/2013  ? Bilateral leg edema 04/06/2013  ? Reactive hypoglycemia 04/02/2013  ? Abdominal pain intermittent-evalulating for internal hernia 11/07/2012  ? S/P laparoscopic cholecystectomy 05/31/2012  ? Roux Y Gastric Bypass April 2011; repair [facility] 11/22/2011  ? Unspecified deficiency anemia 11/14/2011  ? Left leg paresthesias 10/27/2010  ? Hypothyroid 10/27/2010  ? Low back pain 10/27/2010  ? Obstructive  sleep apnea 09/29/2007  ? PULMONARY EMBOLISM 09/25/2007  ? Seasonal and perennial allergic rhinitis 09/25/2007  ? Allergic asthma, mild persistent, uncomplicated 44/09/4740  ? BREAST CANCER, HX OF 09/25/2007  ? ? ?REFERRING DIAG: Rt UE lymphedema ? ?THERAPY DIAG:  ?Post-mastectomy lymphedema syndrome ? ?Postmastectomy lymphedema ? ?Secondary lymphedema ? ?PERTINENT HISTORY: PERTINENT HISTORY: Pt had right breast mastectomy in 1994. She had 10/13 LN's that were malignant. She had chemo and radiation as well as a bone marrow transplant in 1994. Went to a lymphedema clinic in [place], and had treatment here as well many years ago. Had a sleeve from 5 years ago but it is too tight. She takes Diurtetics and in the morning her legs are down a lot. Sometimes she feels swelling all over. Heart testing  was OK ? ?PRECAUTIONS: Rt lympehdema ? ?SUBJECTIVE:  ?.I love my night profile sleeve.  I wore it all day yesterday and I didn't have any trouble getting it on. The day garments were at my house when I got home after last visit. My arm looked so small after wearing the night garment all day because I wasn't going anywhere ? ?PAIN:  ?Are you having pain? No, ? ? ? ?LYMPHEDEMA ASSESSMENTS:  ?  ?[facility] RIGHT  09/27/2021 Right 10/17/2021 Right 10/21/21 10/26/2021 11/04/21 11/09/21 11/22/2021 11/24/2021  ?10 cm proximal to olecranon process 40.8 39.7 37.5 38.9 38 36.8 39.8   ?Olecranon process 29.5 28.5 28.1 28.2 29 26.7 28.8 28.3  ?10 cm proximal to ulnar styloid process 31.9 30.0 28.4 28.5 27 27.9 29.3 27.7  ?Just proximal to ulnar styloid process 18.4 17.6 16.7 17.3 16.3 16.4 17.4   ?Across hand at thumb web space 19.5 19.6 18.6 19.5 19 18.6 19.2   ?At base of 2nd digit 6.0 5.7 5.6 5.7 5.8 5.7 5.8   ?(Blank rows = not tested) ?  ?  ?TODAY'S TREATMENT  ?12/02/2021 ?Pt brougt in her new daytime sleeve and glove.  She was shown how to don with jobst gloves and using table to stabilize arm. Some difficulty initially getting it high enough because it was down too low at her wrist.  She was able to don the glove without difficulty.  She liked the fit of all and is considering getting a second set of a different color but was advised to wear this set for a short while before ordering another set.  We discussed laundering and I showed pt on [employer] and [employer] how she can order donning gloves, and another sleeve/glove if she likes.  She prefers at this time to go through [facility].  I showed her several donning options but she felt she could don independently and chose not to try either device.  We will set up an appt in 2-3 weeks to reassess her arm and discharge. ? ?11/29/2021 ?Pts wrap removed and technique assessed. For the most part well done , but had fingers connected to one another with finger wrap and had gone under  hand ?Showed pt how to don profile night garment and oversleeve.  Garment was comfortable and a good fit. ?In supine: Short neck, 5 diaphragmatic breaths, bil axillary nodes and establishment of anterior inter-axillary pathway, R inguinal nodes and establishment of Rt axillo-inguinal pathway, then R UE working proximal to distal, moving inner upper arm outwards and upwards, and doing both sides of forearms, spending extra time in any areas of fibrosis then retracing all steps and educating pt as I was performing   ?Compression bandaging to  Rt UE, tg soft size medium, Elastomull fingers (fixated with paper tape) 1-4 ,  Rosidal soft from wrist to axilla, then 1 6cm hand and forearm , 8 cm  bandage right forearm,, 1 10cm (herring bone here), elbow to axilla, and 1 12 short stretch compression bandage applied from wrist to axilla. ? ?11/24/2021 ?Wraps removed and pt remeasured ?In supine: Short neck, 5 diaphragmatic breaths, bil axillary nodes and establishment of anterior inter-axillary pathway, R inguinal nodes and establishment of Rt axillo-inguinal pathway, then R UE working proximal to distal, moving inner upper arm outwards and upwards, and doing both sides of forearms, spending extra time in any areas of fibrosis then retracing all steps and educating pt as I was performing   ?Compression bandaging to Rt UE, tg soft size medium, Elastomull fingers (fixated with paper tape) 1-4 ,  Rosidal soft from wrist to axilla, then 1 6cm hand and forearm , 8 cm  bandage right forearm,, 1 10cm (herring bone here), elbow to axilla, and 1 12 short stretch compression bandage applied from wrist to axilla. ?11/22/2021 ?Emailed Lis new colors requested for compression sleeve and gauntlet. Remeasured pts arm with increases throughout and discussed the need for her to stay in bandages as much as possible until her new sleeve comes ?In supine: Short neck, 5 diaphragmatic breaths, bil axillary nodes and establishment of anterior  inter-axillary pathway, R inguinal nodes and establishment of Rt axillo-inguinal pathway, then R UE working proximal to distal, moving inner upper arm outwards and upwards, and doing both sides of forearms, spendi

## 2021-12-02 NOTE — Telephone Encounter (Signed)
Ok

## 2021-12-02 NOTE — Patient Instructions (Addendum)
Pre visit review using our clinic review tool, if applicable. No additional management support is needed unless otherwise documented below in the visit note.  Continue to take 2 tablets daily except 1 tablet on Wed and Saturdays.  Re-check in 4 weeks.  

## 2021-12-03 LAB — COLOGUARD: COLOGUARD: NEGATIVE

## 2021-12-05 ENCOUNTER — Telehealth: Payer: Self-pay | Admitting: Family Medicine

## 2021-12-05 NOTE — Telephone Encounter (Signed)
Pt is calling and would like cologuard results ?

## 2021-12-09 ENCOUNTER — Ambulatory Visit: Payer: Medicare Other

## 2021-12-14 ENCOUNTER — Ambulatory Visit (INDEPENDENT_AMBULATORY_CARE_PROVIDER_SITE_OTHER): Payer: Medicare Other | Admitting: Internal Medicine

## 2021-12-14 VITALS — BP 126/70 | HR 68 | Ht 67.5 in | Wt 202.0 lb

## 2021-12-14 DIAGNOSIS — E063 Autoimmune thyroiditis: Secondary | ICD-10-CM

## 2021-12-14 DIAGNOSIS — E1169 Type 2 diabetes mellitus with other specified complication: Secondary | ICD-10-CM

## 2021-12-14 NOTE — Patient Instructions (Signed)
Medication Instructions:  ?Your physician recommends that you continue on your current medications as directed. Please refer to the Current Medication list given to you today. ? ?*If you need a refill on your cardiac medications before your next appointment, please call your pharmacy* ? ? ?Lab Work: ?CMET, TSH, A1C today ? ?If you have labs (blood work) drawn today and your tests are completely normal, you will receive your results only by: ?MyChart Message (if you have MyChart) OR ?A paper copy in the mail ?If you have any lab test that is abnormal or we need to change your treatment, we will call you to review the results. ? ?Follow-Up: ?At [facility], you and your health needs are our priority.  As part of our continuing mission to provide you with exceptional heart care, we have created designated Provider Care Teams.  These Care Teams include your primary Cardiologist (physician) and Advanced Practice Providers (APPs -  Physician Assistants and Nurse Practitioners) who all work together to provide you with the care you need, when you need it. ? ?We recommend signing up for the patient portal called "MyChart".  Sign up information is provided on this After Visit Summary.  MyChart is used to connect with patients for Virtual Visits (Telemedicine).  Patients are able to view lab/test results, encounter notes, upcoming appointments, etc.  Non-urgent messages can be sent to your provider as well.   ?To learn more about what you can do with MyChart, go to [email or web address].   ? ?Your next appointment:   ?6 month(s) ? ?The format for your next appointment:   ?In Person ? ?Provider:   ?Janina Mayo, MD { ? ? ? ?Important Information About Sugar ? ? ? ? ? ? ?

## 2021-12-14 NOTE — Progress Notes (Signed)
?Cardiology Office Note:   ? ?Date:  12/14/2021  ? ?ID:  Kelli Martinez, DOB 1945/11/14, MRN [id number] ? ?PCP:  Billie Ruddy, MD ?  ?[facility] Providers ?Cardiologist:  Janina Mayo, MD    ? ?Referring MD: Billie Ruddy, MD  ? ?No chief complaint on file. ?LE edema ? ?History of Present Illness:   ? ?Kelli Martinez is a 76 y.o. female with a hx of DM2, GERD, HTN, OSA prescribed cpap, DVT/ recurrent PE s.p IVC filter (per patient since 2011) continued on coumadin, gastric bypass surgery, breast cancer ~ 30 years ago  s/p chemo (unknown) bone marrow transplant in remission referral from Dr. Volanda Napoleon regarding palpitations, LE edema and weight gain ? ?Kelli Martinez reports that her legs have had increased swelling. She keeps her legs elevated at night. Its been about a year in a half that it has progressed. She was being seen by [facility]. She can get easily fatigued with daily activities. She sleeps with 2 pillows. She says lying more flat makes her feel bad/head hurts.  She says that walking to the kitchen and back she gets SOB.  She denies PND.  Her weight is up 3 pounds today from yesterday. No prior hx of CHF.  ? ?She saw Dr. Volanda Napoleon 07/08/2021 with concern for CHF. She was taking lasix 20 mg at night.  BNP was mildly elevated 223. Her echo is pending. Her chest xray showed no edema. Her lasix was increased from 20 mg to 40 mg daily started yesterday. She has normal renal function. She's on spironolactone takes 50 mg some days and 100 mg some days. Blood pressure is in good control. ? ?She's had report of LE edema over the summer. She was taking lasix 20 mg with this. She's had right UE edema 2/2 [facility] with hx of breast cancer. ? ?Otherwise she's had significant leg cramping, statin was reduced previously. ? ?She saw Dr. [place]ique in 2012. He notes: She has remote history of syncope with normal cardiac evaluation in the past including echocardiogram, nuclear stress testing, and  tilt table testing. She has a history of recurrent pulmonary emboli, diabetes, and hypertension. ? ?Interim Hx ?She still has significant LE edema. She sleeps on 2 pillows 2/2 being dizzy. No orthopnea. She is getting cortisone shots in her hip. She notes increased leg swelling with this. She is tracking her weights. Weights 202 to 208. She is peeing. She notes constipation. She has not been to the [facility] clinic yet but is planning to. For lasix, she just increased to 80 mg daily.  Her renal function from 08/29/2021 was normal. K was normal. ? ?Interim hx 12/14/2021 ?She was increased to lasix 80 mg BID when I saw her in Feb. No hospitalizations. Her weight at home is 197. She still sleeps on pillows. Her legs are better with elevation. She does wear compression stockings. Her blood pressure is well controlled. She drinks boost in the AM ? ?Wt Readings from Last 3 Encounters:  ?12/14/21 202 lb (91.6 kg)  ?10/11/21 206 lb 6.4 oz (93.6 kg)  ?09/16/21 213 lb (96.6 kg)  ? ? ? ?Cardiology Studies ? ?TTE 10/09/2019 ? 1. Left ventricular ejection fraction by 3D volume is 57 %. The left  ?ventricle has normal function. The left ventricle has no regional wall motion abnormalities. Left ventricular diastolic parameters were normal. The average left ventricular global  ?longitudinal strain is -23.6 % (normal).  ? 2. Right ventricular systolic function is normal.  The right ventricular size is normal. There is normal pulmonary artery systolic pressure. The estimated right ventricular systolic pressure is 67.1 mmHg.  ? 3. The mitral valve is abnormal. Mild mitral annular calcification.  ?Mitral valve leaflets demonstrate mild bowing in systole, without definite prolapse. Mild mitral valve regurgitation. No evidence of mitral stenosis.  ? 4. The aortic valve is tricuspid. Aortic valve regurgitation is trivial.  ?Mild aortic valve sclerosis is present, with no evidence of aortic valve stenosis.  ? 5. The inferior vena cava is  normal in size with greater than 50%  ?respiratory variability, suggesting right atrial pressure of 3 mmHg. ? ?TTE 08/25/2021 ?1. Left ventricular ejection fraction, by estimation, is 55 to 60%. The left ventricle has normal function. The left ventricle has no regional wall motion abnormalities. Left ventricular diastolic parameters were normal. The average left ventricular  ?global longitudinal strain is -22.9 %. The global longitudinal strain is normal.  ? 2. Right ventricular systolic function is normal. The right ventricular  ?size is normal. There is normal pulmonary artery systolic pressure. The estimated right ventricular systolic pressure is 24.5 mmHg.  ? 3. The mitral valve is abnormal. Mild mitral valve regurgitation.  ?Moderate mitral annular calcification.  ? 4. The aortic valve is tricuspid. There is moderate calcification of the aortic valve. There is moderate thickening of the aortic valve. Aortic valve regurgitation is not visualized. Aortic valve  ?sclerosis/calcification is present, without any evidence  ?of aortic stenosis.  ? 5. The inferior vena cava is normal in size with <50% respiratory  ?variability, suggesting right atrial pressure of 8 mmHg.  ? ?Comparison(s): Compared to prior study in 09/2019, there is no significant change.  ? ? ? ? ? ? ?Past Medical History:  ?Diagnosis Date  ? Allergy   ? Anemia   ? Arthritis   ? Asthma   ? Back pain, chronic   ? GETS INJECTIONS IN BACK  ? Blind right eye   ? hemorrhage  ? Blood transfusion   ? Cancer [facility])   ? breast 1994  ? Clotting disorder ([place])   ? Diabetes mellitus without complication ([place])   ? takes precose  ? Elevated liver enzymes   ? Gallstones   ? GERD (gastroesophageal reflux disease)   ? HX: breast cancer   ? Hyperlipidemia   ? Hypertension   ? Hypothyroid   ? Lymphedema of arm   ? RT  ? Morbid obesity ([place])   ? OSA (obstructive sleep apnea)   ? has not used in 2 years-lost weight  ? Osteoporosis   ? Peripheral neuropathy   ? on  gabapentin  ? PONV (postoperative nausea and vomiting)   ? Psoriasis   ? Pulmonary embolism ([place]) 1998 / 1994 /1968  ? Syncope and collapse 2011  ? due to anemia  ? ? ?Past Surgical History:  ?Procedure Laterality Date  ? ABDOMINAL HYSTERECTOMY    ? BONE MARROW TRANSPLANT  1994  ? CARDIOVASCULAR STRESS TEST  05/23/2005  ? EF 53%  ? carpal tunnell    ? bil  ? cataracts    ? CHOLECYSTECTOMY  05/10/2012  ? Procedure: LAPAROSCOPIC CHOLECYSTECTOMY WITH INTRAOPERATIVE CHOLANGIOGRAM;  Surgeon: Pedro Earls, MD;  [facility];  Service: General;  Laterality: N/A;  Laparoscopic Cholecystectomy with Intraoperative Cholangiogram  ? GASTRIC BYPASS  2011  ? bariatric surgery  ? HIATAL HERNIA REPAIR    ? IVC filter    ? recurrent DVT  ? KNEE ARTHROTOMY  1998  ?  MASS EXCISION Right 11/24/2016  ? Procedure: EXCISION RIGHT AXILLARY LESION AND CHEST WALL LESION;  Surgeon: Johnathan Hausen, MD;  Location: [place];  Service: General;  Laterality: Right;  ? MASTECTOMY    ? Right  ? US ECHOCARDIOGRAPHY  10/27/08  ? EF 55-60%  ? ? ?Current Medications: ?Current Meds  ?Medication Sig  ? acarbose (PRECOSE) 50 MG tablet 1 tablet at start of breakfast and lunch and half tablet at dinnertime  ? albuterol (PROAIR HFA) 108 (90 Base) MCG/ACT inhaler Inhale 2 puffs into the lungs every 6 (six) hours as needed for wheezing or shortness of breath.  ? baclofen (LIORESAL) 20 MG tablet TAKE 1 TABLET THREE TIMES A DAY (UPDATED PRESCRIPTION)  ? Blood Glucose Monitoring Suppl (FREESTYLE FREEDOM LITE) w/Device KIT Use as directed to check blood sugar once daily.  ? Calcium Carbonate-Vitamin D 600-400 MG-UNIT tablet Take 1 tablet by mouth daily.  ? clobetasol cream (TEMOVATE) 9.93 % APPLY 1 APPLICATION TOPICALLY TWICE A DAY  ? diazepam (DIASTAT ACUDIAL) 10 MG GEL Place 5 mg rectally once as needed (cramping).  ? ferrous sulfate 325 (65 FE) MG tablet Take 325 mg by mouth daily.  ? fluticasone (FLONASE) 50 MCG/ACT nasal spray Place 2  sprays into both nostrils daily.  ? fluticasone-salmeterol (ADVAIR DISKUS) 100-50 MCG/ACT AEPB USE 1 INHALATION EVERY 12 HOURS, RINSE MOUTH AFTER USING  ? FREESTYLE LITE test strip USE AS INSTRUCTED TO CHECK BLOOD S

## 2021-12-15 LAB — TSH: TSH: 2.33 u[IU]/mL (ref 0.450–4.500)

## 2021-12-15 LAB — COMPREHENSIVE METABOLIC PANEL
ALT: 48 IU/L — ABNORMAL HIGH (ref 0–32)
AST: 38 IU/L (ref 0–40)
Albumin/Globulin Ratio: 1.6 (ref 1.2–2.2)
Albumin: 4.5 g/dL (ref 3.7–4.7)
Alkaline Phosphatase: 103 IU/L (ref 44–121)
BUN/Creatinine Ratio: 27 (ref 12–28)
BUN: 31 mg/dL — ABNORMAL HIGH (ref 8–27)
Bilirubin Total: 0.3 mg/dL (ref 0.0–1.2)
CO2: 28 mmol/L (ref 20–29)
Calcium: 10 mg/dL (ref 8.7–10.3)
Chloride: 93 mmol/L — ABNORMAL LOW (ref 96–106)
Creatinine, Ser: 1.16 mg/dL — ABNORMAL HIGH (ref 0.57–1.00)
Globulin, Total: 2.9 g/dL (ref 1.5–4.5)
Glucose: 102 mg/dL — ABNORMAL HIGH (ref 70–99)
Potassium: 5 mmol/L (ref 3.5–5.2)
Sodium: 135 mmol/L (ref 134–144)
Total Protein: 7.4 g/dL (ref 6.0–8.5)
eGFR: 49 mL/min/{1.73_m2} — ABNORMAL LOW (ref >59)

## 2021-12-15 LAB — HEMOGLOBIN A1C
Est. average glucose Bld gHb Est-mCnc: 151 mg/dL
Hgb A1c MFr Bld: 6.9 % — ABNORMAL HIGH (ref 4.8–5.6)

## 2021-12-16 DIAGNOSIS — L84 Corns and callosities: Secondary | ICD-10-CM | POA: Diagnosis not present

## 2021-12-16 DIAGNOSIS — E1151 Type 2 diabetes mellitus with diabetic peripheral angiopathy without gangrene: Secondary | ICD-10-CM | POA: Diagnosis not present

## 2021-12-16 DIAGNOSIS — L603 Nail dystrophy: Secondary | ICD-10-CM | POA: Diagnosis not present

## 2021-12-16 DIAGNOSIS — I739 Peripheral vascular disease, unspecified: Secondary | ICD-10-CM | POA: Diagnosis not present

## 2021-12-22 ENCOUNTER — Ambulatory Visit: Payer: Medicare Other | Admitting: Family Medicine

## 2021-12-28 ENCOUNTER — Ambulatory Visit: Payer: Medicare Other

## 2021-12-28 DIAGNOSIS — I972 Postmastectomy lymphedema syndrome: Secondary | ICD-10-CM

## 2021-12-28 DIAGNOSIS — I890 Lymphedema, not elsewhere classified: Secondary | ICD-10-CM

## 2021-12-28 NOTE — Therapy (Signed)
OUTPATIENT PHYSICAL THERAPY TREATMENT NOTE   Patient Name: Kelli Martinez MRN: [id number] DOB:Mar 09, 1946, 76 y.o., female Today's Date: 12/28/2021  PCP: Billie Ruddy, MD REFERRING PROVIDER: Billie Ruddy, MD   PT End of Session - 12/02/21 [phone]     Visit Number 19    Number of Visits 21    Date for PT Re-Evaluation 12/30/21    PT Start Time 0907    PT Stop Time 0950    PT Time Calculation (min) 43 min    Activity Tolerance Patient tolerated treatment well    Behavior During Therapy [facility] for tasks assessed/performed               Past Medical History:  Diagnosis Date   Allergy    Anemia    Arthritis    Asthma    Back pain, chronic    GETS INJECTIONS IN BACK   Blind right eye    hemorrhage   Blood transfusion    Cancer ([place])    breast 1994   Clotting disorder ([place])    Diabetes mellitus without complication ([place])    takes precose   Elevated liver enzymes    Gallstones    GERD (gastroesophageal reflux disease)    HX: breast cancer    Hyperlipidemia    Hypertension    Hypothyroid    Lymphedema of arm    RT   Morbid obesity ([place])    OSA (obstructive sleep apnea)    has not used in 2 years-lost weight   Osteoporosis    Peripheral neuropathy    on gabapentin   PONV (postoperative nausea and vomiting)    Psoriasis    Pulmonary embolism ([place]) 1998 / 1994 /1968   Syncope and collapse 2011   due to anemia   Past Surgical History:  Procedure Laterality Date   ABDOMINAL HYSTERECTOMY     BONE MARROW TRANSPLANT  1994   CARDIOVASCULAR STRESS TEST  05/23/2005   EF 53%   carpal tunnell     bil   cataracts     CHOLECYSTECTOMY  05/10/2012   Procedure: LAPAROSCOPIC CHOLECYSTECTOMY WITH INTRAOPERATIVE CHOLANGIOGRAM;  Surgeon: Pedro Earls, MD;  Location: [facility];  Service: General;  Laterality: N/A;  Laparoscopic Cholecystectomy with Intraoperative Cholangiogram   GASTRIC BYPASS  2011   bariatric surgery   HIATAL HERNIA REPAIR     IVC filter      recurrent DVT   KNEE ARTHROTOMY  1998   MASS EXCISION Right 11/24/2016   Procedure: EXCISION RIGHT AXILLARY LESION AND CHEST WALL LESION;  Surgeon: Johnathan Hausen, MD;  Location: [facility];  Service: General;  Laterality: Right;   MASTECTOMY     Right   US ECHOCARDIOGRAPHY  10/27/08   EF 55-60%   Patient Active Problem List   Diagnosis Date Noted   Hypoglycemia following gastrointestinal surgery 03/19/2018   Edema of both lower extremities due to peripheral venous insufficiency 11/20/2017   Maxillary sinusitis, acute 08/31/2017   Encounter for therapeutic drug monitoring 08/26/2013   Insomnia 05/01/2013   Long term (current) use of anticoagulants 04/15/2013   Postsurgical dumping syndrome 04/06/2013   Bilateral leg edema 04/06/2013   Reactive hypoglycemia 04/02/2013   Abdominal pain intermittent-evalulating for internal hernia 11/07/2012   S/P laparoscopic cholecystectomy 05/31/2012   Roux Y Gastric Bypass April 2011; repair [facility] 11/22/2011   Unspecified deficiency anemia 11/14/2011   Left leg paresthesias 10/27/2010   Hypothyroid 10/27/2010   Low back pain 10/27/2010   Obstructive  sleep apnea 09/29/2007   PULMONARY EMBOLISM 09/25/2007   Seasonal and perennial allergic rhinitis 09/25/2007   Allergic asthma, mild persistent, uncomplicated 65/78/4696   BREAST CANCER, HX OF 09/25/2007    REFERRING DIAG: Rt UE lymphedema  THERAPY DIAG:  Post-mastectomy lymphedema syndrome  Postmastectomy lymphedema  Secondary lymphedema  PERTINENT HISTORY: PERTINENT HISTORY: Pt had right breast mastectomy in 1994. She had 10/13 LN's that were malignant. She had chemo and radiation as well as a bone marrow transplant in 1994. Went to a lymphedema clinic in [place], and had treatment here as well many years ago. Had a sleeve from 5 years ago but it is too tight. She takes Diurtetics and in the morning her legs are down a lot. Sometimes she feels swelling all over. Heart testing  was OK  PRECAUTIONS: Rt lympehdema  SUBJECTIVE:  I have been wearing the sleeve and glove but the sleeve seems to slide down during the day and I have to keep pulling it up. I wash it 1x per week.  I love the night garment and I wear it nightly. When I get ready for bed I do the MLD.Sometimes I wear it during the day because it feels good. I bought some stockings but the left one really hurts at the top border.   PAIN:  Are you having pain? No,    LYMPHEDEMA ASSESSMENTS:    [facility] RIGHT  09/27/2021 Right 10/17/2021 Right 10/21/21 10/26/2021 11/04/21 11/09/21 11/22/2021 11/24/2021 12/28/2021  10 cm proximal to olecranon process 40.8 39.7 37.5 38.9 38 36.8 39.8  38.5  Olecranon process 29.5 28.5 28.1 28.2 29 26.7 28.8 28.3 27.5  10 cm proximal to ulnar styloid process 31.9 30.0 28.4 28.5 27 27.9 29.3 27.7 27.5  Just proximal to ulnar styloid process 18.4 17.6 16.7 17.3 16.3 16.4 17.4  16.6  Across hand at thumb web space 19.5 19.6 18.6 19.5 19 18.6 19.2  19.0  At base of 2nd digit 6.0 5.7 5.6 5.7 5.8 5.7 5.8  5.7  (Blank rows = not tested)     TODAY'S TREATMENT  12/28/2021 Measured pt with excellent results. Discussed laundering of garments more frequently to prevent sliding.  In supine: Short neck, 5 diaphragmatic breaths, bil axillary nodes and establishment of anterior inter-axillary pathway, R inguinal nodes and establishment of Rt axillo-inguinal pathway, then R UE working proximal to distal, moving inner upper arm outwards and upwards, and doing both sides of forearms, spending extra time in any areas of fibrosis then retracing all steps and educating pt as I was performing   Showed pt donning gloves on [place] and wrote down information for pts son to order. Assisted pt donning sleeve especially in the back to tuck in soft tissue. She is awaiting her flexi touch, and Jinny Blossom is awaiting MD signature   12/02/2021 Pt brougt in her new daytime sleeve and glove.  She was shown how to don with  jobst gloves and using table to stabilize arm. Some difficulty initially getting it high enough because it was down too low at her wrist.  She was able to don the glove without difficulty.  She liked the fit of all and is considering getting a second set of a different color but was advised to wear this set for a short while before ordering another set.  We discussed laundering and I showed pt on [employer] and [employer] how she can order donning gloves, and another sleeve/glove if she likes.  She prefers at this time to go  through [place].  I showed her several donning options but she felt she could don independently and chose not to try either device.  We will set up an appt in 2-3 weeks to reassess her arm and discharge.  11/29/2021 Pts wrap removed and technique assessed. For the most part well done , but had fingers connected to one another with finger wrap and had gone under hand Showed pt how to don profile night garment and oversleeve.  Garment was comfortable and a good fit. In supine: Short neck, 5 diaphragmatic breaths, bil axillary nodes and establishment of anterior inter-axillary pathway, R inguinal nodes and establishment of Rt axillo-inguinal pathway, then R UE working proximal to distal, moving inner upper arm outwards and upwards, and doing both sides of forearms, spending extra time in any areas of fibrosis then retracing all steps and educating pt as I was performing   Compression bandaging to Rt UE, tg soft size medium, Elastomull fingers (fixated with paper tape) 1-4 ,  Rosidal soft from wrist to axilla, then 1 6cm hand and forearm , 8 cm  bandage right forearm,, 1 10cm (herring bone here), elbow to axilla, and 1 12 short stretch compression bandage applied from wrist to axilla.  11/24/2021 Wraps removed and pt remeasured In supine: Short neck, 5 diaphragmatic breaths, bil axillary nodes and establishment of anterior inter-axillary pathway, R inguinal nodes and establishment of Rt  axillo-inguinal pathway, then R UE working proximal to distal, moving inner upper arm outwards and upwards, and doing both sides of forearms, spending extra time in any areas of fibrosis then retracing all steps and educating pt as I was performing   Compression bandaging to Rt UE, tg soft size medium, Elastomull fingers (fixated with paper tape) 1-4 ,  Rosidal soft from wrist to axilla, then 1 6cm hand and forearm , 8 cm  bandage right forearm,, 1 10cm (herring bone here), elbow to axilla, and 1 12 short stretch compression bandage applied from wrist to axilla. 11/22/2021 Emailed Lis new colors requested for compression sleeve and gauntlet. Remeasured pts arm with increases throughout and discussed the need for her to stay in bandages as much as possible until her new sleeve comes In supine: Short neck, 5 diaphragmatic breaths, bil axillary nodes and establishment of anterior inter-axillary pathway, R inguinal nodes and establishment of Rt axillo-inguinal pathway, then R UE working proximal to distal, moving inner upper arm outwards and upwards, and doing both sides of forearms, spending extra time in any areas of fibrosis then retracing all steps and educating pt as I was performing   Compression bandaging to Rt UE, tg soft size medium, Elastomull fingers (fixated with paper tape) 1-4 ,  Rosidal soft from wrist to axilla, then 1 6cm hand and forearm , 8 cm  bandage right forearm,, 1 10cm (herring bone here), elbow to axilla, and 1 12 short stretch compression bandage applied from wrist to axilla.     11/17/2021 Discussed garments that were being ordered for pt. She would like a ready made for a glove and the Magenta power sleeve for the Profile.  Pt had multiple questions about compression stockings and these were also addressed. In supine: Short neck, 5 diaphragmatic breaths, bil axillary nodes and establishment of anterior inter-axillary pathway, R inguinal nodes and establishment of Rt axillo-inguinal  pathway, then R UE working proximal to distal, moving inner upper arm outwards and upwards, and doing both sides of forearms, spending extra time in any areas of fibrosis then retracing all  steps and educating pt as I was performing   Compression bandaging to Rt UE, tg soft size medium, Elastomull fingers (fixated with paper tape) 1-4 ,  Rosidal soft from wrist to axilla, then 1 6cm hand and forearm , 8 cm  bandage right forearm,, 1 10cm (herring bone here), elbow to axilla, and 1 12 short stretch compression bandage applied from wrist to axilla.  PATIENT EDUCATION:  10/21/21: Education details: Self MLD Person educated: Patient Education method: Consulting civil engineer, Demo, Handout Education comprehension: verbalized understanding and returned demo     HOME EXERCISE PROGRAM: Self MLD, see below   ASSESSMENT:   CLINICAL IMPRESSION: Pt has achieved all goals established for lymphedema. She has maintained excellent reduction and is independent with day and night garments and MLD for her right UE.  She is still awaiting her flexi touch and she was advised to contact me if she has questions.  There are no further PT needs identified. She is discharged  OBJECTIVE IMPAIRMENTS decreased knowledge of condition, decreased ROM, increased edema, postural dysfunction, and pain.       GOALS: LONG TERM GOALS:    LTG Name Target Date Goal status  1 Pt will be independent with compression bandaging to reduce UE lymphedema Baseline: Pt performs this prn at home - 11/09/21 11/08/2021 Goal met  2 Pt will have reduction at 10 cm prox to ulna by 4-6 cm to reduce heaviness of UE Baseline: 27.9 cm (4 cm reduction) - 11/09/21 11/08/2021 Goal met  3 Pt will be independent in self right UE MLD to reduce swelling Baseline: Pt performs this daily - 11/09/21 11/08/2021 Goal met  4 Pt will be fit for compression garments and will be able to don independently Baseline: Has been fit but not yet received - 10/26/21 11/08/2021 goal met   5  Pt will maintain edema independently with compression garments 12/02/2021  Goal met   6        7          PLAN: PT FREQUENCY: 1x/week, for 1-2 visits only prn   PT DURATION:  4 weeks   PLANNED INTERVENTIONS: Therapeutic exercises, Patient/Family education, Orthotic/Fit training, Manual lymph drainage, Compression bandaging, Manual therapy, and Neuro Muscular re-education. Send demo for [employer] FOR NEXT SESSION:   RE-measure, reassess sleeve and glove and pts independence   PHYSICAL THERAPY DISCHARGE SUMMARY  Visits from Start of Care: 19  Current functional level related to goals / functional outcomes: Achieved all goals   Remaining deficits: Some swelling remains but pt is controlling independently   Education / Equipment: Compression sleeve, Glove, awaiting Flexitouch   Patient agrees to discharge. Patient goals were met. Patient is being discharged due to meeting the stated rehab goals.     Claris Pong, PT 12/28/2021, 9:55 AM   Start with circles near neck, placing hands above collarbones. 5 times each side.   Cancer Rehab [phone]  Deep Effective Breath   Standing, sitting, or laying down, place both hands on the belly. Take a deep breath IN, expanding the belly; then breath OUT, contracting the belly. Repeat __5__ times. Do __2-3__ sessions per day and before your self massage.  Axilla to Axilla - Sweep   On uninvolved side make 5 circles in the armpit, then pump _5__ times from involved armpit across chest to uninvolved armpit, making a pathway. Do _1__ time per day.  Axilla to Inguinal Nodes - Sweep   On involved side, make 5 circles at groin  at panty line, then pump _5__ times from armpit along side of trunk to outer hip, making your other pathway. Do __1_ time per day.  Arm Posterior: Elbow to Shoulder - Sweep   Pump _5__ times from back of elbow to top of shoulder. Then inner to outer upper arm _5_ times, then outer arm again _5_  times. Then back to the pathways _2-3_ times. Do _1__ time per day.  ARM: [place]olar Wrist to Elbow - Sweep   Pump or stationary circles _5__ times from wrist to elbow making sure to do both sides of the forearm. Then retrace your steps to the outer arm, and the pathways _2-3_ times each. Do _1__ time per day.  ARM: Dorsum of Hand to Shoulder - Sweep   Pump or stationary circles _5__ times on back of hand including knuckle spaces and individual fingers if needed working up towards the wrist, then retrace all your steps working back up the forearm, doing both sides; upper outer arm and back to your pathways _2-3_ times each. Then do 5 circles again at uninvolved armpit and involved groin where you started! Good job!! Do __1_ time per day.

## 2021-12-30 ENCOUNTER — Ambulatory Visit (INDEPENDENT_AMBULATORY_CARE_PROVIDER_SITE_OTHER): Payer: Medicare Other

## 2021-12-30 DIAGNOSIS — Z7901 Long term (current) use of anticoagulants: Secondary | ICD-10-CM | POA: Diagnosis not present

## 2021-12-30 LAB — POCT INR: INR: 2 (ref 2.0–3.0)

## 2021-12-30 NOTE — Progress Notes (Signed)
Continue to take 2 tablets daily except 1 tablet on Wed and Saturdays.  Re-check in 5 weeks.

## 2021-12-30 NOTE — Patient Instructions (Addendum)
Pre visit review using our clinic review tool, if applicable. No additional management support is needed unless otherwise documented below in the visit note.  Continue to take 2 tablets daily except 1 tablet on Wed and Saturdays.  Re-check in 5 weeks.

## 2022-01-02 ENCOUNTER — Other Ambulatory Visit: Payer: Self-pay | Admitting: Family Medicine

## 2022-01-02 DIAGNOSIS — E782 Mixed hyperlipidemia: Secondary | ICD-10-CM

## 2022-01-03 ENCOUNTER — Other Ambulatory Visit (INDEPENDENT_AMBULATORY_CARE_PROVIDER_SITE_OTHER): Payer: Medicare Other

## 2022-01-03 DIAGNOSIS — E1142 Type 2 diabetes mellitus with diabetic polyneuropathy: Secondary | ICD-10-CM

## 2022-01-03 DIAGNOSIS — E063 Autoimmune thyroiditis: Secondary | ICD-10-CM

## 2022-01-03 LAB — COMPREHENSIVE METABOLIC PANEL
ALT: 52 U/L — ABNORMAL HIGH (ref 0–35)
AST: 69 U/L — ABNORMAL HIGH (ref 0–37)
Albumin: 4.1 g/dL (ref 3.5–5.2)
Alkaline Phosphatase: 89 U/L (ref 39–117)
BUN: 36 mg/dL — ABNORMAL HIGH (ref 6–23)
CO2: 33 mEq/L — ABNORMAL HIGH (ref 19–32)
Calcium: 10.2 mg/dL (ref 8.4–10.5)
Chloride: 94 mEq/L — ABNORMAL LOW (ref 96–112)
Creatinine, Ser: 1.26 mg/dL — ABNORMAL HIGH (ref 0.40–1.20)
GFR: 42.19 mL/min — ABNORMAL LOW (ref >60.00)
Glucose, Bld: 113 mg/dL — ABNORMAL HIGH (ref 70–99)
Potassium: 4.3 mEq/L (ref 3.5–5.1)
Sodium: 135 mEq/L (ref 135–145)
Total Bilirubin: 0.5 mg/dL (ref 0.2–1.2)
Total Protein: 7.5 g/dL (ref 6.0–8.3)

## 2022-01-03 LAB — TSH: TSH: 5.34 u[IU]/mL (ref 0.35–5.50)

## 2022-01-03 LAB — HEMOGLOBIN A1C: Hgb A1c MFr Bld: 7.1 % — ABNORMAL HIGH (ref 4.6–6.5)

## 2022-01-05 ENCOUNTER — Encounter: Payer: Self-pay | Admitting: Endocrinology

## 2022-01-05 ENCOUNTER — Ambulatory Visit (INDEPENDENT_AMBULATORY_CARE_PROVIDER_SITE_OTHER): Payer: Medicare Other | Admitting: Endocrinology

## 2022-01-05 VITALS — BP 112/68 | HR 78 | Ht 67.5 in | Wt 201.0 lb

## 2022-01-05 DIAGNOSIS — R945 Abnormal results of liver function studies: Secondary | ICD-10-CM

## 2022-01-05 DIAGNOSIS — E7800 Pure hypercholesterolemia, unspecified: Secondary | ICD-10-CM

## 2022-01-05 DIAGNOSIS — E1142 Type 2 diabetes mellitus with diabetic polyneuropathy: Secondary | ICD-10-CM

## 2022-01-05 DIAGNOSIS — E063 Autoimmune thyroiditis: Secondary | ICD-10-CM

## 2022-01-05 DIAGNOSIS — K912 Postsurgical malabsorption, not elsewhere classified: Secondary | ICD-10-CM | POA: Diagnosis not present

## 2022-01-05 NOTE — Patient Instructions (Addendum)
No calcium with thyroid  Take 1/2 Lasix 2x daily  Acarbose 1 pill at each pill

## 2022-01-05 NOTE — Progress Notes (Signed)
Patient ID: Kelli Martinez, female   DOB: 05/21/1946, 76 y.o.   MRN: [id number]    Chief complaint: Followup of various chronic problems    History of Present Illness:  She is here for follow-up:  1.  Postprandial hypoglycemia related to dumping syndrome   She has had postprandial hypoglycemia starting a couple of years after her gastric bypass surgery Her symptoms of blood sugars are mostly a feeling of significant weakness, some shakiness.  She will use glucose tablets or juice in order to relieve the symptoms, recently she thinks that 1 or 2 glucose tablets are usually adequate She has been to the dietitian and has been instructed on balanced low-fat meals with enough protein consistently and restricting carbohydrates including fruits   Recent history:  She has been treated with acarbose 50 mg before meals She did try verapamil previously without benefit  She is taking her Precose as directed before meals, taking 50 mg before breakfast, 50 mg tablet at lunch and 50 mg every other day at dinnertime  She did not take half tablet at dinnertime because the tablet gets crushed  She now has had only rare low blood sugars but will feel symptomatic when the blood sugars are in the 80s  She has no documented low blood sugars recently  Also she was told to add a boiled egg to her yogurt in the morning to get more protein Sometimes will have boost in the morning Fasting readings are fairly good although only once higher at 131 Has only rare blood sugars later in the day with highest 135 No overnight hypoglycemia  Metformin was stopped previously because of hypoglycemia tendency   2.  SWELLING of the legs:  She has had significant problems with her legs and feet chronically She has been followed by PCP and by cardiologist  Taking Aldactone 100 mg daily  Also taking 160  mg Lasix daily; the dose was increased by the cardiologist but she is not complaining of  cramping now Weight appears to be down  Usually she does not like to wear compression stockings because of discomfort and difficulty putting them on  Renal function and potassium :  Lab Results  Component Value Date   K 4.3 01/03/2022   Lab Results  Component Value Date   CREATININE 1.26 (H) 01/03/2022   CREATININE 1.16 (H) 12/14/2021   CREATININE 1.13 08/29/2021     PROBLEM 3:  DIABETES:  This has been mild and well controlled since her gastric bypass surgery  The A1c usually higher than expected for her blood sugars, now 7.1  Current management, blood sugar patterns: She has only been on acarbose for treatment to prevent hypoglycemia  Blood sugar readings are discussed above and no significantly high readings present although has only 1 or 2 readings below 100 in the morning She is usually trying to work on keeping her diet healthy Has not been able to exercise much  She was previously told to stop her Metformin  because of tendency to hypoglycemia symptoms  Glucose readings from monitor download as above, recent AVERAGE 105  Previously:  PRE-MEAL Fasting Lunch Dinner Bedtime Overall  Glucose range: 88-99 64, 68 77, 82 48   Mean/median:     90   POST-MEAL PC Breakfast PC Lunch PC Dinner  Glucose range:  153 115  Mean/median:         Wt Readings from Last 3 Encounters:  01/05/22 201 lb (91.2 kg)  12/14/21 202 lb (  91.6 kg)  10/11/21 206 lb 6.4 oz (93.6 kg)    Lab Results  Component Value Date   HGBA1C 7.1 (H) 01/03/2022   HGBA1C 6.9 (H) 12/14/2021   HGBA1C 6.6 (H) 08/29/2021   Lab Results  Component Value Date   MICROALBUR <0.7 04/26/2021   LDLCALC 90 08/29/2021   CREATININE 1.26 (H) 01/03/2022   Lab Results  Component Value Date   HGB 12.1 07/08/2021      Other active problems: see review of systems     Allergies as of 01/05/2022       Reactions   Adhesive [tape] Rash        Medication List        Accurate as of January 05, 2022  11:59 PM. If you have any questions, ask your nurse or doctor.          acarbose 50 MG tablet Commonly known as: PRECOSE 1 tablet at start of breakfast and lunch and half tablet at dinnertime   albuterol 108 (90 Base) MCG/ACT inhaler Commonly known as: ProAir HFA Inhale 2 puffs into the lungs every 6 (six) hours as needed for wheezing or shortness of breath.   baclofen 20 MG tablet Commonly known as: LIORESAL TAKE 1 TABLET THREE TIMES A DAY (UPDATED PRESCRIPTION)   Calcium Carbonate-Vitamin D 600-400 MG-UNIT tablet Take 1 tablet by mouth daily.   clobetasol cream 0.05 % Commonly known as: TEMOVATE APPLY 1 APPLICATION TOPICALLY TWICE A DAY   diazepam 10 MG Gel Commonly known as: DIASTAT ACUDIAL Place 5 mg rectally once as needed (cramping).   ferrous sulfate 325 (65 FE) MG tablet Take 325 mg by mouth daily.   fluticasone 50 MCG/ACT nasal spray Commonly known as: FLONASE Place 2 sprays into both nostrils daily.   fluticasone-salmeterol 100-50 MCG/ACT Aepb Commonly known as: Advair Diskus USE 1 INHALATION EVERY 12 HOURS, RINSE MOUTH AFTER USING   FreeStyle Freedom Lite w/Device Kit Use as directed to check blood sugar once daily.   freestyle lancets USE AS INSTRUCTED TO CHECK BLOOD SUGAR ONCE DAILY   FREESTYLE LITE test strip Generic drug: glucose blood USE AS INSTRUCTED TO CHECK BLOOD SUGAR ONCE DAILY   furosemide 80 MG tablet Commonly known as: LASIX Take 1 tablet (80 mg total) by mouth 2 (two) times daily.   gabapentin 600 MG tablet Commonly known as: NEURONTIN TAKE 1 TABLET THREE TIMES A DAY (DOSE CHANGE)   HYDROcodone-acetaminophen 5-325 MG tablet Commonly known as: Norco Take 1 tablet by mouth 2 (two) times daily as needed for moderate pain. What changed:  how much to take when to take this reasons to take this   levothyroxine 100 MCG tablet Commonly known as: SYNTHROID TAKE 1 TABLET DAILY BEFORE BREAKFAST (NEED WHEN 88 MCG OUT)    methylcellulose 1 % ophthalmic solution Commonly known as: ARTIFICIAL TEARS Place 1 drop into both eyes as needed. Dry eyes   omeprazole 20 MG capsule Commonly known as: PRILOSEC Take 1 capsule (20 mg total) by mouth daily.   potassium chloride SA 20 MEQ tablet Commonly known as: KLOR-CON M Take 2 tablets (40 mEq total) by mouth 2 (two) times daily.   pravastatin 10 MG tablet Commonly known as: PRAVACHOL TAKE 1 TABLET DAILY   SLOW-MAG PO Take 500 mg by mouth at bedtime.   spironolactone 100 MG tablet Commonly known as: ALDACTONE Take 1 tablet (100 mg total) by mouth daily.   temazepam 15 MG capsule Commonly known as: RESTORIL 1-2 for sleep as needed  vitamin B-12 500 MCG tablet Commonly known as: CYANOCOBALAMIN Take 500 mcg by mouth daily.   warfarin 5 MG tablet Commonly known as: COUMADIN Take as directed by the anticoagulation clinic. If you are unsure how to take this medication, talk to your nurse or doctor. Original instructions: TAKE 2 TABLETS DAILY EXCEPT TAKE 1 TABLET ON WEDNESDAY OR TAKE AS DIRECTED BY ANTICOAGULATION CLINIC        Allergies:  Allergies  Allergen Reactions   Adhesive [Tape] Rash    Past Medical History:  Diagnosis Date   Allergy    Anemia    Arthritis    Asthma    Back pain, chronic    GETS INJECTIONS IN BACK   Blind right eye    hemorrhage   Blood transfusion    Cancer ([place])    breast 1994   Clotting disorder ([place])    Diabetes mellitus without complication ([place])    takes precose   Elevated liver enzymes    Gallstones    GERD (gastroesophageal reflux disease)    HX: breast cancer    Hyperlipidemia    Hypertension    Hypothyroid    Lymphedema of arm    RT   Morbid obesity ([facility])    OSA (obstructive sleep apnea)    has not used in 2 years-lost weight   Osteoporosis    Peripheral neuropathy    on gabapentin   PONV (postoperative nausea and vomiting)    Psoriasis    Pulmonary embolism ([place]) 1998 / 1994 /1968    Syncope and collapse 2011   due to anemia    Past Surgical History:  Procedure Laterality Date   ABDOMINAL HYSTERECTOMY     BONE MARROW TRANSPLANT  1994   CARDIOVASCULAR STRESS TEST  05/23/2005   EF 53%   carpal tunnell     bil   cataracts     CHOLECYSTECTOMY  05/10/2012   Procedure: LAPAROSCOPIC CHOLECYSTECTOMY WITH INTRAOPERATIVE CHOLANGIOGRAM;  Surgeon: Pedro Earls, MD;  Location: [facility];  Service: General;  Laterality: N/A;  Laparoscopic Cholecystectomy with Intraoperative Cholangiogram   GASTRIC BYPASS  2011   bariatric surgery   HIATAL HERNIA REPAIR     IVC filter     recurrent DVT   KNEE ARTHROTOMY  1998   MASS EXCISION Right 11/24/2016   Procedure: EXCISION RIGHT AXILLARY LESION AND CHEST WALL LESION;  Surgeon: Johnathan Hausen, MD;  Location: [facility];  Service: General;  Laterality: Right;   MASTECTOMY     Right   US ECHOCARDIOGRAPHY  10/27/08   EF 55-60%    Family History  Problem Relation Age of Onset   Sudden death Mother        car accident   Cancer Father 47       lung   Cancer Sister 30       lung cancer   Asthma Daughter     Social History:  reports that she quit smoking about 43 years ago. Her smoking use included cigarettes. She has a 5.00 pack-year smoking history. She has never used smokeless tobacco. She reports that she does not drink alcohol and does not use drugs.  Review of Systems    ABNORMAL liver functions:  ALT is high again  She has had abnormal high ALT readings before transiently and no etiology found Not related to statins No recent abdominal imaging available  Lab Results  Component Value Date   ALT 52 (H) 01/03/2022   ALT 25 01/19/2016  Neuropathy with persistent burning, mild numbness and lower leg pain:  She gets relief with nortriptyline and gabapentin  MUSCLE cramps/spasms:  These are treated with 10 mg baclofen during the day twice daily and 20 mg at bedtime She also uses tonic water, mustard  and magnesium supplements  No history of hypokalemia  History of mild hypothyroidism: Has been present for several years and minimally symptomatic usually  Current dose of levothyroxine is 100 mcg However now appears that she is taking her calcium at the same time as of late paroxysmal before breakfast  She tends to have fatigue chronically TSH history:   Lab Results  Component Value Date   TSH 5.34 01/03/2022   TSH 2.330 12/14/2021   TSH 2.23 08/29/2021   FREET4 1.05 07/08/2021   FREET4 0.92 01/21/2021   FREET4 0.83 07/19/2020     Hypercholesterolemia: LDL has been at target with pravastatin 54m  Labs as below  Lipid levels:  Lab Results  Component Value Date   CHOL 193 08/29/2021   HDL 92.70 08/29/2021   LDLCALC 90 08/29/2021   TRIG 52.0 08/29/2021   CHOLHDL 2 08/29/2021   Lab Results  Component Value Date   ALT 52 (H) 01/03/2022   ALT 25 01/19/2016    No history of hypertension  BP Readings from Last 3 Encounters:  01/05/22 112/68  12/14/21 126/70  09/16/21 138/76     LABS:  Lab on 01/03/2022  Component Date Value Ref Range Status   TSH 01/03/2022 5.34  0.35 - 5.50 uIU/mL Final   Sodium 01/03/2022 135  135 - 145 mEq/L Final   Potassium 01/03/2022 4.3  3.5 - 5.1 mEq/L Final   Chloride 01/03/2022 94 (L)  96 - 112 mEq/L Final   CO2 01/03/2022 33 (H)  19 - 32 mEq/L Final   Glucose, Bld 01/03/2022 113 (H)  70 - 99 mg/dL Final   BUN 01/03/2022 36 (H)  6 - 23 mg/dL Final   Creatinine, Ser 01/03/2022 1.26 (H)  0.40 - 1.20 mg/dL Final   Total Bilirubin 01/03/2022 0.5  0.2 - 1.2 mg/dL Final   Alkaline Phosphatase 01/03/2022 89  39 - 117 U/L Final   AST 01/03/2022 69 (H)  0 - 37 U/L Final   ALT 01/03/2022 52 (H)  0 - 35 U/L Final   Total Protein 01/03/2022 7.5  6.0 - 8.3 g/dL Final   Albumin 01/03/2022 4.1  3.5 - 5.2 g/dL Final   GFR 01/03/2022 42.19 (L)  >60.00 mL/min Final   Calculated using the CKD-EPI Creatinine Equation (2021)   Calcium 01/03/2022  10.2  8.4 - 10.5 mg/dL Final   Hgb A1c MFr Bld 01/03/2022 7.1 (H)  4.6 - 6.5 % Final   Glycemic Control Guidelines for People with Diabetes:Non Diabetic:  <6%Goal of Therapy: <7%Additional Action Suggested:  >8%   Anti-coag visit on 12/30/2021  Component Date Value Ref Range Status   INR 12/30/2021 2.0  2.0 - 3.0 Final  Office Visit on 12/14/2021  Component Date Value Ref Range Status   Glucose 12/14/2021 102 (H)  70 - 99 mg/dL Final   BUN 12/14/2021 31 (H)  8 - 27 mg/dL Final   Creatinine, Ser 12/14/2021 1.16 (H)  0.57 - 1.00 mg/dL Final   eGFR 12/14/2021 49 (L)  >59 mL/min/1.73 Final   BUN/Creatinine Ratio 12/14/2021 27  12 - 28 Final   Sodium 12/14/2021 135  134 - 144 mmol/L Final   Potassium 12/14/2021 5.0  3.5 - 5.2 mmol/L Final  Chloride 12/14/2021 93 (L)  96 - 106 mmol/L Final   CO2 12/14/2021 28  20 - 29 mmol/L Final   Calcium 12/14/2021 10.0  8.7 - 10.3 mg/dL Final   Total Protein 12/14/2021 7.4  6.0 - 8.5 g/dL Final   Albumin 12/14/2021 4.5  3.7 - 4.7 g/dL Final   Globulin, Total 12/14/2021 2.9  1.5 - 4.5 g/dL Final   Albumin/Globulin Ratio 12/14/2021 1.6  1.2 - 2.2 Final   Bilirubin Total 12/14/2021 0.3  0.0 - 1.2 mg/dL Final   Alkaline Phosphatase 12/14/2021 103  44 - 121 IU/L Final   AST 12/14/2021 38  0 - 40 IU/L Final   ALT 12/14/2021 48 (H)  0 - 32 IU/L Final   TSH 12/14/2021 2.330  0.450 - 4.500 uIU/mL Final   Hgb A1c MFr Bld 12/14/2021 6.9 (H)  4.8 - 5.6 % Final   Comment:          Prediabetes: 5.7 - 6.4          Diabetes: >6.4          Glycemic control for adults with diabetes: <7.0    Est. average glucose Bld gHb Est-m* 12/14/2021 151  mg/dL Final     EXAM:  BP 112/68 (Patient Position: Standing)   Pulse 78   Ht 5' 7.5" (1.715 m)   Wt 201 lb (91.2 kg)   SpO2 93%   BMI 31.02 kg/m   Multiple plates of the legs present, more on the left   Assessment/Plan:   1.  HYPOTHYROIDISM: TSH is high normal  She will stop taking her calcium at the same time  to reduce any interaction   2. Reactive hypoglycemia secondary to gastrointestinal surgery:   She is still getting periodic hypoglycemia and may be symptomatic, again more when she is late for lunch Discussed needing to not go too long between meals, add more protein to breakfast and at least have a small snack in the evenings before bedtime if not eating much at dinnertime Continue acarbose with each meal as before  Rx  She needs to make sure that she taking the acarbose at the start of the meal/  3. Mild diabetes: Not on treatment  As before blood sugars are generally near normal with highest reading only 135 A1c higher than expected since he is not fasting blood sugars are only rarely above 125  4.  Increased ALT: This is variable and may be related to fatty liver however for optimal evaluation and management will refer to gastroenterologist  5.  Chronic venous edema: Now followed by cardiologist: Currently taking large doses of Lasix from the cardiologist and standing blood pressure is low normal with rising creatinine, recommended temporarily decreasing the Lasix to 40 mg twice daily as she is also complaining of diuresis  6.:  Lipids well controlled with LDL 90  Patient Instructions  No calcium with thyroid  Take 1/2 Lasix 2x daily  Acarbose 1 pill at each pill    Elayne Snare 01/06/2022, 3:04 PM

## 2022-01-06 ENCOUNTER — Encounter: Payer: Self-pay | Admitting: Endocrinology

## 2022-01-09 ENCOUNTER — Encounter: Payer: Self-pay | Admitting: Internal Medicine

## 2022-01-12 ENCOUNTER — Encounter: Payer: Self-pay | Admitting: Internal Medicine

## 2022-01-17 DIAGNOSIS — M5416 Radiculopathy, lumbar region: Secondary | ICD-10-CM | POA: Diagnosis not present

## 2022-02-03 ENCOUNTER — Ambulatory Visit (INDEPENDENT_AMBULATORY_CARE_PROVIDER_SITE_OTHER): Payer: Medicare Other

## 2022-02-03 DIAGNOSIS — Z7901 Long term (current) use of anticoagulants: Secondary | ICD-10-CM | POA: Diagnosis not present

## 2022-02-03 LAB — POCT INR: INR: 3 (ref 2.0–3.0)

## 2022-02-03 NOTE — Patient Instructions (Addendum)
Pre visit review using our clinic review tool, if applicable. No additional management support is needed unless otherwise documented below in the visit note.  Continue to take 2 tablets daily except 1 tablet on Wed and Saturdays.  Re-check in 4 weeks.

## 2022-02-03 NOTE — Progress Notes (Signed)
Continue to take 2 tablets daily except 1 tablet on Wed and Saturdays.  Re-check in 4 weeks.

## 2022-02-14 ENCOUNTER — Encounter: Payer: Self-pay | Admitting: Internal Medicine

## 2022-02-14 ENCOUNTER — Telehealth: Payer: Self-pay

## 2022-02-14 ENCOUNTER — Other Ambulatory Visit (INDEPENDENT_AMBULATORY_CARE_PROVIDER_SITE_OTHER): Payer: Medicare Other

## 2022-02-14 ENCOUNTER — Ambulatory Visit (INDEPENDENT_AMBULATORY_CARE_PROVIDER_SITE_OTHER): Payer: Medicare Other | Admitting: Internal Medicine

## 2022-02-14 VITALS — BP 124/78 | HR 66 | Ht 67.0 in | Wt 205.0 lb

## 2022-02-14 DIAGNOSIS — R1310 Dysphagia, unspecified: Secondary | ICD-10-CM | POA: Diagnosis not present

## 2022-02-14 DIAGNOSIS — D6869 Other thrombophilia: Secondary | ICD-10-CM | POA: Diagnosis not present

## 2022-02-14 DIAGNOSIS — Z5181 Encounter for therapeutic drug level monitoring: Secondary | ICD-10-CM | POA: Diagnosis not present

## 2022-02-14 DIAGNOSIS — Z9884 Bariatric surgery status: Secondary | ICD-10-CM

## 2022-02-14 DIAGNOSIS — Z79891 Long term (current) use of opiate analgesic: Secondary | ICD-10-CM | POA: Diagnosis not present

## 2022-02-14 DIAGNOSIS — E161 Other hypoglycemia: Secondary | ICD-10-CM

## 2022-02-14 DIAGNOSIS — K921 Melena: Secondary | ICD-10-CM

## 2022-02-14 DIAGNOSIS — R7989 Other specified abnormal findings of blood chemistry: Secondary | ICD-10-CM | POA: Diagnosis not present

## 2022-02-14 DIAGNOSIS — M5136 Other intervertebral disc degeneration, lumbar region: Secondary | ICD-10-CM | POA: Diagnosis not present

## 2022-02-14 DIAGNOSIS — Z7901 Long term (current) use of anticoagulants: Secondary | ICD-10-CM

## 2022-02-14 DIAGNOSIS — M5416 Radiculopathy, lumbar region: Secondary | ICD-10-CM | POA: Diagnosis not present

## 2022-02-14 LAB — CBC WITH DIFFERENTIAL/PLATELET
Basophils Absolute: 0 10*3/uL (ref 0.0–0.1)
Basophils Relative: 0.8 % (ref 0.0–3.0)
Eosinophils Absolute: 0 10*3/uL (ref 0.0–0.7)
Eosinophils Relative: 1.1 % (ref 0.0–5.0)
HCT: 35.9 % — ABNORMAL LOW (ref 36.0–46.0)
Hemoglobin: 11.7 g/dL — ABNORMAL LOW (ref 12.0–15.0)
Lymphocytes Relative: 18.9 % (ref 12.0–46.0)
Lymphs Abs: 0.8 10*3/uL (ref 0.7–4.0)
MCHC: 32.7 g/dL (ref 30.0–36.0)
MCV: 87.9 fl (ref 78.0–100.0)
Monocytes Absolute: 0.2 10*3/uL (ref 0.1–1.0)
Monocytes Relative: 5.2 % (ref 3.0–12.0)
Neutro Abs: 3.1 10*3/uL (ref 1.4–7.7)
Neutrophils Relative %: 74 % (ref 43.0–77.0)
Platelets: 195 10*3/uL (ref 150.0–400.0)
RBC: 4.09 Mil/uL (ref 3.87–5.11)
RDW: 14.2 % (ref 11.5–15.5)
WBC: 4.2 10*3/uL (ref 4.0–10.5)

## 2022-02-14 LAB — IBC + FERRITIN
Ferritin: 674.3 ng/mL — ABNORMAL HIGH (ref 10.0–291.0)
Iron: 57 ug/dL (ref 42–145)
Saturation Ratios: 11.7 % — ABNORMAL LOW (ref 20.0–50.0)
TIBC: 485.8 ug/dL — ABNORMAL HIGH (ref 250.0–450.0)
Transferrin: 347 mg/dL (ref 212.0–360.0)

## 2022-02-14 LAB — PROTIME-INR
INR: 2.2 ratio — ABNORMAL HIGH (ref 0.8–1.0)
Prothrombin Time: 23.4 s — ABNORMAL HIGH (ref 9.6–13.1)

## 2022-02-14 NOTE — Progress Notes (Signed)
Chief Complaint: Hypoglycemia after gastric bypass, elevated LFTs  HPI : 76 year old female with history of asthma, DM, GERD, hypothyroidism, obesity s/p RYGB in 2011, DM, OSA, PE on warfarin, breast cancer presents with hypoglycemia episodes and elevated LFTs  She has had postprandial hypoglycemia since her gastric bypass in 2011. She has one hypoglycemia episode per month currently, which she can deal with fairly well. She has been taking acarbose 50 mg TID ACS, recently increased in 11/2021. Since she started her acarbose medication 2 years ago, she has seen an improvement in her hypoglycemia. She tries to avoid simple sugars. She has a Boost every morning. Denies alcohol use. Has had elevated LFTs in the past, which was attributed to a statin medication. More recently her LFTs have been noted to be elevated again. She sometimes has acid reflux depending on what she eats. She will take a Gas-X PRN to help with the GERD. She does have black tarry stools every now and then. Denies NSAIDs. Endorses use of warfarin. Denies family history of GI cancers or liver disease. Weight has been up and down. Denies N&V. She does have trouble getting pills and solids down sometimes. Will have to drink water to help get the food down. She takes a stool softener every night. Denies prior EGD or colonoscopy.  Wt Readings from Last 3 Encounters:  02/14/22 205 lb (93 kg)  01/05/22 201 lb (91.2 kg)  12/14/21 202 lb (91.6 kg)   Past Medical History:  Diagnosis Date   Allergy    Anemia    Arthritis    Asthma    Back pain, chronic    GETS INJECTIONS IN BACK   Blind right eye    hemorrhage   Blood transfusion    Cancer ([place])    breast 1994   Clotting disorder ([employer])    Diabetes mellitus without complication ([place])    takes precose   Elevated liver enzymes    Gallstones    GERD (gastroesophageal reflux disease)    HX: breast cancer    Hyperlipidemia    Hypertension    Hypothyroid    Lymphedema of arm     RT   Morbid obesity ([facility])    OSA (obstructive sleep apnea)    has not used in 2 years-lost weight   Osteoporosis    Peripheral neuropathy    on gabapentin   PONV (postoperative nausea and vomiting)    Psoriasis    Pulmonary embolism ([place]) 1998 / 1994 /1968   Syncope and collapse 2011   due to anemia   Past Surgical History:  Procedure Laterality Date   ABDOMINAL HYSTERECTOMY     BONE MARROW TRANSPLANT  1994   CARDIOVASCULAR STRESS TEST  05/23/2005   EF 53%   carpal tunnell     bil   cataracts     CHOLECYSTECTOMY  05/10/2012   Procedure: LAPAROSCOPIC CHOLECYSTECTOMY WITH INTRAOPERATIVE CHOLANGIOGRAM;  Surgeon: Pedro Earls, MD;  Location: [facility];  Service: General;  Laterality: N/A;  Laparoscopic Cholecystectomy with Intraoperative Cholangiogram   GASTRIC BYPASS  2011   bariatric surgery   HIATAL HERNIA REPAIR     IVC filter     recurrent DVT   KNEE ARTHROTOMY  1998   MASS EXCISION Right 11/24/2016   Procedure: EXCISION RIGHT AXILLARY LESION AND CHEST WALL LESION;  Surgeon: Johnathan Hausen, MD;  Location: [facility];  Service: General;  Laterality: Right;   MASTECTOMY     Right   US  ECHOCARDIOGRAPHY  10/27/08   EF 55-60%   Family History  Problem Relation Age of Onset   Sudden death Mother        car accident   Cancer Father 28       lung   Cancer Sister 57       lung cancer   Asthma Daughter    Colon cancer Neg Hx    Stomach cancer Neg Hx    Esophageal cancer Neg Hx    Colon polyps Neg Hx    Social History   Tobacco Use   Smoking status: Former    Packs/day: 0.50    Years: 10.00    Total pack years: 5.00    Types: Cigarettes    Quit date: 07/31/1978    Years since quitting: 43.5   Smokeless tobacco: Never  Vaping Use   Vaping Use: Never used  Substance Use Topics   Alcohol use: No   Drug use: No   Current Outpatient Medications  Medication Sig Dispense Refill   acarbose (PRECOSE) 50 MG tablet 1 tablet at start of breakfast and lunch  and half tablet at dinnertime 14 tablet 0   albuterol (PROAIR HFA) 108 (90 Base) MCG/ACT inhaler Inhale 2 puffs into the lungs every 6 (six) hours as needed for wheezing or shortness of breath. 24 g 3   baclofen (LIORESAL) 20 MG tablet TAKE 1 TABLET THREE TIMES A DAY (UPDATED PRESCRIPTION) 270 tablet 1   Blood Glucose Monitoring Suppl (FREESTYLE FREEDOM LITE) w/Device KIT Use as directed to check blood sugar once daily. 1 kit 0   Calcium Carbonate-Vitamin D 600-400 MG-UNIT tablet Take 1 tablet by mouth daily.     clobetasol cream (TEMOVATE) 1.61 % APPLY 1 APPLICATION TOPICALLY TWICE A DAY 120 g 5   diazepam (DIASTAT ACUDIAL) 10 MG GEL Place 5 mg rectally once as needed (cramping). 2 Package 0   ferrous sulfate 325 (65 FE) MG tablet Take 325 mg by mouth daily.     fluticasone (FLONASE) 50 MCG/ACT nasal spray Place 2 sprays into both nostrils daily. 48 g 3   fluticasone-salmeterol (ADVAIR DISKUS) 100-50 MCG/ACT AEPB USE 1 INHALATION EVERY 12 HOURS, RINSE MOUTH AFTER USING 180 each 3   FREESTYLE LITE test strip USE AS INSTRUCTED TO CHECK BLOOD SUGAR ONCE DAILY 100 strip 3   furosemide (LASIX) 80 MG tablet Take 1 tablet (80 mg total) by mouth 2 (two) times daily. 180 tablet 3   gabapentin (NEURONTIN) 600 MG tablet TAKE 1 TABLET THREE TIMES A DAY (DOSE CHANGE) 270 tablet 1   HYDROcodone-acetaminophen (NORCO) 5-325 MG tablet Take 1 tablet by mouth 2 (two) times daily as needed for moderate pain. (Patient taking differently: Take 0.5-1 tablets by mouth 3 (three) times daily as needed for moderate pain or severe pain.) 50 tablet 0   Lancets (FREESTYLE) lancets USE AS INSTRUCTED TO CHECK BLOOD SUGAR ONCE DAILY 100 each 1   levothyroxine (SYNTHROID) 100 MCG tablet TAKE 1 TABLET DAILY BEFORE BREAKFAST (NEED WHEN 88 MCG OUT) 90 tablet 1   Magnesium Cl-Calcium Carbonate (SLOW-MAG PO) Take 500 mg by mouth at bedtime.      methylcellulose (ARTIFICIAL TEARS) 1 % ophthalmic solution Place 1 drop into both eyes as  needed. Dry eyes     omeprazole (PRILOSEC) 20 MG capsule Take 1 capsule (20 mg total) by mouth daily. 10 capsule 0   potassium chloride SA (KLOR-CON M) 20 MEQ tablet Take 2 tablets (40 mEq total) by mouth 2 (two) times  daily. 360 tablet 3   pravastatin (PRAVACHOL) 10 MG tablet TAKE 1 TABLET DAILY 90 tablet 3   spironolactone (ALDACTONE) 100 MG tablet Take 1 tablet (100 mg total) by mouth daily. 30 tablet 3   temazepam (RESTORIL) 15 MG capsule 1-2 for sleep as needed 90 capsule 1   vitamin B-12 (CYANOCOBALAMIN) 500 MCG tablet Take 500 mcg by mouth daily.     warfarin (COUMADIN) 5 MG tablet TAKE 2 TABLETS DAILY EXCEPT TAKE 1 TABLET ON WEDNESDAY OR TAKE AS DIRECTED BY ANTICOAGULATION CLINIC 205 tablet 3   No current facility-administered medications for this visit.   Allergies  Allergen Reactions   Adhesive [Tape] Rash   Review of Systems: All systems reviewed and negative except where noted in HPI.   Physical Exam: BP 124/78   Pulse 66   Ht 5' 7"  (1.702 m)   Wt 205 lb (93 kg)   SpO2 95%   BMI 32.11 kg/m  Constitutional: Pleasant,well-developed, female in no acute distress. HEENT: Normocephalic and atraumatic. Conjunctivae are normal. No scleral icterus. Cardiovascular: Normal rate, regular rhythm.  Pulmonary/chest: Effort normal and breath sounds normal. No wheezing, rales or rhonchi. Abdominal: Soft, nondistended, nontender. Bowel sounds active throughout. There are no masses palpable. No hepatomegaly. Extremities: 2+ BLE edema Neurological: Alert and oriented to person place and time. Skin: Skin is warm and dry. No rashes noted. Psychiatric: Normal mood and affect. Behavior is normal.  Labs 06/2021: CBC nml  Labs 10/2021: Negative Cologuard  Labs 11/2021: HbA1C 6.9%.   Labs 12/2021: CMP with elevated Cr of 1.26 and BUN of 36. AST and ALT are elevated to 69 and 52, respectively. Alk phos and T bili are normal. TSH nml.   CT A/P w/contrast 11/05/14: IMPRESSION: Status post  right mastectomy with right axillary lymph node dissection. Radiation changes in the right upper lobe. No evidence of recurrent or metastatic disease. Postsurgical changes related to gastric bypass, cholecystectomy, and hysterectomy. IVC filter. Additional ancillary findings as above.  TTE 08/25/21: IMPRESSIONS   1. Left ventricular ejection fraction, by estimation, is 55 to 60%. The left ventricle has normal function. The left ventricle has no regional wall motion abnormalities. Left ventricular diastolic parameters were normal. The average left ventricular global longitudinal strain is -22.9 %. The global longitudinal strain is normal.   2. Right ventricular systolic function is normal. The right ventricular size is normal. There is normal pulmonary artery systolic pressure. The estimated right ventricular systolic pressure is 45.8 mmHg.   3. The mitral valve is abnormal. Mild mitral valve regurgitation. Moderate mitral annular calcification.   4. The aortic valve is tricuspid. There is moderate calcification of the aortic valve. There is moderate thickening of the aortic valve. Aortic valve regurgitation is not visualized. Aortic valve sclerosis/calcification is present, without any evidence of aortic stenosis.   5. The inferior vena cava is normal in size with <50% respiratory variability, suggesting right atrial pressure of 8 mmHg.   ASSESSMENT AND PLAN: Postprandial hypoglycemia S/p RYGB Melena Dysphagia GERD Elevated LFTs Patient presents with postprandial hypoglycemia, which sounds consistent with postprandial hyperinsulinemic hypoglycemia following RYGB. This has been controlled with dietary adjustments and acarbose, which was recently increased. Unfortunately it is possible that the acarbose medication may account for her elevated LFTs since the dosage increase of her acarbose does seem to coincide with the increase in her LFTs. Alternatively fatty liver could be another explanation.  Will go ahead and perform a basic liver work up to look for alternative etiologies of  her elevated LFTs. Will also go ahead and get some liver imaging as well. Patient also described some issues with melena and dysphagia so will plan for EGD for further evaluation. Her GERD is not bothersome to her. - Check CBC, INR, ferritin/IBC, viral hepatitis panel, ANA, ASMA, AMA, IgG - Check RUQ U/S - Avoid simple sugars and limit carbohydrate intake to less than 30 grams per meal. Can try to eat smaller meals and aim to eat more protein. - EGD LEC. Will need warfarin held 5 days beforehand so will get clearance from the patient's PCP  Kelli Reading, MD  I spent 63 minutes of time, including in depth chart review, independent review of results as outlined above, communicating results with the patient directly, face-to-face time with the patient, coordinating care, ordering studies and medications as appropriate, and documentation.

## 2022-02-14 NOTE — Patient Instructions (Addendum)
If you are age 74 or older, your body mass index should be between 23-30. Your Body mass index is 32.11 kg/m. If this is out of the aforementioned range listed, please consider follow up with your Primary Care Provider.  If you are age 4 or younger, your body mass index should be between 19-25. Your Body mass index is 32.11 kg/m. If this is out of the aformentioned range listed, please consider follow up with your Primary Care Provider.   Your provider has requested that you go to the basement level for lab work before leaving today. Press "B" on the elevator. The lab is located at the first door on the left as you exit the elevator.   You have been scheduled for an endoscopy. Please follow written instructions given to you at your visit today. If you use inhalers (even only as needed), please bring them with you on the day of your procedure.   You will be contacted by [facility] in the next 2 days to arrange a RUQ Ultra sound  The number on your caller ID will be [phone], please answer when they call.  If you have not heard from them in 2 days please call [phone] to schedule.    The [facility] providers would like to encourage you to use [facility] to communicate with providers for non-urgent requests or questions.  Due to long hold times on the telephone, sending your provider a message by [facility] may be a faster and more efficient way to get a response.  Please allow 48 business hours for a response.  Please remember that this is for non-urgent requests.   Thank you for entrusting me with your care and for choosing [facility], Dr. Christia Reading

## 2022-02-14 NOTE — Telephone Encounter (Signed)
   Mekaila Tarnow [facility] 1946/05/19 [id number]  Dear Lilyan Gilford:  We have scheduled the above named patient for a(n) Colonoscopy procedure. Our records show that (s)he is on anticoagulation therapy.  Please advise as to whether the patient may come off their therapy of Warfarin 5 days prior to their procedure which is scheduled for 03/03/22.  Please route your response to [facility] or fax response to [phone].  Sincerely,    [facility]

## 2022-02-17 NOTE — Telephone Encounter (Signed)
Pt LVM reporting pt is having a colonoscopy on 03/03/22 and was told to contact the coumadin clinic.   Proposed Lovenox bridge for procedure. CrCl: 57.51 mL/min Current Wt: 93 kg Current coumadin dosing is 10 mg daily except take 1 tablet on Wednesdays and Saturdays  Lovenox, 150 mg, QD, 9 syringes  7/30: Take last dose of coumadin 7/31: No coumadin, no Lovenox 8/1: No coumadin, Lovenox once in AM 8/2: No coumadin, Lovenox once in AM 8/3: No coumadin, Lovenox once in AM (BEFORE 7 AM)  8/4: SURGERY; NO COUMADIN, NO LOVENOX  8/5: Take 1 1/2 tablets (7.5 mg) coumadin, Lovenox once in AM 8/6: Take 3 tablets (15 mg) coumadin, Lovenox once in AM 8/7: Take 3 tablets (15 mg) coumadin, Lovenox once in AM 8/8: Take 3 tablets (15 mg) coumadin, Lovenox once in AM 8/9: Take 1 tablet (5 mg) coumadin, Lovenox once in AM 8/10: Take 2 tablets (10 mg) coumadin, Lovenox once in AM 8/11: Recheck INR at [facility] coumadin clinic

## 2022-02-19 LAB — ANA: Anti Nuclear Antibody (ANA): POSITIVE — AB

## 2022-02-19 LAB — ANTI-NUCLEAR AB-TITER (ANA TITER)
ANA TITER: 1:320 {titer} — ABNORMAL HIGH
ANA Titer 1: 1:320 {titer} — ABNORMAL HIGH

## 2022-02-19 LAB — HEPATITIS A ANTIBODY, TOTAL: Hepatitis A AB,Total: REACTIVE — AB

## 2022-02-19 LAB — MITOCHONDRIAL ANTIBODIES: Mitochondrial M2 Ab, IgG: 101.3 U — ABNORMAL HIGH (ref <=20.0)

## 2022-02-19 LAB — ANTI-SMOOTH MUSCLE ANTIBODY, IGG: Actin (Smooth Muscle) Antibody (IGG): <20 U (ref <20)

## 2022-02-19 LAB — HEPATITIS C ANTIBODY: Hepatitis C Ab: NONREACTIVE

## 2022-02-19 LAB — IGG: IgG (Immunoglobin G), Serum: 1017 mg/dL (ref 600–1540)

## 2022-02-19 LAB — HEPATITIS B SURFACE ANTIBODY,QUALITATIVE: Hep B S Ab: NONREACTIVE

## 2022-02-19 LAB — HEPATITIS B SURFACE ANTIGEN: Hepatitis B Surface Ag: NONREACTIVE

## 2022-02-23 ENCOUNTER — Ambulatory Visit (HOSPITAL_COMMUNITY)
Admission: RE | Admit: 2022-02-23 | Discharge: 2022-02-23 | Disposition: A | Discharge status: Home / Self Care | Payer: Medicare Other | Source: Ambulatory Visit | Attending: Internal Medicine | Admitting: Internal Medicine

## 2022-02-23 DIAGNOSIS — E161 Other hypoglycemia: Secondary | ICD-10-CM | POA: Diagnosis not present

## 2022-02-23 DIAGNOSIS — R1310 Dysphagia, unspecified: Secondary | ICD-10-CM | POA: Diagnosis not present

## 2022-02-23 DIAGNOSIS — R945 Abnormal results of liver function studies: Secondary | ICD-10-CM | POA: Diagnosis not present

## 2022-02-23 DIAGNOSIS — R7989 Other specified abnormal findings of blood chemistry: Secondary | ICD-10-CM | POA: Diagnosis not present

## 2022-02-23 DIAGNOSIS — Z9049 Acquired absence of other specified parts of digestive tract: Secondary | ICD-10-CM | POA: Diagnosis not present

## 2022-02-23 DIAGNOSIS — K921 Melena: Secondary | ICD-10-CM | POA: Insufficient documentation

## 2022-02-23 DIAGNOSIS — Z9884 Bariatric surgery status: Secondary | ICD-10-CM | POA: Insufficient documentation

## 2022-02-23 NOTE — Telephone Encounter (Signed)
Contacted pt and [facility] coumadin clinic apt for tomorrow to advise of dosing instructions for colonoscopy. She reported she just had an ultrasound and depending on those results will depend on whether she will have the colonoscopy. They will contact her with results as soon as the [place] is read. Advised if colonoscopy is cancelled to let coumadin clinic know. Pt verbalized understanding.

## 2022-02-23 NOTE — Telephone Encounter (Signed)
Please note this provider was on vacation and out of the office until this wk.  Given h/o PEs would bridge.

## 2022-02-24 ENCOUNTER — Ambulatory Visit (INDEPENDENT_AMBULATORY_CARE_PROVIDER_SITE_OTHER): Payer: Medicare Other

## 2022-02-24 DIAGNOSIS — Z7901 Long term (current) use of anticoagulants: Secondary | ICD-10-CM | POA: Diagnosis not present

## 2022-02-24 LAB — POCT INR: INR: 2.1 (ref 2.0–3.0)

## 2022-02-24 MED ORDER — ENOXAPARIN SODIUM 150 MG/ML IJ SOSY: 150.0000 mg | PREFILLED_SYRINGE | INTRAMUSCULAR | 0 refills | Status: DC

## 2022-02-24 NOTE — Patient Instructions (Addendum)
Pre visit review using our clinic review tool, if applicable. No additional management support is needed unless otherwise documented below in the visit note.  Continue to take 2 tablets daily except 1 tablet on Wed and Saturdays until you start the instructions below. Recheck in 2 weeks.   7/30: Take last dose of coumadin 7/31: No coumadin, no Lovenox 8/1: No coumadin, Lovenox once in AM 8/2: No coumadin, Lovenox once in AM 8/3: No coumadin, Lovenox once in AM (BEFORE 7 AM)   8/4: SURGERY; NO COUMADIN, NO LOVENOX   8/5: Take 1 1/2 tablets (7.5 mg) coumadin, Lovenox once in AM 8/6: Take 3 tablets (15 mg) coumadin, Lovenox once in AM 8/7: Take 3 tablets (15 mg) coumadin, Lovenox once in AM 8/8: Take 3 tablets (15 mg) coumadin, Lovenox once in AM 8/9: Take 1 tablet (5 mg) coumadin, Lovenox once in AM 8/10: Take 2 tablets (10 mg) coumadin, Lovenox once in AM 8/11: Recheck INR at [facility] coumadin clinic

## 2022-02-24 NOTE — Telephone Encounter (Signed)
Noted. Pt will be in this morning for bridge instructions.

## 2022-02-24 NOTE — Progress Notes (Signed)
   7/30: Take last dose of coumadin 7/31: No coumadin, no Lovenox 8/1: No coumadin, Lovenox once in AM 8/2: No coumadin, Lovenox once in AM 8/3: No coumadin, Lovenox once in AM (BEFORE 7 AM)   8/4: SURGERY; NO COUMADIN, NO LOVENOX   8/5: Take 1 1/2 tablets (7.5 mg) coumadin, Lovenox once in AM 8/6: Take 3 tablets (15 mg) coumadin, Lovenox once in AM 8/7: Take 3 tablets (15 mg) coumadin, Lovenox once in AM 8/8: Take 3 tablets (15 mg) coumadin, Lovenox once in AM 8/9: Take 1 tablet (5 mg) coumadin, Lovenox once in AM 8/10: Take 2 tablets (10 mg) coumadin, Lovenox once in AM 8/11: Recheck INR at [facility] coumadin clinic

## 2022-02-27 MED ORDER — ENOXAPARIN SODIUM 150 MG/ML IJ SOSY: 150.0000 mg | PREFILLED_SYRINGE | INTRAMUSCULAR | 0 refills | Status: DC

## 2022-02-27 NOTE — Addendum Note (Signed)
Addended by: Randall An on: 02/27/2022 12:26 PM   Modules accepted: Orders

## 2022-02-27 NOTE — Telephone Encounter (Signed)
F/u with pt who reports she has not gone to pick up lovenox yet. She has also not received a call from them yet. Advised if any difficulties to contact coumadin clinic first thing in the morning. Pt verbalized understanding.

## 2022-02-27 NOTE — Telephone Encounter (Addendum)
Pt reports [facility] advised they had to order the lovenox and it should be in tomorrow. Pt is to start lovenox injections tomorrow morning. Unsure what time [facility] will receive order.  Advised pt she could try a different pharmacy. Pt would [facility] try [facility] on [address] if they will take her [employer]. If that will not work she will have to pay out of pocket.  Advised this nurse will f/u with the pharmacy and then contact her. Pt appreciative and verbalized understanding.   Contacted [facility] and spoke with Otila Kluver who reports they do have the lovenox and will take [employer]. She reports the pt may have to pay $6-9 for the entire prescription.   Contacted pt who reports she would like [facility]. Advised if any problems to contact office. Pt appreciative and verbalized understanding. Pt requested [facility] number for [facility] be sent mychart. Sent msg to pt with phone number for [facility].  Sent mychart msg with number.   [facility] to [facility]

## 2022-03-03 ENCOUNTER — Encounter: Payer: Self-pay | Admitting: Internal Medicine

## 2022-03-03 ENCOUNTER — Ambulatory Visit: Payer: Medicare Other

## 2022-03-03 ENCOUNTER — Ambulatory Visit (AMBULATORY_SURGERY_CENTER): Payer: Medicare Other | Admitting: Internal Medicine

## 2022-03-03 VITALS — BP 159/61 | HR 60 | Temp 97.1°F | Resp 10 | Ht 67.0 in | Wt 205.0 lb

## 2022-03-03 DIAGNOSIS — K317 Polyp of stomach and duodenum: Secondary | ICD-10-CM | POA: Diagnosis not present

## 2022-03-03 DIAGNOSIS — I10 Essential (primary) hypertension: Secondary | ICD-10-CM | POA: Diagnosis not present

## 2022-03-03 DIAGNOSIS — K921 Melena: Secondary | ICD-10-CM | POA: Diagnosis not present

## 2022-03-03 DIAGNOSIS — E039 Hypothyroidism, unspecified: Secondary | ICD-10-CM | POA: Diagnosis not present

## 2022-03-03 DIAGNOSIS — J45909 Unspecified asthma, uncomplicated: Secondary | ICD-10-CM | POA: Diagnosis not present

## 2022-03-03 DIAGNOSIS — K2970 Gastritis, unspecified, without bleeding: Secondary | ICD-10-CM | POA: Diagnosis not present

## 2022-03-03 DIAGNOSIS — R1310 Dysphagia, unspecified: Secondary | ICD-10-CM | POA: Diagnosis not present

## 2022-03-03 DIAGNOSIS — K449 Diaphragmatic hernia without obstruction or gangrene: Secondary | ICD-10-CM | POA: Diagnosis not present

## 2022-03-03 DIAGNOSIS — E119 Type 2 diabetes mellitus without complications: Secondary | ICD-10-CM | POA: Diagnosis not present

## 2022-03-03 DIAGNOSIS — K319 Disease of stomach and duodenum, unspecified: Secondary | ICD-10-CM | POA: Diagnosis not present

## 2022-03-03 MED ORDER — SODIUM CHLORIDE 0.9 % IV SOLN: 500.0000 mL | Freq: Once | INTRAVENOUS | Status: DC

## 2022-03-03 MED ORDER — OMEPRAZOLE 40 MG PO CPDR: 40.0000 mg | DELAYED_RELEASE_CAPSULE | Freq: Two times a day (BID) | ORAL | 1 refills | Status: DC

## 2022-03-03 NOTE — Progress Notes (Signed)
 GASTROENTEROLOGY PROCEDURE H&P NOTE   Primary Care Physician: Banks, Shannon R, MD    Reason for Procedure:  Melena, dysphagia, GERD, s/p RYGB  Plan:    EGD  Patient is appropriate for endoscopic procedure(s) in the ambulatory ([facility]) setting.  The nature of the procedure, as well as the risks, benefits, and alternatives were carefully and thoroughly reviewed with the patient. Ample time for discussion and questions allowed. The patient understood, was satisfied, and agreed to proceed.     HPI: Kelli Martinez is a 76 y.o. female who presents for EGD for evaluation of melena, dysphagia, GERD, s/p RYGB .  Patient was most recently seen in the Gastroenterology Clinic on 02/14/22.  No interval change in medical history since that appointment. Please refer to that note for full details regarding GI history and clinical presentation.   Past Medical History:  Diagnosis Date   Allergy    Anemia    Arthritis    Asthma    Back pain, chronic    GETS INJECTIONS IN BACK   Blind right eye    hemorrhage   Blood transfusion    Cancer (HCC)    breast 1994   Clotting disorder (HCC)    Diabetes mellitus without complication (HCC)    takes precose   Elevated liver enzymes    Gallstones    GERD (gastroesophageal reflux disease)    HX: breast cancer    Hyperlipidemia    Hypertension    Hypothyroid    Lymphedema of arm    RT   Morbid obesity (HCC)    OSA (obstructive sleep apnea)    has not used in 2 years-lost weight   Osteoporosis    Peripheral neuropathy    on gabapentin   PONV (postoperative nausea and vomiting)    Psoriasis    Pulmonary embolism (HCC) 1998 / 1994 /1968   Syncope and collapse 2011   due to anemia    Past Surgical History:  Procedure Laterality Date   ABDOMINAL HYSTERECTOMY     BONE MARROW TRANSPLANT  1994   CARDIOVASCULAR STRESS TEST  05/23/2005   EF 53%   carpal tunnell     bil   cataracts     CHOLECYSTECTOMY  05/10/2012   Procedure:  LAPAROSCOPIC CHOLECYSTECTOMY WITH INTRAOPERATIVE CHOLANGIOGRAM;  Surgeon: Matthew B Martin, MD;  Location: [facility];  Service: General;  Laterality: N/A;  Laparoscopic Cholecystectomy with Intraoperative Cholangiogram   GASTRIC BYPASS  2011   bariatric surgery   HIATAL HERNIA REPAIR     IVC filter     recurrent DVT   KNEE ARTHROTOMY  1998   MASS EXCISION Right 11/24/2016   Procedure: EXCISION RIGHT AXILLARY LESION AND CHEST WALL LESION;  Surgeon: Matthew Martin, MD;  Location: [facility];  Service: General;  Laterality: Right;   MASTECTOMY     Right   US ECHOCARDIOGRAPHY  10/27/08   EF 55-60%    Prior to Admission medications   Medication Sig Start Date End Date Taking? Authorizing Provider  acarbose (PRECOSE) 50 MG tablet 1 tablet at start of breakfast and lunch and half tablet at dinnertime 09/23/21   Kumar, Ajay, MD  albuterol (PROAIR HFA) 108 (90 Base) MCG/ACT inhaler Inhale 2 puffs into the lungs every 6 (six) hours as needed for wheezing or shortness of breath. 09/13/21   Young, Clinton D, MD  baclofen (LIORESAL) 20 MG tablet TAKE 1 TABLET THREE TIMES A DAY (UPDATED PRESCRIPTION) 12/01/21   Banks, Shannon R, MD    Blood Glucose Monitoring Suppl (FREESTYLE FREEDOM LITE) w/Device KIT Use as directed to check blood sugar once daily. 02/20/19   Elayne Snare, MD  Calcium Carbonate-Vitamin D 600-400 MG-UNIT tablet Take 1 tablet by mouth daily.    [provider]  clobetasol cream (TEMOVATE) 5.68 % APPLY 1 APPLICATION TOPICALLY TWICE A DAY 12/01/21   Billie Ruddy, MD  diazepam (DIASTAT ACUDIAL) 10 MG GEL Place 5 mg rectally once as needed (cramping). 02/13/17   Drenda Freeze, MD  enoxaparin (LOVENOX) 150 MG/ML injection Inject 1 mL (150 mg total) into the skin daily. USE AS DIRECTED BY ANTICOAGULATION CLINIC 02/27/22   Billie Ruddy, MD  ferrous sulfate 325 (65 FE) MG tablet Take 325 mg by mouth daily.    [provider]  fluticasone (FLONASE) 50 MCG/ACT nasal  spray Place 2 sprays into both nostrils daily. 09/13/21   Baird Lyons D, MD  fluticasone-salmeterol (ADVAIR DISKUS) 100-50 MCG/ACT AEPB USE 1 INHALATION EVERY 12 HOURS, RINSE MOUTH AFTER USING 09/13/21   Baird Lyons D, MD  FREESTYLE LITE test strip USE AS INSTRUCTED TO CHECK BLOOD SUGAR ONCE DAILY 05/05/21   Elayne Snare, MD  furosemide (LASIX) 80 MG tablet Take 1 tablet (80 mg total) by mouth 2 (two) times daily. 09/16/21 02/14/22  Janina Mayo, MD  gabapentin (NEURONTIN) 600 MG tablet TAKE 1 TABLET THREE TIMES A DAY (DOSE CHANGE) 12/01/21   Billie Ruddy, MD  HYDROcodone-acetaminophen (NORCO) 5-325 MG tablet Take 1 tablet by mouth 2 (two) times daily as needed for moderate pain. Patient taking differently: Take 0.5-1 tablets by mouth 3 (three) times daily as needed for moderate pain or severe pain. 04/12/18   Elayne Snare, MD  Lancets (FREESTYLE) lancets USE AS INSTRUCTED TO CHECK BLOOD SUGAR ONCE DAILY 09/23/21   Elayne Snare, MD  levothyroxine (SYNTHROID) 100 MCG tablet TAKE 1 TABLET DAILY BEFORE BREAKFAST (NEED WHEN 88 MCG OUT) 11/07/21   Elayne Snare, MD  Magnesium Cl-Calcium Carbonate (SLOW-MAG PO) Take 500 mg by mouth at bedtime.     [provider]  methylcellulose (ARTIFICIAL TEARS) 1 % ophthalmic solution Place 1 drop into both eyes as needed. Dry eyes    [provider]  omeprazole (PRILOSEC) 20 MG capsule Take 1 capsule (20 mg total) by mouth daily. 12/01/21   Billie Ruddy, MD  potassium chloride SA (KLOR-CON M) 20 MEQ tablet Take 2 tablets (40 mEq total) by mouth 2 (two) times daily. 09/16/21   Janina Mayo, MD  pravastatin (PRAVACHOL) 10 MG tablet TAKE 1 TABLET DAILY 01/02/22   Billie Ruddy, MD  spironolactone (ALDACTONE) 100 MG tablet Take 1 tablet (100 mg total) by mouth daily. 08/16/21   Janina Mayo, MD  temazepam (RESTORIL) 15 MG capsule 1-2 for sleep as needed 08/31/17   Baird Lyons D, MD  vitamin B-12 (CYANOCOBALAMIN) 500 MCG tablet Take 500 mcg by mouth  daily.    [provider]  warfarin (COUMADIN) 5 MG tablet TAKE 2 TABLETS DAILY EXCEPT TAKE 1 TABLET ON WEDNESDAY OR TAKE AS DIRECTED BY ANTICOAGULATION CLINIC 10/17/21   Billie Ruddy, MD    Current Outpatient Medications  Medication Sig Dispense Refill   acarbose (PRECOSE) 50 MG tablet 1 tablet at start of breakfast and lunch and half tablet at dinnertime 14 tablet 0   albuterol (PROAIR HFA) 108 (90 Base) MCG/ACT inhaler Inhale 2 puffs into the lungs every 6 (six) hours as needed for wheezing or shortness of breath. 24 g  3   baclofen (LIORESAL) 20 MG tablet TAKE 1 TABLET THREE TIMES A DAY (UPDATED PRESCRIPTION) 270 tablet 1   Blood Glucose Monitoring Suppl (FREESTYLE FREEDOM LITE) w/Device KIT Use as directed to check blood sugar once daily. 1 kit 0   Calcium Carbonate-Vitamin D 600-400 MG-UNIT tablet Take 1 tablet by mouth daily.     clobetasol cream (TEMOVATE) 4.65 % APPLY 1 APPLICATION TOPICALLY TWICE A DAY 120 g 5   diazepam (DIASTAT ACUDIAL) 10 MG GEL Place 5 mg rectally once as needed (cramping). 2 Package 0   enoxaparin (LOVENOX) 150 MG/ML injection Inject 1 mL (150 mg total) into the skin daily. USE AS DIRECTED BY ANTICOAGULATION CLINIC 9 mL 0   ferrous sulfate 325 (65 FE) MG tablet Take 325 mg by mouth daily.     fluticasone (FLONASE) 50 MCG/ACT nasal spray Place 2 sprays into both nostrils daily. 48 g 3   fluticasone-salmeterol (ADVAIR DISKUS) 100-50 MCG/ACT AEPB USE 1 INHALATION EVERY 12 HOURS, RINSE MOUTH AFTER USING 180 each 3   FREESTYLE LITE test strip USE AS INSTRUCTED TO CHECK BLOOD SUGAR ONCE DAILY 100 strip 3   furosemide (LASIX) 80 MG tablet Take 1 tablet (80 mg total) by mouth 2 (two) times daily. 180 tablet 3   gabapentin (NEURONTIN) 600 MG tablet TAKE 1 TABLET THREE TIMES A DAY (DOSE CHANGE) 270 tablet 1   HYDROcodone-acetaminophen (NORCO) 5-325 MG tablet Take 1 tablet by mouth 2 (two) times daily as needed for moderate pain. (Patient taking differently: Take  0.5-1 tablets by mouth 3 (three) times daily as needed for moderate pain or severe pain.) 50 tablet 0   Lancets (FREESTYLE) lancets USE AS INSTRUCTED TO CHECK BLOOD SUGAR ONCE DAILY 100 each 1   levothyroxine (SYNTHROID) 100 MCG tablet TAKE 1 TABLET DAILY BEFORE BREAKFAST (NEED WHEN 88 MCG OUT) 90 tablet 1   Magnesium Cl-Calcium Carbonate (SLOW-MAG PO) Take 500 mg by mouth at bedtime.      methylcellulose (ARTIFICIAL TEARS) 1 % ophthalmic solution Place 1 drop into both eyes as needed. Dry eyes     omeprazole (PRILOSEC) 20 MG capsule Take 1 capsule (20 mg total) by mouth daily. 10 capsule 0   potassium chloride SA (KLOR-CON M) 20 MEQ tablet Take 2 tablets (40 mEq total) by mouth 2 (two) times daily. 360 tablet 3   pravastatin (PRAVACHOL) 10 MG tablet TAKE 1 TABLET DAILY 90 tablet 3   spironolactone (ALDACTONE) 100 MG tablet Take 1 tablet (100 mg total) by mouth daily. 30 tablet 3   temazepam (RESTORIL) 15 MG capsule 1-2 for sleep as needed 90 capsule 1   vitamin B-12 (CYANOCOBALAMIN) 500 MCG tablet Take 500 mcg by mouth daily.     warfarin (COUMADIN) 5 MG tablet TAKE 2 TABLETS DAILY EXCEPT TAKE 1 TABLET ON WEDNESDAY OR TAKE AS DIRECTED BY ANTICOAGULATION CLINIC 205 tablet 3   Current Facility-Administered Medications  Medication Dose Route Frequency Provider Last Rate Last Admin   0.9 %  sodium chloride infusion  500 mL Intravenous Once Sharyn Creamer, MD        Allergies as of 03/03/2022 - Review Complete 03/03/2022  Allergen Reaction Noted   Adhesive [tape] Rash 04/23/2012    Family History  Problem Relation Age of Onset   Sudden death Mother        car accident   Cancer Father 71       lung   Cancer Sister 71       lung cancer  Asthma Daughter    Colon cancer Neg Hx    Stomach cancer Neg Hx    Esophageal cancer Neg Hx    Colon polyps Neg Hx     Social History   Socioeconomic History   Marital status: Widowed    Spouse name: Not on file   Number of children: 2   Years  of education: Not on file   Highest education level: Not on file  Occupational History   Occupation: retired  Tobacco Use   Smoking status: Former    Packs/day: 0.50    Years: 10.00    Total pack years: 5.00    Types: Cigarettes    Quit date: 07/31/1978    Years since quitting: 43.6   Smokeless tobacco: Never  Vaping Use   Vaping Use: Never used  Substance and Sexual Activity   Alcohol use: No   Drug use: No   Sexual activity: Not Currently  Other Topics Concern   Not on file  Social History Narrative   Widowed with 2 children,  Lives in a one story home and her son and his wife recently moved in with her.  Retired Freight forwarder for [employer].  Education: some college.    Social Determinants of Health   Financial Resource Strain: Low Risk  (10/11/2021)   Overall Financial Resource Strain (CARDIA)    Difficulty of Paying Living Expenses: Not hard at all  Food Insecurity: No Food Insecurity (10/11/2021)   Hunger Vital Sign    Worried About Running Out of Food in the Last Year: Never true    Ran Out of Food in the Last Year: Never true  Transportation Needs: No Transportation Needs (10/11/2021)   PRAPARE - Hydrologist (Medical): No    Lack of Transportation (Non-Medical): No  Physical Activity: Inactive (10/11/2021)   Exercise Vital Sign    Days of Exercise per Week: 0 days    Minutes of Exercise per Session: 0 min  Stress: No Stress Concern Present (10/11/2021)   [place]    Feeling of Stress : Not at all  Social Connections: Moderately Isolated (10/05/2020)   Social Connection and Isolation Panel [NHANES]    Frequency of Communication with Friends and Family: More than three times a week    Frequency of Social Gatherings with Friends and Family: Twice a week    Attends Religious Services: More than 4 times per year    Active Member of [employer] or Organizations: No    Attends Archivist  Meetings: Never    Marital Status: Widowed  Intimate Partner Violence: Not At Risk (10/05/2020)   Humiliation, Afraid, Rape, and Kick questionnaire    Fear of Current or Ex-Partner: No    Emotionally Abused: No    Physically Abused: No    Sexually Abused: No    Physical Exam: Vital signs in last 24 hours: BP (!) 97/51   Pulse 69   Temp (!) 97.1 F (36.2 C)   Ht 5' 7" (1.702 m)   Wt 205 lb (93 kg)   SpO2 100%   BMI 32.11 kg/m  GEN: NAD EYE: Sclerae anicteric ENT: MMM CV: Non-tachycardic Pulm: No increased WOB GI: Soft NEURO:  Alert & Oriented   Christia Reading, MD [facility]   03/03/2022 1:58 PM

## 2022-03-03 NOTE — Progress Notes (Signed)
Sedate, gd SR, tolerated procedure well, VSS, report to RN 

## 2022-03-03 NOTE — Op Note (Addendum)
[facility] [address] [facility] Patient Name: [name] Procedure Date: 03/03/2022 2:05 PM MRN: [id number] Endoscopist: Sonny Masters "Kelli Martinez ,  Age: 76 Referring MD:  Date of Birth: 1945-08-30 Gender: Female Account #: [id number] Procedure:                Upper GI endoscopy Indications:              Dysphagia, Melena Medicines:                Monitored Anesthesia Care Procedure:                Pre-Anesthesia Assessment:                           - Prior to the procedure, a History and Physical                            was performed, and patient medications and                            allergies were reviewed. The patient's tolerance of                            previous anesthesia was also reviewed. The risks                            and benefits of the procedure and the sedation                            options and risks were discussed with the patient.                            All questions were answered, and informed consent                            was obtained. Prior Anticoagulants: The patient has                            taken Coumadin (warfarin), last dose was 10 days                            prior to procedure. ASA Grade Assessment: III - A                            patient with severe systemic disease. After                            reviewing the risks and benefits, the patient was                            deemed in satisfactory condition to undergo the                            procedure.  After obtaining informed consent, the endoscope was                            passed under direct vision. Throughout the                            procedure, the patient's blood pressure, pulse, and                            oxygen saturations were monitored continuously. The                            GIF [id number] #[id number] was introduced through the                            mouth, and advanced to the second part of duodenum.                             The upper GI endoscopy was accomplished without                            difficulty. The patient tolerated the procedure                            well. Scope In: Scope Out: Findings:                 The examined esophagus was normal.                           A small hiatal hernia was present.                           Localized inflammation characterized by congestion                            (edema), erosions and erythema was found in the                            gastric body. Biopsies were taken with a cold                            forceps for histology.                           Two 3 to 5 mm sessile polyps with no bleeding and                            no stigmata of recent bleeding were found in the                            gastric body. These polyps were removed with a cold                            snare. Resection  and retrieval were complete.                           Evidence of a Roux-en-Y gastrojejunostomy was                            found. The gastrojejunal anastomosis was                            characterized by healthy appearing mucosa. This was                            traversed. The pouch-to-jejunum limb was                            characterized by healthy appearing mucosa. Blind                            limb was also examined that was characterized by                            healthy mucosa.                           The examined jejunum was normal. Biopsies were                            taken with a cold forceps for histology. Complications:            No immediate complications. Estimated Blood Loss:     Estimated blood loss was minimal. Impression:               - Normal esophagus.                           - Small hiatal hernia.                           - Gastritis. Biopsied.                           - Two gastric polyps. Resected and retrieved.                           - Roux-en-Y gastrojejunostomy with gastrojejunal                             anastomosis characterized by healthy appearing                            mucosa.                           - Normal examined jejunum. Biopsied. Recommendation:           - Discharge patient to home (with escort).                           -  Await pathology results.                           - Use Prilosec (omeprazole) 40 mg PO BID for 8                            weeks.                           - Okay to restart warfarin today                           - Return to GI clinic in 1-2 months.                           - The findings and recommendations were discussed                            with the patient. [address]Kelli Martinez,  03/03/2022 2:39:32 PM

## 2022-03-03 NOTE — Progress Notes (Signed)
Called to room to assist during endoscopic procedure.  Patient ID and intended procedure confirmed with present staff. Received instructions for my participation in the procedure from the performing physician.  

## 2022-03-03 NOTE — Patient Instructions (Signed)
Handouts Provided:  Gastritis and Hiatal Hernia  YOU HAD AN ENDOSCOPIC PROCEDURE TODAY AT THE [facility]:   Refer to the procedure report that was given to you for any specific questions about what was found during the examination.  If the procedure report does not answer your questions, please call your gastroenterologist to clarify.  If you requested that your care partner not be given the details of your procedure findings, then the procedure report has been included in a sealed envelope for you to review at your convenience later.  YOU SHOULD EXPECT: Some feelings of bloating in the abdomen. Passage of more gas than usual.  Walking can help get rid of the air that was put into your GI tract during the procedure and reduce the bloating. If you had a lower endoscopy (such as a colonoscopy or flexible sigmoidoscopy) you may notice spotting of blood in your stool or on the toilet paper. If you underwent a bowel prep for your procedure, you may not have a normal bowel movement for a few days.  Please Note:  You might notice some irritation and congestion in your nose or some drainage.  This is from the oxygen used during your procedure.  There is no need for concern and it should clear up in a day or so.  SYMPTOMS TO REPORT IMMEDIATELY:  Following upper endoscopy (EGD)  Vomiting of blood or coffee ground material  New chest pain or pain under the shoulder blades  Painful or persistently difficult swallowing  New shortness of breath  Fever of 100F or higher  Black, tarry-looking stools  For urgent or emergent issues, a gastroenterologist can be reached at any hour by calling [phone]. Do not use MyChart messaging for urgent concerns.    DIET:  We do recommend a small meal at first, but then you may proceed to your regular diet.  Drink plenty of fluids but you should avoid alcoholic beverages for 24 hours.  ACTIVITY:  You should plan to take it easy for the rest of today and  you should NOT DRIVE or use heavy machinery until tomorrow (because of the sedation medicines used during the test).    FOLLOW UP: Our staff will call the number listed on your records the next business day following your procedure.  We will call around 7:15- 8:00 am to check on you and address any questions or concerns that you may have regarding the information given to you following your procedure. If we do not reach you, we will leave a message.  If you develop any symptoms (ie: fever, flu-like symptoms, shortness of breath, cough etc.) before then, please call [phone].  If you test positive for Covid 19 in the 2 weeks post procedure, please call and report this information to [place].    If any biopsies were taken you will be contacted by phone or by letter within the next 1-3 weeks.  Please call us at [phone] if you have not heard about the biopsies in 3 weeks.    SIGNATURES/CONFIDENTIALITY: You and/or your care partner have signed paperwork which will be entered into your electronic medical record.  These signatures attest to the fact that that the information above on your After Visit Summary has been reviewed and is understood.  Full responsibility of the confidentiality of this discharge information lies with you and/or your care-partner.

## 2022-03-06 ENCOUNTER — Telehealth: Payer: Self-pay

## 2022-03-06 NOTE — Telephone Encounter (Signed)
Left message on follow up call. 

## 2022-03-10 ENCOUNTER — Ambulatory Visit (INDEPENDENT_AMBULATORY_CARE_PROVIDER_SITE_OTHER): Payer: Medicare Other

## 2022-03-10 ENCOUNTER — Telehealth: Payer: Self-pay | Admitting: Oncology

## 2022-03-10 ENCOUNTER — Encounter: Payer: Self-pay | Admitting: Internal Medicine

## 2022-03-10 DIAGNOSIS — K319 Disease of stomach and duodenum, unspecified: Secondary | ICD-10-CM

## 2022-03-10 DIAGNOSIS — D509 Iron deficiency anemia, unspecified: Secondary | ICD-10-CM

## 2022-03-10 DIAGNOSIS — Z7901 Long term (current) use of anticoagulants: Secondary | ICD-10-CM | POA: Diagnosis not present

## 2022-03-10 LAB — POCT INR: INR: 2 (ref 2.0–3.0)

## 2022-03-10 NOTE — Telephone Encounter (Signed)
Scheduled appt per 8/11 referral. Pt is aware of appt date and time. Pt is aware to arrive 15 mins prior to appt time and to bring and updated insurance card. Pt is aware of appt location.   

## 2022-03-10 NOTE — Patient Instructions (Addendum)
Pre visit review using our clinic review tool, if applicable. No additional management support is needed unless otherwise documented below in the visit note.  Continue to take 2 tablets daily except 1 tablet on Wed and Saturdays. Recheck in 6 weeks.

## 2022-03-10 NOTE — Progress Notes (Addendum)
Pt has been on lovenox bridge for EGD on 8/4.  Continue to take 2 tablets daily except 1 tablet on Wed and Saturdays. Recheck in 6 weeks.

## 2022-03-16 ENCOUNTER — Other Ambulatory Visit: Payer: Self-pay

## 2022-03-16 DIAGNOSIS — K912 Postsurgical malabsorption, not elsewhere classified: Secondary | ICD-10-CM

## 2022-03-16 MED ORDER — ACARBOSE 50 MG PO TABS: ORAL_TABLET | ORAL | 0 refills | Status: DC

## 2022-03-16 MED ORDER — ACARBOSE 50 MG PO TABS: ORAL_TABLET | ORAL | 3 refills | Status: DC

## 2022-03-17 DIAGNOSIS — E1151 Type 2 diabetes mellitus with diabetic peripheral angiopathy without gangrene: Secondary | ICD-10-CM | POA: Diagnosis not present

## 2022-03-17 DIAGNOSIS — L84 Corns and callosities: Secondary | ICD-10-CM | POA: Diagnosis not present

## 2022-03-17 DIAGNOSIS — I739 Peripheral vascular disease, unspecified: Secondary | ICD-10-CM | POA: Diagnosis not present

## 2022-03-17 DIAGNOSIS — L603 Nail dystrophy: Secondary | ICD-10-CM | POA: Diagnosis not present

## 2022-03-22 ENCOUNTER — Other Ambulatory Visit: Payer: Self-pay

## 2022-03-22 DIAGNOSIS — K912 Postsurgical malabsorption, not elsewhere classified: Secondary | ICD-10-CM

## 2022-03-22 MED ORDER — ACARBOSE 50 MG PO TABS: ORAL_TABLET | ORAL | 3 refills | Status: DC

## 2022-03-31 ENCOUNTER — Inpatient Hospital Stay: Payer: Medicare Other | Attending: Oncology | Admitting: Oncology

## 2022-03-31 ENCOUNTER — Other Ambulatory Visit: Payer: Self-pay

## 2022-03-31 VITALS — BP 124/70 | HR 63 | Temp 98.1°F | Resp 17 | Ht 67.0 in | Wt 207.5 lb

## 2022-03-31 DIAGNOSIS — R7989 Other specified abnormal findings of blood chemistry: Secondary | ICD-10-CM | POA: Diagnosis not present

## 2022-03-31 DIAGNOSIS — D649 Anemia, unspecified: Secondary | ICD-10-CM | POA: Diagnosis not present

## 2022-03-31 DIAGNOSIS — Z7984 Long term (current) use of oral hypoglycemic drugs: Secondary | ICD-10-CM | POA: Diagnosis not present

## 2022-03-31 DIAGNOSIS — Z87891 Personal history of nicotine dependence: Secondary | ICD-10-CM | POA: Diagnosis not present

## 2022-03-31 DIAGNOSIS — Z9884 Bariatric surgery status: Secondary | ICD-10-CM | POA: Insufficient documentation

## 2022-03-31 DIAGNOSIS — Z853 Personal history of malignant neoplasm of breast: Secondary | ICD-10-CM | POA: Insufficient documentation

## 2022-03-31 DIAGNOSIS — Z9484 Stem cells transplant status: Secondary | ICD-10-CM | POA: Insufficient documentation

## 2022-03-31 DIAGNOSIS — E119 Type 2 diabetes mellitus without complications: Secondary | ICD-10-CM | POA: Insufficient documentation

## 2022-03-31 NOTE — Progress Notes (Signed)
Reason for the request:    Anemia  HPI: I was asked by Dr. Lorenso Courier to evaluate Kelli Martinez for the evaluation of iron deficiency.  She is a 76 year old Martinez with history of diabetes, GERD as well as gastric bypass surgery in 2011.  She has also history of breast cancer status post stem cell transplant close to 30 years ago.  She was referred to Dr. Lorenso Courier for elevated liver function test and postprandial hypoglycemia.  CBC obtained in July 2023 showed a hemoglobin of 11.7 hematocrit of 35.  Her MCV was 87.9.  Her iron level was 57 with saturation of 11.7%.  Her ferritin is 674.3.  The total iron binding capacity was 485.8.  Previous vitamin B12 level obtained in December 2022 was 1064.  Her ANA was positive with a titer of 1:320.  She is scheduled for endoscopy 2 to melena.  Clinically, she reports feeling fatigue and overall generalized weakness but no hematochezia.  No nausea or vomiting or abdominal pain.  She still ambulates with the help of a cane.   She does not report any headaches, blurry vision, syncope or seizures. Does not report any fevers, chills or sweats.  Does not report any cough, wheezing or hemoptysis.  Does not report any chest pain, palpitation, orthopnea or leg edema.  Does not report any nausea, vomiting or abdominal pain.  Does not report any constipation or diarrhea.  Does not report any skeletal complaints.    Does not report frequency, urgency or hematuria.  Does not report any skin rashes or lesions. Does not report any heat or cold intolerance.  Does not report any lymphadenopathy or petechiae.  Does not report any anxiety or depression.  Remaining review of systems is negative.     Past Medical History:  Diagnosis Date   Allergy    Anemia    Arthritis    Asthma    Back pain, chronic    GETS INJECTIONS IN BACK   Blind right eye    hemorrhage   Blood transfusion    Cancer ([facility])    breast 1994   Clotting disorder ([place])    Diabetes mellitus without complication ([place])     takes precose   Elevated liver enzymes    Gallstones    GERD (gastroesophageal reflux disease)    HX: breast cancer    Hyperlipidemia    Hypertension    Hypothyroid    Lymphedema of arm    RT   Morbid obesity ([facility])    OSA (obstructive sleep apnea)    has not used in 2 years-lost weight   Osteoporosis    Peripheral neuropathy    on gabapentin   PONV (postoperative nausea and vomiting)    Psoriasis    Pulmonary embolism ([place]) 1998 / 1994 /1968   Syncope and collapse 2011   due to anemia  :   Past Surgical History:  Procedure Laterality Date   ABDOMINAL HYSTERECTOMY     BONE MARROW TRANSPLANT  1994   CARDIOVASCULAR STRESS TEST  05/23/2005   EF 53%   carpal tunnell     bil   cataracts     CHOLECYSTECTOMY  05/10/2012   Procedure: LAPAROSCOPIC CHOLECYSTECTOMY WITH INTRAOPERATIVE CHOLANGIOGRAM;  Surgeon: Pedro Earls, MD;  Location: [facility];  Service: General;  Laterality: N/A;  Laparoscopic Cholecystectomy with Intraoperative Cholangiogram   GASTRIC BYPASS  2011   bariatric surgery   HIATAL HERNIA REPAIR     IVC filter     recurrent DVT  KNEE ARTHROTOMY  1998   MASS EXCISION Right 11/24/2016   Procedure: EXCISION RIGHT AXILLARY LESION AND CHEST WALL LESION;  Surgeon: Johnathan Hausen, MD;  Location: [facility];  Service: General;  Laterality: Right;   MASTECTOMY     Right   US ECHOCARDIOGRAPHY  10/27/08   EF 55-60%  :   Current Outpatient Medications:    acarbose (PRECOSE) 50 MG tablet, 1 tablet at start of breakfast and lunch and half tablet at dinnertime, Disp: 270 tablet, Rfl: 3   albuterol (PROAIR HFA) 108 (90 Base) MCG/ACT inhaler, Inhale 2 puffs into the lungs every 6 (six) hours as needed for wheezing or shortness of breath., Disp: 24 g, Rfl: 3   baclofen (LIORESAL) 20 MG tablet, TAKE 1 TABLET THREE TIMES A DAY (UPDATED PRESCRIPTION), Disp: 270 tablet, Rfl: 1   Blood Glucose Monitoring Suppl (FREESTYLE FREEDOM LITE) w/Device KIT, Use as  directed to check blood sugar once daily., Disp: 1 kit, Rfl: 0   Calcium Carbonate-Vitamin D 600-400 MG-UNIT tablet, Take 1 tablet by mouth daily., Disp: , Rfl:    clobetasol cream (TEMOVATE) 7.32 %, APPLY 1 APPLICATION TOPICALLY TWICE A DAY, Disp: 120 g, Rfl: 5   diazepam (DIASTAT ACUDIAL) 10 MG GEL, Place 5 mg rectally once as needed (cramping)., Disp: 2 Package, Rfl: 0   enoxaparin (LOVENOX) 150 MG/ML injection, Inject 1 mL (150 mg total) into the skin daily. USE AS DIRECTED BY ANTICOAGULATION CLINIC, Disp: 9 mL, Rfl: 0   ferrous sulfate 325 (65 FE) MG tablet, Take 325 mg by mouth daily., Disp: , Rfl:    fluticasone (FLONASE) 50 MCG/ACT nasal spray, Place 2 sprays into both nostrils daily., Disp: 48 g, Rfl: 3   fluticasone-salmeterol (ADVAIR DISKUS) 100-50 MCG/ACT AEPB, USE 1 INHALATION EVERY 12 HOURS, RINSE MOUTH AFTER USING, Disp: 180 each, Rfl: 3   FREESTYLE LITE test strip, USE AS INSTRUCTED TO CHECK BLOOD SUGAR ONCE DAILY, Disp: 100 strip, Rfl: 3   furosemide (LASIX) 80 MG tablet, Take 1 tablet (80 mg total) by mouth 2 (two) times daily., Disp: 180 tablet, Rfl: 3   gabapentin (NEURONTIN) 600 MG tablet, TAKE 1 TABLET THREE TIMES A DAY (DOSE CHANGE), Disp: 270 tablet, Rfl: 1   HYDROcodone-acetaminophen (NORCO) 5-325 MG tablet, Take 1 tablet by mouth 2 (two) times daily as needed for moderate pain. (Patient taking differently: Take 0.5-1 tablets by mouth 3 (three) times daily as needed for moderate pain or severe pain.), Disp: 50 tablet, Rfl: 0   Lancets (FREESTYLE) lancets, USE AS INSTRUCTED TO CHECK BLOOD SUGAR ONCE DAILY, Disp: 100 each, Rfl: 1   levothyroxine (SYNTHROID) 100 MCG tablet, TAKE 1 TABLET DAILY BEFORE BREAKFAST (NEED WHEN 88 MCG OUT), Disp: 90 tablet, Rfl: 1   Magnesium Cl-Calcium Carbonate (SLOW-MAG PO), Take 500 mg by mouth at bedtime. , Disp: , Rfl:    methylcellulose (ARTIFICIAL TEARS) 1 % ophthalmic solution, Place 1 drop into both eyes as needed. Dry eyes, Disp: , Rfl:     omeprazole (PRILOSEC) 40 MG capsule, Take 1 capsule (40 mg total) by mouth in the morning and at bedtime., Disp: 60 capsule, Rfl: 1   potassium chloride SA (KLOR-CON M) 20 MEQ tablet, Take 2 tablets (40 mEq total) by mouth 2 (two) times daily., Disp: 360 tablet, Rfl: 3   pravastatin (PRAVACHOL) 10 MG tablet, TAKE 1 TABLET DAILY, Disp: 90 tablet, Rfl: 3   spironolactone (ALDACTONE) 100 MG tablet, Take 1 tablet (100 mg total) by mouth daily., Disp: 30  tablet, Rfl: 3   temazepam (RESTORIL) 15 MG capsule, 1-2 for sleep as needed, Disp: 90 capsule, Rfl: 1   vitamin B-12 (CYANOCOBALAMIN) 500 MCG tablet, Take 500 mcg by mouth daily., Disp: , Rfl:    warfarin (COUMADIN) 5 MG tablet, TAKE 2 TABLETS DAILY EXCEPT TAKE 1 TABLET ON WEDNESDAY OR TAKE AS DIRECTED BY ANTICOAGULATION CLINIC, Disp: 205 tablet, Rfl: 3:   Allergies  Allergen Reactions   Adhesive [Tape] Rash  :   Family History  Problem Relation Age of Onset   Sudden death Mother        car accident   Cancer Father 55       lung   Cancer Sister 67       lung cancer   Asthma Daughter    Colon cancer Neg Hx    Stomach cancer Neg Hx    Esophageal cancer Neg Hx    Colon polyps Neg Hx   :   Social History   Socioeconomic History   Marital status: Widowed    Spouse name: Not on file   Number of children: 2   Years of education: Not on file   Highest education level: Not on file  Occupational History   Occupation: retired  Tobacco Use   Smoking status: Former    Packs/day: 0.50    Years: 10.00    Total pack years: 5.00    Types: Cigarettes    Quit date: 07/31/1978    Years since quitting: 43.6   Smokeless tobacco: Never  Vaping Use   Vaping Use: Never used  Substance and Sexual Activity   Alcohol use: No   Drug use: No   Sexual activity: Not Currently  Other Topics Concern   Not on file  Social History Narrative   Widowed with 2 children,  Lives in a one story home and her son and his wife recently moved in with her.   Retired Freight forwarder for [employer].  Education: some college.    Social Determinants of Health   Financial Resource Strain: Low Risk  (10/11/2021)   Overall Financial Resource Strain (CARDIA)    Difficulty of Paying Living Expenses: Not hard at all  Food Insecurity: No Food Insecurity (10/11/2021)   Hunger Vital Sign    Worried About Running Out of Food in the Last Year: Never true    Ran Out of Food in the Last Year: Never true  Transportation Needs: No Transportation Needs (10/11/2021)   PRAPARE - Hydrologist (Medical): No    Lack of Transportation (Non-Medical): No  Physical Activity: Inactive (10/11/2021)   Exercise Vital Sign    Days of Exercise per Week: 0 days    Minutes of Exercise per Session: 0 min  Stress: No Stress Concern Present (10/11/2021)   [place]    Feeling of Stress : Not at all  Social Connections: Moderately Isolated (10/05/2020)   Social Connection and Isolation Panel [NHANES]    Frequency of Communication with Friends and Family: More than three times a week    Frequency of Social Gatherings with Friends and Family: Twice a week    Attends Religious Services: More than 4 times per year    Active Member of [employer] or Organizations: No    Attends Archivist Meetings: Never    Marital Status: Widowed  Intimate Partner Violence: Not At Risk (10/05/2020)   Humiliation, Afraid, Rape, and Kick questionnaire  Fear of Current or Ex-Partner: No    Emotionally Abused: No    Physically Abused: No    Sexually Abused: No  :  Pertinent items are noted in HPI.  Exam: Blood pressure 124/70, pulse 63, temperature 98.1 F (36.7 C), temperature source Temporal, resp. rate 17, height _0  (1.702 m), weight 207 lb 8 oz (94.1 kg), SpO2 100 %. ECOG 1 General appearance: alert and cooperative appeared without distress. Head: atraumatic without any abnormalities. Eyes:  conjunctivae/corneas clear. PERRL.  Sclera anicteric. Throat: lips, mucosa, and tongue normal; without oral thrush or ulcers. Resp: clear to auscultation bilaterally without rhonchi, wheezes or dullness to percussion. Cardio: regular rate and rhythm, S1, S2 normal, no murmur, click, rub or gallop.  Bilateral lower extremity edema noted. GI: soft, non-tender; bowel sounds normal; no masses,  no organomegaly Skin: Skin color, texture, turgor normal. No rashes or lesions Lymph nodes: Cervical, supraclavicular, and axillary nodes normal. Neurologic: Grossly normal without any motor, sensory or deep tendon reflexes. Musculoskeletal: No joint deformity or effusion.    Assessment and Plan:   Kelli Martinez with:  1.  Mild anemia with a hemoglobin of 11.7 detected on February 14, 2022.  She has normal MCV and overall elevated ferritin, normal iron and elevated TIBC.  The differential diagnosis of these findings were discussed at this time.  Her anemia is rather mild and chronic in nature.  Her hemoglobin have ranged between 11 and 12 for at least the last 20 years.  Review of her CBCs previously confirmed these findings.  She could have an element of iron deficiency at this time with low saturation.  Her ferritin elevation is likely an acute phase reactant which could account for part of the elevation of ferritin.  At this time, I do not recommend any specific intervention.  I do not see any need for a bone marrow biopsy I recommended continued observation and surveillance.  I will update her iron studies in 6 months and recommend replacement if needed.  2.  Elevated liver function test, positive ANA and overall fatigue tiredness: Unclear etiology at this time.  I do not believe this is related to a hematological disorder.  3.  Follow-up: I will repeat laboratory testing in 6 months.  45  minutes were dedicated to this visit. The time was spent on reviewing laboratory data, discussing treatment  options, discussing differential diagnosis and answering questions regarding future plan.     A copy of this consult has been forwarded to the requesting physician.

## 2022-04-06 ENCOUNTER — Other Ambulatory Visit: Payer: Self-pay

## 2022-04-06 ENCOUNTER — Telehealth: Payer: Self-pay | Admitting: Internal Medicine

## 2022-04-06 MED ORDER — OMEPRAZOLE 40 MG PO CPDR: 40.0000 mg | DELAYED_RELEASE_CAPSULE | Freq: Two times a day (BID) | ORAL | 0 refills | Status: DC

## 2022-04-06 NOTE — Telephone Encounter (Signed)
Inbound call from patient stating that omeprazole was sent to [facility], but it should have been sent to [facility]. Patient is requesting it to be sent, please advise.

## 2022-04-06 NOTE — Telephone Encounter (Signed)
Spoke with the patient. Advised of new Rx sent to [facility]. Assisted her with scheduling her follow up appointment.

## 2022-04-18 ENCOUNTER — Ambulatory Visit (INDEPENDENT_AMBULATORY_CARE_PROVIDER_SITE_OTHER): Payer: Medicare Other

## 2022-04-18 DIAGNOSIS — Z7901 Long term (current) use of anticoagulants: Secondary | ICD-10-CM

## 2022-04-18 LAB — POCT INR: INR: 1.9 — AB (ref 2.0–3.0)

## 2022-04-18 MED ORDER — WARFARIN SODIUM 5 MG PO TABS: ORAL_TABLET | ORAL | 3 refills | Status: DC

## 2022-04-18 NOTE — Patient Instructions (Signed)
Increase dose today to take 3 tablets, then continue to take 2 tablets daily except 1 tablet on Wednesdays and Saturdays. Recheck in 3 weeks.

## 2022-04-18 NOTE — Progress Notes (Signed)
Increase dose today to take 3 tablets, then continue to take 2 tablets daily except 1 tablet on Wednesdays and Saturdays. Recheck in 3 weeks.

## 2022-05-02 ENCOUNTER — Other Ambulatory Visit: Payer: Self-pay | Admitting: Internal Medicine

## 2022-05-02 ENCOUNTER — Other Ambulatory Visit: Payer: Self-pay | Admitting: Family Medicine

## 2022-05-04 ENCOUNTER — Other Ambulatory Visit (INDEPENDENT_AMBULATORY_CARE_PROVIDER_SITE_OTHER): Payer: Medicare Other

## 2022-05-04 DIAGNOSIS — E063 Autoimmune thyroiditis: Secondary | ICD-10-CM

## 2022-05-04 DIAGNOSIS — E7800 Pure hypercholesterolemia, unspecified: Secondary | ICD-10-CM

## 2022-05-04 DIAGNOSIS — K912 Postsurgical malabsorption, not elsewhere classified: Secondary | ICD-10-CM | POA: Diagnosis not present

## 2022-05-04 LAB — BASIC METABOLIC PANEL
BUN: 25 mg/dL — ABNORMAL HIGH (ref 6–23)
CO2: 34 mEq/L — ABNORMAL HIGH (ref 19–32)
Calcium: 9.9 mg/dL (ref 8.4–10.5)
Chloride: 100 mEq/L (ref 96–112)
Creatinine, Ser: 1.19 mg/dL (ref 0.40–1.20)
GFR: 45.08 mL/min — ABNORMAL LOW (ref >60.00)
Glucose, Bld: 119 mg/dL — ABNORMAL HIGH (ref 70–99)
Potassium: 4.1 mEq/L (ref 3.5–5.1)
Sodium: 139 mEq/L (ref 135–145)

## 2022-05-04 LAB — LIPID PANEL
Cholesterol: 210 mg/dL — ABNORMAL HIGH (ref 0–200)
HDL: 104.3 mg/dL (ref >39.00)
LDL Cholesterol: 94 mg/dL (ref 0–99)
NonHDL: 105.36
Total CHOL/HDL Ratio: 2
Triglycerides: 55 mg/dL (ref 0.0–149.0)
VLDL: 11 mg/dL (ref 0.0–40.0)

## 2022-05-04 LAB — T4, FREE: Free T4: 1.01 ng/dL (ref 0.60–1.60)

## 2022-05-04 LAB — HEMOGLOBIN A1C: Hgb A1c MFr Bld: 6.8 % — ABNORMAL HIGH (ref 4.6–6.5)

## 2022-05-04 LAB — TSH: TSH: 2.65 u[IU]/mL (ref 0.35–5.50)

## 2022-05-09 ENCOUNTER — Encounter: Payer: Self-pay | Admitting: Endocrinology

## 2022-05-09 ENCOUNTER — Ambulatory Visit (INDEPENDENT_AMBULATORY_CARE_PROVIDER_SITE_OTHER): Payer: Medicare Other | Admitting: Endocrinology

## 2022-05-09 ENCOUNTER — Ambulatory Visit (INDEPENDENT_AMBULATORY_CARE_PROVIDER_SITE_OTHER): Payer: Medicare Other

## 2022-05-09 VITALS — BP 118/62 | HR 73 | Ht 67.0 in | Wt 208.0 lb

## 2022-05-09 DIAGNOSIS — Z7901 Long term (current) use of anticoagulants: Secondary | ICD-10-CM

## 2022-05-09 DIAGNOSIS — E063 Autoimmune thyroiditis: Secondary | ICD-10-CM | POA: Diagnosis not present

## 2022-05-09 DIAGNOSIS — K912 Postsurgical malabsorption, not elsewhere classified: Secondary | ICD-10-CM

## 2022-05-09 DIAGNOSIS — E7800 Pure hypercholesterolemia, unspecified: Secondary | ICD-10-CM | POA: Diagnosis not present

## 2022-05-09 DIAGNOSIS — Z23 Encounter for immunization: Secondary | ICD-10-CM

## 2022-05-09 DIAGNOSIS — E1142 Type 2 diabetes mellitus with diabetic polyneuropathy: Secondary | ICD-10-CM | POA: Diagnosis not present

## 2022-05-09 LAB — POCT INR: INR: 2.9 (ref 2.0–3.0)

## 2022-05-09 MED ORDER — VITAMIN D (ERGOCALCIFEROL) 50000 UNITS PO CAPS: 1.0000 | ORAL_CAPSULE | ORAL | 3 refills | Status: DC

## 2022-05-09 MED ORDER — ACARBOSE 50 MG PO TABS: ORAL_TABLET | ORAL | 3 refills | Status: DC

## 2022-05-09 NOTE — Patient Instructions (Signed)
Continue to take 2 tablets daily except 1 tablet on Wedesdays and Saturdays. Recheck in 6 weeks.

## 2022-05-09 NOTE — Progress Notes (Signed)
Patient ID: Kelli Martinez, female   DOB: 21-Oct-1945, 76 y.o.   MRN: [id number]    Chief complaint: Followup of various chronic problems    History of Present Illness:  She is here for follow-up:  1.  Postprandial hypoglycemia related to dumping syndrome   She has had postprandial hypoglycemia starting a couple of years after her gastric bypass surgery Her symptoms of blood sugars are mostly a feeling of significant weakness, some shakiness.  She will use glucose tablets or juice in order to relieve the symptoms, recently she thinks that 1 or 2 glucose tablets are usually adequate She has been to the dietitian and has been instructed on balanced low-fat meals with enough protein consistently and restricting carbohydrates including fruits   Recent history:  She has been treated with acarbose 50 mg before meals She did try verapamil previously without benefit  She is taking her Precose as directed before meals, taking 50 mg before breakfast, 50 mg tablet at lunch and 50 mg every other day at dinnertime   She is that she has occasional low blood sugars before lunch or dinner if she is late for her meal This will be usually more than 4 hours later She is only eating yogurt in the morning for breakfast again  Lowest readings recently was 60 at about 5 PM  No overnight hypoglycemia  Metformin was stopped previously because of hypoglycemia tendency   2.  SWELLING of the legs:  She has had significant problems with her legs and feet chronically She has been followed by PCP and by cardiologist  Taking Aldactone 100 mg daily  Also taking Lasix 80 mg and managed by PCP  Usually she does not like to wear compression stockings because of discomfort and difficulty putting them on  Renal function and potassium :  Lab Results  Component Value Date   K 4.1 05/04/2022   Lab Results  Component Value Date   CREATININE 1.19 05/04/2022   CREATININE 1.26 (H)  01/03/2022   CREATININE 1.16 (H) 12/14/2021     PROBLEM 3:  DIABETES:  This has been mild and well controlled since her gastric bypass surgery  The A1c usually higher than expected for her blood sugars, now 6.8 compared to 7.1  Current management, blood sugar patterns: She has only been on acarbose for treatment to prevent hypoglycemia  Blood sugar readings are being monitored infrequently As recently only 1 fasting reading of 104 and highest reading 159 after dinner She thinks she is watching her portions well and only occasionally with more carbohydrates will have a higher reading Weight is about the same Has not been able to exercise   She was previously told to stop her Metformin  because of tendency to hypoglycemia symptoms  Glucose readings from monitor download as above, recent AVERAGE 101    Wt Readings from Last 3 Encounters:  05/09/22 208 lb (94.3 kg)  03/31/22 207 lb 8 oz (94.1 kg)  03/03/22 205 lb (93 kg)    Lab Results  Component Value Date   HGBA1C 6.8 (H) 05/04/2022   HGBA1C 7.1 (H) 01/03/2022   HGBA1C 6.9 (H) 12/14/2021   Lab Results  Component Value Date   MICROALBUR <0.7 04/26/2021   [place] 94 05/04/2022   CREATININE 1.19 05/04/2022   Lab Results  Component Value Date   HGB 11.7 (L) 02/14/2022      Other active problems: see review of systems     Allergies as of 05/09/2022  Reactions   Adhesive [tape] Rash        Medication List        Accurate as of May 09, 2022  9:36 AM. If you have any questions, ask your nurse or doctor.          acarbose 50 MG tablet Commonly known as: PRECOSE 1 tablet at start of breakfast and lunch and half tablet at dinnertime   albuterol 108 (90 Base) MCG/ACT inhaler Commonly known as: ProAir HFA Inhale 2 puffs into the lungs every 6 (six) hours as needed for wheezing or shortness of breath.   baclofen 20 MG tablet Commonly known as: LIORESAL TAKE 1 TABLET THREE TIMES A DAY (UPDATED  PRESCRIPTION)   Calcium Carbonate-Vitamin D 600-400 MG-UNIT tablet Take 1 tablet by mouth daily.   clobetasol cream 0.05 % Commonly known as: TEMOVATE APPLY 1 APPLICATION TOPICALLY TWICE A DAY   diazepam 10 MG Gel Commonly known as: DIASTAT ACUDIAL Place 5 mg rectally once as needed (cramping).   enoxaparin 150 MG/ML injection Commonly known as: LOVENOX Inject 1 mL (150 mg total) into the skin daily. USE AS DIRECTED BY ANTICOAGULATION CLINIC   ferrous sulfate 325 (65 FE) MG tablet Take 325 mg by mouth daily.   fluticasone 50 MCG/ACT nasal spray Commonly known as: FLONASE Place 2 sprays into both nostrils daily.   fluticasone-salmeterol 100-50 MCG/ACT Aepb Commonly known as: Advair Diskus USE 1 INHALATION EVERY 12 HOURS, RINSE MOUTH AFTER USING   FreeStyle Freedom Lite w/Device Kit Use as directed to check blood sugar once daily.   freestyle lancets USE AS INSTRUCTED TO CHECK BLOOD SUGAR ONCE DAILY   FREESTYLE LITE test strip Generic drug: glucose blood USE AS INSTRUCTED TO CHECK BLOOD SUGAR ONCE DAILY   furosemide 80 MG tablet Commonly known as: LASIX Take 1 tablet (80 mg total) by mouth 2 (two) times daily.   gabapentin 600 MG tablet Commonly known as: NEURONTIN TAKE 1 TABLET THREE TIMES A DAY (DOSE CHANGE)   HYDROcodone-acetaminophen 5-325 MG tablet Commonly known as: Norco Take 1 tablet by mouth 2 (two) times daily as needed for moderate pain. What changed:  how much to take when to take this reasons to take this   levothyroxine 100 MCG tablet Commonly known as: SYNTHROID TAKE 1 TABLET DAILY BEFORE BREAKFAST (NEED WHEN 88 MCG OUT)   methylcellulose 1 % ophthalmic solution Commonly known as: ARTIFICIAL TEARS Place 1 drop into both eyes as needed. Dry eyes   omeprazole 40 MG capsule Commonly known as: PRILOSEC Take 1 capsule (40 mg total) by mouth in the morning and at bedtime.   potassium chloride SA 20 MEQ tablet Commonly known as: KLOR-CON  M Take 2 tablets (40 mEq total) by mouth 2 (two) times daily.   pravastatin 10 MG tablet Commonly known as: PRAVACHOL TAKE 1 TABLET DAILY   SLOW-MAG PO Take 500 mg by mouth at bedtime.   spironolactone 100 MG tablet Commonly known as: ALDACTONE TAKE 1 TABLET DAILY   temazepam 15 MG capsule Commonly known as: RESTORIL 1-2 for sleep as needed   vitamin B-12 500 MCG tablet Commonly known as: CYANOCOBALAMIN Take 500 mcg by mouth daily.   Vitamin D (Ergocalciferol) 50000 units Caps Take 1 capsule by mouth once a week.   warfarin 5 MG tablet Commonly known as: COUMADIN Take as directed by the anticoagulation clinic. If you are unsure how to take this medication, talk to your nurse or doctor. Original instructions: TAKE 2 TABLETS DAILY EXCEPT TAKE  1 TABLET ON WEDNESDAYS AND SATURDAYS OR TAKE AS DIRECTED BY ANTICOAGULATION CLINIC        Allergies:  Allergies  Allergen Reactions   Adhesive [Tape] Rash    Past Medical History:  Diagnosis Date   Allergy    Anemia    Arthritis    Asthma    Back pain, chronic    GETS INJECTIONS IN BACK   Blind right eye    hemorrhage   Blood transfusion    Cancer ([place])    breast 1994   Clotting disorder ([employer])    Diabetes mellitus without complication ([place])    takes precose   Elevated liver enzymes    Gallstones    GERD (gastroesophageal reflux disease)    HX: breast cancer    Hyperlipidemia    Hypertension    Hypothyroid    Lymphedema of arm    RT   Morbid obesity ([facility])    OSA (obstructive sleep apnea)    has not used in 2 years-lost weight   Osteoporosis    Peripheral neuropathy    on gabapentin   PONV (postoperative nausea and vomiting)    Psoriasis    Pulmonary embolism ([place]) 1998 / 1994 /1968   Syncope and collapse 2011   due to anemia    Past Surgical History:  Procedure Laterality Date   ABDOMINAL HYSTERECTOMY     BONE MARROW TRANSPLANT  1994   CARDIOVASCULAR STRESS TEST  05/23/2005   EF 53%   carpal  tunnell     bil   cataracts     CHOLECYSTECTOMY  05/10/2012   Procedure: LAPAROSCOPIC CHOLECYSTECTOMY WITH INTRAOPERATIVE CHOLANGIOGRAM;  Surgeon: Pedro Earls, MD;  Location: [facility];  Service: General;  Laterality: N/A;  Laparoscopic Cholecystectomy with Intraoperative Cholangiogram   GASTRIC BYPASS  2011   bariatric surgery   HIATAL HERNIA REPAIR     IVC filter     recurrent DVT   KNEE ARTHROTOMY  1998   MASS EXCISION Right 11/24/2016   Procedure: EXCISION RIGHT AXILLARY LESION AND CHEST WALL LESION;  Surgeon: Johnathan Hausen, MD;  Location: [facility];  Service: General;  Laterality: Right;   MASTECTOMY     Right   US ECHOCARDIOGRAPHY  10/27/08   EF 55-60%    Family History  Problem Relation Age of Onset   Sudden death Mother        car accident   Cancer Father 71       lung   Cancer Sister 60       lung cancer   Asthma Daughter    Colon cancer Neg Hx    Stomach cancer Neg Hx    Esophageal cancer Neg Hx    Colon polyps Neg Hx     Social History:  reports that she quit smoking about 43 years ago. Her smoking use included cigarettes. She has a 5.00 pack-year smoking history. She has never used smokeless tobacco. She reports that she does not drink alcohol and does not use drugs.  Review of Systems    ABNORMAL liver functions:  ALT is high and was told by gastroenterologist that it is from acarbose However has been on acarbose for several years  Previously has had abnormal high ALT readings before transiently and no etiology found Not related to statins   Lab Results  Component Value Date   ALT 52 (H) 01/03/2022   ALT 25 01/19/2016     Neuropathy with persistent burning, mild numbness and lower leg  pain:  She gets relief with nortriptyline and gabapentin  MUSCLE cramps/spasms:  These are treated with 10 mg baclofen during the day twice daily and 20 mg at bedtime She also uses tonic water, mustard and magnesium supplements  No history of  hypokalemia  History of mild hypothyroidism: Has been present for several years and minimally symptomatic usually  Current dose of levothyroxine is 100 mcg She has taken her calcium separately and TSH is better  TSH history:   Lab Results  Component Value Date   TSH 2.65 05/04/2022   TSH 5.34 01/03/2022   TSH 2.330 12/14/2021   FREET4 1.01 05/04/2022   FREET4 1.05 07/08/2021   FREET4 0.92 01/21/2021     Hypercholesterolemia: LDL has been at target with pravastatin 38m  Labs as below  Lipid levels:  Lab Results  Component Value Date   CHOL 210 (H) 05/04/2022   HDL 104.30 05/04/2022   LDLCALC 94 05/04/2022   TRIG 55.0 05/04/2022   CHOLHDL 2 05/04/2022   Lab Results  Component Value Date   ALT 52 (H) 01/03/2022   ALT 25 01/19/2016    No history of hypertension  BP Readings from Last 3 Encounters:  05/09/22 118/62  03/31/22 124/70  03/03/22 (!) 159/61     LABS:  Anti-coag visit on 05/09/2022  Component Date Value Ref Range Status   INR 05/09/2022 2.9  2.0 - 3.0 Final  Lab on 05/04/2022  Component Date Value Ref Range Status   Cholesterol 05/04/2022 210 (H)  0 - 200 mg/dL Final   ATP III Classification       Desirable:  < 200 mg/dL               Borderline High:  200 - 239 mg/dL          High:  > = 240 mg/dL   Triglycerides 05/04/2022 55.0  0.0 - 149.0 mg/dL Final   Normal:  <150 mg/dLBorderline High:  150 - 199 mg/dL   HDL 05/04/2022 104.30  >39.00 mg/dL Final   VLDL 05/04/2022 11.0  0.0 - 40.0 mg/dL Final   LDL Cholesterol 05/04/2022 94  0 - 99 mg/dL Final   Total CHOL/HDL Ratio 05/04/2022 2   Final                  Men          Women1/2 Average Risk     3.4          3.3Average Risk          5.0          4.42X Average Risk          9.6          7.13X Average Risk          15.0          11.0                       NonHDL 05/04/2022 105.36   Final   NOTE:  Non-HDL goal should be 30 mg/dL higher than patient's LDL goal (i.e. LDL goal of < 70 mg/dL, would  have non-HDL goal of < 100 mg/dL)   Free T4 05/04/2022 1.01  0.60 - 1.60 ng/dL Final   Comment: Specimens from patients who are undergoing biotin therapy and /or ingesting biotin supplements may contain high levels of biotin.  The higher biotin concentration in these specimens interferes with this Free T4 assay.  Specimens that contain high levels  of biotin may cause false high results for this Free T4 assay.  Please interpret results in light of the total clinical presentation of the patient.     TSH 05/04/2022 2.65  0.35 - 5.50 uIU/mL Final   Hgb A1c MFr Bld 05/04/2022 6.8 (H)  4.6 - 6.5 % Final   Glycemic Control Guidelines for People with Diabetes:Non Diabetic:  <6%Goal of Therapy: <7%Additional Action Suggested:  >8%    Sodium 05/04/2022 139  135 - 145 mEq/L Final   Potassium 05/04/2022 4.1  3.5 - 5.1 mEq/L Final   Chloride 05/04/2022 100  96 - 112 mEq/L Final   CO2 05/04/2022 34 (H)  19 - 32 mEq/L Final   Glucose, Bld 05/04/2022 119 (H)  70 - 99 mg/dL Final   BUN 05/04/2022 25 (H)  6 - 23 mg/dL Final   Creatinine, Ser 05/04/2022 1.19  0.40 - 1.20 mg/dL Final   GFR 05/04/2022 45.08 (L)  >60.00 mL/min Final   Calculated using the CKD-EPI Creatinine Equation (2021)   Calcium 05/04/2022 9.9  8.4 - 10.5 mg/dL Final  Anti-coag visit on 04/18/2022  Component Date Value Ref Range Status   INR 04/18/2022 1.9 (A)  2.0 - 3.0 Final     EXAM:  BP 118/62   Pulse 73   Ht _0  (1.702 m)   Wt 208 lb (94.3 kg)   BMI 32.58 kg/m    Assessment/Plan:   1.  HYPOTHYROIDISM: TSH is normal she will continue same dose and avoid taking calcium at the same time her levothyroxine   2. Reactive hypoglycemia secondary to gastrointestinal surgery:   She is still getting periodic hypoglycemia with mild symptoms She is again likely not getting enough protein with every meal especially breakfast Symptoms usually occur when she is late for lunch She needs to eat a snack between meals or drink diabetic  boost Continue acarbose with each meal as before   3. Mild diabetes: A1c 6.8 This is likely higher than expected for her home blood sugars with only minimal increase in fasting and postprandial readings and lab glucose 119 Since A1c is improving will not consider adding metformin again  4.  Increased ALT: This is variable and may be related to fatty liver  She will discuss with gastroenterologist at upcoming visit, this is possibly  related to acarbose  5.  No recent issues with hypokalemia or change in potassium with Aldactone  6.:  Lipids well controlled with LDL 94  There are no Patient Instructions on file for this visit.  Elayne Snare 05/09/2022, 9:36 AM

## 2022-05-09 NOTE — Progress Notes (Signed)
Continue to take 2 tablets daily except 1 tablet on Wedesdays and Saturdays. Recheck in 6 weeks per pt request.   Fluad administered per pt request in left deltoid. Tolerated well.

## 2022-05-17 ENCOUNTER — Telehealth: Payer: Self-pay

## 2022-05-17 ENCOUNTER — Telehealth (INDEPENDENT_AMBULATORY_CARE_PROVIDER_SITE_OTHER): Payer: Medicare Other | Admitting: Internal Medicine

## 2022-05-17 ENCOUNTER — Encounter: Payer: Self-pay | Admitting: Internal Medicine

## 2022-05-17 ENCOUNTER — Other Ambulatory Visit (INDEPENDENT_AMBULATORY_CARE_PROVIDER_SITE_OTHER): Payer: Medicare Other

## 2022-05-17 VITALS — BP 118/70 | HR 60 | Ht 67.0 in | Wt 210.1 lb

## 2022-05-17 DIAGNOSIS — R768 Other specified abnormal immunological findings in serum: Secondary | ICD-10-CM | POA: Diagnosis not present

## 2022-05-17 DIAGNOSIS — R7989 Other specified abnormal findings of blood chemistry: Secondary | ICD-10-CM | POA: Diagnosis not present

## 2022-05-17 DIAGNOSIS — R7689 Other specified abnormal immunological findings in serum: Secondary | ICD-10-CM

## 2022-05-17 DIAGNOSIS — Z7901 Long term (current) use of anticoagulants: Secondary | ICD-10-CM

## 2022-05-17 LAB — CBC WITH DIFFERENTIAL/PLATELET
Basophils Absolute: 0 10*3/uL (ref 0.0–0.1)
Basophils Relative: 0.9 % (ref 0.0–3.0)
Eosinophils Absolute: 0.1 10*3/uL (ref 0.0–0.7)
Eosinophils Relative: 1.3 % (ref 0.0–5.0)
HCT: 37.1 % (ref 36.0–46.0)
Hemoglobin: 11.8 g/dL — ABNORMAL LOW (ref 12.0–15.0)
Lymphocytes Relative: 22.4 % (ref 12.0–46.0)
Lymphs Abs: 1.2 10*3/uL (ref 0.7–4.0)
MCHC: 31.8 g/dL (ref 30.0–36.0)
MCV: 86.4 fl (ref 78.0–100.0)
Monocytes Absolute: 0.2 10*3/uL (ref 0.1–1.0)
Monocytes Relative: 4.7 % (ref 3.0–12.0)
Neutro Abs: 3.7 10*3/uL (ref 1.4–7.7)
Neutrophils Relative %: 70.7 % (ref 43.0–77.0)
Platelets: 201 10*3/uL (ref 150.0–400.0)
RBC: 4.3 Mil/uL (ref 3.87–5.11)
RDW: 14.4 % (ref 11.5–15.5)
WBC: 5.3 10*3/uL (ref 4.0–10.5)

## 2022-05-17 LAB — COMPREHENSIVE METABOLIC PANEL
ALT: 37 U/L — ABNORMAL HIGH (ref 0–35)
AST: 30 U/L (ref 0–37)
Albumin: 4.2 g/dL (ref 3.5–5.2)
Alkaline Phosphatase: 91 U/L (ref 39–117)
BUN: 21 mg/dL (ref 6–23)
CO2: 30 mEq/L (ref 19–32)
Calcium: 9.9 mg/dL (ref 8.4–10.5)
Chloride: 101 mEq/L (ref 96–112)
Creatinine, Ser: 0.99 mg/dL (ref 0.40–1.20)
GFR: 56.2 mL/min — ABNORMAL LOW (ref >60.00)
Glucose, Bld: 102 mg/dL — ABNORMAL HIGH (ref 70–99)
Potassium: 4 mEq/L (ref 3.5–5.1)
Sodium: 139 mEq/L (ref 135–145)
Total Bilirubin: 0.3 mg/dL (ref 0.2–1.2)
Total Protein: 7.5 g/dL (ref 6.0–8.3)

## 2022-05-17 MED ORDER — OMEPRAZOLE 40 MG PO CPDR: 40.0000 mg | DELAYED_RELEASE_CAPSULE | Freq: Two times a day (BID) | ORAL | 0 refills | Status: DC

## 2022-05-17 NOTE — Patient Instructions (Addendum)
_______________________________________________________  If you are age 74 or older, your body mass index should be between 23-30. Your Body mass index is 32.91 kg/m. If this is out of the aforementioned range listed, please consider follow up with your Primary Care Provider.  If you are age 78 or younger, your body mass index should be between 19-25. Your Body mass index is 32.91 kg/m. If this is out of the aformentioned range listed, please consider follow up with your Primary Care Provider.   ________________________________________________________  The [facility] providers would like to encourage you to use [facility] to communicate with providers for non-urgent requests or questions.  Due to long hold times on the telephone, sending your provider a message by [facility] may be a faster and more efficient way to get a response.  Please allow 48 business hours for a response.  Please remember that this is for non-urgent requests.  _______________________________________________________   Dennis Bast have been scheduled for a liver biopsy at [facility] (1st floor of hospital) on 05-29-2022 at 1pm. Please arrive at 11am to your appointment for registration. Make certain not to have anything to eat or drink 6 hours prior to your appointment. Should you need to reschedule your appointment, please contact radiology at [phone].   Your provider has requested that you go to the basement level for lab work before leaving today. Press "B" on the elevator. The lab is located at the first door on the left as you exit the elevator.  Due to recent changes in healthcare laws, you may see the results of your imaging and laboratory studies on MyChart before your provider has had a chance to review them.  We understand that in some cases there may be results that are confusing or concerning to you. Not all laboratory results come back in the same time frame and the provider may be waiting for multiple results  in order to interpret others.  Please give [place] 48 hours in order for your provider to thoroughly review all the results before contacting the office for clarification of your results.   You will be contacted by our office prior to your procedure for directions on holding your Coumadin.  If you do not hear from our office 1 week prior to your scheduled procedure, please call [phone] to discuss.    It was a pleasure to see you today!  Thank you for trusting me with your gastrointestinal care!

## 2022-05-17 NOTE — Telephone Encounter (Signed)
[facility] Pre-operative Risk Assessment     Request for surgical clearance:     Liver biopsy   What type of surgery is being performed?     Liver Biopsy  When is this surgery scheduled?     05-29-2022  What type of clearance is required ?   Pharmacy  Are there any medications that need to be held prior to surgery and how long? Yes. Coumadin, 5 days  Practice name and name of physician performing surgery?      [facility]   What is your office phone and fax number?      Phone- [phone]  [phone]  Anesthesia type (None, local, MAC, general) ?   local

## 2022-05-17 NOTE — Progress Notes (Signed)
Chief Complaint: Hypoglycemia after gastric bypass, elevated LFTs  HPI : 76 year old female with history of asthma, DM, GERD, hypothyroidism, obesity s/p RYGB in 2011, DM, OSA, PE on warfarin, breast cancer presents for follow up of hypoglycemia episodes and elevated LFTs  Interval History: Blood sugars are still somewhat low at night time. She will take some glucose tablets when this happens. Stools are brown currently after she stopped her iron supplements. She is going to the bathroom and is eating well. She is still on PPI BID, which is helping with her reflux control. She has stopped her iron supplementation  Wt Readings from Last 3 Encounters:  05/17/22 210 lb 2 oz (95.3 kg)  05/09/22 208 lb (94.3 kg)  03/31/22 207 lb 8 oz (94.1 kg)   Current Outpatient Medications  Medication Sig Dispense Refill   acarbose (PRECOSE) 50 MG tablet 1 tablet at start of breakfast and lunch and at dinnertime 270 tablet 3   albuterol (PROAIR HFA) 108 (90 Base) MCG/ACT inhaler Inhale 2 puffs into the lungs every 6 (six) hours as needed for wheezing or shortness of breath. 24 g 3   baclofen (LIORESAL) 20 MG tablet TAKE 1 TABLET THREE TIMES A DAY (UPDATED PRESCRIPTION) 270 tablet 3   Blood Glucose Monitoring Suppl (FREESTYLE FREEDOM LITE) w/Device KIT Use as directed to check blood sugar once daily. 1 kit 0   Calcium Carbonate-Vitamin D 600-400 MG-UNIT tablet Take 1 tablet by mouth daily.     clobetasol cream (TEMOVATE) 3.71 % APPLY 1 APPLICATION TOPICALLY TWICE A DAY 120 g 5   fluticasone (FLONASE) 50 MCG/ACT nasal spray Place 2 sprays into both nostrils daily. 48 g 3   fluticasone-salmeterol (ADVAIR DISKUS) 100-50 MCG/ACT AEPB USE 1 INHALATION EVERY 12 HOURS, RINSE MOUTH AFTER USING 180 each 3   FREESTYLE LITE test strip USE AS INSTRUCTED TO CHECK BLOOD SUGAR ONCE DAILY 100 strip 3   gabapentin (NEURONTIN) 600 MG tablet TAKE 1 TABLET THREE TIMES A DAY (DOSE CHANGE) 270 tablet 3   HYDROcodone-acetaminophen  (NORCO) 5-325 MG tablet Take 1 tablet by mouth 2 (two) times daily as needed for moderate pain. (Patient taking differently: Take 0.5-1 tablets by mouth 3 (three) times daily as needed for moderate pain or severe pain.) 50 tablet 0   Lancets (FREESTYLE) lancets USE AS INSTRUCTED TO CHECK BLOOD SUGAR ONCE DAILY 100 each 1   levothyroxine (SYNTHROID) 100 MCG tablet TAKE 1 TABLET DAILY BEFORE BREAKFAST (NEED WHEN 88 MCG OUT) 90 tablet 1   Magnesium Cl-Calcium Carbonate (SLOW-MAG PO) Take 500 mg by mouth at bedtime.      methylcellulose (ARTIFICIAL TEARS) 1 % ophthalmic solution Place 1 drop into both eyes as needed. Dry eyes     omeprazole (PRILOSEC) 40 MG capsule Take 1 capsule (40 mg total) by mouth in the morning and at bedtime. 90 capsule 0   potassium chloride SA (KLOR-CON M) 20 MEQ tablet Take 2 tablets (40 mEq total) by mouth 2 (two) times daily. 360 tablet 3   pravastatin (PRAVACHOL) 10 MG tablet TAKE 1 TABLET DAILY 90 tablet 3   spironolactone (ALDACTONE) 100 MG tablet TAKE 1 TABLET DAILY 30 tablet 11   temazepam (RESTORIL) 15 MG capsule 1-2 for sleep as needed 90 capsule 1   vitamin B-12 (CYANOCOBALAMIN) 500 MCG tablet Take 500 mcg by mouth daily.     Vitamin D, Ergocalciferol, 50000 units CAPS Take 1 capsule by mouth once a week. 12 capsule 3   warfarin (COUMADIN) 5  MG tablet TAKE 2 TABLETS DAILY EXCEPT TAKE 1 TABLET ON WEDNESDAYS AND SATURDAYS OR TAKE AS DIRECTED BY ANTICOAGULATION CLINIC 205 tablet 3   furosemide (LASIX) 80 MG tablet Take 1 tablet (80 mg total) by mouth 2 (two) times daily. 180 tablet 3   No current facility-administered medications for this visit.   Physical Exam: BP 118/70 (BP Location: Left Arm, Patient Position: Sitting, Cuff Size: Large)   Pulse 60   Ht 5' 7"  (1.702 m)   Wt 210 lb 2 oz (95.3 kg)   SpO2 97%   BMI 32.91 kg/m  Constitutional: Pleasant,well-developed, female in no acute distress. HEENT: Normocephalic and atraumatic. Conjunctivae are normal. No  scleral icterus. Cardiovascular: Normal rate, regular rhythm.  Pulmonary/chest: Effort normal and breath sounds normal. No wheezing, rales or rhonchi. Abdominal: Soft, nondistended, nontender. Bowel sounds active throughout. There are no masses palpable. No hepatomegaly. Extremities: 2+ BLE edema Neurological: Alert and oriented to person place and time. Skin: Skin is warm and dry. No rashes noted. Psychiatric: Normal mood and affect. Behavior is normal.  Labs 06/2021: CBC nml  Labs 10/2021: Negative Cologuard  Labs 11/2021: HbA1C 6.9%.   Labs 12/2021: CMP with elevated Cr of 1.26 and BUN of 36. AST and ALT are elevated to 69 and 52, respectively. Alk phos and T bili are normal. TSH nml.   Labs 01/2022: CBC with mildly low Hb of 11.7. ANA positive at 1:320. CBC low at 11.7. INR elevated at 2.2. IgG nml. ASMA negative. AMA positive for 101.3. Hep B surface antibody NR. Hep B surface antigen NR. Hep C antibody NR. Hep A antibody positive. IBC ferritin with mildly low iron sat of 11.7%  CT A/P w/contrast 11/05/14: IMPRESSION: Status post right mastectomy with right axillary lymph node dissection. Radiation changes in the right upper lobe. No evidence of recurrent or metastatic disease. Postsurgical changes related to gastric bypass, cholecystectomy, and hysterectomy. IVC filter. Additional ancillary findings as above.  TTE 08/25/21: IMPRESSIONS   1. Left ventricular ejection fraction, by estimation, is 55 to 60%. The left ventricle has normal function. The left ventricle has no regional wall motion abnormalities. Left ventricular diastolic parameters were normal. The average left ventricular global longitudinal strain is -22.9 %. The global longitudinal strain is normal.   2. Right ventricular systolic function is normal. The right ventricular size is normal. There is normal pulmonary artery systolic pressure. The estimated right ventricular systolic pressure is 12.4 mmHg.   3. The mitral  valve is abnormal. Mild mitral valve regurgitation. Moderate mitral annular calcification.   4. The aortic valve is tricuspid. There is moderate calcification of the aortic valve. There is moderate thickening of the aortic valve. Aortic valve regurgitation is not visualized. Aortic valve sclerosis/calcification is present, without any evidence of aortic stenosis.   5. The inferior vena cava is normal in size with <50% respiratory variability, suggesting right atrial pressure of 8 mmHg.   RUQ U/S 02/23/22: IMPRESSION: 1. Increased echogenicity is identified in the liver. This is a nonspecific finding but often seen with hepatic steatosis. Nonspecific causes of hepatocellular disease may result in the same findings. 2. No other abnormalities.  EGD 03/03/22: - Normal esophagus. - Small hiatal hernia. - Gastritis. Biopsied. - Two gastric polyps. Resected and retrieved. - Roux-en-Y gastrojejunostomy with gastrojejunal anastomosis characterized by healthy appearing mucosa. - Normal examined jejunum. Biopsied. Path: 1. Surgical [P], 2nd portion of duodenum - DUODENAL MUCOSA WITH NO SPECIFIC HISTOPATHOLOGIC CHANGES - NEGATIVE FOR INCREASED INTRAEPITHELIAL LYMPHOCYTES OR VILLOUS ARCHITECTURAL  CHANGES 2. Surgical [P], gastric antrum and gastric body - GASTRIC ANTRAL AND OXYNTIC MUCOSA SHOWING REACTIVE GASTROPATHY WITH SUBEPITHELIAL CRYSTALLINE DEPOSITS, CONSISTENT WITH IRON PILL GASTROPATHY - HELICOBACTER PYLORI-LIKE ORGANISMS ARE NOT IDENTIFIED ON ROUTINE H&E STAIN 3. Surgical [P], gastric body, polyp (2) - GASTRIC HYPERPLASTIC POLYP(S) - NEGATIVE FOR INTESTINAL METAPLASIA OR DYSPLASIA  ASSESSMENT AND PLAN: Postprandial hypoglycemia S/p RYGB Iron pill gastritis Gastric hyperplastic polyps GERD Elevated LFTs Patient presents for follow up of postprandial hyperinsulinemic hypoglycemia following RYGB and elevated LFTs. Her hypoglycemic episodes have mostly been controlled with dietary  adjustments and acarbose. Her LFTs have remained mildly elevated, unclear etiology at this time. Her prior work up has revealed a positive AMA and ANA so PBC or AIH could be sources of her elevated LFTs. Alternative causes could include MAFLD (fatty liver seen on ultrasound) or DILI due to acarbose. Will plan for liver biopsy to better delineate the etiology of her liver disease and assess for degree of fibrosis as well. Patient has been doing well on PPI BID therapy so will keep her on this dosage for now.  - Check CBC or CMP - Plan for liver biopsy - Cont PPI BID for now. Refilled. Can consider reducing in the future - Will need to start hepatitis B vaccination in the future - Avoid simple sugars and limit carbohydrate intake to less than 30 grams per meal. Can try to eat smaller meals and aim to eat more protein. - Repeat EGD due in 03/2023 - RTC 2 months  Christia Reading, MD  I spent 42 minutes of time, including in depth chart review, independent review of results as outlined above, communicating results with the patient directly, face-to-face time with the patient, coordinating care, ordering studies and medications as appropriate, and documentation.

## 2022-05-22 NOTE — Telephone Encounter (Signed)
   Name: Kelli Martinez  DOB: Apr 15, 1946  MRN: [id number]  Primary Cardiologist: Janina Mayo, MD   Preoperative team, please contact this patient and set up a phone call appointment for further preoperative risk assessment. Please obtain consent and complete medication review. Thank you for your help.  I confirm that guidance regarding antiplatelet and oral anticoagulation therapy has been completed and, if necessary, noted below.  Per office protocol, patient can hold warfarin for 5 days prior to procedure.    Deberah Pelton, NP 05/22/2022, 3:28 PM [place]

## 2022-05-22 NOTE — Telephone Encounter (Signed)
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    Procedure: liver biopsy   Date of procedure: 05/29/22  CrCl 59 ml/min Platelet count 201  Per office protocol, patient can hold warfarin for 5 days prior to procedure.    Patient WILL need bridging with Lovenox (enoxaparin) around procedure.  I will also send to PCP coumadin clinic as an FYI since they manage patients warfarin.  **This guidance is not considered finalized until pre-operative APP has relayed final recommendations.**

## 2022-05-23 ENCOUNTER — Telehealth: Payer: Self-pay

## 2022-05-23 MED ORDER — ENOXAPARIN SODIUM 150 MG/ML IJ SOSY: 150.0000 mg | PREFILLED_SYRINGE | INTRAMUSCULAR | 0 refills | Status: DC

## 2022-05-23 NOTE — Telephone Encounter (Signed)
Pt has liver biopsy scheduled for 05/29/22 and will be on a Lovenox bridge.   '5mg'$  tablets on hand Pt takes 2 tablets (10 mg) daily except she takes 1 tablet ('5mg'$ ) on Wednesdays and Saturdays.    CrCl: 59 ml/min Wt: 94.3 kg  Lovenox 150 mg once in AM Lovenox bridge details below.   10/25: Take last dose of coumadin. 10/26: No coumadin, No Lovenox 10/27: No coumadin, Lovenox in AM 10/28: No coumadin, Lovenox in AM 10/29 No coumadin, Lovenox in AM before 7 AM   10/30: SURGERY; NO COUMADIN, NO LOVENOX   10/31: Take 3 tablets (15 mg) coumadin, Lovenox once in AM 11/1: Take 1  tablets (7.5 mg) coumadin, Lovenox once in AM 11/2: Take 3 tablets (15 mg) coumadin, Lovenox once in AM 11/3: Take 3 tablets (15 mg) coumadin, Lovenox once in AM 11/4: Take 1 tablet (5 mg) coumadin, Lovenox once in AM 11/5: Take 2 tablets (10 mg) coumadin, Lovenox once in AM 11/6: Take 3 tablets (15 mg) coumadin, Lovenox once in AM   11/7: Recheck INR at [facility] at 9:00 am; no coumadin, no Lovenox until recheck.  Please call Coumadin Clinic at [phone] if you have any questions.   Called pt with above instructions. Instructions also sent to pt via MyChart. Pt verbalized understanding.

## 2022-05-23 NOTE — Telephone Encounter (Signed)
  Patient Consent for Virtual Visit         Kelli Martinez has provided verbal consent on 05/23/2022 for a virtual visit (video or telephone).   CONSENT FOR VIRTUAL VISIT FOR:  Kelli Martinez  By participating in this virtual visit I agree to the following:  I hereby voluntarily request, consent and authorize [facility] and its employed or contracted physicians, physician assistants, nurse practitioners or other licensed health care professionals (the Practitioner), to provide me with telemedicine health care services (the "Services") as deemed necessary by the treating Practitioner. I acknowledge and consent to receive the Services by the Practitioner via telemedicine. I understand that the telemedicine visit will involve communicating with the Practitioner through live audiovisual communication technology and the disclosure of certain medical information by electronic transmission. I acknowledge that I have been given the opportunity to request an in-person assessment or other available alternative prior to the telemedicine visit and am voluntarily participating in the telemedicine visit.  I understand that I have the right to withhold or withdraw my consent to the use of telemedicine in the course of my care at any time, without affecting my right to future care or treatment, and that the Practitioner or I may terminate the telemedicine visit at any time. I understand that I have the right to inspect all information obtained and/or recorded in the course of the telemedicine visit and may receive copies of available information for a reasonable fee.  I understand that some of the potential risks of receiving the Services via telemedicine include:  Delay or interruption in medical evaluation due to technological equipment failure or disruption; Information transmitted may not be sufficient (e.g. poor resolution of images) to allow for appropriate medical decision  making by the Practitioner; and/or  In rare instances, security protocols could fail, causing a breach of personal health information.  Furthermore, I acknowledge that it is my responsibility to provide information about my medical history, conditions and care that is complete and accurate to the best of my ability. I acknowledge that Practitioner's advice, recommendations, and/or decision may be based on factors not within their control, such as incomplete or inaccurate data provided by me or distortions of diagnostic images or specimens that may result from electronic transmissions. I understand that the practice of medicine is not an exact science and that Practitioner makes no warranties or guarantees regarding treatment outcomes. I acknowledge that a copy of this consent can be made available to me via my patient portal ([facility]), or I can request a printed copy by calling the office of [facility].    I understand that my insurance will be billed for this visit.   I have read or had this consent read to me. I understand the contents of this consent, which adequately explains the benefits and risks of the Services being provided via telemedicine.  I have been provided ample opportunity to ask questions regarding this consent and the Services and have had my questions answered to my satisfaction. I give my informed consent for the services to be provided through the use of telemedicine in my medical care

## 2022-05-23 NOTE — Telephone Encounter (Signed)
Spoke with the patient and she was agreeable with telehealth visit. Appointment scheduled, consent given, med list reviewed and voiced understanding.

## 2022-05-25 ENCOUNTER — Encounter (HOSPITAL_BASED_OUTPATIENT_CLINIC_OR_DEPARTMENT_OTHER): Payer: Self-pay

## 2022-05-25 ENCOUNTER — Ambulatory Visit (INDEPENDENT_AMBULATORY_CARE_PROVIDER_SITE_OTHER): Payer: Medicare Other | Admitting: Family

## 2022-05-25 DIAGNOSIS — Z01818 Encounter for other preprocedural examination: Secondary | ICD-10-CM

## 2022-05-25 NOTE — Patient Instructions (Addendum)
Medication Instructions:   10/25: Take last dose of coumadin. 10/26: No coumadin, No Lovenox 10/27: No coumadin, Lovenox in AM 10/28: No coumadin, Lovenox in AM 10/29 No coumadin, Lovenox in AM before 7 AM   10/30: SURGERY; NO COUMADIN, NO LOVENOX   10/31: Take 3 tablets (15 mg) coumadin, Lovenox once in AM 11/1: Take 1  tablets (7.5 mg) coumadin, Lovenox once in AM 11/2: Take 3 tablets (15 mg) coumadin, Lovenox once in AM 11/3: Take 3 tablets (15 mg) coumadin, Lovenox once in AM 11/4: Take 1 tablet (5 mg) coumadin, Lovenox once in AM 11/5: Take 2 tablets (10 mg) coumadin, Lovenox once in AM 11/6: Take 3 tablets (15 mg) coumadin, Lovenox once in AM   11/7: Recheck INR at [facility] at 9:00 am; no coumadin, no Lovenox until recheck.  *If you need a refill on your cardiac medications before your next appointment, please call your pharmacy*   Lab Work/Testing/Procedures: None ordered today.   Follow-Up: At [facility], you and your health needs are our priority.  As part of our continuing mission to provide you with exceptional heart care, we have created designated Provider Care Teams.  These Care Teams include your primary Cardiologist (physician) and Advanced Practice Providers (APPs -  Physician Assistants and Nurse Practitioners) who all work together to provide you with the care you need, when you need it.  We recommend signing up for the patient portal called "MyChart".  Sign up information is provided on this After Visit Summary.  MyChart is used to connect with patients for Virtual Visits (Telemedicine).  Patients are able to view lab/test results, encounter notes, upcoming appointments, etc.  Non-urgent messages can be sent to your provider as well.   To learn more about what you can do with MyChart, go to [email or web address].    Your next appointment:   As scheduled with Dr. Harl Bowie  Other Instructions  Lovenox injection instructions:  [email or web address]

## 2022-05-25 NOTE — Progress Notes (Signed)
Virtual Visit via Telephone Note   Because of Kelli Martinez's co-morbid illnesses, she is at least at moderate risk for complications without adequate follow up.  This format is felt to be most appropriate for this patient at this time.  The patient did not have access to video technology/had technical difficulties with video requiring transitioning to audio format only (telephone).  All issues noted in this document were discussed and addressed.  No physical exam could be performed with this format.  Please refer to the patient's chart for her consent to telehealth for [name].   Date:  05/25/2022   ID:  Kelli Martinez, DOB 1946-01-13, MRN [id number] The patient was identified using 2 identifiers.  Patient Location: Home Provider Location: Home Office   PCP:  Billie Ruddy, MD   [facility] Cardiologist:  Janina Mayo, MD     Evaluation Performed:  Follow-Up Visit  Chief Complaint:  Preop clearance  History of Present Illness:    Kelli Martinez is a 76 y.o. female with DM2, GERD, HTN, OSA on CPAP, DVT/recurrent PE s/p IVD filter ocntinued on coumadin, gastric bypass, breast cancer approx 30 years ago s/p chemo and bone marrow transplant in remission. Last seen by Dr. Harl Bowie 12/14/21. Echo with normal LVEF, no strain and edema thought to be due to lymphedema. She was recommended to follow up in 6 months which is scheduled for 06/12/22.  Presents today for preoperative clearance for liver biopsy scheduled for 05/29/22. She is holding Warfarin for 5 days and bridging with Lovenox. Reports no shortness of breath nor dyspnea on exertion. Notes some sinus congestion which has improved with children's claritin. Reports no chest pain, pressure, or tightness. No orthopnea, PND. Her lower extremity edema is about the same. She does take her Lasix with good response and keeps her feet up. Reports no palpitations.  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 She picked up her Lovenox today, is comfortable with injections, and verbalizes understanding to hold Warfarin.  Past Medical History:  Diagnosis Date   Allergy    Anemia    Arthritis    Asthma    Back pain, chronic    GETS INJECTIONS IN BACK   Blind right eye    hemorrhage   Blood transfusion    Cancer ([place])    breast 1994   Clotting disorder ([place])    Diabetes mellitus without complication ([place])    takes precose   Elevated liver enzymes    Gallstones    GERD (gastroesophageal reflux disease)    HX: breast cancer    Hyperlipidemia    Hypertension    Hypothyroid    Lymphedema of arm    RT   Morbid obesity ([facility])    OSA (obstructive sleep apnea)    has not used in 2 years-lost weight   Osteoporosis    Peripheral neuropathy    on gabapentin   PONV (postoperative nausea and vomiting)    Psoriasis    Pulmonary embolism ([place]) 1998 / 1994 /1968   Syncope and collapse 2011   due to anemia   Past Surgical History:  Procedure Laterality Date   ABDOMINAL HYSTERECTOMY     BONE MARROW TRANSPLANT  1994   CARDIOVASCULAR STRESS TEST  05/23/2005   EF 53%   carpal tunnell     bil   cataracts     CHOLECYSTECTOMY  05/10/2012  Procedure: LAPAROSCOPIC CHOLECYSTECTOMY WITH INTRAOPERATIVE CHOLANGIOGRAM;  Surgeon: Pedro Earls, MD;  Location: [facility];  Service: General;  Laterality: N/A;  Laparoscopic Cholecystectomy with Intraoperative Cholangiogram   GASTRIC BYPASS  2011   bariatric surgery   HIATAL HERNIA REPAIR     IVC filter     recurrent DVT   KNEE ARTHROTOMY  1998   MASS EXCISION Right 11/24/2016   Procedure: EXCISION RIGHT AXILLARY LESION AND CHEST WALL LESION;  Surgeon: Johnathan Hausen, MD;  Location: [facility];  Service: General;  Laterality: Right;   MASTECTOMY     Right   US ECHOCARDIOGRAPHY  10/27/08   EF 55-60%     Current Meds  Medication Sig   acarbose  (PRECOSE) 50 MG tablet 1 tablet at start of breakfast and lunch and at dinnertime   albuterol (PROAIR HFA) 108 (90 Base) MCG/ACT inhaler Inhale 2 puffs into the lungs every 6 (six) hours as needed for wheezing or shortness of breath.   baclofen (LIORESAL) 20 MG tablet TAKE 1 TABLET THREE TIMES A DAY (UPDATED PRESCRIPTION)   Blood Glucose Monitoring Suppl (FREESTYLE FREEDOM LITE) w/Device KIT Use as directed to check blood sugar once daily.   Calcium Carbonate-Vitamin D 600-400 MG-UNIT tablet Take 1 tablet by mouth daily.   clobetasol cream (TEMOVATE) 4.08 % APPLY 1 APPLICATION TOPICALLY TWICE A DAY   [START ON 05/26/2022] enoxaparin (LOVENOX) 150 MG/ML injection Inject 1 mL (150 mg total) into the skin daily for 10 doses.   fluticasone (FLONASE) 50 MCG/ACT nasal spray Place 2 sprays into both nostrils daily.   fluticasone-salmeterol (ADVAIR DISKUS) 100-50 MCG/ACT AEPB USE 1 INHALATION EVERY 12 HOURS, RINSE MOUTH AFTER USING   FREESTYLE LITE test strip USE AS INSTRUCTED TO CHECK BLOOD SUGAR ONCE DAILY   gabapentin (NEURONTIN) 600 MG tablet TAKE 1 TABLET THREE TIMES A DAY (DOSE CHANGE)   HYDROcodone-acetaminophen (NORCO) 5-325 MG tablet Take 1 tablet by mouth 2 (two) times daily as needed for moderate pain. (Patient taking differently: Take 0.5-1 tablets by mouth 3 (three) times daily as needed for moderate pain or severe pain.)   Lancets (FREESTYLE) lancets USE AS INSTRUCTED TO CHECK BLOOD SUGAR ONCE DAILY   levothyroxine (SYNTHROID) 100 MCG tablet TAKE 1 TABLET DAILY BEFORE BREAKFAST (NEED WHEN 88 MCG OUT)   Magnesium Cl-Calcium Carbonate (SLOW-MAG PO) Take 500 mg by mouth at bedtime.    methylcellulose (ARTIFICIAL TEARS) 1 % ophthalmic solution Place 1 drop into both eyes as needed. Dry eyes   omeprazole (PRILOSEC) 40 MG capsule Take 1 capsule (40 mg total) by mouth in the morning and at bedtime.   potassium chloride SA (KLOR-CON M) 20 MEQ tablet Take 2 tablets (40 mEq total) by mouth 2 (two) times  daily.   pravastatin (PRAVACHOL) 10 MG tablet TAKE 1 TABLET DAILY   spironolactone (ALDACTONE) 100 MG tablet TAKE 1 TABLET DAILY   temazepam (RESTORIL) 15 MG capsule 1-2 for sleep as needed   vitamin B-12 (CYANOCOBALAMIN) 500 MCG tablet Take 500 mcg by mouth every other day.   Vitamin D, Ergocalciferol, 50000 units CAPS Take 1 capsule by mouth once a week.   warfarin (COUMADIN) 5 MG tablet TAKE 2 TABLETS DAILY EXCEPT TAKE 1 TABLET ON WEDNESDAYS AND SATURDAYS OR TAKE AS DIRECTED BY ANTICOAGULATION CLINIC     Allergies:   Adhesive [tape]   Social History   Tobacco Use   Smoking status: Former    Packs/day: 0.50    Years: 10.00    Total pack  years: 5.00    Types: Cigarettes    Quit date: 07/31/1978    Years since quitting: 43.8   Smokeless tobacco: Never  Vaping Use   Vaping Use: Never used  Substance Use Topics   Alcohol use: No   Drug use: No     Family Hx: The patient's family history includes Asthma in her daughter; Cancer (age of onset: 15) in her sister; Cancer (age of onset: 53) in her father; Sudden death in her mother. There is no history of Colon cancer, Stomach cancer, Esophageal cancer, or Colon polyps.  ROS:   Please see the history of present illness.     All other systems reviewed and are negative.   Prior CV studies:   The following studies were reviewed today:  Echo 08/25/21     1. Left ventricular ejection fraction, by estimation, is 55 to 60%. The  left ventricle has normal function. The left ventricle has no regional  wall motion abnormalities. Left ventricular diastolic parameters were  normal. The average left ventricular  global longitudinal strain is -22.9 %. The global longitudinal strain is  normal.   2. Right ventricular systolic function is normal. The right ventricular  size is normal. There is normal pulmonary artery systolic pressure. The  estimated right ventricular systolic pressure is 62.7 mmHg.   3. The mitral valve is abnormal. Mild  mitral valve regurgitation.  Moderate mitral annular calcification.   4. The aortic valve is tricuspid. There is moderate calcification of the  aortic valve. There is moderate thickening of the aortic valve. Aortic  valve regurgitation is not visualized. Aortic valve  sclerosis/calcification is present, without any evidence  of aortic stenosis.   5. The inferior vena cava is normal in size with <50% respiratory  variability, suggesting right atrial pressure of 8 mmHg.   Comparison(s): Compared to prior study in 09/2019, there is no significant  change.   Labs/Other Tests and Data Reviewed:    EKG:  No ECG reviewed.  Recent Labs: 07/08/2021: Pro B Natriuretic peptide (BNP) 223.0 05/04/2022: TSH 2.65 05/17/2022: ALT 37; BUN 21; Creatinine, Ser 0.99; Hemoglobin 11.8; Platelets 201.0; Potassium 4.0; Sodium 139   Recent Lipid Panel Lab Results  Component Value Date/Time   CHOL 210 (H) 05/04/2022 09:10 AM   TRIG 55.0 05/04/2022 09:10 AM   HDL 104.30 05/04/2022 09:10 AM   CHOLHDL 2 05/04/2022 09:10 AM   LDLCALC 94 05/04/2022 09:10 AM    Wt Readings from Last 3 Encounters:  05/17/22 210 lb 2 oz (95.3 kg)  05/09/22 208 lb (94.3 kg)  03/31/22 207 lb 8 oz (94.1 kg)       Objective:    Vital Signs:  No vital signs available for review.  ASSESSMENT & PLAN:    Preoperative clearance - Able to achieve >4 METS. Per AHA/ACC guidelines, she is deemed acceptable risk for the planned procedure without additional cardiovascular testing. Will route to surgical team so they are aware.   Anticoagulant: Holding Warfarin 5 days prior to procedure and bridging with Lovenox. Instructions previously provided to patient by pharmacy team.  10/25: Take last dose of coumadin. 10/26: No coumadin, No Lovenox 10/27: No coumadin, Lovenox in AM 10/28: No coumadin, Lovenox in AM 10/29 No coumadin, Lovenox in AM before 7 AM   10/30: SURGERY; NO COUMADIN, NO LOVENOX   10/31: Take 3 tablets (15 mg)  coumadin, Lovenox once in AM 11/1: Take 1  tablets (7.5 mg) coumadin, Lovenox once in AM 11/2: Take 3  tablets (15 mg) coumadin, Lovenox once in AM 11/3: Take 3 tablets (15 mg) coumadin, Lovenox once in AM 11/4: Take 1 tablet (5 mg) coumadin, Lovenox once in AM 11/5: Take 2 tablets (10 mg) coumadin, Lovenox once in AM 11/6: Take 3 tablets (15 mg) coumadin, Lovenox once in AM   11/7: Recheck INR at [facility] at 9:00 am; no coumadin, no Lovenox until recheck.  Time:   Today, I have spent 7 minutes with the patient with telehealth technology discussing the above problems.     Medication Adjustments/Labs and Tests Ordered: Current medicines are reviewed at length with the patient today.  Concerns regarding medicines are outlined above.   Tests Ordered: No orders of the defined types were placed in this encounter.  Medication Changes: No orders of the defined types were placed in this encounter.  Follow Up:  In Person  as scheduled with Dr. Harl Bowie in November  Signed, Ligaya Cormier S Vedh Ptacek, NP  05/25/2022 1:58 PM    [place]

## 2022-05-26 ENCOUNTER — Other Ambulatory Visit: Payer: Self-pay | Admitting: Radiology

## 2022-05-26 DIAGNOSIS — R7989 Other specified abnormal findings of blood chemistry: Secondary | ICD-10-CM

## 2022-05-29 ENCOUNTER — Ambulatory Visit (HOSPITAL_COMMUNITY)
Admission: RE | Admit: 2022-05-29 | Discharge: 2022-05-29 | Disposition: A | Discharge status: Home / Self Care | Payer: Medicare Other | Source: Ambulatory Visit | Attending: Internal Medicine | Admitting: Internal Medicine

## 2022-05-29 ENCOUNTER — Encounter (HOSPITAL_COMMUNITY): Payer: Self-pay

## 2022-05-29 DIAGNOSIS — I10 Essential (primary) hypertension: Secondary | ICD-10-CM | POA: Diagnosis not present

## 2022-05-29 DIAGNOSIS — J45909 Unspecified asthma, uncomplicated: Secondary | ICD-10-CM | POA: Insufficient documentation

## 2022-05-29 DIAGNOSIS — K743 Primary biliary cirrhosis: Secondary | ICD-10-CM | POA: Diagnosis not present

## 2022-05-29 DIAGNOSIS — K7401 Hepatic fibrosis, early fibrosis: Secondary | ICD-10-CM | POA: Diagnosis not present

## 2022-05-29 DIAGNOSIS — D649 Anemia, unspecified: Secondary | ICD-10-CM | POA: Insufficient documentation

## 2022-05-29 DIAGNOSIS — E039 Hypothyroidism, unspecified: Secondary | ICD-10-CM | POA: Diagnosis not present

## 2022-05-29 DIAGNOSIS — Z853 Personal history of malignant neoplasm of breast: Secondary | ICD-10-CM | POA: Diagnosis not present

## 2022-05-29 DIAGNOSIS — E1140 Type 2 diabetes mellitus with diabetic neuropathy, unspecified: Secondary | ICD-10-CM | POA: Diagnosis not present

## 2022-05-29 DIAGNOSIS — K759 Inflammatory liver disease, unspecified: Secondary | ICD-10-CM | POA: Diagnosis not present

## 2022-05-29 DIAGNOSIS — R7989 Other specified abnormal findings of blood chemistry: Secondary | ICD-10-CM | POA: Diagnosis not present

## 2022-05-29 DIAGNOSIS — E119 Type 2 diabetes mellitus without complications: Secondary | ICD-10-CM | POA: Insufficient documentation

## 2022-05-29 LAB — COMPREHENSIVE METABOLIC PANEL
ALT: 36 U/L (ref 0–44)
AST: 30 U/L (ref 15–41)
Albumin: 3.5 g/dL (ref 3.5–5.0)
Alkaline Phosphatase: 77 U/L (ref 38–126)
Anion gap: 8 (ref 5–15)
BUN: 24 mg/dL — ABNORMAL HIGH (ref 8–23)
CO2: 29 mmol/L (ref 22–32)
Calcium: 9.4 mg/dL (ref 8.9–10.3)
Chloride: 104 mmol/L (ref 98–111)
Creatinine, Ser: 1.02 mg/dL — ABNORMAL HIGH (ref 0.44–1.00)
GFR, Estimated: 58 mL/min — ABNORMAL LOW (ref >60)
Glucose, Bld: 101 mg/dL — ABNORMAL HIGH (ref 70–99)
Potassium: 4.5 mmol/L (ref 3.5–5.1)
Sodium: 141 mmol/L (ref 135–145)
Total Bilirubin: 0.4 mg/dL (ref 0.3–1.2)
Total Protein: 7 g/dL (ref 6.5–8.1)

## 2022-05-29 LAB — CBC WITH DIFFERENTIAL/PLATELET
Abs Immature Granulocytes: 0.01 10*3/uL (ref 0.00–0.07)
Basophils Absolute: 0.1 10*3/uL (ref 0.0–0.1)
Basophils Relative: 2 %
Eosinophils Absolute: 0.1 10*3/uL (ref 0.0–0.5)
Eosinophils Relative: 3 %
HCT: 35.7 % — ABNORMAL LOW (ref 36.0–46.0)
Hemoglobin: 11.2 g/dL — ABNORMAL LOW (ref 12.0–15.0)
Immature Granulocytes: 0 %
Lymphocytes Relative: 29 %
Lymphs Abs: 1.1 10*3/uL (ref 0.7–4.0)
MCH: 28.3 pg (ref 26.0–34.0)
MCHC: 31.4 g/dL (ref 30.0–36.0)
MCV: 90.2 fL (ref 80.0–100.0)
Monocytes Absolute: 0.2 10*3/uL (ref 0.1–1.0)
Monocytes Relative: 6 %
Neutro Abs: 2.4 10*3/uL (ref 1.7–7.7)
Neutrophils Relative %: 60 %
Platelets: 208 10*3/uL (ref 150–400)
RBC: 3.96 MIL/uL (ref 3.87–5.11)
RDW: 14.2 % (ref 11.5–15.5)
WBC: 4 10*3/uL (ref 4.0–10.5)
nRBC: 0 % (ref 0.0–0.2)

## 2022-05-29 LAB — GLUCOSE, CAPILLARY: Glucose-Capillary: 96 mg/dL (ref 70–99)

## 2022-05-29 LAB — PROTIME-INR
INR: 1.1 (ref 0.8–1.2)
Prothrombin Time: 13.6 seconds (ref 11.4–15.2)

## 2022-05-29 MED ORDER — MIDAZOLAM HCL 2 MG/2ML IJ SOLN
INTRAMUSCULAR | Status: AC
  Filled 2022-05-29: qty 4

## 2022-05-29 MED ORDER — GELATIN ABSORBABLE 12-7 MM EX MISC
CUTANEOUS | Status: AC
  Administered 2022-05-29: 1
  Filled 2022-05-29: qty 1

## 2022-05-29 MED ORDER — FENTANYL CITRATE (PF) 100 MCG/2ML IJ SOLN
INTRAMUSCULAR | Status: AC
  Filled 2022-05-29: qty 4

## 2022-05-29 MED ORDER — SODIUM CHLORIDE 0.9 % IV SOLN: INTRAVENOUS | Status: DC

## 2022-05-29 MED ORDER — LIDOCAINE HCL 1 % IJ SOLN
INTRAMUSCULAR | Status: AC
  Administered 2022-05-29: 10 mL
  Filled 2022-05-29: qty 20

## 2022-05-29 MED ORDER — MIDAZOLAM HCL 2 MG/2ML IJ SOLN
INTRAMUSCULAR | Status: AC | PRN
  Administered 2022-05-29: 1 mg via INTRAVENOUS

## 2022-05-29 MED ORDER — FENTANYL CITRATE (PF) 100 MCG/2ML IJ SOLN
INTRAMUSCULAR | Status: AC | PRN
  Administered 2022-05-29: 50 ug via INTRAVENOUS

## 2022-05-29 NOTE — Discharge Instructions (Signed)
Please call Interventional Radiology clinic [phone] with any questions or concerns.  You may remove your dressing and shower tomorrow.  Liver Biopsy, Care After After a liver biopsy, it is common to have these things in the area where the biopsy was done. You may: Have pain. Feel sore. Have bruising. You may also feel tired for a few days. Follow these instructions at home: Medicines Take over-the-counter and prescription medicines only as told by your doctor. If you were prescribed an antibiotic medicine, take it as told by your doctor. Do not stop taking the antibiotic, even if you start to feel better. Do not take medicines that may thin your blood. These medicines include aspirin and ibuprofen. Take them only if your doctor tells you to. If told, take steps to prevent problems with pooping (constipation). You may need to: Drink enough fluid to keep your pee (urine) pale yellow. Take medicines. You will be told what medicines to take. Eat foods that are high in fiber. These include beans, whole grains, and fresh fruits and vegetables. Limit foods that are high in fat and sugar. These include fried or sweet foods. Ask your doctor if you should avoid driving or using machines while you are taking your medicine. Caring for your incision Follow instructions from your doctor about how to take care of your cut from surgery (incisions). Make sure you: Wash your hands with soap and water for at least 20 seconds before and after you change your bandage. If you cannot use soap and water, use hand sanitizer. Change your bandage. Leavestitches or skin glue in place for at least two weeks. Leave tape strips alone unless you are told to take them off. You may trim the edges of the tape strips if they curl up. Check your incision every day for signs of infection. Check for: Redness, swelling, or more pain. Fluid or blood. Warmth. Pus or a bad smell. Do not take baths, swim, or use a hot tub.  Ask your doctor about taking showers or sponge baths. Activity Rest at home for 1-2 days, or as told by your doctor. Get up to take short walks every 1 to 2 hours. Ask for help if you feel weak or unsteady. Do not lift anything that is heavier than 10 lb (4.5 kg), or the limit that you are told. Do not play contact sports for 2 weeks after the procedure. Return to your normal activities as told by your doctor. Ask what activities are safe for you. General instructions Do not drink alcohol in the first week after the procedure. Plan to have a responsible adult care for you for the time you are told after you leave the hospital or clinic. This is important. It is up to you to get the results of your procedure. Ask how to get your results when they are ready. Keep all follow-up visits.   Contact a doctor if: You have more bleeding in your incision. Your incision swells, or is red and more painful. You have fluid that comes from your incision. You develop a rash. You have fever or chills. Get help right away if: You have swelling, bloating, or pain in your belly (abdomen). You get dizzy or faint. You vomit or you feel like vomiting. You have trouble breathing or feel short of breath. You have chest pain. You have problems talking or seeing. You have trouble with your balance or moving your arms or legs. These symptoms may be an emergency. Get help right away. Call  your local emergency services (911 in the [place] Do not wait to see if the symptoms will go away. Do not drive yourself to the hospital. Summary After the procedure, it is common to have pain, soreness, bruising, and tiredness. Your doctor will tell you how to take care of yourself at home. Change your bandage, take your medicines, and limit your activities as told by your doctor. Call your doctor if you have symptoms of infection. Get help right away if your belly swells, your cut bleeds a lot, or you have trouble talking or  breathing. This information is not intended to replace advice given to you by your health care provider. Make sure you discuss any questions you have with your healthcare provider. Document Revised: 05/31/2020 Document Reviewed: 05/31/2020 Elsevier Patient Education  2022 [place].     Moderate Conscious Sedation, Adult, Care After This sheet gives you information about how to care for yourself after your procedure. Your health care provider may also give you more specific instructions. If you have problems or questions, contact your health careprovider. What can I expect after the procedure? After the procedure, it is common to have: Sleepiness for several hours. Impaired judgment for several hours. Difficulty with balance. Vomiting if you eat too soon. Follow these instructions at home: For the time period you were told by your health care provider: Rest. Do not participate in activities where you could fall or become injured. Do not drive or use machinery. Do not drink alcohol. Do not take sleeping pills or medicines that cause drowsiness. Do not make important decisions or sign legal documents. Do not take care of children on your own. Eating and drinking  Follow the diet recommended by your health care provider. Drink enough fluid to keep your urine pale yellow. If you vomit: Drink water, juice, or soup when you can drink without vomiting. Make sure you have little or no nausea before eating solid foods.  General instructions Take over-the-counter and prescription medicines only as told by your health care provider. Have a responsible adult stay with you for the time you are told. It is important to have someone help care for you until you are awake and alert. Do not smoke. Keep all follow-up visits as told by your health care provider. This is important. Contact a health care provider if: You are still sleepy or having trouble with balance after 24 hours. You feel  light-headed. You keep feeling nauseous or you keep vomiting. You develop a rash. You have a fever. You have redness or swelling around the IV site. Get help right away if: You have trouble breathing. You have new-onset confusion at home. Summary After the procedure, it is common to feel sleepy, have impaired judgment, or feel nauseous if you eat too soon. Rest after you get home. Know the things you should not do after the procedure. Follow the diet recommended by your health care provider and drink enough fluid to keep your urine pale yellow. Get help right away if you have trouble breathing or new-onset confusion at home. This information is not intended to replace advice given to you by your health care provider. Make sure you discuss any questions you have with your healthcare provider. Document Revised: 11/14/2019 Document Reviewed: 06/12/2019 Elsevier Patient Education  2022 [employer].

## 2022-05-29 NOTE — Procedures (Addendum)
Interventional Radiology Procedure Note  Procedure: US guided biopsy of liver. Medical liver Complications: None EBL: None Recommendations: - Bedrest 2 hours.   - Routine wound care - Follow up pathology - Advance diet  - ok to restart anticoagulation on schedule today  Signed,  Corrie Mckusick, DO

## 2022-05-29 NOTE — H&P (Signed)
Chief Complaint: Patient was seen in consultation today for elevated liver enzymes  Referring Physician(s): Grier Mitts R  Supervising Physician: Corrie Mckusick  Patient Status: [facility] - Out-pt  History of Present Illness: Kelli Martinez is a 76 y.o. female with PMH of asthma, anemia, breast cancer in 1994, hypertension, clotting disorder, diabetes mellitus, hypothyroidism, and post-operative nausea and vomiting being seen today for elevated liver enzymes. The patient has been followed by GI, who have noted mildly elevated LFTs. GI has requested a liver biopsy to further evaluate the elevated LFTs.  Past Medical History:  Diagnosis Date   Allergy    Anemia    Arthritis    Asthma    Back pain, chronic    GETS INJECTIONS IN BACK   Blind right eye    hemorrhage   Blood transfusion    Cancer ([place])    breast 1994   Clotting disorder ([place])    Diabetes mellitus without complication ([place])    takes precose   Elevated liver enzymes    Gallstones    GERD (gastroesophageal reflux disease)    HX: breast cancer    Hyperlipidemia    Hypertension    Hypothyroid    Lymphedema of arm    RT   Morbid obesity ([facility])    OSA (obstructive sleep apnea)    has not used in 2 years-lost weight   Osteoporosis    Peripheral neuropathy    on gabapentin   PONV (postoperative nausea and vomiting)    Psoriasis    Pulmonary embolism ([place]) 1998 / 1994 /1968   Syncope and collapse 2011   due to anemia    Past Surgical History:  Procedure Laterality Date   ABDOMINAL HYSTERECTOMY     BONE MARROW TRANSPLANT  1994   CARDIOVASCULAR STRESS TEST  05/23/2005   EF 53%   carpal tunnell     bil   cataracts     CHOLECYSTECTOMY  05/10/2012   Procedure: LAPAROSCOPIC CHOLECYSTECTOMY WITH INTRAOPERATIVE CHOLANGIOGRAM;  Surgeon: Pedro Earls, MD;  Location: [facility];  Service: General;  Laterality: N/A;  Laparoscopic Cholecystectomy with Intraoperative Cholangiogram   GASTRIC BYPASS  2011    bariatric surgery   HIATAL HERNIA REPAIR     IVC filter     recurrent DVT   KNEE ARTHROTOMY  1998   MASS EXCISION Right 11/24/2016   Procedure: EXCISION RIGHT AXILLARY LESION AND CHEST WALL LESION;  Surgeon: Johnathan Hausen, MD;  Location: [facility];  Service: General;  Laterality: Right;   MASTECTOMY     Right   US ECHOCARDIOGRAPHY  10/27/08   EF 55-60%    Allergies: Adhesive [tape]  Medications: Prior to Admission medications   Medication Sig Start Date End Date Taking? Authorizing Provider  acarbose (PRECOSE) 50 MG tablet 1 tablet at start of breakfast and lunch and at dinnertime 05/09/22   Elayne Snare, MD  albuterol [facility] HFA) 108 (90 Base) MCG/ACT inhaler Inhale 2 puffs into the lungs every 6 (six) hours as needed for wheezing or shortness of breath. 09/13/21   Deneise Lever, MD  baclofen (LIORESAL) 20 MG tablet TAKE 1 TABLET THREE TIMES A DAY (UPDATED PRESCRIPTION) 05/03/22   Billie Ruddy, MD  Blood Glucose Monitoring Suppl (FREESTYLE FREEDOM LITE) w/Device KIT Use as directed to check blood sugar once daily. 02/20/19   Elayne Snare, MD  Calcium Carbonate-Vitamin D 600-400 MG-UNIT tablet Take 1 tablet by mouth daily.    [provider]  clobetasol cream (TEMOVATE)  2.87 % APPLY 1 APPLICATION TOPICALLY TWICE A DAY 12/01/21   Billie Ruddy, MD  enoxaparin (LOVENOX) 150 MG/ML injection Inject 1 mL (150 mg total) into the skin daily for 10 doses. 05/26/22 06/05/22  Billie Ruddy, MD  fluticasone (FLONASE) 50 MCG/ACT nasal spray Place 2 sprays into both nostrils daily. 09/13/21   Baird Lyons D, MD  fluticasone-salmeterol (ADVAIR DISKUS) 100-50 MCG/ACT AEPB USE 1 INHALATION EVERY 12 HOURS, RINSE MOUTH AFTER USING 09/13/21   Baird Lyons D, MD  FREESTYLE LITE test strip USE AS INSTRUCTED TO CHECK BLOOD SUGAR ONCE DAILY 05/05/21   Elayne Snare, MD  furosemide (LASIX) 80 MG tablet Take 1 tablet (80 mg total) by mouth 2 (two) times daily. 09/16/21 05/29/22   Janina Mayo, MD  gabapentin (NEURONTIN) 600 MG tablet TAKE 1 TABLET THREE TIMES A DAY (DOSE CHANGE) 05/03/22   Billie Ruddy, MD  HYDROcodone-acetaminophen (NORCO) 5-325 MG tablet Take 1 tablet by mouth 2 (two) times daily as needed for moderate pain. Patient taking differently: Take 0.5-1 tablets by mouth 3 (three) times daily as needed for moderate pain or severe pain. 04/12/18   Elayne Snare, MD  Lancets (FREESTYLE) lancets USE AS INSTRUCTED TO CHECK BLOOD SUGAR ONCE DAILY 09/23/21   Elayne Snare, MD  levothyroxine (SYNTHROID) 100 MCG tablet TAKE 1 TABLET DAILY BEFORE BREAKFAST (NEED WHEN 88 MCG OUT) 11/07/21   Elayne Snare, MD  Magnesium Cl-Calcium Carbonate (SLOW-MAG PO) Take 500 mg by mouth at bedtime.     [provider]  methylcellulose (ARTIFICIAL TEARS) 1 % ophthalmic solution Place 1 drop into both eyes as needed. Dry eyes    [provider]  omeprazole (PRILOSEC) 40 MG capsule Take 1 capsule (40 mg total) by mouth in the morning and at bedtime. 05/17/22   Sharyn Creamer, MD  potassium chloride SA (KLOR-CON M) 20 MEQ tablet Take 2 tablets (40 mEq total) by mouth 2 (two) times daily. 09/16/21   Janina Mayo, MD  pravastatin (PRAVACHOL) 10 MG tablet TAKE 1 TABLET DAILY 01/02/22   Billie Ruddy, MD  spironolactone (ALDACTONE) 100 MG tablet TAKE 1 TABLET DAILY 05/02/22   Janina Mayo, MD  temazepam (RESTORIL) 15 MG capsule 1-2 for sleep as needed 08/31/17   Deneise Lever, MD  vitamin B-12 (CYANOCOBALAMIN) 500 MCG tablet Take 500 mcg by mouth every other day.    [provider]  Vitamin D, Ergocalciferol, 50000 units CAPS Take 1 capsule by mouth once a week. 05/09/22   Elayne Snare, MD  warfarin (COUMADIN) 5 MG tablet TAKE 2 TABLETS DAILY EXCEPT TAKE 1 TABLET ON WEDNESDAYS AND SATURDAYS OR TAKE AS DIRECTED BY ANTICOAGULATION CLINIC 04/18/22   Billie Ruddy, MD     Family History  Problem Relation Age of Onset   Sudden death Mother        car accident    Cancer Father 49       lung   Cancer Sister 42       lung cancer   Asthma Daughter    Colon cancer Neg Hx    Stomach cancer Neg Hx    Esophageal cancer Neg Hx    Colon polyps Neg Hx     Social History   Socioeconomic History   Marital status: Widowed    Spouse name: Not on file   Number of children: 2   Years of education: Not on file   Highest education level: Not on file  Occupational History  Occupation: retired  Tobacco Use   Smoking status: Former    Packs/day: 0.50    Years: 10.00    Total pack years: 5.00    Types: Cigarettes    Quit date: 07/31/1978    Years since quitting: 43.8   Smokeless tobacco: Never  Vaping Use   Vaping Use: Never used  Substance and Sexual Activity   Alcohol use: No   Drug use: No   Sexual activity: Not Currently  Other Topics Concern   Not on file  Social History Narrative   Widowed with 2 children,  Lives in a one story home and her son and his wife recently moved in with her.  Retired Freight forwarder for [employer].  Education: some college.    Social Determinants of Health   Financial Resource Strain: Low Risk  (10/11/2021)   Overall Financial Resource Strain (CARDIA)    Difficulty of Paying Living Expenses: Not hard at all  Food Insecurity: No Food Insecurity (10/11/2021)   Hunger Vital Sign    Worried About Running Out of Food in the Last Year: Never true    Ran Out of Food in the Last Year: Never true  Transportation Needs: No Transportation Needs (10/11/2021)   PRAPARE - Hydrologist (Medical): No    Lack of Transportation (Non-Medical): No  Physical Activity: Inactive (10/11/2021)   Exercise Vital Sign    Days of Exercise per Week: 0 days    Minutes of Exercise per Session: 0 min  Stress: No Stress Concern Present (10/11/2021)   [place]    Feeling of Stress : Not at all  Social Connections: Moderately Isolated (10/05/2020)   Social Connection  and Isolation Panel [NHANES]    Frequency of Communication with Friends and Family: More than three times a week    Frequency of Social Gatherings with Friends and Family: Twice a week    Attends Religious Services: More than 4 times per year    Active Member of [employer] or Organizations: No    Attends Archivist Meetings: Never    Marital Status: Widowed    Review of Systems: A 12 point ROS discussed and pertinent positives are indicated in the HPI above.  All other systems are negative.  Review of Systems  Constitutional:  Negative for chills and fever.  Respiratory:  Positive for shortness of breath.   Cardiovascular:  Positive for leg swelling. Negative for chest pain.  Gastrointestinal:  Negative for abdominal pain, diarrhea, nausea and vomiting.  Neurological:  Negative for dizziness and headaches.  Psychiatric/Behavioral:  Negative for confusion.     Vital Signs: There were no vitals taken for this visit.  Physical Exam Vitals reviewed.  Constitutional:      General: She is not in acute distress.    Appearance: She is not ill-appearing.  HENT:     Head: Normocephalic.     Mouth/Throat:     Mouth: Mucous membranes are moist.  Cardiovascular:     Rate and Rhythm: Normal rate and regular rhythm.     Pulses: Normal pulses.     Heart sounds: Normal heart sounds.  Pulmonary:     Effort: Pulmonary effort is normal. No respiratory distress.     Breath sounds: Normal breath sounds.  Abdominal:     General: Bowel sounds are normal.     Palpations: Abdomen is soft.     Tenderness: There is no abdominal tenderness.  Musculoskeletal:  Right lower leg: Edema present.     Left lower leg: Edema present.  Skin:    General: Skin is warm and dry.  Neurological:     Mental Status: She is alert and oriented to person, place, and time.  Psychiatric:        Mood and Affect: Mood normal.        Behavior: Behavior normal.     Imaging: No results  found.  Labs:  CBC: Recent Labs    07/08/21 1219 02/14/22 0932 05/17/22 1138 05/29/22 1130  WBC 4.2 4.2 5.3 4.0  HGB 12.1 11.7* 11.8* 11.2*  HCT 37.4 35.9* 37.1 35.7*  PLT 170.0 195.0 201.0 208    COAGS: Recent Labs    03/10/22 0000 04/18/22 0000 05/09/22 0000 05/29/22 1130  INR 2.0 1.9* 2.9 1.1    BMP: Recent Labs    12/14/21 1045 01/03/22 0825 05/04/22 0910 05/17/22 1138  NA 135 135 139 139  K 5.0 4.3 4.1 4.0  CL 93* 94* 100 101  CO2 28 33* 34* 30  GLUCOSE 102* 113* 119* 102*  BUN 31* 36* 25* 21  CALCIUM 10.0 10.2 9.9 9.9  CREATININE 1.16* 1.26* 1.19 0.99    LIVER FUNCTION TESTS: Recent Labs    08/29/21 1017 12/14/21 1045 01/03/22 0825 05/17/22 1138  BILITOT 0.4 0.3 0.5 0.3  AST 33 38 69* 30  ALT 33 48* 52* 37*  ALKPHOS 89 103 89 91  PROT 7.0 7.4 7.5 7.5  ALBUMIN 4.2 4.5 4.1 4.2    TUMOR MARKERS: No results for input(s): "AFPTM", "CEA", "CA199", "CHROMGRNA" in the last 8760 hours.  Assessment and Plan:  Akiva Josey is a 76 yo female being seen today for image-guided liver biopsy due to elevated liver enzymes. Patient was referred to IR from GI. The case has been reviewed and is approved to proceed on 05/29/22.   Risks and benefits of image-guided liver biopsy was discussed with the patient and/or patient's family including, but not limited to bleeding, infection, damage to adjacent structures or low yield requiring additional tests.  All of the questions were answered and there is agreement to proceed.  Consent signed and in chart.   Thank you for this interesting consult.  I greatly enjoyed meeting Maclaine Ahola Nikolski and look forward to participating in their care.  A copy of this report was sent to the requesting provider on this date.  Electronically Signed: Lura Em, PA-C 05/29/2022, 12:32 PM   I spent a total of  30 Minutes   in face to face in clinical consultation, greater than 50% of which was  counseling/coordinating care for elevated liver enzymes.

## 2022-06-02 LAB — SURGICAL PATHOLOGY

## 2022-06-02 NOTE — Progress Notes (Signed)
Hi Beth, please let the patient know that her liver biopsy shows that she has a condition called primary biliary cholangitis. This is an autoimmune disease that is likely leading to her elevated liver tests and over time can cause damage to the liver. She will need to be started on treatment to prevent worsening of her liver disease over time. Please send her PO ursodiol 600 mg BID.

## 2022-06-05 ENCOUNTER — Other Ambulatory Visit: Payer: Self-pay

## 2022-06-05 MED ORDER — URSODIOL 300 MG PO CAPS: 600.0000 mg | ORAL_CAPSULE | Freq: Two times a day (BID) | ORAL | 6 refills | Status: DC

## 2022-06-06 ENCOUNTER — Ambulatory Visit (INDEPENDENT_AMBULATORY_CARE_PROVIDER_SITE_OTHER): Payer: Medicare Other

## 2022-06-06 DIAGNOSIS — Z7901 Long term (current) use of anticoagulants: Secondary | ICD-10-CM | POA: Diagnosis not present

## 2022-06-06 LAB — POCT INR: INR: 1.9 — AB (ref 2.0–3.0)

## 2022-06-06 NOTE — Patient Instructions (Signed)
Increase dose today to 3 tablets.   Then continue to take 2 tablets daily except 1 tablet on Wedesdays and Saturdays. Recheck in 4 weeks.

## 2022-06-06 NOTE — Progress Notes (Signed)
Increase dose today to 3 tablets.   Then continue to take 2 tablets daily except 1 tablet on Wedesdays and Saturdays. Recheck in 4 weeks.

## 2022-06-12 ENCOUNTER — Ambulatory Visit: Payer: Medicare Other | Attending: Internal Medicine | Admitting: Internal Medicine

## 2022-06-12 ENCOUNTER — Encounter: Payer: Self-pay | Admitting: Internal Medicine

## 2022-06-12 VITALS — BP 114/64 | HR 61 | Ht 67.5 in | Wt 222.6 lb

## 2022-06-12 DIAGNOSIS — I5030 Unspecified diastolic (congestive) heart failure: Secondary | ICD-10-CM | POA: Diagnosis not present

## 2022-06-12 DIAGNOSIS — I872 Venous insufficiency (chronic) (peripheral): Secondary | ICD-10-CM | POA: Diagnosis not present

## 2022-06-12 NOTE — Patient Instructions (Signed)
Medication Instructions:  No Changes In Medications at this time.  *If you need a refill on your cardiac medications before your next appointment, please call your pharmacy*  Lab Work: None Ordered At This Time.  If you have labs (blood work) drawn today and your tests are completely normal, you will receive your results only by: [place] (if you have MyChart) OR A paper copy in the mail If you have any lab test that is abnormal or we need to change your treatment, we will call you to review the results.  Testing/Procedures: None Ordered At This Time.   Follow-Up: At [facility], you and your health needs are our priority.  As part of our continuing mission to provide you with exceptional heart care, we have created designated Provider Care Teams.  These Care Teams include your primary Cardiologist (physician) and Advanced Practice Providers (APPs -  Physician Assistants and Nurse Practitioners) who all work together to provide you with the care you need, when you need it.  Your next appointment:   AS NEEDED   The format for your next appointment:   In Person  Provider:   Janina Mayo, MD

## 2022-06-12 NOTE — Progress Notes (Signed)
Cardiology Office Note:    Date:  06/12/2022   ID:  Kelli Martinez, DOB September 07, 1945, MRN [id number]  PCP:  Kelli Ruddy, MD   [facility] Providers Cardiologist:  Kelli Mayo, MD     Referring MD: Kelli Ruddy, MD   No chief complaint on file. LE edema  History of Present Illness:    Kelli Martinez is a 76 y.o. female with a hx of DM2, GERD, HTN, OSA prescribed cpap, DVT/ recurrent PE s.p IVC filter (per patient since 2011) continued on coumadin, gastric bypass surgery, breast cancer ~ 30 years ago  s/p chemo (unknown) bone marrow transplant in remission referral from Dr. Volanda Martinez regarding palpitations, LE edema and weight gain  [name] reports that her legs have had increased swelling. She keeps her legs elevated at night. Its been about a year in a half that it has progressed. She was being seen by [facility]. She can get easily fatigued with daily activities. She sleeps with 2 pillows. She says lying more flat makes her feel bad/head hurts.  She says that walking to the kitchen and back she gets SOB.  She denies PND.  Her weight is up 3 pounds today from yesterday. No prior hx of CHF.   She saw Dr. Volanda Martinez 07/08/2021 with concern for CHF. She was taking lasix 20 mg at night.  BNP was mildly elevated 223. Her echo is pending. Her chest xray showed no edema. Her lasix was increased from 20 mg to 40 mg daily started yesterday. She has normal renal function. She's on spironolactone takes 50 mg some days and 100 mg some days. Blood pressure is in good control.  She's had report of LE edema over the summer. She was taking lasix 20 mg with this. She's had right UE edema 2/2 [facility] with hx of breast cancer.  Otherwise she's had significant leg cramping, statin was reduced previously.  She saw Dr. Martinique in 2012. He notes: She has remote history of syncope with normal cardiac evaluation in the past including echocardiogram, nuclear stress testing,  and tilt table testing. She has a history of recurrent pulmonary emboli, diabetes, and hypertension.  Interim Hx She still has significant LE edema. She sleeps on 2 pillows 2/2 being dizzy. No orthopnea. She is getting cortisone shots in her hip. She notes increased leg swelling with this. She is tracking her weights. Weights 202 to 208. She is peeing. She notes constipation. She has not been to the [facility] clinic yet but is planning to. For lasix, she just increased to 80 mg daily.  Her renal function from 08/29/2021 was normal. K was normal.  Interim hx 12/14/2021 She was increased to lasix 80 mg BID when I saw her in Feb. No hospitalizations. Her weight at home is 197. She still sleeps on pillows. Her legs are better with elevation. She does wear compression stockings. Her blood pressure is well controlled. She drinks boost in the AM  Wt Readings from Last 3 Encounters:  06/12/22 222 lb 9.6 oz (101 kg)  05/29/22 209 lb 7 oz (95 kg)  05/17/22 210 lb 2 oz (95.3 kg)   Interim Hx 06/12/2022 Crt 1.0 05/29/2022.  BP well controlled today. She has primary biliary cholangitis. She started urosdiol. Albumin 3.5.  LE edema improves with raising her legs. No PND/orthopnea  Cardiology Studies  TTE 10/09/2019  1. Left ventricular ejection fraction by 3D volume is 57 %. The left  ventricle has normal function. The left  ventricle has no regional wall motion abnormalities. Left ventricular diastolic parameters were normal. The average left ventricular global  longitudinal strain is -23.6 % (normal).   2. Right ventricular systolic function is normal. The right ventricular size is normal. There is normal pulmonary artery systolic pressure. The estimated right ventricular systolic pressure is 44.9 mmHg.   3. The mitral valve is abnormal. Mild mitral annular calcification.  Mitral valve leaflets demonstrate mild bowing in systole, without definite prolapse. Mild mitral valve regurgitation. No evidence of  mitral stenosis.   4. The aortic valve is tricuspid. Aortic valve regurgitation is trivial.  Mild aortic valve sclerosis is present, with no evidence of aortic valve stenosis.   5. The inferior vena cava is normal in size with greater than 50%  respiratory variability, suggesting right atrial pressure of 3 mmHg.  TTE 08/25/2021 1. Left ventricular ejection fraction, by estimation, is 55 to 60%. The left ventricle has normal function. The left ventricle has no regional wall motion abnormalities. Left ventricular diastolic parameters were normal. The average left ventricular  global longitudinal strain is -22.9 %. The global longitudinal strain is normal.   2. Right ventricular systolic function is normal. The right ventricular  size is normal. There is normal pulmonary artery systolic pressure. The estimated right ventricular systolic pressure is 75.3 mmHg.   3. The mitral valve is abnormal. Mild mitral valve regurgitation.  Moderate mitral annular calcification.   4. The aortic valve is tricuspid. There is moderate calcification of the aortic valve. There is moderate thickening of the aortic valve. Aortic valve regurgitation is not visualized. Aortic valve  sclerosis/calcification is present, without any evidence  of aortic stenosis.   5. The inferior vena cava is normal in size with <50% respiratory  variability, suggesting right atrial pressure of 8 mmHg.   Comparison(s): Compared to prior study in 09/2019, there is no significant change.        Past Medical History:  Diagnosis Date   Allergy    Anemia    Arthritis    Asthma    Back pain, chronic    GETS INJECTIONS IN BACK   Blind right eye    hemorrhage   Blood transfusion    Cancer ([place])    breast 1994   Clotting disorder ([place])    Diabetes mellitus without complication ([place])    takes precose   Elevated liver enzymes    Gallstones    GERD (gastroesophageal reflux disease)    HX: breast cancer    Hyperlipidemia     Hypertension    Hypothyroid    Lymphedema of arm    RT   Morbid obesity ([facility])    OSA (obstructive sleep apnea)    has not used in 2 years-lost weight   Osteoporosis    Peripheral neuropathy    on gabapentin   PONV (postoperative nausea and vomiting)    Psoriasis    Pulmonary embolism ([place]) 1998 / 1994 /1968   Syncope and collapse 2011   due to anemia    Past Surgical History:  Procedure Laterality Date   ABDOMINAL HYSTERECTOMY     BONE MARROW TRANSPLANT  1994   CARDIOVASCULAR STRESS TEST  05/23/2005   EF 53%   carpal tunnell     bil   cataracts     CHOLECYSTECTOMY  05/10/2012   Procedure: LAPAROSCOPIC CHOLECYSTECTOMY WITH INTRAOPERATIVE CHOLANGIOGRAM;  Surgeon: Pedro Earls, MD;  Location: [facility];  Service: General;  Laterality: N/A;  Laparoscopic Cholecystectomy with Intraoperative Cholangiogram  GASTRIC BYPASS  2011   bariatric surgery   HIATAL HERNIA REPAIR     IVC filter     recurrent DVT   KNEE ARTHROTOMY  1998   MASS EXCISION Right 11/24/2016   Procedure: EXCISION RIGHT AXILLARY LESION AND CHEST WALL LESION;  Surgeon: Johnathan Hausen, MD;  Location: [place];  Service: General;  Laterality: Right;   MASTECTOMY     Right   US ECHOCARDIOGRAPHY  10/27/08   EF 55-60%    Current Medications: Current Meds  Medication Sig   acarbose (PRECOSE) 50 MG tablet 1 tablet at start of breakfast and lunch and at dinnertime   albuterol (PROAIR HFA) 108 (90 Base) MCG/ACT inhaler Inhale 2 puffs into the lungs every 6 (six) hours as needed for wheezing or shortness of breath.   baclofen (LIORESAL) 20 MG tablet TAKE 1 TABLET THREE TIMES A DAY (UPDATED PRESCRIPTION)   Blood Glucose Monitoring Suppl (FREESTYLE FREEDOM LITE) w/Device KIT Use as directed to check blood sugar once daily.   Calcium Carbonate-Vitamin D 600-400 MG-UNIT tablet Take 1 tablet by mouth daily.   clobetasol cream (TEMOVATE) 2.77 % APPLY 1 APPLICATION TOPICALLY TWICE A DAY   enoxaparin  (LOVENOX) 150 MG/ML injection Inject 1 mL (150 mg total) into the skin daily for 10 doses.   fluticasone (FLONASE) 50 MCG/ACT nasal spray Place 2 sprays into both nostrils daily.   fluticasone-salmeterol (ADVAIR DISKUS) 100-50 MCG/ACT AEPB USE 1 INHALATION EVERY 12 HOURS, RINSE MOUTH AFTER USING   FREESTYLE LITE test strip USE AS INSTRUCTED TO CHECK BLOOD SUGAR ONCE DAILY   furosemide (LASIX) 80 MG tablet Take 1 tablet (80 mg total) by mouth 2 (two) times daily.   gabapentin (NEURONTIN) 600 MG tablet TAKE 1 TABLET THREE TIMES A DAY (DOSE CHANGE)   HYDROcodone-acetaminophen (NORCO) 5-325 MG tablet Take 1 tablet by mouth 2 (two) times daily as needed for moderate pain. (Patient taking differently: Take 0.5-1 tablets by mouth 3 (three) times daily as needed for moderate pain or severe pain.)   Lancets (FREESTYLE) lancets USE AS INSTRUCTED TO CHECK BLOOD SUGAR ONCE DAILY   levothyroxine (SYNTHROID) 100 MCG tablet TAKE 1 TABLET DAILY BEFORE BREAKFAST (NEED WHEN 88 MCG OUT)   Magnesium Cl-Calcium Carbonate (SLOW-MAG PO) Take 500 mg by mouth at bedtime.    methylcellulose (ARTIFICIAL TEARS) 1 % ophthalmic solution Place 1 drop into both eyes as needed. Dry eyes   omeprazole (PRILOSEC) 40 MG capsule Take 1 capsule (40 mg total) by mouth in the morning and at bedtime.   potassium chloride SA (KLOR-CON M) 20 MEQ tablet Take 2 tablets (40 mEq total) by mouth 2 (two) times daily.   pravastatin (PRAVACHOL) 10 MG tablet TAKE 1 TABLET DAILY   spironolactone (ALDACTONE) 100 MG tablet TAKE 1 TABLET DAILY   temazepam (RESTORIL) 15 MG capsule 1-2 for sleep as needed   ursodiol (ACTIGALL) 300 MG capsule Take 2 capsules (600 mg total) by mouth 2 (two) times daily.   vitamin B-12 (CYANOCOBALAMIN) 500 MCG tablet Take 500 mcg by mouth every other day.   Vitamin D, Ergocalciferol, 50000 units CAPS Take 1 capsule by mouth once a week.   warfarin (COUMADIN) 5 MG tablet TAKE 2 TABLETS DAILY EXCEPT TAKE 1 TABLET ON WEDNESDAYS  AND SATURDAYS OR TAKE AS DIRECTED BY ANTICOAGULATION CLINIC     Allergies:   Adhesive [tape]   Social History   Socioeconomic History   Marital status: Widowed    Spouse name: Not on file  Number of children: 2   Years of education: Not on file   Highest education level: Not on file  Occupational History   Occupation: retired  Tobacco Use   Smoking status: Former    Packs/day: 0.50    Years: 10.00    Total pack years: 5.00    Types: Cigarettes    Quit date: 07/31/1978    Years since quitting: 43.8   Smokeless tobacco: Never  Vaping Use   Vaping Use: Never used  Substance and Sexual Activity   Alcohol use: No   Drug use: No   Sexual activity: Not Currently  Other Topics Concern   Not on file  Social History Narrative   Widowed with 2 children,  Lives in a one story home and her son and his wife recently moved in with her.  Retired Freight forwarder for [employer].  Education: some college.    Social Determinants of Health   Financial Resource Strain: Low Risk  (10/11/2021)   Overall Financial Resource Strain (CARDIA)    Difficulty of Paying Living Expenses: Not hard at all  Food Insecurity: No Food Insecurity (10/11/2021)   Hunger Vital Sign    Worried About Running Out of Food in the Last Year: Never true    Ran Out of Food in the Last Year: Never true  Transportation Needs: No Transportation Needs (10/11/2021)   PRAPARE - Hydrologist (Medical): No    Lack of Transportation (Non-Medical): No  Physical Activity: Inactive (10/11/2021)   Exercise Vital Sign    Days of Exercise per Week: 0 days    Minutes of Exercise per Session: 0 min  Stress: No Stress Concern Present (10/11/2021)   [place]    Feeling of Stress : Not at all  Social Connections: Moderately Isolated (10/05/2020)   Social Connection and Isolation Panel [NHANES]    Frequency of Communication with Friends and Family: More than  three times a week    Frequency of Social Gatherings with Friends and Family: Twice a week    Attends Religious Services: More than 4 times per year    Active Member of [employer] or Organizations: No    Attends Archivist Meetings: Never    Marital Status: Widowed     Family History: The patient's family history includes Asthma in her daughter; Cancer (age of onset: 59) in her sister; Cancer (age of onset: 53) in her father; Sudden death in her mother. There is no history of Colon cancer, Stomach cancer, Esophageal cancer, or Colon polyps.  ROS:   Please see the history of present illness.     All other systems reviewed and are negative.  EKGs/Labs/Other Studies Reviewed:    The following studies were reviewed today:   EKG:  EKG is  ordered today.  The ekg ordered today demonstrates   Sinus rhythm with 1st degree AV block PR 222m  06/12/2022- NSR, PAC 1st degree AV block PR 256 ms  Recent Labs: 07/08/2021: Pro B Natriuretic peptide (BNP) 223.0 05/04/2022: TSH 2.65 05/29/2022: ALT 36; BUN 24; Creatinine, Ser 1.02; Hemoglobin 11.2; Platelets 208; Potassium 4.5; Sodium 141  Recent Lipid Panel    Component Value Date/Time   CHOL 210 (H) 05/04/2022 0910   TRIG 55.0 05/04/2022 0910   HDL 104.30 05/04/2022 0910   CHOLHDL 2 05/04/2022 0910   VLDL 11.0 05/04/2022 0910   LDLCALC 94 05/04/2022 0910     Risk Assessment/Calculations:  Physical Exam:    VS:  Vitals:   06/12/22 0909  BP: 114/64  Pulse: 61  SpO2: 97%     Wt Readings from Last 3 Encounters:  06/12/22 222 lb 9.6 oz (101 kg)  05/29/22 209 lb 7 oz (95 kg)  05/17/22 210 lb 2 oz (95.3 kg)     GEN:  Well nourished, well developed in no acute distress HEENT: Normal NECK: No significant JVD; No carotid bruits LYMPHATICS: No lymphadenopathy CARDIAC: RRR, no murmurs, rubs, gallops RESPIRATORY:  Clear to auscultation without rales, wheezing or rhonchi  ABDOMEN: Soft, non-tender,  non-distended MUSCULOSKELETAL: RUE edema, BL LE edema thigh to the feet SKIN: Warm and dry NEUROLOGIC:  Alert and oriented x 3 PSYCHIATRIC:  Normal affect   ASSESSMENT:    #Lymph[facility]a/Primary Biliary Cholangitis : patient initially had some signs c/f Hfpef. Her BNP was mildly elevated but could be underestimated with weight. She's had no admissions for CHF. Her echo showed, normal EF,  normal LA size. normal strain, no pulmonary htn, E/e' < 14. I have low suspicion VO is cardiac related. We discussed continuing with compression stockings, trialing more protein boost. Albumin not too low. Don't want to over diurese. - continue lasix 80 mg BID will get FU labs - continue with lymphedema clinic - Continue compression stockings  #HTN: good control   - Continue spironolactone 100 mg daily  #HLD: LDL 88 mg/dL at goal  PLAN:    In order of problems listed above:   Considering her fluid retention is non-cardiac related, will recommend follow up PRN. If she has any cardiac needs in the future, will be a pleasure to see her again        Medication Adjustments/Labs and Tests Ordered: Current medicines are reviewed at length with the patient today.  Concerns regarding medicines are outlined above.  Orders Placed This Encounter  Procedures   EKG 12-Lead   No orders of the defined types were placed in this encounter.   Patient Instructions  Medication Instructions:  No Changes In Medications at this time.  *If you need a refill on your cardiac medications before your next appointment, please call your pharmacy*  Lab Work: None Ordered At This Time.  If you have labs (blood work) drawn today and your tests are completely normal, you will receive your results only by: [place] (if you have MyChart) OR A paper copy in the mail If you have any lab test that is abnormal or we need to change your treatment, we will call you to review the results.  Testing/Procedures: None Ordered  At This Time.   Follow-Up: At [facility], you and your health needs are our priority.  As part of our continuing mission to provide you with exceptional heart care, we have created designated Provider Care Teams.  These Care Teams include your primary Cardiologist (physician) and Advanced Practice Providers (APPs -  Physician Assistants and Nurse Practitioners) who all work together to provide you with the care you need, when you need it.  Your next appointment:   AS NEEDED   The format for your next appointment:   In Person  Provider:   Janina Mayo, MD             Signed, Kelli Mayo, MD  06/12/2022 9:40 AM    [place]

## 2022-06-13 DIAGNOSIS — M4310 Spondylolisthesis, site unspecified: Secondary | ICD-10-CM | POA: Diagnosis not present

## 2022-06-13 DIAGNOSIS — M5416 Radiculopathy, lumbar region: Secondary | ICD-10-CM | POA: Diagnosis not present

## 2022-06-13 DIAGNOSIS — M5030 Other cervical disc degeneration, unspecified cervical region: Secondary | ICD-10-CM | POA: Diagnosis not present

## 2022-06-13 DIAGNOSIS — M5136 Other intervertebral disc degeneration, lumbar region: Secondary | ICD-10-CM | POA: Diagnosis not present

## 2022-06-13 DIAGNOSIS — Z79891 Long term (current) use of opiate analgesic: Secondary | ICD-10-CM | POA: Diagnosis not present

## 2022-06-20 ENCOUNTER — Ambulatory Visit: Payer: Medicare Other

## 2022-06-21 DIAGNOSIS — L603 Nail dystrophy: Secondary | ICD-10-CM | POA: Diagnosis not present

## 2022-06-21 DIAGNOSIS — E1151 Type 2 diabetes mellitus with diabetic peripheral angiopathy without gangrene: Secondary | ICD-10-CM | POA: Diagnosis not present

## 2022-06-21 DIAGNOSIS — I739 Peripheral vascular disease, unspecified: Secondary | ICD-10-CM | POA: Diagnosis not present

## 2022-06-21 DIAGNOSIS — L84 Corns and callosities: Secondary | ICD-10-CM | POA: Diagnosis not present

## 2022-07-03 ENCOUNTER — Telehealth: Payer: Self-pay | Admitting: Internal Medicine

## 2022-07-03 NOTE — Telephone Encounter (Signed)
Inbound call from patient requesting a call to discuss medications and diet. Please advise.

## 2022-07-04 ENCOUNTER — Ambulatory Visit (INDEPENDENT_AMBULATORY_CARE_PROVIDER_SITE_OTHER): Payer: Medicare Other

## 2022-07-04 DIAGNOSIS — Z7901 Long term (current) use of anticoagulants: Secondary | ICD-10-CM

## 2022-07-04 LAB — POCT INR: INR: 1.9 — AB (ref 2.0–3.0)

## 2022-07-04 NOTE — Progress Notes (Signed)
Increase dose today to 3 tablets and then change weekly dose to take 2 tablets daily except 1 tablet on Wedesdays. Recheck in 4 weeks.

## 2022-07-04 NOTE — Telephone Encounter (Signed)
Discussed healthy dietary choices, reflux precautions and dietary suggestions for healthy liver. She also requests her Omeprazole be transmitted to [facility] for a 90 day supply. Her follow up appointment with Dr Lorenso Courier is 07/19/22.

## 2022-07-04 NOTE — Patient Instructions (Addendum)
Pre visit review using our clinic review tool, if applicable. No additional management support is needed unless otherwise documented below in the visit note.  Increase dose today to 3 tablets and then change weekly dose to take 2 tablets daily except 1 tablet on Wedesdays. Recheck in 4 weeks.

## 2022-07-06 ENCOUNTER — Other Ambulatory Visit: Payer: Self-pay | Admitting: Endocrinology

## 2022-07-06 ENCOUNTER — Other Ambulatory Visit: Payer: Self-pay | Admitting: Internal Medicine

## 2022-07-06 DIAGNOSIS — E063 Autoimmune thyroiditis: Secondary | ICD-10-CM

## 2022-07-13 ENCOUNTER — Other Ambulatory Visit: Payer: Self-pay

## 2022-07-13 MED ORDER — URSODIOL 300 MG PO CAPS: 600.0000 mg | ORAL_CAPSULE | Freq: Two times a day (BID) | ORAL | 1 refills | Status: DC

## 2022-07-19 ENCOUNTER — Encounter: Payer: Self-pay | Admitting: Internal Medicine

## 2022-07-19 ENCOUNTER — Ambulatory Visit (INDEPENDENT_AMBULATORY_CARE_PROVIDER_SITE_OTHER): Payer: Medicare Other | Admitting: Internal Medicine

## 2022-07-19 VITALS — BP 100/60 | HR 56 | Ht 67.5 in | Wt 223.0 lb

## 2022-07-19 DIAGNOSIS — R7989 Other specified abnormal findings of blood chemistry: Secondary | ICD-10-CM | POA: Diagnosis not present

## 2022-07-19 DIAGNOSIS — K219 Gastro-esophageal reflux disease without esophagitis: Secondary | ICD-10-CM

## 2022-07-19 DIAGNOSIS — Z23 Encounter for immunization: Secondary | ICD-10-CM | POA: Diagnosis not present

## 2022-07-19 MED ORDER — OMEPRAZOLE 40 MG PO CPDR: 40.0000 mg | DELAYED_RELEASE_CAPSULE | Freq: Every day | ORAL | 3 refills | Status: DC

## 2022-07-19 MED ORDER — URSODIOL 300 MG PO CAPS: 600.0000 mg | ORAL_CAPSULE | Freq: Two times a day (BID) | ORAL | 2 refills | Status: DC

## 2022-07-19 NOTE — Progress Notes (Signed)
Chief Complaint: Hypoglycemia after gastric bypass, PBC  HPI : 76 year old female with history of asthma, DM, GERD, hypothyroidism, obesity s/p RYGB in 2011, DM, OSA, PE/DVT on warfarin, breast cancer presents for follow up of hypoglycemia episodes and PBC  Interval History: Blood sugars are doing well. She is gaining weight and eating well. Denies ab pain or blood in stools. She is doing well on the ursodiol. Her reflux symptoms are under good control. She is following a low salt diet. She has difficulty wearing compression stockings because this feels too tight on her legs.   Wt Readings from Last 3 Encounters:  07/19/22 223 lb (101.2 kg)  06/12/22 222 lb 9.6 oz (101 kg)  05/29/22 209 lb 7 oz (95 kg)   Current Outpatient Medications  Medication Sig Dispense Refill   acarbose (PRECOSE) 50 MG tablet 1 tablet at start of breakfast and lunch and at dinnertime 270 tablet 3   albuterol (PROAIR HFA) 108 (90 Base) MCG/ACT inhaler Inhale 2 puffs into the lungs every 6 (six) hours as needed for wheezing or shortness of breath. 24 g 3   baclofen (LIORESAL) 20 MG tablet TAKE 1 TABLET THREE TIMES A DAY (UPDATED PRESCRIPTION) 270 tablet 3   Blood Glucose Monitoring Suppl (FREESTYLE FREEDOM LITE) w/Device KIT Use as directed to check blood sugar once daily. 1 kit 0   Calcium Carbonate-Vitamin D 600-400 MG-UNIT tablet Take 1 tablet by mouth daily.     clobetasol cream (TEMOVATE) 2.59 % APPLY 1 APPLICATION TOPICALLY TWICE A DAY 120 g 5   fluticasone (FLONASE) 50 MCG/ACT nasal spray Place 2 sprays into both nostrils daily. 48 g 3   fluticasone-salmeterol (ADVAIR DISKUS) 100-50 MCG/ACT AEPB USE 1 INHALATION EVERY 12 HOURS, RINSE MOUTH AFTER USING 180 each 3   FREESTYLE LITE test strip USE AS INSTRUCTED TO CHECK BLOOD SUGAR ONCE DAILY 100 strip 3   furosemide (LASIX) 80 MG tablet Take 80 mg by mouth daily.     gabapentin (NEURONTIN) 600 MG tablet TAKE 1 TABLET THREE TIMES A DAY (DOSE CHANGE) 270 tablet 3    HYDROcodone-acetaminophen (NORCO) 5-325 MG tablet Take 1 tablet by mouth 2 (two) times daily as needed for moderate pain. (Patient taking differently: Take 0.5-1 tablets by mouth 3 (three) times daily as needed for moderate pain or severe pain.) 50 tablet 0   Lancets (FREESTYLE) lancets USE AS INSTRUCTED TO CHECK BLOOD SUGAR ONCE DAILY 100 each 1   levothyroxine (SYNTHROID) 100 MCG tablet TAKE 1 TABLET DAILY BEFORE BREAKFAST (NEED WHEN 88 MCG OUT) 90 tablet 3   Magnesium Cl-Calcium Carbonate (SLOW-MAG PO) Take 500 mg by mouth at bedtime.      methylcellulose (ARTIFICIAL TEARS) 1 % ophthalmic solution Place 1 drop into both eyes as needed. Dry eyes     omeprazole (PRILOSEC) 40 MG capsule TAKE 1 CAPSULE IN THE MORNING AND AT BEDTIME 90 capsule 7   potassium chloride SA (KLOR-CON M) 20 MEQ tablet Take 2 tablets (40 mEq total) by mouth 2 (two) times daily. 360 tablet 3   pravastatin (PRAVACHOL) 10 MG tablet TAKE 1 TABLET DAILY 90 tablet 3   spironolactone (ALDACTONE) 100 MG tablet TAKE 1 TABLET DAILY 30 tablet 11   temazepam (RESTORIL) 15 MG capsule 1-2 for sleep as needed 90 capsule 1   ursodiol (ACTIGALL) 300 MG capsule Take 2 capsules (600 mg total) by mouth 2 (two) times daily. 360 capsule 1   vitamin B-12 (CYANOCOBALAMIN) 500 MCG tablet Take 500 mcg by  mouth every other day.     Vitamin D, Ergocalciferol, 50000 units CAPS Take 1 capsule by mouth once a week. 12 capsule 3   warfarin (COUMADIN) 5 MG tablet TAKE 2 TABLETS DAILY EXCEPT TAKE 1 TABLET ON WEDNESDAYS AND SATURDAYS OR TAKE AS DIRECTED BY ANTICOAGULATION CLINIC 205 tablet 3   No current facility-administered medications for this visit.   Physical Exam: Pulse (!) 56   Ht 5' 7.5" (1.715 m)   Wt 223 lb (101.2 kg)   BMI 34.41 kg/m  Constitutional: Pleasant,well-developed, female in no acute distress. HEENT: Normocephalic and atraumatic. Conjunctivae are normal. No scleral icterus. Cardiovascular: Normal rate, regular rhythm.   Pulmonary/chest: Effort normal and breath sounds normal. No wheezing, rales or rhonchi. Abdominal: Soft, nondistended, nontender. Bowel sounds active throughout. There are no masses palpable. No hepatomegaly. Extremities: 2+ BLE edema Neurological: Alert and oriented to person place and time. Skin: Skin is warm and dry. No rashes noted. Psychiatric: Normal mood and affect. Behavior is normal.  Labs 06/2021: CBC nml  Labs 10/2021: Negative Cologuard  Labs 11/2021: HbA1C 6.9%.   Labs 12/2021: CMP with elevated Cr of 1.26 and BUN of 36. AST and ALT are elevated to 69 and 52, respectively. Alk phos and T bili are normal. TSH nml.   Labs 01/2022: CBC with mildly low Hb of 11.7. ANA positive at 1:320. CBC low at 11.7. INR elevated at 2.2. IgG nml. ASMA negative. AMA positive for 101.3. Hep B surface antibody NR. Hep B surface antigen NR. Hep C antibody NR. Hep A antibody positive. IBC ferritin with mildly low iron sat of 11.7%  Labs 04/2022: CBC with low Hb of 11.2. CMP with elevated Cr of 1.02. INR nml.   CT A/P w/contrast 11/05/14: IMPRESSION: Status post right mastectomy with right axillary lymph node dissection. Radiation changes in the right upper lobe. No evidence of recurrent or metastatic disease. Postsurgical changes related to gastric bypass, cholecystectomy, and hysterectomy. IVC filter. Additional ancillary findings as above.  TTE 08/25/21: IMPRESSIONS   1. Left ventricular ejection fraction, by estimation, is 55 to 60%. The left ventricle has normal function. The left ventricle has no regional wall motion abnormalities. Left ventricular diastolic parameters were normal. The average left ventricular global longitudinal strain is -22.9 %. The global longitudinal strain is normal.   2. Right ventricular systolic function is normal. The right ventricular size is normal. There is normal pulmonary artery systolic pressure. The estimated right ventricular systolic pressure is 81.1 mmHg.    3. The mitral valve is abnormal. Mild mitral valve regurgitation. Moderate mitral annular calcification.   4. The aortic valve is tricuspid. There is moderate calcification of the aortic valve. There is moderate thickening of the aortic valve. Aortic valve regurgitation is not visualized. Aortic valve sclerosis/calcification is present, without any evidence of aortic stenosis.   5. The inferior vena cava is normal in size with <50% respiratory variability, suggesting right atrial pressure of 8 mmHg.   RUQ U/S 02/23/22: IMPRESSION: 1. Increased echogenicity is identified in the liver. This is a nonspecific finding but often seen with hepatic steatosis. Nonspecific causes of hepatocellular disease may result in the same findings. 2. No other abnormalities.  Liver biopsy 05/29/22: A. LIVER, BIOPSY: - Patchy portal-based inflammation and minimal lobular inflammation; see comment - Mild fibrosis (stage 1 of 4) COMMENT: Biopsy shows liver parenchyma with preserved architecture. A mononuclear cell infiltrate, moderate focally marked in degree, expands some of the portal tracts in a patchy manner.  This infiltrate  is composed principally of small lymphocytes, plasma cells and eosinophils. There is associated bile ductular injury with lymphocytic cholangitis and focal 'florid duct lesion'.  Hepatic lobules show rare foci of necroinflammation and isolated apoptotic hepatocytes.  Trichrome stain shows patchy portal fibrous expansion.  Reticulin stain highlights thenormal trabecular architecture.  PASD stain shows no globular inclusions within hepatocytes.  Iron stain shows minimal granular iron deposition within hepatocytes and moderate staining within histiocytes and Kupffer cells. The findings are compatible with primary biliary cholangitis (PBC), especially with patient's clinical finding of positive M2 anti-mitochondrial antibody test.  Histologic features do not support overlapping autoimmune  hepatitis.  Clinical and serological correlation is suggested.   EGD 03/03/22: - Normal esophagus. - Small hiatal hernia. - Gastritis. Biopsied. - Two gastric polyps. Resected and retrieved. - Roux-en-Y gastrojejunostomy with gastrojejunal anastomosis characterized by healthy appearing mucosa. - Normal examined jejunum. Biopsied. Path: 1. Surgical [P], 2nd portion of duodenum - DUODENAL MUCOSA WITH NO SPECIFIC HISTOPATHOLOGIC CHANGES - NEGATIVE FOR INCREASED INTRAEPITHELIAL LYMPHOCYTES OR VILLOUS ARCHITECTURAL CHANGES 2. Surgical [P], gastric antrum and gastric body - GASTRIC ANTRAL AND OXYNTIC MUCOSA SHOWING REACTIVE GASTROPATHY WITH SUBEPITHELIAL CRYSTALLINE DEPOSITS, CONSISTENT WITH IRON PILL GASTROPATHY - HELICOBACTER PYLORI-LIKE ORGANISMS ARE NOT IDENTIFIED ON ROUTINE H&E STAIN 3. Surgical [P], gastric body, polyp (2) - GASTRIC HYPERPLASTIC POLYP(S) - NEGATIVE FOR INTESTINAL METAPLASIA OR DYSPLASIA  ASSESSMENT AND PLAN: Primary biliary cholangitis Postprandial hypoglycemia S/p RYGB Iron pill gastritis Gastric hyperplastic polyps GERD Elevated LFTs Patient was recently diagnosed with PBC based upon liver biopsy. Luckily her biopsy shows that she has low grade fibrosis (stage 1 out of 4), suggesting that she does not have advanced liver disease. Thus I do not think that her liver disease is contributing to her BLE edema. Instead her history of DVTs seems to be the more likely etiology. She is doing well on therapy for PBC with ursodiol so will maintain this. Will start her hepatitis B vaccines series. Patient has been doing well from a GERD standpoint so will have her decrease her omeprazole from BID to QD. - Continue ursodiol 600 mg BID. Refilled. - Drop PPI from BID to QD - Start hepatitis B vaccine series - Previously counseled on avoiding simple sugars and limiting carbohydrate intake to less than 30 grams per meal. Can try to eat smaller meals and aim to eat more protein. -  Repeat EGD due in 03/2023 - RTC 6 months  Christia Reading, MD  I spent 41 minutes of time, including in depth chart review, independent review of results as outlined above, communicating results with the patient directly, face-to-face time with the patient, coordinating care, ordering studies and medications as appropriate, and documentation.

## 2022-07-19 NOTE — Patient Instructions (Addendum)
_______________________________________________________  If you are age 73 or older, your body mass index should be between 23-30. Your Body mass index is 34.41 kg/m. If this is out of the aforementioned range listed, please consider follow up with your Primary Care Provider. ________________________________________________________  The [facility] providers would like to encourage you to use [facility] to communicate with providers for non-urgent requests or questions.  Due to long hold times on the telephone, sending your provider a message by [facility] may be a faster and more efficient way to get a response.  Please allow 48 business hours for a response.  Please remember that this is for non-urgent requests.  _______________________________________________________  DECREASE Omeprazole to '40mg'$  daily  We have sent the following medications to your pharmacy for you to pick up at your convenience: Ursodiol  You will need a follow up appointment in 6 months (June 2024).  We will contact you to schedule this appointment.   You are scheduled on 08-21-22 at 10am for your 2nd Hepatitis B vaccine.  Thank you for entrusting me with your care and choosing [facility].  Dr Lorenso Courier

## 2022-07-19 NOTE — Addendum Note (Signed)
Addended by: Stevan Born on: 07/19/2022 01:49 PM   Modules accepted: Orders

## 2022-07-21 ENCOUNTER — Encounter: Payer: Medicare Other | Admitting: Internal Medicine

## 2022-08-01 ENCOUNTER — Ambulatory Visit (INDEPENDENT_AMBULATORY_CARE_PROVIDER_SITE_OTHER): Payer: Medicare Other

## 2022-08-01 DIAGNOSIS — Z7901 Long term (current) use of anticoagulants: Secondary | ICD-10-CM

## 2022-08-01 LAB — POCT INR: INR: 2.8 (ref 2.0–3.0)

## 2022-08-01 NOTE — Patient Instructions (Addendum)
Pre visit review using our clinic review tool, if applicable. No additional management support is needed unless otherwise documented below in the visit note.  Continue 2 tablets daily except 1 tablet on Wedesdays. Recheck in 6 weeks.

## 2022-08-01 NOTE — Progress Notes (Signed)
Continue 2 tablets daily except 1 tablet on Wedesdays. Recheck in 6 weeks.

## 2022-08-04 ENCOUNTER — Encounter (INDEPENDENT_AMBULATORY_CARE_PROVIDER_SITE_OTHER): Payer: Medicare Other | Admitting: Ophthalmology

## 2022-08-04 DIAGNOSIS — H35033 Hypertensive retinopathy, bilateral: Secondary | ICD-10-CM | POA: Diagnosis not present

## 2022-08-04 DIAGNOSIS — H43811 Vitreous degeneration, right eye: Secondary | ICD-10-CM | POA: Diagnosis not present

## 2022-08-04 DIAGNOSIS — H35372 Puckering of macula, left eye: Secondary | ICD-10-CM

## 2022-08-04 DIAGNOSIS — H33302 Unspecified retinal break, left eye: Secondary | ICD-10-CM | POA: Diagnosis not present

## 2022-08-04 DIAGNOSIS — H353211 Exudative age-related macular degeneration, right eye, with active choroidal neovascularization: Secondary | ICD-10-CM

## 2022-08-04 DIAGNOSIS — I10 Essential (primary) hypertension: Secondary | ICD-10-CM

## 2022-08-07 DIAGNOSIS — M1711 Unilateral primary osteoarthritis, right knee: Secondary | ICD-10-CM | POA: Diagnosis not present

## 2022-08-07 DIAGNOSIS — M1712 Unilateral primary osteoarthritis, left knee: Secondary | ICD-10-CM | POA: Diagnosis not present

## 2022-08-07 DIAGNOSIS — M170 Bilateral primary osteoarthritis of knee: Secondary | ICD-10-CM | POA: Diagnosis not present

## 2022-08-08 ENCOUNTER — Other Ambulatory Visit: Payer: Self-pay | Admitting: Endocrinology

## 2022-08-11 ENCOUNTER — Telehealth: Payer: Self-pay | Admitting: Oncology

## 2022-08-11 NOTE — Telephone Encounter (Signed)
Called patient regarding providers departure, patient is notified. Patient has been rescheduled with new provider.

## 2022-08-21 ENCOUNTER — Ambulatory Visit (INDEPENDENT_AMBULATORY_CARE_PROVIDER_SITE_OTHER): Payer: Medicare Other | Admitting: Internal Medicine

## 2022-08-21 DIAGNOSIS — Z23 Encounter for immunization: Secondary | ICD-10-CM

## 2022-08-21 NOTE — Progress Notes (Signed)
Patient came in today for her 2nd Heplisav injection.  She tolerated the shot well, was given a VIS and instructed to call back with any questions or concerns

## 2022-08-24 ENCOUNTER — Telehealth (INDEPENDENT_AMBULATORY_CARE_PROVIDER_SITE_OTHER): Payer: Medicare Other | Admitting: Family Medicine

## 2022-08-24 DIAGNOSIS — R600 Localized edema: Secondary | ICD-10-CM | POA: Diagnosis not present

## 2022-08-24 DIAGNOSIS — R252 Cramp and spasm: Secondary | ICD-10-CM

## 2022-08-24 DIAGNOSIS — M5432 Sciatica, left side: Secondary | ICD-10-CM | POA: Diagnosis not present

## 2022-08-24 NOTE — Progress Notes (Signed)
Virtual Visit via Video Note  I connected with Kelli Martinez on 08/24/22 at 10:30 AM EST by a video enabled telemedicine application 2/2 [id number] pandemic and verified that I am speaking with the correct person using two identifiers.  Location patient: home Location provider:work or home office Persons participating in the virtual visit: patient, provider  I discussed the limitations of evaluation and management by telemedicine and the availability of in person appointments. The patient expressed understanding and agreed to proceed.   HPI: Patient is a 77 year old female with pmh sig for venous insufficiency, history of PE, seasonal allergies, OSA, hypothyroidism, history of breast cancer, history of Roux-en-Y, history of anemia who was seen for follow-up.  Pt with b/l LE edema.  Wwas taking lasix 80 mg BID per Cardiology.  Pt had increased cramping and muscle spasms, but would increase fluid intake and keep moving which helped.  Weight got down to under 200 lbs and in a size 12. Slight increase in creat. Dr. Dwyane Dee decreased lasix to 80 mg daily due to kidney function. LFTs were increased.  Sent to GI, Dr. Lorenso Courier.  Had EGD, two polyps removed from stomach.  Was told she had "fluid in stomach".  Pt had liver testing.  Told she had liver disease.  Started on Ursodiol.  Had Hep B vaccine on Monday with GI.  Pt does not like driving due to eyesight dim 2/2 cataract.  Pt can't wear compression socks as they dig into her calf.  Unable to wrap her legs by herself.  Pt recently started taking lasix 20 mg in am and 80 in pm.  Pt states hydrocodone-Tylenol 1/2 tab for pain.  ROS: See pertinent positives and negatives per HPI.  Past Medical History:  Diagnosis Date   Allergy    Anemia    Arthritis    Asthma    Back pain, chronic    GETS INJECTIONS IN BACK   Blind right eye    hemorrhage   Blood transfusion    Cancer ([place])    breast 1994   Clotting disorder ([place])    Diabetes  mellitus without complication ([place])    takes precose   Elevated liver enzymes    Gallstones    GERD (gastroesophageal reflux disease)    HX: breast cancer    Hyperlipidemia    Hypertension    Hypothyroid    Lymphedema of arm    RT   Morbid obesity ([facility])    OSA (obstructive sleep apnea)    has not used in 2 years-lost weight   Osteoporosis    Peripheral neuropathy    on gabapentin   PONV (postoperative nausea and vomiting)    Psoriasis    Pulmonary embolism ([place]) 1998 / 1994 /1968   Syncope and collapse 2011   due to anemia    Past Surgical History:  Procedure Laterality Date   ABDOMINAL HYSTERECTOMY     BONE MARROW TRANSPLANT  1994   CARDIOVASCULAR STRESS TEST  05/23/2005   EF 53%   carpal tunnell     bil   cataracts     CHOLECYSTECTOMY  05/10/2012   Procedure: LAPAROSCOPIC CHOLECYSTECTOMY WITH INTRAOPERATIVE CHOLANGIOGRAM;  Surgeon: Pedro Earls, MD;  Location: [facility];  Service: General;  Laterality: N/A;  Laparoscopic Cholecystectomy with Intraoperative Cholangiogram   GASTRIC BYPASS  2011   bariatric surgery   HIATAL HERNIA REPAIR     IVC filter     recurrent DVT   KNEE ARTHROTOMY  1998   MASS EXCISION  Right 11/24/2016   Procedure: EXCISION RIGHT AXILLARY LESION AND CHEST WALL LESION;  Surgeon: Johnathan Hausen, MD;  Location: [facility];  Service: General;  Laterality: Right;   MASTECTOMY     Right   US ECHOCARDIOGRAPHY  10/27/08   EF 55-60%    Family History  Problem Relation Age of Onset   Sudden death Mother        car accident   Cancer Father 9       lung   Cancer Sister 73       lung cancer   Asthma Daughter    Colon cancer Neg Hx    Stomach cancer Neg Hx    Esophageal cancer Neg Hx    Colon polyps Neg Hx      Current Outpatient Medications:    acarbose (PRECOSE) 50 MG tablet, 1 tablet at start of breakfast and lunch and at dinnertime, Disp: 270 tablet, Rfl: 3   albuterol (PROAIR HFA) 108 (90 Base) MCG/ACT inhaler, Inhale 2  puffs into the lungs every 6 (six) hours as needed for wheezing or shortness of breath., Disp: 24 g, Rfl: 3   baclofen (LIORESAL) 20 MG tablet, TAKE 1 TABLET THREE TIMES A DAY (UPDATED PRESCRIPTION), Disp: 270 tablet, Rfl: 3   Blood Glucose Monitoring Suppl (FREESTYLE FREEDOM LITE) w/Device KIT, Use as directed to check blood sugar once daily., Disp: 1 kit, Rfl: 0   Calcium Carbonate-Vitamin D 600-400 MG-UNIT tablet, Take 1 tablet by mouth daily., Disp: , Rfl:    clobetasol cream (TEMOVATE) [id number] %, APPLY 1 APPLICATION TOPICALLY TWICE A DAY, Disp: 120 g, Rfl: 5   fluticasone (FLONASE) 50 MCG/ACT nasal spray, Place 2 sprays into both nostrils daily., Disp: 48 g, Rfl: 3   fluticasone-salmeterol (ADVAIR DISKUS) 100-50 MCG/ACT AEPB, USE 1 INHALATION EVERY 12 HOURS, RINSE MOUTH AFTER USING, Disp: 180 each, Rfl: 3   FREESTYLE LITE test strip, USE AS INSTRUCTED TO CHECK BLOOD SUGAR ONCE DAILY, Disp: 100 strip, Rfl: 3   furosemide (LASIX) 80 MG tablet, Take 80 mg by mouth daily., Disp: , Rfl:    gabapentin (NEURONTIN) 600 MG tablet, TAKE 1 TABLET THREE TIMES A DAY (DOSE CHANGE), Disp: 270 tablet, Rfl: 3   HYDROcodone-acetaminophen (NORCO) 5-325 MG tablet, Take 1 tablet by mouth 2 (two) times daily as needed for moderate pain. (Patient taking differently: Take 0.5-1 tablets by mouth 3 (three) times daily as needed for moderate pain or severe pain.), Disp: 50 tablet, Rfl: 0   Lancets (FREESTYLE) lancets, USE AS INSTRUCTED TO CHECK BLOOD SUGAR ONCE DAILY, Disp: 100 each, Rfl: 1   levothyroxine (SYNTHROID) 100 MCG tablet, TAKE 1 TABLET DAILY BEFORE BREAKFAST (NEED WHEN 88 MCG OUT), Disp: 90 tablet, Rfl: 3   Magnesium Cl-Calcium Carbonate (SLOW-MAG PO), Take 500 mg by mouth at bedtime. , Disp: , Rfl:    methylcellulose (ARTIFICIAL TEARS) 1 % ophthalmic solution, Place 1 drop into both eyes as needed. Dry eyes, Disp: , Rfl:    omeprazole (PRILOSEC) 40 MG capsule, Take 1 capsule (40 mg total) by mouth daily., Disp:  90 capsule, Rfl: 3   potassium chloride SA (KLOR-CON M) 20 MEQ tablet, Take 2 tablets (40 mEq total) by mouth 2 (two) times daily., Disp: 360 tablet, Rfl: 3   pravastatin (PRAVACHOL) 10 MG tablet, TAKE 1 TABLET DAILY, Disp: 90 tablet, Rfl: 3   spironolactone (ALDACTONE) 100 MG tablet, TAKE 1 TABLET DAILY, Disp: 30 tablet, Rfl: 11   temazepam (RESTORIL) 15 MG capsule, 1-2 for  sleep as needed, Disp: 90 capsule, Rfl: 1   ursodiol (ACTIGALL) 300 MG capsule, Take 2 capsules (600 mg total) by mouth 2 (two) times daily., Disp: 360 capsule, Rfl: 2   vitamin B-12 (CYANOCOBALAMIN) 500 MCG tablet, Take 500 mcg by mouth every other day., Disp: , Rfl:    Vitamin D, Ergocalciferol, 50000 units CAPS, Take 1 capsule by mouth once a week., Disp: 12 capsule, Rfl: 3   warfarin (COUMADIN) 5 MG tablet, TAKE 2 TABLETS DAILY EXCEPT TAKE 1 TABLET ON WEDNESDAYS AND SATURDAYS OR TAKE AS DIRECTED BY ANTICOAGULATION CLINIC, Disp: 205 tablet, Rfl: 3  EXAM:  VITALS per patient if applicable: wt 123XX123 lbs yesterday. RR between 12-20 bpm.  GENERAL: alert, oriented, appears well and in no acute distress  HEENT: atraumatic, conjunctiva clear, no obvious abnormalities on inspection of external nose and ears  NECK: normal movements of the head and neck  LUNGS: on inspection no signs of respiratory distress, breathing rate appears normal, no obvious gross SOB, gasping or wheezing  CV: no obvious cyanosis  MS: moves all visible extremities without noticeable abnormality  PSYCH/NEURO: pleasant and cooperative, no obvious depression or anxiety, speech and thought processing grossly intact  ASSESSMENT AND PLAN:  Discussed the following assessment and plan:  Bilateral lower extremity edema  Sciatica of left side  Muscle cramping   Take lasix 80 mg in am and 40 mg in pm.  Monitor wt daily.  If 3 lbs wt gain in a day of 5 lbs in a wk notify clinic.  Advised on need for labs to monitor potassium.  Continue potassium  supplement.  HH referral for nurse to wrap legs.  Unable to wear TED hose or compression socks.  Follow-up in the next few weeks or with cards  I discussed the assessment and treatment plan with the patient. The patient was provided an opportunity to ask questions and all were answered. The patient agreed with the plan and demonstrated an understanding of the instructions.   The patient was advised to call back or seek an in-person evaluation if the symptoms worsen or if the condition fails to improve as anticipated.  I provided 25 minutes of non-face-to-face time during this encounter.   Billie Ruddy, MD

## 2022-09-01 ENCOUNTER — Other Ambulatory Visit: Payer: Self-pay | Admitting: Internal Medicine

## 2022-09-06 ENCOUNTER — Telehealth: Payer: Self-pay | Admitting: Family Medicine

## 2022-09-06 DIAGNOSIS — I872 Venous insufficiency (chronic) (peripheral): Secondary | ICD-10-CM

## 2022-09-06 DIAGNOSIS — R252 Cramp and spasm: Secondary | ICD-10-CM

## 2022-09-06 NOTE — Telephone Encounter (Addendum)
Pt called to request a refill of the following:  furosemide (LASIX) 80 MG tablet  Also, Pt says she knows another MD prescribed the potassium chloride SA (KLOR-CON M) 20 MEQ tablet,  but she is no longer seeing that MD, and would like to know if MD could send a refill of that, as well.    [place], [place] Phone: [phone]  Fax: [phone]      LOV:  09/15/21 LVV:  08/24/22  Please advise.

## 2022-09-07 MED ORDER — POTASSIUM CHLORIDE CRYS ER 20 MEQ PO TBCR: 40.0000 meq | EXTENDED_RELEASE_TABLET | Freq: Two times a day (BID) | ORAL | 3 refills | Status: DC

## 2022-09-07 NOTE — Telephone Encounter (Signed)
Lasix refilled yesterday by Dr. Harl Bowie.  Okay to send in refill for [place].

## 2022-09-07 NOTE — Telephone Encounter (Signed)
Rx sent 

## 2022-09-12 ENCOUNTER — Other Ambulatory Visit (INDEPENDENT_AMBULATORY_CARE_PROVIDER_SITE_OTHER): Payer: Medicare Other

## 2022-09-12 ENCOUNTER — Ambulatory Visit (INDEPENDENT_AMBULATORY_CARE_PROVIDER_SITE_OTHER): Payer: Medicare Other

## 2022-09-12 ENCOUNTER — Other Ambulatory Visit: Payer: Medicare Other

## 2022-09-12 DIAGNOSIS — Z7901 Long term (current) use of anticoagulants: Secondary | ICD-10-CM | POA: Diagnosis not present

## 2022-09-12 DIAGNOSIS — E1142 Type 2 diabetes mellitus with diabetic polyneuropathy: Secondary | ICD-10-CM

## 2022-09-12 DIAGNOSIS — E063 Autoimmune thyroiditis: Secondary | ICD-10-CM

## 2022-09-12 LAB — COMPREHENSIVE METABOLIC PANEL
ALT: 17 U/L (ref 0–35)
AST: 24 U/L (ref 0–37)
Albumin: 4.3 g/dL (ref 3.5–5.2)
Alkaline Phosphatase: 96 U/L (ref 39–117)
BUN: 29 mg/dL — ABNORMAL HIGH (ref 6–23)
CO2: 29 mEq/L (ref 19–32)
Calcium: 9.9 mg/dL (ref 8.4–10.5)
Chloride: 98 mEq/L (ref 96–112)
Creatinine, Ser: 1.15 mg/dL (ref 0.40–1.20)
GFR: 46.85 mL/min — ABNORMAL LOW (ref >60.00)
Glucose, Bld: 122 mg/dL — ABNORMAL HIGH (ref 70–99)
Potassium: 4.2 mEq/L (ref 3.5–5.1)
Sodium: 136 mEq/L (ref 135–145)
Total Bilirubin: 0.3 mg/dL (ref 0.2–1.2)
Total Protein: 7.6 g/dL (ref 6.0–8.3)

## 2022-09-12 LAB — TSH: TSH: 4.57 u[IU]/mL (ref 0.35–5.50)

## 2022-09-12 LAB — HEMOGLOBIN A1C: Hgb A1c MFr Bld: 7 % — ABNORMAL HIGH (ref 4.6–6.5)

## 2022-09-12 LAB — POCT INR: INR: 1.7 — AB (ref 2.0–3.0)

## 2022-09-12 NOTE — Progress Notes (Unsigned)
Subjective:    Patient ID: Kelli Martinez, female    DOB: 05-29-1946, 77 y.o.   MRN: [id number]  HPI F former smoker followed for OSA/ quit CPAP, allergic rhinitis,asthma,  hx pulm embolism/DVT/filter/coumadin(Dr Dwyane Dee), hx R breast Ca/ mastectomy/ xrt/chem/bonemarrow transplant at [facility], hx gastric bypass, DM 2 Office Spirometry 04/27/2015-WNL-FEV1/FVC 0.81, FEV1 2.50/110% HST- 12/31/19- AHI 9.1/ hr, desaturation to 88%, body weight 219 lbs ---------------------------------------------------------------------------------------- 09/13/21- 75 yoF former smoker followed for Allergic Rhinitis, Asthma, OSA/ quit CPAP, insomnia,   hx pulm embolism/DVT/filter/coumadin(Dr Dwyane Dee), hx R breast Ca/ mastectomy/ xrt/chem/bone marrow transplant at [place], hx gastric bypass, DM 2, Chronic peripheral Edema Dr Dwyane Dee manages peripheral edema. -Albuterol hfa, Advair 100,  Body weight today- Covid vax- 3 Moderna Flu vax-had -----Patient has a cough and sinus drainage. Productive cough with clear sputum. Sinuses were clear on head CT in December. Daughter here. Bothersome postnasal drip and cough with clear mucus x 2 weeks. No evident infection. Using Flonase. Needs inhalers refilled. Minimizes antihistamine due to dry eyes. Willing to tolerate blood sugar rise for steroid inj. Consider ipratropium nasal spray next. CXR 07/08/21- FINDINGS: Normal heart size. Mediastinal contours are unremarkable. No signs of pleural effusion or edema. No airspace opacities identified. Surgical clips noted within the right axilla. IMPRESSION: No active cardiopulmonary abnormalities.  09/14/21- 76 yoF former smoker followed for Allergic Rhinitis, Asthma, OSA/ quit CPAP, insomnia,   hx pulm embolism/DVT/filter/coumadin(Dr Dwyane Dee), hx R breast Ca/ mastectomy/ xrt/chem/bone marrow transplant at [place], hx gastric bypass, DM 2, Chronic peripheral Edema, Dr Dwyane Dee manages peripheral edema. -Albuterol hfa, Advair 100, temazepam 15,  Body  weight today- Covid vax- 3 Moderna Flu vax had ACT score 19 - ----States she has not been sleeping well.  She says over the last couple of months she has only been sleeping a few hours a night-unclear why.  Temazepam 15 mg not strong enough.  Catnaps in recliner some in the daytime.  She indicates she wants to go back to CPAP which we discussed. Bothersome postnasal drip.  She takes baby strength Claritin and Flonase.  Her problem is dry eyes, which do not tolerate adult strength antihistamines.  Review of Systems- see HPI + = positive Constitutional:   No-   weight loss, night sweats, fevers, chills, fatigue, lassitude. HEENT:   No-  headaches, difficulty swallowing, tooth/dental problems, sore throat,   +dry mouth      No-  sneezing, itching, ear ache,  +nasal congestion, +post nasal drip, +blind R eye CV:  No-   chest pain, orthopnea, PND, swelling in lower extremities, anasarca, dizziness, palpitations Resp: No-   shortness of breath with exertion or at rest.              No-   productive cough,   non-productive cough,  No-  coughing up of blood.              No-   change in color of mucus.   Skin: No-   rash or lesions. GI:  No-   heartburn, indigestion, abdominal pain, nausea, vomiting,  GU:  MS:  No-   joint pain,    swelling.   Neuro- : +HPI Psych:  No- change in mood or affect. No depression or anxiety.  No memory loss.   Objective:   Physical Exam General- Alert, Oriented, Affect-appropriate, Distress- none acute  + obese( most of weight is in her legs and hips) Skin- rash-none, lesions- none, excoriation- none. XRT skin changes right lateral neck Lymphadenopathy- none Head-  atraumatic            Eyes- +blind R eye,  clear conjunctivae            Ears- Hearing, canals, TMs normal            Nose- Clear, no- Septal dev, + sticky mucus, polyps, erosion, perforation             Throat- Mallampati III , mucosa clear , drainage- none, tonsils- atrophic, +missing teeth Neck-  flexible , trachea midline, no stridor , thyroid nl, carotid no bruit Chest - symmetrical excursion , unlabored           Heart/CV- RRR , no murmur , no gallop  , no rub, nl s1 s2                           - JVD+1, edema+1-2, stasis changes- , varices- none           Lung- clear to P&A, wheeze-none, cough- none , dullness-none, rub- none           Chest wall- + right mastectomy Abd-  Br/ Gen/ Rectal- Not done, not indicated Extrem- cyanosis- none, clubbing- none, atrophy- none, strength- nl   Heavy legs. + cane Neuro- grossly intact to observation  Assessment & Plan:

## 2022-09-12 NOTE — Progress Notes (Addendum)
Increase dose today to take 3 tablets and then continue 2 tablets daily except 1 tablet on Wedesdays. Recheck in 4 weeks per pt request.

## 2022-09-12 NOTE — Patient Instructions (Addendum)
Pre visit review using our clinic review tool, if applicable. No additional management support is needed unless otherwise documented below in the visit note.  Increase dose today to take 3 tablets and then continue 2 tablets daily except 1 tablet on Wedesdays. Recheck in 4 weeks.

## 2022-09-14 ENCOUNTER — Encounter: Payer: Self-pay | Admitting: Internal Medicine

## 2022-09-14 ENCOUNTER — Ambulatory Visit: Payer: Medicare Other | Admitting: Endocrinology

## 2022-09-14 ENCOUNTER — Ambulatory Visit (INDEPENDENT_AMBULATORY_CARE_PROVIDER_SITE_OTHER): Payer: Medicare Other | Admitting: Internal Medicine

## 2022-09-14 VITALS — BP 124/82 | HR 72 | Ht 67.5 in | Wt 222.0 lb

## 2022-09-14 DIAGNOSIS — J3089 Other allergic rhinitis: Secondary | ICD-10-CM | POA: Diagnosis not present

## 2022-09-14 DIAGNOSIS — F5101 Primary insomnia: Secondary | ICD-10-CM

## 2022-09-14 DIAGNOSIS — H401123 Primary open-angle glaucoma, left eye, severe stage: Secondary | ICD-10-CM | POA: Diagnosis not present

## 2022-09-14 DIAGNOSIS — H348322 Tributary (branch) retinal vein occlusion, left eye, stable: Secondary | ICD-10-CM | POA: Diagnosis not present

## 2022-09-14 DIAGNOSIS — G4733 Obstructive sleep apnea (adult) (pediatric): Secondary | ICD-10-CM | POA: Diagnosis not present

## 2022-09-14 DIAGNOSIS — Z961 Presence of intraocular lens: Secondary | ICD-10-CM | POA: Diagnosis not present

## 2022-09-14 DIAGNOSIS — J302 Other seasonal allergic rhinitis: Secondary | ICD-10-CM

## 2022-09-14 DIAGNOSIS — Z9889 Other specified postprocedural states: Secondary | ICD-10-CM | POA: Diagnosis not present

## 2022-09-14 DIAGNOSIS — H31091 Other chorioretinal scars, right eye: Secondary | ICD-10-CM | POA: Diagnosis not present

## 2022-09-14 MED ORDER — ALBUTEROL SULFATE HFA 108 (90 BASE) MCG/ACT IN AERS: 2.0000 | INHALATION_SPRAY | Freq: Four times a day (QID) | RESPIRATORY_TRACT | 3 refills | Status: DC | PRN

## 2022-09-14 MED ORDER — FLUTICASONE-SALMETEROL 100-50 MCG/ACT IN AEPB: INHALATION_SPRAY | RESPIRATORY_TRACT | 3 refills | Status: DC

## 2022-09-14 MED ORDER — TEMAZEPAM 30 MG PO CAPS: ORAL_CAPSULE | ORAL | 1 refills | Status: DC

## 2022-09-14 NOTE — Assessment & Plan Note (Signed)
We may want to consider changing to a different product but for now we are going to let her try increasing to 30 mg temazepam as discussed.

## 2022-09-14 NOTE — Assessment & Plan Note (Signed)
We will try to get her back on CPAP auto 4-15

## 2022-09-14 NOTE — Assessment & Plan Note (Signed)
Perennial rhinitis now worsening as warm weather moves in.  She is going to see what she can do with adult strength Claritin and her Flonase.

## 2022-09-14 NOTE — Patient Instructions (Signed)
Order- D[facility]- replace old CPAP machine auto 4-15 mask of choice, humidifier, supplies, AirView/ card   We have increased temazepam to 30 mg and sent that along with refills for Proair and Advair inhalers to [facility]  Please call if we can help

## 2022-09-15 ENCOUNTER — Ambulatory Visit (INDEPENDENT_AMBULATORY_CARE_PROVIDER_SITE_OTHER): Payer: Medicare Other | Admitting: Endocrinology

## 2022-09-15 ENCOUNTER — Encounter: Payer: Self-pay | Admitting: Endocrinology

## 2022-09-15 ENCOUNTER — Telehealth: Payer: Self-pay | Admitting: Internal Medicine

## 2022-09-15 VITALS — BP 118/70 | HR 63 | Ht 67.5 in | Wt 221.6 lb

## 2022-09-15 DIAGNOSIS — E7800 Pure hypercholesterolemia, unspecified: Secondary | ICD-10-CM | POA: Diagnosis not present

## 2022-09-15 DIAGNOSIS — K912 Postsurgical malabsorption, not elsewhere classified: Secondary | ICD-10-CM

## 2022-09-15 DIAGNOSIS — E063 Autoimmune thyroiditis: Secondary | ICD-10-CM

## 2022-09-15 DIAGNOSIS — G4733 Obstructive sleep apnea (adult) (pediatric): Secondary | ICD-10-CM

## 2022-09-15 NOTE — Progress Notes (Signed)
Patient ID: Kelli Martinez, female   DOB: 12-May-1946, 77 y.o.   MRN: [id number]    Chief complaint: Followup of various chronic problems    History of Present Illness:  She is here for follow-up:  1.  Postprandial hypoglycemia related to dumping syndrome   She has had postprandial hypoglycemia starting a couple of years after her gastric bypass surgery Her symptoms of blood sugars are mostly a feeling of significant weakness, some shakiness.  She will use glucose tablets or juice in order to relieve the symptoms, recently she thinks that 1 or 2 glucose tablets are usually adequate She has been to the dietitian and has been instructed on balanced low-fat meals with enough protein consistently and restricting carbohydrates including fruits   Recent history:  She has been treated with acarbose 50 mg before meals She did try verapamil previously without benefit  She is taking her Precose as directed before meals, taking 50 mg before breakfast, 50 mg tablet at lunch and 50 mg every other day at dinnertime   Most recently appears to have relatively low sugars at times after lunch but may be related to eating more carbohydrates such as a full sandwich Usually not checking sugars after dinner and blood sugars are not getting low subsequently She has been regular with her Precose She is still eating yogurt in the morning for breakfast  Lowest readings recently was 58 at about 3 PM  No overnight hypoglycemia  Metformin was stopped previously because of hypoglycemia tendency   2.  SWELLING of the legs:  She has had significant problems with her legs and feet chronically She has been followed by PCP and by cardiologist  Taking Aldactone 100 mg daily  Also taking Lasix 80 mg-/40 mg and managed by PCP  Usually she does not like to wear compression stockings because of discomfort   Renal function and potassium stable  Lab Results  Component Value Date   K 4.2  09/12/2022   Lab Results  Component Value Date   CREATININE 1.15 09/12/2022   CREATININE 1.02 (H) 05/29/2022   CREATININE 0.99 05/17/2022     PROBLEM 3:  DIABETES:  This has been mild and well controlled since her gastric bypass surgery  The A1c usually higher than expected for her blood sugars, now 7%  Current management, blood sugar patterns: She has only been on acarbose for treatment to prevent hypoglycemia  Home blood sugars are being done sporadically and more in the morning She has 1 new low reading of 98 fasting but readings after breakfast fluctuate, highest 248 after eating grapes Blood sugars are below 100 later in the day and has a low sugar of 56 around 3 PM  Not able to exercise because of back and leg pains Has not been able to lose any weight recently  She was previously told to stop her Metformin  because of tendency to hypoglycemia symptoms  Glucose readings from monitor download as above    Wt Readings from Last 3 Encounters:  09/15/22 221 lb 9.6 oz (100.5 kg)  09/14/22 222 lb (100.7 kg)  07/19/22 223 lb (101.2 kg)    Lab Results  Component Value Date   HGBA1C 7.0 (H) 09/12/2022   HGBA1C 6.8 (H) 05/04/2022   HGBA1C 7.1 (H) 01/03/2022   Lab Results  Component Value Date   MICROALBUR <0.7 04/26/2021   LDLCALC 94 05/04/2022   CREATININE 1.15 09/12/2022   Lab Results  Component Value Date   HGB  11.2 (L) 05/29/2022      Other active problems: see review of systems     Allergies as of 09/15/2022       Reactions   Adhesive [tape] Rash        Medication List        Accurate as of September 15, 2022 10:32 AM. If you have any questions, ask your nurse or doctor.          acarbose 50 MG tablet Commonly known as: PRECOSE 1 tablet at start of breakfast and lunch and at dinnertime   albuterol 108 (90 Base) MCG/ACT inhaler Commonly known as: ProAir HFA Inhale 2 puffs into the lungs every 6 (six) hours as needed for wheezing or  shortness of breath.   baclofen 20 MG tablet Commonly known as: LIORESAL TAKE 1 TABLET THREE TIMES A DAY (UPDATED PRESCRIPTION)   Calcium Carbonate-Vitamin D 600-400 MG-UNIT tablet Take 1 tablet by mouth daily.   clobetasol cream 0.05 % Commonly known as: TEMOVATE APPLY 1 APPLICATION TOPICALLY TWICE A DAY   fluticasone 50 MCG/ACT nasal spray Commonly known as: FLONASE Place 2 sprays into both nostrils daily.   fluticasone-salmeterol 100-50 MCG/ACT Aepb Commonly known as: Advair Diskus USE 1 INHALATION EVERY 12 HOURS, RINSE MOUTH AFTER USING   FreeStyle Freedom Lite w/Device Kit Use as directed to check blood sugar once daily.   freestyle lancets USE AS INSTRUCTED TO CHECK BLOOD SUGAR ONCE DAILY   FREESTYLE LITE test strip Generic drug: glucose blood USE AS INSTRUCTED TO CHECK BLOOD SUGAR ONCE DAILY   furosemide 80 MG tablet Commonly known as: LASIX TAKE 1 TABLET TWICE A DAY   gabapentin 600 MG tablet Commonly known as: NEURONTIN TAKE 1 TABLET THREE TIMES A DAY (DOSE CHANGE)   HYDROcodone-acetaminophen 5-325 MG tablet Commonly known as: Norco Take 1 tablet by mouth 2 (two) times daily as needed for moderate pain. What changed:  how much to take when to take this reasons to take this   levothyroxine 100 MCG tablet Commonly known as: SYNTHROID TAKE 1 TABLET DAILY BEFORE BREAKFAST (NEED WHEN 88 MCG OUT)   methylcellulose 1 % ophthalmic solution Commonly known as: ARTIFICIAL TEARS Place 1 drop into both eyes as needed. Dry eyes   omeprazole 40 MG capsule Commonly known as: PRILOSEC Take 1 capsule (40 mg total) by mouth daily.   potassium chloride SA 20 MEQ tablet Commonly known as: KLOR-CON M Take 2 tablets (40 mEq total) by mouth 2 (two) times daily.   pravastatin 10 MG tablet Commonly known as: PRAVACHOL TAKE 1 TABLET DAILY   SLOW-MAG PO Take 500 mg by mouth at bedtime.   spironolactone 100 MG tablet Commonly known as: ALDACTONE TAKE 1 TABLET  DAILY   temazepam 30 MG capsule Commonly known as: RESTORIL 1-2 for sleep as needed   ursodiol 300 MG capsule Commonly known as: ACTIGALL Take 2 capsules (600 mg total) by mouth 2 (two) times daily.   vitamin B-12 500 MCG tablet Commonly known as: CYANOCOBALAMIN Take 500 mcg by mouth every other day.   Vitamin D (Ergocalciferol) 50000 units Caps Take 1 capsule by mouth once a week.   warfarin 5 MG tablet Commonly known as: COUMADIN Take as directed by the anticoagulation clinic. If you are unsure how to take this medication, talk to your nurse or doctor. Original instructions: TAKE 2 TABLETS DAILY EXCEPT TAKE 1 TABLET ON WEDNESDAYS AND SATURDAYS OR TAKE AS DIRECTED BY ANTICOAGULATION CLINIC        Allergies:  Allergies  Allergen Reactions   Adhesive [Tape] Rash    Past Medical History:  Diagnosis Date   Allergy    Anemia    Arthritis    Asthma    Back pain, chronic    GETS INJECTIONS IN BACK   Blind right eye    hemorrhage   Blood transfusion    Cancer ([place])    breast 1994   Clotting disorder ([place])    Diabetes mellitus without complication ([place])    takes precose   Elevated liver enzymes    Gallstones    GERD (gastroesophageal reflux disease)    HX: breast cancer    Hyperlipidemia    Hypertension    Hypothyroid    Lymphedema of arm    RT   Morbid obesity ([facility])    OSA (obstructive sleep apnea)    has not used in 2 years-lost weight   Osteoporosis    Peripheral neuropathy    on gabapentin   PONV (postoperative nausea and vomiting)    Psoriasis    Pulmonary embolism ([place]) 1998 / 1994 /1968   Syncope and collapse 2011   due to anemia    Past Surgical History:  Procedure Laterality Date   ABDOMINAL HYSTERECTOMY     BONE MARROW TRANSPLANT  1994   CARDIOVASCULAR STRESS TEST  05/23/2005   EF 53%   carpal tunnell     bil   cataracts     CHOLECYSTECTOMY  05/10/2012   Procedure: LAPAROSCOPIC CHOLECYSTECTOMY WITH INTRAOPERATIVE CHOLANGIOGRAM;   Surgeon: Pedro Earls, MD;  Location: [facility];  Service: General;  Laterality: N/A;  Laparoscopic Cholecystectomy with Intraoperative Cholangiogram   GASTRIC BYPASS  2011   bariatric surgery   HIATAL HERNIA REPAIR     IVC filter     recurrent DVT   KNEE ARTHROTOMY  1998   MASS EXCISION Right 11/24/2016   Procedure: EXCISION RIGHT AXILLARY LESION AND CHEST WALL LESION;  Surgeon: Johnathan Hausen, MD;  Location: [employer] [facility];  Service: General;  Laterality: Right;   MASTECTOMY     Right   US ECHOCARDIOGRAPHY  10/27/08   EF 55-60%    Family History  Problem Relation Age of Onset   Sudden death Mother        car accident   Cancer Father 52       lung   Cancer Sister 6       lung cancer   Asthma Daughter    Colon cancer Neg Hx    Stomach cancer Neg Hx    Esophageal cancer Neg Hx    Colon polyps Neg Hx     Social History:  reports that she quit smoking about 44 years ago. Her smoking use included cigarettes. She has a 5.00 pack-year smoking history. She has never used smokeless tobacco. She reports that she does not drink alcohol and does not use drugs.  Review of Systems    ABNORMAL liver functions:  ALT is high and was told by gastroenterologist that it is from acarbose However has been on acarbose for several years  Previously has had abnormal high ALT readings before transiently and no etiology found Not related to statins   Lab Results  Component Value Date   ALT 17 09/12/2022   ALT 25 01/19/2016     Neuropathy with persistent burning, mild numbness and lower leg pain:  She gets relief with nortriptyline and gabapentin  MUSCLE cramps/spasms:  These are treated with 10 mg baclofen during the day  twice daily and 20 mg at bedtime She also uses tonic water, mustard and magnesium supplements  No history of hypokalemia  History of mild hypothyroidism: Has been present for several years and minimally symptomatic usually  Current dose of levothyroxine  is 100 mcg She has taken her calcium separately in the afternoon as directed She does complain of feeling fatigued  TSH history:   Lab Results  Component Value Date   TSH 4.57 09/12/2022   TSH 2.65 05/04/2022   TSH 5.34 01/03/2022   FREET4 1.01 05/04/2022   FREET4 1.05 07/08/2021   FREET4 0.92 01/21/2021     Hypercholesterolemia: LDL has been at target with pravastatin 59m  Labs as below  Lipid levels:  Lab Results  Component Value Date   CHOL 210 (H) 05/04/2022   HDL 104.30 05/04/2022   LDLCALC 94 05/04/2022   TRIG 55.0 05/04/2022   CHOLHDL 2 05/04/2022   Lab Results  Component Value Date   ALT 17 09/12/2022   ALT 25 01/19/2016    No history of hypertension  BP Readings from Last 3 Encounters:  09/15/22 118/70  09/14/22 124/82  07/19/22 100/60     LABS:  Lab on 09/12/2022  Component Date Value Ref Range Status   TSH 09/12/2022 4.57  0.35 - 5.50 uIU/mL Final   Sodium 09/12/2022 136  135 - 145 mEq/L Final   Potassium 09/12/2022 4.2  3.5 - 5.1 mEq/L Final   Chloride 09/12/2022 98  96 - 112 mEq/L Final   CO2 09/12/2022 29  19 - 32 mEq/L Final   Glucose, Bld 09/12/2022 122 (H)  70 - 99 mg/dL Final   BUN 09/12/2022 29 (H)  6 - 23 mg/dL Final   Creatinine, Ser 09/12/2022 1.15  0.40 - 1.20 mg/dL Final   Total Bilirubin 09/12/2022 0.3  0.2 - 1.2 mg/dL Final   Alkaline Phosphatase 09/12/2022 96  39 - 117 U/L Final   AST 09/12/2022 24  0 - 37 U/L Final   ALT 09/12/2022 17  0 - 35 U/L Final   Total Protein 09/12/2022 7.6  6.0 - 8.3 g/dL Final   Albumin 09/12/2022 4.3  3.5 - 5.2 g/dL Final   GFR 09/12/2022 46.85 (L)  >60.00 mL/min Final   Calculated using the CKD-EPI Creatinine Equation (2021)   Calcium 09/12/2022 9.9  8.4 - 10.5 mg/dL Final   Hgb A1c MFr Bld 09/12/2022 7.0 (H)  4.6 - 6.5 % Final   Glycemic Control Guidelines for People with Diabetes:Non Diabetic:  <6%Goal of Therapy: <7%Additional Action Suggested:  >8%   Anti-coag visit on 09/12/2022   Component Date Value Ref Range Status   INR 09/12/2022 1.7 (A)  2.0 - 3.0 Final     EXAM:  BP 118/70 (BP Location: Left Arm, Patient Position: Sitting, Cuff Size: Normal)   Pulse 63   Ht 5' 7.5" (1.715 m)   Wt 221 lb 9.6 oz (100.5 kg)   SpO2 93%   BMI 34.20 kg/m    Assessment/Plan:   1.  HYPOTHYROIDISM: TSH is normal she will continue same dose and avoid taking calcium at the same time her levothyroxine   2. Reactive hypoglycemia secondary to gastrointestinal surgery:   Occasionally getting after lunch when she is getting more carbohydrates like bread Discussed cutting back on carbohydrate at lunch and adding more low calorie or low carbohydrate vegetables or salads  She needs to eat a snack between meals especially getting late for meals Continue acarbose with each meal as before  3. Mild diabetes: A1c 7 Most of her blood sugars are fairly good although not checking enough Discussed cutting out high glycemic index foods like grapes Will continue acarbose only  4.  Hypothyroidism: TSH is high normal but not clear if she is symptomatic She will take an extra half tablet weekly of levothyroxine  5.  No recent issues with hypokalemia or change in renal function   There are no Patient Instructions on file for this visit.  Elayne Snare 09/15/2022, 10:32 AM

## 2022-09-15 NOTE — Patient Instructions (Addendum)
1 pill extra 1/2 pill once a week  Only 1 serving of starch at lunch

## 2022-09-18 NOTE — Telephone Encounter (Signed)
Please advise Kelli Martinez that Medicare will require a new sleep study before she can restart CPAP.  Please schedule HST   dx OSA and ask her to call for report 2 weeks after.

## 2022-09-18 NOTE — Telephone Encounter (Signed)
Dr. Annamaria Boots please see Leroy Sea from [employer]'s message below  Message from [place] at [employer]: I was reviewing this order for face 2 face notes and it looks like patient quit cpap years ago and is wanting to re-start on cpap. Patient has Medicare insurance so she will need a new sleep study since old study is from 2021 with no face 2 face showing usage during that time.

## 2022-09-18 NOTE — Telephone Encounter (Signed)
Spoke with patient advised she would need a new sleep study per insurance to be able to restart cpap machine. Pt is aware order has been placed for HST. Nothing further needed.

## 2022-09-18 NOTE — Addendum Note (Signed)
Addended by: Meredith Staggers A on: 09/18/2022 09:23 AM   Modules accepted: Orders

## 2022-09-19 ENCOUNTER — Telehealth: Payer: Self-pay | Admitting: Family Medicine

## 2022-09-19 NOTE — Telephone Encounter (Signed)
Debbie, RN with [facility] [phone]  Verbal Order - Wound Care  Can access for edema or open skin areas - Eval and treat  Would need verbal ok from MD to address   Can go out to see Pt tomorrow, if MD okays orders

## 2022-09-20 DIAGNOSIS — E1151 Type 2 diabetes mellitus with diabetic peripheral angiopathy without gangrene: Secondary | ICD-10-CM | POA: Diagnosis not present

## 2022-09-20 DIAGNOSIS — L603 Nail dystrophy: Secondary | ICD-10-CM | POA: Diagnosis not present

## 2022-09-20 DIAGNOSIS — L84 Corns and callosities: Secondary | ICD-10-CM | POA: Diagnosis not present

## 2022-09-20 DIAGNOSIS — I739 Peripheral vascular disease, unspecified: Secondary | ICD-10-CM | POA: Diagnosis not present

## 2022-09-20 NOTE — Telephone Encounter (Addendum)
Spoke to [employer] in office and he stated that he left message below:  rom: Rozanna Box  Sent: 09/19/2022   7:20 AM EST  To: Billie Ruddy, MD; [place], so I do see a skilled need for the patient, but nursing cannot technically wrap patient's legs unless there is a skin tear/skin breakdown.    Can the orders be revised for Nursing - eval and treat (Pain management and disease education?)   Once Updated, I will be more than happy to see the patient :)    Dr. Volanda Napoleon notified of update. Ok verbal orders given to [employer]

## 2022-09-21 DIAGNOSIS — K219 Gastro-esophageal reflux disease without esophagitis: Secondary | ICD-10-CM | POA: Diagnosis not present

## 2022-09-21 DIAGNOSIS — Z7901 Long term (current) use of anticoagulants: Secondary | ICD-10-CM | POA: Diagnosis not present

## 2022-09-21 DIAGNOSIS — Z853 Personal history of malignant neoplasm of breast: Secondary | ICD-10-CM | POA: Diagnosis not present

## 2022-09-21 DIAGNOSIS — Z9049 Acquired absence of other specified parts of digestive tract: Secondary | ICD-10-CM | POA: Diagnosis not present

## 2022-09-21 DIAGNOSIS — J4530 Mild persistent asthma, uncomplicated: Secondary | ICD-10-CM | POA: Diagnosis not present

## 2022-09-21 DIAGNOSIS — G8929 Other chronic pain: Secondary | ICD-10-CM | POA: Diagnosis not present

## 2022-09-21 DIAGNOSIS — I10 Essential (primary) hypertension: Secondary | ICD-10-CM | POA: Diagnosis not present

## 2022-09-21 DIAGNOSIS — E039 Hypothyroidism, unspecified: Secondary | ICD-10-CM | POA: Diagnosis not present

## 2022-09-21 DIAGNOSIS — E1142 Type 2 diabetes mellitus with diabetic polyneuropathy: Secondary | ICD-10-CM | POA: Diagnosis not present

## 2022-09-21 DIAGNOSIS — Z87891 Personal history of nicotine dependence: Secondary | ICD-10-CM | POA: Diagnosis not present

## 2022-09-21 DIAGNOSIS — H547 Unspecified visual loss: Secondary | ICD-10-CM | POA: Diagnosis not present

## 2022-09-21 DIAGNOSIS — J302 Other seasonal allergic rhinitis: Secondary | ICD-10-CM | POA: Diagnosis not present

## 2022-09-21 DIAGNOSIS — G4733 Obstructive sleep apnea (adult) (pediatric): Secondary | ICD-10-CM | POA: Diagnosis not present

## 2022-09-21 DIAGNOSIS — Z556 Problems related to health literacy: Secondary | ICD-10-CM | POA: Diagnosis not present

## 2022-09-21 DIAGNOSIS — D649 Anemia, unspecified: Secondary | ICD-10-CM | POA: Diagnosis not present

## 2022-09-21 DIAGNOSIS — I872 Venous insufficiency (chronic) (peripheral): Secondary | ICD-10-CM | POA: Diagnosis not present

## 2022-09-21 DIAGNOSIS — M1990 Unspecified osteoarthritis, unspecified site: Secondary | ICD-10-CM | POA: Diagnosis not present

## 2022-09-21 DIAGNOSIS — M810 Age-related osteoporosis without current pathological fracture: Secondary | ICD-10-CM | POA: Diagnosis not present

## 2022-09-21 DIAGNOSIS — Z86711 Personal history of pulmonary embolism: Secondary | ICD-10-CM | POA: Diagnosis not present

## 2022-09-21 DIAGNOSIS — E785 Hyperlipidemia, unspecified: Secondary | ICD-10-CM | POA: Diagnosis not present

## 2022-09-21 DIAGNOSIS — Z9011 Acquired absence of right breast and nipple: Secondary | ICD-10-CM | POA: Diagnosis not present

## 2022-09-21 DIAGNOSIS — Z90710 Acquired absence of both cervix and uterus: Secondary | ICD-10-CM | POA: Diagnosis not present

## 2022-09-21 DIAGNOSIS — M5432 Sciatica, left side: Secondary | ICD-10-CM | POA: Diagnosis not present

## 2022-09-22 DIAGNOSIS — M5432 Sciatica, left side: Secondary | ICD-10-CM | POA: Diagnosis not present

## 2022-09-22 DIAGNOSIS — E1142 Type 2 diabetes mellitus with diabetic polyneuropathy: Secondary | ICD-10-CM | POA: Diagnosis not present

## 2022-09-22 DIAGNOSIS — E039 Hypothyroidism, unspecified: Secondary | ICD-10-CM | POA: Diagnosis not present

## 2022-09-22 DIAGNOSIS — I872 Venous insufficiency (chronic) (peripheral): Secondary | ICD-10-CM | POA: Diagnosis not present

## 2022-09-22 DIAGNOSIS — J4530 Mild persistent asthma, uncomplicated: Secondary | ICD-10-CM | POA: Diagnosis not present

## 2022-09-22 DIAGNOSIS — I10 Essential (primary) hypertension: Secondary | ICD-10-CM | POA: Diagnosis not present

## 2022-09-26 DIAGNOSIS — I872 Venous insufficiency (chronic) (peripheral): Secondary | ICD-10-CM | POA: Diagnosis not present

## 2022-09-26 DIAGNOSIS — E1142 Type 2 diabetes mellitus with diabetic polyneuropathy: Secondary | ICD-10-CM | POA: Diagnosis not present

## 2022-09-26 DIAGNOSIS — E039 Hypothyroidism, unspecified: Secondary | ICD-10-CM | POA: Diagnosis not present

## 2022-09-26 DIAGNOSIS — J4530 Mild persistent asthma, uncomplicated: Secondary | ICD-10-CM | POA: Diagnosis not present

## 2022-09-26 DIAGNOSIS — I10 Essential (primary) hypertension: Secondary | ICD-10-CM | POA: Diagnosis not present

## 2022-09-26 DIAGNOSIS — M5432 Sciatica, left side: Secondary | ICD-10-CM | POA: Diagnosis not present

## 2022-09-27 ENCOUNTER — Other Ambulatory Visit: Payer: Self-pay | Admitting: Surgery

## 2022-09-27 DIAGNOSIS — Z1231 Encounter for screening mammogram for malignant neoplasm of breast: Secondary | ICD-10-CM

## 2022-09-28 ENCOUNTER — Telehealth: Payer: Self-pay | Admitting: Family Medicine

## 2022-09-28 DIAGNOSIS — J4530 Mild persistent asthma, uncomplicated: Secondary | ICD-10-CM | POA: Diagnosis not present

## 2022-09-28 DIAGNOSIS — M5432 Sciatica, left side: Secondary | ICD-10-CM | POA: Diagnosis not present

## 2022-09-28 DIAGNOSIS — I10 Essential (primary) hypertension: Secondary | ICD-10-CM | POA: Diagnosis not present

## 2022-09-28 DIAGNOSIS — E039 Hypothyroidism, unspecified: Secondary | ICD-10-CM | POA: Diagnosis not present

## 2022-09-28 DIAGNOSIS — E1142 Type 2 diabetes mellitus with diabetic polyneuropathy: Secondary | ICD-10-CM | POA: Diagnosis not present

## 2022-09-28 DIAGNOSIS — I872 Venous insufficiency (chronic) (peripheral): Secondary | ICD-10-CM | POA: Diagnosis not present

## 2022-09-28 NOTE — Telephone Encounter (Signed)
Referred to them for wraps, needs clarification: says they have lower 2 layer or 3 layer compression wrap that is changed weekly then transition to velcro compression wraps. Seeking clarification.

## 2022-09-29 ENCOUNTER — Other Ambulatory Visit: Payer: Medicare Other

## 2022-09-29 ENCOUNTER — Inpatient Hospital Stay: Payer: Medicare Other | Attending: Adult Health

## 2022-09-29 ENCOUNTER — Encounter: Payer: Self-pay | Admitting: Adult Health

## 2022-09-29 ENCOUNTER — Telehealth: Payer: Self-pay | Admitting: Family Medicine

## 2022-09-29 ENCOUNTER — Inpatient Hospital Stay (HOSPITAL_BASED_OUTPATIENT_CLINIC_OR_DEPARTMENT_OTHER): Payer: Medicare Other | Admitting: Adult Health

## 2022-09-29 ENCOUNTER — Ambulatory Visit: Payer: Medicare Other | Admitting: Adult Health

## 2022-09-29 ENCOUNTER — Ambulatory Visit: Payer: Medicare Other | Admitting: Oncology

## 2022-09-29 VITALS — BP 127/54 | HR 56 | Temp 97.5°F | Resp 16 | Ht 67.5 in | Wt 219.5 lb

## 2022-09-29 DIAGNOSIS — Z9884 Bariatric surgery status: Secondary | ICD-10-CM | POA: Diagnosis not present

## 2022-09-29 DIAGNOSIS — D539 Nutritional anemia, unspecified: Secondary | ICD-10-CM

## 2022-09-29 DIAGNOSIS — Z86711 Personal history of pulmonary embolism: Secondary | ICD-10-CM | POA: Diagnosis not present

## 2022-09-29 DIAGNOSIS — Z86718 Personal history of other venous thrombosis and embolism: Secondary | ICD-10-CM | POA: Insufficient documentation

## 2022-09-29 DIAGNOSIS — D508 Other iron deficiency anemias: Secondary | ICD-10-CM | POA: Insufficient documentation

## 2022-09-29 DIAGNOSIS — Z853 Personal history of malignant neoplasm of breast: Secondary | ICD-10-CM | POA: Diagnosis not present

## 2022-09-29 DIAGNOSIS — D649 Anemia, unspecified: Secondary | ICD-10-CM

## 2022-09-29 LAB — CBC WITH DIFFERENTIAL (CANCER CENTER ONLY)
Abs Immature Granulocytes: 0.01 10*3/uL (ref 0.00–0.07)
Basophils Absolute: 0.1 10*3/uL (ref 0.0–0.1)
Basophils Relative: 1 %
Eosinophils Absolute: 0.1 10*3/uL (ref 0.0–0.5)
Eosinophils Relative: 2 %
HCT: 39.6 % (ref 36.0–46.0)
Hemoglobin: 13.1 g/dL (ref 12.0–15.0)
Immature Granulocytes: 0 %
Lymphocytes Relative: 26 %
Lymphs Abs: 1 10*3/uL (ref 0.7–4.0)
MCH: 28.6 pg (ref 26.0–34.0)
MCHC: 33.1 g/dL (ref 30.0–36.0)
MCV: 86.5 fL (ref 80.0–100.0)
Monocytes Absolute: 0.2 10*3/uL (ref 0.1–1.0)
Monocytes Relative: 5 %
Neutro Abs: 2.6 10*3/uL (ref 1.7–7.7)
Neutrophils Relative %: 66 %
Platelet Count: 214 10*3/uL (ref 150–400)
RBC: 4.58 MIL/uL (ref 3.87–5.11)
RDW: 15 % (ref 11.5–15.5)
WBC Count: 4 10*3/uL (ref 4.0–10.5)
nRBC: 0 % (ref 0.0–0.2)

## 2022-09-29 LAB — IRON AND IRON BINDING CAPACITY (CC-WL,HP ONLY)
Iron: 101 ug/dL (ref 28–170)
Saturation Ratios: 21 % (ref 10.4–31.8)
TIBC: 472 ug/dL — ABNORMAL HIGH (ref 250–450)
UIBC: 371 ug/dL

## 2022-09-29 LAB — FERRITIN: Ferritin: 425 ng/mL — ABNORMAL HIGH (ref 11–307)

## 2022-09-29 NOTE — Progress Notes (Signed)
[facility] up:    Kelli Martinez, [place] [address]   DIAGNOSIS: Concern for iron deficiency  SUMMARY OF HEMATOLOGIC HISTORY: Gastric bypass surgery in 2011 had history of iron deficiency from that time where she tells me that she received IV iron. Patient was referred to Dr. Alen Blew in September 2023 to evaluate for iron deficiency.  In July 2023 her hemoglobin was 11.7, MCV 87.9, iron 57, saturation 11.7%, ferritin 674.3.  The IBC 485.8.  He was started on oral iron by Dr. Lorenso Courier, however when seen by Dr. Alen Blew in September 2023 he recommended that she stop this. She underwent an upper endoscopy on March 03, 2022 that demonstrated reflux and gastric irritation and she was started on omeprazole 80 mg p.o. twice daily.  This was subsequently decreased and she is now on 40 mg a day. Kelli Martinez has multiple comorbidities including diabetes, intermittent blood sugar fluctuations due to dumping syndrome from her gastric bypass surgery in 2011.  CURRENT THERAPY: Observation  INTERVAL HISTORY: Kelli Martinez 77 y.o. female returns for follow-up and evaluation of her history of anemia.  Her hemoglobin is 13.1 today.  She tells me that despite her hemoglobin being normal she is feeling poorly.  She says she is fatigued and she was not sure that she was going to be able to make it to this appointment today.  She denies any chest pain, palpitations, lightheadedness, or any other concerns today.  She is currently taking Lasix 80 mg in the morning and 40 mg in the evening for fluid overload.  She is also noted by her endocrinologist to have intermittent fatigue and lightheadedness due to blood sugar fluctuations related to her diabetes and dumping syndrome.   Patient Active Problem List   Diagnosis Date Noted   Hypoglycemia following gastrointestinal surgery 03/19/2018   Edema of both lower extremities due to peripheral venous  insufficiency 11/20/2017   Maxillary sinusitis, acute 08/31/2017   Encounter for therapeutic drug monitoring 08/26/2013   Insomnia 05/01/2013   Long term (current) use of anticoagulants 04/15/2013   Postsurgical dumping syndrome 04/06/2013   Bilateral leg edema 04/06/2013   Reactive hypoglycemia 04/02/2013   Abdominal pain intermittent-evalulating for internal hernia 11/07/2012   S/P laparoscopic cholecystectomy 05/31/2012   Roux Y Gastric Bypass April 2011; repair [facility] 11/22/2011   Deficiency anemia 11/14/2011   Left leg paresthesias 10/27/2010   Hypothyroid 10/27/2010   Low back pain 10/27/2010   Obstructive sleep apnea 09/29/2007   PULMONARY EMBOLISM 09/25/2007   Seasonal and perennial allergic rhinitis 09/25/2007   Allergic asthma, mild persistent, uncomplicated [id number]   BREAST CANCER, HX OF 09/25/2007    is allergic to adhesive [tape].  MEDICAL HISTORY: Past Medical History:  Diagnosis Date   Allergy    Anemia    Arthritis    Asthma    Back pain, chronic    GETS INJECTIONS IN BACK   Blind right eye    hemorrhage   Blood transfusion    Cancer ([place])    breast 1994   Clotting disorder ([place])    Diabetes mellitus without complication ([place])    takes precose   Elevated liver enzymes    Gallstones    GERD (gastroesophageal reflux disease)    HX: breast cancer    Hyperlipidemia    Hypertension    Hypothyroid    Lymphedema of arm    RT   Morbid obesity ([facility])    OSA (obstructive sleep  apnea)    has not used in 2 years-lost weight   Osteoporosis    Peripheral neuropathy    on gabapentin   PONV (postoperative nausea and vomiting)    Psoriasis    Pulmonary embolism ([place]) 1998 / 1994 /1968   Syncope and collapse 2011   due to anemia    SURGICAL HISTORY: Past Surgical History:  Procedure Laterality Date   ABDOMINAL HYSTERECTOMY     BONE MARROW TRANSPLANT  1994   CARDIOVASCULAR STRESS TEST  05/23/2005   EF 53%   carpal tunnell     bil   cataracts      CHOLECYSTECTOMY  05/10/2012   Procedure: LAPAROSCOPIC CHOLECYSTECTOMY WITH INTRAOPERATIVE CHOLANGIOGRAM;  Surgeon: Pedro Earls, MD;  Location: [facility];  Service: General;  Laterality: N/A;  Laparoscopic Cholecystectomy with Intraoperative Cholangiogram   GASTRIC BYPASS  2011   bariatric surgery   HIATAL HERNIA REPAIR     IVC filter     recurrent DVT   KNEE ARTHROTOMY  1998   MASS EXCISION Right 11/24/2016   Procedure: EXCISION RIGHT AXILLARY LESION AND CHEST WALL LESION;  Surgeon: Johnathan Hausen, MD;  Location: [facility];  Service: General;  Laterality: Right;   MASTECTOMY     Right   US ECHOCARDIOGRAPHY  10/27/08   EF 55-60%    SOCIAL HISTORY: Social History   Socioeconomic History   Marital status: Widowed    Spouse name: Not on file   Number of children: 2   Years of education: Not on file   Highest education level: Not on file  Occupational History   Occupation: retired  Tobacco Use   Smoking status: Former    Packs/day: 0.50    Years: 10.00    Total pack years: 5.00    Types: Cigarettes    Quit date: 07/31/1978    Years since quitting: 44.1   Smokeless tobacco: Never  Vaping Use   Vaping Use: Never used  Substance and Sexual Activity   Alcohol use: No   Drug use: No   Sexual activity: Not Currently  Other Topics Concern   Not on file  Social History Narrative   Widowed with 2 children,  Lives in a one story home and her son and his wife recently moved in with her.  Retired Freight forwarder for [employer].  Education: some college.    Social Determinants of Health   Financial Resource Strain: Low Risk  (10/11/2021)   Overall Financial Resource Strain (CARDIA)    Difficulty of Paying Living Expenses: Not hard at all  Food Insecurity: No Food Insecurity (10/11/2021)   Hunger Vital Sign    Worried About Running Out of Food in the Last Year: Never true    Ran Out of Food in the Last Year: Never true  Transportation Needs: No Transportation Needs (10/11/2021)    PRAPARE - Hydrologist (Medical): No    Lack of Transportation (Non-Medical): No  Physical Activity: Inactive (10/11/2021)   Exercise Vital Sign    Days of Exercise per Week: 0 days    Minutes of Exercise per Session: 0 min  Stress: No Stress Concern Present (10/11/2021)   [place]    Feeling of Stress : Not at all  Social Connections: Moderately Isolated (10/05/2020)   Social Connection and Isolation Panel [NHANES]    Frequency of Communication with Friends and Family: More than three times a week    Frequency  of Social Gatherings with Friends and Family: Twice a week    Attends Religious Services: More than 4 times per year    Active Member of [employer] or Organizations: No    Attends Archivist Meetings: Never    Marital Status: Widowed  Intimate Partner Violence: Not At Risk (10/05/2020)   Humiliation, Afraid, Rape, and Kick questionnaire    Fear of Current or Ex-Partner: No    Emotionally Abused: No    Physically Abused: No    Sexually Abused: No    FAMILY HISTORY: Family History  Problem Relation Age of Onset   Sudden death Mother        car accident   Cancer Father 45       lung   Cancer Sister 85       lung cancer   Asthma Daughter    Colon cancer Neg Hx    Stomach cancer Neg Hx    Esophageal cancer Neg Hx    Colon polyps Neg Hx     Review of Systems  Constitutional:  Positive for fatigue. Negative for appetite change, chills, fever and unexpected weight change.  HENT:   Negative for hearing loss, lump/mass, mouth sores and trouble swallowing.   Eyes:  Negative for eye problems and icterus.  Respiratory:  Negative for chest tightness, cough and shortness of breath.   Cardiovascular:  Negative for chest pain, leg swelling and palpitations.  Gastrointestinal:  Negative for abdominal distention, abdominal pain, constipation, diarrhea, nausea and vomiting.  Endocrine:  Negative for hot flashes.  Genitourinary:  Negative for difficulty urinating.   Musculoskeletal:  Negative for arthralgias.  Skin:  Negative for itching and rash.  Neurological:  Negative for dizziness, extremity weakness, headaches and numbness.  Hematological:  Negative for adenopathy. Does not bruise/bleed easily.  Psychiatric/Behavioral:  Negative for depression. The patient is not nervous/anxious.       PHYSICAL EXAMINATION  ECOG PERFORMANCE STATUS: 1 - Symptomatic but completely ambulatory  Vitals:   09/29/22 0916  BP: (!) 127/54  Pulse: (!) 56  Resp: 16  Temp: (!) 97.5 F (36.4 C)  SpO2: 100%    Physical Exam Constitutional:      General: She is not in acute distress.    Appearance: Normal appearance. She is not toxic-appearing.  HENT:     Head: Normocephalic and atraumatic.  Eyes:     General: No scleral icterus. Cardiovascular:     Rate and Rhythm: Normal rate and regular rhythm.     Pulses: Normal pulses.     Heart sounds: Normal heart sounds.  Pulmonary:     Effort: Pulmonary effort is normal.     Breath sounds: Normal breath sounds.  Abdominal:     General: Abdomen is flat. Bowel sounds are normal. There is no distension.     Palpations: Abdomen is soft.     Tenderness: There is no abdominal tenderness.  Musculoskeletal:        General: No swelling.     Cervical back: Neck supple.  Lymphadenopathy:     Cervical: No cervical adenopathy.  Skin:    General: Skin is warm and dry.     Findings: No rash.  Neurological:     General: No focal deficit present.     Mental Status: She is alert.  Psychiatric:        Mood and Affect: Mood normal.        Behavior: Behavior normal.     LABORATORY DATA:  CBC  Component Value Date/Time   WBC 4.0 09/29/2022 0849   WBC 4.0 05/29/2022 1130   RBC 4.58 09/29/2022 0849   HGB 13.1 09/29/2022 0849   HGB 12.1 01/19/2016 0906   HCT 39.6 09/29/2022 0849   HCT 37.7 01/19/2016 0906   PLT 214 09/29/2022 0849    PLT 184 01/19/2016 0906   MCV 86.5 09/29/2022 0849   MCV 89.0 01/19/2016 0906   MCH 28.6 09/29/2022 0849   MCHC 33.1 09/29/2022 0849   RDW 15.0 09/29/2022 0849   RDW 14.0 01/19/2016 0906   LYMPHSABS 1.0 09/29/2022 0849   LYMPHSABS 1.2 01/19/2016 0906   MONOABS 0.2 09/29/2022 0849   MONOABS 0.2 01/19/2016 0906   EOSABS 0.1 09/29/2022 0849   EOSABS 0.1 01/19/2016 0906   BASOSABS 0.1 09/29/2022 0849   BASOSABS 0.1 01/19/2016 0906    CMP     Component Value Date/Time   NA 136 09/12/2022 0945   NA 135 12/14/2021 1045   NA 141 01/19/2016 0906   K 4.2 09/12/2022 0945   K 4.2 01/19/2016 0906   CL 98 09/12/2022 0945   CL 102 10/29/2012 1014   CO2 29 09/12/2022 0945   CO2 25 01/19/2016 0906   GLUCOSE 122 (H) 09/12/2022 0945   GLUCOSE 104 01/19/2016 0906   GLUCOSE 95 10/29/2012 1014   BUN 29 (H) 09/12/2022 0945   BUN 31 (H) 12/14/2021 1045   BUN 15.4 01/19/2016 0906   CREATININE 1.15 09/12/2022 0945   CREATININE 0.9 01/19/2016 0906   CALCIUM 9.9 09/12/2022 0945   CALCIUM 9.5 01/19/2016 0906   PROT 7.6 09/12/2022 0945   PROT 7.4 12/14/2021 1045   PROT 7.0 01/19/2016 0906   ALBUMIN 4.3 09/12/2022 0945   ALBUMIN 4.5 12/14/2021 1045   ALBUMIN 3.5 01/19/2016 0906   AST 24 09/12/2022 0945   AST 22 01/19/2016 0906   ALT 17 09/12/2022 0945   ALT 25 01/19/2016 0906   ALKPHOS 96 09/12/2022 0945   ALKPHOS 78 01/19/2016 0906   BILITOT 0.3 09/12/2022 0945   BILITOT 0.3 12/14/2021 1045   BILITOT 0.33 01/19/2016 0906   GFRNONAA 58 (L) 05/29/2022 1301   GFRAA >60 11/29/2019 1940       ASSESSMENT and THERAPY PLAN:   Deficiency anemia Kelli Martinez is a 77 year old woman with history of iron deficiency anemia back in 2011 when she underwent gastric bypass surgery here today for labs and follow-up after reestablishing with Dr. Alen Blew in September 2023.  Her hemoglobin is 13.1 today.  There are no signs of acute blood loss that would be responsible for her level of fatigue.  I let her  know that it could certainly be one of her other medical issues such as the Lasix affecting the blood pressure for the blood sugar changes caused by her history of dumping syndrome.  Just if the feelings persist that she reach out to her primary care provider for further guidance since she is the "quarterback" of the patient's care.  Her ferritin and iron studies are pending today we will follow-up with her once we have these results.  Since she has had a gastric bypass surgery if her ferritin is decreased I would recommend IV iron over oral iron due to absorption challenges that can happen after bypass.  Have Benjamine Mola my card and Dr. Geralyn Flash card.  We will see her back in 6 months for labs and follow-up.  All questions were answered. The patient knows to call the clinic with any problems, questions or concerns. We  can certainly see the patient much sooner if necessary.  Total encounter time:40 minutes*in face-to-face visit time, chart review, lab review, care coordination, order entry, and documentation of the encounter time.  Wilber Bihari, NP 09/29/22 1:38 PM Medical Oncology and Hematology [facility] [place], [place] [address] Tel. [phone]    Fax. [phone]  *Total Encounter Time as defined by the Centers for Medicare and Medicaid Services includes, in addition to the face-to-face time of a patient visit (documented in the note above) non-face-to-face time: obtaining and reviewing outside history, ordering and reviewing medications, tests or procedures, care coordination (communications with other health care professionals or caregivers) and documentation in the medical record.

## 2022-09-29 NOTE — Telephone Encounter (Signed)
[place] with [facility] is calling and would like to move pt speech therapy evaluate to next week

## 2022-09-29 NOTE — Assessment & Plan Note (Addendum)
Kelli Martinez is a 77 year old woman with history of iron deficiency anemia back in 2011 when she underwent gastric bypass surgery here today for labs and follow-up after reestablishing with Dr. Alen Blew in September 2023.  Her hemoglobin is 13.1 today.  There are no signs of acute blood loss that would be responsible for her level of fatigue.  I let her know that it could certainly be one of her other medical issues such as the Lasix affecting the blood pressure for the blood sugar changes caused by her history of dumping syndrome.  Just if the feelings persist that she reach out to her primary care provider for further guidance since she is the "quarterback" of the patient's care.  Her ferritin and iron studies are pending today we will follow-up with her once we have these results.  Since she has had a gastric bypass surgery if her ferritin is decreased I would recommend IV iron over oral iron due to absorption challenges that can happen after bypass.  Have Benjamine Mola my card and Dr. Geralyn Flash card.  We will see her back in 6 months for labs and follow-up.

## 2022-10-02 ENCOUNTER — Telehealth: Payer: Self-pay | Admitting: Adult Health

## 2022-10-02 NOTE — Telephone Encounter (Signed)
Scheduled appointment per 3/1 los. Patient is aware of the made appointment.

## 2022-10-03 ENCOUNTER — Telehealth: Payer: Self-pay | Admitting: Family Medicine

## 2022-10-03 DIAGNOSIS — J4530 Mild persistent asthma, uncomplicated: Secondary | ICD-10-CM | POA: Diagnosis not present

## 2022-10-03 DIAGNOSIS — E1142 Type 2 diabetes mellitus with diabetic polyneuropathy: Secondary | ICD-10-CM | POA: Diagnosis not present

## 2022-10-03 DIAGNOSIS — M5432 Sciatica, left side: Secondary | ICD-10-CM | POA: Diagnosis not present

## 2022-10-03 DIAGNOSIS — I872 Venous insufficiency (chronic) (peripheral): Secondary | ICD-10-CM | POA: Diagnosis not present

## 2022-10-03 DIAGNOSIS — I10 Essential (primary) hypertension: Secondary | ICD-10-CM | POA: Diagnosis not present

## 2022-10-03 DIAGNOSIS — E039 Hypothyroidism, unspecified: Secondary | ICD-10-CM | POA: Diagnosis not present

## 2022-10-03 NOTE — Telephone Encounter (Signed)
Contacted Kelli Martinez to schedule their annual wellness visit. Appointment made for 10/13/22.  Kelli Martinez AWV direct phone # [phone]

## 2022-10-03 NOTE — Telephone Encounter (Signed)
[place] with [facility] /  PTA [phone]  Calling to Report:  Pt had a fall Sunday in the bathroom, no injuries  Weak since Thursday and saying she is not feeling well  Vitals stable  Urine clear  Blood work from cancer center unremarkable  No new medications  Alert and oriented x 4   Pt needs walker now, as opposed to cane  Limited to 25 feet, compared to 500 ft, as of Thursday

## 2022-10-04 DIAGNOSIS — I10 Essential (primary) hypertension: Secondary | ICD-10-CM | POA: Diagnosis not present

## 2022-10-04 DIAGNOSIS — J4530 Mild persistent asthma, uncomplicated: Secondary | ICD-10-CM | POA: Diagnosis not present

## 2022-10-04 DIAGNOSIS — E1142 Type 2 diabetes mellitus with diabetic polyneuropathy: Secondary | ICD-10-CM | POA: Diagnosis not present

## 2022-10-04 DIAGNOSIS — E039 Hypothyroidism, unspecified: Secondary | ICD-10-CM | POA: Diagnosis not present

## 2022-10-04 DIAGNOSIS — I872 Venous insufficiency (chronic) (peripheral): Secondary | ICD-10-CM | POA: Diagnosis not present

## 2022-10-04 DIAGNOSIS — M5432 Sciatica, left side: Secondary | ICD-10-CM | POA: Diagnosis not present

## 2022-10-05 ENCOUNTER — Ambulatory Visit (HOSPITAL_BASED_OUTPATIENT_CLINIC_OR_DEPARTMENT_OTHER)
Admission: RE | Admit: 2022-10-05 | Discharge: 2022-10-05 | Disposition: A | Discharge status: Home / Self Care | Payer: Medicare Other | Source: Ambulatory Visit | Attending: Family Medicine | Admitting: Family Medicine

## 2022-10-05 ENCOUNTER — Ambulatory Visit (INDEPENDENT_AMBULATORY_CARE_PROVIDER_SITE_OTHER): Payer: Medicare Other | Admitting: Family Medicine

## 2022-10-05 VITALS — BP 122/70 | HR 64 | Temp 97.8°F | Ht 67.5 in | Wt 219.0 lb

## 2022-10-05 DIAGNOSIS — R0609 Other forms of dyspnea: Secondary | ICD-10-CM

## 2022-10-05 DIAGNOSIS — W19XXXA Unspecified fall, initial encounter: Secondary | ICD-10-CM | POA: Diagnosis not present

## 2022-10-05 DIAGNOSIS — R600 Localized edema: Secondary | ICD-10-CM | POA: Diagnosis not present

## 2022-10-05 DIAGNOSIS — R531 Weakness: Secondary | ICD-10-CM

## 2022-10-05 DIAGNOSIS — R5383 Other fatigue: Secondary | ICD-10-CM | POA: Insufficient documentation

## 2022-10-05 DIAGNOSIS — R0989 Other specified symptoms and signs involving the circulatory and respiratory systems: Secondary | ICD-10-CM | POA: Diagnosis not present

## 2022-10-05 DIAGNOSIS — I872 Venous insufficiency (chronic) (peripheral): Secondary | ICD-10-CM

## 2022-10-05 DIAGNOSIS — R0600 Dyspnea, unspecified: Secondary | ICD-10-CM | POA: Diagnosis not present

## 2022-10-05 DIAGNOSIS — I890 Lymphedema, not elsewhere classified: Secondary | ICD-10-CM

## 2022-10-05 DIAGNOSIS — R413 Other amnesia: Secondary | ICD-10-CM | POA: Diagnosis not present

## 2022-10-05 LAB — POC URINALSYSI DIPSTICK (AUTOMATED)
Bilirubin, UA: NEGATIVE
Blood, UA: NEGATIVE
Glucose, UA: NEGATIVE
Ketones, UA: NEGATIVE
Leukocytes, UA: NEGATIVE
Nitrite, UA: NEGATIVE
Protein, UA: NEGATIVE
Spec Grav, UA: 1.01 (ref 1.010–1.025)
Urobilinogen, UA: 0.2 E.U./dL
pH, UA: 6 (ref 5.0–8.0)

## 2022-10-05 MED ORDER — TRUFORM ARM SLEEVE L 15-20MMHG MISC: 1.0000 | 1 refills | Status: DC

## 2022-10-05 NOTE — Telephone Encounter (Signed)
Left a message for the patient to return my cal.

## 2022-10-05 NOTE — Telephone Encounter (Signed)
3 layer is fine if pt can tolerate it.

## 2022-10-05 NOTE — Telephone Encounter (Signed)
Left a detailed message on Kelli Martinez's voicemail with the verbal approval as below.

## 2022-10-05 NOTE — Progress Notes (Signed)
Established Patient Office Visit   Subjective  Patient ID: Kelli Martinez, female    DOB: 16-Nov-1945  Age: 77 y.o. MRN: [id number]  Chief Complaint  Patient presents with   Fatigue    Patient complains of fatigue, x1 week    Headache   Dizziness    Patient complains of dizziness    Pt accompanied by her daughter.  Pt is a 77 yo female with  has a past medical history of Allergy, Anemia, Arthritis, Asthma, Back pain, chronic, Blind right eye,Cancer, Diabetes mellitus without complication,  GERD, breast cancer, Hyperlipidemia, Hypertension, Hypothyroid, Lymphedema of arm, Morbid obesity, OSA , Osteoporosis, Peripheral neuropathy,  Psoriasis, Pulmonary embolism who was seen for f/u and acute concern.  Pt notes DOE, fatigue, sleepiness, dizziness, confusion, and falls last wk.  Laying in bed made pt feel better.  Pt denies dysuria, frequency, urgency.  Pt was noticing improvement in  LE edema on Lasix 80 mg in am and 40 in pm.   Has not had muscle cramps since taking klor con 20 mEq TID and Magnesium 750 mg daily.  HH to start helping with wrapping legs. Pt inquires if form was completed for her to get a rollator.  Also requesting a shower chair.  Endorses some difficulty getting in and out of the shower as her current chair does not fit properly in the shower.  Pt has to have a new sleep study done for h/o OSA.  Awaiting device to have home sleep study, but they are on back order.  Pt states she should be able to get a new compression sleeve for RUE lymphedema as medicare will now cover them.       ROS Negative unless stated above    Objective:     BP 122/70 (BP Location: Left Arm, Patient Position: Sitting, Cuff Size: Large)   Pulse 64   Temp 97.8 F (36.6 C) (Oral)   Ht 5' 7.5" (1.715 m)   Wt 219 lb (99.3 kg)   SpO2 99%   BMI 33.79 kg/m    Physical Exam Constitutional:      Appearance: Normal appearance.  HENT:     Head: Normocephalic and atraumatic.      Nose: Nose normal.     Mouth/Throat:     Mouth: Mucous membranes are moist.  Eyes:     Extraocular Movements: Extraocular movements intact.     Conjunctiva/sclera: Conjunctivae normal.     Pupils: Pupils are equal, round, and reactive to light.  Cardiovascular:     Rate and Rhythm: Normal rate and regular rhythm.     Heart sounds: Normal heart sounds.  Pulmonary:     Effort: Pulmonary effort is normal.     Breath sounds: Examination of the left-lower field reveals decreased breath sounds. Decreased breath sounds present. No wheezing, rhonchi or rales.  Abdominal:     Palpations: Abdomen is soft.  Musculoskeletal:     Right lower leg: Pitting Edema present.     Left lower leg: 1+ Pitting Edema present.     Comments: Trace edema on  RLE.  Venous stasis changes on LLE with blister and ruddy red color.    Skin:    General: Skin is warm and dry.     Comments: LLE >RLE.  Chrnoic venous stasis changes LLE>RLE with ruddy erythema, bumpy appearance of skin on L ankle and shin. Pedal edema.  Neurological:     Mental Status: She is alert and oriented to person, place, and time.  Mental status is at baseline.      Results for orders placed or performed in visit on 10/05/22  POCT Urinalysis Dipstick (Automated)  Result Value Ref Range   Color, UA Yellow    Clarity, UA Clear    Glucose, UA Negative Negative   Bilirubin, UA Negative    Ketones, UA Negative    Spec Grav, UA 1.010 1.010 - 1.025   Blood, UA Negative    pH, UA 6.0 5.0 - 8.0   Protein, UA Negative Negative   Urobilinogen, UA 0.2 0.2 or 1.0 E.U./dL   Nitrite, UA Negative    Leukocytes, UA Negative Negative      Assessment & Plan:  Fatigue, unspecified type -discussed possible causes including infection from UTI, PNA, etc. -will obtain CXR to evaluate -UA negative -obtain labs -     DG Chest 2 View -     Shower chair -     Comprehensive metabolic panel -     CBC with Differential/Platelet -      Magnesium  Weakness -UA negative -     POCT Urinalysis Dipstick (Automated)  Fall, initial encounter -b/l LE edema likely contributing to increased falls -fall prevention -     Shower chair  Chronic venous stasis dermatitis of both lower extremities -stable -L>R -HH to start wrapping LEs. -     Shower chair  Bilateral lower extremity edema -2/2 venous stasis  -     DG Chest 2 View -     Shower chair -     Brain natriuretic peptide  Memory change -possibly 2/2 infection, electrolyte abnormality -obtain labs, UA, CXR -     Comprehensive metabolic panel -     CBC with Differential/Platelet  DOE (dyspnea on exertion) -     Shower chair -     Brain natriuretic peptide  Lymphedema of Right arm       - compression sleeve   Return if symptoms worsen or fail to improve.   Billie Ruddy, MD

## 2022-10-05 NOTE — Telephone Encounter (Signed)
Ok

## 2022-10-06 ENCOUNTER — Telehealth: Payer: Self-pay | Admitting: Family Medicine

## 2022-10-06 NOTE — Telephone Encounter (Signed)
Patient informed of the message and verbalized understanding.  

## 2022-10-06 NOTE — Telephone Encounter (Signed)
Pt called to request a call back to go over her xray results. 

## 2022-10-06 NOTE — Telephone Encounter (Signed)
Patient seen in clinic this week.

## 2022-10-06 NOTE — Telephone Encounter (Signed)
Spoke to pt. Pt reports she already spoke to a nurse who had answered all of her questions.

## 2022-10-10 DIAGNOSIS — E1142 Type 2 diabetes mellitus with diabetic polyneuropathy: Secondary | ICD-10-CM | POA: Diagnosis not present

## 2022-10-10 DIAGNOSIS — I872 Venous insufficiency (chronic) (peripheral): Secondary | ICD-10-CM | POA: Diagnosis not present

## 2022-10-10 DIAGNOSIS — E039 Hypothyroidism, unspecified: Secondary | ICD-10-CM | POA: Diagnosis not present

## 2022-10-10 DIAGNOSIS — I10 Essential (primary) hypertension: Secondary | ICD-10-CM | POA: Diagnosis not present

## 2022-10-10 DIAGNOSIS — M5432 Sciatica, left side: Secondary | ICD-10-CM | POA: Diagnosis not present

## 2022-10-10 DIAGNOSIS — J4530 Mild persistent asthma, uncomplicated: Secondary | ICD-10-CM | POA: Diagnosis not present

## 2022-10-11 DIAGNOSIS — E039 Hypothyroidism, unspecified: Secondary | ICD-10-CM | POA: Diagnosis not present

## 2022-10-11 DIAGNOSIS — J4530 Mild persistent asthma, uncomplicated: Secondary | ICD-10-CM | POA: Diagnosis not present

## 2022-10-11 DIAGNOSIS — I10 Essential (primary) hypertension: Secondary | ICD-10-CM | POA: Diagnosis not present

## 2022-10-11 DIAGNOSIS — M5432 Sciatica, left side: Secondary | ICD-10-CM | POA: Diagnosis not present

## 2022-10-11 DIAGNOSIS — I872 Venous insufficiency (chronic) (peripheral): Secondary | ICD-10-CM | POA: Diagnosis not present

## 2022-10-11 DIAGNOSIS — E1142 Type 2 diabetes mellitus with diabetic polyneuropathy: Secondary | ICD-10-CM | POA: Diagnosis not present

## 2022-10-12 DIAGNOSIS — M5416 Radiculopathy, lumbar region: Secondary | ICD-10-CM | POA: Diagnosis not present

## 2022-10-12 DIAGNOSIS — M48061 Spinal stenosis, lumbar region without neurogenic claudication: Secondary | ICD-10-CM | POA: Diagnosis not present

## 2022-10-12 DIAGNOSIS — Z5181 Encounter for therapeutic drug level monitoring: Secondary | ICD-10-CM | POA: Diagnosis not present

## 2022-10-12 DIAGNOSIS — M5136 Other intervertebral disc degeneration, lumbar region: Secondary | ICD-10-CM | POA: Diagnosis not present

## 2022-10-12 DIAGNOSIS — Z79899 Other long term (current) drug therapy: Secondary | ICD-10-CM | POA: Diagnosis not present

## 2022-10-12 DIAGNOSIS — Z79891 Long term (current) use of opiate analgesic: Secondary | ICD-10-CM | POA: Diagnosis not present

## 2022-10-12 DIAGNOSIS — M5030 Other cervical disc degeneration, unspecified cervical region: Secondary | ICD-10-CM | POA: Diagnosis not present

## 2022-10-12 DIAGNOSIS — M5126 Other intervertebral disc displacement, lumbar region: Secondary | ICD-10-CM | POA: Diagnosis not present

## 2022-10-12 DIAGNOSIS — M4316 Spondylolisthesis, lumbar region: Secondary | ICD-10-CM | POA: Diagnosis not present

## 2022-10-13 ENCOUNTER — Ambulatory Visit: Payer: Medicare Other

## 2022-10-13 ENCOUNTER — Ambulatory Visit (INDEPENDENT_AMBULATORY_CARE_PROVIDER_SITE_OTHER): Payer: Medicare Other

## 2022-10-13 VITALS — Ht 67.5 in | Wt 217.0 lb

## 2022-10-13 DIAGNOSIS — Z0000 Encounter for general adult medical examination without abnormal findings: Secondary | ICD-10-CM

## 2022-10-13 NOTE — Progress Notes (Signed)
Subjective:   Kelli Martinez is a 77 y.o. female who presents for Medicare Annual (Subsequent) preventive examination.  Review of Systems    Virtual Visit via Telephone Note  I connected with  Kelli Martinez on 10/13/22 at  3:30 PM EDT by telephone and verified that I am speaking with the correct person using two identifiers.  Location: Patient: Home Provider: Office Persons participating in the virtual visit: patient/Nurse Health Advisor   I discussed the limitations, risks, security and privacy concerns of performing an evaluation and management service by telephone and the availability of in person appointments. The patient expressed understanding and agreed to proceed.  Interactive audio and video telecommunications were attempted between this nurse and patient, however failed, due to patient having technical difficulties OR patient did not have access to video capability.  We continued and completed visit with audio only.  Some vital signs may be absent or patient reported.   Criselda Peaches, LPN  Cardiac Risk Factors include: advanced age (>74men, >49 women)     Objective:    Today's Vitals   10/13/22 1601  Weight: 217 lb (98.4 kg)  Height: 5' 7.5" (1.715 m)   Body mass index is 33.49 kg/m.     10/13/2022    4:11 PM 05/29/2022   11:26 AM 03/31/2022   11:12 AM 10/11/2021    8:43 AM 09/27/2021    9:05 AM 10/05/2020    8:26 AM 12/25/2018   11:09 AM  Advanced Directives  Does Patient Have a Medical Advance Directive? No No No No No No No  Would patient like information on creating a medical advance directive? No - Patient declined No - Patient declined Yes (MAU/Ambulatory/Procedural Areas - Information given)  Yes (MAU/Ambulatory/Procedural Areas - Information given) Yes (MAU/Ambulatory/Procedural Areas - Information given)     Current Medications (verified) Outpatient Encounter Medications as of 10/13/2022  Medication Sig   acarbose (PRECOSE) 50  MG tablet 1 tablet at start of breakfast and lunch and at dinnertime   albuterol (PROAIR HFA) 108 (90 Base) MCG/ACT inhaler Inhale 2 puffs into the lungs every 6 (six) hours as needed for wheezing or shortness of breath.   baclofen (LIORESAL) 20 MG tablet TAKE 1 TABLET THREE TIMES A DAY (UPDATED PRESCRIPTION)   Blood Glucose Monitoring Suppl (FREESTYLE FREEDOM LITE) w/Device KIT Use as directed to check blood sugar once daily.   Calcium Carbonate-Vitamin D 600-400 MG-UNIT tablet Take 1 tablet by mouth daily.   clobetasol cream (TEMOVATE) [id number] % APPLY 1 APPLICATION TOPICALLY TWICE A DAY   Elastic Bandages & Supports (TRUFORM ARM SLEEVE L 15-20MMHG) MISC 1 Device by Does not apply route as directed.   fluticasone (FLONASE) 50 MCG/ACT nasal spray Place 2 sprays into both nostrils daily.   fluticasone-salmeterol (ADVAIR DISKUS) 100-50 MCG/ACT AEPB USE 1 INHALATION EVERY 12 HOURS, RINSE MOUTH AFTER USING   FREESTYLE LITE test strip USE AS INSTRUCTED TO CHECK BLOOD SUGAR ONCE DAILY   furosemide (LASIX) 80 MG tablet TAKE 1 TABLET TWICE A DAY   gabapentin (NEURONTIN) 600 MG tablet TAKE 1 TABLET THREE TIMES A DAY (DOSE CHANGE)   HYDROcodone-acetaminophen (NORCO) 5-325 MG tablet Take 1 tablet by mouth 2 (two) times daily as needed for moderate pain. (Patient taking differently: Take 0.5-1 tablets by mouth 3 (three) times daily as needed for moderate pain or severe pain.)   Lancets (FREESTYLE) lancets USE AS INSTRUCTED TO CHECK BLOOD SUGAR ONCE DAILY   levothyroxine (SYNTHROID) 100 MCG tablet TAKE 1 TABLET  DAILY BEFORE BREAKFAST (NEED WHEN 88 MCG OUT)   Magnesium Cl-Calcium Carbonate (SLOW-MAG PO) Take 500 mg by mouth at bedtime.    methylcellulose (ARTIFICIAL TEARS) 1 % ophthalmic solution Place 1 drop into both eyes as needed. Dry eyes   omeprazole (PRILOSEC) 40 MG capsule Take 1 capsule (40 mg total) by mouth daily.   potassium chloride SA (KLOR-CON M) 20 MEQ tablet Take 2 tablets (40 mEq total) by mouth 2  (two) times daily.   pravastatin (PRAVACHOL) 10 MG tablet TAKE 1 TABLET DAILY   spironolactone (ALDACTONE) 100 MG tablet TAKE 1 TABLET DAILY   temazepam (RESTORIL) 30 MG capsule 1-2 for sleep as needed   ursodiol (ACTIGALL) 300 MG capsule Take 2 capsules (600 mg total) by mouth 2 (two) times daily.   vitamin B-12 (CYANOCOBALAMIN) 500 MCG tablet Take 500 mcg by mouth every other day.   Vitamin D, Ergocalciferol, 50000 units CAPS Take 1 capsule by mouth once a week.   warfarin (COUMADIN) 5 MG tablet TAKE 2 TABLETS DAILY EXCEPT TAKE 1 TABLET ON WEDNESDAYS AND SATURDAYS OR TAKE AS DIRECTED BY ANTICOAGULATION CLINIC   No facility-administered encounter medications on file as of 10/13/2022.    Allergies (verified) Adhesive [tape]   History: Past Medical History:  Diagnosis Date   Allergy    Anemia    Arthritis    Asthma    Back pain, chronic    GETS INJECTIONS IN BACK   Blind right eye    hemorrhage   Blood transfusion    Cancer ([place])    breast 1994   Clotting disorder ([place])    Diabetes mellitus without complication ([place])    takes precose   Elevated liver enzymes    Gallstones    GERD (gastroesophageal reflux disease)    HX: breast cancer    Hyperlipidemia    Hypertension    Hypothyroid    Lymphedema of arm    RT   Morbid obesity ([facility])    OSA (obstructive sleep apnea)    has not used in 2 years-lost weight   Osteoporosis    Peripheral neuropathy    on gabapentin   PONV (postoperative nausea and vomiting)    Psoriasis    Pulmonary embolism ([place]) 1998 / 1994 /1968   Syncope and collapse 2011   due to anemia   Past Surgical History:  Procedure Laterality Date   ABDOMINAL HYSTERECTOMY     BONE MARROW TRANSPLANT  1994   CARDIOVASCULAR STRESS TEST  05/23/2005   EF 53%   carpal tunnell     bil   cataracts     CHOLECYSTECTOMY  05/10/2012   Procedure: LAPAROSCOPIC CHOLECYSTECTOMY WITH INTRAOPERATIVE CHOLANGIOGRAM;  Surgeon: Pedro Earls, MD;  Location: [facility];   Service: General;  Laterality: N/A;  Laparoscopic Cholecystectomy with Intraoperative Cholangiogram   GASTRIC BYPASS  2011   bariatric surgery   HIATAL HERNIA REPAIR     IVC filter     recurrent DVT   KNEE ARTHROTOMY  1998   MASS EXCISION Right 11/24/2016   Procedure: EXCISION RIGHT AXILLARY LESION AND CHEST WALL LESION;  Surgeon: Johnathan Hausen, MD;  Location: [facility];  Service: General;  Laterality: Right;   MASTECTOMY     Right   US ECHOCARDIOGRAPHY  10/27/08   EF 55-60%   Family History  Problem Relation Age of Onset   Sudden death Mother        car accident   Cancer Father 62  lung   Cancer Sister 74       lung cancer   Asthma Daughter    Colon cancer Neg Hx    Stomach cancer Neg Hx    Esophageal cancer Neg Hx    Colon polyps Neg Hx    Social History   Socioeconomic History   Marital status: Widowed    Spouse name: Not on file   Number of children: 2   Years of education: Not on file   Highest education level: Associate degree: occupational, Hotel manager, or vocational program  Occupational History   Occupation: retired  Tobacco Use   Smoking status: Former    Packs/day: 0.50    Years: 10.00    Additional pack years: 0.00    Total pack years: 5.00    Types: Cigarettes    Quit date: 07/31/1978    Years since quitting: 44.2   Smokeless tobacco: Never  Vaping Use   Vaping Use: Never used  Substance and Sexual Activity   Alcohol use: No   Drug use: No   Sexual activity: Not Currently  Other Topics Concern   Not on file  Social History Narrative   Widowed with 2 children,  Lives in a one story home and her son and his wife recently moved in with her.  Retired Freight forwarder for [employer].  Education: some college.    Social Determinants of Health   Financial Resource Strain: Low Risk  (10/13/2022)   Overall Financial Resource Strain (CARDIA)    Difficulty of Paying Living Expenses: Not hard at all  Food Insecurity: No Food Insecurity (10/13/2022)    Hunger Vital Sign    Worried About Running Out of Food in the Last Year: Never true    Ran Out of Food in the Last Year: Never true  Transportation Needs: No Transportation Needs (10/13/2022)   PRAPARE - Hydrologist (Medical): No    Lack of Transportation (Non-Medical): No  Physical Activity: Sufficiently Active (10/13/2022)   Exercise Vital Sign    Days of Exercise per Week: 7 days    Minutes of Exercise per Session: 30 min  Recent Concern: Physical Activity - Insufficiently Active (10/05/2022)   Exercise Vital Sign    Days of Exercise per Week: 5 days    Minutes of Exercise per Session: 20 min  Stress: No Stress Concern Present (10/13/2022)   [place]    Feeling of Stress : Not at all  Social Connections: Moderately Integrated (10/13/2022)   Social Connection and Isolation Panel [NHANES]    Frequency of Communication with Friends and Family: More than three times a week    Frequency of Social Gatherings with Friends and Family: More than three times a week    Attends Religious Services: More than 4 times per year    Active Member of [employer] or Organizations: Yes    Attends Archivist Meetings: More than 4 times per year    Marital Status: Widowed    Tobacco Counseling Counseling given: Not Answered   Clinical Intake:  Pre-visit preparation completed: Yes  Pain : No/denies pain     BMI - recorded: 33.49 Nutritional Status: BMI > 30  Obese Nutritional Risks: None Diabetes: Yes CBG done?: Yes (CBG 120 Taken by patient) CBG resulted in Enter/ Edit results?: Yes Did pt. bring in CBG monitor from home?: No  How often do you need to have someone help you when you read instructions,  pamphlets, or other written materials from your doctor or pharmacy?: 1 - Never  Diabetic?  Yes Nutrition Risk Assessment:  Has the patient had any N/V/D within the last 2 months?  No  Does the  patient have any non-healing wounds?  No  Has the patient had any unintentional weight loss or weight gain?  No   Diabetes:  Is the patient diabetic?  Yes  If diabetic, was a CBG obtained today?  Yes CBG 120 Taken  by patient Did the patient bring in their glucometer from home?  No  How often do you monitor your CBG's? 2 x Daily.   Financial Strains and Diabetes Management:  Are you having any financial strains with the device, your supplies or your medication? No .  Does the patient want to be seen by Chronic Care Management for management of their diabetes?  No  Would the patient like to be referred to a Nutritionist or for Diabetic Management?  No   Diabetic Exams:  Diabetic Eye Exam: Completed Yes. Overdue for diabetic eye exam. Pt has been advised about the importance in completing this exam. A referral has been placed today. Message sent to referral coordinator for scheduling purposes. Advised pt to expect a call from office referred to regarding appt.  Diabetic Foot Exam: Completed Yes. Pt has been advised about the importance in completing this exam. Pt is scheduled for diabetic foot exam on Followed by Dr Dwyane Dee.    Information entered by :: Rolene Arbour LPN   Activities of Daily Living    10/13/2022    4:08 PM  In your present state of health, do you have any difficulty performing the following activities:  Hearing? 0  Vision? 0  Difficulty concentrating or making decisions? 0  Walking or climbing stairs? 0  Dressing or bathing? 0  Doing errands, shopping? 0  Preparing Food and eating ? N  Using the Toilet? N  In the past six months, have you accidently leaked urine? Y  Comment Rx Meds  Do you have problems with loss of bowel control? N  Managing your Medications? N  Managing your Finances? N  Housekeeping or managing your Housekeeping? N    Patient Care Team: Billie Ruddy, MD as PCP - General (Family Medicine) Harl Bowie Royetta Crochet, MD as PCP - Cardiology  (Cardiology) Alda Berthold, DO as Consulting Physician (Neurology) Elayne Snare, MD as Consulting Physician (Endocrinology)  Indicate any recent Medical Services you may have received from other than [facility] providers in the past year (date may be approximate).     Assessment:   This is a routine wellness examination for [place].  Hearing/Vision screen Hearing Screening - Comments:: Denies hearing difficulties   Vision Screening - Comments:: Wears rx glasses - up to date with routine eye exams with  Dr Katy Fitch  Dietary issues and exercise activities discussed: Exercise limited by: None identified   Goals Addressed               This Visit's Progress     Increase physical activity (pt-stated)        I want to build up my muscle and legs.      Depression Screen    10/13/2022    4:07 PM 10/11/2021    8:48 AM 09/15/2021   10:36 AM 10/05/2020    8:21 AM 09/27/2020   10:46 AM 09/24/2019    8:10 PM  PHQ 2/9 Scores  PHQ - 2 Score 0 0 1 1 1  0  PHQ- 9 Score     5     Fall Risk    10/13/2022    4:09 PM 10/05/2022    2:41 AM 10/11/2021    8:47 AM 09/15/2021   10:36 AM 10/05/2020    8:32 AM  Fall Risk   Falls in the past year? 1 1 0 1 1  Number falls in past yr: 0 1   1  Injury with Fall? 0 0   1  Comment No medical attention required      Risk for fall due to : No Fall Risks  Impaired balance/gait;Impaired mobility;Medication side effect;Impaired vision  Impaired vision;Impaired balance/gait;Impaired mobility  Follow up Falls prevention discussed  Falls evaluation completed;Education provided;Falls prevention discussed  Falls prevention discussed    FALL RISK PREVENTION PERTAINING TO THE HOME:  Any stairs in or around the home? No  If so, are there any without handrails? No  Home free of loose throw rugs in walkways, pet beds, electrical cords, etc? Yes  Adequate lighting in your home to reduce risk of falls? Yes   ASSISTIVE DEVICES UTILIZED TO PREVENT FALLS:  Life alert? No   Use of a cane, walker or w/c? Yes  Grab bars in the bathroom? No  Shower chair or bench in shower? No  Elevated toilet seat or a handicapped toilet? Yes   TIMED UP AND GO:  Was the test performed? No . Audio Visit   Cognitive Function:        10/13/2022    4:11 PM 10/11/2021    8:50 AM 10/05/2020    8:36 AM  6CIT Screen  What Year? 0 points 0 points 0 points  What month? 0 points 0 points 0 points  What time? 0 points 0 points   Count back from 20 2 points 0 points 0 points  Months in reverse 0 points 0 points 0 points  Repeat phrase 0 points 6 points 6 points  Total Score 2 points 6 points     Immunizations Immunization History  Administered Date(s) Administered   Fluad Quad(high Dose 65+) 03/20/2019, 05/20/2020, 04/21/2021, 05/09/2022   Hepb-cpg 07/19/2022, 08/21/2022   Influenza Split 04/23/2012   Influenza Whole 03/31/2009   Influenza, High Dose Seasonal PF 04/07/2015, 04/21/2016, 04/27/2017, 04/23/2018   Influenza,inj,Quad PF,6+ Mos 04/21/2014   PFIZER(Purple Top)SARS-COV-2 Vaccination 09/14/2019, 10/07/2019, 06/25/2020   Pneumococcal Conjugate-13 07/06/2014   Pneumococcal Polysaccharide-23 09/06/1998, 06/19/2019   Zoster Recombinat (Shingrix) 11/30/2020, 02/11/2021    TDAP status: Due, Education has been provided regarding the importance of this vaccine. Advised may receive this vaccine at local pharmacy or Health Dept. Aware to provide a copy of the vaccination record if obtained from local pharmacy or Health Dept. Verbalized acceptance and understanding.  Flu Vaccine status: Up to date  Pneumococcal vaccine status: Up to date  Covid-19 vaccine status: Completed vaccines  Qualifies for Shingles Vaccine? Yes   Zostavax completed Yes   Shingrix Completed?: Yes  Screening Tests Health Maintenance  Topic Date Due   DTaP/Tdap/Td (1 - Tdap) Never done   OPHTHALMOLOGY EXAM  10/13/2022 (Originally 08/04/2021)   Diabetic kidney evaluation - Urine ACR  10/14/2022  (Originally 04/26/2022)   FOOT EXAM  10/14/2022 (Originally 12/15/2021)   COVID-19 Vaccine (4 - 2023-24 season) 10/29/2022 (Originally 03/31/2022)   COLONOSCOPY (Pts 45-30yrs Insurance coverage will need to be confirmed)  10/13/2023 (Originally 07/31/2013)   HEMOGLOBIN A1C  03/13/2023   Diabetic kidney evaluation - eGFR measurement  09/13/2023   Medicare Annual Wellness (AWV)  10/13/2023   MAMMOGRAM  10/14/2023   Pneumonia Vaccine 64+ Years old  Completed   INFLUENZA VACCINE  Completed   DEXA SCAN  Completed   Hepatitis C Screening  Completed   Zoster Vaccines- Shingrix  Completed   HPV VACCINES  Aged Out    Health Maintenance  Health Maintenance Due  Topic Date Due   DTaP/Tdap/Td (1 - Tdap) Never done      Mammogram status: Ordered Scheduled for 11/15/22. Pt provided with contact info and advised to call to schedule appt.   Bone Density status: Completed 12/24/19. Results reflect: Bone density results: OSTEOPOROSIS. Repeat every   years.  Lung Cancer Screening: (Low Dose CT Chest recommended if Age 59-80 years, 30 pack-year currently smoking OR have quit w/in 15years.) does not qualify.     Additional Screening:  Hepatitis C Screening: does qualify; Completed 02/14/22  Vision Screening: Recommended annual ophthalmology exams for early detection of glaucoma and other disorders of the eye. Is the patient up to date with their annual eye exam?  Yes  Who is the provider or what is the name of the office in which the patient attends annual eye exams? Dr Katy Fitch If pt is not established with a provider, would they like to be referred to a provider to establish care? No .   Dental Screening: Recommended annual dental exams for proper oral hygiene  Community Resource Referral / Chronic Care Management:  CRR required this visit?  No   CCM required this visit?  No      Plan:     I have personally reviewed and noted the following in the patient's chart:   Medical and social  history Use of alcohol, tobacco or illicit drugs  Current medications and supplements including opioid prescriptions. Patient is not currently taking opioid prescriptions. Functional ability and status Nutritional status Physical activity Advanced directives List of other physicians Hospitalizations, surgeries, and ER visits in previous 12 months Vitals Screenings to include cognitive, depression, and falls Referrals and appointments  In addition, I have reviewed and discussed with patient certain preventive protocols, quality metrics, and best practice recommendations. A written personalized care plan for preventive services as well as general preventive health recommendations were provided to patient.     Criselda Peaches, LPN   [id number]   Nurse Notes: Patient due Diabetic kidney evaluation-Urine ACR

## 2022-10-13 NOTE — Patient Instructions (Addendum)
Kelli Martinez , Thank you for taking time to come for your Medicare Wellness Visit. I appreciate your ongoing commitment to your health goals. Please review the following plan we discussed and let me know if I can assist you in the future.   These are the goals we discussed:  Goals       Increase physical activity (pt-stated)      I want to build up my muscle and legs.      Patient Stated      Lose weight      Patient Stated      10/11/2021, get under 200 pounds        This is a list of the screening recommended for you and due dates:  Health Maintenance  Topic Date Due   DTaP/Tdap/Td vaccine (1 - Tdap) Never done   Eye exam for diabetics  10/13/2022*   Yearly kidney health urinalysis for diabetes  10/14/2022*   Complete foot exam   10/14/2022*   COVID-19 Vaccine (4 - 2023-24 season) 10/29/2022*   Colon Cancer Screening  10/13/2023*   Hemoglobin A1C  03/13/2023   Yearly kidney function blood test for diabetes  09/13/2023   Medicare Annual Wellness Visit  10/13/2023   Mammogram  10/14/2023   Pneumonia Vaccine  Completed   Flu Shot  Completed   DEXA scan (bone density measurement)  Completed   Hepatitis C Screening: USPSTF Recommendation to screen - Ages 66-79 yo.  Completed   Zoster (Shingles) Vaccine  Completed   HPV Vaccine  Aged Out  *Topic was postponed. The date shown is not the original due date.    Advanced directives: Advance directive discussed with you today. Even though you declined this today, please call our office should you change your mind, and we can give you the proper paperwork for you to fill out.   Conditions/risks identified: None  Next appointment: Follow up in one year for your annual wellness visit.   Preventive Care 2 Years and Older, Female  Preventive care refers to lifestyle choices and visits with your health care provider that can promote health and wellness. What does preventive care include? A yearly physical exam. This is also called an  annual well check. Dental exams once or twice a year. Routine eye exams. Ask your health care provider how often you should have your eyes checked. Personal lifestyle choices, including: Daily care of your teeth and gums. Regular physical activity. Eating a healthy diet. Avoiding tobacco and drug use. Limiting alcohol use. Practicing safe sex. Taking low doses of aspirin every day. Taking vitamin and mineral supplements as recommended by your health care provider. What happens during an annual well check? The services and screenings done by your health care provider during your annual well check will depend on your age, overall health, lifestyle risk factors, and family history of disease. Counseling  Your health care provider may ask you questions about your: Alcohol use. Tobacco use. Drug use. Emotional well-being. Home and relationship well-being. Sexual activity. Eating habits. History of falls. Memory and ability to understand (cognition). Work and work Statistician. Screening  You may have the following tests or measurements: Height, weight, and BMI. Blood pressure. Lipid and cholesterol levels. These may be checked every 5 years, or more frequently if you are over 82 years old. Skin check. Lung cancer screening. You may have this screening every year starting at age 48 if you have a 30-pack-year history of smoking and currently smoke or have quit within  the past 15 years. Fecal occult blood test (FOBT) of the stool. You may have this test every year starting at age 38. Flexible sigmoidoscopy or colonoscopy. You may have a sigmoidoscopy every 5 years or a colonoscopy every 10 years starting at age 15. Prostate cancer screening. Recommendations will vary depending on your family history and other risks. Hepatitis C blood test. Hepatitis B blood test. Sexually transmitted disease (STD) testing. Diabetes screening. This is done by checking your blood sugar (glucose) after you  have not eaten for a while (fasting). You may have this done every 1-3 years. Abdominal aortic aneurysm (AAA) screening. You may need this if you are a current or former smoker. Osteoporosis. You may be screened starting at age 86 if you are at high risk. Talk with your health care provider about your test results, treatment options, and if necessary, the need for more tests. Vaccines  Your health care provider may recommend certain vaccines, such as: Influenza vaccine. This is recommended every year. Tetanus, diphtheria, and acellular pertussis (Tdap, Td) vaccine. You may need a Td booster every 10 years. Zoster vaccine. You may need this after age 38. Pneumococcal 13-valent conjugate (PCV13) vaccine. One dose is recommended after age 52. Pneumococcal polysaccharide (PPSV23) vaccine. One dose is recommended after age 67. Talk to your health care provider about which screenings and vaccines you need and how often you need them. This information is not intended to replace advice given to you by your health care provider. Make sure you discuss any questions you have with your health care provider. Document Released: 08/13/2015 Document Revised: 04/05/2016 Document Reviewed: 05/18/2015 Elsevier Interactive Patient Education  2017 [place] Prevention in the Home Falls can cause injuries. They can happen to people of all ages. There are many things you can do to make your home safe and to help prevent falls. What can I do on the outside of my home? Regularly fix the edges of walkways and driveways and fix any cracks. Remove anything that might make you trip as you walk through a door, such as a raised step or threshold. Trim any bushes or trees on the path to your home. Use bright outdoor lighting. Clear any walking paths of anything that might make someone trip, such as rocks or tools. Regularly check to see if handrails are loose or broken. Make sure that both sides of any steps have  handrails. Any raised decks and porches should have guardrails on the edges. Have any leaves, snow, or ice cleared regularly. Use sand or salt on walking paths during winter. Clean up any spills in your garage right away. This includes oil or grease spills. What can I do in the bathroom? Use night lights. Install grab bars by the toilet and in the tub and shower. Do not use towel bars as grab bars. Use non-skid mats or decals in the tub or shower. If you need to sit down in the shower, use a plastic, non-slip stool. Keep the floor dry. Clean up any water that spills on the floor as soon as it happens. Remove soap buildup in the tub or shower regularly. Attach bath mats securely with double-sided non-slip rug tape. Do not have throw rugs and other things on the floor that can make you trip. What can I do in the bedroom? Use night lights. Make sure that you have a light by your bed that is easy to reach. Do not use any sheets or blankets that are too big  for your bed. They should not hang down onto the floor. Have a firm chair that has side arms. You can use this for support while you get dressed. Do not have throw rugs and other things on the floor that can make you trip. What can I do in the kitchen? Clean up any spills right away. Avoid walking on wet floors. Keep items that you use a lot in easy-to-reach places. If you need to reach something above you, use a strong step stool that has a grab bar. Keep electrical cords out of the way. Do not use floor polish or wax that makes floors slippery. If you must use wax, use non-skid floor wax. Do not have throw rugs and other things on the floor that can make you trip. What can I do with my stairs? Do not leave any items on the stairs. Make sure that there are handrails on both sides of the stairs and use them. Fix handrails that are broken or loose. Make sure that handrails are as long as the stairways. Check any carpeting to make sure  that it is firmly attached to the stairs. Fix any carpet that is loose or worn. Avoid having throw rugs at the top or bottom of the stairs. If you do have throw rugs, attach them to the floor with carpet tape. Make sure that you have a light switch at the top of the stairs and the bottom of the stairs. If you do not have them, ask someone to add them for you. What else can I do to help prevent falls? Wear shoes that: Do not have high heels. Have rubber bottoms. Are comfortable and fit you well. Are closed at the toe. Do not wear sandals. If you use a stepladder: Make sure that it is fully opened. Do not climb a closed stepladder. Make sure that both sides of the stepladder are locked into place. Ask someone to hold it for you, if possible. Clearly mark and make sure that you can see: Any grab bars or handrails. First and last steps. Where the edge of each step is. Use tools that help you move around (mobility aids) if they are needed. These include: Canes. Walkers. Scooters. Crutches. Turn on the lights when you go into a dark area. Replace any light bulbs as soon as they burn out. Set up your furniture so you have a clear path. Avoid moving your furniture around. If any of your floors are uneven, fix them. If there are any pets around you, be aware of where they are. Review your medicines with your doctor. Some medicines can make you feel dizzy. This can increase your chance of falling. Ask your doctor what other things that you can do to help prevent falls. This information is not intended to replace advice given to you by your health care provider. Make sure you discuss any questions you have with your health care provider. Document Released: 05/13/2009 Document Revised: 12/23/2015 Document Reviewed: 08/21/2014 Elsevier Interactive Patient Education  2017 [employer].

## 2022-10-16 DIAGNOSIS — E1142 Type 2 diabetes mellitus with diabetic polyneuropathy: Secondary | ICD-10-CM | POA: Diagnosis not present

## 2022-10-16 DIAGNOSIS — M5432 Sciatica, left side: Secondary | ICD-10-CM | POA: Diagnosis not present

## 2022-10-16 DIAGNOSIS — I872 Venous insufficiency (chronic) (peripheral): Secondary | ICD-10-CM | POA: Diagnosis not present

## 2022-10-16 DIAGNOSIS — I10 Essential (primary) hypertension: Secondary | ICD-10-CM | POA: Diagnosis not present

## 2022-10-16 DIAGNOSIS — J4530 Mild persistent asthma, uncomplicated: Secondary | ICD-10-CM | POA: Diagnosis not present

## 2022-10-16 DIAGNOSIS — E039 Hypothyroidism, unspecified: Secondary | ICD-10-CM | POA: Diagnosis not present

## 2022-10-17 ENCOUNTER — Ambulatory Visit (INDEPENDENT_AMBULATORY_CARE_PROVIDER_SITE_OTHER): Payer: Medicare Other

## 2022-10-17 DIAGNOSIS — Z7901 Long term (current) use of anticoagulants: Secondary | ICD-10-CM

## 2022-10-17 LAB — POCT INR: INR: 1.9 — AB (ref 2.0–3.0)

## 2022-10-17 NOTE — Progress Notes (Signed)
Increase dose today to take 3 tablets and then change weekly dose to take  2 tablets daily. Recheck in 3 weeks.

## 2022-10-17 NOTE — Patient Instructions (Addendum)
Pre visit review using our clinic review tool, if applicable. No additional management support is needed unless otherwise documented below in the visit note.  Increase dose today to take 3 tablets and then change weekly dose to take  2 tablets daily. Recheck in 3 weeks.

## 2022-10-18 DIAGNOSIS — E039 Hypothyroidism, unspecified: Secondary | ICD-10-CM | POA: Diagnosis not present

## 2022-10-18 DIAGNOSIS — J4530 Mild persistent asthma, uncomplicated: Secondary | ICD-10-CM | POA: Diagnosis not present

## 2022-10-18 DIAGNOSIS — I872 Venous insufficiency (chronic) (peripheral): Secondary | ICD-10-CM | POA: Diagnosis not present

## 2022-10-18 DIAGNOSIS — I10 Essential (primary) hypertension: Secondary | ICD-10-CM | POA: Diagnosis not present

## 2022-10-18 DIAGNOSIS — M5432 Sciatica, left side: Secondary | ICD-10-CM | POA: Diagnosis not present

## 2022-10-18 DIAGNOSIS — E1142 Type 2 diabetes mellitus with diabetic polyneuropathy: Secondary | ICD-10-CM | POA: Diagnosis not present

## 2022-10-21 DIAGNOSIS — Z9011 Acquired absence of right breast and nipple: Secondary | ICD-10-CM | POA: Diagnosis not present

## 2022-10-21 DIAGNOSIS — I872 Venous insufficiency (chronic) (peripheral): Secondary | ICD-10-CM | POA: Diagnosis not present

## 2022-10-21 DIAGNOSIS — E785 Hyperlipidemia, unspecified: Secondary | ICD-10-CM | POA: Diagnosis not present

## 2022-10-21 DIAGNOSIS — Z853 Personal history of malignant neoplasm of breast: Secondary | ICD-10-CM | POA: Diagnosis not present

## 2022-10-21 DIAGNOSIS — J302 Other seasonal allergic rhinitis: Secondary | ICD-10-CM | POA: Diagnosis not present

## 2022-10-21 DIAGNOSIS — G4733 Obstructive sleep apnea (adult) (pediatric): Secondary | ICD-10-CM | POA: Diagnosis not present

## 2022-10-21 DIAGNOSIS — M5432 Sciatica, left side: Secondary | ICD-10-CM | POA: Diagnosis not present

## 2022-10-21 DIAGNOSIS — Z7901 Long term (current) use of anticoagulants: Secondary | ICD-10-CM | POA: Diagnosis not present

## 2022-10-21 DIAGNOSIS — Z556 Problems related to health literacy: Secondary | ICD-10-CM | POA: Diagnosis not present

## 2022-10-21 DIAGNOSIS — Z86711 Personal history of pulmonary embolism: Secondary | ICD-10-CM | POA: Diagnosis not present

## 2022-10-21 DIAGNOSIS — I10 Essential (primary) hypertension: Secondary | ICD-10-CM | POA: Diagnosis not present

## 2022-10-21 DIAGNOSIS — M810 Age-related osteoporosis without current pathological fracture: Secondary | ICD-10-CM | POA: Diagnosis not present

## 2022-10-21 DIAGNOSIS — M1990 Unspecified osteoarthritis, unspecified site: Secondary | ICD-10-CM | POA: Diagnosis not present

## 2022-10-21 DIAGNOSIS — Z9049 Acquired absence of other specified parts of digestive tract: Secondary | ICD-10-CM | POA: Diagnosis not present

## 2022-10-21 DIAGNOSIS — Z90710 Acquired absence of both cervix and uterus: Secondary | ICD-10-CM | POA: Diagnosis not present

## 2022-10-21 DIAGNOSIS — G8929 Other chronic pain: Secondary | ICD-10-CM | POA: Diagnosis not present

## 2022-10-21 DIAGNOSIS — J4530 Mild persistent asthma, uncomplicated: Secondary | ICD-10-CM | POA: Diagnosis not present

## 2022-10-21 DIAGNOSIS — H547 Unspecified visual loss: Secondary | ICD-10-CM | POA: Diagnosis not present

## 2022-10-21 DIAGNOSIS — K219 Gastro-esophageal reflux disease without esophagitis: Secondary | ICD-10-CM | POA: Diagnosis not present

## 2022-10-21 DIAGNOSIS — E039 Hypothyroidism, unspecified: Secondary | ICD-10-CM | POA: Diagnosis not present

## 2022-10-21 DIAGNOSIS — D649 Anemia, unspecified: Secondary | ICD-10-CM | POA: Diagnosis not present

## 2022-10-21 DIAGNOSIS — Z87891 Personal history of nicotine dependence: Secondary | ICD-10-CM | POA: Diagnosis not present

## 2022-10-21 DIAGNOSIS — E1142 Type 2 diabetes mellitus with diabetic polyneuropathy: Secondary | ICD-10-CM | POA: Diagnosis not present

## 2022-10-25 DIAGNOSIS — I872 Venous insufficiency (chronic) (peripheral): Secondary | ICD-10-CM | POA: Diagnosis not present

## 2022-10-25 DIAGNOSIS — J4530 Mild persistent asthma, uncomplicated: Secondary | ICD-10-CM | POA: Diagnosis not present

## 2022-10-25 DIAGNOSIS — I10 Essential (primary) hypertension: Secondary | ICD-10-CM | POA: Diagnosis not present

## 2022-10-25 DIAGNOSIS — M5432 Sciatica, left side: Secondary | ICD-10-CM | POA: Diagnosis not present

## 2022-10-25 DIAGNOSIS — E039 Hypothyroidism, unspecified: Secondary | ICD-10-CM | POA: Diagnosis not present

## 2022-10-25 DIAGNOSIS — E1142 Type 2 diabetes mellitus with diabetic polyneuropathy: Secondary | ICD-10-CM | POA: Diagnosis not present

## 2022-10-26 DIAGNOSIS — Z961 Presence of intraocular lens: Secondary | ICD-10-CM | POA: Diagnosis not present

## 2022-10-26 DIAGNOSIS — H401123 Primary open-angle glaucoma, left eye, severe stage: Secondary | ICD-10-CM | POA: Diagnosis not present

## 2022-10-27 DIAGNOSIS — I872 Venous insufficiency (chronic) (peripheral): Secondary | ICD-10-CM | POA: Diagnosis not present

## 2022-10-27 DIAGNOSIS — E039 Hypothyroidism, unspecified: Secondary | ICD-10-CM | POA: Diagnosis not present

## 2022-10-27 DIAGNOSIS — M5432 Sciatica, left side: Secondary | ICD-10-CM | POA: Diagnosis not present

## 2022-10-27 DIAGNOSIS — I10 Essential (primary) hypertension: Secondary | ICD-10-CM | POA: Diagnosis not present

## 2022-10-27 DIAGNOSIS — E1142 Type 2 diabetes mellitus with diabetic polyneuropathy: Secondary | ICD-10-CM | POA: Diagnosis not present

## 2022-10-27 DIAGNOSIS — J4530 Mild persistent asthma, uncomplicated: Secondary | ICD-10-CM | POA: Diagnosis not present

## 2022-10-30 DIAGNOSIS — E039 Hypothyroidism, unspecified: Secondary | ICD-10-CM | POA: Diagnosis not present

## 2022-10-30 DIAGNOSIS — J4530 Mild persistent asthma, uncomplicated: Secondary | ICD-10-CM | POA: Diagnosis not present

## 2022-10-30 DIAGNOSIS — I10 Essential (primary) hypertension: Secondary | ICD-10-CM | POA: Diagnosis not present

## 2022-10-30 DIAGNOSIS — M5432 Sciatica, left side: Secondary | ICD-10-CM | POA: Diagnosis not present

## 2022-10-30 DIAGNOSIS — E1142 Type 2 diabetes mellitus with diabetic polyneuropathy: Secondary | ICD-10-CM | POA: Diagnosis not present

## 2022-10-30 DIAGNOSIS — I872 Venous insufficiency (chronic) (peripheral): Secondary | ICD-10-CM | POA: Diagnosis not present

## 2022-11-01 DIAGNOSIS — I872 Venous insufficiency (chronic) (peripheral): Secondary | ICD-10-CM | POA: Diagnosis not present

## 2022-11-01 DIAGNOSIS — E1142 Type 2 diabetes mellitus with diabetic polyneuropathy: Secondary | ICD-10-CM | POA: Diagnosis not present

## 2022-11-01 DIAGNOSIS — J4530 Mild persistent asthma, uncomplicated: Secondary | ICD-10-CM | POA: Diagnosis not present

## 2022-11-01 DIAGNOSIS — E039 Hypothyroidism, unspecified: Secondary | ICD-10-CM | POA: Diagnosis not present

## 2022-11-01 DIAGNOSIS — M5432 Sciatica, left side: Secondary | ICD-10-CM | POA: Diagnosis not present

## 2022-11-01 DIAGNOSIS — I10 Essential (primary) hypertension: Secondary | ICD-10-CM | POA: Diagnosis not present

## 2022-11-02 ENCOUNTER — Ambulatory Visit: Payer: Medicare Other

## 2022-11-02 DIAGNOSIS — G4733 Obstructive sleep apnea (adult) (pediatric): Secondary | ICD-10-CM

## 2022-11-02 DIAGNOSIS — R0683 Snoring: Secondary | ICD-10-CM | POA: Diagnosis not present

## 2022-11-03 DIAGNOSIS — R0683 Snoring: Secondary | ICD-10-CM | POA: Diagnosis not present

## 2022-11-07 ENCOUNTER — Ambulatory Visit (INDEPENDENT_AMBULATORY_CARE_PROVIDER_SITE_OTHER): Payer: Medicare Other

## 2022-11-07 DIAGNOSIS — Z7901 Long term (current) use of anticoagulants: Secondary | ICD-10-CM | POA: Diagnosis not present

## 2022-11-07 LAB — POCT INR: INR: 2.6 (ref 2.0–3.0)

## 2022-11-07 NOTE — Patient Instructions (Addendum)
Pre visit review using our clinic review tool, if applicable. No additional management support is needed unless otherwise documented below in the visit note.  Continue 2 tablets daily. Recheck in 6 weeks.  

## 2022-11-07 NOTE — Progress Notes (Signed)
Continue 2 tablets daily. Recheck in 6 weeks.  ?

## 2022-11-08 DIAGNOSIS — I872 Venous insufficiency (chronic) (peripheral): Secondary | ICD-10-CM | POA: Diagnosis not present

## 2022-11-08 DIAGNOSIS — M5432 Sciatica, left side: Secondary | ICD-10-CM | POA: Diagnosis not present

## 2022-11-08 DIAGNOSIS — I10 Essential (primary) hypertension: Secondary | ICD-10-CM | POA: Diagnosis not present

## 2022-11-08 DIAGNOSIS — J4530 Mild persistent asthma, uncomplicated: Secondary | ICD-10-CM | POA: Diagnosis not present

## 2022-11-08 DIAGNOSIS — E039 Hypothyroidism, unspecified: Secondary | ICD-10-CM | POA: Diagnosis not present

## 2022-11-08 DIAGNOSIS — E1142 Type 2 diabetes mellitus with diabetic polyneuropathy: Secondary | ICD-10-CM | POA: Diagnosis not present

## 2022-11-09 DIAGNOSIS — I872 Venous insufficiency (chronic) (peripheral): Secondary | ICD-10-CM | POA: Diagnosis not present

## 2022-11-09 DIAGNOSIS — M5432 Sciatica, left side: Secondary | ICD-10-CM | POA: Diagnosis not present

## 2022-11-09 DIAGNOSIS — E1142 Type 2 diabetes mellitus with diabetic polyneuropathy: Secondary | ICD-10-CM | POA: Diagnosis not present

## 2022-11-09 DIAGNOSIS — I10 Essential (primary) hypertension: Secondary | ICD-10-CM | POA: Diagnosis not present

## 2022-11-09 DIAGNOSIS — E039 Hypothyroidism, unspecified: Secondary | ICD-10-CM | POA: Diagnosis not present

## 2022-11-09 DIAGNOSIS — J4530 Mild persistent asthma, uncomplicated: Secondary | ICD-10-CM | POA: Diagnosis not present

## 2022-11-13 ENCOUNTER — Telehealth: Payer: Self-pay | Admitting: Family Medicine

## 2022-11-13 DIAGNOSIS — E1142 Type 2 diabetes mellitus with diabetic polyneuropathy: Secondary | ICD-10-CM | POA: Diagnosis not present

## 2022-11-13 DIAGNOSIS — E039 Hypothyroidism, unspecified: Secondary | ICD-10-CM | POA: Diagnosis not present

## 2022-11-13 DIAGNOSIS — J4530 Mild persistent asthma, uncomplicated: Secondary | ICD-10-CM | POA: Diagnosis not present

## 2022-11-13 DIAGNOSIS — I10 Essential (primary) hypertension: Secondary | ICD-10-CM | POA: Diagnosis not present

## 2022-11-13 DIAGNOSIS — M5432 Sciatica, left side: Secondary | ICD-10-CM | POA: Diagnosis not present

## 2022-11-13 DIAGNOSIS — I872 Venous insufficiency (chronic) (peripheral): Secondary | ICD-10-CM | POA: Diagnosis not present

## 2022-11-13 NOTE — Telephone Encounter (Signed)
Ok.  Pt should make cardiology aware of 5 pound weight gain in 1 day.  Will likely need additional Lasix and potassium to help with increased edema.  Patient is having any SOB, CP, increased fatigue, etc. proceed to nearest emergency department for further evaluation.

## 2022-11-13 NOTE — Telephone Encounter (Signed)
Requesting discharge from home health OT effective today also reporting 5 pound weight gain overnight.

## 2022-11-15 ENCOUNTER — Ambulatory Visit
Admission: RE | Admit: 2022-11-15 | Discharge: 2022-11-15 | Disposition: A | Discharge status: Home / Self Care | Payer: Medicare Other | Source: Ambulatory Visit | Attending: Surgery | Admitting: Surgery

## 2022-11-15 DIAGNOSIS — Z1231 Encounter for screening mammogram for malignant neoplasm of breast: Secondary | ICD-10-CM

## 2022-11-15 NOTE — Telephone Encounter (Signed)
Left VM on Kelli Martinez's confidential VM giving okay for discharge from [facility] OT.

## 2022-11-16 DIAGNOSIS — E039 Hypothyroidism, unspecified: Secondary | ICD-10-CM | POA: Diagnosis not present

## 2022-11-16 DIAGNOSIS — M5432 Sciatica, left side: Secondary | ICD-10-CM | POA: Diagnosis not present

## 2022-11-16 DIAGNOSIS — I872 Venous insufficiency (chronic) (peripheral): Secondary | ICD-10-CM | POA: Diagnosis not present

## 2022-11-16 DIAGNOSIS — E1142 Type 2 diabetes mellitus with diabetic polyneuropathy: Secondary | ICD-10-CM | POA: Diagnosis not present

## 2022-11-16 DIAGNOSIS — I10 Essential (primary) hypertension: Secondary | ICD-10-CM | POA: Diagnosis not present

## 2022-11-16 DIAGNOSIS — J4530 Mild persistent asthma, uncomplicated: Secondary | ICD-10-CM | POA: Diagnosis not present

## 2022-11-17 ENCOUNTER — Other Ambulatory Visit: Payer: Self-pay | Admitting: Internal Medicine

## 2022-12-07 DIAGNOSIS — Z9884 Bariatric surgery status: Secondary | ICD-10-CM | POA: Diagnosis not present

## 2022-12-07 DIAGNOSIS — Z1321 Encounter for screening for nutritional disorder: Secondary | ICD-10-CM | POA: Diagnosis not present

## 2022-12-07 DIAGNOSIS — R5383 Other fatigue: Secondary | ICD-10-CM | POA: Diagnosis not present

## 2022-12-19 ENCOUNTER — Ambulatory Visit (INDEPENDENT_AMBULATORY_CARE_PROVIDER_SITE_OTHER): Payer: Medicare Other

## 2022-12-19 DIAGNOSIS — Z7901 Long term (current) use of anticoagulants: Secondary | ICD-10-CM | POA: Diagnosis not present

## 2022-12-19 LAB — POCT INR: INR: 3.7 — AB (ref 2.0–3.0)

## 2022-12-19 NOTE — Progress Notes (Signed)
Hold dose today and then change weekly dose today to take 2 tablets daily except take 1 tablet on Wednesdays. Recheck in 3 weeks.

## 2022-12-19 NOTE — Patient Instructions (Addendum)
Pre visit review using our clinic review tool, if applicable. No additional management support is needed unless otherwise documented below in the visit note.  Hold dose today and then change weekly dose today to take 2 tablets daily except take 1 tablet on Wednesdays. Recheck in 3 weeks.

## 2022-12-19 NOTE — Progress Notes (Signed)
Pt reports eating less vitamin K containing foods due to not feeling well; head and nasal congestion.

## 2022-12-28 ENCOUNTER — Other Ambulatory Visit: Payer: Self-pay | Admitting: Family Medicine

## 2022-12-28 DIAGNOSIS — E782 Mixed hyperlipidemia: Secondary | ICD-10-CM

## 2023-01-02 ENCOUNTER — Other Ambulatory Visit: Payer: Self-pay

## 2023-01-02 DIAGNOSIS — Z79899 Other long term (current) drug therapy: Secondary | ICD-10-CM

## 2023-01-03 DIAGNOSIS — I739 Peripheral vascular disease, unspecified: Secondary | ICD-10-CM | POA: Diagnosis not present

## 2023-01-03 DIAGNOSIS — E1151 Type 2 diabetes mellitus with diabetic peripheral angiopathy without gangrene: Secondary | ICD-10-CM | POA: Diagnosis not present

## 2023-01-03 DIAGNOSIS — L84 Corns and callosities: Secondary | ICD-10-CM | POA: Diagnosis not present

## 2023-01-04 ENCOUNTER — Emergency Department (HOSPITAL_COMMUNITY): Payer: Medicare Other

## 2023-01-04 ENCOUNTER — Observation Stay (HOSPITAL_COMMUNITY)
Admission: EM | Admit: 2023-01-04 | Discharge: 2023-01-05 | Disposition: A | Discharge status: Home / Self Care | Payer: Medicare Other | Attending: Family Medicine | Admitting: Family Medicine

## 2023-01-04 ENCOUNTER — Observation Stay (HOSPITAL_COMMUNITY): Payer: Medicare Other

## 2023-01-04 ENCOUNTER — Telehealth: Payer: Self-pay | Admitting: Family Medicine

## 2023-01-04 DIAGNOSIS — Y92003 Bedroom of unspecified non-institutional (private) residence as the place of occurrence of the external cause: Secondary | ICD-10-CM | POA: Insufficient documentation

## 2023-01-04 DIAGNOSIS — J4530 Mild persistent asthma, uncomplicated: Secondary | ICD-10-CM | POA: Diagnosis present

## 2023-01-04 DIAGNOSIS — Z87891 Personal history of nicotine dependence: Secondary | ICD-10-CM | POA: Diagnosis not present

## 2023-01-04 DIAGNOSIS — R0781 Pleurodynia: Secondary | ICD-10-CM | POA: Diagnosis present

## 2023-01-04 DIAGNOSIS — R55 Syncope and collapse: Secondary | ICD-10-CM | POA: Diagnosis not present

## 2023-01-04 DIAGNOSIS — W01198A Fall on same level from slipping, tripping and stumbling with subsequent striking against other object, initial encounter: Secondary | ICD-10-CM | POA: Diagnosis not present

## 2023-01-04 DIAGNOSIS — M79672 Pain in left foot: Secondary | ICD-10-CM | POA: Diagnosis not present

## 2023-01-04 DIAGNOSIS — N179 Acute kidney failure, unspecified: Secondary | ICD-10-CM | POA: Diagnosis not present

## 2023-01-04 DIAGNOSIS — Z853 Personal history of malignant neoplasm of breast: Secondary | ICD-10-CM

## 2023-01-04 DIAGNOSIS — M542 Cervicalgia: Secondary | ICD-10-CM | POA: Diagnosis not present

## 2023-01-04 DIAGNOSIS — Z86711 Personal history of pulmonary embolism: Secondary | ICD-10-CM | POA: Diagnosis not present

## 2023-01-04 DIAGNOSIS — E039 Hypothyroidism, unspecified: Secondary | ICD-10-CM | POA: Diagnosis present

## 2023-01-04 DIAGNOSIS — J479 Bronchiectasis, uncomplicated: Secondary | ICD-10-CM | POA: Diagnosis not present

## 2023-01-04 DIAGNOSIS — Z86718 Personal history of other venous thrombosis and embolism: Secondary | ICD-10-CM | POA: Diagnosis not present

## 2023-01-04 DIAGNOSIS — R102 Pelvic and perineal pain: Secondary | ICD-10-CM | POA: Diagnosis not present

## 2023-01-04 DIAGNOSIS — W19XXXA Unspecified fall, initial encounter: Secondary | ICD-10-CM | POA: Diagnosis not present

## 2023-01-04 DIAGNOSIS — E669 Obesity, unspecified: Secondary | ICD-10-CM | POA: Diagnosis not present

## 2023-01-04 DIAGNOSIS — Z7952 Long term (current) use of systemic steroids: Secondary | ICD-10-CM | POA: Insufficient documentation

## 2023-01-04 DIAGNOSIS — Z7901 Long term (current) use of anticoagulants: Secondary | ICD-10-CM | POA: Diagnosis not present

## 2023-01-04 DIAGNOSIS — S0990XA Unspecified injury of head, initial encounter: Secondary | ICD-10-CM | POA: Diagnosis not present

## 2023-01-04 DIAGNOSIS — Z79899 Other long term (current) drug therapy: Secondary | ICD-10-CM | POA: Insufficient documentation

## 2023-01-04 DIAGNOSIS — J45909 Unspecified asthma, uncomplicated: Secondary | ICD-10-CM | POA: Insufficient documentation

## 2023-01-04 DIAGNOSIS — E1143 Type 2 diabetes mellitus with diabetic autonomic (poly)neuropathy: Secondary | ICD-10-CM | POA: Diagnosis not present

## 2023-01-04 DIAGNOSIS — M25519 Pain in unspecified shoulder: Secondary | ICD-10-CM | POA: Diagnosis not present

## 2023-01-04 DIAGNOSIS — M25512 Pain in left shoulder: Secondary | ICD-10-CM | POA: Diagnosis not present

## 2023-01-04 DIAGNOSIS — N281 Cyst of kidney, acquired: Secondary | ICD-10-CM | POA: Diagnosis not present

## 2023-01-04 DIAGNOSIS — I10 Essential (primary) hypertension: Secondary | ICD-10-CM | POA: Diagnosis not present

## 2023-01-04 DIAGNOSIS — K3189 Other diseases of stomach and duodenum: Secondary | ICD-10-CM | POA: Diagnosis not present

## 2023-01-04 DIAGNOSIS — R42 Dizziness and giddiness: Secondary | ICD-10-CM | POA: Insufficient documentation

## 2023-01-04 DIAGNOSIS — R519 Headache, unspecified: Secondary | ICD-10-CM | POA: Diagnosis not present

## 2023-01-04 DIAGNOSIS — G4733 Obstructive sleep apnea (adult) (pediatric): Secondary | ICD-10-CM | POA: Diagnosis present

## 2023-01-04 DIAGNOSIS — Z9884 Bariatric surgery status: Secondary | ICD-10-CM

## 2023-01-04 DIAGNOSIS — E1142 Type 2 diabetes mellitus with diabetic polyneuropathy: Secondary | ICD-10-CM | POA: Insufficient documentation

## 2023-01-04 DIAGNOSIS — M7989 Other specified soft tissue disorders: Secondary | ICD-10-CM | POA: Diagnosis not present

## 2023-01-04 DIAGNOSIS — J9811 Atelectasis: Secondary | ICD-10-CM | POA: Diagnosis not present

## 2023-01-04 DIAGNOSIS — K449 Diaphragmatic hernia without obstruction or gangrene: Secondary | ICD-10-CM | POA: Diagnosis not present

## 2023-01-04 DIAGNOSIS — I6523 Occlusion and stenosis of bilateral carotid arteries: Secondary | ICD-10-CM | POA: Diagnosis not present

## 2023-01-04 LAB — CBC WITH DIFFERENTIAL/PLATELET
Abs Immature Granulocytes: 0.03 10*3/uL (ref 0.00–0.07)
Basophils Absolute: 0.1 10*3/uL (ref 0.0–0.1)
Basophils Relative: 1 %
Eosinophils Absolute: 0.1 10*3/uL (ref 0.0–0.5)
Eosinophils Relative: 1 %
HCT: 37.1 % (ref 36.0–46.0)
Hemoglobin: 11.6 g/dL — ABNORMAL LOW (ref 12.0–15.0)
Immature Granulocytes: 0 %
Lymphocytes Relative: 21 %
Lymphs Abs: 1.4 10*3/uL (ref 0.7–4.0)
MCH: 28.1 pg (ref 26.0–34.0)
MCHC: 31.3 g/dL (ref 30.0–36.0)
MCV: 89.8 fL (ref 80.0–100.0)
Monocytes Absolute: 0.5 10*3/uL (ref 0.1–1.0)
Monocytes Relative: 8 %
Neutro Abs: 4.8 10*3/uL (ref 1.7–7.7)
Neutrophils Relative %: 69 %
Platelets: 248 10*3/uL (ref 150–400)
RBC: 4.13 MIL/uL (ref 3.87–5.11)
RDW: 14.1 % (ref 11.5–15.5)
WBC: 6.9 10*3/uL (ref 4.0–10.5)
nRBC: 0 % (ref 0.0–0.2)

## 2023-01-04 LAB — GLUCOSE, CAPILLARY
Glucose-Capillary: 125 mg/dL — ABNORMAL HIGH (ref 70–99)
Glucose-Capillary: 106 mg/dL — ABNORMAL HIGH (ref 70–99)
Glucose-Capillary: 117 mg/dL — ABNORMAL HIGH (ref 70–99)
Glucose-Capillary: 99 mg/dL (ref 70–99)

## 2023-01-04 LAB — HEMOGLOBIN A1C
Hgb A1c MFr Bld: 7.1 % — ABNORMAL HIGH (ref 4.8–5.6)
Mean Plasma Glucose: 157.07 mg/dL

## 2023-01-04 LAB — URINALYSIS, ROUTINE W REFLEX MICROSCOPIC
Bilirubin Urine: NEGATIVE
Glucose, UA: NEGATIVE mg/dL
Hgb urine dipstick: NEGATIVE
Ketones, ur: NEGATIVE mg/dL
Leukocytes,Ua: NEGATIVE
Nitrite: NEGATIVE
Protein, ur: NEGATIVE mg/dL
Specific Gravity, Urine: 1.012 (ref 1.005–1.030)
pH: 7 (ref 5.0–8.0)

## 2023-01-04 LAB — BASIC METABOLIC PANEL
Anion gap: 10 (ref 5–15)
BUN: 27 mg/dL — ABNORMAL HIGH (ref 8–23)
CO2: 27 mmol/L (ref 22–32)
Calcium: 9.3 mg/dL (ref 8.9–10.3)
Chloride: 97 mmol/L — ABNORMAL LOW (ref 98–111)
Creatinine, Ser: 1.71 mg/dL — ABNORMAL HIGH (ref 0.44–1.00)
GFR, Estimated: 31 mL/min — ABNORMAL LOW (ref >60)
Glucose, Bld: 124 mg/dL — ABNORMAL HIGH (ref 70–99)
Potassium: 4.8 mmol/L (ref 3.5–5.1)
Sodium: 134 mmol/L — ABNORMAL LOW (ref 135–145)

## 2023-01-04 LAB — LACTIC ACID, PLASMA: Lactic Acid, Venous: 1.3 mmol/L (ref 0.5–1.9)

## 2023-01-04 LAB — TROPONIN I (HIGH SENSITIVITY)
Troponin I (High Sensitivity): 8 ng/L (ref <18)
Troponin I (High Sensitivity): 8 ng/L (ref <18)

## 2023-01-04 LAB — I-STAT CHEM 8, ED
BUN: 28 mg/dL — ABNORMAL HIGH (ref 8–23)
Calcium, Ion: 1.17 mmol/L (ref 1.15–1.40)
Chloride: 98 mmol/L (ref 98–111)
Creatinine, Ser: 1.7 mg/dL — ABNORMAL HIGH (ref 0.44–1.00)
Glucose, Bld: 124 mg/dL — ABNORMAL HIGH (ref 70–99)
HCT: 38 % (ref 36.0–46.0)
Hemoglobin: 12.9 g/dL (ref 12.0–15.0)
Potassium: 4.8 mmol/L (ref 3.5–5.1)
Sodium: 136 mmol/L (ref 135–145)
TCO2: 31 mmol/L (ref 22–32)

## 2023-01-04 LAB — TSH: TSH: 1.133 u[IU]/mL (ref 0.350–4.500)

## 2023-01-04 LAB — PROTIME-INR
INR: 2.5 — ABNORMAL HIGH (ref 0.8–1.2)
Prothrombin Time: 27 seconds — ABNORMAL HIGH (ref 11.4–15.2)

## 2023-01-04 LAB — SAMPLE TO BLOOD BANK

## 2023-01-04 LAB — ETHANOL: Alcohol, Ethyl (B): <10 mg/dL (ref <10)

## 2023-01-04 MED ORDER — WARFARIN SODIUM 5 MG PO TABS
10.0000 mg | ORAL_TABLET | Freq: Once | ORAL | Status: AC
  Administered 2023-01-04: 10 mg via ORAL
  Filled 2023-01-04: qty 2

## 2023-01-04 MED ORDER — WARFARIN - PHARMACIST DOSING INPATIENT: Freq: Every day | Status: DC

## 2023-01-04 MED ORDER — ONDANSETRON HCL 4 MG PO TABS: 4.0000 mg | ORAL_TABLET | Freq: Four times a day (QID) | ORAL | Status: DC | PRN

## 2023-01-04 MED ORDER — BACLOFEN 10 MG PO TABS
20.0000 mg | ORAL_TABLET | Freq: Three times a day (TID) | ORAL | Status: DC
  Administered 2023-01-04 – 2023-01-05 (×4): 20 mg via ORAL
  Filled 2023-01-04 (×4): qty 2

## 2023-01-04 MED ORDER — MOMETASONE FURO-FORMOTEROL FUM 100-5 MCG/ACT IN AERO
2.0000 | INHALATION_SPRAY | Freq: Two times a day (BID) | RESPIRATORY_TRACT | Status: DC
  Administered 2023-01-04 – 2023-01-05 (×3): 2 via RESPIRATORY_TRACT
  Filled 2023-01-04: qty 8.8

## 2023-01-04 MED ORDER — PANTOPRAZOLE SODIUM 40 MG PO TBEC
40.0000 mg | DELAYED_RELEASE_TABLET | Freq: Every day | ORAL | Status: DC
  Administered 2023-01-04 – 2023-01-05 (×2): 40 mg via ORAL
  Filled 2023-01-04 (×2): qty 1

## 2023-01-04 MED ORDER — IOHEXOL 350 MG/ML SOLN
100.0000 mL | Freq: Once | INTRAVENOUS | Status: AC | PRN
  Administered 2023-01-04: 100 mL via INTRAVENOUS

## 2023-01-04 MED ORDER — ACARBOSE 25 MG PO TABS
50.0000 mg | ORAL_TABLET | Freq: Three times a day (TID) | ORAL | Status: DC
  Administered 2023-01-04 – 2023-01-05 (×3): 50 mg via ORAL
  Filled 2023-01-04 (×5): qty 2

## 2023-01-04 MED ORDER — FLUTICASONE PROPIONATE 50 MCG/ACT NA SUSP
1.0000 | Freq: Two times a day (BID) | NASAL | Status: DC
  Administered 2023-01-05 (×2): 1 via NASAL
  Filled 2023-01-04: qty 16

## 2023-01-04 MED ORDER — LEVOTHYROXINE SODIUM 100 MCG PO TABS
100.0000 ug | ORAL_TABLET | Freq: Every day | ORAL | Status: DC
  Administered 2023-01-04 – 2023-01-05 (×2): 100 ug via ORAL
  Filled 2023-01-04 (×2): qty 1

## 2023-01-04 MED ORDER — SODIUM CHLORIDE 0.9 % IV SOLN: INTRAVENOUS | Status: DC

## 2023-01-04 MED ORDER — ALBUTEROL SULFATE (2.5 MG/3ML) 0.083% IN NEBU: 2.5000 mg | INHALATION_SOLUTION | Freq: Four times a day (QID) | RESPIRATORY_TRACT | Status: DC | PRN

## 2023-01-04 MED ORDER — POLYVINYL ALCOHOL 1.4 % OP SOLN: 1.0000 [drp] | OPHTHALMIC | Status: DC | PRN

## 2023-01-04 MED ORDER — PRAVASTATIN SODIUM 10 MG PO TABS
10.0000 mg | ORAL_TABLET | Freq: Every day | ORAL | Status: DC
  Administered 2023-01-04 – 2023-01-05 (×2): 10 mg via ORAL
  Filled 2023-01-04 (×2): qty 1

## 2023-01-04 MED ORDER — LACTATED RINGERS IV BOLUS
1000.0000 mL | Freq: Once | INTRAVENOUS | Status: AC
  Administered 2023-01-04: 1000 mL via INTRAVENOUS

## 2023-01-04 MED ORDER — INSULIN ASPART 100 UNIT/ML IJ SOLN: 0.0000 [IU] | Freq: Every day | INTRAMUSCULAR | Status: DC

## 2023-01-04 MED ORDER — ONDANSETRON HCL 4 MG/2ML IJ SOLN: 4.0000 mg | Freq: Four times a day (QID) | INTRAMUSCULAR | Status: DC | PRN

## 2023-01-04 MED ORDER — GABAPENTIN 300 MG PO CAPS
600.0000 mg | ORAL_CAPSULE | Freq: Three times a day (TID) | ORAL | Status: DC
  Administered 2023-01-04 – 2023-01-05 (×4): 600 mg via ORAL
  Filled 2023-01-04 (×4): qty 2

## 2023-01-04 MED ORDER — ACETAMINOPHEN 650 MG RE SUPP: 650.0000 mg | Freq: Four times a day (QID) | RECTAL | Status: DC | PRN

## 2023-01-04 MED ORDER — ACETAMINOPHEN 325 MG PO TABS: 650.0000 mg | ORAL_TABLET | Freq: Four times a day (QID) | ORAL | Status: DC | PRN

## 2023-01-04 MED ORDER — INSULIN ASPART 100 UNIT/ML IJ SOLN: 0.0000 [IU] | Freq: Three times a day (TID) | INTRAMUSCULAR | Status: DC

## 2023-01-04 MED ORDER — SODIUM CHLORIDE 0.9% FLUSH
3.0000 mL | Freq: Two times a day (BID) | INTRAVENOUS | Status: DC
  Administered 2023-01-04: 3 mL via INTRAVENOUS

## 2023-01-04 NOTE — Assessment & Plan Note (Signed)
-   Continue LT4 

## 2023-01-04 NOTE — TOC CM/SW Note (Signed)
Transition of Care [facility]) - Inpatient Brief Assessment   Patient Details  Name: Chaneka Sandman MRN: [id number] Date of Birth: [birth year]  [facility] CM/SW Contact:    Epifanio Lesches, RN Phone Number: 01/04/2023, 7:27 AM   Clinical Narrative: Pt presents from home with son found down in bedroom after a fall. Hx of falls, HTN,glaucoma/blindness in the right eye, PE/Coumadin, diabetes, sleep apnea, lymphedema of her right arm. Pt high risk for falls. Uses walker with ambulation.  NCM to f/u with obtaining order for PT/OT evaluation from  MD.   [facility] team monitoring and will assist with needs....  Transition of Care Asessment: Insurance and Status: Insurance coverage has been reviewed Patient has primary care physician: Yes Home environment has been reviewed: From home with son Prior level of function:: Independent with ADL's, uses walker with ambulation Prior/Current Home Services: No current home services Social Determinants of Health Reivew: SDOH reviewed no interventions necessary Readmission risk has been reviewed: No Transition of care needs: transition of care needs identified, TOC will continue to follow

## 2023-01-04 NOTE — Assessment & Plan Note (Signed)
Orthostasis is the obvious cause of her fall.  Not infection or other metabolic causes. TSH normal.  B12 def possible, but less likely than overdiuresis + too many psychoactive medicines - IVF - Hold furosemide and spironolactone - Hold gabapentin, baclofen - Should reconsider sedating medicines such as temazepam, hydrocodone as outpatient - Check B12, vitamin D given hx [id number] - PCP could consider checking fat soluble vitamins A, E as well as thiamine in outpatient setting

## 2023-01-04 NOTE — ED Provider Notes (Signed)
[place] [facility]PARTMENT AT [facility] Provider Note   CSN: [id number] Arrival date & time: 01/04/23  0045     History  Chief Complaint  Patient presents with   Kelli Martinez    Kelli Martinez is a 77 y.o. female.   patient with a history of hypertension, glaucoma with blindness in the right eye, PE on Coumadin, diabetes, sleep apnea, lymphedema of her right arm presenting with fall.  She does not recall the details.  EMS reports they found her face down in her bedroom after falling and striking her head on a space heater.  Unknown if she lost consciousness.  Patient normally walks with a walker.  States she has been feeling dizzy all day and having episodes of almost falling to grab onto objects.  States she was trying to go to bed tonight when she lost her balance and fell forward striking her head.  Does not recall falling does not know what she hit her head on.  States she felt somewhat dizzy and lightheaded throughout the day today.  Complains of pain to her left ribs and left shoulder.  Has back pain which is chronic and unchanged.  Denies any head pain, neck pain, chest pain or abdominal pain.  Most of her pain is to her left ribs and her left shoulder.  No midline neck pain. No focal weakness, numbness or tingling.  No bowel or bladder incontinence.  No known seizure activity. States she has similar episode yesterday to when she felt generally weak like she was going to pass out but did not fall or hit her head at that time.  Son at bedside states she gets these episodes frequently.  The history is provided by the patient.  Fall Associated symptoms include headaches. Pertinent negatives include no chest pain and no shortness of breath.       Home Medications Prior to Admission medications   Medication Sig Start Date End Date Taking? Authorizing Provider  acarbose (PRECOSE) 50 MG tablet 1 tablet at start of breakfast and lunch and at dinnertime 05/09/22    [name], MD  albuterol [facility] HFA) 108 (90 Base) MCG/ACT inhaler Inhale 2 puffs into the lungs every 6 (six) hours as needed for wheezing or shortness of breath. 09/14/22   Waymon Budge, MD  baclofen (LIORESAL) 20 MG tablet TAKE 1 TABLET THREE TIMES A DAY (UPDATED PRESCRIPTION) 05/03/22   Deeann Saint, MD  Blood Glucose Monitoring Suppl (FREESTYLE FREEDOM LITE) w/Device KIT Use as directed to check blood sugar once daily. 02/20/19   [name], MD  Calcium Carbonate-Vitamin D 600-400 MG-UNIT tablet Take 1 tablet by mouth daily.    [provider]  clobetasol cream (TEMOVATE) 0.05 % APPLY 1 APPLICATION TOPICALLY TWICE A DAY 12/01/21   Deeann Saint, MD  Elastic Bandages & Supports (TRUFORM ARM SLEEVE L 15-20MMHG) MISC 1 Device by Does not apply route as directed. 10/05/22   Deeann Saint, MD  fluticasone (FLONASE) 50 MCG/ACT nasal spray USE 2 SPRAYS IN EACH NOSTRIL DAILY 11/20/22   Jetty Duhamel D, MD  fluticasone-salmeterol (ADVAIR DISKUS) 100-50 MCG/ACT AEPB USE 1 INHALATION EVERY 12 HOURS, RINSE MOUTH AFTER USING 09/14/22   Jetty Duhamel D, MD  FREESTYLE LITE test strip USE AS INSTRUCTED TO CHECK BLOOD SUGAR ONCE DAILY 08/09/22   [name], MD  furosemide (LASIX) 80 MG tablet TAKE 1 TABLET TWICE A DAY 09/06/22   Maisie Fus, MD  gabapentin (NEURONTIN) 600 MG tablet  TAKE 1 TABLET THREE TIMES A DAY (DOSE CHANGE) 05/03/22   Deeann Saint, MD  HYDROcodone-acetaminophen (NORCO) 5-325 MG tablet Take 1 tablet by mouth 2 (two) times daily as needed for moderate pain. Patient taking differently: Take 0.5-1 tablets by mouth 3 (three) times daily as needed for moderate pain or severe pain. 04/12/18   [name], MD  Lancets (FREESTYLE) lancets USE AS INSTRUCTED TO CHECK BLOOD SUGAR ONCE DAILY 09/23/21   [name], MD  levothyroxine (SYNTHROID) 100 MCG tablet TAKE 1 TABLET DAILY BEFORE BREAKFAST (NEED WHEN 88 MCG OUT) 07/07/22   [name], MD  Magnesium Cl-Calcium Carbonate  (SLOW-MAG PO) Take 500 mg by mouth at bedtime.     [provider]  methylcellulose (ARTIFICIAL TEARS) 1 % ophthalmic solution Place 1 drop into both eyes as needed. Dry eyes    [provider]  omeprazole (PRILOSEC) 40 MG capsule Take 1 capsule (40 mg total) by mouth daily. 07/19/22   Imogene Burn, MD  potassium chloride SA (KLOR-CON M) 20 MEQ tablet Take 2 tablets (40 mEq total) by mouth 2 (two) times daily. 09/07/22   Deeann Saint, MD  pravastatin (PRAVACHOL) 10 MG tablet TAKE 1 TABLET DAILY 12/28/22   Deeann Saint, MD  spironolactone (ALDACTONE) 100 MG tablet TAKE 1 TABLET DAILY 05/02/22   Maisie Fus, MD  temazepam (RESTORIL) 30 MG capsule 1-2 for sleep as needed 09/14/22   Jetty Duhamel D, MD  ursodiol (ACTIGALL) 300 MG capsule Take 2 capsules (600 mg total) by mouth 2 (two) times daily. 07/19/22   Imogene Burn, MD  vitamin B-12 (CYANOCOBALAMIN) 500 MCG tablet Take 500 mcg by mouth every other day.    [provider]  Vitamin D, Ergocalciferol, 50000 units CAPS Take 1 capsule by mouth once a week. 05/09/22   [name], MD  warfarin (COUMADIN) 5 MG tablet TAKE 2 TABLETS DAILY EXCEPT TAKE 1 TABLET ON WEDNESDAYS AND SATURDAYS OR TAKE AS DIRECTED BY ANTICOAGULATION CLINIC 04/18/22   Deeann Saint, MD      Allergies    Adhesive [tape]    Review of Systems   Review of Systems  Constitutional:  Negative for activity change, appetite change, fatigue and fever.  HENT:  Negative for congestion and rhinorrhea.   Respiratory:  Negative for cough, chest tightness and shortness of breath.   Cardiovascular:  Negative for chest pain.  Gastrointestinal:  Negative for anal bleeding, nausea and vomiting.  Genitourinary:  Negative for dysuria and hematuria.  Musculoskeletal:  Positive for arthralgias, back pain and myalgias.  Skin:  Negative for rash.  Neurological:  Positive for dizziness, weakness, light-headedness and headaches.   all other systems are  negative except as noted in the HPI and PMH.    Physical Exam Updated Vital Signs BP 129/84   Pulse (!) 59   Temp 97.6 F (36.4 C) (Oral)   Resp 15   SpO2 100%  Physical Exam Vitals and nursing note reviewed.  Constitutional:      General: She is not in acute distress.    Appearance: She is well-developed.  HENT:     Head: Normocephalic.     Comments: Abrasion right cheek.    Mouth/Throat:     Pharynx: No oropharyngeal exudate.  Eyes:     Conjunctiva/sclera: Conjunctivae normal.     Comments: Right pupil 5 mm and reactive, left pupil 2 mm and reactive.  She states this is baseline  Neck:     Comments:  C-collar in place, no midline pain Cardiovascular:     Rate and Rhythm: Normal rate and regular rhythm.     Heart sounds: Normal heart sounds. No murmur heard. Pulmonary:     Effort: Pulmonary effort is normal. No respiratory distress.     Breath sounds: Normal breath sounds.     Comments: Tenderness left ribs, no crepitus Chest:     Chest wall: Tenderness present.  Abdominal:     Palpations: Abdomen is soft.     Tenderness: There is no abdominal tenderness. There is no guarding or rebound.  Musculoskeletal:        General: No tenderness. Normal range of motion.     Cervical back: Normal range of motion and neck supple.     Comments: Lumbar tenderness pelvis stable  Skin:    General: Skin is warm.  Neurological:     Mental Status: She is alert and oriented to person, place, and time.     Cranial Nerves: No cranial nerve deficit.     Motor: No abnormal muscle tone.     Coordination: Coordination normal.     Comments:  5/5 strength throughout. CN 2-12 intact.Equal grip strength.   Psychiatric:        Behavior: Behavior normal.     ED Results / Procedures / Treatments   Labs (all labs ordered are listed, but only abnormal results are displayed) Labs Reviewed  CBC WITH DIFFERENTIAL/PLATELET - Abnormal; Notable for the following components:      Result Value    Hemoglobin 11.6 (*)    All other components within normal limits  BASIC METABOLIC PANEL - Abnormal; Notable for the following components:   Sodium 134 (*)    Chloride 97 (*)    Glucose, Bld 124 (*)    BUN 27 (*)    Creatinine, Ser 1.71 (*)    GFR, Estimated 31 (*)    All other components within normal limits  PROTIME-INR - Abnormal; Notable for the following components:   Prothrombin Time 27.0 (*)    INR 2.5 (*)    All other components within normal limits  URINALYSIS, ROUTINE W REFLEX MICROSCOPIC - Abnormal; Notable for the following components:   Color, Urine COLORLESS (*)    All other components within normal limits  I-STAT CHEM 8, ED - Abnormal; Notable for the following components:   BUN 28 (*)    Creatinine, Ser 1.70 (*)    Glucose, Bld 124 (*)    All other components within normal limits  ETHANOL  LACTIC ACID, PLASMA  SAMPLE TO BLOOD BANK  TROPONIN I (HIGH SENSITIVITY)  TROPONIN I (HIGH SENSITIVITY)    EKG EKG Interpretation  Date/Time:  Thursday January 04 2023 01:05:39 EDT Ventricular Rate:  60 PR Interval:  81 QRS Duration: 104 QT Interval:  430 QTC Calculation: 430 R Axis:   19 Text Interpretation: Sinus rhythm Short PR interval Abnormal R-wave progression, early transition Probable left ventricular hypertrophy No significant change was found Confirmed by Glynn Octave [phone]) on 01/04/2023 1:08:08 AM  Radiology CT CHEST ABDOMEN PELVIS W CONTRAST  Result Date: 01/04/2023 CLINICAL DATA:  Polytrauma, blunt.  Fall, on blood thinners. EXAM: CT CHEST, ABDOMEN, AND PELVIS WITH CONTRAST TECHNIQUE: Multidetector CT imaging of the chest, abdomen and pelvis was performed following the standard protocol during bolus administration of intravenous contrast. RADIATION DOSE REDUCTION: This exam was performed according to the departmental dose-optimization program which includes automated exposure control, adjustment of the mA and/or kV according to patient size  and/or use of  iterative reconstruction technique. CONTRAST:  OMNIPAQUE IOHEXOL 350 MG/ML SOLN COMPARISON:  11/11/2012, 11/05/2014. FINDINGS: CT CHEST FINDINGS Cardiovascular: The heart is normal in size and there is no pericardial effusion. Multi-vessel coronary artery calcifications are noted. The aorta and pulmonary trunk are normal in caliber. Mediastinum/Nodes: No mediastinal, hilar, or axillary lymphadenopathy. Surgical clips are noted in the right axilla. The thyroid gland, trachea, and esophagus are within normal limits. There is a small hiatal hernia. Lungs/Pleura: Fibrotic changes are noted at the right lung apex. There is bronchiectasis with fibrotic changes in the anterior aspect of the right upper and middle lobes, likely related to radiation therapy changes. Atelectasis is noted bilaterally. There is a subpleural nodule in the left upper lobe measuring 3 mm, unchanged from 2016 and likely benign. No effusion or pneumothorax. Musculoskeletal: Mastectomy changes are noted on the right. Degenerative changes are present in the thoracic spine. No acute fracture. CT ABDOMEN PELVIS FINDINGS Hepatobiliary: No hepatic injury or perihepatic hematoma. Mild biliary ductal dilatation, likely related to post cholecystectomy status. Pancreas: Pancreatic atrophy. No pancreatic ductal dilatation or surrounding inflammatory changes. Spleen: No splenic injury or perisplenic hematoma. Adrenals/Urinary Tract: The adrenal glands are within normal limits. Subcentimeter hypodensities are present in the kidneys bilaterally which are too small to further characterize. A cyst is noted in the lower pole of the right kidney. No renal calculus or hydronephrosis. The bladder is unremarkable. Stomach/Bowel: Gastric surgical changes are noted. There is a small hiatal hernia. No free air or pneumatosis. A moderate amount of retained stool is present in the colon. Appendix is not seen. No evidence of bowel wall thickening, distention, or  inflammatory changes. Vascular/Lymphatic: Aortic atherosclerosis. An IVC filter is noted. No enlarged abdominal or pelvic lymph nodes. Reproductive: Status post hysterectomy. No adnexal masses. Other: No abdominopelvic ascites. Musculoskeletal: Degenerative changes are present in the lumbar spine. No acute fracture. IMPRESSION: 1. No evidence of acute fracture or solid organ injury. 2. Moderate amount of retained stool in the colon suggesting constipation. 3. Small hiatal hernia. 4. Aortic atherosclerosis and coronary artery calcifications. Electronically Signed   By: Thornell Sartorius M.D.   On: 01/04/2023 02:03   CT ANGIO HEAD NECK W WO CM  Result Date: 01/04/2023 CLINICAL DATA:  Fall while on blood thinners EXAM: CT ANGIOGRAPHY HEAD AND NECK WITH AND WITHOUT CONTRAST TECHNIQUE: Multidetector CT imaging of the head and neck was performed using the standard protocol during bolus administration of intravenous contrast. Multiplanar CT image reconstructions and MIPs were obtained to evaluate the vascular anatomy. Carotid stenosis measurements (when applicable) are obtained utilizing NASCET criteria, using the distal internal carotid diameter as the denominator. RADIATION DOSE REDUCTION: This exam was performed according to the departmental dose-optimization program which includes automated exposure control, adjustment of the mA and/or kV according to patient size and/or use of iterative reconstruction technique. CONTRAST:  OMNIPAQUE IOHEXOL 350 MG/ML SOLN COMPARISON:  None Available. FINDINGS: CTA NECK FINDINGS SKELETON: There is no bony spinal canal stenosis. No lytic or blastic lesion. OTHER NECK: Normal pharynx, larynx and major salivary glands. No cervical lymphadenopathy. Unremarkable thyroid gland. UPPER CHEST: Scarring in the anterior apex of the right upper lobe. AORTIC ARCH: There is no calcific atherosclerosis of the aortic arch. There is no aneurysm, dissection or hemodynamically significant stenosis  of the visualized portion of the aorta. Conventional 3 vessel aortic branching pattern. The visualized proximal subclavian arteries are widely patent. RIGHT CAROTID SYSTEM: No dissection, occlusion or aneurysm. Mild atherosclerotic calcification  at the carotid bifurcation without hemodynamically significant stenosis. LEFT CAROTID SYSTEM: No dissection, occlusion or aneurysm. Mild atherosclerotic calcification at the carotid bifurcation without hemodynamically significant stenosis. VERTEBRAL ARTERIES: Codominant configuration. Both origins are clearly patent. There is no dissection, occlusion or flow-limiting stenosis to the skull base (V1-V3 segments). CTA HEAD FINDINGS POSTERIOR CIRCULATION: --Vertebral arteries: Normal V4 segments. --Inferior cerebellar arteries: Normal. --Basilar artery: Normal. --Superior cerebellar arteries: Normal. --Posterior cerebral arteries (PCA): Normal. ANTERIOR CIRCULATION: --Intracranial internal carotid arteries: Atherosclerotic calcification of the internal carotid arteries at the skull base without hemodynamically significant stenosis. --Anterior cerebral arteries (ACA): Normal. Both A1 segments are present. Patent anterior communicating artery (a-comm). --Middle cerebral arteries (MCA): Normal. VENOUS SINUSES: As permitted by contrast timing, patent. ANATOMIC VARIANTS: None Review of the MIP images confirms the above findings. IMPRESSION: 1. No emergent large vessel occlusion or hemodynamically significant stenosis of the head or neck. 2. Mild bilateral carotid bifurcation atherosclerosis without hemodynamically significant stenosis. Electronically Signed   By: Deatra Robinson M.D.   On: 01/04/2023 01:58   CT HEAD WO CONTRAST  Result Date: 01/04/2023 CLINICAL DATA:  Recent fall with headaches and neck pain, initial encounter EXAM: CT HEAD WITHOUT CONTRAST CT MAXILLOFACIAL WITHOUT CONTRAST CT CERVICAL SPINE WITHOUT CONTRAST TECHNIQUE: Multidetector CT imaging of the head, cervical  spine, and maxillofacial structures were performed using the standard protocol without intravenous contrast. Multiplanar CT image reconstructions of the cervical spine and maxillofacial structures were also generated. RADIATION DOSE REDUCTION: This exam was performed according to the departmental dose-optimization program which includes automated exposure control, adjustment of the mA and/or kV according to patient size and/or use of iterative reconstruction technique. COMPARISON:  07/12/2021 FINDINGS: CT HEAD FINDINGS Brain: No evidence of acute infarction, hemorrhage, hydrocephalus, extra-axial collection or mass lesion/mass effect. Mild chronic white matter ischemic changes are noted. Vascular: No hyperdense vessel or unexpected calcification. Skull: Normal. Negative for fracture or focal lesion. Other: None CT MAXILLOFACIAL FINDINGS Osseous: No fracture or mandibular dislocation. No destructive process. Orbits: Orbits and their contents are within normal limits. Sinuses: Paranasal sinuses demonstrate mucosal thickening within the maxillary antra bilaterally. Additionally small air-fluid levels are noted which may be related to the recent injury. Soft tissues: Surrounding soft tissue structures are within normal limits. CT CERVICAL SPINE FINDINGS Alignment: Within normal limits. Skull base and vertebrae: 7 cervical segments are well visualized. Vertebral body height is well maintained. No acute fracture or acute facet abnormality is noted. Multilevel osteophytic changes are seen. Soft tissues and spinal canal: Surrounding soft tissue structures are within normal limits Upper chest: Visualized lung apices demonstrate mild apical scarring on the right. Other: None IMPRESSION: CT of the head: Mild chronic white matter ischemic changes are noted. No acute abnormality seen. CT of the maxillofacial bones: Mucosal thickening and air-fluid levels within the maxillary antra bilaterally. No acute bony abnormality is seen.  CT of the cervical spine: Degenerative changes without acute abnormality. Electronically Signed   By: Alcide Clever M.D.   On: 01/04/2023 01:52   CT MAXILLOFACIAL WO CONTRAST  Result Date: 01/04/2023 CLINICAL DATA:  Recent fall with headaches and neck pain, initial encounter EXAM: CT HEAD WITHOUT CONTRAST CT MAXILLOFACIAL WITHOUT CONTRAST CT CERVICAL SPINE WITHOUT CONTRAST TECHNIQUE: Multidetector CT imaging of the head, cervical spine, and maxillofacial structures were performed using the standard protocol without intravenous contrast. Multiplanar CT image reconstructions of the cervical spine and maxillofacial structures were also generated. RADIATION DOSE REDUCTION: This exam was performed according to the departmental dose-optimization program which includes automated  exposure control, adjustment of the mA and/or kV according to patient size and/or use of iterative reconstruction technique. COMPARISON:  07/12/2021 FINDINGS: CT HEAD FINDINGS Brain: No evidence of acute infarction, hemorrhage, hydrocephalus, extra-axial collection or mass lesion/mass effect. Mild chronic white matter ischemic changes are noted. Vascular: No hyperdense vessel or unexpected calcification. Skull: Normal. Negative for fracture or focal lesion. Other: None CT MAXILLOFACIAL FINDINGS Osseous: No fracture or mandibular dislocation. No destructive process. Orbits: Orbits and their contents are within normal limits. Sinuses: Paranasal sinuses demonstrate mucosal thickening within the maxillary antra bilaterally. Additionally small air-fluid levels are noted which may be related to the recent injury. Soft tissues: Surrounding soft tissue structures are within normal limits. CT CERVICAL SPINE FINDINGS Alignment: Within normal limits. Skull base and vertebrae: 7 cervical segments are well visualized. Vertebral body height is well maintained. No acute fracture or acute facet abnormality is noted. Multilevel osteophytic changes are seen. Soft  tissues and spinal canal: Surrounding soft tissue structures are within normal limits Upper chest: Visualized lung apices demonstrate mild apical scarring on the right. Other: None IMPRESSION: CT of the head: Mild chronic white matter ischemic changes are noted. No acute abnormality seen. CT of the maxillofacial bones: Mucosal thickening and air-fluid levels within the maxillary antra bilaterally. No acute bony abnormality is seen. CT of the cervical spine: Degenerative changes without acute abnormality. Electronically Signed   By: Alcide Clever M.D.   On: 01/04/2023 01:52   CT C-SPINE NO CHARGE  Result Date: 01/04/2023 CLINICAL DATA:  Recent fall with headaches and neck pain, initial encounter EXAM: CT HEAD WITHOUT CONTRAST CT MAXILLOFACIAL WITHOUT CONTRAST CT CERVICAL SPINE WITHOUT CONTRAST TECHNIQUE: Multidetector CT imaging of the head, cervical spine, and maxillofacial structures were performed using the standard protocol without intravenous contrast. Multiplanar CT image reconstructions of the cervical spine and maxillofacial structures were also generated. RADIATION DOSE REDUCTION: This exam was performed according to the departmental dose-optimization program which includes automated exposure control, adjustment of the mA and/or kV according to patient size and/or use of iterative reconstruction technique. COMPARISON:  07/12/2021 FINDINGS: CT HEAD FINDINGS Brain: No evidence of acute infarction, hemorrhage, hydrocephalus, extra-axial collection or mass lesion/mass effect. Mild chronic white matter ischemic changes are noted. Vascular: No hyperdense vessel or unexpected calcification. Skull: Normal. Negative for fracture or focal lesion. Other: None CT MAXILLOFACIAL FINDINGS Osseous: No fracture or mandibular dislocation. No destructive process. Orbits: Orbits and their contents are within normal limits. Sinuses: Paranasal sinuses demonstrate mucosal thickening within the maxillary antra bilaterally.  Additionally small air-fluid levels are noted which may be related to the recent injury. Soft tissues: Surrounding soft tissue structures are within normal limits. CT CERVICAL SPINE FINDINGS Alignment: Within normal limits. Skull base and vertebrae: 7 cervical segments are well visualized. Vertebral body height is well maintained. No acute fracture or acute facet abnormality is noted. Multilevel osteophytic changes are seen. Soft tissues and spinal canal: Surrounding soft tissue structures are within normal limits Upper chest: Visualized lung apices demonstrate mild apical scarring on the right. Other: None IMPRESSION: CT of the head: Mild chronic white matter ischemic changes are noted. No acute abnormality seen. CT of the maxillofacial bones: Mucosal thickening and air-fluid levels within the maxillary antra bilaterally. No acute bony abnormality is seen. CT of the cervical spine: Degenerative changes without acute abnormality. Electronically Signed   By: Alcide Clever M.D.   On: 01/04/2023 01:52   DG Shoulder Left Portable  Result Date: 01/04/2023 CLINICAL DATA:  Recent fall with left  shoulder pain, initial encounter EXAM: LEFT SHOULDER COMPARISON:  None Available. FINDINGS: Mild degenerative changes of the acromioclavicular joint are seen. No acute fracture or dislocation is noted. No soft tissue abnormality is seen. Degenerative changes of the glenohumeral joint are seen as well. IMPRESSION: Degenerative change without acute abnormality. Electronically Signed   By: Alcide Clever M.D.   On: 01/04/2023 01:19   DG Chest Portable 1 View  Result Date: 01/04/2023 CLINICAL DATA:  Recent fall with head injury, initial encounter EXAM: PORTABLE CHEST 1 VIEW COMPARISON:  10/05/2022 FINDINGS: Cardiac shadow is enlarged but stable. Lungs are well aerated bilaterally. No focal infiltrate is seen. Mild central vascular congestion is noted. Postsurgical changes in the right axilla are seen. IMPRESSION: Mild vascular  congestion without edema. Electronically Signed   By: Alcide Clever M.D.   On: 01/04/2023 01:19   DG Pelvis Portable  Result Date: 01/04/2023 CLINICAL DATA:  Recent fall with pelvic pain, initial encounter EXAM: PORTABLE PELVIS 1 VIEWS COMPARISON:  None Available. FINDINGS: Pelvic ring is intact. No acute fracture or dislocation is seen. No soft tissue abnormality is noted. IMPRESSION: No acute abnormality noted. Electronically Signed   By: Alcide Clever M.D.   On: 01/04/2023 01:18    Procedures Procedures    Medications Ordered in ED Medications - No data to display  ED Course/ Medical Decision Making/ A&P                             Medical Decision Making Amount and/or Complexity of Data Reviewed Independent Historian: EMS Labs: ordered. Decision-making details documented in ED Course. Radiology: ordered and independent interpretation performed. Decision-making details documented in ED Course. ECG/medicine tests: ordered and independent interpretation performed. Decision-making details documented in ED Course.  Risk Prescription drug management. Decision regarding hospitalization.   Fall with possible loss of consciousness.  Complains of pain to her left shoulder and back.  As well as left ribs.  Vitals are stable, no distress.  EKG is sinus rhythm without Brugada or prolonged QT  Chest x-ray is negative for rib fracture or pneumothorax.  Pelvis x-ray is negative.  Left shoulder x-ray is negative.  Results reviewed interpreted by me.  Given patient's left-sided chest pain, as well as head injury on Coumadin, CT trauma scans were obtained.  No intracranial hemorrhage.  No facial bone fracture.  No intrathoracic or intra-abdominal injury.  Syncopal episode without prodrome.  Patient describes episode of feeling generally weak and lightheaded.  States she was lying across the bed today and felt generally weak when she stood up.  Creatinine slightly elevated 1.7 from 1.0.  She does  appear slightly dehydrated.  Is orthostatic as well.  IV fluids given.  Blood pressure 140 systolic with lying drops to 110 systolic with standing.  Heart rate remained stable.  Patient complains of pain to her left shoulder, left ribs and low back.  Her traumatic imaging is negative as above.  Given syncope without prodrome as well as recurrent episode of generalized weakness with near syncope over the past several days will plan admission.  Discussed with Dr. Antionette Char       Final Clinical Impression(s) / ED Diagnoses Final diagnoses:  Fall, initial encounter  Recurrent syncope    Rx / DC Orders ED Discharge Orders     None         Wilba Mutz, Jeannett Senior, MD 01/04/23 [phone]

## 2023-01-04 NOTE — Assessment & Plan Note (Signed)
-   Continue home ICS/LABA °

## 2023-01-04 NOTE — Progress Notes (Signed)
ANTICOAGULATION CONSULT NOTE - Initial Consult  Pharmacy Consult for warfarin Indication: history of pulmonary embolus  Allergies  Allergen Reactions   Adhesive [Tape] Rash    Patient Measurements:   Heparin Dosing Weight:   Vital Signs: Temp: 98 F (36.7 C) (06/06 0538) Temp Source: Oral (06/06 0538) BP: 123/67 (06/06 0500) Pulse Rate: 65 (06/06 0500)  Labs: Recent Labs    01/04/23 0108 01/04/23 0111 01/04/23 0300  HGB 12.9 11.6*  --   HCT 38.0 37.1  --   PLT  --  248  --   LABPROT  --  27.0*  --   INR  --  2.5*  --   CREATININE 1.70* 1.71*  --   TROPONINIHS  --  8 8    CrCl cannot be calculated (Unknown ideal weight.).   Medical History: Past Medical History:  Diagnosis Date   Allergy    Anemia    Arthritis    Asthma    Back pain, chronic    GETS INJECTIONS IN BACK   Blind right eye    hemorrhage   Blood transfusion    Cancer (HCC)    breast 1994   Clotting disorder (HCC)    Diabetes mellitus without complication (HCC)    takes precose   Elevated liver enzymes    Gallstones    GERD (gastroesophageal reflux disease)    HX: breast cancer    Hyperlipidemia    Hypertension    Hypothyroid    Lymphedema of arm    RT   Morbid obesity (HCC)    OSA (obstructive sleep apnea)    has not used in 2 years-lost weight   Osteoporosis    Peripheral neuropathy    on gabapentin   PONV (postoperative nausea and vomiting)    Psoriasis    Pulmonary embolism (HCC) 1998 / 1994 /1968   Syncope and collapse 2011   due to anemia    Medications:  Scheduled:   sodium chloride flush  3 mL Intravenous Q12H    Assessment: 77 yo female admitted after a fall. Patient was found down after falling and hitting her head on a space heater. CT head negative for hemorrhage. Patient is on warfarin PTA for hx of PE. Discussed with patient and her current dosing regimen is warfarin 10mg  daily except for warfarin 5mg  on Wednesdays and Saturdays. Last dose of warfarin was  01/03/23.  INR on admission therapeutic at 2.5. CBC stable/wnl.   Goal of Therapy:  INR 2-3 Monitor platelets by anticoagulation protocol: Yes   Plan:  Warfarin 10 mg PO x1 this evening Daily INR, CBC  Rexford Maus, PharmD, BCPS 01/04/2023 7:52 AM

## 2023-01-04 NOTE — ED Notes (Signed)
Pt headed to CT, Trauma RN Ryan at bedside.

## 2023-01-04 NOTE — Hospital Course (Signed)
Kelli Martinez is a 77 y.o. 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    I see this is something her PCP has been addressing in the outpatient setting excellently by reducing her diuretics, but as would be expected in a 77 y.o. with obesity and a medicine list >10 medicines (including temazepam, gabapentin, baclofen, and hydrocodone), she is still at high risk for dizziness, weakness and falls.   Here, TSH normal.  Cr up to 1.7 (from baseline 1-1.2).   No signs or symptoms of infection, hypercarbia, heart failure, stroke, seizure.

## 2023-01-04 NOTE — Assessment & Plan Note (Signed)
Continue warfarin.

## 2023-01-04 NOTE — Progress Notes (Signed)
Code Trauma. Pt fell and is on blood thinner. No family by bedside.

## 2023-01-04 NOTE — Assessment & Plan Note (Signed)
-   Hold lasix, spiro

## 2023-01-04 NOTE — ED Notes (Signed)
Trauma Response Nurse Documentation   Kelli Martinez is a 77 y.o. female arriving to [facility] ED via [facility]  On warfarin daily. Trauma was activated as a Level 2 by Rande Brunt based on the following trauma criteria Elderly patients > 65 with head trauma on anti-coagulation (excluding ASA).  Patient cleared for CT by Dr. Manus Gunning. Pt transported to CT with trauma response nurse present to monitor. RN remained with the patient throughout their absence from the department for clinical observation.   GCS 15.  History   Past Medical History:  Diagnosis Date   Allergy    Anemia    Arthritis    Asthma    Back pain, chronic    GETS INJECTIONS IN BACK   Blind right eye    hemorrhage   Blood transfusion    Cancer (HCC)    breast 1994   Clotting disorder (HCC)    Diabetes mellitus without complication (HCC)    takes precose   Elevated liver enzymes    Gallstones    GERD (gastroesophageal reflux disease)    HX: breast cancer    Hyperlipidemia    Hypertension    Hypothyroid    Lymphedema of arm    RT   Morbid obesity (HCC)    OSA (obstructive sleep apnea)    has not used in 2 years-lost weight   Osteoporosis    Peripheral neuropathy    on gabapentin   PONV (postoperative nausea and vomiting)    Psoriasis    Pulmonary embolism (HCC) 1998 / 1994 /1968   Syncope and collapse 2011   due to anemia     Past Surgical History:  Procedure Laterality Date   ABDOMINAL HYSTERECTOMY     BONE MARROW TRANSPLANT  1994   CARDIOVASCULAR STRESS TEST  05/23/2005   EF 53%   carpal tunnell     bil   cataracts     CHOLECYSTECTOMY  05/10/2012   Procedure: LAPAROSCOPIC CHOLECYSTECTOMY WITH INTRAOPERATIVE CHOLANGIOGRAM;  Surgeon: Valarie Merino, MD;  Location: [facility];  Service: General;  Laterality: N/A;  Laparoscopic Cholecystectomy with Intraoperative Cholangiogram   GASTRIC BYPASS  2011   bariatric surgery   HIATAL HERNIA REPAIR     IVC filter     recurrent DVT    KNEE ARTHROTOMY  1998   MASS EXCISION Right 11/24/2016   Procedure: EXCISION RIGHT AXILLARY LESION AND CHEST WALL LESION;  Surgeon: Luretha Murphy, MD;  Location: [facility];  Service: General;  Laterality: Right;   MASTECTOMY     Right   US ECHOCARDIOGRAPHY  10/27/08   EF 55-60%       Initial Focused Assessment (If applicable, or please see trauma documentation): Airway-- intact, no visible obstruction Breathing-- spontaeous, unlabored Circulation-- no obvious bleeding noted  CT's Completed:   CT Head, CT C-Spine, CT Chest w/ contrast, CT abdomen/pelvis w/ contrast, and CT Angio Head   Interventions:  See event summary  Plan for disposition:  Unknown at this time.  Consults completed:  none at 0257.  Event Summary: Patient brought in by [facility] from home. Patient had a fall today at an unknown time. Patient unsure if she lost consciousness. Patient complaining of left shoulder pain and left sided rib and abdomen pain. On arrival manual BP obtained. 20 G PIV R wrist and lab work obtained. Xray chest, pelvis, left shoulder completed. CT head, maxillofacial, chest/abdomen/pelvis completed.  MTP Summary (If applicable):  N/A  Bedside handoff with ED RN  Louis Matte  Trauma Response RN  Please call TRN at [phone] for further assistance.

## 2023-01-04 NOTE — H&P (Signed)
History and Physical    Patient: Kelli Martinez NWG:[id number] DOB: 07/30/1946 DOA: 01/04/2023 DOS: the patient was seen and examined on 01/04/2023 PCP: Deeann Saint, MD  Patient coming from: Home  Chief Complaint:  Chief Complaint  Patient presents with   Fall   HPI: Kelli Martinez is a 77 y.o. female with medical history significant of recurrent DVT/PE, diabetes mellitus, chronic lower extremity cramping, asthma, dyslipidemia and GERD.  Patient also hit as a history of severe lower extremity venous insufficiency causing severe edema.  She is also had a history of breast cancer and has right upper extremity lymphedema.  Patient presented to the ED after experiencing a ground-level fall.  She hit her head on a space heater.  Typically walks with a walker.  She was maintaining sinus rhythm.  She was normotensive.  Heart rate 60.  O2 sats 100%.  Later orthostatic vital signs were checked and these were positive and her supine blood pressure was 144/89, sitting was 132/66 and standing was 110/81.  Patient was given a liter of IV fluid in the ED.  Imaging revealed no acute bony injuries.  CT of the head showed no acute abnormalities.  CT of the maxillofacial bones also showed no acute bony injuries as did the CT of the cervical spine.  CT of the chest abdomen and pelvis showed no evidence of acute fracture or solid organ injury.  She did have an incidental finding of moderate amount of retained stool in the colon suggestive of constipation.  CT angio head and neck showed no emergent large vessel occlusion or hemodynamically significant stenosis of the head or neck.  She was found to have an abrasion on the right side of her face.  She was having significant left lower rib cage discomfort especially with palpation.  Upon further discussion with the patient she has been having ongoing issues that are intermittent in nature with dizziness fatigue and weakness and stumbling this has  been occurring for several months.  She has had recent issues with mild acute kidney injury and her doctors have continued to down titrate her Lasix.  Initially she was on 80 mg twice daily.  Because of her normal renal function the dose was decreased to 80 mg during the day and 40 mg in the evening.  Her renal function recovered.  At presentation to the ED her creatinine had increased from a baseline of 1.15 to1.7.  Hospitalist service was asked to evaluate the patient for admission.     Review of Systems: As mentioned in the history of present illness. All other systems reviewed and are negative. Past Medical History:  Diagnosis Date   Allergy    Anemia    Arthritis    Asthma    Back pain, chronic    GETS INJECTIONS IN BACK   Blind right eye    hemorrhage   Blood transfusion    Cancer (HCC)    breast 1994   Clotting disorder (HCC)    Diabetes mellitus without complication (HCC)    takes precose   Elevated liver enzymes    Gallstones    GERD (gastroesophageal reflux disease)    HX: breast cancer    Hyperlipidemia    Hypertension    Hypothyroid    Lymphedema of arm    RT   Morbid obesity (HCC)    OSA (obstructive sleep apnea)    has not used in 2 years-lost weight   Osteoporosis    Peripheral neuropathy  on gabapentin   PONV (postoperative nausea and vomiting)    Psoriasis    Pulmonary embolism (HCC) 1998 / 1994 /1968   Syncope and collapse 2011   due to anemia   Past Surgical History:  Procedure Laterality Date   ABDOMINAL HYSTERECTOMY     BONE MARROW TRANSPLANT  1994   CARDIOVASCULAR STRESS TEST  05/23/2005   EF 53%   carpal tunnell     bil   cataracts     CHOLECYSTECTOMY  05/10/2012   Procedure: LAPAROSCOPIC CHOLECYSTECTOMY WITH INTRAOPERATIVE CHOLANGIOGRAM;  Surgeon: Valarie Merino, MD;  Location: [facility];  Service: General;  Laterality: N/A;  Laparoscopic Cholecystectomy with Intraoperative Cholangiogram   GASTRIC BYPASS  2011   bariatric surgery    HIATAL HERNIA REPAIR     IVC filter     recurrent DVT   KNEE ARTHROTOMY  1998   MASS EXCISION Right 11/24/2016   Procedure: EXCISION RIGHT AXILLARY LESION AND CHEST WALL LESION;  Surgeon: Luretha Murphy, MD;  Location: [facility];  Service: General;  Laterality: Right;   MASTECTOMY     Right   US ECHOCARDIOGRAPHY  10/27/08   EF 55-60%   Social History:  reports that she quit smoking about 44 years ago. Her smoking use included cigarettes. She has a 5.00 pack-year smoking history. She has never used smokeless tobacco. She reports that she does not drink alcohol and does not use drugs.  Allergies  Allergen Reactions   Adhesive [Tape] Rash    Family History  Problem Relation Age of Onset   Sudden death Mother        car accident   Cancer Father 44       lung   Cancer Sister 30       lung cancer   Asthma Daughter    Colon cancer Neg Hx    Stomach cancer Neg Hx    Esophageal cancer Neg Hx    Colon polyps Neg Hx     Prior to Admission medications   Medication Sig Start Date End Date Taking? Authorizing Provider  acarbose (PRECOSE) 50 MG tablet 1 tablet at start of breakfast and lunch and at dinnertime 05/09/22   [name], MD  albuterol [facility] HFA) 108 (90 Base) MCG/ACT inhaler Inhale 2 puffs into the lungs every 6 (six) hours as needed for wheezing or shortness of breath. 09/14/22   Waymon Budge, MD  baclofen (LIORESAL) 20 MG tablet TAKE 1 TABLET THREE TIMES A DAY (UPDATED PRESCRIPTION) 05/03/22   Deeann Saint, MD  Blood Glucose Monitoring Suppl (FREESTYLE FREEDOM LITE) w/Device KIT Use as directed to check blood sugar once daily. 02/20/19   [name], MD  Calcium Carbonate-Vitamin D 600-400 MG-UNIT tablet Take 1 tablet by mouth daily.    [provider]  clobetasol cream (TEMOVATE) 0.05 % APPLY 1 APPLICATION TOPICALLY TWICE A DAY 12/01/21   Deeann Saint, MD  Elastic Bandages & Supports (TRUFORM ARM SLEEVE L 15-20MMHG) MISC 1 Device by Does not  apply route as directed. 10/05/22   Deeann Saint, MD  fluticasone (FLONASE) 50 MCG/ACT nasal spray USE 2 SPRAYS IN EACH NOSTRIL DAILY 11/20/22   Jetty Duhamel D, MD  fluticasone-salmeterol (ADVAIR DISKUS) 100-50 MCG/ACT AEPB USE 1 INHALATION EVERY 12 HOURS, RINSE MOUTH AFTER USING 09/14/22   Jetty Duhamel D, MD  FREESTYLE LITE test strip USE AS INSTRUCTED TO CHECK BLOOD SUGAR ONCE DAILY 08/09/22   [name], MD  furosemide (LASIX) 80 MG tablet  TAKE 1 TABLET TWICE A DAY 09/06/22   Maisie Fus, MD  gabapentin (NEURONTIN) 600 MG tablet TAKE 1 TABLET THREE TIMES A DAY (DOSE CHANGE) 05/03/22   Deeann Saint, MD  HYDROcodone-acetaminophen (NORCO) 5-325 MG tablet Take 1 tablet by mouth 2 (two) times daily as needed for moderate pain. Patient taking differently: Take 0.5-1 tablets by mouth 3 (three) times daily as needed for moderate pain or severe pain. 04/12/18   [name], MD  Lancets (FREESTYLE) lancets USE AS INSTRUCTED TO CHECK BLOOD SUGAR ONCE DAILY 09/23/21   [name], MD  levothyroxine (SYNTHROID) 100 MCG tablet TAKE 1 TABLET DAILY BEFORE BREAKFAST (NEED WHEN 88 MCG OUT) 07/07/22   [name], MD  Magnesium Cl-Calcium Carbonate (SLOW-MAG PO) Take 500 mg by mouth at bedtime.     [provider]  methylcellulose (ARTIFICIAL TEARS) 1 % ophthalmic solution Place 1 drop into both eyes as needed. Dry eyes    [provider]  omeprazole (PRILOSEC) 40 MG capsule Take 1 capsule (40 mg total) by mouth daily. 07/19/22   Imogene Burn, MD  potassium chloride SA (KLOR-CON M) 20 MEQ tablet Take 2 tablets (40 mEq total) by mouth 2 (two) times daily. 09/07/22   Deeann Saint, MD  pravastatin (PRAVACHOL) 10 MG tablet TAKE 1 TABLET DAILY 12/28/22   Deeann Saint, MD  spironolactone (ALDACTONE) 100 MG tablet TAKE 1 TABLET DAILY 05/02/22   Maisie Fus, MD  temazepam (RESTORIL) 30 MG capsule 1-2 for sleep as needed 09/14/22   Jetty Duhamel D, MD  ursodiol (ACTIGALL) 300 MG capsule Take  2 capsules (600 mg total) by mouth 2 (two) times daily. 07/19/22   Imogene Burn, MD  vitamin B-12 (CYANOCOBALAMIN) 500 MCG tablet Take 500 mcg by mouth every other day.    [provider]  Vitamin D, Ergocalciferol, 50000 units CAPS Take 1 capsule by mouth once a week. 05/09/22   [name], MD  warfarin (COUMADIN) 5 MG tablet TAKE 2 TABLETS DAILY EXCEPT TAKE 1 TABLET ON WEDNESDAYS AND SATURDAYS OR TAKE AS DIRECTED BY ANTICOAGULATION CLINIC 04/18/22   Deeann Saint, MD    Physical Exam: Vitals:   01/04/23 0330 01/04/23 0400 01/04/23 0500 01/04/23 0538  BP: 134/62 120/67 123/67   Pulse: 65 69 65   Resp: 14 11 15    Temp:    98 F (36.7 C)  TempSrc:    Oral  SpO2: 100% 100% 100%    Constitutional: NAD, calm, uncomfortable with palpation of the left lower rib cage as well as her lower extremities Respiratory: clear to auscultation bilaterally, no wheezing, no crackles. Normal respiratory effort. No accessory muscle use.  Patient very tender with palpation over the left lower lateral rib cage region. Cardiovascular: Regular rate and rhythm, no murmurs / rubs / gallops.  Chronic but stable bilateral lower extremity edema 1+. 2+ pedal pulses.  Abdomen: no tenderness, no masses palpated. No hepatosplenomegaly. Bowel sounds positive.  Musculoskeletal: no clubbing / cyanosis. No joint deformity upper and lower extremities. Good ROM, no contractures. Normal muscle tone.  Skin: no rashes, lesions, ulcers. No induration, patient with bruising over right cheekbone region Neurologic: CN 2-12 grossly intact. Sensation intact, DTR normal. Strength 5/5 x all 4 extremities.  Psychiatric: Normal judgment and insight. Alert and oriented x 3. Normal mood.     Data Reviewed:  Sodium 134, potassium 4.8, chloride 97, CO2 27, glucose 124, BUN 27, creatinine 1.7, anion gap 10, GFR 31  Troponin  x 2 was 8  Lactic acid 1.3  White count 6900 with normal differential, hemoglobin 11.6, platelets  248,000  Urinalysis unremarkable  Alcohol level less than 10  Imaging as above  EKG demonstrates sinus rhythm/first-degree AV block without any acute ischemic changes or ectopy  Assessment and Plan: Syncope with positive orthostatic vital signs/fall at home Suspect etiology secondary to volume depletion from Lasix Currently we are holding Lasix dosing and will decide about final dosing regimen prior to discharge Check orthostatic vital signs in the a.m. Gentle IV fluid hydration with normal saline at 100 an hour-patient aware that during hydration her peripheral edema may worsen somewhat PT and OT evaluation.  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 Presented with INR of 2.5 Honesty managing warfarin  Diabetes mellitus Continue Precose Follow CBGs and provide sensitive SSI  Severe lower extremity venous insufficiency with chronic edema Lasix on hold as above Has associated neuropathic issues including severe cramps Continue Neurontin and Lioresal-she has been on these medications without dose changes for multiple years  Hypothyroidism Continue Synthroid  Dyslipidemia Pravachol  History of breast cancer Chronic left upper extremity lymphedema  Mild persistent allergic asthma Continue Advair      Advance Care Planning:   Code Status: Full Code   DVT prophylaxis: Warfarin  Consults: None  Family Communication: Family member at bedside  Severity of Illness: The appropriate patient status for this patient is OBSERVATION. Observation status is judged to be reasonable and necessary in order to provide the required  intensity of service to ensure the patient's safety. The patient's presenting symptoms, physical exam findings, and initial radiographic and laboratory data in the context of their medical condition is felt to place them at decreased risk for further clinical deterioration. Furthermore, it is anticipated that the patient will be medically stable for discharge from the hospital within 2 midnights of admission.   Author: Junious Silk, NP 01/04/2023 7:52 AM  For on call review [email or web address].

## 2023-01-04 NOTE — Evaluation (Signed)
Physical Therapy Evaluation Patient Details Name: Kelli Martinez MRN: [id number] DOB: October 16, 1945 Today's Date: 01/04/2023  History of Present Illness  77 y.o. female adm 6/6 with medical history significant of recurrent DVT/PE, diabetes mellitus, chronic lower extremity cramping, asthma, dyslipidemia and GERD, severe lower extremity venous insufficiency causing edema. breast cancer and has right upper extremity lymphedema.  Patient presented to the ED after experiencing a ground-level fall.  Scans are negative, but waiting for L foot XR results.  Clinical Impression  Patient had just completed orthostatic vitals with RN and she noted positive reading upon standing.  Patient relates several week history of feeling weak or intermittently dizzy on his feet.  She was previously living alone and completing ADL/IADL's though now needing assist for supine to sit and supervision for mobility due to generalized weakness and mild pain from falling.  Patient likely to not need initial PT follow up, but encouraged to ask MD if she feels otherwise at d/c.        Recommendations for follow up therapy are one component of a multi-disciplinary discharge planning process, led by the attending physician.  Recommendations may be updated based on patient status, additional functional criteria and insurance authorization.  Follow Up Recommendations       Assistance Recommended at Discharge Intermittent Supervision/Assistance  Patient can return home with the following  Direct supervision/assist for financial management;Help with stairs or ramp for entrance;Assistance with feeding;Assistance with cooking/housework;Assist for transportation    Equipment Recommendations None recommended by PT  Recommendations for Other Services       Functional Status Assessment Patient has had a recent decline in their functional status and demonstrates the ability to make significant improvements in function in a  reasonable and predictable amount of time.     Precautions / Restrictions Precautions Precautions: Fall Precaution Comments: Watch BP; blind in R eye      Mobility  Bed Mobility Overal bed mobility: Modified Independent                  Transfers Overall transfer level: Needs assistance Equipment used: Rolling walker (2 wheels) Transfers: Sit to/from Stand Sit to Stand: From elevated surface, Min guard           General transfer comment: stood from EOB at slightly higher surface and minguard for safety,    Ambulation/Gait Ambulation/Gait assistance: Min guard, Supervision Gait Distance (Feet): 30 Feet Assistive device: Rolling walker (2 wheels) Gait Pattern/deviations: Wide base of support, Trunk flexed, Step-through pattern       General Gait Details: limited distance due to pain L foot and just had x-ray.  No physical help needed and pt able to manage walker in room small spaces without difficulty  Stairs            Wheelchair Mobility    Modified Rankin (Stroke Patients Only)       Balance Overall balance assessment: Needs assistance Sitting-balance support: Feet supported Sitting balance-Leahy Scale: Good     Standing balance support: Bilateral upper extremity supported Standing balance-Leahy Scale: Poor Standing balance comment: UE reliant                             Pertinent Vitals/Pain Pain Assessment Pain Assessment: Faces Faces Pain Scale: Hurts even more Pain Location: L little toe in standing/weight bearing Pain Descriptors / Indicators: Sharp Pain Intervention(s): Monitored during session, Limited activity within patient's tolerance    Home Living Family/patient expects to  be discharged to:: Private residence Living Arrangements: Children Available Help at Discharge: Family Type of Home: House Home Access: Ramped entrance       Home Layout: One level Home Equipment: Agricultural consultant (2 wheels);Rollator (4  wheels);BSC/3in1;Adaptive equipment;Hand held shower head      Prior Function Prior Level of Function : Independent/Modified Independent             Mobility Comments: Just completed HH PT and walking with 4WRW       Hand Dominance   Dominant Hand: Right    Extremity/Trunk Assessment   Upper Extremity Assessment Upper Extremity Assessment: Defer to OT evaluation    Lower Extremity Assessment Lower Extremity Assessment: Generalized weakness    Cervical / Trunk Assessment Cervical / Trunk Assessment: Kyphotic  Communication   Communication: No difficulties  Cognition Arousal/Alertness: Awake/alert Behavior During Therapy: WFL for tasks assessed/performed Overall Cognitive Status: Within Functional Limits for tasks assessed                                          General Comments General comments (skin integrity, edema, etc.): Orthostatic vitals just taken per RN so did not re-take. Discussed follow up PT and pt agrees none needed and reports was doing her exercises prior to PT arrival and that she does them daily to keep her knee and back from hurting    Exercises     Assessment/Plan    PT Assessment Patient needs continued PT services  PT Problem List Decreased balance;Pain;Decreased mobility;Decreased coordination;Decreased strength;Decreased activity tolerance       PT Treatment Interventions DME instruction;Functional mobility training;Balance training;Patient/family education;Gait training;Therapeutic activities;Wheelchair mobility training;Neuromuscular re-education    PT Goals (Current goals can be found in the Care Plan section)  Acute Rehab PT Goals Patient Stated Goal: return home with daughter PT Goal Formulation: With patient Time For Goal Achievement: 01/18/23 Potential to Achieve Goals: Good    Frequency Min 3X/week     Co-evaluation               AM-PAC PT "6 Clicks" Mobility  Outcome Measure Help needed turning  from your back to your side while in a flat bed without using bedrails?: None Help needed moving from lying on your back to sitting on the side of a flat bed without using bedrails?: A Little Help needed moving to and from a bed to a chair (including a wheelchair)?: A Little Help needed standing up from a chair using your arms (e.g., wheelchair or bedside chair)?: A Little Help needed to walk in hospital room?: A Little Help needed climbing 3-5 steps with a railing? : A Little 6 Click Score: 19    End of Session Equipment Utilized During Treatment: Gait belt Activity Tolerance: Patient tolerated treatment well Patient left: in bed;with call bell/phone within reach Nurse Communication: Mobility status PT Visit Diagnosis: Difficulty in walking, not elsewhere classified (R26.2)    Time: 4098-1191 PT Time Calculation (min) (ACUTE ONLY): 28 min   Charges:   PT Evaluation $PT Eval Moderate Complexity: 1 Mod PT Treatments $Therapeutic Activity: 8-22 mins        Sheran Lawless, PT Acute Rehabilitation Services Office:[phone] 01/04/2023   Elray Mcgregor 01/04/2023, 4:02 PM

## 2023-01-04 NOTE — Evaluation (Signed)
Occupational Therapy Evaluation Patient Details Name: Kelli Martinez MRN: [id number] DOB: [birth year] Today's Date: 01/04/2023   History of Present Illness 77 y.o. female adm 6/6 with medical history significant of recurrent DVT/PE, diabetes mellitus, chronic lower extremity cramping, asthma, dyslipidemia and GERD, severe lower extremity venous insufficiency causing edema. breast cancer and has right upper extremity lymphedema.  Patient presented to the ED after experiencing a ground-level fall.  Scans are negative.   Clinical Impression   Patient admitted for the diagnosis above.  PTA she lives with her son and DIL, who both work during the day, but are home at night's to assist as needed.  Patient just completed [facility] PT/OT, walks with a 4WRW, and continues to complete her own ADL and light meal prep.  Currently she is needing Min Guard to Supervision for mobility in the room and ADL completion.  OT is indicated in the acute setting to address deficits, and HH could be considered, but for now, no post acute OT is recommended.        Recommendations for follow up therapy are one component of a multi-disciplinary discharge planning process, led by the attending physician.  Recommendations may be updated based on patient status, additional functional criteria and insurance authorization.   Assistance Recommended at Discharge Intermittent Supervision/Assistance  Patient can return home with the following Help with stairs or ramp for entrance;Assist for transportation;Assistance with cooking/housework;A little help with walking and/or transfers;A little help with bathing/dressing/bathroom    Functional Status Assessment  Patient has had a recent decline in their functional status and demonstrates the ability to make significant improvements in function in a reasonable and predictable amount of time.  Equipment Recommendations  None recommended by OT    Recommendations for Other Services        Precautions / Restrictions Precautions Precautions: Fall Precaution Comments: Watch BP Restrictions Weight Bearing Restrictions: No Other Position/Activity Restrictions: Left little toe painful to touch      Mobility Bed Mobility Overal bed mobility: Needs Assistance Bed Mobility: Supine to Sit     Supine to sit: Supervision          Transfers Overall transfer level: Needs assistance Equipment used: Rolling walker (2 wheels) Transfers: Sit to/from Stand, Bed to chair/wheelchair/BSC Sit to Stand: Supervision, From elevated surface     Step pivot transfers: Min guard            Balance Overall balance assessment: Needs assistance Sitting-balance support: Feet supported Sitting balance-Leahy Scale: Good     Standing balance support: Reliant on assistive device for balance Standing balance-Leahy Scale: Poor                             ADL either performed or assessed with clinical judgement   ADL       Grooming: Wash/dry hands;Min guard;Standing               Lower Body Dressing: Min guard;Sit to/from stand   Toilet Transfer: Min guard;Rolling walker (2 wheels);Regular Toilet;Ambulation   Toileting- Clothing Manipulation and Hygiene: Supervision/safety               Vision Patient Visual Report: No change from baseline       Perception     Praxis      Pertinent Vitals/Pain Pain Assessment Pain Assessment: Faces Faces Pain Scale: Hurts whole lot Pain Location: L little toe Pain Descriptors / Indicators: Sharp Pain Intervention(s): Monitored during session  Hand Dominance Right   Extremity/Trunk Assessment Upper Extremity Assessment Upper Extremity Assessment: Overall WFL for tasks assessed;LUE deficits/detail LUE Deficits / Details: Discomfort to L ribs with AROM LUE Sensation: WNL LUE Coordination: WNL   Lower Extremity Assessment Lower Extremity Assessment: Defer to PT evaluation   Cervical / Trunk  Assessment Cervical / Trunk Assessment: Kyphotic   Communication Communication Communication: No difficulties   Cognition Arousal/Alertness: Awake/alert Behavior During Therapy: WFL for tasks assessed/performed Overall Cognitive Status: Within Functional Limits for tasks assessed                                       General Comments   VSS on HR    Exercises     Shoulder Instructions      Home Living Family/patient expects to be discharged to:: Private residence Living Arrangements: Children Available Help at Discharge: Family Type of Home: House Home Access: Ramped entrance     Home Layout: One level     Bathroom Shower/Tub: Producer, television/film/video: Handicapped height Bathroom Accessibility: Yes How Accessible: Accessible via walker Home Equipment: Agricultural consultant (2 wheels);Rollator (4 wheels);BSC/3in1;Adaptive equipment;Hand held shower head Adaptive Equipment: Reacher;Sock aid;Long-handled shoe horn;Long-handled sponge        Prior Functioning/Environment Prior Level of Function : Independent/Modified Independent             Mobility Comments: Just completed HH PT and walking with 4WRW ADLs Comments: Increased time and effort, but continues to complete her own ADL and light meal prep.        OT Problem List: Decreased strength;Decreased activity tolerance;Impaired balance (sitting and/or standing);Pain      OT Treatment/Interventions: Self-care/ADL training;Therapeutic activities;DME and/or AE instruction;Patient/family education;Balance training;Energy conservation    OT Goals(Current goals can be found in the care plan section) Acute Rehab OT Goals Patient Stated Goal: Return home, possibly to daughter's home OT Goal Formulation: With patient Time For Goal Achievement: 01/18/23 Potential to Achieve Goals: Good ADL Goals Pt Will Perform Grooming: with modified independence;standing Pt Will Perform Lower Body Dressing: with  modified independence;with adaptive equipment Pt Will Transfer to Toilet: with modified independence;ambulating;regular height toilet Pt Will Perform Toileting - Clothing Manipulation and hygiene: with modified independence;sit to/from stand;sitting/lateral leans  OT Frequency: Min 2X/week    Co-evaluation              AM-PAC OT "6 Clicks" Daily Activity     Outcome Measure Help from another person eating meals?: None Help from another person taking care of personal grooming?: A Little Help from another person toileting, which includes using toliet, bedpan, or urinal?: A Little Help from another person bathing (including washing, rinsing, drying)?: A Little Help from another person to put on and taking off regular upper body clothing?: None Help from another person to put on and taking off regular lower body clothing?: A Little 6 Click Score: 20   End of Session Equipment Utilized During Treatment: Rolling walker (2 wheels) Nurse Communication: Mobility status  Activity Tolerance: Patient tolerated treatment well Patient left: in chair;with call bell/phone within reach;with family/visitor present  OT Visit Diagnosis: Unsteadiness on feet (R26.81);Muscle weakness (generalized) (M62.81);Pain Pain - Right/Left: Left Pain - part of body: Ankle and joints of foot                Time: 1006-1030 OT Time Calculation (min): 24 min Charges:  OT General Charges $OT Visit:  1 Visit OT Evaluation $OT Eval Moderate Complexity: 1 Mod OT Treatments $Self Care/Home Management : 8-22 mins  01/04/2023  RP, OTR/L  Acute Rehabilitation Services  Office:  [phone]   Suzanna Obey 01/04/2023, 10:40 AM

## 2023-01-04 NOTE — Assessment & Plan Note (Signed)
-   Hold acarbose - SS corrections in the hospital

## 2023-01-04 NOTE — ED Triage Notes (Signed)
Pt coming from home. Ground level fall, hit head on space heater, pt on warfarin. Normally walks with walker. Unknown LOC, un witnessed fall.

## 2023-01-04 NOTE — Assessment & Plan Note (Signed)
BMI 32 

## 2023-01-04 NOTE — Telephone Encounter (Signed)
Pt daughter call and stated pt want dr.Banks to know that she is in the Hospital.

## 2023-01-04 NOTE — Assessment & Plan Note (Signed)
Quit CPAP a long time ago, working on getting it again

## 2023-01-04 NOTE — ED Notes (Signed)
ED TO INPATIENT HANDOFF REPORT  ED Nurse Name and Phone #: Waunetta Riggle RN [phone]  S Name/Age/Gender Kelli Martinez 77 y.o. female Room/Bed: 040C/040C  Code Status   Code Status: Prior  Home/SNF/Other Home Patient oriented to: self, place, time, and situation Is this baseline? Yes   Triage Complete: Triage complete  Chief Complaint Syncope [R55]  Triage Note Pt coming from home. Ground level fall, hit head on space heater, pt on warfarin. Normally walks with walker. Unknown LOC, un witnessed fall.    Allergies Allergies  Allergen Reactions   Adhesive [Tape] Rash    Level of Care/Admitting Diagnosis ED Disposition     ED Disposition  Admit   Condition  --   Comment  Hospital Area: [facility] [[id number]]  Level of Care: Telemetry Medical [104]  May place patient in observation at [facility] or [facility] if equivalent level of care is available:: Yes  Covid Evaluation: Asymptomatic - no recent exposure (last 10 days) testing not required  Diagnosis: Syncope [206001]  Admitting Physician: Briscoe Deutscher [[id number]]  Attending Physician: Briscoe Deutscher [[id number]]          B Medical/Surgery History Past Medical History:  Diagnosis Date   Allergy    Anemia    Arthritis    Asthma    Back pain, chronic    GETS INJECTIONS IN BACK   Blind right eye    hemorrhage   Blood transfusion    Cancer (HCC)    breast 1994   Clotting disorder (HCC)    Diabetes mellitus without complication (HCC)    takes precose   Elevated liver enzymes    Gallstones    GERD (gastroesophageal reflux disease)    HX: breast cancer    Hyperlipidemia    Hypertension    Hypothyroid    Lymphedema of arm    RT   Morbid obesity (HCC)    OSA (obstructive sleep apnea)    has not used in 2 years-lost weight   Osteoporosis    Peripheral neuropathy    on gabapentin   PONV (postoperative nausea and vomiting)    Psoriasis    Pulmonary embolism (HCC) 1998 /  1994 /1968   Syncope and collapse 2011   due to anemia   Past Surgical History:  Procedure Laterality Date   ABDOMINAL HYSTERECTOMY     BONE MARROW TRANSPLANT  1994   CARDIOVASCULAR STRESS TEST  05/23/2005   EF 53%   carpal tunnell     bil   cataracts     CHOLECYSTECTOMY  05/10/2012   Procedure: LAPAROSCOPIC CHOLECYSTECTOMY WITH INTRAOPERATIVE CHOLANGIOGRAM;  Surgeon: Valarie Merino, MD;  Location: [facility];  Service: General;  Laterality: N/A;  Laparoscopic Cholecystectomy with Intraoperative Cholangiogram   GASTRIC BYPASS  2011   bariatric surgery   HIATAL HERNIA REPAIR     IVC filter     recurrent DVT   KNEE ARTHROTOMY  1998   MASS EXCISION Right 11/24/2016   Procedure: EXCISION RIGHT AXILLARY LESION AND CHEST WALL LESION;  Surgeon: Luretha Murphy, MD;  Location:  SURGERY CENTER;  Service: General;  Laterality: Right;   MASTECTOMY     Right   US ECHOCARDIOGRAPHY  10/27/08   EF 55-60%     A IV Location/Drains/Wounds Patient Lines/Drains/Airways Status     Active Line/Drains/Airways     Name Placement date Placement time Site Days   Peripheral IV 01/04/23 20 G Distal;Left;Posterior Forearm 01/04/23  0200  Forearm  less than 1   Incision - 5 Ports Abdomen 1: Umbilicus 2: Left;Superior 3: Right;Medial 4: Right;Lateral 5: Right;Superior 05/10/12  1206  -- 3891            Intake/Output Last 24 hours No intake or output data in the 24 hours ending 01/04/23 0429  Labs/Imaging Results for orders placed or performed during the hospital encounter of 01/04/23 (from the past 48 hour(s))  I-Stat Chem 8, ED     Status: Abnormal   Collection Time: 01/04/23  1:08 AM  Result Value Ref Range   Sodium 136 135 - 145 mmol/L   Potassium 4.8 3.5 - 5.1 mmol/L   Chloride 98 98 - 111 mmol/L   BUN 28 (H) 8 - 23 mg/dL   Creatinine, Ser 1.61 (H) 0.44 - 1.00 mg/dL   Glucose, Bld 096 (H) 70 - 99 mg/dL    Comment: Glucose reference range applies only to samples taken after  fasting for at least 8 hours.   Calcium, Ion 1.17 1.15 - 1.40 mmol/L   TCO2 31 22 - 32 mmol/L   Hemoglobin 12.9 12.0 - 15.0 g/dL   HCT 04.5 40.9 - 81.1 %  CBC with Differential     Status: Abnormal   Collection Time: 01/04/23  1:11 AM  Result Value Ref Range   WBC 6.9 4.0 - 10.5 K/uL   RBC 4.13 3.87 - 5.11 MIL/uL   Hemoglobin 11.6 (L) 12.0 - 15.0 g/dL   HCT 91.4 78.2 - 95.6 %   MCV 89.8 80.0 - 100.0 fL   MCH 28.1 26.0 - 34.0 pg   MCHC 31.3 30.0 - 36.0 g/dL   RDW 21.3 08.6 - 57.8 %   Platelets 248 150 - 400 K/uL   nRBC 0.0 0.0 - 0.2 %   Neutrophils Relative % 69 %   Neutro Abs 4.8 1.7 - 7.7 K/uL   Lymphocytes Relative 21 %   Lymphs Abs 1.4 0.7 - 4.0 K/uL   Monocytes Relative 8 %   Monocytes Absolute 0.5 0.1 - 1.0 K/uL   Eosinophils Relative 1 %   Eosinophils Absolute 0.1 0.0 - 0.5 K/uL   Basophils Relative 1 %   Basophils Absolute 0.1 0.0 - 0.1 K/uL   Immature Granulocytes 0 %   Abs Immature Granulocytes 0.03 0.00 - 0.07 K/uL    Comment: Performed at [facility], [address], [place], [place] [address]  Basic metabolic panel     Status: Abnormal   Collection Time: 01/04/23  1:11 AM  Result Value Ref Range   Sodium 134 (L) 135 - 145 mmol/L   Potassium 4.8 3.5 - 5.1 mmol/L   Chloride 97 (L) 98 - 111 mmol/L   CO2 27 22 - 32 mmol/L   Glucose, Bld 124 (H) 70 - 99 mg/dL    Comment: Glucose reference range applies only to samples taken after fasting for at least 8 hours.   BUN 27 (H) 8 - 23 mg/dL   Creatinine, Ser 9.52 (H) 0.44 - 1.00 mg/dL   Calcium 9.3 8.9 - 84.1 mg/dL   GFR, Estimated 31 (L) >60 mL/min    Comment: (NOTE) Calculated using the CKD-EPI Creatinine Equation (2021)    Anion gap 10 5 - 15    Comment: Performed at [facility], [address] [place], [place] [address]  Troponin I (High Sensitivity)     Status: None   Collection Time: 01/04/23  1:11 AM  Result Value Ref Range  Troponin I (High Sensitivity) 8 <18 ng/L    Comment:  (NOTE) Elevated high sensitivity troponin I (hsTnI) values and significant  changes across serial measurements may suggest ACS but many other  chronic and acute conditions are known to elevate hsTnI results.  Refer to the "Links" section for chest pain algorithms and additional  guidance. Performed at [facility], [address] [place], [place] [address]   Protime-INR     Status: Abnormal   Collection Time: 01/04/23  1:11 AM  Result Value Ref Range   Prothrombin Time 27.0 (H) 11.4 - 15.2 seconds   INR 2.5 (H) 0.8 - 1.2    Comment: (NOTE) INR goal varies based on device and disease states. Performed at [facility], [address] [place], [place] [address]   Ethanol     Status: None   Collection Time: 01/04/23  1:11 AM  Result Value Ref Range   Alcohol, Ethyl (B) <10 <10 mg/dL    Comment: (NOTE) Lowest detectable limit for serum alcohol is 10 mg/dL.  For medical purposes only. Performed at [facility], [address] [place], [place] [address]   Lactic acid, plasma     Status: None   Collection Time: 01/04/23  1:11 AM  Result Value Ref Range   Lactic Acid, Venous 1.3 0.5 - 1.9 mmol/L    Comment: Performed at [facility], [address] [place], [place] [address]  Sample to Blood Bank     Status: None   Collection Time: 01/04/23  1:11 AM  Result Value Ref Range   Blood Bank Specimen SAMPLE AVAILABLE FOR TESTING    Sample Expiration      01/07/2023,2359 Performed at [facility], [address], [place], [place] [address]   Urinalysis, Routine w reflex microscopic -Urine, Clean Catch     Status: Abnormal   Collection Time: 01/04/23  1:52 AM  Result Value Ref Range   Color, Urine COLORLESS (A) YELLOW   APPearance CLEAR CLEAR   Specific Gravity, Urine 1.012 1.005 - 1.030   pH 7.0 5.0 - 8.0   Glucose, UA NEGATIVE NEGATIVE mg/dL   Hgb urine dipstick NEGATIVE NEGATIVE   Bilirubin Urine NEGATIVE NEGATIVE   Ketones, ur NEGATIVE  NEGATIVE mg/dL   Protein, ur NEGATIVE NEGATIVE mg/dL   Nitrite NEGATIVE NEGATIVE   Leukocytes,Ua NEGATIVE NEGATIVE    Comment: Performed at [facility], [address], [place], [place] [address]   *Note: Due to a large number of results and/or encounters for the requested time period, some results have not been displayed. A complete set of results can be found in Results Review.   CT CHEST ABDOMEN PELVIS W CONTRAST  Result Date: 01/04/2023 CLINICAL DATA:  Polytrauma, blunt.  Fall, on blood thinners. EXAM: CT CHEST, ABDOMEN, AND PELVIS WITH CONTRAST TECHNIQUE: Multidetector CT imaging of the chest, abdomen and pelvis was performed following the standard protocol during bolus administration of intravenous contrast. RADIATION DOSE REDUCTION: This exam was performed according to the departmental dose-optimization program which includes automated exposure control, adjustment of the mA and/or kV according to patient size and/or use of iterative reconstruction technique. CONTRAST:  OMNIPAQUE IOHEXOL 350 MG/ML SOLN COMPARISON:  11/11/2012, 11/05/2014. FINDINGS: CT CHEST FINDINGS Cardiovascular: The heart is normal in size and there is no pericardial effusion. Multi-vessel coronary artery calcifications are noted. The aorta and pulmonary trunk are normal in caliber. Mediastinum/Nodes: No mediastinal, hilar, or axillary lymphadenopathy. Surgical clips  are noted in the right axilla. The thyroid gland, trachea, and esophagus are within normal limits. There is a small hiatal hernia. Lungs/Pleura: Fibrotic changes are noted at the right lung apex. There is bronchiectasis with fibrotic changes in the anterior aspect of the right upper and middle lobes, likely related to radiation therapy changes. Atelectasis is noted bilaterally. There is a subpleural nodule in the left upper lobe measuring 3 mm, unchanged from 2016 and likely benign. No effusion or pneumothorax. Musculoskeletal: Mastectomy changes are noted  on the right. Degenerative changes are present in the thoracic spine. No acute fracture. CT ABDOMEN PELVIS FINDINGS Hepatobiliary: No hepatic injury or perihepatic hematoma. Mild biliary ductal dilatation, likely related to post cholecystectomy status. Pancreas: Pancreatic atrophy. No pancreatic ductal dilatation or surrounding inflammatory changes. Spleen: No splenic injury or perisplenic hematoma. Adrenals/Urinary Tract: The adrenal glands are within normal limits. Subcentimeter hypodensities are present in the kidneys bilaterally which are too small to further characterize. A cyst is noted in the lower pole of the right kidney. No renal calculus or hydronephrosis. The bladder is unremarkable. Stomach/Bowel: Gastric surgical changes are noted. There is a small hiatal hernia. No free air or pneumatosis. A moderate amount of retained stool is present in the colon. Appendix is not seen. No evidence of bowel wall thickening, distention, or inflammatory changes. Vascular/Lymphatic: Aortic atherosclerosis. An IVC filter is noted. No enlarged abdominal or pelvic lymph nodes. Reproductive: Status post hysterectomy. No adnexal masses. Other: No abdominopelvic ascites. Musculoskeletal: Degenerative changes are present in the lumbar spine. No acute fracture. IMPRESSION: 1. No evidence of acute fracture or solid organ injury. 2. Moderate amount of retained stool in the colon suggesting constipation. 3. Small hiatal hernia. 4. Aortic atherosclerosis and coronary artery calcifications. Electronically Signed   By: Thornell Sartorius M.D.   On: 01/04/2023 02:03   CT ANGIO HEAD NECK W WO CM  Result Date: 01/04/2023 CLINICAL DATA:  Fall while on blood thinners EXAM: CT ANGIOGRAPHY HEAD AND NECK WITH AND WITHOUT CONTRAST TECHNIQUE: Multidetector CT imaging of the head and neck was performed using the standard protocol during bolus administration of intravenous contrast. Multiplanar CT image reconstructions and MIPs were obtained to  evaluate the vascular anatomy. Carotid stenosis measurements (when applicable) are obtained utilizing NASCET criteria, using the distal internal carotid diameter as the denominator. RADIATION DOSE REDUCTION: This exam was performed according to the departmental dose-optimization program which includes automated exposure control, adjustment of the mA and/or kV according to patient size and/or use of iterative reconstruction technique. CONTRAST:  OMNIPAQUE IOHEXOL 350 MG/ML SOLN COMPARISON:  None Available. FINDINGS: CTA NECK FINDINGS SKELETON: There is no bony spinal canal stenosis. No lytic or blastic lesion. OTHER NECK: Normal pharynx, larynx and major salivary glands. No cervical lymphadenopathy. Unremarkable thyroid gland. UPPER CHEST: Scarring in the anterior apex of the right upper lobe. AORTIC ARCH: There is no calcific atherosclerosis of the aortic arch. There is no aneurysm, dissection or hemodynamically significant stenosis of the visualized portion of the aorta. Conventional 3 vessel aortic branching pattern. The visualized proximal subclavian arteries are widely patent. RIGHT CAROTID SYSTEM: No dissection, occlusion or aneurysm. Mild atherosclerotic calcification at the carotid bifurcation without hemodynamically significant stenosis. LEFT CAROTID SYSTEM: No dissection, occlusion or aneurysm. Mild atherosclerotic calcification at the carotid bifurcation without hemodynamically significant stenosis. VERTEBRAL ARTERIES: Codominant configuration. Both origins are clearly patent. There is no dissection, occlusion or flow-limiting stenosis to the skull base (V1-V3 segments). CTA HEAD FINDINGS POSTERIOR CIRCULATION: --Vertebral arteries: Normal V4 segments. --  Inferior cerebellar arteries: Normal. --Basilar artery: Normal. --Superior cerebellar arteries: Normal. --Posterior cerebral arteries (PCA): Normal. ANTERIOR CIRCULATION: --Intracranial internal carotid arteries: Atherosclerotic calcification of the  internal carotid arteries at the skull base without hemodynamically significant stenosis. --Anterior cerebral arteries (ACA): Normal. Both A1 segments are present. Patent anterior communicating artery (a-comm). --Middle cerebral arteries (MCA): Normal. VENOUS SINUSES: As permitted by contrast timing, patent. ANATOMIC VARIANTS: None Review of the MIP images confirms the above findings. IMPRESSION: 1. No emergent large vessel occlusion or hemodynamically significant stenosis of the head or neck. 2. Mild bilateral carotid bifurcation atherosclerosis without hemodynamically significant stenosis. Electronically Signed   By: Deatra Robinson M.D.   On: 01/04/2023 01:58   CT HEAD WO CONTRAST  Result Date: 01/04/2023 CLINICAL DATA:  Recent fall with headaches and neck pain, initial encounter EXAM: CT HEAD WITHOUT CONTRAST CT MAXILLOFACIAL WITHOUT CONTRAST CT CERVICAL SPINE WITHOUT CONTRAST TECHNIQUE: Multidetector CT imaging of the head, cervical spine, and maxillofacial structures were performed using the standard protocol without intravenous contrast. Multiplanar CT image reconstructions of the cervical spine and maxillofacial structures were also generated. RADIATION DOSE REDUCTION: This exam was performed according to the departmental dose-optimization program which includes automated exposure control, adjustment of the mA and/or kV according to patient size and/or use of iterative reconstruction technique. COMPARISON:  07/12/2021 FINDINGS: CT HEAD FINDINGS Brain: No evidence of acute infarction, hemorrhage, hydrocephalus, extra-axial collection or mass lesion/mass effect. Mild chronic white matter ischemic changes are noted. Vascular: No hyperdense vessel or unexpected calcification. Skull: Normal. Negative for fracture or focal lesion. Other: None CT MAXILLOFACIAL FINDINGS Osseous: No fracture or mandibular dislocation. No destructive process. Orbits: Orbits and their contents are within normal limits. Sinuses:  Paranasal sinuses demonstrate mucosal thickening within the maxillary antra bilaterally. Additionally small air-fluid levels are noted which may be related to the recent injury. Soft tissues: Surrounding soft tissue structures are within normal limits. CT CERVICAL SPINE FINDINGS Alignment: Within normal limits. Skull base and vertebrae: 7 cervical segments are well visualized. Vertebral body height is well maintained. No acute fracture or acute facet abnormality is noted. Multilevel osteophytic changes are seen. Soft tissues and spinal canal: Surrounding soft tissue structures are within normal limits Upper chest: Visualized lung apices demonstrate mild apical scarring on the right. Other: None IMPRESSION: CT of the head: Mild chronic white matter ischemic changes are noted. No acute abnormality seen. CT of the maxillofacial bones: Mucosal thickening and air-fluid levels within the maxillary antra bilaterally. No acute bony abnormality is seen. CT of the cervical spine: Degenerative changes without acute abnormality. Electronically Signed   By: Alcide Clever M.D.   On: 01/04/2023 01:52   CT MAXILLOFACIAL WO CONTRAST  Result Date: 01/04/2023 CLINICAL DATA:  Recent fall with headaches and neck pain, initial encounter EXAM: CT HEAD WITHOUT CONTRAST CT MAXILLOFACIAL WITHOUT CONTRAST CT CERVICAL SPINE WITHOUT CONTRAST TECHNIQUE: Multidetector CT imaging of the head, cervical spine, and maxillofacial structures were performed using the standard protocol without intravenous contrast. Multiplanar CT image reconstructions of the cervical spine and maxillofacial structures were also generated. RADIATION DOSE REDUCTION: This exam was performed according to the departmental dose-optimization program which includes automated exposure control, adjustment of the mA and/or kV according to patient size and/or use of iterative reconstruction technique. COMPARISON:  07/12/2021 FINDINGS: CT HEAD FINDINGS Brain: No evidence of acute  infarction, hemorrhage, hydrocephalus, extra-axial collection or mass lesion/mass effect. Mild chronic white matter ischemic changes are noted. Vascular: No hyperdense vessel or unexpected calcification. Skull: Normal. Negative for fracture  or focal lesion. Other: None CT MAXILLOFACIAL FINDINGS Osseous: No fracture or mandibular dislocation. No destructive process. Orbits: Orbits and their contents are within normal limits. Sinuses: Paranasal sinuses demonstrate mucosal thickening within the maxillary antra bilaterally. Additionally small air-fluid levels are noted which may be related to the recent injury. Soft tissues: Surrounding soft tissue structures are within normal limits. CT CERVICAL SPINE FINDINGS Alignment: Within normal limits. Skull base and vertebrae: 7 cervical segments are well visualized. Vertebral body height is well maintained. No acute fracture or acute facet abnormality is noted. Multilevel osteophytic changes are seen. Soft tissues and spinal canal: Surrounding soft tissue structures are within normal limits Upper chest: Visualized lung apices demonstrate mild apical scarring on the right. Other: None IMPRESSION: CT of the head: Mild chronic white matter ischemic changes are noted. No acute abnormality seen. CT of the maxillofacial bones: Mucosal thickening and air-fluid levels within the maxillary antra bilaterally. No acute bony abnormality is seen. CT of the cervical spine: Degenerative changes without acute abnormality. Electronically Signed   By: Alcide Clever M.D.   On: 01/04/2023 01:52   CT C-SPINE NO CHARGE  Result Date: 01/04/2023 CLINICAL DATA:  Recent fall with headaches and neck pain, initial encounter EXAM: CT HEAD WITHOUT CONTRAST CT MAXILLOFACIAL WITHOUT CONTRAST CT CERVICAL SPINE WITHOUT CONTRAST TECHNIQUE: Multidetector CT imaging of the head, cervical spine, and maxillofacial structures were performed using the standard protocol without intravenous contrast. Multiplanar CT  image reconstructions of the cervical spine and maxillofacial structures were also generated. RADIATION DOSE REDUCTION: This exam was performed according to the departmental dose-optimization program which includes automated exposure control, adjustment of the mA and/or kV according to patient size and/or use of iterative reconstruction technique. COMPARISON:  07/12/2021 FINDINGS: CT HEAD FINDINGS Brain: No evidence of acute infarction, hemorrhage, hydrocephalus, extra-axial collection or mass lesion/mass effect. Mild chronic white matter ischemic changes are noted. Vascular: No hyperdense vessel or unexpected calcification. Skull: Normal. Negative for fracture or focal lesion. Other: None CT MAXILLOFACIAL FINDINGS Osseous: No fracture or mandibular dislocation. No destructive process. Orbits: Orbits and their contents are within normal limits. Sinuses: Paranasal sinuses demonstrate mucosal thickening within the maxillary antra bilaterally. Additionally small air-fluid levels are noted which may be related to the recent injury. Soft tissues: Surrounding soft tissue structures are within normal limits. CT CERVICAL SPINE FINDINGS Alignment: Within normal limits. Skull base and vertebrae: 7 cervical segments are well visualized. Vertebral body height is well maintained. No acute fracture or acute facet abnormality is noted. Multilevel osteophytic changes are seen. Soft tissues and spinal canal: Surrounding soft tissue structures are within normal limits Upper chest: Visualized lung apices demonstrate mild apical scarring on the right. Other: None IMPRESSION: CT of the head: Mild chronic white matter ischemic changes are noted. No acute abnormality seen. CT of the maxillofacial bones: Mucosal thickening and air-fluid levels within the maxillary antra bilaterally. No acute bony abnormality is seen. CT of the cervical spine: Degenerative changes without acute abnormality. Electronically Signed   By: Alcide Clever M.D.    On: 01/04/2023 01:52   DG Shoulder Left Portable  Result Date: 01/04/2023 CLINICAL DATA:  Recent fall with left shoulder pain, initial encounter EXAM: LEFT SHOULDER COMPARISON:  None Available. FINDINGS: Mild degenerative changes of the acromioclavicular joint are seen. No acute fracture or dislocation is noted. No soft tissue abnormality is seen. Degenerative changes of the glenohumeral joint are seen as well. IMPRESSION: Degenerative change without acute abnormality. Electronically Signed   By: Eulah Pont.D.  On: 01/04/2023 01:19   DG Chest Portable 1 View  Result Date: 01/04/2023 CLINICAL DATA:  Recent fall with head injury, initial encounter EXAM: PORTABLE CHEST 1 VIEW COMPARISON:  10/05/2022 FINDINGS: Cardiac shadow is enlarged but stable. Lungs are well aerated bilaterally. No focal infiltrate is seen. Mild central vascular congestion is noted. Postsurgical changes in the right axilla are seen. IMPRESSION: Mild vascular congestion without edema. Electronically Signed   By: Alcide Clever M.D.   On: 01/04/2023 01:19   DG Pelvis Portable  Result Date: 01/04/2023 CLINICAL DATA:  Recent fall with pelvic pain, initial encounter EXAM: PORTABLE PELVIS 1 VIEWS COMPARISON:  None Available. FINDINGS: Pelvic ring is intact. No acute fracture or dislocation is seen. No soft tissue abnormality is noted. IMPRESSION: No acute abnormality noted. Electronically Signed   By: Alcide Clever M.D.   On: 01/04/2023 01:18    Pending Labs Unresulted Labs (From admission, onward)    None       Vitals/Pain Today's Vitals   01/04/23 0200 01/04/23 0222 01/04/23 0300 01/04/23 0330  BP: 136/80 135/76 128/73 134/62  Pulse: 69 74 63 65  Resp: 12 13 10 14   Temp:      TempSrc:      SpO2: 100% 100% 100% 100%  PainSc:        Isolation Precautions No active isolations  Medications Medications  iohexol (OMNIPAQUE) 350 MG/ML injection 100 mL (100 mLs Intravenous Contrast Given 01/04/23 0138)  lactated ringers  bolus 1,000 mL (1,000 mLs Intravenous New Bag/Given 01/04/23 0348)    Mobility walks with device     Focused Assessments Patient alert and oriented. Uses a walker at home.     R Recommendations: See Admitting Provider Note  Report given to:   Additional Notes: .

## 2023-01-04 NOTE — Plan of Care (Signed)
  Problem: Clinical Measurements: Goal: Respiratory complications will improve Outcome: Progressing   Problem: Activity: Goal: Risk for activity intolerance will decrease Outcome: Progressing   Problem: Nutrition: Goal: Adequate nutrition will be maintained Outcome: Progressing   Problem: Elimination: Goal: Will not experience complications related to urinary retention Outcome: Progressing   Problem: Pain Managment: Goal: General experience of comfort will improve Outcome: Progressing   

## 2023-01-05 ENCOUNTER — Ambulatory Visit: Payer: Medicare Other | Admitting: Internal Medicine

## 2023-01-05 DIAGNOSIS — R55 Syncope and collapse: Secondary | ICD-10-CM | POA: Diagnosis not present

## 2023-01-05 LAB — BASIC METABOLIC PANEL
Anion gap: 12 (ref 5–15)
BUN: 15 mg/dL (ref 8–23)
CO2: 24 mmol/L (ref 22–32)
Calcium: 9 mg/dL (ref 8.9–10.3)
Chloride: 100 mmol/L (ref 98–111)
Creatinine, Ser: 0.94 mg/dL (ref 0.44–1.00)
GFR, Estimated: >60 mL/min (ref >60)
Glucose, Bld: 165 mg/dL — ABNORMAL HIGH (ref 70–99)
Potassium: 3.7 mmol/L (ref 3.5–5.1)
Sodium: 136 mmol/L (ref 135–145)

## 2023-01-05 LAB — CBC
HCT: 34.3 % — ABNORMAL LOW (ref 36.0–46.0)
Hemoglobin: 10.7 g/dL — ABNORMAL LOW (ref 12.0–15.0)
MCH: 27.9 pg (ref 26.0–34.0)
MCHC: 31.2 g/dL (ref 30.0–36.0)
MCV: 89.6 fL (ref 80.0–100.0)
Platelets: 218 10*3/uL (ref 150–400)
RBC: 3.83 MIL/uL — ABNORMAL LOW (ref 3.87–5.11)
RDW: 14.2 % (ref 11.5–15.5)
WBC: 5.5 10*3/uL (ref 4.0–10.5)
nRBC: 0 % (ref 0.0–0.2)

## 2023-01-05 LAB — PROTIME-INR
INR: 2.2 — ABNORMAL HIGH (ref 0.8–1.2)
Prothrombin Time: 24.6 seconds — ABNORMAL HIGH (ref 11.4–15.2)

## 2023-01-05 LAB — GLUCOSE, CAPILLARY
Glucose-Capillary: 124 mg/dL — ABNORMAL HIGH (ref 70–99)
Glucose-Capillary: 92 mg/dL (ref 70–99)

## 2023-01-05 MED ORDER — WARFARIN SODIUM 5 MG PO TABS: 10.0000 mg | ORAL_TABLET | Freq: Once | ORAL | Status: DC

## 2023-01-05 NOTE — TOC Transition Note (Signed)
Transition of Care [facility]) - CM/SW Discharge Note   Patient Details  Name: Kelli Martinez MRN: [id number] Date of Birth: 02-26-1946  [facility]) CM/SW Contact:  Epifanio Lesches, RN Phone Number: 01/05/2023, 1:20 PM   Clinical Narrative:    Patient will DC to: home Anticipated DC date: 01/05/2023 Family notified: yes Transport by: car  Per MD patient ready for DC today. RN, patient, and patient's daughter notified of DC. Pt without transportation or RX med concerns. Pt without DME or home health needs noted per therapy recommendations. Son/daughter to assist with intermittent supervision and assistance needed once d/c.  Post hospital f/u noted on AVS.  RNCM will sign off for now as intervention is no longer needed. Please consult [place] again if new needs arise.    Final next level of care: Home/Self Care Barriers to Discharge: No Barriers Identified   Patient Goals and CMS Choice      Discharge Placement                         Discharge Plan and Services Additional resources added to the After Visit Summary for                                       Social Determinants of Health (SDOH) Interventions SDOH Screenings   Food Insecurity: No Food Insecurity (10/13/2022)  Housing: Low Risk  (10/13/2022)  Transportation Needs: No Transportation Needs (10/13/2022)  Utilities: Not At Risk (10/13/2022)  Alcohol Screen: Low Risk  (10/13/2022)  Depression (PHQ2-9): Low Risk  (10/13/2022)  Financial Resource Strain: Low Risk  (10/13/2022)  Physical Activity: Sufficiently Active (10/13/2022)  Recent Concern: Physical Activity - Insufficiently Active (10/05/2022)  Social Connections: Moderately Integrated (10/13/2022)  Stress: No Stress Concern Present (10/13/2022)  Tobacco Use: Medium Risk (10/13/2022)     Readmission Risk Interventions     No data to display

## 2023-01-05 NOTE — Progress Notes (Signed)
Discharge instructions given. Patient verbalized understanding and all questions were answered.  ?

## 2023-01-05 NOTE — Progress Notes (Signed)
ANTICOAGULATION CONSULT NOTE - Initial Consult  Pharmacy Consult for warfarin Indication: history of pulmonary embolus  Allergies  Allergen Reactions   Adhesive [Tape] Rash    Patient Measurements:   Heparin Dosing Weight:   Vital Signs: Temp: 98.3 F (36.8 C) (06/07 0739) Temp Source: Oral (06/07 0739) BP: 98/45 (06/07 0739) Pulse Rate: 69 (06/07 0739)  Labs: Recent Labs    01/04/23 0108 01/04/23 0111 01/04/23 0300 01/05/23 0804  HGB 12.9 11.6*  --  10.7*  HCT 38.0 37.1  --  34.3*  PLT  --  248  --  218  LABPROT  --  27.0*  --  24.6*  INR  --  2.5*  --  2.2*  CREATININE 1.70* 1.71*  --   --   TROPONINIHS  --  8 8  --      CrCl cannot be calculated (Unknown ideal weight.).   Medical History: Past Medical History:  Diagnosis Date   Allergy    Anemia    Arthritis    Asthma    Back pain, chronic    GETS INJECTIONS IN BACK   Blind right eye    hemorrhage   Blood transfusion    Cancer (HCC)    breast 1994   Clotting disorder (HCC)    Diabetes mellitus without complication (HCC)    takes precose   Elevated liver enzymes    Gallstones    GERD (gastroesophageal reflux disease)    HX: breast cancer    Hyperlipidemia    Hypertension    Hypothyroid    Lymphedema of arm    RT   Morbid obesity (HCC)    OSA (obstructive sleep apnea)    has not used in 2 years-lost weight   Osteoporosis    Peripheral neuropathy    on gabapentin   PONV (postoperative nausea and vomiting)    Psoriasis    Pulmonary embolism (HCC) 1998 / 1994 /1968   Syncope and collapse 2011   due to anemia    Medications:  Scheduled:   acarbose  50 mg Oral TID WC   baclofen  20 mg Oral TID   fluticasone  1 spray Each Nare BID   gabapentin  600 mg Oral TID   insulin aspart  0-5 Units Subcutaneous QHS   insulin aspart  0-6 Units Subcutaneous TID WC   levothyroxine  100 mcg Oral Q0600   mometasone-formoterol  2 puff Inhalation BID   pantoprazole  40 mg Oral Daily   pravastatin   10 mg Oral Daily   sodium chloride flush  3 mL Intravenous Q12H   Warfarin - Pharmacist Dosing Inpatient   Does not apply q1600    Assessment: 77 yo female admitted after a fall. Patient was found down after falling and hitting her head on a space heater. CT head negative for hemorrhage. Patient is on warfarin PTA for hx of PE. Discussed with patient and her current dosing regimen is warfarin 10mg  daily except for warfarin 5mg  on Wednesdays and Saturdays. Last dose of warfarin was 01/03/23.  INR today therapeutic at 2.2. CBC stable.   Goal of Therapy:  INR 2-3 Monitor platelets by anticoagulation protocol: Yes   Plan:  Warfarin 10 mg PO x1 this evening Daily INR, CBC  Rexford Maus, PharmD, BCPS 01/05/2023 8:42 AM

## 2023-01-05 NOTE — Discharge Summary (Signed)
Physician Discharge Summary  Kelli Martinez YIR:[id number] DOB: 1946/06/28 DOA: 01/04/2023  PCP: Deeann Saint, MD  Admit date: 01/04/2023  Discharge date: 01/05/2023  Admitted From: Home.  Disposition:  Home.  Recommendations for Outpatient Follow-up:  Follow up with PCP in 1-2 weeks. Please obtain BMP/CBC in one week. Advised to hold lasix and spironolactone until follow-up with PCP.  Home Health:None Equipment/Devices:Rolling walker  Discharge Condition: Stable CODE STATUS:Full code Diet recommendation: Heart Healthy  Brief [facility] Course: [name] is a 77 y.o. 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 recurrent VTE on warfartin, HTN, hypothyroidism, DM and OSA who presented with syncope. Her PCP has been addressing in the outpatient setting excellently by reducing her diuretics, but as would be expected in a 77 y.o. with obesity and a medicine list >10 medicines (including temazepam, gabapentin, baclofen, and hydrocodone), She is still at high risk for dizziness, weakness and falls.  Labs include: TSH normal.  Cr up to 1.7 (from baseline 1-1.2).   No signs or symptoms of infection, hypercarbia, heart failure, stroke, seizure.   Patient was admitted for syncope likely secondary to overdiuresis with Lasix and spironolactone.  Lasix and spironolactone was kept on hold.  Patient was given fluid resuscitation.  Serum creatinine improved and back to baseline.  Patient feels much improved and wants to be discharged.  PT and OT recommended outpatient PT.  Patient is being discharged home.   Discharge Diagnoses:  Principal Problem:   Syncope Active Problems:   Allergic asthma, mild persistent, uncomplicated   Obstructive sleep apnea   BREAST CANCER, HX OF   Hypothyroidism   Hypertension   Roux Y Gastric Bypass April 2011; repair HH   Obesity (BMI 30-39.9)   History of recurrent venous thromboembolism   Type 2 diabetes  mellitus with diabetic polyneuropathy, without long-term current use of insulin (HCC)  Syncope Orthostasis is the obvious cause of her fall.  Not infection or other metabolic causes.  TSH normal.  B12 def possible, but less likely than overdiuresis + too many psychoactive medicines. -She was given IV fluid resuscitation. - Hold furosemide and spironolactone - Hold gabapentin, baclofen - Should reconsider sedating medicines such as temazepam, hydrocodone as outpatient - Check B12, vitamin D given hx roux-en-y - PCP could consider checking fat soluble vitamins A, E as well as thiamine in outpatient setting -Syncope resolved.  She wants to be discharged.  Patient being discharged home.     Type 2 diabetes mellitus with diabetic polyneuropathy, without long-term current use of insulin (HCC) - Hold acarbose. - SS corrections in the hospital   History of recurrent venous thromboembolism - Continue warfarin   Obesity (BMI 30-39.9) BMI 32   Roux Y Gastric Bypass April 2011; repair HH     Hypertension - Hold lasix, spironolactone, resume at follow up with PCP.   Hypothyroidism - Continue LT4   BREAST CANCER, HX OF     Obstructive sleep apnea Quit CPAP a long time ago, working on getting it again   Allergic asthma, mild persistent, uncomplicated - Continue home ICS-LABA  Discharge Instructions  Discharge Instructions     Call MD for:  difficulty breathing, headache or visual disturbances   Complete by: As directed    Call MD for:  persistant dizziness or light-headedness   Complete by: As directed    Call MD for:  persistant nausea and vomiting   Complete by: As directed    Diet - low sodium  heart healthy   Complete by: As directed    Diet Carb Modified   Complete by: As directed    Discharge instructions   Complete by: As directed    Advised to follow-up with primary care physician in 1 week. Advised to hold lisinopril and spironolactone until follow-up with PCP.    Increase activity slowly   Complete by: As directed       Allergies as of 01/05/2023       Reactions   Adhesive [tape] Rash        Medication List     STOP taking these medications    acarbose 50 MG tablet Commonly known as: PRECOSE   furosemide 80 MG tablet Commonly known as: LASIX   spironolactone 100 MG tablet Commonly known as: ALDACTONE       TAKE these medications    albuterol 108 (90 Base) MCG/ACT inhaler Commonly known as: ProAir HFA Inhale 2 puffs into the lungs every 6 (six) hours as needed for wheezing or shortness of breath.   baclofen 20 MG tablet Commonly known as: LIORESAL TAKE 1 TABLET THREE TIMES A DAY (UPDATED PRESCRIPTION) What changed: See the new instructions.   CALCITRATE PO Take 1 tablet by mouth daily.   CALTRATE BONE HEALTH PO Take 1 tablet by mouth daily.   clobetasol cream 0.05 % Commonly known as: TEMOVATE APPLY 1 APPLICATION TOPICALLY TWICE A DAY What changed: See the new instructions.   dorzolamide-timolol 2-0.5 % ophthalmic solution Commonly known as: COSOPT Place 1 drop into the left eye 2 (two) times daily.   fluticasone 50 MCG/ACT nasal spray Commonly known as: FLONASE USE 2 SPRAYS IN EACH NOSTRIL DAILY What changed:  how much to take when to take this   fluticasone-salmeterol 100-50 MCG/ACT Aepb Commonly known as: Advair Diskus USE 1 INHALATION EVERY 12 HOURS, RINSE MOUTH AFTER USING What changed:  how much to take how to take this when to take this additional instructions   FreeStyle Freedom Lite w/Device Kit Use as directed to check blood sugar once daily.   freestyle lancets USE AS INSTRUCTED TO CHECK BLOOD SUGAR ONCE DAILY   FREESTYLE LITE test strip Generic drug: glucose blood USE AS INSTRUCTED TO CHECK BLOOD SUGAR ONCE DAILY   gabapentin 600 MG tablet Commonly known as: NEURONTIN TAKE 1 TABLET THREE TIMES A DAY (DOSE CHANGE) What changed: See the new instructions.   HYDROcodone-acetaminophen  5-325 MG tablet Commonly known as: Norco Take 1 tablet by mouth 2 (two) times daily as needed for moderate pain. What changed:  how much to take reasons to take this   levothyroxine 100 MCG tablet Commonly known as: SYNTHROID TAKE 1 TABLET DAILY BEFORE BREAKFAST (NEED WHEN 88 MCG OUT) What changed: See the new instructions.   LIQUID TEARS OP Place 1 drop into the right eye 2 (two) times daily.   Magnesium 250 MG Tabs Take 250 mg by mouth 2 (two) times daily.   omeprazole 40 MG capsule Commonly known as: PRILOSEC Take 1 capsule (40 mg total) by mouth daily.   potassium chloride SA 20 MEQ tablet Commonly known as: KLOR-CON M Take 2 tablets (40 mEq total) by mouth 2 (two) times daily. What changed:  how much to take when to take this   pravastatin 10 MG tablet Commonly known as: PRAVACHOL TAKE 1 TABLET DAILY   temazepam 30 MG capsule Commonly known as: RESTORIL 1-2 for sleep as needed What changed:  how much to take how to take this when to  take this reasons to take this additional instructions   TruForm Arm Sleeve L 15-76mmHg Misc 1 Device by Does not apply route as directed.   ursodiol 300 MG capsule Commonly known as: ACTIGALL Take 2 capsules (600 mg total) by mouth 2 (two) times daily.   VITAMIN B-12 PO Take 1 tablet by mouth every other day.   Vitamin D (Ergocalciferol) 50000 units Caps Take 1 capsule by mouth once a week. What changed:  how much to take when to take this   warfarin 5 MG tablet Commonly known as: COUMADIN Take as directed. If you are unsure how to take this medication, talk to your nurse or doctor. Original instructions: TAKE 2 TABLETS DAILY EXCEPT TAKE 1 TABLET ON WEDNESDAYS AND SATURDAYS OR TAKE AS DIRECTED BY ANTICOAGULATION CLINIC What changed:  how much to take how to take this when to take this additional instructions        Follow-up Information     Deeann Saint, MD Follow up.   Specialty: Family  Medicine Contact information: [address] [place] [address] [phone]                Allergies  Allergen Reactions   Adhesive [Tape] Rash    Consultations: None   Procedures/Studies: DG Foot Complete Left  Result Date: 01/04/2023 CLINICAL DATA:  Left foot pain EXAM: LEFT FOOT - COMPLETE 3+ VIEW COMPARISON:  None Available. FINDINGS: Generalized soft tissue swelling is noted about the metatarsals. Tarsal degenerative changes and calcaneal spurring is seen. No acute fracture or dislocation is noted. IMPRESSION: Soft tissue swelling and degenerative change without acute abnormality. Electronically Signed   By: Alcide Clever M.D.   On: 01/04/2023 19:11   CT CHEST ABDOMEN PELVIS W CONTRAST  Result Date: 01/04/2023 CLINICAL DATA:  Polytrauma, blunt.  Fall, on blood thinners. EXAM: CT CHEST, ABDOMEN, AND PELVIS WITH CONTRAST TECHNIQUE: Multidetector CT imaging of the chest, abdomen and pelvis was performed following the standard protocol during bolus administration of intravenous contrast. RADIATION DOSE REDUCTION: This exam was performed according to the departmental dose-optimization program which includes automated exposure control, adjustment of the mA and/or kV according to patient size and/or use of iterative reconstruction technique. CONTRAST:  OMNIPAQUE IOHEXOL 350 MG/ML SOLN COMPARISON:  11/11/2012, 11/05/2014. FINDINGS: CT CHEST FINDINGS Cardiovascular: The heart is normal in size and there is no pericardial effusion. Multi-vessel coronary artery calcifications are noted. The aorta and pulmonary trunk are normal in caliber. Mediastinum/Nodes: No mediastinal, hilar, or axillary lymphadenopathy. Surgical clips are noted in the right axilla. The thyroid gland, trachea, and esophagus are within normal limits. There is a small hiatal hernia. Lungs/Pleura: Fibrotic changes are noted at the right lung apex. There is bronchiectasis with fibrotic changes in the anterior  aspect of the right upper and middle lobes, likely related to radiation therapy changes. Atelectasis is noted bilaterally. There is a subpleural nodule in the left upper lobe measuring 3 mm, unchanged from 2016 and likely benign. No effusion or pneumothorax. Musculoskeletal: Mastectomy changes are noted on the right. Degenerative changes are present in the thoracic spine. No acute fracture. CT ABDOMEN PELVIS FINDINGS Hepatobiliary: No hepatic injury or perihepatic hematoma. Mild biliary ductal dilatation, likely related to post cholecystectomy status. Pancreas: Pancreatic atrophy. No pancreatic ductal dilatation or surrounding inflammatory changes. Spleen: No splenic injury or perisplenic hematoma. Adrenals/Urinary Tract: The adrenal glands are within normal limits. Subcentimeter hypodensities are present in the kidneys bilaterally which are too small to further characterize. A cyst  is noted in the lower pole of the right kidney. No renal calculus or hydronephrosis. The bladder is unremarkable. Stomach/Bowel: Gastric surgical changes are noted. There is a small hiatal hernia. No free air or pneumatosis. A moderate amount of retained stool is present in the colon. Appendix is not seen. No evidence of bowel wall thickening, distention, or inflammatory changes. Vascular/Lymphatic: Aortic atherosclerosis. An IVC filter is noted. No enlarged abdominal or pelvic lymph nodes. Reproductive: Status post hysterectomy. No adnexal masses. Other: No abdominopelvic ascites. Musculoskeletal: Degenerative changes are present in the lumbar spine. No acute fracture. IMPRESSION: 1. No evidence of acute fracture or solid organ injury. 2. Moderate amount of retained stool in the colon suggesting constipation. 3. Small hiatal hernia. 4. Aortic atherosclerosis and coronary artery calcifications. Electronically Signed   By: Thornell Sartorius M.D.   On: 01/04/2023 02:03   CT ANGIO HEAD NECK W WO CM  Result Date: 01/04/2023 CLINICAL DATA:   Fall while on blood thinners EXAM: CT ANGIOGRAPHY HEAD AND NECK WITH AND WITHOUT CONTRAST TECHNIQUE: Multidetector CT imaging of the head and neck was performed using the standard protocol during bolus administration of intravenous contrast. Multiplanar CT image reconstructions and MIPs were obtained to evaluate the vascular anatomy. Carotid stenosis measurements (when applicable) are obtained utilizing NASCET criteria, using the distal internal carotid diameter as the denominator. RADIATION DOSE REDUCTION: This exam was performed according to the departmental dose-optimization program which includes automated exposure control, adjustment of the mA and/or kV according to patient size and/or use of iterative reconstruction technique. CONTRAST:  OMNIPAQUE IOHEXOL 350 MG/ML SOLN COMPARISON:  None Available. FINDINGS: CTA NECK FINDINGS SKELETON: There is no bony spinal canal stenosis. No lytic or blastic lesion. OTHER NECK: Normal pharynx, larynx and major salivary glands. No cervical lymphadenopathy. Unremarkable thyroid gland. UPPER CHEST: Scarring in the anterior apex of the right upper lobe. AORTIC ARCH: There is no calcific atherosclerosis of the aortic arch. There is no aneurysm, dissection or hemodynamically significant stenosis of the visualized portion of the aorta. Conventional 3 vessel aortic branching pattern. The visualized proximal subclavian arteries are widely patent. RIGHT CAROTID SYSTEM: No dissection, occlusion or aneurysm. Mild atherosclerotic calcification at the carotid bifurcation without hemodynamically significant stenosis. LEFT CAROTID SYSTEM: No dissection, occlusion or aneurysm. Mild atherosclerotic calcification at the carotid bifurcation without hemodynamically significant stenosis. VERTEBRAL ARTERIES: Codominant configuration. Both origins are clearly patent. There is no dissection, occlusion or flow-limiting stenosis to the skull base (V1-V3 segments). CTA HEAD FINDINGS POSTERIOR  CIRCULATION: --Vertebral arteries: Normal V4 segments. --Inferior cerebellar arteries: Normal. --Basilar artery: Normal. --Superior cerebellar arteries: Normal. --Posterior cerebral arteries (PCA): Normal. ANTERIOR CIRCULATION: --Intracranial internal carotid arteries: Atherosclerotic calcification of the internal carotid arteries at the skull base without hemodynamically significant stenosis. --Anterior cerebral arteries (ACA): Normal. Both A1 segments are present. Patent anterior communicating artery (a-comm). --Middle cerebral arteries (MCA): Normal. VENOUS SINUSES: As permitted by contrast timing, patent. ANATOMIC VARIANTS: None Review of the MIP images confirms the above findings. IMPRESSION: 1. No emergent large vessel occlusion or hemodynamically significant stenosis of the head or neck. 2. Mild bilateral carotid bifurcation atherosclerosis without hemodynamically significant stenosis. Electronically Signed   By: Deatra Robinson M.D.   On: 01/04/2023 01:58   CT HEAD WO CONTRAST  Result Date: 01/04/2023 CLINICAL DATA:  Recent fall with headaches and neck pain, initial encounter EXAM: CT HEAD WITHOUT CONTRAST CT MAXILLOFACIAL WITHOUT CONTRAST CT CERVICAL SPINE WITHOUT CONTRAST TECHNIQUE: Multidetector CT imaging of the head, cervical spine, and maxillofacial structures were  performed using the standard protocol without intravenous contrast. Multiplanar CT image reconstructions of the cervical spine and maxillofacial structures were also generated. RADIATION DOSE REDUCTION: This exam was performed according to the departmental dose-optimization program which includes automated exposure control, adjustment of the mA and/or kV according to patient size and/or use of iterative reconstruction technique. COMPARISON:  07/12/2021 FINDINGS: CT HEAD FINDINGS Brain: No evidence of acute infarction, hemorrhage, hydrocephalus, extra-axial collection or mass lesion/mass effect. Mild chronic white matter ischemic changes are  noted. Vascular: No hyperdense vessel or unexpected calcification. Skull: Normal. Negative for fracture or focal lesion. Other: None CT MAXILLOFACIAL FINDINGS Osseous: No fracture or mandibular dislocation. No destructive process. Orbits: Orbits and their contents are within normal limits. Sinuses: Paranasal sinuses demonstrate mucosal thickening within the maxillary antra bilaterally. Additionally small air-fluid levels are noted which may be related to the recent injury. Soft tissues: Surrounding soft tissue structures are within normal limits. CT CERVICAL SPINE FINDINGS Alignment: Within normal limits. Skull base and vertebrae: 7 cervical segments are well visualized. Vertebral body height is well maintained. No acute fracture or acute facet abnormality is noted. Multilevel osteophytic changes are seen. Soft tissues and spinal canal: Surrounding soft tissue structures are within normal limits Upper chest: Visualized lung apices demonstrate mild apical scarring on the right. Other: None IMPRESSION: CT of the head: Mild chronic white matter ischemic changes are noted. No acute abnormality seen. CT of the maxillofacial bones: Mucosal thickening and air-fluid levels within the maxillary antra bilaterally. No acute bony abnormality is seen. CT of the cervical spine: Degenerative changes without acute abnormality. Electronically Signed   By: Alcide Clever M.D.   On: 01/04/2023 01:52   CT MAXILLOFACIAL WO CONTRAST  Result Date: 01/04/2023 CLINICAL DATA:  Recent fall with headaches and neck pain, initial encounter EXAM: CT HEAD WITHOUT CONTRAST CT MAXILLOFACIAL WITHOUT CONTRAST CT CERVICAL SPINE WITHOUT CONTRAST TECHNIQUE: Multidetector CT imaging of the head, cervical spine, and maxillofacial structures were performed using the standard protocol without intravenous contrast. Multiplanar CT image reconstructions of the cervical spine and maxillofacial structures were also generated. RADIATION DOSE REDUCTION: This exam  was performed according to the departmental dose-optimization program which includes automated exposure control, adjustment of the mA and/or kV according to patient size and/or use of iterative reconstruction technique. COMPARISON:  07/12/2021 FINDINGS: CT HEAD FINDINGS Brain: No evidence of acute infarction, hemorrhage, hydrocephalus, extra-axial collection or mass lesion/mass effect. Mild chronic white matter ischemic changes are noted. Vascular: No hyperdense vessel or unexpected calcification. Skull: Normal. Negative for fracture or focal lesion. Other: None CT MAXILLOFACIAL FINDINGS Osseous: No fracture or mandibular dislocation. No destructive process. Orbits: Orbits and their contents are within normal limits. Sinuses: Paranasal sinuses demonstrate mucosal thickening within the maxillary antra bilaterally. Additionally small air-fluid levels are noted which may be related to the recent injury. Soft tissues: Surrounding soft tissue structures are within normal limits. CT CERVICAL SPINE FINDINGS Alignment: Within normal limits. Skull base and vertebrae: 7 cervical segments are well visualized. Vertebral body height is well maintained. No acute fracture or acute facet abnormality is noted. Multilevel osteophytic changes are seen. Soft tissues and spinal canal: Surrounding soft tissue structures are within normal limits Upper chest: Visualized lung apices demonstrate mild apical scarring on the right. Other: None IMPRESSION: CT of the head: Mild chronic white matter ischemic changes are noted. No acute abnormality seen. CT of the maxillofacial bones: Mucosal thickening and air-fluid levels within the maxillary antra bilaterally. No acute bony abnormality is seen. CT of the  cervical spine: Degenerative changes without acute abnormality. Electronically Signed   By: Alcide Clever M.D.   On: 01/04/2023 01:52   CT C-SPINE NO CHARGE  Result Date: 01/04/2023 CLINICAL DATA:  Recent fall with headaches and neck pain,  initial encounter EXAM: CT HEAD WITHOUT CONTRAST CT MAXILLOFACIAL WITHOUT CONTRAST CT CERVICAL SPINE WITHOUT CONTRAST TECHNIQUE: Multidetector CT imaging of the head, cervical spine, and maxillofacial structures were performed using the standard protocol without intravenous contrast. Multiplanar CT image reconstructions of the cervical spine and maxillofacial structures were also generated. RADIATION DOSE REDUCTION: This exam was performed according to the departmental dose-optimization program which includes automated exposure control, adjustment of the mA and/or kV according to patient size and/or use of iterative reconstruction technique. COMPARISON:  07/12/2021 FINDINGS: CT HEAD FINDINGS Brain: No evidence of acute infarction, hemorrhage, hydrocephalus, extra-axial collection or mass lesion/mass effect. Mild chronic white matter ischemic changes are noted. Vascular: No hyperdense vessel or unexpected calcification. Skull: Normal. Negative for fracture or focal lesion. Other: None CT MAXILLOFACIAL FINDINGS Osseous: No fracture or mandibular dislocation. No destructive process. Orbits: Orbits and their contents are within normal limits. Sinuses: Paranasal sinuses demonstrate mucosal thickening within the maxillary antra bilaterally. Additionally small air-fluid levels are noted which may be related to the recent injury. Soft tissues: Surrounding soft tissue structures are within normal limits. CT CERVICAL SPINE FINDINGS Alignment: Within normal limits. Skull base and vertebrae: 7 cervical segments are well visualized. Vertebral body height is well maintained. No acute fracture or acute facet abnormality is noted. Multilevel osteophytic changes are seen. Soft tissues and spinal canal: Surrounding soft tissue structures are within normal limits Upper chest: Visualized lung apices demonstrate mild apical scarring on the right. Other: None IMPRESSION: CT of the head: Mild chronic white matter ischemic changes are  noted. No acute abnormality seen. CT of the maxillofacial bones: Mucosal thickening and air-fluid levels within the maxillary antra bilaterally. No acute bony abnormality is seen. CT of the cervical spine: Degenerative changes without acute abnormality. Electronically Signed   By: Alcide Clever M.D.   On: 01/04/2023 01:52   DG Shoulder Left Portable  Result Date: 01/04/2023 CLINICAL DATA:  Recent fall with left shoulder pain, initial encounter EXAM: LEFT SHOULDER COMPARISON:  None Available. FINDINGS: Mild degenerative changes of the acromioclavicular joint are seen. No acute fracture or dislocation is noted. No soft tissue abnormality is seen. Degenerative changes of the glenohumeral joint are seen as well. IMPRESSION: Degenerative change without acute abnormality. Electronically Signed   By: Alcide Clever M.D.   On: 01/04/2023 01:19   DG Chest Portable 1 View  Result Date: 01/04/2023 CLINICAL DATA:  Recent fall with head injury, initial encounter EXAM: PORTABLE CHEST 1 VIEW COMPARISON:  10/05/2022 FINDINGS: Cardiac shadow is enlarged but stable. Lungs are well aerated bilaterally. No focal infiltrate is seen. Mild central vascular congestion is noted. Postsurgical changes in the right axilla are seen. IMPRESSION: Mild vascular congestion without edema. Electronically Signed   By: Alcide Clever M.D.   On: 01/04/2023 01:19   DG Pelvis Portable  Result Date: 01/04/2023 CLINICAL DATA:  Recent fall with pelvic pain, initial encounter EXAM: PORTABLE PELVIS 1 VIEWS COMPARISON:  None Available. FINDINGS: Pelvic ring is intact. No acute fracture or dislocation is seen. No soft tissue abnormality is noted. IMPRESSION: No acute abnormality noted. Electronically Signed   By: Alcide Clever M.D.   On: 01/04/2023 01:18     Subjective: Patient was seen and examined at bedside.  Overnight events noted.  Patient reports doing much better and wants to be discharged.  Patient is being discharged home.  Discharge  Exam: Vitals:   01/05/23 0739 01/05/23 0835  BP: (!) 98/45   Pulse: 69   Resp: 17   Temp: 98.3 F (36.8 C)   SpO2: 100% 99%   Vitals:   01/04/23 1242 01/04/23 2019 01/05/23 0739 01/05/23 0835  BP: (!) 96/52 (!) 119/56 (!) 98/45   Pulse: 71 65 69   Resp: 18  17   Temp: 98.2 F (36.8 C) 98.4 F (36.9 C) 98.3 F (36.8 C)   TempSrc: Oral Oral Oral   SpO2: 100% 100% 100% 99%    General: Pt is alert, awake, not in acute distress Cardiovascular: RRR, S1/S2 +, no rubs, no gallops Respiratory: CTA bilaterally, no wheezing, no rhonchi Abdominal: Soft, NT, ND, bowel sounds + Extremities: no edema, no cyanosis    The results of significant diagnostics from this hospitalization (including imaging, microbiology, ancillary and laboratory) are listed below for reference.     Microbiology: No results found for this or any previous visit (from the past 240 hour(s)).   Labs: BNP (last 3 results) No results for input(s): "BNP" in the last 8760 hours. Basic Metabolic Panel: Recent Labs  Lab 01/04/23 0108 01/04/23 0111 01/05/23 0804  NA 136 134* 136  K 4.8 4.8 3.7  CL 98 97* 100  CO2  --  27 24  GLUCOSE 124* 124* 165*  BUN 28* 27* 15  CREATININE 1.70* 1.71* 0.94  CALCIUM  --  9.3 9.0   Liver Function Tests: No results for input(s): "AST", "ALT", "ALKPHOS", "BILITOT", "PROT", "ALBUMIN" in the last 168 hours. No results for input(s): "LIPASE", "AMYLASE" in the last 168 hours. No results for input(s): "AMMONIA" in the last 168 hours. CBC: Recent Labs  Lab 01/04/23 0108 01/04/23 0111 01/05/23 0804  WBC  --  6.9 5.5  NEUTROABS  --  4.8  --   HGB 12.9 11.6* 10.7*  HCT 38.0 37.1 34.3*  MCV  --  89.8 89.6  PLT  --  248 218   Cardiac Enzymes: No results for input(s): "CKTOTAL", "CKMB", "CKMBINDEX", "TROPONINI" in the last 168 hours. BNP: Invalid input(s): "POCBNP" CBG: Recent Labs  Lab 01/04/23 1555 01/04/23 1630 01/04/23 2209 01/05/23 0736 01/05/23 1117  GLUCAP  106* 117* 99 124* 92   D-Dimer No results for input(s): "DDIMER" in the last 72 hours. Hgb A1c Recent Labs    01/04/23 0111  HGBA1C 7.1*   Lipid Profile No results for input(s): "CHOL", "HDL", "LDLCALC", "TRIG", "CHOLHDL", "LDLDIRECT" in the last 72 hours. Thyroid function studies Recent Labs    01/04/23 0300  TSH 1.133   Anemia work up No results for input(s): "VITAMINB12", "FOLATE", "FERRITIN", "TIBC", "IRON", "RETICCTPCT" in the last 72 hours. Urinalysis    Component Value Date/Time   COLORURINE COLORLESS (A) 01/04/2023 0152   APPEARANCEUR CLEAR 01/04/2023 0152   LABSPEC 1.012 01/04/2023 0152   PHURINE 7.0 01/04/2023 0152   GLUCOSEU NEGATIVE 01/04/2023 0152   GLUCOSEU NEGATIVE 12/16/2019 0916   HGBUR NEGATIVE 01/04/2023 0152   BILIRUBINUR NEGATIVE 01/04/2023 0152   BILIRUBINUR Negative 10/05/2022 1018   KETONESUR NEGATIVE 01/04/2023 0152   PROTEINUR NEGATIVE 01/04/2023 0152   UROBILINOGEN 0.2 10/05/2022 1018   UROBILINOGEN 0.2 12/16/2019 0916   NITRITE NEGATIVE 01/04/2023 0152   LEUKOCYTESUR NEGATIVE 01/04/2023 0152   Sepsis Labs Recent Labs  Lab 01/04/23 0111 01/05/23 0804  WBC 6.9 5.5   Microbiology No results found for  this or any previous visit (from the past 240 hour(s)).   Time coordinating discharge: Over 30 minutes  SIGNED:   Willeen Niece, MD  [facility] 01/05/2023, 1:39 PM Pager   If 7PM-7AM, please contact night-coverage

## 2023-01-05 NOTE — Telephone Encounter (Signed)
Thanks for making [place] aware.  Will continue to follow.

## 2023-01-09 ENCOUNTER — Ambulatory Visit (INDEPENDENT_AMBULATORY_CARE_PROVIDER_SITE_OTHER): Payer: Medicare Other

## 2023-01-09 DIAGNOSIS — Z7901 Long term (current) use of anticoagulants: Secondary | ICD-10-CM | POA: Diagnosis not present

## 2023-01-09 LAB — POCT INR: INR: 1.5 — AB (ref 2.0–3.0)

## 2023-01-09 NOTE — Progress Notes (Signed)
Pt admitted to hospital for fall on 6/5 and d/c on 6/7. Reports fall due to dehydration from diurtetics causing dizziness. Stopped lasix, spironolactone and acarbose in hospital. She will discuss these medications with PCP

## 2023-01-09 NOTE — Progress Notes (Signed)
Made hospital f/u apt for pt with PCP for Monday, 6/17. PT has been eating a lot of salads in and out of the hospital.   Increase dose today to take 3 tablets and increase dose tomorrow to take 1 1/2 tablets and then continue  2 tablets daily except take 1 tablet on Wednesdays. Recheck in 2 weeks.

## 2023-01-09 NOTE — Patient Instructions (Addendum)
Pre visit review using our clinic review tool, if applicable. No additional management support is needed unless otherwise documented below in the visit note.  Increase dose today to take 3 tablets and increase dose tomorrow to take 1 1/2 tablets and then continue  2 tablets daily except take 1 tablet on Wednesdays. Recheck in 2 weeks.

## 2023-01-11 ENCOUNTER — Other Ambulatory Visit (INDEPENDENT_AMBULATORY_CARE_PROVIDER_SITE_OTHER): Payer: Medicare Other

## 2023-01-11 DIAGNOSIS — E063 Autoimmune thyroiditis: Secondary | ICD-10-CM | POA: Diagnosis not present

## 2023-01-11 DIAGNOSIS — K912 Postsurgical malabsorption, not elsewhere classified: Secondary | ICD-10-CM | POA: Diagnosis not present

## 2023-01-11 DIAGNOSIS — E7800 Pure hypercholesterolemia, unspecified: Secondary | ICD-10-CM | POA: Diagnosis not present

## 2023-01-11 LAB — LIPID PANEL
Cholesterol: 206 mg/dL — ABNORMAL HIGH (ref 0–200)
HDL: 79.3 mg/dL (ref >39.00)
LDL Cholesterol: 113 mg/dL — ABNORMAL HIGH (ref 0–99)
NonHDL: 126.2
Total CHOL/HDL Ratio: 3
Triglycerides: 64 mg/dL (ref 0.0–149.0)
VLDL: 12.8 mg/dL (ref 0.0–40.0)

## 2023-01-11 LAB — TSH: TSH: 5.31 u[IU]/mL (ref 0.35–5.50)

## 2023-01-11 LAB — BASIC METABOLIC PANEL
BUN: 24 mg/dL — ABNORMAL HIGH (ref 6–23)
CO2: 29 mEq/L (ref 19–32)
Calcium: 9.3 mg/dL (ref 8.4–10.5)
Chloride: 98 mEq/L (ref 96–112)
Creatinine, Ser: 1.05 mg/dL (ref 0.40–1.20)
GFR: 52.13 mL/min — ABNORMAL LOW (ref >60.00)
Glucose, Bld: 121 mg/dL — ABNORMAL HIGH (ref 70–99)
Potassium: 4.1 mEq/L (ref 3.5–5.1)
Sodium: 137 mEq/L (ref 135–145)

## 2023-01-11 LAB — HEMOGLOBIN A1C: Hgb A1c MFr Bld: 7 % — ABNORMAL HIGH (ref 4.6–6.5)

## 2023-01-11 LAB — T4, FREE: Free T4: 1.07 ng/dL (ref 0.60–1.60)

## 2023-01-15 ENCOUNTER — Encounter: Payer: Self-pay | Admitting: Family Medicine

## 2023-01-15 ENCOUNTER — Ambulatory Visit (INDEPENDENT_AMBULATORY_CARE_PROVIDER_SITE_OTHER): Payer: Medicare Other | Admitting: Family Medicine

## 2023-01-15 VITALS — BP 118/70 | HR 55 | Temp 98.5°F | Wt 217.6 lb

## 2023-01-15 DIAGNOSIS — R600 Localized edema: Secondary | ICD-10-CM | POA: Diagnosis not present

## 2023-01-15 DIAGNOSIS — W19XXXD Unspecified fall, subsequent encounter: Secondary | ICD-10-CM

## 2023-01-15 DIAGNOSIS — Z7901 Long term (current) use of anticoagulants: Secondary | ICD-10-CM | POA: Diagnosis not present

## 2023-01-15 DIAGNOSIS — I10 Essential (primary) hypertension: Secondary | ICD-10-CM

## 2023-01-15 DIAGNOSIS — H409 Unspecified glaucoma: Secondary | ICD-10-CM | POA: Diagnosis not present

## 2023-01-15 DIAGNOSIS — I890 Lymphedema, not elsewhere classified: Secondary | ICD-10-CM

## 2023-01-15 DIAGNOSIS — N179 Acute kidney failure, unspecified: Secondary | ICD-10-CM | POA: Diagnosis not present

## 2023-01-15 MED ORDER — FUROSEMIDE 40 MG PO TABS
40.0000 mg | ORAL_TABLET | Freq: Two times a day (BID) | ORAL | 0 refills | Status: AC
Start: 2023-01-15 — End: ?

## 2023-01-15 MED ORDER — FUROSEMIDE 40 MG PO TABS
40.0000 mg | ORAL_TABLET | Freq: Two times a day (BID) | ORAL | 3 refills | Status: DC
Start: 2023-01-15 — End: 2024-01-04

## 2023-01-15 NOTE — Patient Instructions (Signed)
Prescription for Lasix 40 mg twice a day was sent to your local pharmacy into the mail order pharmacy can send the prescription to you.  We may need to adjust the dose further depending on your swelling.

## 2023-01-15 NOTE — Progress Notes (Signed)
Established Patient Office Visit   Subjective  Patient ID: Kelli Martinez, female    DOB: [birth year]  Age: 77 y.o. MRN: [id number]  Chief Complaint  Patient presents with   Medical Management of Chronic Issues    Had a fall on 6/6,not sure how she fell. Was getting ready to get in bed, felt tired and weak, staggering, felt like she had to use the bathroom. When turned to go to bathroom, started to fall backwards but ended up on stomach. Feels pain uner left breast area and sore. States it feels like a cold ache.  ED stopped lasix, spironolactone, and a diabetic medicine. Stated sh    Patient is a 77 year old female seen for HFU.  Last 6-6/7 for fall after leaning on the bed and trying to turn to get to the restroom.  Patient did not hit her head.  Patient found to have AKI due to overdiuresis.  Creatinine 1.71.  Spironolactone and Lasix held upon discharge however patient restarted the next day.  Patient states she is doing well.  Still having soreness in left breast and underneath left breast at lateral ribs since the fall.  Bruising has resolved.  Patient restarted Lasix 80 mg in a.m. and 40 mg in p.m.  Swelling in legs is improved in the past.  Not currently having episodes of increased fatigue.  States had labs last week appointment with Dr. Lucianne Muss tomorrow.  Patient notes glaucoma and right eye worsening stable.  Using eyedrops.  Has ophthalmology appointment 6/25.  Can tell a difference in vision.  Muscle spasms and cramping have improved, better with exercise.  Taking potassium 3 times daily.  Hands will spasm occasionally with activity such as cooking.  Will drink a little vinegar and water for hand cramping if bad.  Patient previously did well in [facility].   When asked if he wanted to restart stated is supposed to move in with her daughter and sell her home in July and would rather wait.    Past Medical History:  Diagnosis Date   Allergy    Anemia    Arthritis    Asthma     Back pain, chronic    GETS INJECTIONS IN BACK   Blind right eye    hemorrhage   Blood transfusion    Cancer (HCC)    breast 1994   Clotting disorder (HCC)    Diabetes mellitus without complication (HCC)    takes precose   Elevated liver enzymes    Gallstones    GERD (gastroesophageal reflux disease)    HX: breast cancer    Hyperlipidemia    Hypertension    Hypothyroid    Lymphedema of arm    RT   Morbid obesity (HCC)    OSA (obstructive sleep apnea)    has not used in 2 years-lost weight   Osteoporosis    Peripheral neuropathy    on gabapentin   PONV (postoperative nausea and vomiting)    Psoriasis    Pulmonary embolism (HCC) 1998 / 1994 /1968   Syncope and collapse 2011   due to anemia   Past Surgical History:  Procedure Laterality Date   ABDOMINAL HYSTERECTOMY     BONE MARROW TRANSPLANT  1994   CARDIOVASCULAR STRESS TEST  05/23/2005   EF 53%   carpal tunnell     bil   cataracts     CHOLECYSTECTOMY  05/10/2012   Procedure: LAPAROSCOPIC CHOLECYSTECTOMY WITH INTRAOPERATIVE CHOLANGIOGRAM;  Surgeon: Valarie Merino, MD;  Location: [facility];  Service: General;  Laterality: N/A;  Laparoscopic Cholecystectomy with Intraoperative Cholangiogram   GASTRIC BYPASS  2011   bariatric surgery   HIATAL HERNIA REPAIR     IVC filter     recurrent DVT   KNEE ARTHROTOMY  1998   MASS EXCISION Right 11/24/2016   Procedure: EXCISION RIGHT AXILLARY LESION AND CHEST WALL LESION;  Surgeon: Luretha Murphy, MD;  Location: [facility];  Service: General;  Laterality: Right;   MASTECTOMY     Right   US ECHOCARDIOGRAPHY  10/27/08   EF 55-60%   Social History   Tobacco Use   Smoking status: Former    Packs/day: 0.50    Years: 10.00    Additional pack years: 0.00    Total pack years: 5.00    Types: Cigarettes    Quit date: 07/31/1978    Years since quitting: 44.4   Smokeless tobacco: Never  Vaping Use   Vaping Use: Never used  Substance Use Topics   Alcohol use: No    Drug use: No   Family History  Problem Relation Age of Onset   Sudden death Mother        car accident   Cancer Father 101       lung   Cancer Sister 11       lung cancer   Asthma Daughter    Colon cancer Neg Hx    Stomach cancer Neg Hx    Esophageal cancer Neg Hx    Colon polyps Neg Hx    Allergies  Allergen Reactions   Adhesive [Tape] Rash      ROS Negative unless stated above    Objective:     BP 118/70 (BP Location: Left Arm, Patient Position: Sitting, Cuff Size: Large)   Pulse (!) 55   Temp 98.5 F (36.9 C) (Oral)   Wt 217 lb 9.6 oz (98.7 kg)   SpO2 98%   BMI 33.58 kg/m  BP Readings from Last 3 Encounters:  01/15/23 118/70  01/05/23 (!) 98/45  10/05/22 122/70   Wt Readings from Last 3 Encounters:  01/15/23 217 lb 9.6 oz (98.7 kg)  10/13/22 217 lb (98.4 kg)  10/05/22 219 lb (99.3 kg)      Physical Exam Constitutional:      General: She is not in acute distress.    Appearance: Normal appearance.  HENT:     Head: Normocephalic and atraumatic.     Nose: Nose normal.     Mouth/Throat:     Mouth: Mucous membranes are moist.  Cardiovascular:     Rate and Rhythm: Normal rate and regular rhythm.     Heart sounds: Normal heart sounds. No murmur heard.    No gallop.     Comments: Bilateral LE edema L>R.  Mild chronic lymphedema skin changes. Pulmonary:     Effort: Pulmonary effort is normal. No respiratory distress.     Breath sounds: Normal breath sounds. No wheezing, rhonchi or rales.  Musculoskeletal:     Right lower leg: 1+ Pitting Edema present.     Left lower leg: 1+ Pitting Edema present.  Skin:    General: Skin is warm and dry.  Neurological:     Mental Status: She is alert and oriented to person, place, and time.      No results found for any visits on 01/15/23.    Assessment & Plan:  Acute kidney injury -Creatinine 1.71=> 0.94 at time of discharge -Labs obtained last week  prior to visit office visit with Dr. Lucianne Muss, creatinine  1.05 -Discussed the importance of increasing p.o. intake of water and fluids -Will decrease Lasix dose to 40 mg twice daily, down from 80 mg in a.m. and 40 mg in p.m.  May need to further adjust dose based on degree bilateral LE edema.  Lymphedema -     Furosemide; Take 1 tablet (40 mg total) by mouth 2 (two) times daily.  Dispense: 180 tablet; Refill: 3 -     Furosemide; Take 1 tablet (40 mg total) by mouth 2 (two) times daily.  Dispense: 60 tablet; Refill: 0  Bilateral lower extremity edema -     Furosemide; Take 1 tablet (40 mg total) by mouth 2 (two) times daily.  Dispense: 180 tablet; Refill: 3 -     Furosemide; Take 1 tablet (40 mg total) by mouth 2 (two) times daily.  Dispense: 60 tablet; Refill: 0  Essential hypertension  Fall, subsequent encounter  Glaucoma of both eyes, unspecified glaucoma type  Chronic anticoagulation -Continue chronic Coumadin use 2/2 history of PE  BP well-controlled.  Continue current medications.  Will decrease dose of Lasix to 40 mg twice daily given recent AKI.  Rx for Lasix sent to mail order pharmacy and local pharmacy until delivered.  Initial creatinine during hospitalization 1.71, then improved to 0.94.  Per chart review BMP on 01/11/2023 with creatinine 1.05.  Continue potassium supplement.  Discussed fall prevention.  Continue eyedrops and follow-up with ophthalmology for glaucoma.  Discussed the importance of daily MVI given history of Roux-en-Y.  Continue follow-up with [place] [facility].  Return in about 10 weeks (around 03/26/2023).,  Sooner if needed.  Deeann Saint, MD

## 2023-01-16 ENCOUNTER — Ambulatory Visit (INDEPENDENT_AMBULATORY_CARE_PROVIDER_SITE_OTHER): Payer: Medicare Other | Admitting: Endocrinology

## 2023-01-16 VITALS — BP 117/60 | HR 54 | Ht 68.0 in | Wt 219.4 lb

## 2023-01-16 DIAGNOSIS — E063 Autoimmune thyroiditis: Secondary | ICD-10-CM

## 2023-01-16 DIAGNOSIS — E7800 Pure hypercholesterolemia, unspecified: Secondary | ICD-10-CM

## 2023-01-16 DIAGNOSIS — E1142 Type 2 diabetes mellitus with diabetic polyneuropathy: Secondary | ICD-10-CM | POA: Diagnosis not present

## 2023-01-16 DIAGNOSIS — K912 Postsurgical malabsorption, not elsewhere classified: Secondary | ICD-10-CM | POA: Diagnosis not present

## 2023-01-16 MED ORDER — LEVOTHYROXINE SODIUM 112 MCG PO TABS: 112.0000 ug | ORAL_TABLET | Freq: Every day | ORAL | 3 refills | Status: DC

## 2023-01-16 NOTE — Patient Instructions (Signed)
Aldactone 50mg  daily

## 2023-01-16 NOTE — Progress Notes (Signed)
Patient ID: Kelli Martinez, female   DOB: 1946/03/03, 77 y.o.   MRN: [id number]    Chief complaint: Followup of various chronic problems    History of Present Illness:  She is here for follow-up:  1.  Postprandial hypoglycemia related to dumping syndrome   She has had postprandial hypoglycemia starting a couple of years after her gastric bypass surgery Her symptoms of blood sugars are mostly a feeling of significant weakness, some shakiness.  She will use glucose tablets or juice in order to relieve the symptoms, recently she thinks that 1 or 2 glucose tablets are usually adequate She has been to the dietitian and has been instructed on balanced low-fat meals with enough protein consistently and restricting carbohydrates including fruits   Recent history:  She has been treated with acarbose 50 mg before meals She did try verapamil previously without benefit   Has a history of relatively low sugars at times after lunch usually related to eating more carbohydrates such as a full sandwich, usually has symptoms with low sugars which are mostly weakness However recently she does not complain of any episodes of hypoglycemia and did not bring her monitor for download She has been regular with her Precose She is still eating yogurt in the morning for breakfast   Metformin was stopped previously because of hypoglycemia tendency   2.  SWELLING of the legs:  She has had significant problems with her legs and feet chronically She has been followed by PCP and by cardiologist  Taking Aldactone 100 mg daily  Also taking Lasix 40 mg bid and managed by PCP, previously was taking higher doses prior to her admission for overdiuresis and renal insufficiency  Usually she does not like to wear compression stockings because of discomfort   Renal function and potassium back to normal  Lab Results  Component Value Date   K 4.1 01/11/2023   Lab Results  Component Value  Date   CREATININE 1.05 01/11/2023   CREATININE 0.94 01/05/2023   CREATININE 1.71 (H) 01/04/2023     PROBLEM 3:  DIABETES:  This has been mild and well controlled since her gastric bypass surgery  The A1c usually higher than expected for her blood sugars, again 7%  Current management, blood sugar patterns: She has only been on acarbose for treatment to prevent hypoglycemia  Home blood sugars are being done erratically, she thinks they are in the 90s and at the most about 118 at different times but usually not after meals, rarely blood sugar may be about 140 after eating Still unable to exercise because of back and leg pains Usually eating yogurt for breakfast and blood sugar after that the lab was 121 She has been told to make sure she has less carbohydrate and more protein at each meal Has not been able to lose any weight recently  She was previously told to stop her Metformin  because of tendency to hypoglycemia symptoms     Wt Readings from Last 3 Encounters:  01/16/23 219 lb 6.4 oz (99.5 kg)  01/15/23 217 lb 9.6 oz (98.7 kg)  10/13/22 217 lb (98.4 kg)    Lab Results  Component Value Date   HGBA1C 7.0 (H) 01/11/2023   HGBA1C 7.1 (H) 01/04/2023   HGBA1C 7.0 (H) 09/12/2022   Lab Results  Component Value Date   MICROALBUR <0.7 04/26/2021   LDLCALC 113 (H) 01/11/2023   CREATININE 1.05 01/11/2023   Lab Results  Component Value Date   HGB  10.7 (L) 01/05/2023      Other active problems: see review of systems     Allergies as of 01/16/2023       Reactions   Adhesive [tape] Rash        Medication List        Accurate as of January 16, 2023 11:32 AM. If you have any questions, ask your nurse or doctor.          albuterol 108 (90 Base) MCG/ACT inhaler Commonly known as: ProAir HFA Inhale 2 puffs into the lungs every 6 (six) hours as needed for wheezing or shortness of breath.   baclofen 20 MG tablet Commonly known as: LIORESAL TAKE 1 TABLET THREE  TIMES A DAY (UPDATED PRESCRIPTION) What changed: See the new instructions.   CALCITRATE PO Take 1 tablet by mouth daily.   CALTRATE BONE HEALTH PO Take 1 tablet by mouth daily.   clobetasol cream 0.05 % Commonly known as: TEMOVATE APPLY 1 APPLICATION TOPICALLY TWICE A DAY What changed: See the new instructions.   dorzolamide-timolol 2-0.5 % ophthalmic solution Commonly known as: COSOPT Place 1 drop into the left eye 2 (two) times daily.   fluticasone 50 MCG/ACT nasal spray Commonly known as: FLONASE USE 2 SPRAYS IN EACH NOSTRIL DAILY What changed:  how much to take when to take this   fluticasone-salmeterol 100-50 MCG/ACT Aepb Commonly known as: Advair Diskus USE 1 INHALATION EVERY 12 HOURS, RINSE MOUTH AFTER USING What changed:  how much to take how to take this when to take this additional instructions   FreeStyle Freedom Lite w/Device Kit Use as directed to check blood sugar once daily.   freestyle lancets USE AS INSTRUCTED TO CHECK BLOOD SUGAR ONCE DAILY   FREESTYLE LITE test strip Generic drug: glucose blood USE AS INSTRUCTED TO CHECK BLOOD SUGAR ONCE DAILY   furosemide 40 MG tablet Commonly known as: LASIX Take 1 tablet (40 mg total) by mouth 2 (two) times daily.   furosemide 40 MG tablet Commonly known as: LASIX Take 1 tablet (40 mg total) by mouth 2 (two) times daily.   gabapentin 600 MG tablet Commonly known as: NEURONTIN TAKE 1 TABLET THREE TIMES A DAY (DOSE CHANGE) What changed: See the new instructions.   HYDROcodone-acetaminophen 5-325 MG tablet Commonly known as: Norco Take 1 tablet by mouth 2 (two) times daily as needed for moderate pain. What changed:  how much to take reasons to take this   levothyroxine 100 MCG tablet Commonly known as: SYNTHROID TAKE 1 TABLET DAILY BEFORE BREAKFAST (NEED WHEN 88 MCG OUT) What changed: See the new instructions.   LIQUID TEARS OP Place 1 drop into the right eye 2 (two) times daily.   Magnesium  250 MG Tabs Take 250 mg by mouth 2 (two) times daily.   omeprazole 40 MG capsule Commonly known as: PRILOSEC Take 1 capsule (40 mg total) by mouth daily.   potassium chloride SA 20 MEQ tablet Commonly known as: KLOR-CON M Take 2 tablets (40 mEq total) by mouth 2 (two) times daily. What changed:  how much to take when to take this   pravastatin 10 MG tablet Commonly known as: PRAVACHOL TAKE 1 TABLET DAILY   temazepam 30 MG capsule Commonly known as: RESTORIL 1-2 for sleep as needed What changed:  how much to take how to take this when to take this reasons to take this additional instructions   TruForm Arm Sleeve L 15-92mmHg Misc 1 Device by Does not apply route as directed.  ursodiol 300 MG capsule Commonly known as: ACTIGALL Take 2 capsules (600 mg total) by mouth 2 (two) times daily.   VITAMIN B-12 PO Take 1 tablet by mouth every other day.   Vitamin D (Ergocalciferol) 50000 units Caps Take 1 capsule by mouth once a week. What changed:  how much to take when to take this   warfarin 5 MG tablet Commonly known as: COUMADIN Take as directed by the anticoagulation clinic. If you are unsure how to take this medication, talk to your nurse or doctor. Original instructions: TAKE 2 TABLETS DAILY EXCEPT TAKE 1 TABLET ON WEDNESDAYS AND SATURDAYS OR TAKE AS DIRECTED BY ANTICOAGULATION CLINIC What changed:  how much to take how to take this when to take this additional instructions        Allergies:  Allergies  Allergen Reactions   Adhesive [Tape] Rash    Past Medical History:  Diagnosis Date   Allergy    Anemia    Arthritis    Asthma    Back pain, chronic    GETS INJECTIONS IN BACK   Blind right eye    hemorrhage   Blood transfusion    Cancer (HCC)    breast 1994   Clotting disorder (HCC)    Diabetes mellitus without complication (HCC)    takes precose   Elevated liver enzymes    Gallstones    GERD (gastroesophageal reflux disease)    HX:  breast cancer    Hyperlipidemia    Hypertension    Hypothyroid    Lymphedema of arm    RT   Morbid obesity (HCC)    OSA (obstructive sleep apnea)    has not used in 2 years-lost weight   Osteoporosis    Peripheral neuropathy    on gabapentin   PONV (postoperative nausea and vomiting)    Psoriasis    Pulmonary embolism (HCC) 1998 / 1994 /1968   Syncope and collapse 2011   due to anemia    Past Surgical History:  Procedure Laterality Date   ABDOMINAL HYSTERECTOMY     BONE MARROW TRANSPLANT  1994   CARDIOVASCULAR STRESS TEST  05/23/2005   EF 53%   carpal tunnell     bil   cataracts     CHOLECYSTECTOMY  05/10/2012   Procedure: LAPAROSCOPIC CHOLECYSTECTOMY WITH INTRAOPERATIVE CHOLANGIOGRAM;  Surgeon: Valarie Merino, MD;  Location: [facility];  Service: General;  Laterality: N/A;  Laparoscopic Cholecystectomy with Intraoperative Cholangiogram   GASTRIC BYPASS  2011   bariatric surgery   HIATAL HERNIA REPAIR     IVC filter     recurrent DVT   KNEE ARTHROTOMY  1998   MASS EXCISION Right 11/24/2016   Procedure: EXCISION RIGHT AXILLARY LESION AND CHEST WALL LESION;  Surgeon: Luretha Murphy, MD;  Location: [facility];  Service: General;  Laterality: Right;   MASTECTOMY     Right   US ECHOCARDIOGRAPHY  10/27/08   EF 55-60%    Family History  Problem Relation Age of Onset   Sudden death Mother        car accident   Cancer Father 4       lung   Cancer Sister 34       lung cancer   Asthma Daughter    Colon cancer Neg Hx    Stomach cancer Neg Hx    Esophageal cancer Neg Hx    Colon polyps Neg Hx     Social History:  reports that she quit smoking  about 44 years ago. Her smoking use included cigarettes. She has a 5.00 pack-year smoking history. She has never used smokeless tobacco. She reports that she does not drink alcohol and does not use drugs.  Review of Systems    ABNORMAL liver functions:  Previously has had abnormal high ALT readings before  transiently and no etiology found  Not related to statins   Lab Results  Component Value Date   ALT 17 09/12/2022   ALT 25 01/19/2016     Neuropathy with persistent burning, mild numbness and lower leg pain:  She gets relief with nortriptyline and gabapentin  MUSCLE cramps/spasms:  These are treated with 10 mg baclofen during the day twice daily and 20 mg at bedtime She also uses tonic water, mustard and magnesium supplements  No history of hypokalemia  History of mild hypothyroidism: Has been present for several years and minimally symptomatic usually  Has been on the same dose of levothyroxine of 100 mcg She has taken her calcium separately in the afternoon as directed She does complain of feeling fatigued which is not unusual However TSH higher than usual  TSH history:   Lab Results  Component Value Date   TSH 5.31 01/11/2023   TSH 1.133 01/04/2023   TSH 4.57 09/12/2022   FREET4 1.07 01/11/2023   FREET4 1.01 05/04/2022   FREET4 1.05 07/08/2021     Hypercholesterolemia: Treated by PCP with pravastatin 10mg   Although LDL is higher than 100 now her PCP does not recommend increasing the dose, patient thinks she is eating a lot of shrimp  Labs as below  Lipid levels:  Lab Results  Component Value Date   CHOL 206 (H) 01/11/2023   CHOL 210 (H) 05/04/2022   CHOL 193 08/29/2021   Lab Results  Component Value Date   HDL 79.30 01/11/2023   HDL 104.30 05/04/2022   HDL 92.70 08/29/2021   Lab Results  Component Value Date   LDLCALC 113 (H) 01/11/2023   LDLCALC 94 05/04/2022   LDLCALC 90 08/29/2021   Lab Results  Component Value Date   TRIG 64.0 01/11/2023   TRIG 55.0 05/04/2022   TRIG 52.0 08/29/2021   Lab Results  Component Value Date   CHOLHDL 3 01/11/2023   CHOLHDL 2 05/04/2022   CHOLHDL 2 08/29/2021   No results found for: "LDLDIRECT"  Lab Results  Component Value Date   ALT 17 09/12/2022   ALT 25 01/19/2016    EXAM:  BP 117/60 (BP  Location: Left Arm, Patient Position: Sitting, Cuff Size: Large)   Pulse (!) 54   Ht 5\' 8"  (1.727 m)   Wt 219 lb 6.4 oz (99.5 kg)   SpO2 96%   BMI 33.36 kg/m    Assessment/Plan:   1.  HYPOTHYROIDISM: TSH is high normal and since she may benefit from increasing the dose slightly because of her fatigue she will go up to 112 mcg levothyroxine New prescription sent She will make sure she takes it every morning on empty stomach and avoid taking calcium at the same time her levothyroxine   2. Reactive hypoglycemia secondary to gastrointestinal surgery:   Previously had symptoms after lunch but she thinks she is asymptomatic now Likely doing better with carbohydrate restriction and getting protein with every meal and snack  She needs to eat a snack between meals especially getting late for meals Continue acarbose with each meal as before  3. Mild diabetes: A1c 7  A1c is higher than expected for her blood sugars  which are near normal Will continue acarbose only, she is not wanting to consider additional medications although may benefit from SGLT2 drugs  4.  Diuretic use: This is primarily related to venous edema of her legs and suggested that she try only 50 mg of Aldactone instead of 100 to see if it would work just as well  5.  Hypercholesterolemia: Will defer management to PCP, likely needs higher dose of pravastatin than 10 mg  There are no Patient Instructions on file for this visit.  [name] 01/16/2023, 11:32 AM

## 2023-01-23 ENCOUNTER — Ambulatory Visit (INDEPENDENT_AMBULATORY_CARE_PROVIDER_SITE_OTHER): Payer: Medicare Other

## 2023-01-23 DIAGNOSIS — Z7901 Long term (current) use of anticoagulants: Secondary | ICD-10-CM

## 2023-01-23 DIAGNOSIS — Z961 Presence of intraocular lens: Secondary | ICD-10-CM | POA: Diagnosis not present

## 2023-01-23 DIAGNOSIS — H401123 Primary open-angle glaucoma, left eye, severe stage: Secondary | ICD-10-CM | POA: Diagnosis not present

## 2023-01-23 LAB — POCT INR: INR: 1.8 — AB (ref 2.0–3.0)

## 2023-01-23 NOTE — Progress Notes (Signed)
Increase dose today to take 3 tablets and then continue  2 tablets daily. Recheck in 2 weeks.

## 2023-01-23 NOTE — Patient Instructions (Addendum)
Pre visit review using our clinic review tool, if applicable. No additional management support is needed unless otherwise documented below in the visit note.  Increase dose today to take 3 tablets and then continue  2 tablets daily. Recheck in 2 weeks.

## 2023-01-26 ENCOUNTER — Ambulatory Visit: Payer: Medicare Other

## 2023-02-09 ENCOUNTER — Ambulatory Visit: Payer: Medicare Other

## 2023-02-13 ENCOUNTER — Ambulatory Visit: Payer: Medicare Other

## 2023-02-13 DIAGNOSIS — M5416 Radiculopathy, lumbar region: Secondary | ICD-10-CM | POA: Diagnosis not present

## 2023-02-13 DIAGNOSIS — M4316 Spondylolisthesis, lumbar region: Secondary | ICD-10-CM | POA: Diagnosis not present

## 2023-02-13 DIAGNOSIS — Z7901 Long term (current) use of anticoagulants: Secondary | ICD-10-CM | POA: Diagnosis not present

## 2023-02-13 DIAGNOSIS — M5030 Other cervical disc degeneration, unspecified cervical region: Secondary | ICD-10-CM | POA: Diagnosis not present

## 2023-02-13 DIAGNOSIS — M5136 Other intervertebral disc degeneration, lumbar region: Secondary | ICD-10-CM | POA: Diagnosis not present

## 2023-02-13 DIAGNOSIS — Z79891 Long term (current) use of opiate analgesic: Secondary | ICD-10-CM | POA: Diagnosis not present

## 2023-02-13 LAB — POCT INR: INR: 2.9 (ref 2.0–3.0)

## 2023-02-13 NOTE — Patient Instructions (Addendum)
Pre visit review using our clinic review tool, if applicable. No additional management support is needed unless otherwise documented below in the visit note.  Continue  2 tablets daily. Recheck in 5 weeks.

## 2023-02-13 NOTE — Progress Notes (Signed)
Continue  2 tablets daily. Recheck in 5 weeks.

## 2023-02-26 ENCOUNTER — Other Ambulatory Visit: Payer: Self-pay | Admitting: Family Medicine

## 2023-02-28 ENCOUNTER — Encounter (INDEPENDENT_AMBULATORY_CARE_PROVIDER_SITE_OTHER): Payer: Self-pay

## 2023-02-28 ENCOUNTER — Telehealth: Payer: Self-pay | Admitting: Internal Medicine

## 2023-03-02 NOTE — Telephone Encounter (Signed)
Attempted to contact [facility], recording states they are currently closed and call was disconnected.   [facility] Monday

## 2023-03-02 NOTE — Telephone Encounter (Signed)
Thanks ok to refill temazepam

## 2023-03-05 NOTE — Telephone Encounter (Signed)
Spoke with [employer]. The refill for Temazepam has been canceled due to the delay in response from provider office.   Per pharmacy tech a new rx can be sent, must have note indicating approval from provider to fill additional sedating medication.  Per [facility]: patient is currently on Gabapentin 600mg  1 tab TID AND Norco 5-325 1 tab Q8H PRN (last filled 02/13/23, #120)   They want to make sure provider is aware of the above medications and the possibility for polypharmacy. They recommend changing Temazepam sig to 1 TAB QHS PRN, current states 1-2 tabs.  Please advise, thank you!

## 2023-03-06 MED ORDER — TEMAZEPAM 15 MG PO CAPS: 15.0000 mg | ORAL_CAPSULE | Freq: Every evening | ORAL | 1 refills | Status: DC | PRN

## 2023-03-06 NOTE — Telephone Encounter (Signed)
Per pharmacy recommendation, I have reduced the temazepam strength to 15 mg because of her age and her other sedating medications. This is for safety.

## 2023-03-12 ENCOUNTER — Telehealth: Payer: Self-pay

## 2023-03-12 DIAGNOSIS — Z7901 Long term (current) use of anticoagulants: Secondary | ICD-10-CM

## 2023-03-12 MED ORDER — WARFARIN SODIUM 5 MG PO TABS
ORAL_TABLET | ORAL | 3 refills | Status: DC
Start: 2023-03-12 — End: 2024-02-12

## 2023-03-12 NOTE — Telephone Encounter (Signed)
Received fax from [facility] requesting refill of warfarin.   Pt is compliant with warfarin management and PCP apts.  Sent in refill of warfarin to requested pharmacy.

## 2023-03-14 NOTE — Telephone Encounter (Signed)
Received another fax from [facility] requesting if the script for warfarin sent yesterday was for a 90 day supply.  Contacted [facility] and spoke with pharmacy tech, Sharyl Nimrod, and advised the quantity sent in the script is for a 90 days supply. She read back script to verify. Advised if any further questions to contact anticoagulation clinic. Sharyl Nimrod verbalized understanding.

## 2023-03-20 ENCOUNTER — Ambulatory Visit (INDEPENDENT_AMBULATORY_CARE_PROVIDER_SITE_OTHER): Payer: Medicare Other

## 2023-03-20 DIAGNOSIS — Z7901 Long term (current) use of anticoagulants: Secondary | ICD-10-CM | POA: Diagnosis not present

## 2023-03-20 LAB — POCT INR: INR: 3.5 — AB (ref 2.0–3.0)

## 2023-03-20 NOTE — Progress Notes (Signed)
Pt reports eating less vitamin K containing foods. Pt denies any bleeding or abnormal bruising.   Hold dose today and then change weekly dose to take 2 tablets daily except take 1 tablet on Sundays. Recheck in 3 weeks.

## 2023-03-20 NOTE — Patient Instructions (Addendum)
Pre visit review using our clinic review tool, if applicable. No additional management support is needed unless otherwise documented below in the visit note.  Hold dose today and then change weekly dose to take 2 tablets daily except take 1 tablet on Sundays. Recheck in 3 weeks.

## 2023-03-29 ENCOUNTER — Encounter: Payer: Self-pay | Admitting: Family Medicine

## 2023-03-29 ENCOUNTER — Ambulatory Visit (INDEPENDENT_AMBULATORY_CARE_PROVIDER_SITE_OTHER): Payer: Medicare Other | Admitting: Family Medicine

## 2023-03-29 VITALS — BP 128/80 | HR 57 | Temp 97.9°F | Ht 68.0 in | Wt 214.4 lb

## 2023-03-29 DIAGNOSIS — I890 Lymphedema, not elsewhere classified: Secondary | ICD-10-CM | POA: Diagnosis not present

## 2023-03-29 DIAGNOSIS — E039 Hypothyroidism, unspecified: Secondary | ICD-10-CM

## 2023-03-29 DIAGNOSIS — M545 Low back pain, unspecified: Secondary | ICD-10-CM | POA: Diagnosis not present

## 2023-03-29 DIAGNOSIS — I872 Venous insufficiency (chronic) (peripheral): Secondary | ICD-10-CM

## 2023-03-29 DIAGNOSIS — Z853 Personal history of malignant neoplasm of breast: Secondary | ICD-10-CM

## 2023-03-29 DIAGNOSIS — G8929 Other chronic pain: Secondary | ICD-10-CM

## 2023-03-29 DIAGNOSIS — H409 Unspecified glaucoma: Secondary | ICD-10-CM

## 2023-03-29 DIAGNOSIS — M25512 Pain in left shoulder: Secondary | ICD-10-CM | POA: Diagnosis not present

## 2023-03-29 MED ORDER — TRUFORM ARM SLEEVE L 15-20MMHG MISC
1.0000 | 1 refills | Status: AC
Start: 2023-03-29 — End: ?

## 2023-03-29 NOTE — Patient Instructions (Signed)
A referral was placed for home health.  They should be contacting you next week in regards to setting up appointment.

## 2023-03-29 NOTE — Progress Notes (Signed)
Established Patient Office Visit   Subjective  Patient ID: Kelli Martinez, female    DOB: January 23, 1946  Age: 77 y.o. MRN: [id number]  Chief Complaint  Patient presents with   Medical Management of Chronic Issues    Acute kidney injury follow-up   Pt accompanied by her  daughter..  Pt is a 77 yo female seen for f/u on chronic conditions.  Patient having difficulty getting a new compression sleeve for RUE lymphedema s/p breast cancer.  Patient notes ongoing back pain which makes it difficult to sit comfortably.  Patient also notes right shoulder pain causing decreased ROM.  Notes some improvement in LE edema.  Able to get shoes on.  Patient has difficulty getting socks or compression socks on due to back pain.  Has to get her daughter to help.  Patient also notes difficulty getting in and out of bathtub at daughter's home despite use of shower chair.  Shoulder pain and decreased ROM causes difficulty bathing.  Patient in the process of selling her home which had a walk-in tub.  Ambulating with a rollator.  Patient notes her vision seems worse.  Had appointment with ophthalmology a few months ago and was told things were stable.  Has another appointment coming up at the end of September.    Past Medical History:  Diagnosis Date   Allergy    Anemia    Arthritis    Asthma    Back pain, chronic    GETS INJECTIONS IN BACK   Blind right eye    hemorrhage   Blood transfusion    Cancer (HCC)    breast 1994   Clotting disorder (HCC)    Diabetes mellitus without complication (HCC)    takes precose   Elevated liver enzymes    Gallstones    GERD (gastroesophageal reflux disease)    HX: breast cancer    Hyperlipidemia    Hypertension    Hypothyroid    Lymphedema of arm    RT   Morbid obesity (HCC)    OSA (obstructive sleep apnea)    has not used in 2 years-lost weight   Osteoporosis    Peripheral neuropathy    on gabapentin   PONV (postoperative nausea and vomiting)     Psoriasis    Pulmonary embolism (HCC) 1998 / 1994 /1968   Syncope and collapse 2011   due to anemia   Past Surgical History:  Procedure Laterality Date   ABDOMINAL HYSTERECTOMY     BONE MARROW TRANSPLANT  1994   CARDIOVASCULAR STRESS TEST  05/23/2005   EF 53%   carpal tunnell     bil   cataracts     CHOLECYSTECTOMY  05/10/2012   Procedure: LAPAROSCOPIC CHOLECYSTECTOMY WITH INTRAOPERATIVE CHOLANGIOGRAM;  Surgeon: Valarie Merino, MD;  Location: [facility];  Service: General;  Laterality: N/A;  Laparoscopic Cholecystectomy with Intraoperative Cholangiogram   GASTRIC BYPASS  2011   bariatric surgery   HIATAL HERNIA REPAIR     IVC filter     recurrent DVT   KNEE ARTHROTOMY  1998   MASS EXCISION Right 11/24/2016   Procedure: EXCISION RIGHT AXILLARY LESION AND CHEST WALL LESION;  Surgeon: Luretha Murphy, MD;  Location: [facility];  Service: General;  Laterality: Right;   MASTECTOMY     Right   US ECHOCARDIOGRAPHY  10/27/08   EF 55-60%   Social History   Tobacco Use   Smoking status: Former    Current packs/day: 0.00  Average packs/day: 0.5 packs/day for 10.0 years (5.0 ttl pk-yrs)    Types: Cigarettes    Start date: 07/31/1968    Quit date: 07/31/1978    Years since quitting: 44.7   Smokeless tobacco: Never  Vaping Use   Vaping status: Never Used  Substance Use Topics   Alcohol use: No   Drug use: No   Family History  Problem Relation Age of Onset   Sudden death Mother        car accident   Cancer Father 24       lung   Cancer Sister 30       lung cancer   Asthma Daughter    Colon cancer Neg Hx    Stomach cancer Neg Hx    Esophageal cancer Neg Hx    Colon polyps Neg Hx    Allergies  Allergen Reactions   Adhesive [Tape] Rash      ROS Negative unless stated above    Objective:     BP 128/80 (BP Location: Left Arm, Patient Position: Sitting, Cuff Size: Large)   Pulse (!) 57   Temp 97.9 F (36.6 C) (Oral)   Ht 5\' 8"  (1.727 m)   Wt 214 lb 6.4  oz (97.3 kg)   SpO2 97%   BMI 32.60 kg/m  BP Readings from Last 3 Encounters:  04/10/23 122/65  04/10/23 120/65  04/04/23 (!) 132/57   Wt Readings from Last 3 Encounters:  04/10/23 220 lb (99.8 kg)  04/10/23 219 lb 6.4 oz (99.5 kg)  04/04/23 213 lb 14.4 oz (97 kg)      Physical Exam Constitutional:      General: She is not in acute distress.    Appearance: Normal appearance.  HENT:     Head: Normocephalic and atraumatic.     Nose: Nose normal.     Mouth/Throat:     Mouth: Mucous membranes are moist.  Cardiovascular:     Rate and Rhythm: Normal rate and regular rhythm.     Heart sounds: Normal heart sounds. No murmur heard.    No gallop.     Comments: Bilateral LE lymphedema, L> R..  Trace at knee. Pulmonary:     Effort: Pulmonary effort is normal. No respiratory distress.     Breath sounds: Normal breath sounds. No wheezing, rhonchi or rales.  Musculoskeletal:     Right lower leg: Edema present.     Left lower leg: Edema present.  Skin:    General: Skin is warm and dry.     Comments: RUE lymphedema.  Neurological:     Mental Status: She is alert and oriented to person, place, and time.     Comments: Ambulating with rollator.  Has difficulty moving from seated to standing position without assistance.     No results found for any visits on 03/29/23.    Assessment & Plan:  Lymphedema of right arm -     Ambulatory referral to Home Health -     TruForm Arm Sleeve L 15-31mmHg; 1 Device by Does not apply route as directed.  Dispense: 1 each; Refill: 1  Chronic left shoulder pain -     Ambulatory referral to Home Health  Glaucoma of both eyes, unspecified glaucoma type  Edema of both lower extremities due to peripheral venous insufficiency -     Ambulatory referral to Home Health  Hypothyroidism, unspecified type  BREAST CANCER, HX OF -     Ambulatory referral to Home Health  Chronic bilateral low  back pain without sciatica -     Ambulatory referral to Home  Health  Patient seen for follow-up on chronic conditions.  Stable lymphedema of right arm s/p R mastectomy for breast cancer.  Given new Rx for compression sleeve.  Patient will also contact cancer center for assistance with obtaining new compression sleeve.  Referral placed for [facility] for OT and aide to assist with ADLs.  Continue Synthroid 112 mcg daily.  Patient encouraged to keep follow-up appointment with ophthalmology.  Topical analgesics, heat, massage, and other supportive care for left shoulder pain.  Continue Lasix.  Return in about 3 months (around 06/29/2023), or if symptoms worsen or fail to improve.   Deeann Saint, MD

## 2023-03-30 DIAGNOSIS — E1151 Type 2 diabetes mellitus with diabetic peripheral angiopathy without gangrene: Secondary | ICD-10-CM | POA: Diagnosis not present

## 2023-03-30 DIAGNOSIS — Z9011 Acquired absence of right breast and nipple: Secondary | ICD-10-CM | POA: Diagnosis not present

## 2023-03-30 DIAGNOSIS — I890 Lymphedema, not elsewhere classified: Secondary | ICD-10-CM | POA: Diagnosis not present

## 2023-03-30 DIAGNOSIS — Z95828 Presence of other vascular implants and grafts: Secondary | ICD-10-CM | POA: Diagnosis not present

## 2023-03-30 DIAGNOSIS — Z79891 Long term (current) use of opiate analgesic: Secondary | ICD-10-CM | POA: Diagnosis not present

## 2023-03-30 DIAGNOSIS — J45909 Unspecified asthma, uncomplicated: Secondary | ICD-10-CM | POA: Diagnosis not present

## 2023-03-30 DIAGNOSIS — Z853 Personal history of malignant neoplasm of breast: Secondary | ICD-10-CM | POA: Diagnosis not present

## 2023-03-30 DIAGNOSIS — E039 Hypothyroidism, unspecified: Secondary | ICD-10-CM | POA: Diagnosis not present

## 2023-03-30 DIAGNOSIS — Z7901 Long term (current) use of anticoagulants: Secondary | ICD-10-CM | POA: Diagnosis not present

## 2023-03-30 DIAGNOSIS — M810 Age-related osteoporosis without current pathological fracture: Secondary | ICD-10-CM | POA: Diagnosis not present

## 2023-03-30 DIAGNOSIS — H5461 Unqualified visual loss, right eye, normal vision left eye: Secondary | ICD-10-CM | POA: Diagnosis not present

## 2023-03-30 DIAGNOSIS — M25512 Pain in left shoulder: Secondary | ICD-10-CM | POA: Diagnosis not present

## 2023-03-30 DIAGNOSIS — Z6837 Body mass index (BMI) 37.0-37.9, adult: Secondary | ICD-10-CM | POA: Diagnosis not present

## 2023-03-30 DIAGNOSIS — E1142 Type 2 diabetes mellitus with diabetic polyneuropathy: Secondary | ICD-10-CM | POA: Diagnosis not present

## 2023-03-30 DIAGNOSIS — I872 Venous insufficiency (chronic) (peripheral): Secondary | ICD-10-CM | POA: Diagnosis not present

## 2023-03-30 DIAGNOSIS — H409 Unspecified glaucoma: Secondary | ICD-10-CM | POA: Diagnosis not present

## 2023-03-30 DIAGNOSIS — D649 Anemia, unspecified: Secondary | ICD-10-CM | POA: Diagnosis not present

## 2023-03-30 DIAGNOSIS — G8929 Other chronic pain: Secondary | ICD-10-CM | POA: Diagnosis not present

## 2023-03-30 DIAGNOSIS — Z87891 Personal history of nicotine dependence: Secondary | ICD-10-CM | POA: Diagnosis not present

## 2023-03-30 DIAGNOSIS — M545 Low back pain, unspecified: Secondary | ICD-10-CM | POA: Diagnosis not present

## 2023-04-03 ENCOUNTER — Other Ambulatory Visit: Payer: Self-pay | Admitting: Adult Health

## 2023-04-03 DIAGNOSIS — D539 Nutritional anemia, unspecified: Secondary | ICD-10-CM

## 2023-04-03 DIAGNOSIS — D649 Anemia, unspecified: Secondary | ICD-10-CM | POA: Diagnosis not present

## 2023-04-03 DIAGNOSIS — E1142 Type 2 diabetes mellitus with diabetic polyneuropathy: Secondary | ICD-10-CM | POA: Diagnosis not present

## 2023-04-03 DIAGNOSIS — I872 Venous insufficiency (chronic) (peripheral): Secondary | ICD-10-CM | POA: Diagnosis not present

## 2023-04-03 DIAGNOSIS — E1151 Type 2 diabetes mellitus with diabetic peripheral angiopathy without gangrene: Secondary | ICD-10-CM | POA: Diagnosis not present

## 2023-04-03 DIAGNOSIS — I890 Lymphedema, not elsewhere classified: Secondary | ICD-10-CM | POA: Diagnosis not present

## 2023-04-03 NOTE — Progress Notes (Signed)
Patient Care Team: Deeann Saint, MD as PCP - General (Family Medicine) Maisie Fus, MD as PCP - Cardiology (Cardiology) Glendale Chard, DO as Consulting Physician (Neurology) [name], MD (Inactive) as Consulting Physician (Endocrinology)  DIAGNOSIS:  Encounter Diagnoses  Name Primary?   Iron deficiency anemia, unspecified iron deficiency anemia type Yes   Anemia due to other cause, not classified      CHIEF COMPLIANT: Observation   INTERVAL HISTORY: Kelli Martinez is a 77 y.o. female returns for follow-up and evaluation of her history of anemia. She presents to the clinic for a follow-up. Patient is tolerating the iron pills, with some mild constipation. She denies any bleeding. She does fell weak and tired but not SOB.  ALLERGIES:  is allergic to adhesive [tape].  MEDICATIONS:  Current Outpatient Medications  Medication Sig Dispense Refill   acarbose (PRECOSE) 50 MG tablet Take 50 mg by mouth 3 (three) times daily.     albuterol (ACCUNEB) 0.63 MG/3ML nebulizer solution      albuterol (PROAIR HFA) 108 (90 Base) MCG/ACT inhaler Inhale 2 puffs into the lungs every 6 (six) hours as needed for wheezing or shortness of breath. 24 g 3   baclofen (LIORESAL) 20 MG tablet TAKE 1 TABLET THREE TIMES A DAY (UPDATED PRESCRIPTION) (Patient taking differently: Take 20 mg by mouth 3 (three) times daily.) 270 tablet 3   Blood Glucose Monitoring Suppl (FREESTYLE FREEDOM LITE) w/Device KIT Use as directed to check blood sugar once daily. 1 kit 0   Calcium Carb-Cholecalciferol (CALTRATE BONE HEALTH PO) Take 1 tablet by mouth daily.     Calcium Citrate (CALCITRATE PO) Take 1 tablet by mouth daily.     clobetasol cream (TEMOVATE) 0.05 % APPLY 1 APPLICATION TOPICALLY TWICE A DAY 120 g 5   Cyanocobalamin (VITAMIN B-12 PO) Take 1 tablet by mouth every other day.     dorzolamide-timolol (COSOPT) 2-0.5 % ophthalmic solution Place 1 drop into the left eye 2 (two) times daily.      Elastic Bandages & Supports (TRUFORM ARM SLEEVE L 15-20MMHG) MISC 1 Device by Does not apply route as directed. 1 each 1   fluticasone (FLONASE) 50 MCG/ACT nasal spray USE 2 SPRAYS IN EACH NOSTRIL DAILY (Patient taking differently: Place 1 spray into both nostrils 2 (two) times daily.) 48 g 3   fluticasone-salmeterol (ADVAIR DISKUS) 100-50 MCG/ACT AEPB USE 1 INHALATION EVERY 12 HOURS, RINSE MOUTH AFTER USING (Patient taking differently: Inhale 1 puff into the lungs 2 (two) times daily.) 180 each 3   FREESTYLE LITE test strip USE AS INSTRUCTED TO CHECK BLOOD SUGAR ONCE DAILY 100 strip 3   furosemide (LASIX) 40 MG tablet Take 1 tablet (40 mg total) by mouth 2 (two) times daily. 180 tablet 3   furosemide (LASIX) 40 MG tablet Take 1 tablet (40 mg total) by mouth 2 (two) times daily. 60 tablet 0   gabapentin (NEURONTIN) 600 MG tablet TAKE 1 TABLET THREE TIMES A DAY (DOSE CHANGE) 270 tablet 3   HYDROcodone-acetaminophen (NORCO) 5-325 MG tablet Take 1 tablet by mouth 2 (two) times daily as needed for moderate pain. (Patient taking differently: Take 0.5 tablets by mouth 2 (two) times daily as needed for severe pain.) 50 tablet 0   Lancets (FREESTYLE) lancets USE AS INSTRUCTED TO CHECK BLOOD SUGAR ONCE DAILY 100 each 1   levothyroxine (SYNTHROID) 112 MCG tablet Take 1 tablet (112 mcg total) by mouth daily. 90 tablet 3   Magnesium 250 MG TABS Take  250 mg by mouth 2 (two) times daily.     omeprazole (PRILOSEC) 40 MG capsule Take 1 capsule (40 mg total) by mouth daily. 90 capsule 3   Polyvinyl Alcohol (LIQUID TEARS OP) Place 1 drop into the right eye 2 (two) times daily.     potassium chloride SA (KLOR-CON M) 20 MEQ tablet Take 2 tablets (40 mEq total) by mouth 2 (two) times daily. (Patient taking differently: Take 20 mEq by mouth 4 (four) times daily.) 360 tablet 3   pravastatin (PRAVACHOL) 10 MG tablet TAKE 1 TABLET DAILY (Patient taking differently: Take 10 mg by mouth daily.) 90 tablet 1   spironolactone  (ALDACTONE) 100 MG tablet Take 100 mg by mouth daily.     temazepam (RESTORIL) 15 MG capsule Take 1 capsule (15 mg total) by mouth at bedtime as needed for sleep. 90 capsule 1   ursodiol (ACTIGALL) 300 MG capsule Take 2 capsules (600 mg total) by mouth 2 (two) times daily. 360 capsule 2   Vitamin D, Ergocalciferol, 50000 units CAPS Take 1 capsule by mouth once a week. (Patient taking differently: Take 50,000 Units by mouth every Friday.) 12 capsule 3   warfarin (COUMADIN) 5 MG tablet TAKE 2 TABLETS BY MOUTH DAILY OR AS DIRECTED BY ANTICOAGULATION CLINIC 205 tablet 3   No current facility-administered medications for this visit.    PHYSICAL EXAMINATION: ECOG PERFORMANCE STATUS: 1 - Symptomatic but completely ambulatory  Vitals:   04/04/23 0856  BP: (!) 132/57  Pulse: (!) 56  Resp: 18  Temp: (!) 97.5 F (36.4 C)  SpO2: 100%   Filed Weights   04/04/23 0856  Weight: 213 lb 14.4 oz (97 kg)      LABORATORY DATA:  I have reviewed the data as listed    Latest Ref Rng & Units 04/04/2023    8:23 AM 01/11/2023    8:47 AM 01/05/2023    8:04 AM  CMP  Glucose 70 - 99 mg/dL 914  782  956   BUN 8 - 23 mg/dL 21  24  15    Creatinine 0.44 - 1.00 mg/dL 2.13  0.86  5.78   Sodium 135 - 145 mmol/L 139  137  136   Potassium 3.5 - 5.1 mmol/L 4.4  4.1  3.7   Chloride 98 - 111 mmol/L 100  98  100   CO2 22 - 32 mmol/L 32  29  24   Calcium 8.9 - 10.3 mg/dL 9.9  9.3  9.0   Total Protein 6.5 - 8.1 g/dL 7.5     Total Bilirubin 0.3 - 1.2 mg/dL 0.4     Alkaline Phos 38 - 126 U/L 101     AST 15 - 41 U/L 14     ALT 0 - 44 U/L 11       Lab Results  Component Value Date   WBC 3.9 (L) 04/04/2023   HGB 12.4 04/04/2023   HCT 39.0 04/04/2023   MCV 89.0 04/04/2023   PLT 236 04/04/2023   NEUTROABS 2.6 04/04/2023    ASSESSMENT & PLAN:  Iron deficiency anemia Gastric bypass surgery in 2011 had history of iron deficiency from that time where she tells me that she received IV iron. Patient was referred to  Dr. Clelia Croft in September 2023 to evaluate for iron deficiency.  In July 2023 her hemoglobin was 11.7, MCV 87.9, iron 57, saturation 11.7%, ferritin 674.3.  The IBC 485.8. Has been on oral iron She underwent an upper endoscopy on March 03, 2022 that demonstrated reflux and gastric irritation and she was started on omeprazole since then.  Lab review: Hb 12.4  Treatment plan:Monitor with labs and F/U in 6 months    No orders of the defined types were placed in this encounter.  The patient has a good understanding of the overall plan. she agrees with it. she will call with any problems that may develop before the next visit here. Total time spent: 30 mins including face to face time and time spent for planning, charting and co-ordination of care   Tamsen Meek, MD 04/04/23    I Janan Ridge am acting as a Neurosurgeon for [employer]  I have reviewed the above documentation for accuracy and completeness, and I agree with the above.

## 2023-04-04 ENCOUNTER — Inpatient Hospital Stay: Payer: Medicare Other | Admitting: Hematology and Oncology

## 2023-04-04 ENCOUNTER — Inpatient Hospital Stay: Payer: Medicare Other | Attending: Hematology and Oncology

## 2023-04-04 ENCOUNTER — Telehealth: Payer: Self-pay | Admitting: Family Medicine

## 2023-04-04 ENCOUNTER — Other Ambulatory Visit: Payer: Self-pay | Admitting: *Deleted

## 2023-04-04 VITALS — BP 132/57 | HR 56 | Temp 97.5°F | Resp 18 | Ht 68.0 in | Wt 213.9 lb

## 2023-04-04 DIAGNOSIS — I890 Lymphedema, not elsewhere classified: Secondary | ICD-10-CM | POA: Diagnosis not present

## 2023-04-04 DIAGNOSIS — D509 Iron deficiency anemia, unspecified: Secondary | ICD-10-CM

## 2023-04-04 DIAGNOSIS — D6489 Other specified anemias: Secondary | ICD-10-CM

## 2023-04-04 DIAGNOSIS — E1142 Type 2 diabetes mellitus with diabetic polyneuropathy: Secondary | ICD-10-CM | POA: Diagnosis not present

## 2023-04-04 DIAGNOSIS — D649 Anemia, unspecified: Secondary | ICD-10-CM | POA: Diagnosis not present

## 2023-04-04 DIAGNOSIS — E1151 Type 2 diabetes mellitus with diabetic peripheral angiopathy without gangrene: Secondary | ICD-10-CM | POA: Diagnosis not present

## 2023-04-04 DIAGNOSIS — Z9884 Bariatric surgery status: Secondary | ICD-10-CM | POA: Insufficient documentation

## 2023-04-04 DIAGNOSIS — I872 Venous insufficiency (chronic) (peripheral): Secondary | ICD-10-CM | POA: Diagnosis not present

## 2023-04-04 DIAGNOSIS — D539 Nutritional anemia, unspecified: Secondary | ICD-10-CM

## 2023-04-04 LAB — CMP (CANCER CENTER ONLY)
ALT: 11 U/L (ref 0–44)
AST: 14 U/L — ABNORMAL LOW (ref 15–41)
Albumin: 4.1 g/dL (ref 3.5–5.0)
Alkaline Phosphatase: 101 U/L (ref 38–126)
Anion gap: 7 (ref 5–15)
BUN: 21 mg/dL (ref 8–23)
CO2: 32 mmol/L (ref 22–32)
Calcium: 9.9 mg/dL (ref 8.9–10.3)
Chloride: 100 mmol/L (ref 98–111)
Creatinine: 1.13 mg/dL — ABNORMAL HIGH (ref 0.44–1.00)
GFR, Estimated: 51 mL/min — ABNORMAL LOW (ref >60)
Glucose, Bld: 132 mg/dL — ABNORMAL HIGH (ref 70–99)
Potassium: 4.4 mmol/L (ref 3.5–5.1)
Sodium: 139 mmol/L (ref 135–145)
Total Bilirubin: 0.4 mg/dL (ref 0.3–1.2)
Total Protein: 7.5 g/dL (ref 6.5–8.1)

## 2023-04-04 LAB — CBC WITH DIFFERENTIAL (CANCER CENTER ONLY)
Abs Immature Granulocytes: 0 10*3/uL (ref 0.00–0.07)
Basophils Absolute: 0.1 10*3/uL (ref 0.0–0.1)
Basophils Relative: 2 %
Eosinophils Absolute: 0.1 10*3/uL (ref 0.0–0.5)
Eosinophils Relative: 1 %
HCT: 39 % (ref 36.0–46.0)
Hemoglobin: 12.4 g/dL (ref 12.0–15.0)
Immature Granulocytes: 0 %
Lymphocytes Relative: 25 %
Lymphs Abs: 1 10*3/uL (ref 0.7–4.0)
MCH: 28.3 pg (ref 26.0–34.0)
MCHC: 31.8 g/dL (ref 30.0–36.0)
MCV: 89 fL (ref 80.0–100.0)
Monocytes Absolute: 0.2 10*3/uL (ref 0.1–1.0)
Monocytes Relative: 6 %
Neutro Abs: 2.6 10*3/uL (ref 1.7–7.7)
Neutrophils Relative %: 66 %
Platelet Count: 236 10*3/uL (ref 150–400)
RBC: 4.38 MIL/uL (ref 3.87–5.11)
RDW: 14 % (ref 11.5–15.5)
WBC Count: 3.9 10*3/uL — ABNORMAL LOW (ref 4.0–10.5)
nRBC: 0 % (ref 0.0–0.2)

## 2023-04-04 LAB — IRON AND IRON BINDING CAPACITY (CC-WL,HP ONLY)
Iron: 94 ug/dL (ref 28–170)
Saturation Ratios: 21 % (ref 10.4–31.8)
TIBC: 440 ug/dL (ref 250–450)
UIBC: 346 ug/dL (ref 148–442)

## 2023-04-04 LAB — FERRITIN: Ferritin: 483 ng/mL — ABNORMAL HIGH (ref 11–307)

## 2023-04-04 NOTE — Telephone Encounter (Signed)
Steward Drone - PT with [facility] [phone] *Ok to leave a detailed message on this line  Verbal Orders - Physical Therapy  2x a wk 2wks 1x a wk 6wks

## 2023-04-04 NOTE — Telephone Encounter (Signed)
Kelli Martinez from [facility] call and stated she need a order for PT once a wk for eight wk's.Kelli Martinez 's # is [phone].

## 2023-04-04 NOTE — Assessment & Plan Note (Signed)
Gastric bypass surgery in 2011 had history of iron deficiency from that time where she tells me that she received IV iron. Patient was referred to Dr. Clelia Croft in September 2023 to evaluate for iron deficiency.  In July 2023 her hemoglobin was 11.7, MCV 87.9, iron 57, saturation 11.7%, ferritin 674.3.  The IBC 485.8.  Was briefly on oral iron She underwent an upper endoscopy on March 03, 2022 that demonstrated reflux and gastric irritation and she was started on omeprazole since then.  Lab review:  Treatment plan:

## 2023-04-05 ENCOUNTER — Other Ambulatory Visit (INDEPENDENT_AMBULATORY_CARE_PROVIDER_SITE_OTHER): Payer: Medicare Other

## 2023-04-05 DIAGNOSIS — E063 Autoimmune thyroiditis: Secondary | ICD-10-CM

## 2023-04-05 DIAGNOSIS — L84 Corns and callosities: Secondary | ICD-10-CM | POA: Diagnosis not present

## 2023-04-05 DIAGNOSIS — E1151 Type 2 diabetes mellitus with diabetic peripheral angiopathy without gangrene: Secondary | ICD-10-CM | POA: Diagnosis not present

## 2023-04-05 DIAGNOSIS — E1142 Type 2 diabetes mellitus with diabetic polyneuropathy: Secondary | ICD-10-CM

## 2023-04-05 DIAGNOSIS — I739 Peripheral vascular disease, unspecified: Secondary | ICD-10-CM | POA: Diagnosis not present

## 2023-04-05 DIAGNOSIS — L603 Nail dystrophy: Secondary | ICD-10-CM | POA: Diagnosis not present

## 2023-04-05 LAB — BASIC METABOLIC PANEL
BUN: 25 mg/dL — ABNORMAL HIGH (ref 6–23)
CO2: 31 meq/L (ref 19–32)
Calcium: 9.8 mg/dL (ref 8.4–10.5)
Chloride: 97 meq/L (ref 96–112)
Creatinine, Ser: 1.18 mg/dL (ref 0.40–1.20)
GFR: 45.24 mL/min — ABNORMAL LOW (ref >60.00)
Glucose, Bld: 114 mg/dL — ABNORMAL HIGH (ref 70–99)
Potassium: 4.1 meq/L (ref 3.5–5.1)
Sodium: 138 meq/L (ref 135–145)

## 2023-04-05 LAB — TSH: TSH: 1.94 u[IU]/mL (ref 0.35–5.50)

## 2023-04-05 LAB — HEMOGLOBIN A1C: Hgb A1c MFr Bld: 7.1 % — ABNORMAL HIGH (ref 4.6–6.5)

## 2023-04-05 NOTE — Telephone Encounter (Signed)
Ok

## 2023-04-05 NOTE — Telephone Encounter (Signed)
Called and spoke with Lelon Mast gave a Verbal order

## 2023-04-06 DIAGNOSIS — I890 Lymphedema, not elsewhere classified: Secondary | ICD-10-CM | POA: Diagnosis not present

## 2023-04-06 DIAGNOSIS — E1142 Type 2 diabetes mellitus with diabetic polyneuropathy: Secondary | ICD-10-CM | POA: Diagnosis not present

## 2023-04-06 DIAGNOSIS — I872 Venous insufficiency (chronic) (peripheral): Secondary | ICD-10-CM | POA: Diagnosis not present

## 2023-04-06 DIAGNOSIS — D649 Anemia, unspecified: Secondary | ICD-10-CM | POA: Diagnosis not present

## 2023-04-06 DIAGNOSIS — E1151 Type 2 diabetes mellitus with diabetic peripheral angiopathy without gangrene: Secondary | ICD-10-CM | POA: Diagnosis not present

## 2023-04-06 NOTE — Telephone Encounter (Signed)
Ok

## 2023-04-09 NOTE — Telephone Encounter (Signed)
Left Verbal order on Voice Mail

## 2023-04-10 ENCOUNTER — Encounter: Payer: Self-pay | Admitting: Family Medicine

## 2023-04-10 ENCOUNTER — Encounter: Payer: Self-pay | Admitting: Endocrinology

## 2023-04-10 ENCOUNTER — Ambulatory Visit (INDEPENDENT_AMBULATORY_CARE_PROVIDER_SITE_OTHER): Payer: Medicare Other | Admitting: Endocrinology

## 2023-04-10 ENCOUNTER — Encounter: Payer: Self-pay | Admitting: Internal Medicine

## 2023-04-10 ENCOUNTER — Ambulatory Visit (INDEPENDENT_AMBULATORY_CARE_PROVIDER_SITE_OTHER): Payer: Medicare Other | Admitting: Internal Medicine

## 2023-04-10 ENCOUNTER — Ambulatory Visit (INDEPENDENT_AMBULATORY_CARE_PROVIDER_SITE_OTHER): Payer: Medicare Other

## 2023-04-10 VITALS — BP 122/65 | HR 60 | Ht 68.0 in | Wt 220.0 lb

## 2023-04-10 VITALS — BP 120/65 | HR 50 | Ht 68.0 in | Wt 219.4 lb

## 2023-04-10 DIAGNOSIS — Z23 Encounter for immunization: Secondary | ICD-10-CM | POA: Diagnosis not present

## 2023-04-10 DIAGNOSIS — E063 Autoimmune thyroiditis: Secondary | ICD-10-CM

## 2023-04-10 DIAGNOSIS — E7800 Pure hypercholesterolemia, unspecified: Secondary | ICD-10-CM

## 2023-04-10 DIAGNOSIS — E161 Other hypoglycemia: Secondary | ICD-10-CM

## 2023-04-10 DIAGNOSIS — K912 Postsurgical malabsorption, not elsewhere classified: Secondary | ICD-10-CM

## 2023-04-10 DIAGNOSIS — N1832 Chronic kidney disease, stage 3b: Secondary | ICD-10-CM | POA: Diagnosis not present

## 2023-04-10 DIAGNOSIS — Z9889 Other specified postprocedural states: Secondary | ICD-10-CM

## 2023-04-10 DIAGNOSIS — Z9884 Bariatric surgery status: Secondary | ICD-10-CM

## 2023-04-10 DIAGNOSIS — Z7901 Long term (current) use of anticoagulants: Secondary | ICD-10-CM | POA: Diagnosis not present

## 2023-04-10 DIAGNOSIS — Z7984 Long term (current) use of oral hypoglycemic drugs: Secondary | ICD-10-CM

## 2023-04-10 DIAGNOSIS — K743 Primary biliary cirrhosis: Secondary | ICD-10-CM | POA: Diagnosis not present

## 2023-04-10 DIAGNOSIS — K317 Polyp of stomach and duodenum: Secondary | ICD-10-CM | POA: Diagnosis not present

## 2023-04-10 DIAGNOSIS — E1142 Type 2 diabetes mellitus with diabetic polyneuropathy: Secondary | ICD-10-CM

## 2023-04-10 LAB — POCT INR: INR: 2.6 (ref 2.0–3.0)

## 2023-04-10 MED ORDER — URSODIOL 300 MG PO CAPS: 600.0000 mg | ORAL_CAPSULE | Freq: Two times a day (BID) | ORAL | 2 refills | Status: DC

## 2023-04-10 MED ORDER — OMEPRAZOLE 40 MG PO CPDR: 40.0000 mg | DELAYED_RELEASE_CAPSULE | Freq: Every day | ORAL | Status: DC

## 2023-04-10 NOTE — Progress Notes (Signed)
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 She has been experiencing some gas. She will have some burping and epigastric ab discomfort at times. She takes a stool softener or a SmoothMove tea to have a BM every day.  If she does not take anything, she will have more significant constipation issues.  Reflux is doing okay on omeprazole every day. She did okay with dropping down her PPI from BID to every day. Blood sugars have been good.  She has been referred to nephrology for evaluation of her kidneys.  Wt Readings from Last 3 Encounters:  04/10/23 220 lb (99.8 kg)  04/10/23 219 lb 6.4 oz (99.5 kg)  04/04/23 213 lb 14.4 oz (97 kg)   Current Outpatient Medications  Medication Sig Dispense Refill   acarbose (PRECOSE) 50 MG tablet Take 50 mg by mouth 3 (three) times daily.     albuterol (ACCUNEB) 0.63 MG/3ML nebulizer solution      albuterol (PROAIR HFA) 108 (90 Base) MCG/ACT inhaler Inhale 2 puffs into the lungs every 6 (six) hours as needed for wheezing or shortness of breath. 24 g 3   baclofen (LIORESAL) 20 MG tablet TAKE 1 TABLET THREE TIMES A DAY (UPDATED PRESCRIPTION) (Patient taking differently: Take 20 mg by mouth 3 (three) times daily.) 270 tablet 3   Blood Glucose Monitoring Suppl (FREESTYLE FREEDOM LITE) w/Device KIT Use as directed to check blood sugar once daily. 1 kit 0   Calcium Carb-Cholecalciferol (CALTRATE BONE HEALTH PO) Take 1 tablet by mouth daily.     Calcium Citrate (CALCITRATE PO) Take 1 tablet by mouth daily.     clobetasol cream (TEMOVATE) 0.05 % APPLY 1 APPLICATION TOPICALLY TWICE A DAY 120 g 5   Cyanocobalamin (VITAMIN B-12 PO) Take 1 tablet by mouth every  other day.     dorzolamide-timolol (COSOPT) 2-0.5 % ophthalmic solution Place 1 drop into the left eye 2 (two) times daily.     Elastic Bandages & Supports (TRUFORM ARM SLEEVE L 15-20MMHG) MISC 1 Device by Does not apply route as directed. 1 each 1   fluticasone (FLONASE) 50 MCG/ACT nasal spray USE 2 SPRAYS IN EACH NOSTRIL DAILY (Patient taking differently: Place 1 spray into both nostrils 2 (two) times daily.) 48 g 3   fluticasone-salmeterol (ADVAIR DISKUS) 100-50 MCG/ACT AEPB USE 1 INHALATION EVERY 12 HOURS, RINSE MOUTH AFTER USING (Patient taking differently: Inhale 1 puff into the lungs 2 (two) times daily.) 180 each 3   FREESTYLE LITE test strip USE AS INSTRUCTED TO CHECK BLOOD SUGAR ONCE DAILY 100 strip 3   furosemide (LASIX) 40 MG tablet Take 1 tablet (40 mg total) by mouth 2 (two) times daily. 60 tablet 0   gabapentin (NEURONTIN) 600 MG tablet TAKE 1 TABLET THREE TIMES A DAY (DOSE CHANGE) 270 tablet 3   HYDROcodone-acetaminophen (NORCO) 5-325 MG tablet Take 1 tablet by mouth 2 (two) times daily as needed for moderate pain. (Patient taking differently: Take 0.5 tablets by mouth 2 (two) times daily as needed for severe pain.) 50 tablet 0   Lancets (FREESTYLE) lancets USE AS INSTRUCTED TO CHECK BLOOD SUGAR ONCE DAILY 100 each 1   levothyroxine (SYNTHROID) 112 MCG tablet Take 1 tablet (112  mcg total) by mouth daily. 90 tablet 3   Magnesium 250 MG TABS Take 250 mg by mouth 2 (two) times daily.     omeprazole (PRILOSEC) 40 MG capsule Take 1 capsule (40 mg total) by mouth daily. 90 capsule 3   Polyvinyl Alcohol (LIQUID TEARS OP) Place 1 drop into the right eye 2 (two) times daily.     potassium chloride SA (KLOR-CON M) 20 MEQ tablet Take 2 tablets (40 mEq total) by mouth 2 (two) times daily. (Patient taking differently: Take 20 mEq by mouth 4 (four) times daily.) 360 tablet 3   pravastatin (PRAVACHOL) 10 MG tablet TAKE 1 TABLET DAILY (Patient taking differently: Take 10 mg by mouth daily.) 90 tablet  1   spironolactone (ALDACTONE) 100 MG tablet Take 100 mg by mouth daily.     temazepam (RESTORIL) 15 MG capsule Take 1 capsule (15 mg total) by mouth at bedtime as needed for sleep. 90 capsule 1   ursodiol (ACTIGALL) 300 MG capsule Take 2 capsules (600 mg total) by mouth 2 (two) times daily. 360 capsule 2   Vitamin D, Ergocalciferol, 50000 units CAPS Take 1 capsule by mouth once a week. (Patient taking differently: Take 50,000 Units by mouth every Friday.) 12 capsule 3   warfarin (COUMADIN) 5 MG tablet TAKE 2 TABLETS BY MOUTH DAILY OR AS DIRECTED BY ANTICOAGULATION CLINIC 205 tablet 3   furosemide (LASIX) 40 MG tablet Take 1 tablet (40 mg total) by mouth 2 (two) times daily. (Patient not taking: Reported on 04/10/2023) 180 tablet 3   No current facility-administered medications for this visit.   Physical Exam: BP 122/65   Pulse 60   Ht 5\' 8"  (1.727 m)   Wt 220 lb (99.8 kg)   BMI 33.45 kg/m  Constitutional: Pleasant,well-developed, female in no acute distress. HEENT: Normocephalic and atraumatic. Conjunctivae are normal. No scleral icterus. Cardiovascular: Normal rate, regular rhythm.  Pulmonary/chest: Effort normal and breath sounds normal. No wheezing, rales or rhonchi. Abdominal: Soft, nondistended, nontender. Bowel sounds active throughout. There are no masses palpable. No hepatomegaly. Extremities: 2+ BLE edema Neurological: Alert and oriented to person place and time. Skin: Skin is warm and dry. No rashes noted. Psychiatric: Normal mood and affect. Behavior is normal.  Labs 06/2021: CBC nml  Labs 10/2021: Negative Cologuard  Labs 11/2021: HbA1C 6.9%.   Labs 12/2021: CMP with elevated Cr of 1.26 and BUN of 36. AST and ALT are elevated to 69 and 52, respectively. Alk phos and T bili are normal. TSH nml.   Labs 01/2022: CBC with mildly low Hb of 11.7. ANA positive at 1:320. CBC low at 11.7. INR elevated at 2.2. IgG nml. ASMA negative. AMA positive for 101.3. Hep B surface antibody NR.  Hep B surface antigen NR. Hep C antibody NR. Hep A antibody positive. IBC ferritin with mildly low iron sat of 11.7%  Labs 04/2022: CBC with low Hb of 11.2. CMP with elevated Cr of 1.02. INR nml.   Labs 04/2023 CBC with mildly low Hb of 12.4.   CT A/P w/contrast 11/05/14: IMPRESSION: Status post right mastectomy with right axillary lymph node dissection. Radiation changes in the right upper lobe. No evidence of recurrent or metastatic disease. Postsurgical changes related to gastric bypass, cholecystectomy, and hysterectomy. IVC filter. Additional ancillary findings as above.  TTE 08/25/21: IMPRESSIONS   1. Left ventricular ejection fraction, by estimation, is 55 to 60%. The left ventricle has normal function. The left ventricle has no regional wall motion abnormalities. Left ventricular  diastolic parameters were normal. The average left ventricular global longitudinal strain is -22.9 %. The global longitudinal strain is normal.   2. Right ventricular systolic function is normal. The right ventricular size is normal. There is normal pulmonary artery systolic pressure. The estimated right ventricular systolic pressure is 29.2 mmHg.   3. The mitral valve is abnormal. Mild mitral valve regurgitation. Moderate mitral annular calcification.   4. The aortic valve is tricuspid. There is moderate calcification of the aortic valve. There is moderate thickening of the aortic valve. Aortic valve regurgitation is not visualized. Aortic valve sclerosis/calcification is present, without any evidence of aortic stenosis.   5. The inferior vena cava is normal in size with <50% respiratory variability, suggesting right atrial pressure of 8 mmHg.   RUQ U/S 02/23/22: IMPRESSION: 1. Increased echogenicity is identified in the liver. This is a nonspecific finding but often seen with hepatic steatosis. Nonspecific causes of hepatocellular disease may result in the same findings. 2. No other abnormalities.  Liver  biopsy 05/29/22: A. LIVER, BIOPSY: - Patchy portal-based inflammation and minimal lobular inflammation; see comment - Mild fibrosis (stage 1 of 4) COMMENT: Biopsy shows liver parenchyma with preserved architecture. A mononuclear cell infiltrate, moderate focally marked in degree, expands some of the portal tracts in a patchy manner.  This infiltrate is composed principally of small lymphocytes, plasma cells and eosinophils. There is associated bile ductular injury with lymphocytic cholangitis and focal 'florid duct lesion'.  Hepatic lobules show rare foci of necroinflammation and isolated apoptotic hepatocytes.  Trichrome stain shows patchy portal fibrous expansion.  Reticulin stain highlights thenormal trabecular architecture.  PASD stain shows no globular inclusions within hepatocytes.  Iron stain shows minimal granular iron deposition within hepatocytes and moderate staining within histiocytes and Kupffer cells. The findings are compatible with primary biliary cholangitis (PBC), especially with patient's clinical finding of positive M2 anti-mitochondrial antibody test.  Histologic features do not support overlapping autoimmune hepatitis.  Clinical and serological correlation is suggested.   EGD 03/03/22: - Normal esophagus. - Small hiatal hernia. - Gastritis. Biopsied. - Two gastric polyps. Resected and retrieved. - Roux-en-Y gastrojejunostomy with gastrojejunal anastomosis characterized by healthy appearing mucosa. - Normal examined jejunum. Biopsied. Path: 1. Surgical [P], 2nd portion of duodenum - DUODENAL MUCOSA WITH NO SPECIFIC HISTOPATHOLOGIC CHANGES - NEGATIVE FOR INCREASED INTRAEPITHELIAL LYMPHOCYTES OR VILLOUS ARCHITECTURAL CHANGES 2. Surgical [P], gastric antrum and gastric body - GASTRIC ANTRAL AND OXYNTIC MUCOSA SHOWING REACTIVE GASTROPATHY WITH SUBEPITHELIAL CRYSTALLINE DEPOSITS, CONSISTENT WITH IRON PILL GASTROPATHY - HELICOBACTER PYLORI-LIKE ORGANISMS ARE NOT IDENTIFIED ON  ROUTINE H&E STAIN 3. Surgical [P], gastric body, polyp (2) - GASTRIC HYPERPLASTIC POLYP(S) - NEGATIVE FOR INTESTINAL METAPLASIA OR DYSPLASIA  ASSESSMENT AND PLAN: Primary biliary cholangitis Postprandial hypoglycemia S/p RYGB History of iron pill gastritis Gastric hyperplastic polyps GERD Elevated LFTs - improved Patient presents for follow-up of PBC.  She has been doing well on ursodiol.  Her LFTs are normal.  Platelets have recently been normal as well.  Patient tolerated decreasing from twice daily PPI to daily PPI well.  She does describe some issues with burping and abdominal discomfort so we will start her on a daily dose of MiraLAX to see if controlling her constipation better may help with the symptoms.  Blood sugars have been doing well recently.  He is due for an EGD for follow-up of her gastric hyperplastic polyps that were seen a year ago so we will go ahead and get this scheduled. - Continue ursodiol 600 mg BID. Refilled -  Continue PPI every day. Refilled - Start daily dose of Miralax  - Previously counseled on avoiding simple sugars and limiting carbohydrate intake to less than 30 grams per meal. Can try to eat smaller meals and aim to eat more protein. - EGD [facility] due to history of gastric hyperplastic polyps. Will need warfarin held for 5 days before her procedure.  - RTC 6 months  Eulah Pont, MD  I spent 35 minutes of time, including in depth chart review, independent review of results as outlined above, communicating results with the patient directly, face-to-face time with the patient, coordinating care, ordering studies and medications as appropriate, and documentation.

## 2023-04-10 NOTE — Progress Notes (Cosign Needed Addendum)
Continue 2 tablets daily except take 1 tablet on Sundays. Recheck in 6 weeks.   Pt requested high dose flu vaccine at visit. Administered high dose flu vaccine in L deltoid. Pt tolerated well.   Medical screening examination/treatment/procedure(s) were performed by non-physician practitioner and as supervising physician I was immediately available for consultation/collaboration.  I agree with above. Jacinta Shoe, MD

## 2023-04-10 NOTE — Patient Instructions (Addendum)
Pre visit review using our clinic review tool, if applicable. No additional management support is needed unless otherwise documented below in the visit note.  Continue 2 tablets daily except take 1 tablet on Sundays. Recheck in 6 weeks.

## 2023-04-10 NOTE — Patient Instructions (Addendum)
We have sent the following medications to your pharmacy for you to pick up at your convenience: Omeprazole, Ursodiol  Start taking Miralax daily  You have been scheduled for an endoscopy. Please follow written instructions given to you at your visit today.  If you use inhalers (even only as needed), please bring them with you on the day of your procedure.  If you take any of the following medications, they will need to be adjusted prior to your procedure:   DO NOT TAKE 7 DAYS PRIOR TO TEST- Trulicity (dulaglutide) Ozempic, Wegovy (semaglutide) Mounjaro (tirzepatide) Bydureon Bcise (exanatide extended release)  DO NOT TAKE 1 DAY PRIOR TO YOUR TEST Rybelsus (semaglutide) Adlyxin (lixisenatide) Victoza (liraglutide) Byetta (exanatide)  You will be contaced by our office prior to your procedure for directions on holding your Coumadin/Warfarin.  If you do not hear from our office 1 week prior to your scheduled procedure, please call [phone] to discuss.      If your blood pressure at your visit was 140/90 or greater, please contact your primary care physician to follow up on this.  _______________________________________________________  If you are age 13 or older, your body mass index should be between 23-30. Your Body mass index is 33.45 kg/m. If this is out of the aforementioned range listed, please consider follow up with your Primary Care Provider.  If you are age 56 or younger, your body mass index should be between 19-25. Your Body mass index is 33.45 kg/m. If this is out of the aformentioned range listed, please consider follow up with your Primary Care Provider.   ________________________________________________________  The [facility] providers would like to encourage you to use [facility] to communicate with providers for non-urgent requests or questions.  Due to long hold times on the telephone, sending your provider a message by [facility] may be a faster and more  efficient way to get a response.  Please allow 48 business hours for a response.  Please remember that this is for non-urgent requests.  _______________________________________________________  Due to recent changes in healthcare laws, you may see the results of your imaging and laboratory studies on MyChart before your provider has had a chance to review them.  We understand that in some cases there may be results that are confusing or concerning to you. Not all laboratory results come back in the same time frame and the provider may be waiting for multiple results in order to interpret others.  Please give [place] 48 hours in order for your provider to thoroughly review all the results before contacting the office for clarification of your results.    Thank you for entrusting me with your care and for choosing [facility], Dr. Eulah Pont

## 2023-04-10 NOTE — Patient Instructions (Signed)
Continue acarbose 50 mg with meals three times a day.

## 2023-04-10 NOTE — Progress Notes (Signed)
Outpatient Endocrinology Note [place] Isaac Lacson, MD  04/10/23  Patient's Name: Kelli Martinez    DOB: [birth year]    MRN: [id number]                                                    REASON OF VISIT: Follow up for type 2 diabetes mellitus / hypoglycemia  PCP: Deeann Saint, MD  HISTORY OF PRESENT ILLNESS:   Kelli Martinez is a 77 y.o. old female with past medical history listed below, is here for follow up of type 2 diabetes mellitus and hypoglycemia.   Pertinent Diabetes History: Patient was diagnosed with type 2 diabetes mellitus several years ago, has mild and well-controlled since her gastric bypass surgery in 2011.  She has postprandial hypoglycemia related with dumping syndrome.  Chronic Diabetes Complications : Retinopathy: no. Last ophthalmology exam was done on annually reportedly. Nephropathy: CKD IIIb Peripheral neuropathy: yes, on burning, numbness, on gabapentin and nortriptyline.  Coronary artery disease: no Stroke: no  Relevant comorbidities and cardiovascular risk factors: Obesity: yes Body mass index is 33.36 kg/m.  Hypertension: yes Hyperlipidemia. Yes, on statin.   Current / Home Diabetic regimen includes: Acarbose 50 mg 3 times a day with meals.  Prior diabetic medications: Metformin stopped due to tendency of hypoglycemia.  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 Raw data and trends analyzed.  Libre view glucometer. -  She has been testing her blood glucoses 1 times daily.  -  Average glucose for the last 60 days is 118 mg/dl, range 64 - 355. -  Trends noted: Most of the blood sugar are acceptable in the 90 to low 100 range.  Occasionally blood sugar up to 200.  1 episode of hypoglycemia with blood sugar 64 in last 2 months.  Hypoglycemia: Patient has minor hypoglycemic episodes. Patient has hypoglycemia awareness.  Factors modifying glucose control: 1.  Diabetic diet assessment: 3 meals a day.  2.   Staying active or exercising: not able to do exercise.  3.  Medication compliance: compliant all of the time.  # Postprandial hyperglycemia related to dumping syndrome. She had gastric bypass surgery in 2011. -She uses glucose tablet or juice to correct hypoglycemia and hypoglycemic symptoms.  Usually 1 to 2 tablets are educated. -She had seen dietitian instructed for mixed meal diet with low-fat meals enough protein consistently and restricting carbohydrates including fluids. -She did try verapamil previously without benefit. -She is on acarbose for hypoglycemia as well.  # Primary hypothyroidism for several years. -On Thyroid hormone replacement/levothyroxine 112 mcg daily.  Interval history 04/10/23  Meter data as reviewed above.  She has complaints of numbness and tingling of the feet.  No vision problem.  No other complaints today.  Denies hypoglycemic symptoms.  She is currently taking levothyroxine 112 mcg daily.  Recent labs reviewed CKD with EGFR 45.  Hemoglobin A1c 7.1%.  TSH normal at 1.94.  REVIEW OF SYSTEMS As per history of present illness.   PAST MEDICAL HISTORY: Past Medical History:  Diagnosis Date   Allergy    Anemia    Arthritis    Asthma    Back pain, chronic    GETS INJECTIONS IN BACK   Blind right eye    hemorrhage   Blood transfusion    Cancer [facility])    breast 1994  Clotting disorder (HCC)    Diabetes mellitus without complication (HCC)    takes precose   Elevated liver enzymes    Gallstones    GERD (gastroesophageal reflux disease)    HX: breast cancer    Hyperlipidemia    Hypertension    Hypothyroid    Lymphedema of arm    RT   Morbid obesity (HCC)    OSA (obstructive sleep apnea)    has not used in 2 years-lost weight   Osteoporosis    Peripheral neuropathy    on gabapentin   PONV (postoperative nausea and vomiting)    Psoriasis    Pulmonary embolism (HCC) 1998 / 1994 /1968   Syncope and collapse 2011   due to anemia    PAST  SURGICAL HISTORY: Past Surgical History:  Procedure Laterality Date   ABDOMINAL HYSTERECTOMY     BONE MARROW TRANSPLANT  1994   CARDIOVASCULAR STRESS TEST  05/23/2005   EF 53%   carpal tunnell     bil   cataracts     CHOLECYSTECTOMY  05/10/2012   Procedure: LAPAROSCOPIC CHOLECYSTECTOMY WITH INTRAOPERATIVE CHOLANGIOGRAM;  Surgeon: Valarie Merino, MD;  Location: [facility];  Service: General;  Laterality: N/A;  Laparoscopic Cholecystectomy with Intraoperative Cholangiogram   GASTRIC BYPASS  2011   bariatric surgery   HIATAL HERNIA REPAIR     IVC filter     recurrent DVT   KNEE ARTHROTOMY  1998   MASS EXCISION Right 11/24/2016   Procedure: EXCISION RIGHT AXILLARY LESION AND CHEST WALL LESION;  Surgeon: Luretha Murphy, MD;  Location: [facility];  Service: General;  Laterality: Right;   MASTECTOMY     Right   US ECHOCARDIOGRAPHY  10/27/08   EF 55-60%    ALLERGIES: Allergies  Allergen Reactions   Adhesive [Tape] Rash    FAMILY HISTORY:  Family History  Problem Relation Age of Onset   Sudden death Mother        car accident   Cancer Father 33       lung   Cancer Sister 3       lung cancer   Asthma Daughter    Colon cancer Neg Hx    Stomach cancer Neg Hx    Esophageal cancer Neg Hx    Colon polyps Neg Hx     SOCIAL HISTORY: Social History   Socioeconomic History   Marital status: Widowed    Spouse name: Not on file   Number of children: 2   Years of education: Not on file   Highest education level: Associate degree: occupational, Scientist, product/process development, or vocational program  Occupational History   Occupation: retired  Tobacco Use   Smoking status: Former    Current packs/day: 0.00    Average packs/day: 0.5 packs/day for 10.0 years (5.0 ttl pk-yrs)    Types: Cigarettes    Start date: 07/31/1968    Quit date: 07/31/1978    Years since quitting: 44.7   Smokeless tobacco: Never  Vaping Use   Vaping status: Never Used  Substance and Sexual Activity   Alcohol use:  No   Drug use: No   Sexual activity: Not Currently  Other Topics Concern   Not on file  Social History Narrative   Widowed with 2 children,  Lives in a one story home and her son and his wife recently moved in with her.  Retired Production designer, theatre/television/film for [employer].  Education: some college.    Social Determinants of Health   Financial Resource Strain:  Low Risk  (10/13/2022)   Overall Financial Resource Strain (CARDIA)    Difficulty of Paying Living Expenses: Not hard at all  Food Insecurity: No Food Insecurity (10/13/2022)   Hunger Vital Sign    Worried About Running Out of Food in the Last Year: Never true    Ran Out of Food in the Last Year: Never true  Transportation Needs: No Transportation Needs (10/13/2022)   PRAPARE - Administrator, Civil Service (Medical): No    Lack of Transportation (Non-Medical): No  Physical Activity: Sufficiently Active (10/13/2022)   Exercise Vital Sign    Days of Exercise per Week: 7 days    Minutes of Exercise per Session: 30 min  Recent Concern: Physical Activity - Insufficiently Active (10/05/2022)   Exercise Vital Sign    Days of Exercise per Week: 5 days    Minutes of Exercise per Session: 20 min  Stress: No Stress Concern Present (10/13/2022)   [employer] of Occupational Health - Occupational Stress Questionnaire    Feeling of Stress : Not at all  Social Connections: Moderately Integrated (10/13/2022)   Social Connection and Isolation Panel [NHANES]    Frequency of Communication with Friends and Family: More than three times a week    Frequency of Social Gatherings with Friends and Family: More than three times a week    Attends Religious Services: More than 4 times per year    Active Member of [employer] or Organizations: Yes    Attends Banker Meetings: More than 4 times per year    Marital Status: Widowed    MEDICATIONS:  Current Outpatient Medications  Medication Sig Dispense Refill   acarbose (PRECOSE) 50 MG tablet Take 50 mg by  mouth 3 (three) times daily.     albuterol (ACCUNEB) 0.63 MG/3ML nebulizer solution      albuterol (PROAIR HFA) 108 (90 Base) MCG/ACT inhaler Inhale 2 puffs into the lungs every 6 (six) hours as needed for wheezing or shortness of breath. 24 g 3   baclofen (LIORESAL) 20 MG tablet TAKE 1 TABLET THREE TIMES A DAY (UPDATED PRESCRIPTION) (Patient taking differently: Take 20 mg by mouth 3 (three) times daily.) 270 tablet 3   Blood Glucose Monitoring Suppl (FREESTYLE FREEDOM LITE) w/Device KIT Use as directed to check blood sugar once daily. 1 kit 0   Calcium Carb-Cholecalciferol (CALTRATE BONE HEALTH PO) Take 1 tablet by mouth daily.     Calcium Citrate (CALCITRATE PO) Take 1 tablet by mouth daily.     clobetasol cream (TEMOVATE) 0.05 % APPLY 1 APPLICATION TOPICALLY TWICE A DAY 120 g 5   Cyanocobalamin (VITAMIN B-12 PO) Take 1 tablet by mouth every other day.     dorzolamide-timolol (COSOPT) 2-0.5 % ophthalmic solution Place 1 drop into the left eye 2 (two) times daily.     Elastic Bandages & Supports (TRUFORM ARM SLEEVE L 15-20MMHG) MISC 1 Device by Does not apply route as directed. 1 each 1   fluticasone (FLONASE) 50 MCG/ACT nasal spray USE 2 SPRAYS IN EACH NOSTRIL DAILY (Patient taking differently: Place 1 spray into both nostrils 2 (two) times daily.) 48 g 3   fluticasone-salmeterol (ADVAIR DISKUS) 100-50 MCG/ACT AEPB USE 1 INHALATION EVERY 12 HOURS, RINSE MOUTH AFTER USING (Patient taking differently: Inhale 1 puff into the lungs 2 (two) times daily.) 180 each 3   FREESTYLE LITE test strip USE AS INSTRUCTED TO CHECK BLOOD SUGAR ONCE DAILY 100 strip 3   furosemide (LASIX) 40 MG tablet Take  1 tablet (40 mg total) by mouth 2 (two) times daily. 180 tablet 3   furosemide (LASIX) 40 MG tablet Take 1 tablet (40 mg total) by mouth 2 (two) times daily. 60 tablet 0   gabapentin (NEURONTIN) 600 MG tablet TAKE 1 TABLET THREE TIMES A DAY (DOSE CHANGE) 270 tablet 3   HYDROcodone-acetaminophen (NORCO) 5-325 MG  tablet Take 1 tablet by mouth 2 (two) times daily as needed for moderate pain. (Patient taking differently: Take 0.5 tablets by mouth 2 (two) times daily as needed for severe pain.) 50 tablet 0   Lancets (FREESTYLE) lancets USE AS INSTRUCTED TO CHECK BLOOD SUGAR ONCE DAILY 100 each 1   levothyroxine (SYNTHROID) 112 MCG tablet Take 1 tablet (112 mcg total) by mouth daily. 90 tablet 3   Magnesium 250 MG TABS Take 250 mg by mouth 2 (two) times daily.     omeprazole (PRILOSEC) 40 MG capsule Take 1 capsule (40 mg total) by mouth daily. 90 capsule 3   Polyvinyl Alcohol (LIQUID TEARS OP) Place 1 drop into the right eye 2 (two) times daily.     potassium chloride SA (KLOR-CON M) 20 MEQ tablet Take 2 tablets (40 mEq total) by mouth 2 (two) times daily. (Patient taking differently: Take 20 mEq by mouth 4 (four) times daily.) 360 tablet 3   pravastatin (PRAVACHOL) 10 MG tablet TAKE 1 TABLET DAILY (Patient taking differently: Take 10 mg by mouth daily.) 90 tablet 1   spironolactone (ALDACTONE) 100 MG tablet Take 100 mg by mouth daily.     temazepam (RESTORIL) 15 MG capsule Take 1 capsule (15 mg total) by mouth at bedtime as needed for sleep. 90 capsule 1   ursodiol (ACTIGALL) 300 MG capsule Take 2 capsules (600 mg total) by mouth 2 (two) times daily. 360 capsule 2   Vitamin D, Ergocalciferol, 50000 units CAPS Take 1 capsule by mouth once a week. (Patient taking differently: Take 50,000 Units by mouth every Friday.) 12 capsule 3   warfarin (COUMADIN) 5 MG tablet TAKE 2 TABLETS BY MOUTH DAILY OR AS DIRECTED BY ANTICOAGULATION CLINIC 205 tablet 3   No current facility-administered medications for this visit.    PHYSICAL EXAM: Vitals:   04/10/23 1042  BP: 120/65  Pulse: (!) 50  SpO2: 95%  Weight: 219 lb 6.4 oz (99.5 kg)  Height: 5\' 8"  (1.727 m)   Body mass index is 33.36 kg/m.  Wt Readings from Last 3 Encounters:  04/10/23 219 lb 6.4 oz (99.5 kg)  04/04/23 213 lb 14.4 oz (97 kg)  03/29/23 214 lb 6.4 oz  (97.3 kg)    General: Well developed, well nourished female in no apparent distress.  HEENT: AT/[place], no external lesions.  Eyes: Conjunctiva clear and no icterus. Neck: Neck supple  Lungs: Respirations not labored Neurologic: Alert, oriented, normal speech Extremities / Skin: Dry. No sores or rashes noted.  Psychiatric: Does not appear depressed or anxious  Diabetic Foot Exam - Simple   No data filed    LABS Reviewed Lab Results  Component Value Date   HGBA1C 7.1 (H) 04/05/2023   HGBA1C 7.0 (H) 01/11/2023   HGBA1C 7.1 (H) 01/04/2023   No results found for: "FRUCTOSAMINE" Lab Results  Component Value Date   CHOL 206 (H) 01/11/2023   HDL 79.30 01/11/2023   LDLCALC 113 (H) 01/11/2023   TRIG 64.0 01/11/2023   CHOLHDL 3 01/11/2023   Lab Results  Component Value Date   MICRALBCREAT 0.8 04/26/2021   MICRALBCREAT 1.5 12/16/2019  Lab Results  Component Value Date   CREATININE 1.18 04/05/2023   Lab Results  Component Value Date   GFR 45.24 (L) 04/05/2023    ASSESSMENT / PLAN  1. DM type 2 with diabetic peripheral neuropathy (HCC)   2. Stage 3b chronic kidney disease (HCC)   3. Hypoglycemia following gastrointestinal surgery   4. Acquired autoimmune hypothyroidism   5. Hypercholesterolemia   6. History of gastric surgery   7. Long term (current) use of oral hypoglycemic drugs     Diabetes Mellitus type 2, complicated by peripheral neuropathy/CKD - Diabetic status / severity: Fair control  Lab Results  Component Value Date   HGBA1C 7.1 (H) 04/05/2023    - Hemoglobin A1c goal : <7%  - Medications: No change  I) continue acarbose 50 mg with meals 3 times a day.  - Home glucose testing: In the morning fasting and few times a week in the afternoon and at bedtime. - Discussed/ Gave Hypoglycemia treatment plan.  # Consult : Refer to nephrology due to CKD.   # Annual urine for microalbuminuria/ creatinine ratio, no microalbuminuria currently.  Referred to  nephrology due to CKD 3B. Last  Lab Results  Component Value Date   MICRALBCREAT 0.8 04/26/2021    # Foot check nightly / neuropathy.  # Annual dilated diabetic eye exams.   - Diet: Make healthy diabetic food choices - Life style / activity / exercise: Discussed.  2. Blood pressure  -  BP Readings from Last 1 Encounters:  04/10/23 120/65    - Control is in target.  - No change in current plans.  3. Lipid status / Hyperlipidemia - Last  Lab Results  Component Value Date   LDLCALC 113 (H) 01/11/2023   - Continue pravastatin 10 mg daily.  Managed by primary care provider.  # Postprandial hypoglycemia related with history of gastric bypass surgery. -Continue acarbose 50 mg with meals 3 times a day.  # Primary hypothyroidism -Continue levothyroxine 112 mcg daily.   Diagnoses and all orders for this visit:  DM type 2 with diabetic peripheral neuropathy (HCC)  Stage 3b chronic kidney disease (HCC) -     Ambulatory referral to Nephrology  Hypoglycemia following gastrointestinal surgery  Acquired autoimmune hypothyroidism  Hypercholesterolemia  History of gastric surgery  Long term (current) use of oral hypoglycemic drugs    DISPOSITION Follow up in clinic in 3 months suggested.   All questions answered and patient verbalized understanding of the plan.  [place] Henretter Piekarski, MD [facility] Endocrinology [facility] [address] [place], [address] [place], [place] [address] Phone # [phone]  At least part of this note was generated using voice recognition software. Inadvertent word errors may have occurred, which were not recognized during the proofreading process.

## 2023-04-11 DIAGNOSIS — E1151 Type 2 diabetes mellitus with diabetic peripheral angiopathy without gangrene: Secondary | ICD-10-CM | POA: Diagnosis not present

## 2023-04-11 DIAGNOSIS — I872 Venous insufficiency (chronic) (peripheral): Secondary | ICD-10-CM | POA: Diagnosis not present

## 2023-04-11 DIAGNOSIS — E1142 Type 2 diabetes mellitus with diabetic polyneuropathy: Secondary | ICD-10-CM | POA: Diagnosis not present

## 2023-04-11 DIAGNOSIS — I890 Lymphedema, not elsewhere classified: Secondary | ICD-10-CM | POA: Diagnosis not present

## 2023-04-11 DIAGNOSIS — D649 Anemia, unspecified: Secondary | ICD-10-CM | POA: Diagnosis not present

## 2023-04-11 DIAGNOSIS — M5412 Radiculopathy, cervical region: Secondary | ICD-10-CM | POA: Insufficient documentation

## 2023-04-12 ENCOUNTER — Encounter: Payer: Self-pay | Admitting: Endocrinology

## 2023-04-12 DIAGNOSIS — E1142 Type 2 diabetes mellitus with diabetic polyneuropathy: Secondary | ICD-10-CM | POA: Diagnosis not present

## 2023-04-12 DIAGNOSIS — I872 Venous insufficiency (chronic) (peripheral): Secondary | ICD-10-CM | POA: Diagnosis not present

## 2023-04-12 DIAGNOSIS — E1151 Type 2 diabetes mellitus with diabetic peripheral angiopathy without gangrene: Secondary | ICD-10-CM | POA: Diagnosis not present

## 2023-04-12 DIAGNOSIS — D649 Anemia, unspecified: Secondary | ICD-10-CM | POA: Diagnosis not present

## 2023-04-12 DIAGNOSIS — I890 Lymphedema, not elsewhere classified: Secondary | ICD-10-CM | POA: Diagnosis not present

## 2023-04-16 ENCOUNTER — Telehealth: Payer: Self-pay | Admitting: Family Medicine

## 2023-04-16 DIAGNOSIS — I890 Lymphedema, not elsewhere classified: Secondary | ICD-10-CM | POA: Diagnosis not present

## 2023-04-16 DIAGNOSIS — I872 Venous insufficiency (chronic) (peripheral): Secondary | ICD-10-CM | POA: Diagnosis not present

## 2023-04-16 DIAGNOSIS — E1142 Type 2 diabetes mellitus with diabetic polyneuropathy: Secondary | ICD-10-CM | POA: Diagnosis not present

## 2023-04-16 DIAGNOSIS — E1151 Type 2 diabetes mellitus with diabetic peripheral angiopathy without gangrene: Secondary | ICD-10-CM | POA: Diagnosis not present

## 2023-04-16 DIAGNOSIS — D649 Anemia, unspecified: Secondary | ICD-10-CM | POA: Diagnosis not present

## 2023-04-16 NOTE — Telephone Encounter (Signed)
Rito Ehrlich from [facility] call and stated pt fell this morning and  hit her head and stated she is alright also pt stated she think a knot was coming up on her head but stated she don't need to be check out [place] stated the patient fell when she was opening the door.Rito Ehrlich # is [phone].

## 2023-04-16 NOTE — Telephone Encounter (Signed)
Family members need to continue monitoring for neurologic/mental status changes and/or headache. If any of these symptoms present, she has to seek immediate medical attention. Thanks, BJ

## 2023-04-17 NOTE — Telephone Encounter (Signed)
Called and spoke with patient, she is feeling sore all over and has bump on the side of the head, no headache or mental changes, patient is aware to seek medical attention if needed.

## 2023-04-18 DIAGNOSIS — I890 Lymphedema, not elsewhere classified: Secondary | ICD-10-CM | POA: Diagnosis not present

## 2023-04-18 DIAGNOSIS — E1151 Type 2 diabetes mellitus with diabetic peripheral angiopathy without gangrene: Secondary | ICD-10-CM | POA: Diagnosis not present

## 2023-04-18 DIAGNOSIS — E1142 Type 2 diabetes mellitus with diabetic polyneuropathy: Secondary | ICD-10-CM | POA: Diagnosis not present

## 2023-04-18 DIAGNOSIS — I872 Venous insufficiency (chronic) (peripheral): Secondary | ICD-10-CM | POA: Diagnosis not present

## 2023-04-18 DIAGNOSIS — D649 Anemia, unspecified: Secondary | ICD-10-CM | POA: Diagnosis not present

## 2023-04-19 DIAGNOSIS — D649 Anemia, unspecified: Secondary | ICD-10-CM | POA: Diagnosis not present

## 2023-04-19 DIAGNOSIS — I872 Venous insufficiency (chronic) (peripheral): Secondary | ICD-10-CM | POA: Diagnosis not present

## 2023-04-19 DIAGNOSIS — E1151 Type 2 diabetes mellitus with diabetic peripheral angiopathy without gangrene: Secondary | ICD-10-CM | POA: Diagnosis not present

## 2023-04-19 DIAGNOSIS — E1142 Type 2 diabetes mellitus with diabetic polyneuropathy: Secondary | ICD-10-CM | POA: Diagnosis not present

## 2023-04-19 DIAGNOSIS — I890 Lymphedema, not elsewhere classified: Secondary | ICD-10-CM | POA: Diagnosis not present

## 2023-04-24 DIAGNOSIS — E1151 Type 2 diabetes mellitus with diabetic peripheral angiopathy without gangrene: Secondary | ICD-10-CM | POA: Diagnosis not present

## 2023-04-24 DIAGNOSIS — I872 Venous insufficiency (chronic) (peripheral): Secondary | ICD-10-CM | POA: Diagnosis not present

## 2023-04-24 DIAGNOSIS — D649 Anemia, unspecified: Secondary | ICD-10-CM | POA: Diagnosis not present

## 2023-04-24 DIAGNOSIS — I890 Lymphedema, not elsewhere classified: Secondary | ICD-10-CM | POA: Diagnosis not present

## 2023-04-24 DIAGNOSIS — E1142 Type 2 diabetes mellitus with diabetic polyneuropathy: Secondary | ICD-10-CM | POA: Diagnosis not present

## 2023-04-26 DIAGNOSIS — H401123 Primary open-angle glaucoma, left eye, severe stage: Secondary | ICD-10-CM | POA: Diagnosis not present

## 2023-04-26 DIAGNOSIS — Z961 Presence of intraocular lens: Secondary | ICD-10-CM | POA: Diagnosis not present

## 2023-04-27 ENCOUNTER — Telehealth: Payer: Self-pay

## 2023-04-27 DIAGNOSIS — I872 Venous insufficiency (chronic) (peripheral): Secondary | ICD-10-CM | POA: Diagnosis not present

## 2023-04-27 DIAGNOSIS — E1142 Type 2 diabetes mellitus with diabetic polyneuropathy: Secondary | ICD-10-CM | POA: Diagnosis not present

## 2023-04-27 DIAGNOSIS — I890 Lymphedema, not elsewhere classified: Secondary | ICD-10-CM | POA: Diagnosis not present

## 2023-04-27 DIAGNOSIS — Z7901 Long term (current) use of anticoagulants: Secondary | ICD-10-CM

## 2023-04-27 DIAGNOSIS — E1151 Type 2 diabetes mellitus with diabetic peripheral angiopathy without gangrene: Secondary | ICD-10-CM | POA: Diagnosis not present

## 2023-04-27 DIAGNOSIS — D649 Anemia, unspecified: Secondary | ICD-10-CM | POA: Diagnosis not present

## 2023-04-27 NOTE — Telephone Encounter (Signed)
Per PCP, pt is tentatively scheduled for a lumbar epidural on 10/22. Pt will need a Lovenox bridge around the surgery date.    Surgery: Lumbar epidural Actual Wt: 99.8 kg Ideal Wt: 64 kg, actual is 156 % greater so adjusted wt is used to calculate CrCl Adjusted Wt: 78 kg  CrCl: 50.72 mL/min using adjusted wt CrCl ok for once daily Lovenox injections  Lovenox, 1.5mg /kg of actual wt= 150 mg daily  Current warfarin dosing; take 2 tablets (10 mg) daily except take 1 tablet (5 mg) on Sundays Next coumadin clinic apt is currently on 10/22. If surgery is on 10/22, will move apt up.   Lovenox Bridge:  10/17: Take last dose of warfarin 10/18: NO warfarin, NO Lovenox 10/19: NO warfarin, Lovenox injection in AM 10/20: NO warfarin, Lovenox injection in AM 10/21: NO warfarin, Lovenox injection in AM (BEFORE 7 AM)  10/22: SURGERY; NO WARFARIN, NO LOVENOX  10/23: Take 2 1/2 tablets (12.5 mg) warfarin, Lovenox injection in AM 10/24: Take 3 tablets (15 mg) warfarin, Lovenox injection in AM 10/25: Take 3 tablets (15 mg) warfarin, Lovenox injection in AM 10/26: Take 3 tablets (15 mg) warfarin, Lovenox injection in AM 10/27: Take 1 tablet (5 mg) warfarin, Lovenox injection in AM 10/28: Take 2 tablets (10 mg) warfarin, Lovenox injection in AM 140/29: Recheck INR; NO WARFARIN AND NO LOVENOX UNTIL AFTER INR CHECK

## 2023-04-29 DIAGNOSIS — E1151 Type 2 diabetes mellitus with diabetic peripheral angiopathy without gangrene: Secondary | ICD-10-CM | POA: Diagnosis not present

## 2023-04-29 DIAGNOSIS — Z9011 Acquired absence of right breast and nipple: Secondary | ICD-10-CM | POA: Diagnosis not present

## 2023-04-29 DIAGNOSIS — H5461 Unqualified visual loss, right eye, normal vision left eye: Secondary | ICD-10-CM | POA: Diagnosis not present

## 2023-04-29 DIAGNOSIS — E1142 Type 2 diabetes mellitus with diabetic polyneuropathy: Secondary | ICD-10-CM | POA: Diagnosis not present

## 2023-04-29 DIAGNOSIS — Z7901 Long term (current) use of anticoagulants: Secondary | ICD-10-CM | POA: Diagnosis not present

## 2023-04-29 DIAGNOSIS — Z6837 Body mass index (BMI) 37.0-37.9, adult: Secondary | ICD-10-CM | POA: Diagnosis not present

## 2023-04-29 DIAGNOSIS — H409 Unspecified glaucoma: Secondary | ICD-10-CM | POA: Diagnosis not present

## 2023-04-29 DIAGNOSIS — G8929 Other chronic pain: Secondary | ICD-10-CM | POA: Diagnosis not present

## 2023-04-29 DIAGNOSIS — E039 Hypothyroidism, unspecified: Secondary | ICD-10-CM | POA: Diagnosis not present

## 2023-04-29 DIAGNOSIS — I872 Venous insufficiency (chronic) (peripheral): Secondary | ICD-10-CM | POA: Diagnosis not present

## 2023-04-29 DIAGNOSIS — Z95828 Presence of other vascular implants and grafts: Secondary | ICD-10-CM | POA: Diagnosis not present

## 2023-04-29 DIAGNOSIS — M810 Age-related osteoporosis without current pathological fracture: Secondary | ICD-10-CM | POA: Diagnosis not present

## 2023-04-29 DIAGNOSIS — Z87891 Personal history of nicotine dependence: Secondary | ICD-10-CM | POA: Diagnosis not present

## 2023-04-29 DIAGNOSIS — Z853 Personal history of malignant neoplasm of breast: Secondary | ICD-10-CM | POA: Diagnosis not present

## 2023-04-29 DIAGNOSIS — M545 Low back pain, unspecified: Secondary | ICD-10-CM | POA: Diagnosis not present

## 2023-04-29 DIAGNOSIS — D649 Anemia, unspecified: Secondary | ICD-10-CM | POA: Diagnosis not present

## 2023-04-29 DIAGNOSIS — Z79891 Long term (current) use of opiate analgesic: Secondary | ICD-10-CM | POA: Diagnosis not present

## 2023-04-29 DIAGNOSIS — J45909 Unspecified asthma, uncomplicated: Secondary | ICD-10-CM | POA: Diagnosis not present

## 2023-04-29 DIAGNOSIS — I890 Lymphedema, not elsewhere classified: Secondary | ICD-10-CM | POA: Diagnosis not present

## 2023-04-29 DIAGNOSIS — M25512 Pain in left shoulder: Secondary | ICD-10-CM | POA: Diagnosis not present

## 2023-04-29 NOTE — Telephone Encounter (Signed)
Appreciate your assistance.  Agree with Lovenox bridge as previously listed for lumbar epidural 05/22/23.

## 2023-04-30 ENCOUNTER — Telehealth: Payer: Self-pay

## 2023-04-30 ENCOUNTER — Telehealth: Payer: Self-pay | Admitting: Internal Medicine

## 2023-04-30 NOTE — Telephone Encounter (Signed)
Pt reports her cortisone neck injection was RS for 10/10 and she will need INR checked that morning before 8 am.

## 2023-04-30 NOTE — Telephone Encounter (Signed)
Pt LVM; tried to reach pt but had to LVM>

## 2023-04-30 NOTE — Telephone Encounter (Signed)
Kelli Martinez [facility] 10/10/1945 [id number]  Dear Dr Salomon Fick   We have scheduled the above named patient for a(n) Endoscopy procedure. Our records show that (s)he is on anticoagulation therapy.  Please advise as to whether the patient may come off their therapy of Warfarin 5 days prior to their procedure which is rescheduled for 05/23/23. Thank you for your prompt reply.    Please route your response to [facility] or fax response to [phone].  Sincerely,    [facility]

## 2023-04-30 NOTE — Telephone Encounter (Signed)
PT is calling to find out when she needs to stop Coumadin prior to EGD on 10/3. Please advise.

## 2023-04-30 NOTE — Telephone Encounter (Signed)
Pt returned call.  Pt reports she is to have a neck injection tomorrow and an endoscopy with  Dr. Leonides Schanz on 10/3. She reports seh does not know if she was supposed to hold warfarin or not. Advised she needs to contact Dr. Ethelene Hal concerning the neck injection tomorrow and make sure it is ok with them to remain on warfarin for that injection. Advised Dr. Leonides Schanz has in their note from 9/3 that she will need to hold warfarin for 5 days. Pt reports she was not given instructions for that hold either but remembers her last EGD she had a lovenox bridge.  Advised pt the coumadin clinic was not advised of the EGD and that it should be RS for a later date so warfarin can be held and pt placed on a lovenox bridge.   Pt reports she is scheduled for her lumbar epidural for 10/22 and not 10/25. Advised there will be a lovenox bridge schedule for that procedure.  Pt reports she will contact both offices and then f/u with the coumadin clinic for further instructions. She will f/u tomorrow.

## 2023-04-30 NOTE — Telephone Encounter (Signed)
LVM requesting call back to RS coumadin clinic apt from 10/22 to 10/3 or 10/11, to educate on lovenox bridge.

## 2023-05-01 DIAGNOSIS — E1151 Type 2 diabetes mellitus with diabetic peripheral angiopathy without gangrene: Secondary | ICD-10-CM | POA: Diagnosis not present

## 2023-05-01 DIAGNOSIS — D649 Anemia, unspecified: Secondary | ICD-10-CM | POA: Diagnosis not present

## 2023-05-01 DIAGNOSIS — I872 Venous insufficiency (chronic) (peripheral): Secondary | ICD-10-CM | POA: Diagnosis not present

## 2023-05-01 DIAGNOSIS — I890 Lymphedema, not elsewhere classified: Secondary | ICD-10-CM | POA: Diagnosis not present

## 2023-05-01 DIAGNOSIS — E1142 Type 2 diabetes mellitus with diabetic polyneuropathy: Secondary | ICD-10-CM | POA: Diagnosis not present

## 2023-05-01 NOTE — Telephone Encounter (Signed)
Pt will be placed on a lovenox bridge for lumbar epidural on 10/22 and then EGD on 10/23.  Instructions for lovenox bridge will be explained to pt.

## 2023-05-01 NOTE — Telephone Encounter (Signed)
LVM

## 2023-05-01 NOTE — Telephone Encounter (Addendum)
EGD has been RS for 10/23. Will extend Lovenox bridge schedule designed for lumbar epidural on 10/22.   Updated Lovenox bridge schedule for lumbar epidural on 10/22 and EGD on 10/23.     10/17: Take last dose of warfarin 10/18: NO warfarin, NO Lovenox 10/19: NO warfarin, Lovenox injection in AM 10/20: NO warfarin, Lovenox injection in AM 10/21: NO warfarin, Lovenox injection in AM (BEFORE 7 AM)   10/22: SURGERY(lumbar epidural); NO WARFARIN, NO LOVENOX   10/23: SURGERY(EGD); NO WARFARIN, NO LOVENOX  10/24: Take 3  tablets (15 mg) warfarin, Lovenox injection in AM 10/25: Take 3 tablets (15 mg) warfarin, Lovenox injection in AM 10/26: Take 3 tablets (15 mg) warfarin, Lovenox injection in AM 10/27: Take 1 1/2  tablets (7.5 mg) warfarin, Lovenox injection in AM 10/28: Take 2 tablets (10 mg) warfarin, Lovenox injection in AM 10/29: Recheck INR; NO WARFARIN AND NO LOVENOX UNTIL AFTER INR CHECK

## 2023-05-01 NOTE — Telephone Encounter (Signed)
Pt returned call. She reports now that she was told by [facility] that she will need to hold warfarin for 5 days for her cervical injection also, that is now scheduled for 10/10.  Advised pt this nurse will contact [facility] to inquire what exactly is needed for her two procedures scheduled on 10/10 and 10/22. Pt was appreciative.    Contacted [facility] and spoke with Vance Gather, RN, who advised pt does have to hold warfarin for both procedures. She reports also that pt can have INR checked on 10/8 as late in the afternoon as possible. Shelley's number is [phone], ext [phone].    Contacted pt and advised. She will check with her daughter to make sure she can bring her to the coumadin clinic on 10/8 at 4:30. Advised new instructions for her Lovenox bridge will be completed and discussed with Dr. Salomon Fick. Advised pt this nurse will f/u with further instructions tomorrow. Pt appreciative of the help and verbalized understanding.

## 2023-05-01 NOTE — Telephone Encounter (Signed)
Stann Ore, LPN  You; Abbe Amsterdam R, MD2 hours ago (11:16 AM)    Patient has a endo procedure 05/23/2023, Gastroenterology is asking if the patient came come off of the warfarin 5 days prior to the procedure?    Sent msg to [employer], CMA concerning holding warfarin and Lovenox bridge.

## 2023-05-02 ENCOUNTER — Telehealth: Payer: Self-pay

## 2023-05-02 DIAGNOSIS — E1142 Type 2 diabetes mellitus with diabetic polyneuropathy: Secondary | ICD-10-CM | POA: Diagnosis not present

## 2023-05-02 DIAGNOSIS — I890 Lymphedema, not elsewhere classified: Secondary | ICD-10-CM | POA: Diagnosis not present

## 2023-05-02 DIAGNOSIS — I872 Venous insufficiency (chronic) (peripheral): Secondary | ICD-10-CM | POA: Diagnosis not present

## 2023-05-02 DIAGNOSIS — E1151 Type 2 diabetes mellitus with diabetic peripheral angiopathy without gangrene: Secondary | ICD-10-CM | POA: Diagnosis not present

## 2023-05-02 DIAGNOSIS — D649 Anemia, unspecified: Secondary | ICD-10-CM | POA: Diagnosis not present

## 2023-05-02 MED ORDER — ENOXAPARIN SODIUM 150 MG/ML IJ SOSY
150.0000 mg | PREFILLED_SYRINGE | INTRAMUSCULAR | 0 refills | Status: DC
Start: 2023-05-02 — End: 2023-09-20

## 2023-05-02 NOTE — Telephone Encounter (Signed)
Contacted pt and pt's daughter, Kelli Martinez, and advised of Lovenox bridge and extending it through all the surgeries. She cannot make it to the office for instructions due to no transportation. Pt and her daughter said they use mychart and can receive the instructions via msg. Advised pt and her daughter to look at all of the instructions and write down any questions they have and this nurse will contact both of them tomorrow or Friday to go over questions. They would like script sent to [facility], [place], [place]. Advised 20 syringes would be sent today to give the pharmacy time to order if necessary. Both verbalized understanding and were appreciative of the call.    Sent in Lovenox syringes.

## 2023-05-02 NOTE — Telephone Encounter (Signed)
Discussed with PCP, she agrees with extending Lovenox bridge between surgery on 10/10 and surgeries on 10/22 and 10/23. So, pt will not be taken off of warfarin and then placed back on warfarin between surgeries. Pt will have an extended Lovenox bridge and will not restart warfarin until after her final surgery.   Lovenox Bridge for all surgeries;  10/5: Take last dose of warfarin 10/6: NO warfarin, NO Lovenox 10/7: NO warfarin, Lovenox injection in AM 10/8: NO warfarin, Lovenox injection in AM 10/9: NO warfarin, Lovenox injection in AM(BEFORE 7AM)   10/10: SURGERY (cervical epidural); NO WARFARIN, NO LOVENOX  10/11: NO warfarin, Lovenox injection in AM 10/12: NO warfarin, Lovenox injection in AM 10/13: NO warfarin, Lovenox injection in AM 10/14: NO warfarin, Lovenox injection in AM 10/15: NO warfarin, Lovenox injection in AM 10/16: NO warfarin, Lovenox injection in AM 10/17: NO warfarin, Lovenox injection in AM 10/18: NO warfarin, Lovenox injection in AM 10/19: NO warfarin, Lovenox injection in AM 10/20: NO warfarin, Lovenox injection in AM 10/21: NO warfarin, Lovenox injection in AM (BEFORE 7 AM)   10/22: SURGERY(lumbar epidural); NO WARFARIN, NO LOVENOX   10/23: SURGERY(EGD); NO WARFARIN, NO LOVENOX   10/24: Take 3  tablets (15 mg) warfarin, Lovenox injection in AM 10/25: Take 3 tablets (15 mg) warfarin, Lovenox injection in AM 10/26: Take 3 tablets (15 mg) warfarin, Lovenox injection in AM 10/27: Take 1 1/2  tablets (7.5 mg) warfarin, Lovenox injection in AM 10/28: Take 2 tablets (10 mg) warfarin, Lovenox injection in AM 10/29: Recheck INR; NO WARFARIN AND NO LOVENOX UNTIL AFTER INR CHECK

## 2023-05-02 NOTE — Telephone Encounter (Signed)
Cervical Injection on 10/10; Lovenox bridge schedule  10/5: Take last dose of warfarin 10/6: NO warfarin, NO Lovenox 10/7: NO warfarin, Lovenox injection in AM 10/8: NO warfarin, Lovenox injection in AM 10/9: NO warfarin, Lovenox injection in AM(BEFORE 7AM)  10/10: SURGERY (cervical epidural); NO WARFARIN, NO LOVENOX  If we restart warfarin on 10/11, pt will be on warfarin for one week, until 10/18, and then stop warfarin again for the other two surgeries scheduled for 10/22 and 10/23.   Recommendation is to continue hold of warfarin between surgery on 10/10 and other surgeries on 10/22 and 10/23. Continue Lovenox injections between surgeries.

## 2023-05-02 NOTE — Addendum Note (Signed)
Addended by: Sherrie George A on: 05/02/2023 01:42 PM   Modules accepted: Orders

## 2023-05-02 NOTE — Telephone Encounter (Signed)
Spoke to patient daughter advised okay to hold Warfarin 5 days before procedure. Advised coumadin clinic will contact patient to explain Lovenox bridge

## 2023-05-03 ENCOUNTER — Encounter: Payer: Medicare Other | Admitting: Internal Medicine

## 2023-05-03 DIAGNOSIS — D649 Anemia, unspecified: Secondary | ICD-10-CM | POA: Diagnosis not present

## 2023-05-03 DIAGNOSIS — E1142 Type 2 diabetes mellitus with diabetic polyneuropathy: Secondary | ICD-10-CM | POA: Diagnosis not present

## 2023-05-03 DIAGNOSIS — I890 Lymphedema, not elsewhere classified: Secondary | ICD-10-CM | POA: Diagnosis not present

## 2023-05-03 DIAGNOSIS — I872 Venous insufficiency (chronic) (peripheral): Secondary | ICD-10-CM | POA: Diagnosis not present

## 2023-05-03 DIAGNOSIS — E1151 Type 2 diabetes mellitus with diabetic peripheral angiopathy without gangrene: Secondary | ICD-10-CM | POA: Diagnosis not present

## 2023-05-04 NOTE — Telephone Encounter (Signed)
Pt reports the Lovenox prescription was sent to wrong [facility] on [address], in [place]. And the other [facility] will not forward it.  She would like it sent to [facility], [address] [place].   Tried to contact pt but had to LVM.   Pt left another VM reporting the other pharmacy did get the script from the other [facility] and her script is ready for pickup.

## 2023-05-04 NOTE — Telephone Encounter (Signed)
Tried to contact pt several times today but always went straight to VM. Pt tried to call back several times but was unable to get the call at the time.   Last VM pt left requested if anything is needed to send her a text. Unable to send a text but will send a mychart msg to inquire if she has any questions concerning the Lovenox bridge.

## 2023-05-08 ENCOUNTER — Ambulatory Visit (INDEPENDENT_AMBULATORY_CARE_PROVIDER_SITE_OTHER): Payer: Medicare Other

## 2023-05-08 DIAGNOSIS — Z7901 Long term (current) use of anticoagulants: Secondary | ICD-10-CM

## 2023-05-08 LAB — POCT INR: INR: 1.4 — AB (ref 2.0–3.0)

## 2023-05-08 NOTE — Patient Instructions (Addendum)
Pre visit review using our clinic review tool, if applicable. No additional management support is needed unless otherwise documented below in the visit note.  10/5: Take last dose of warfarin 10/6: NO warfarin, NO Lovenox 10/7: NO warfarin, Lovenox injection in AM 10/8: NO warfarin, Lovenox injection in AM 10/9: NO warfarin, Lovenox injection in AM(BEFORE 7AM)   10/10: SURGERY (cervical epidural); NO WARFARIN, NO LOVENOX   10/11: NO warfarin, Lovenox injection in AM 10/12: NO warfarin, Lovenox injection in AM 10/13: NO warfarin, Lovenox injection in AM 10/14: NO warfarin, Lovenox injection in AM 10/15: NO warfarin, Lovenox injection in AM 10/16: NO warfarin, Lovenox injection in AM 10/17: NO warfarin, Lovenox injection in AM 10/18: NO warfarin, Lovenox injection in AM 10/19: NO warfarin, Lovenox injection in AM 10/20: NO warfarin, Lovenox injection in AM 10/21: NO warfarin, Lovenox injection in AM (BEFORE 7 AM)   10/22: SURGERY(lumbar epidural); NO WARFARIN, NO LOVENOX   10/23: SURGERY(EGD); NO WARFARIN, NO LOVENOX   10/24: Take 3  tablets (15 mg) warfarin, Lovenox injection in AM 10/25: Take 3 tablets (15 mg) warfarin, Lovenox injection in AM 10/26: Take 3 tablets (15 mg) warfarin, Lovenox injection in AM 10/27: Take 1 1/2  tablets (7.5 mg) warfarin, Lovenox injection in AM 10/28: Take 2 tablets (10 mg) warfarin, Lovenox injection in AM 10/29: Recheck INR; NO WARFARIN AND NO LOVENOX UNTIL AFTER INR CHECK

## 2023-05-08 NOTE — Progress Notes (Signed)
Pt has been placed on a lovenox bridge for 3 surgeries scheduled for 10/10, 10/22 and 10/23. Pt is in today for [facility] requesting INR reading before first procedure scheduled for 10/10.  LVM for Vance Gather at [facility] with INR result from today. [phone], ext [phone])   10/5: Take last dose of warfarin 10/6: NO warfarin, NO Lovenox 10/7: NO warfarin, Lovenox injection in AM 10/8: NO warfarin, Lovenox injection in AM 10/9: NO warfarin, Lovenox injection in AM(BEFORE 7AM)   10/10: SURGERY (cervical epidural); NO WARFARIN, NO LOVENOX   10/11: NO warfarin, Lovenox injection in AM 10/12: NO warfarin, Lovenox injection in AM 10/13: NO warfarin, Lovenox injection in AM 10/14: NO warfarin, Lovenox injection in AM 10/15: NO warfarin, Lovenox injection in AM 10/16: NO warfarin, Lovenox injection in AM 10/17: NO warfarin, Lovenox injection in AM 10/18: NO warfarin, Lovenox injection in AM 10/19: NO warfarin, Lovenox injection in AM 10/20: NO warfarin, Lovenox injection in AM 10/21: NO warfarin, Lovenox injection in AM (BEFORE 7 AM)   10/22: SURGERY(lumbar epidural); NO WARFARIN, NO LOVENOX   10/23: SURGERY(EGD); NO WARFARIN, NO LOVENOX   10/24: Take 3  tablets (15 mg) warfarin, Lovenox injection in AM 10/25: Take 3 tablets (15 mg) warfarin, Lovenox injection in AM 10/26: Take 3 tablets (15 mg) warfarin, Lovenox injection in AM 10/27: Take 1 1/2  tablets (7.5 mg) warfarin, Lovenox injection in AM 10/28: Take 2 tablets (10 mg) warfarin, Lovenox injection in AM 10/29: Recheck INR; NO WARFARIN AND NO LOVENOX UNTIL AFTER INR CHECK

## 2023-05-09 DIAGNOSIS — I872 Venous insufficiency (chronic) (peripheral): Secondary | ICD-10-CM | POA: Diagnosis not present

## 2023-05-09 DIAGNOSIS — I890 Lymphedema, not elsewhere classified: Secondary | ICD-10-CM | POA: Diagnosis not present

## 2023-05-09 DIAGNOSIS — E1151 Type 2 diabetes mellitus with diabetic peripheral angiopathy without gangrene: Secondary | ICD-10-CM | POA: Diagnosis not present

## 2023-05-09 DIAGNOSIS — D649 Anemia, unspecified: Secondary | ICD-10-CM | POA: Diagnosis not present

## 2023-05-09 DIAGNOSIS — E1142 Type 2 diabetes mellitus with diabetic polyneuropathy: Secondary | ICD-10-CM | POA: Diagnosis not present

## 2023-05-09 NOTE — Telephone Encounter (Signed)
Pt was in for INR check yesterday and all questions were answered.

## 2023-05-10 DIAGNOSIS — M5412 Radiculopathy, cervical region: Secondary | ICD-10-CM | POA: Diagnosis not present

## 2023-05-14 DIAGNOSIS — I890 Lymphedema, not elsewhere classified: Secondary | ICD-10-CM | POA: Diagnosis not present

## 2023-05-14 DIAGNOSIS — E1151 Type 2 diabetes mellitus with diabetic peripheral angiopathy without gangrene: Secondary | ICD-10-CM | POA: Diagnosis not present

## 2023-05-14 DIAGNOSIS — D649 Anemia, unspecified: Secondary | ICD-10-CM | POA: Diagnosis not present

## 2023-05-14 DIAGNOSIS — E1142 Type 2 diabetes mellitus with diabetic polyneuropathy: Secondary | ICD-10-CM | POA: Diagnosis not present

## 2023-05-14 DIAGNOSIS — I872 Venous insufficiency (chronic) (peripheral): Secondary | ICD-10-CM | POA: Diagnosis not present

## 2023-05-20 ENCOUNTER — Encounter: Payer: Self-pay | Admitting: Certified Registered Nurse Anesthetist

## 2023-05-21 NOTE — Telephone Encounter (Signed)
Thanks for the update.  I agree with having pt continue Lovenox bridge as previously discussed and not restarting warfarin at this time.

## 2023-05-21 NOTE — Telephone Encounter (Signed)
Pt returned call. She reports after her cervical injection on 10/10, Dr. Ethelene Hal told her she could restart warfarin because he said she would not need to hold the warfarin for her lumbar injection on 10/22. Pt did not restart warfarin because she knew she would have to stop it for the endoscopy scheduled on 10/23. Pt has followed original instructions given for lovenox bridge. Advised if any changes to contact the coumadin clinic. Pt verbalized understanding.

## 2023-05-21 NOTE — Telephone Encounter (Signed)
Pt LVM this morning reporting she cannot find the lovenox bridge instructions on her phone anymore and would like them resent.   Sent instructions via mychart, which is how pt has gotten the instructions the last time.   LVM that instructions were sent via mychart.

## 2023-05-22 ENCOUNTER — Ambulatory Visit: Payer: Medicare Other

## 2023-05-22 DIAGNOSIS — M5416 Radiculopathy, lumbar region: Secondary | ICD-10-CM | POA: Diagnosis not present

## 2023-05-23 ENCOUNTER — Ambulatory Visit: Payer: Medicare Other | Admitting: Internal Medicine

## 2023-05-23 ENCOUNTER — Other Ambulatory Visit: Payer: Self-pay | Admitting: Internal Medicine

## 2023-05-23 ENCOUNTER — Encounter: Payer: Self-pay | Admitting: Internal Medicine

## 2023-05-23 VITALS — BP 152/69 | HR 58 | Temp 97.5°F | Resp 10 | Ht 68.0 in | Wt 220.0 lb

## 2023-05-23 DIAGNOSIS — B3781 Candidal esophagitis: Secondary | ICD-10-CM | POA: Diagnosis not present

## 2023-05-23 DIAGNOSIS — K317 Polyp of stomach and duodenum: Secondary | ICD-10-CM

## 2023-05-23 DIAGNOSIS — Z9884 Bariatric surgery status: Secondary | ICD-10-CM | POA: Diagnosis not present

## 2023-05-23 DIAGNOSIS — J45909 Unspecified asthma, uncomplicated: Secondary | ICD-10-CM | POA: Diagnosis not present

## 2023-05-23 DIAGNOSIS — E119 Type 2 diabetes mellitus without complications: Secondary | ICD-10-CM | POA: Diagnosis not present

## 2023-05-23 MED ORDER — SODIUM CHLORIDE 0.9 % IV SOLN: 500.0000 mL | Freq: Once | INTRAVENOUS | Status: DC

## 2023-05-23 NOTE — Progress Notes (Signed)
Report given to PACU, vss 

## 2023-05-23 NOTE — Progress Notes (Signed)
GASTROENTEROLOGY PROCEDURE H&P NOTE   Primary Care Physician: Deeann Saint, MD    Reason for Procedure:   History of gastric hyperplastic polyps  Plan:    EGD  Patient is appropriate for endoscopic procedure(s) in the ambulatory ([facility]) setting.  The nature of the procedure, as well as the risks, benefits, and alternatives were carefully and thoroughly reviewed with the patient. Ample time for discussion and questions allowed. The patient understood, was satisfied, and agreed to proceed.     HPI: Kelli Martinez is a 77 y.o. female who presents for EGD for evaluation of history of gastric hyperplastic polyps .  Patient was most recently seen in the Gastroenterology Clinic on 04/10/23.  No interval change in medical history since that appointment. Please refer to that note for full details regarding GI history and clinical presentation.   Past Medical History:  Diagnosis Date   Allergy    Anemia    Arthritis    Asthma    Back pain, chronic    GETS INJECTIONS IN BACK   Blind right eye    hemorrhage   Blood transfusion    Cancer (HCC)    breast 1994   Clotting disorder (HCC)    Diabetes mellitus without complication (HCC)    takes precose   Elevated liver enzymes    Gallstones    GERD (gastroesophageal reflux disease)    HX: breast cancer    Hyperlipidemia    Hypertension    Hypothyroid    Lymphedema of arm    RT   OSA (obstructive sleep apnea)    has not used in 2 years-lost weight   Osteoporosis    Peripheral neuropathy    on gabapentin   PONV (postoperative nausea and vomiting)    Psoriasis    Pulmonary embolism (HCC) 1998 / 1994 /1968   Syncope and collapse 2011   due to anemia    Past Surgical History:  Procedure Laterality Date   ABDOMINAL HYSTERECTOMY     BONE MARROW TRANSPLANT  1994   CARDIOVASCULAR STRESS TEST  05/23/2005   EF 53%   carpal tunnell     bil   cataracts     CHOLECYSTECTOMY  05/10/2012   Procedure: LAPAROSCOPIC  CHOLECYSTECTOMY WITH INTRAOPERATIVE CHOLANGIOGRAM;  Surgeon: Valarie Merino, MD;  Location: [facility];  Service: General;  Laterality: N/A;  Laparoscopic Cholecystectomy with Intraoperative Cholangiogram   GASTRIC BYPASS  2011   bariatric surgery   HIATAL HERNIA REPAIR     IVC filter     recurrent DVT   KNEE ARTHROTOMY  1998   MASS EXCISION Right 11/24/2016   Procedure: EXCISION RIGHT AXILLARY LESION AND CHEST WALL LESION;  Surgeon: Luretha Murphy, MD;  Location: [facility];  Service: General;  Laterality: Right;   MASTECTOMY     Right   US ECHOCARDIOGRAPHY  10/27/08   EF 55-60%    Prior to Admission medications   Medication Sig Start Date End Date Taking? Authorizing Provider  acarbose (PRECOSE) 50 MG tablet Take 50 mg by mouth 3 (three) times daily. 03/05/23  Yes [provider]  albuterol (ACCUNEB) 0.63 MG/3ML nebulizer solution    Yes [provider]  albuterol (PROAIR HFA) 108 (90 Base) MCG/ACT inhaler Inhale 2 puffs into the lungs every 6 (six) hours as needed for wheezing or shortness of breath. 09/14/22  Yes Young, Rennis Chris, MD  baclofen (LIORESAL) 20 MG tablet TAKE 1 TABLET THREE TIMES A DAY (UPDATED PRESCRIPTION) Patient taking  differently: Take 20 mg by mouth 3 (three) times daily. 05/03/22  Yes Deeann Saint, MD  Blood Glucose Monitoring Suppl (FREESTYLE FREEDOM LITE) w/Device KIT Use as directed to check blood sugar once daily. 02/20/19  Yes [name], MD  Calcium Carb-Cholecalciferol (CALTRATE BONE HEALTH PO) Take 1 tablet by mouth daily.   Yes [provider]  Calcium Citrate (CALCITRATE PO) Take 1 tablet by mouth daily.   Yes [provider]  clobetasol cream (TEMOVATE) 0.05 % APPLY 1 APPLICATION TOPICALLY TWICE A DAY 02/26/23  Yes Deeann Saint, MD  Cyanocobalamin (VITAMIN B-12 PO) Take 1 tablet by mouth every other day.   Yes [provider]  dorzolamide-timolol (COSOPT) 2-0.5 % ophthalmic solution Place 1 drop  into the left eye 2 (two) times daily.   Yes [provider]  Elastic Bandages & Supports (TRUFORM ARM SLEEVE L 15-20MMHG) MISC 1 Device by Does not apply route as directed. 03/29/23  Yes Deeann Saint, MD  enoxaparin (LOVENOX) 150 MG/ML injection Inject 1 mL (150 mg total) into the skin daily. AS DIRECTED BY ANTICOAGULATION CLINIC 05/02/23  Yes Deeann Saint, MD  fluticasone (FLONASE) 50 MCG/ACT nasal spray USE 2 SPRAYS IN EACH NOSTRIL DAILY Patient taking differently: Place 1 spray into both nostrils 2 (two) times daily. 11/20/22  Yes Young, Clinton D, MD  fluticasone-salmeterol (ADVAIR DISKUS) 100-50 MCG/ACT AEPB USE 1 INHALATION EVERY 12 HOURS, RINSE MOUTH AFTER USING Patient taking differently: Inhale 1 puff into the lungs 2 (two) times daily. 09/14/22  Yes Young, Joni Fears D, MD  FREESTYLE LITE test strip USE AS INSTRUCTED TO CHECK BLOOD SUGAR ONCE DAILY 08/09/22  Yes [name], MD  furosemide (LASIX) 40 MG tablet Take 1 tablet (40 mg total) by mouth 2 (two) times daily. 01/15/23  Yes Deeann Saint, MD  furosemide (LASIX) 40 MG tablet Take 1 tablet (40 mg total) by mouth 2 (two) times daily. 01/15/23  Yes Deeann Saint, MD  gabapentin (NEURONTIN) 600 MG tablet TAKE 1 TABLET THREE TIMES A DAY (DOSE CHANGE) 02/26/23  Yes Deeann Saint, MD  HYDROcodone-acetaminophen (NORCO) 5-325 MG tablet Take 1 tablet by mouth 2 (two) times daily as needed for moderate pain. Patient taking differently: Take 0.5 tablets by mouth 2 (two) times daily as needed for severe pain (pain score 7-10). 04/12/18  Yes [name], MD  Lancets (FREESTYLE) lancets USE AS INSTRUCTED TO CHECK BLOOD SUGAR ONCE DAILY 09/23/21  Yes [name], MD  levothyroxine (SYNTHROID) 112 MCG tablet Take 1 tablet (112 mcg total) by mouth daily. 01/16/23  Yes [name], MD  Magnesium 250 MG TABS Take 250 mg by mouth 2 (two) times daily.   Yes [provider]  omeprazole (PRILOSEC) 40 MG capsule Take 1 capsule (40 mg  total) by mouth daily. 04/10/23  Yes Imogene Burn, MD  Polyvinyl Alcohol (LIQUID TEARS OP) Place 1 drop into the right eye 2 (two) times daily.   Yes [provider]  potassium chloride SA (KLOR-CON M) 20 MEQ tablet Take 2 tablets (40 mEq total) by mouth 2 (two) times daily. Patient taking differently: Take 20 mEq by mouth 4 (four) times daily. 09/07/22  Yes Deeann Saint, MD  pravastatin (PRAVACHOL) 10 MG tablet TAKE 1 TABLET DAILY Patient taking differently: Take 10 mg by mouth daily. 12/28/22  Yes Deeann Saint, MD  spironolactone (ALDACTONE) 100 MG tablet Take 100 mg by mouth daily. 03/05/23  Yes [provider]  temazepam (RESTORIL) 15  MG capsule Take 1 capsule (15 mg total) by mouth at bedtime as needed for sleep. 03/06/23  Yes Young, Joni Fears D, MD  ursodiol (ACTIGALL) 300 MG capsule TAKE 2 CAPSULES TWICE A DAY 05/23/23  Yes Imogene Burn, MD  Vitamin D, Ergocalciferol, 50000 units CAPS Take 1 capsule by mouth once a week. Patient taking differently: Take 50,000 Units by mouth every Friday. 05/09/22  Yes [name], MD  warfarin (COUMADIN) 5 MG tablet TAKE 2 TABLETS BY MOUTH DAILY OR AS DIRECTED BY ANTICOAGULATION CLINIC 03/12/23   Deeann Saint, MD    Current Outpatient Medications  Medication Sig Dispense Refill   acarbose (PRECOSE) 50 MG tablet Take 50 mg by mouth 3 (three) times daily.     albuterol (ACCUNEB) 0.63 MG/3ML nebulizer solution      albuterol (PROAIR HFA) 108 (90 Base) MCG/ACT inhaler Inhale 2 puffs into the lungs every 6 (six) hours as needed for wheezing or shortness of breath. 24 g 3   baclofen (LIORESAL) 20 MG tablet TAKE 1 TABLET THREE TIMES A DAY (UPDATED PRESCRIPTION) (Patient taking differently: Take 20 mg by mouth 3 (three) times daily.) 270 tablet 3   Blood Glucose Monitoring Suppl (FREESTYLE FREEDOM LITE) w/Device KIT Use as directed to check blood sugar once daily. 1 kit 0   Calcium Carb-Cholecalciferol (CALTRATE BONE HEALTH PO) Take 1  tablet by mouth daily.     Calcium Citrate (CALCITRATE PO) Take 1 tablet by mouth daily.     clobetasol cream (TEMOVATE) 0.05 % APPLY 1 APPLICATION TOPICALLY TWICE A DAY 120 g 5   Cyanocobalamin (VITAMIN B-12 PO) Take 1 tablet by mouth every other day.     dorzolamide-timolol (COSOPT) 2-0.5 % ophthalmic solution Place 1 drop into the left eye 2 (two) times daily.     Elastic Bandages & Supports (TRUFORM ARM SLEEVE L 15-20MMHG) MISC 1 Device by Does not apply route as directed. 1 each 1   enoxaparin (LOVENOX) 150 MG/ML injection Inject 1 mL (150 mg total) into the skin daily. AS DIRECTED BY ANTICOAGULATION CLINIC 20 mL 0   fluticasone (FLONASE) 50 MCG/ACT nasal spray USE 2 SPRAYS IN EACH NOSTRIL DAILY (Patient taking differently: Place 1 spray into both nostrils 2 (two) times daily.) 48 g 3   fluticasone-salmeterol (ADVAIR DISKUS) 100-50 MCG/ACT AEPB USE 1 INHALATION EVERY 12 HOURS, RINSE MOUTH AFTER USING (Patient taking differently: Inhale 1 puff into the lungs 2 (two) times daily.) 180 each 3   FREESTYLE LITE test strip USE AS INSTRUCTED TO CHECK BLOOD SUGAR ONCE DAILY 100 strip 3   furosemide (LASIX) 40 MG tablet Take 1 tablet (40 mg total) by mouth 2 (two) times daily. 180 tablet 3   furosemide (LASIX) 40 MG tablet Take 1 tablet (40 mg total) by mouth 2 (two) times daily. 60 tablet 0   gabapentin (NEURONTIN) 600 MG tablet TAKE 1 TABLET THREE TIMES A DAY (DOSE CHANGE) 270 tablet 3   HYDROcodone-acetaminophen (NORCO) 5-325 MG tablet Take 1 tablet by mouth 2 (two) times daily as needed for moderate pain. (Patient taking differently: Take 0.5 tablets by mouth 2 (two) times daily as needed for severe pain (pain score 7-10).) 50 tablet 0   Lancets (FREESTYLE) lancets USE AS INSTRUCTED TO CHECK BLOOD SUGAR ONCE DAILY 100 each 1   levothyroxine (SYNTHROID) 112 MCG tablet Take 1 tablet (112 mcg total) by mouth daily. 90 tablet 3   Magnesium 250 MG TABS Take 250 mg by mouth 2 (two) times daily.  omeprazole (PRILOSEC) 40 MG capsule Take 1 capsule (40 mg total) by mouth daily.     Polyvinyl Alcohol (LIQUID TEARS OP) Place 1 drop into the right eye 2 (two) times daily.     potassium chloride SA (KLOR-CON M) 20 MEQ tablet Take 2 tablets (40 mEq total) by mouth 2 (two) times daily. (Patient taking differently: Take 20 mEq by mouth 4 (four) times daily.) 360 tablet 3   pravastatin (PRAVACHOL) 10 MG tablet TAKE 1 TABLET DAILY (Patient taking differently: Take 10 mg by mouth daily.) 90 tablet 1   spironolactone (ALDACTONE) 100 MG tablet Take 100 mg by mouth daily.     temazepam (RESTORIL) 15 MG capsule Take 1 capsule (15 mg total) by mouth at bedtime as needed for sleep. 90 capsule 1   ursodiol (ACTIGALL) 300 MG capsule TAKE 2 CAPSULES TWICE A DAY 360 capsule 3   Vitamin D, Ergocalciferol, 50000 units CAPS Take 1 capsule by mouth once a week. (Patient taking differently: Take 50,000 Units by mouth every Friday.) 12 capsule 3   warfarin (COUMADIN) 5 MG tablet TAKE 2 TABLETS BY MOUTH DAILY OR AS DIRECTED BY ANTICOAGULATION CLINIC 205 tablet 3   Current Facility-Administered Medications  Medication Dose Route Frequency Provider Last Rate Last Admin   0.9 %  sodium chloride infusion  500 mL Intravenous Once Imogene Burn, MD        Allergies as of 05/23/2023 - Review Complete 05/23/2023  Allergen Reaction Noted   Adhesive [tape] Rash 04/23/2012    Family History  Problem Relation Age of Onset   Sudden death Mother        car accident   Cancer Father 20       lung   Cancer Sister 74       lung cancer   Asthma Daughter    Colon cancer Neg Hx    Stomach cancer Neg Hx    Esophageal cancer Neg Hx    Colon polyps Neg Hx     Social History   Socioeconomic History   Marital status: Widowed    Spouse name: Not on file   Number of children: 2   Years of education: Not on file   Highest education level: Associate degree: occupational, Scientist, product/process development, or vocational program  Occupational  History   Occupation: retired  Tobacco Use   Smoking status: Former    Current packs/day: 0.00    Average packs/day: 0.5 packs/day for 10.0 years (5.0 ttl pk-yrs)    Types: Cigarettes    Start date: 07/31/1968    Quit date: 07/31/1978    Years since quitting: 44.8   Smokeless tobacco: Never  Vaping Use   Vaping status: Never Used  Substance and Sexual Activity   Alcohol use: No   Drug use: No   Sexual activity: Not Currently  Other Topics Concern   Not on file  Social History Narrative   Widowed with 2 children,  Lives in a one story home and her son and his wife recently moved in with her.  Retired Production designer, theatre/television/film for [employer].  Education: some college.    Social Determinants of Health   Financial Resource Strain: Low Risk  (10/13/2022)   Overall Financial Resource Strain (CARDIA)    Difficulty of Paying Living Expenses: Not hard at all  Food Insecurity: No Food Insecurity (10/13/2022)   Hunger Vital Sign    Worried About Running Out of Food in the Last Year: Never true    Ran Out of Food in  the Last Year: Never true  Transportation Needs: No Transportation Needs (10/13/2022)   PRAPARE - Administrator, Civil Service (Medical): No    Lack of Transportation (Non-Medical): No  Physical Activity: Sufficiently Active (10/13/2022)   Exercise Vital Sign    Days of Exercise per Week: 7 days    Minutes of Exercise per Session: 30 min  Recent Concern: Physical Activity - Insufficiently Active (10/05/2022)   Exercise Vital Sign    Days of Exercise per Week: 5 days    Minutes of Exercise per Session: 20 min  Stress: No Stress Concern Present (10/13/2022)   [employer] of Occupational Health - Occupational Stress Questionnaire    Feeling of Stress : Not at all  Social Connections: Moderately Integrated (10/13/2022)   Social Connection and Isolation Panel [NHANES]    Frequency of Communication with Friends and Family: More than three times a week    Frequency of Social Gatherings with  Friends and Family: More than three times a week    Attends Religious Services: More than 4 times per year    Active Member of [employer] or Organizations: Yes    Attends Banker Meetings: More than 4 times per year    Marital Status: Widowed  Intimate Partner Violence: Not At Risk (10/13/2022)   Humiliation, Afraid, Rape, and Kick questionnaire    Fear of Current or Ex-Partner: No    Emotionally Abused: No    Physically Abused: No    Sexually Abused: No    Physical Exam: Vital signs in last 24 hours: BP 138/71   Pulse (!) 57   Temp (!) 97.5 F (36.4 C) (Skin)   Ht 5\' 8"  (1.727 m)   Wt 220 lb (99.8 kg)   SpO2 100%   BMI 33.45 kg/m  GEN: NAD EYE: Sclerae anicteric ENT: MMM CV: Non-tachycardic Pulm: No increased WOB GI: Soft NEURO:  Alert & Oriented   Eulah Pont, MD [facility]   05/23/2023 10:24 AM

## 2023-05-23 NOTE — Patient Instructions (Signed)
Resume Warfarin an Lovenox as instructed by cardiologist YOU HAD AN ENDOSCOPIC PROCEDURE TODAY: Refer to the procedure report and other information in the discharge instructions given to you for any specific questions about what was found during the examination. If this information does not answer your questions, please call [place] office at [phone] to clarify.   YOU SHOULD EXPECT: Some feelings of bloating in the abdomen. Passage of more gas than usual. Walking can help get rid of the air that was put into your GI tract during the procedure and reduce the bloating. If you had a lower endoscopy (such as a colonoscopy or flexible sigmoidoscopy) you may notice spotting of blood in your stool or on the toilet paper. Some abdominal soreness may be present for a day or two, also.  DIET: Your first meal following the procedure should be a light meal and then it is ok to progress to your normal diet. A half-sandwich or bowl of soup is an example of a good first meal. Heavy or fried foods are harder to digest and may make you feel nauseous or bloated. Drink plenty of fluids but you should avoid alcoholic beverages for 24 hours. If you had a esophageal dilation, please see attached instructions for diet.    ACTIVITY: Your care partner should take you home directly after the procedure. You should plan to take it easy, moving slowly for the rest of the day. You can resume normal activity the day after the procedure however YOU SHOULD NOT DRIVE, use power tools, machinery or perform tasks that involve climbing or major physical exertion for 24 hours (because of the sedation medicines used during the test).   SYMPTOMS TO REPORT IMMEDIATELY: A gastroenterologist can be reached at any hour. Please call [phone]  for any of the following symptoms:  Following upper endoscopy (EGD, EUS, ERCP, esophageal dilation) Vomiting of blood or coffee ground material  New, significant abdominal pain  New, significant  chest pain or pain under the shoulder blades  Painful or persistently difficult swallowing  New shortness of breath  Black, tarry-looking or red, bloody stools  FOLLOW UP:  If any biopsies were taken you will be contacted by phone or by letter within the next 1-3 weeks. Call [phone]  if you have not heard about the biopsies in 3 weeks.  Please also call with any specific questions about appointments or follow up tests.

## 2023-05-23 NOTE — Progress Notes (Signed)
Pt's states no medical or surgical changes since previsit or office visit. 

## 2023-05-23 NOTE — Progress Notes (Signed)
Called to room to assist during endoscopic procedure.  Patient ID and intended procedure confirmed with present staff. Received instructions for my participation in the procedure from the performing physician.  

## 2023-05-23 NOTE — Progress Notes (Signed)
1035 Robinul 0.1 mg IV given due large amount of secretions upon assessment.  MD made aware, vss 

## 2023-05-23 NOTE — Op Note (Signed)
[facility] Patient Name: Kelli Martinez Procedure Date: 05/23/2023 10:31 AM MRN: [id number] Endoscopist: Madelyn Brunner [place] , , [id number] Age: 77 Referring MD:  Date of Birth: 31-Mar-1946 Gender: Female Account #: [id number] Procedure:                Upper GI endoscopy Indications:              Follow-up of gastric polyps Medicines:                Monitored Anesthesia Care Procedure:                Pre-Anesthesia Assessment:                           - Prior to the procedure, a History and Physical                            was performed, and patient medications and                            allergies were reviewed. The patient's tolerance of                            previous anesthesia was also reviewed. The risks                            and benefits of the procedure and the sedation                            options and risks were discussed with the patient.                            All questions were answered, and informed consent                            was obtained. Prior Anticoagulants: The patient has                            taken Lovenox (enoxaparin), last dose was 2 days                            prior to procedure. ASA Grade Assessment: II - A                            patient with mild systemic disease. After reviewing                            the risks and benefits, the patient was deemed in                            satisfactory condition to undergo the procedure.                           After obtaining informed consent, the endoscope was  passed under direct vision. Throughout the                            procedure, the patient's blood pressure, pulse, and                            oxygen saturations were monitored continuously. The                            GIF [id number] #[id number] was introduced through the                            mouth, and advanced to the second part of duodenum.                             The upper GI endoscopy was accomplished without                            difficulty. The patient tolerated the procedure                            well. Scope In: Scope Out: Findings:                 White nummular lesions were noted in the distal                            esophagus. Biopsies were taken with a cold forceps                            for histology.                           A single 5 mm sessile polyp with no bleeding and no                            stigmata of recent bleeding was found in the                            gastric body. The polyp was removed with a cold                            snare. Resection and retrieval were complete.                           Evidence of a Roux-en-Y gastrojejunostomy was                            found. The gastrojejunal anastomosis was                            characterized by healthy appearing mucosa. This was                            traversed.  The examined jejunum was normal. Complications:            No immediate complications. Estimated Blood Loss:     Estimated blood loss was minimal. Impression:               - White nummular lesions in esophageal mucosa.                            Biopsied.                           - A single gastric polyp. Resected and retrieved.                           - Roux-en-Y gastrojejunostomy with gastrojejunal                            anastomosis characterized by healthy appearing                            mucosa.                           - Normal examined jejunum. Recommendation:           - Discharge patient to home (with escort).                           - Await pathology results.                           - Please restart your warfarin and Lovenox as                            instructed by your cardiologist.                           - Return to GI clinic in 4 months for follow up of                            your PBC.                            - The findings and recommendations were discussed                            with the patient. Dr Particia Lather "[place]" Leonides Schanz,  05/23/2023 10:51:43 AM

## 2023-05-24 ENCOUNTER — Telehealth: Payer: Self-pay

## 2023-05-24 DIAGNOSIS — I890 Lymphedema, not elsewhere classified: Secondary | ICD-10-CM | POA: Diagnosis not present

## 2023-05-24 DIAGNOSIS — D649 Anemia, unspecified: Secondary | ICD-10-CM | POA: Diagnosis not present

## 2023-05-24 DIAGNOSIS — E1142 Type 2 diabetes mellitus with diabetic polyneuropathy: Secondary | ICD-10-CM | POA: Diagnosis not present

## 2023-05-24 DIAGNOSIS — I872 Venous insufficiency (chronic) (peripheral): Secondary | ICD-10-CM | POA: Diagnosis not present

## 2023-05-24 DIAGNOSIS — E1151 Type 2 diabetes mellitus with diabetic peripheral angiopathy without gangrene: Secondary | ICD-10-CM | POA: Diagnosis not present

## 2023-05-24 NOTE — Telephone Encounter (Signed)
Encounter created in error

## 2023-05-24 NOTE — Telephone Encounter (Signed)
Left voicemail for follow up call.

## 2023-05-25 ENCOUNTER — Encounter: Payer: Self-pay | Admitting: Internal Medicine

## 2023-05-25 ENCOUNTER — Telehealth: Payer: Self-pay | Admitting: Family Medicine

## 2023-05-25 DIAGNOSIS — D649 Anemia, unspecified: Secondary | ICD-10-CM | POA: Diagnosis not present

## 2023-05-25 DIAGNOSIS — I872 Venous insufficiency (chronic) (peripheral): Secondary | ICD-10-CM | POA: Diagnosis not present

## 2023-05-25 DIAGNOSIS — I890 Lymphedema, not elsewhere classified: Secondary | ICD-10-CM | POA: Diagnosis not present

## 2023-05-25 DIAGNOSIS — E1142 Type 2 diabetes mellitus with diabetic polyneuropathy: Secondary | ICD-10-CM | POA: Diagnosis not present

## 2023-05-25 DIAGNOSIS — E1151 Type 2 diabetes mellitus with diabetic peripheral angiopathy without gangrene: Secondary | ICD-10-CM | POA: Diagnosis not present

## 2023-05-25 LAB — SURGICAL PATHOLOGY

## 2023-05-25 NOTE — Progress Notes (Signed)
Hi Kelli Martinez, please let the patient know that biopsies from her esophagus showed that she has a yeast infection in her esophagus called candida. She will need to be treated with fluconazole 200 mg for 1 dose, followed by 100 mg daily for 13 days. Please send this treatment regimen to her. Her stomach polyp was benign.

## 2023-05-25 NOTE — Telephone Encounter (Signed)
Okay 

## 2023-05-25 NOTE — Telephone Encounter (Signed)
Kelli Martinez PT [facility] is calling and would like VO for PT 1X9

## 2023-05-27 DIAGNOSIS — E1151 Type 2 diabetes mellitus with diabetic peripheral angiopathy without gangrene: Secondary | ICD-10-CM | POA: Diagnosis not present

## 2023-05-27 DIAGNOSIS — D649 Anemia, unspecified: Secondary | ICD-10-CM | POA: Diagnosis not present

## 2023-05-27 DIAGNOSIS — E1142 Type 2 diabetes mellitus with diabetic polyneuropathy: Secondary | ICD-10-CM | POA: Diagnosis not present

## 2023-05-27 DIAGNOSIS — I890 Lymphedema, not elsewhere classified: Secondary | ICD-10-CM | POA: Diagnosis not present

## 2023-05-27 DIAGNOSIS — I872 Venous insufficiency (chronic) (peripheral): Secondary | ICD-10-CM | POA: Diagnosis not present

## 2023-05-28 ENCOUNTER — Telehealth: Payer: Self-pay | Admitting: Family Medicine

## 2023-05-28 NOTE — Telephone Encounter (Signed)
Kelli Martinez, OT with [facility] [phone] *Ok to leave a detailed message on this line.  Verbal Order - Occupation Therapy:  1w4 1 x every other wk for 4 wks

## 2023-05-28 NOTE — Telephone Encounter (Signed)
Called sheila from [facility], left a detailed VM

## 2023-05-28 NOTE — Telephone Encounter (Signed)
Called left a VM with VO

## 2023-05-28 NOTE — Telephone Encounter (Signed)
Ok

## 2023-05-29 ENCOUNTER — Ambulatory Visit (INDEPENDENT_AMBULATORY_CARE_PROVIDER_SITE_OTHER): Payer: Medicare Other

## 2023-05-29 ENCOUNTER — Other Ambulatory Visit: Payer: Self-pay

## 2023-05-29 DIAGNOSIS — Z7984 Long term (current) use of oral hypoglycemic drugs: Secondary | ICD-10-CM | POA: Diagnosis not present

## 2023-05-29 DIAGNOSIS — D649 Anemia, unspecified: Secondary | ICD-10-CM | POA: Diagnosis not present

## 2023-05-29 DIAGNOSIS — E039 Hypothyroidism, unspecified: Secondary | ICD-10-CM | POA: Diagnosis not present

## 2023-05-29 DIAGNOSIS — H5461 Unqualified visual loss, right eye, normal vision left eye: Secondary | ICD-10-CM | POA: Diagnosis not present

## 2023-05-29 DIAGNOSIS — M25512 Pain in left shoulder: Secondary | ICD-10-CM | POA: Diagnosis not present

## 2023-05-29 DIAGNOSIS — Z87891 Personal history of nicotine dependence: Secondary | ICD-10-CM | POA: Diagnosis not present

## 2023-05-29 DIAGNOSIS — M545 Low back pain, unspecified: Secondary | ICD-10-CM | POA: Diagnosis not present

## 2023-05-29 DIAGNOSIS — Z7901 Long term (current) use of anticoagulants: Secondary | ICD-10-CM

## 2023-05-29 DIAGNOSIS — I872 Venous insufficiency (chronic) (peripheral): Secondary | ICD-10-CM | POA: Diagnosis not present

## 2023-05-29 DIAGNOSIS — I890 Lymphedema, not elsewhere classified: Secondary | ICD-10-CM | POA: Diagnosis not present

## 2023-05-29 DIAGNOSIS — H409 Unspecified glaucoma: Secondary | ICD-10-CM | POA: Diagnosis not present

## 2023-05-29 DIAGNOSIS — E1151 Type 2 diabetes mellitus with diabetic peripheral angiopathy without gangrene: Secondary | ICD-10-CM | POA: Diagnosis not present

## 2023-05-29 DIAGNOSIS — Z853 Personal history of malignant neoplasm of breast: Secondary | ICD-10-CM | POA: Diagnosis not present

## 2023-05-29 DIAGNOSIS — Z95828 Presence of other vascular implants and grafts: Secondary | ICD-10-CM | POA: Diagnosis not present

## 2023-05-29 DIAGNOSIS — M810 Age-related osteoporosis without current pathological fracture: Secondary | ICD-10-CM | POA: Diagnosis not present

## 2023-05-29 DIAGNOSIS — G8929 Other chronic pain: Secondary | ICD-10-CM | POA: Diagnosis not present

## 2023-05-29 DIAGNOSIS — I10 Essential (primary) hypertension: Secondary | ICD-10-CM | POA: Diagnosis not present

## 2023-05-29 DIAGNOSIS — J45909 Unspecified asthma, uncomplicated: Secondary | ICD-10-CM | POA: Diagnosis not present

## 2023-05-29 DIAGNOSIS — Z9011 Acquired absence of right breast and nipple: Secondary | ICD-10-CM | POA: Diagnosis not present

## 2023-05-29 DIAGNOSIS — Z6837 Body mass index (BMI) 37.0-37.9, adult: Secondary | ICD-10-CM | POA: Diagnosis not present

## 2023-05-29 DIAGNOSIS — Z79891 Long term (current) use of opiate analgesic: Secondary | ICD-10-CM | POA: Diagnosis not present

## 2023-05-29 DIAGNOSIS — E1142 Type 2 diabetes mellitus with diabetic polyneuropathy: Secondary | ICD-10-CM | POA: Diagnosis not present

## 2023-05-29 LAB — POCT INR: INR: 1.7 — AB (ref 2.0–3.0)

## 2023-05-29 MED ORDER — FLUCONAZOLE 100 MG PO TABS: ORAL_TABLET | ORAL | 0 refills | Status: DC

## 2023-05-29 NOTE — Progress Notes (Addendum)
Pt had recent surgery and procedures and has been on a lovenox bridge. Pt will start fluconazole per GI for yeast infection in throat. This has a major interaction with warfarin.  A reduction in warfarin dosing is made to account for the major interaction of fluconazole. Pt will return in one week.  Stop Lovenox injections. Take 2 tablets today and then change weekly dose to take 1 1/2 tablets except take 1 tablet on Sundays. Recheck in 1 week.

## 2023-05-29 NOTE — Patient Instructions (Addendum)
Pre visit review using our clinic review tool, if applicable. No additional management support is needed unless otherwise documented below in the visit note.  Take 2 tablets today and then change weekly dose to take 1 1/2 tablets except take 1 tablet on Sundays. Recheck in 1 week.

## 2023-05-30 DIAGNOSIS — I890 Lymphedema, not elsewhere classified: Secondary | ICD-10-CM | POA: Diagnosis not present

## 2023-05-30 DIAGNOSIS — D649 Anemia, unspecified: Secondary | ICD-10-CM | POA: Diagnosis not present

## 2023-05-30 DIAGNOSIS — E1142 Type 2 diabetes mellitus with diabetic polyneuropathy: Secondary | ICD-10-CM | POA: Diagnosis not present

## 2023-05-30 DIAGNOSIS — I872 Venous insufficiency (chronic) (peripheral): Secondary | ICD-10-CM | POA: Diagnosis not present

## 2023-05-30 DIAGNOSIS — E1151 Type 2 diabetes mellitus with diabetic peripheral angiopathy without gangrene: Secondary | ICD-10-CM | POA: Diagnosis not present

## 2023-06-05 ENCOUNTER — Ambulatory Visit (INDEPENDENT_AMBULATORY_CARE_PROVIDER_SITE_OTHER): Payer: Medicare Other

## 2023-06-05 DIAGNOSIS — Z7901 Long term (current) use of anticoagulants: Secondary | ICD-10-CM | POA: Diagnosis not present

## 2023-06-05 LAB — POCT INR: INR: 3.6 — AB (ref 2.0–3.0)

## 2023-06-05 NOTE — Patient Instructions (Addendum)
Pre visit review using our clinic review tool, if applicable. No additional management support is needed unless otherwise documented below in the visit note.  Hold dose tomorrow and then change weekly dose to take 1 1/2 tablets daily except take 1 tablet on Sunday, Tuesday and Thursday. Recheck in 1 week.

## 2023-06-05 NOTE — Progress Notes (Addendum)
Pt started fluconazole on 10/30 for yeast infection in throat. She will take one daily for 2 weeks. Pt already took warfarin today. Hold dose tomorrow and then change weekly dose to take 1 1/2 tablets daily except take 1 tablet on Sunday, Tuesday and Thursday. Recheck in 1 week.   Pt denies any bleeding or abnormal bruising. Advised if any s/s to go to ER. Pt and her daughter verbalized understanding.

## 2023-06-06 DIAGNOSIS — D649 Anemia, unspecified: Secondary | ICD-10-CM | POA: Diagnosis not present

## 2023-06-06 DIAGNOSIS — I872 Venous insufficiency (chronic) (peripheral): Secondary | ICD-10-CM | POA: Diagnosis not present

## 2023-06-06 DIAGNOSIS — I890 Lymphedema, not elsewhere classified: Secondary | ICD-10-CM | POA: Diagnosis not present

## 2023-06-06 DIAGNOSIS — E1142 Type 2 diabetes mellitus with diabetic polyneuropathy: Secondary | ICD-10-CM | POA: Diagnosis not present

## 2023-06-06 DIAGNOSIS — E1151 Type 2 diabetes mellitus with diabetic peripheral angiopathy without gangrene: Secondary | ICD-10-CM | POA: Diagnosis not present

## 2023-06-08 DIAGNOSIS — D649 Anemia, unspecified: Secondary | ICD-10-CM | POA: Diagnosis not present

## 2023-06-08 DIAGNOSIS — E1151 Type 2 diabetes mellitus with diabetic peripheral angiopathy without gangrene: Secondary | ICD-10-CM | POA: Diagnosis not present

## 2023-06-08 DIAGNOSIS — I872 Venous insufficiency (chronic) (peripheral): Secondary | ICD-10-CM | POA: Diagnosis not present

## 2023-06-08 DIAGNOSIS — I890 Lymphedema, not elsewhere classified: Secondary | ICD-10-CM | POA: Diagnosis not present

## 2023-06-08 DIAGNOSIS — E1142 Type 2 diabetes mellitus with diabetic polyneuropathy: Secondary | ICD-10-CM | POA: Diagnosis not present

## 2023-06-12 ENCOUNTER — Ambulatory Visit (INDEPENDENT_AMBULATORY_CARE_PROVIDER_SITE_OTHER): Payer: Medicare Other

## 2023-06-12 DIAGNOSIS — Z7901 Long term (current) use of anticoagulants: Secondary | ICD-10-CM

## 2023-06-12 LAB — POCT INR: INR: 5.5 — AB (ref 2.0–3.0)

## 2023-06-12 NOTE — Patient Instructions (Addendum)
Pre visit review using our clinic review tool, if applicable. No additional management support is needed unless otherwise documented below in the visit note.  Hold dose for the next two days and then change weekly dose to take 1 tablets daily. Recheck in 1 week.

## 2023-06-12 NOTE — Progress Notes (Signed)
Pt started fluconazole on 10/30 for yeast infection in throat. She took her last dose last night. Pt already took warfarin today. Hold dose for the next two days and then change weekly dose to take 1 tablets daily. Recheck in 1 week.  Pt denies any bleeding or abnormal bruising. Advised if any s/s to go to ER. Pt and her daughter verbalized understanding.

## 2023-06-13 ENCOUNTER — Telehealth: Payer: Self-pay | Admitting: Family Medicine

## 2023-06-13 DIAGNOSIS — E1142 Type 2 diabetes mellitus with diabetic polyneuropathy: Secondary | ICD-10-CM | POA: Diagnosis not present

## 2023-06-13 DIAGNOSIS — D649 Anemia, unspecified: Secondary | ICD-10-CM | POA: Diagnosis not present

## 2023-06-13 DIAGNOSIS — E1151 Type 2 diabetes mellitus with diabetic peripheral angiopathy without gangrene: Secondary | ICD-10-CM | POA: Diagnosis not present

## 2023-06-13 DIAGNOSIS — I872 Venous insufficiency (chronic) (peripheral): Secondary | ICD-10-CM | POA: Diagnosis not present

## 2023-06-13 DIAGNOSIS — I890 Lymphedema, not elsewhere classified: Secondary | ICD-10-CM | POA: Diagnosis not present

## 2023-06-13 NOTE — Telephone Encounter (Signed)
Connye Burkitt, Nurse Clinical Mgr with [facility] Called to say they have been waiting for VO for Nursing since 04/04/23. *Fax to follow.  As per Connye Burkitt, VO effective dated must be from 04/04/23

## 2023-06-14 DIAGNOSIS — E1151 Type 2 diabetes mellitus with diabetic peripheral angiopathy without gangrene: Secondary | ICD-10-CM | POA: Diagnosis not present

## 2023-06-14 DIAGNOSIS — D649 Anemia, unspecified: Secondary | ICD-10-CM | POA: Diagnosis not present

## 2023-06-14 DIAGNOSIS — I890 Lymphedema, not elsewhere classified: Secondary | ICD-10-CM | POA: Diagnosis not present

## 2023-06-14 DIAGNOSIS — I872 Venous insufficiency (chronic) (peripheral): Secondary | ICD-10-CM | POA: Diagnosis not present

## 2023-06-14 DIAGNOSIS — E1142 Type 2 diabetes mellitus with diabetic polyneuropathy: Secondary | ICD-10-CM | POA: Diagnosis not present

## 2023-06-19 DIAGNOSIS — M51362 Other intervertebral disc degeneration, lumbar region with discogenic back pain and lower extremity pain: Secondary | ICD-10-CM | POA: Diagnosis not present

## 2023-06-19 DIAGNOSIS — M545 Low back pain, unspecified: Secondary | ICD-10-CM | POA: Diagnosis not present

## 2023-06-19 DIAGNOSIS — M5030 Other cervical disc degeneration, unspecified cervical region: Secondary | ICD-10-CM | POA: Diagnosis not present

## 2023-06-19 DIAGNOSIS — M5416 Radiculopathy, lumbar region: Secondary | ICD-10-CM | POA: Diagnosis not present

## 2023-06-20 DIAGNOSIS — I890 Lymphedema, not elsewhere classified: Secondary | ICD-10-CM | POA: Diagnosis not present

## 2023-06-20 DIAGNOSIS — E1142 Type 2 diabetes mellitus with diabetic polyneuropathy: Secondary | ICD-10-CM | POA: Diagnosis not present

## 2023-06-20 DIAGNOSIS — D649 Anemia, unspecified: Secondary | ICD-10-CM | POA: Diagnosis not present

## 2023-06-20 DIAGNOSIS — E1151 Type 2 diabetes mellitus with diabetic peripheral angiopathy without gangrene: Secondary | ICD-10-CM | POA: Diagnosis not present

## 2023-06-20 DIAGNOSIS — I872 Venous insufficiency (chronic) (peripheral): Secondary | ICD-10-CM | POA: Diagnosis not present

## 2023-06-21 DIAGNOSIS — E1142 Type 2 diabetes mellitus with diabetic polyneuropathy: Secondary | ICD-10-CM | POA: Diagnosis not present

## 2023-06-21 DIAGNOSIS — E1151 Type 2 diabetes mellitus with diabetic peripheral angiopathy without gangrene: Secondary | ICD-10-CM | POA: Diagnosis not present

## 2023-06-21 DIAGNOSIS — I890 Lymphedema, not elsewhere classified: Secondary | ICD-10-CM | POA: Diagnosis not present

## 2023-06-21 DIAGNOSIS — D649 Anemia, unspecified: Secondary | ICD-10-CM | POA: Diagnosis not present

## 2023-06-21 DIAGNOSIS — I872 Venous insufficiency (chronic) (peripheral): Secondary | ICD-10-CM | POA: Diagnosis not present

## 2023-06-22 ENCOUNTER — Ambulatory Visit (INDEPENDENT_AMBULATORY_CARE_PROVIDER_SITE_OTHER): Payer: Medicare Other

## 2023-06-22 ENCOUNTER — Telehealth: Payer: Self-pay | Admitting: Family Medicine

## 2023-06-22 DIAGNOSIS — Z7901 Long term (current) use of anticoagulants: Secondary | ICD-10-CM | POA: Diagnosis not present

## 2023-06-22 LAB — POCT INR: INR: 1.3 — AB (ref 2.0–3.0)

## 2023-06-22 NOTE — Progress Notes (Signed)
Pt has finished fluconazole and reports that she has also been eating a lot of vitamin K containing foods due to her elevated INR at her last apt. Increase dose today and tomorrow to take 1 1/2 tablets and then change weekly dose to take 1 tablet daily except take 1 1/2 tablets on Monday, Wednesday, and Friday. Recheck in 2 weeks.  Advised pt to watch for s/s of a clot and if any s/s to go to ER. Pt and her daughter verbalized understanding.

## 2023-06-22 NOTE — Patient Instructions (Addendum)
Pre visit review using our clinic review tool, if applicable. No additional management support is needed unless otherwise documented below in the visit note.  Increase dose today and tomorrow to take 1 1/2 tablets and then change weekly dose to take 1 tablet daily except take 1 1/2 tablets on Monday, Wednesday, and Friday. Recheck in 2 weeks.

## 2023-06-22 NOTE — Telephone Encounter (Signed)
Diabetes doctor referred her to [facility]. Says her kidneys are giving her problems. Has an appointment with them Monday at 8:30a

## 2023-06-25 DIAGNOSIS — I129 Hypertensive chronic kidney disease with stage 1 through stage 4 chronic kidney disease, or unspecified chronic kidney disease: Secondary | ICD-10-CM | POA: Diagnosis not present

## 2023-06-25 DIAGNOSIS — N1831 Chronic kidney disease, stage 3a: Secondary | ICD-10-CM | POA: Diagnosis not present

## 2023-06-25 DIAGNOSIS — E1122 Type 2 diabetes mellitus with diabetic chronic kidney disease: Secondary | ICD-10-CM | POA: Diagnosis not present

## 2023-06-26 ENCOUNTER — Other Ambulatory Visit: Payer: Self-pay | Admitting: Family Medicine

## 2023-06-26 DIAGNOSIS — E782 Mixed hyperlipidemia: Secondary | ICD-10-CM

## 2023-06-26 DIAGNOSIS — G894 Chronic pain syndrome: Secondary | ICD-10-CM | POA: Insufficient documentation

## 2023-06-26 LAB — LAB REPORT - SCANNED
Albumin, Urine POC: <3
Creatinine, POC: 19.8 mg/dL
EGFR: 49
Microalb Creat Ratio: <15

## 2023-07-06 ENCOUNTER — Ambulatory Visit: Payer: Medicare Other

## 2023-07-06 ENCOUNTER — Encounter: Payer: Self-pay | Admitting: Family Medicine

## 2023-07-06 ENCOUNTER — Ambulatory Visit: Payer: Medicare Other | Admitting: Family Medicine

## 2023-07-06 VITALS — BP 110/72 | HR 51 | Temp 98.0°F | Ht 68.0 in | Wt 225.2 lb

## 2023-07-06 DIAGNOSIS — K5900 Constipation, unspecified: Secondary | ICD-10-CM | POA: Diagnosis not present

## 2023-07-06 DIAGNOSIS — K743 Primary biliary cirrhosis: Secondary | ICD-10-CM

## 2023-07-06 DIAGNOSIS — R5383 Other fatigue: Secondary | ICD-10-CM | POA: Diagnosis not present

## 2023-07-06 DIAGNOSIS — I739 Peripheral vascular disease, unspecified: Secondary | ICD-10-CM | POA: Diagnosis not present

## 2023-07-06 DIAGNOSIS — N1832 Chronic kidney disease, stage 3b: Secondary | ICD-10-CM

## 2023-07-06 DIAGNOSIS — E1151 Type 2 diabetes mellitus with diabetic peripheral angiopathy without gangrene: Secondary | ICD-10-CM | POA: Diagnosis not present

## 2023-07-06 DIAGNOSIS — E1142 Type 2 diabetes mellitus with diabetic polyneuropathy: Secondary | ICD-10-CM | POA: Diagnosis not present

## 2023-07-06 DIAGNOSIS — R195 Other fecal abnormalities: Secondary | ICD-10-CM

## 2023-07-06 DIAGNOSIS — M25512 Pain in left shoulder: Secondary | ICD-10-CM

## 2023-07-06 DIAGNOSIS — I872 Venous insufficiency (chronic) (peripheral): Secondary | ICD-10-CM

## 2023-07-06 DIAGNOSIS — Z9884 Bariatric surgery status: Secondary | ICD-10-CM | POA: Diagnosis not present

## 2023-07-06 DIAGNOSIS — L603 Nail dystrophy: Secondary | ICD-10-CM | POA: Diagnosis not present

## 2023-07-06 DIAGNOSIS — L84 Corns and callosities: Secondary | ICD-10-CM | POA: Diagnosis not present

## 2023-07-06 DIAGNOSIS — Z7901 Long term (current) use of anticoagulants: Secondary | ICD-10-CM | POA: Diagnosis not present

## 2023-07-06 DIAGNOSIS — G8929 Other chronic pain: Secondary | ICD-10-CM

## 2023-07-06 LAB — CBC WITH DIFFERENTIAL/PLATELET
Basophils Absolute: 0.1 10*3/uL (ref 0.0–0.1)
Basophils Relative: 1.2 % (ref 0.0–3.0)
Eosinophils Absolute: 0 10*3/uL (ref 0.0–0.7)
Eosinophils Relative: 0.7 % (ref 0.0–5.0)
HCT: 36.8 % (ref 36.0–46.0)
Hemoglobin: 12.5 g/dL (ref 12.0–15.0)
Lymphocytes Relative: 28.9 % (ref 12.0–46.0)
Lymphs Abs: 1.5 10*3/uL (ref 0.7–4.0)
MCHC: 34 g/dL (ref 30.0–36.0)
MCV: 88.2 fL (ref 78.0–100.0)
Monocytes Absolute: 0.3 10*3/uL (ref 0.1–1.0)
Monocytes Relative: 4.9 % (ref 3.0–12.0)
Neutro Abs: 3.4 10*3/uL (ref 1.4–7.7)
Neutrophils Relative %: 64.3 % (ref 43.0–77.0)
Platelets: 228 10*3/uL (ref 150.0–400.0)
RBC: 4.17 Mil/uL (ref 3.87–5.11)
RDW: 15.4 % (ref 11.5–15.5)
WBC: 5.3 10*3/uL (ref 4.0–10.5)

## 2023-07-06 LAB — VITAMIN D 25 HYDROXY (VIT D DEFICIENCY, FRACTURES): VITD: 51.3 ng/mL (ref 30.00–100.00)

## 2023-07-06 LAB — COMPREHENSIVE METABOLIC PANEL
ALT: 11 U/L (ref 0–35)
AST: 14 U/L (ref 0–37)
Albumin: 4 g/dL (ref 3.5–5.2)
Alkaline Phosphatase: 103 U/L (ref 39–117)
BUN: 22 mg/dL (ref 6–23)
CO2: 34 meq/L — ABNORMAL HIGH (ref 19–32)
Calcium: 9.7 mg/dL (ref 8.4–10.5)
Chloride: 100 meq/L (ref 96–112)
Creatinine, Ser: 1.08 mg/dL (ref 0.40–1.20)
GFR: 50.23 mL/min — ABNORMAL LOW (ref >60.00)
Glucose, Bld: 118 mg/dL — ABNORMAL HIGH (ref 70–99)
Potassium: 4.9 meq/L (ref 3.5–5.1)
Sodium: 139 meq/L (ref 135–145)
Total Bilirubin: 0.4 mg/dL (ref 0.2–1.2)
Total Protein: 6.9 g/dL (ref 6.0–8.3)

## 2023-07-06 LAB — VITAMIN B12: Vitamin B-12: 748 pg/mL (ref 211–911)

## 2023-07-06 LAB — POCT INR: INR: 1.3 — AB (ref 2.0–3.0)

## 2023-07-06 LAB — TSH: TSH: 2.42 u[IU]/mL (ref 0.35–5.50)

## 2023-07-06 MED ORDER — SENNOSIDES-DOCUSATE SODIUM 8.6-50 MG PO TABS: 1.0000 | ORAL_TABLET | Freq: Every day | ORAL | 4 refills | Status: DC

## 2023-07-06 NOTE — Progress Notes (Signed)
Established Patient Office Visit   Subjective  Patient ID: Kelli Martinez, female    DOB: Sep 19, 1945  Age: 77 y.o. MRN: [id number]  Chief Complaint  Patient presents with   Medical Management of Chronic Issues   Shoulder Pain    Left shoulder hurt when she lifts the arm, rate of pain 8/10    Patient is a 77 year old female seen for follow-up on chronic conditions.  Back and shoulder pain.  Followed by Dr. Horald Chestnut.  Had injections in neck 2 months ago.  Endorses stiffness in left shoulder, decreased range of motion some days.  Pain with reaching overhead.  Sleeping on a heating pad at night.  Taking half of hydrocodone in the morning.  Will take another half tab at night if needed.  Does not like taking medication chronically.  Also using Voltaren gel.  DM2: Followed by endocrinology.  Blood sugar controlled.  Patient notes increased neuropathy in toes at night.  Using over-the-counter cream.  CKD: Sent to nephrology by endocrinology.  Nephrology felt things were stable for now.  Advised to follow-up in 1 year.  Constipation: Patient tried taking MiraLAX because stools do appear dark/black in color.  Smooth move cause stools to appear green in color.  Patient endorses bloating, incomplete emptying of bowels, feeling tired, and weak.  Milk of Magnesia helped decrease symptoms.  Patient inquires about something else she can take to help with symptoms.  Patient states last CBC did not show anemia.    Patient Active Problem List   Diagnosis Date Noted   Chronic pain syndrome 06/26/2023   Cervical radiculopathy 04/11/2023   Syncope 01/04/2023   Obesity (BMI 30-39.9) 01/04/2023   History of recurrent venous thromboembolism 01/04/2023   Type 2 diabetes mellitus with diabetic polyneuropathy, without long-term current use of insulin (HCC) 01/04/2023   Hypoglycemia following gastrointestinal surgery 03/19/2018   Edema of both lower extremities due to peripheral venous insufficiency  11/20/2017   Maxillary sinusitis, acute 08/31/2017   Encounter for therapeutic drug monitoring 08/26/2013   Insomnia 05/01/2013   Long term (current) use of anticoagulants 04/15/2013   Postsurgical dumping syndrome 04/06/2013   Bilateral leg edema 04/06/2013   Reactive hypoglycemia 04/02/2013   Abdominal pain intermittent-evalulating for internal hernia 11/07/2012   S/P laparoscopic cholecystectomy 05/31/2012   Roux Y Gastric Bypass April 2011; repair [facility] 11/22/2011   Iron deficiency anemia 11/14/2011   Hypertension    Left leg paresthesias 10/27/2010   Hypothyroidism 10/27/2010   Low back pain 10/27/2010   Obstructive sleep apnea 09/29/2007   PULMONARY EMBOLISM 09/25/2007   Seasonal and perennial allergic rhinitis 09/25/2007   Allergic asthma, mild persistent, uncomplicated 09/25/2007   BREAST CANCER, HX OF 09/25/2007   Past Medical History:  Diagnosis Date   Allergy    Anemia    Arthritis    Asthma    Back pain, chronic    GETS INJECTIONS IN BACK   Blind right eye    hemorrhage   Blood transfusion    Cancer (HCC)    breast 1994   Clotting disorder (HCC)    Diabetes mellitus without complication (HCC)    takes precose   Elevated liver enzymes    Gallstones    GERD (gastroesophageal reflux disease)    HX: breast cancer    Hyperlipidemia    Hypertension    Hypothyroid    Lymphedema of arm    RT   OSA (obstructive sleep apnea)    has not used in 2 years-lost weight  Osteoporosis    Peripheral neuropathy    on gabapentin   PONV (postoperative nausea and vomiting)    Psoriasis    Pulmonary embolism (HCC) 1998 / 1994 /1968   Syncope and collapse 2011   due to anemia   Past Surgical History:  Procedure Laterality Date   ABDOMINAL HYSTERECTOMY     BONE MARROW TRANSPLANT  1994   CARDIOVASCULAR STRESS TEST  05/23/2005   EF 53%   carpal tunnell     bil   cataracts     CHOLECYSTECTOMY  05/10/2012   Procedure: LAPAROSCOPIC CHOLECYSTECTOMY WITH INTRAOPERATIVE  CHOLANGIOGRAM;  Surgeon: Valarie Merino, MD;  Location: [facility];  Service: General;  Laterality: N/A;  Laparoscopic Cholecystectomy with Intraoperative Cholangiogram   GASTRIC BYPASS  2011   bariatric surgery   HIATAL HERNIA REPAIR     IVC filter     recurrent DVT   KNEE ARTHROTOMY  1998   MASS EXCISION Right 11/24/2016   Procedure: EXCISION RIGHT AXILLARY LESION AND CHEST WALL LESION;  Surgeon: Luretha Murphy, MD;  Location: [facility];  Service: General;  Laterality: Right;   MASTECTOMY     Right   US ECHOCARDIOGRAPHY  10/27/08   EF 55-60%   Social History   Tobacco Use   Smoking status: Former    Current packs/day: 0.00    Average packs/day: 0.5 packs/day for 10.0 years (5.0 ttl pk-yrs)    Types: Cigarettes    Start date: 07/31/1968    Quit date: 07/31/1978    Years since quitting: 44.9   Smokeless tobacco: Never  Vaping Use   Vaping status: Never Used  Substance Use Topics   Alcohol use: No   Drug use: No   Family History  Problem Relation Age of Onset   Sudden death Mother        car accident   Cancer Father 40       lung   Cancer Sister 41       lung cancer   Asthma Daughter    Colon cancer Neg Hx    Stomach cancer Neg Hx    Esophageal cancer Neg Hx    Colon polyps Neg Hx    Allergies  Allergen Reactions   Adhesive [Tape] Rash      ROS Negative unless stated above    Objective:     BP 110/72 (BP Location: Right Arm, Patient Position: Sitting, Cuff Size: Large)   Pulse (!) 51   Temp 98 F (36.7 C) (Oral)   Ht 5\' 8"  (1.727 m)   Wt 225 lb 3.2 oz (102.2 kg)   SpO2 97%   BMI 34.24 kg/m    Physical Exam Constitutional:      General: She is not in acute distress.    Appearance: Normal appearance.  HENT:     Head: Normocephalic and atraumatic.     Nose: Nose normal.     Mouth/Throat:     Mouth: Mucous membranes are moist.  Cardiovascular:     Rate and Rhythm: Normal rate and regular rhythm.     Heart sounds: Normal heart sounds.  No murmur heard.    No gallop.  Pulmonary:     Effort: Pulmonary effort is normal. No respiratory distress.     Breath sounds: Normal breath sounds. No wheezing, rhonchi or rales.  Musculoskeletal:     Right lower leg: Edema present.     Left lower leg: Edema present.  Skin:    General: Skin is warm  and dry.     Comments: Chronic venous stasis changes of bilateral LEs.  Neurological:     Mental Status: She is alert and oriented to person, place, and time.       Assessment & Plan:  Chronic left shoulder pain -Discussed possible causes including OA. -continue current care including topical voltaren gel, heat, hydrocodone sparingly.  -Continue follow-up with Ortho  Chronic kidney disease, stage 3b (HCC) -Monitor -     Comprehensive metabolic panel  Constipation, unspecified constipation type -Continue follow-up with GI. -Advised hydrocodone may also worsen symptoms -     Sennosides-Docusate Sodium; Take 1 tablet by mouth daily.  Dispense: 30 tablet; Refill: 4 -     Comprehensive metabolic panel -     TSH  Primary biliary cholangitis (HCC) -Continue follow-up with GI -     CBC with Differential/Platelet -     Comprehensive metabolic panel  Type 2 diabetes mellitus with diabetic polyneuropathy, without long-term current use of insulin (HCC) -Hemoglobin A1c 7.1% on 04/05/2023 -Controlled status post gastric bypass -Continue macrogols 50 mg 3 times daily -Continue follow-up with endocrinology  Fatigue, unspecified type -     CBC with Differential/Platelet -     Iron, TIBC and Ferritin Panel -     Vitamin B12 -     Comprehensive metabolic panel -     VITAMIN D 25 Hydroxy (Vit-D Deficiency, Fractures) -     TSH  History of Roux-en-Y gastric bypass -     CBC with Differential/Platelet -     Iron, TIBC and Ferritin Panel -     Vitamin B12 -     Comprehensive metabolic panel -     VITAMIN D 25 Hydroxy (Vit-D Deficiency, Fractures) -     TSH  Dark stools -     CBC with  Differential/Platelet -     Iron, TIBC and Ferritin Panel -     TSH  Chronic venous stasis dermatitis of both lower extremities -Supportive care  Return in about 3 months (around 10/04/2023), or if symptoms worsen or fail to improve.   Deeann Saint, MD

## 2023-07-06 NOTE — Progress Notes (Signed)
Increase dose today and tomorrow to take 1 1/2 tablets and then change weekly dose to take 1 tablet daily except take 1 1/2 tablets daily except take 1 tablet on Saturdays. Recheck in 2 weeks.

## 2023-07-06 NOTE — Patient Instructions (Addendum)
Pre visit review using our clinic review tool, if applicable. No additional management support is needed unless otherwise documented below in the visit note.  Increase dose today and tomorrow to take 1 1/2 tablets and then change weekly dose to take 1 tablet daily except take 1 1/2 tablets daily except take 1 tablet on Saturdays. Recheck in 2 weeks.

## 2023-07-07 LAB — IRON,TIBC AND FERRITIN PANEL
%SAT: 22 % (ref 16–45)
Ferritin: 488 ng/mL — ABNORMAL HIGH (ref 16–288)
Iron: 89 ug/dL (ref 45–160)
TIBC: 406 ug/dL (ref 250–450)

## 2023-07-10 ENCOUNTER — Encounter: Payer: Self-pay | Admitting: Endocrinology

## 2023-07-10 ENCOUNTER — Ambulatory Visit (INDEPENDENT_AMBULATORY_CARE_PROVIDER_SITE_OTHER): Payer: Medicare Other | Admitting: Endocrinology

## 2023-07-10 VITALS — BP 126/80 | HR 55 | Resp 16 | Ht 68.0 in | Wt 221.4 lb

## 2023-07-10 DIAGNOSIS — N1832 Chronic kidney disease, stage 3b: Secondary | ICD-10-CM

## 2023-07-10 DIAGNOSIS — E063 Autoimmune thyroiditis: Secondary | ICD-10-CM | POA: Diagnosis not present

## 2023-07-10 DIAGNOSIS — Z9889 Other specified postprocedural states: Secondary | ICD-10-CM | POA: Diagnosis not present

## 2023-07-10 DIAGNOSIS — Z7984 Long term (current) use of oral hypoglycemic drugs: Secondary | ICD-10-CM | POA: Diagnosis not present

## 2023-07-10 DIAGNOSIS — E1142 Type 2 diabetes mellitus with diabetic polyneuropathy: Secondary | ICD-10-CM | POA: Diagnosis not present

## 2023-07-10 DIAGNOSIS — K912 Postsurgical malabsorption, not elsewhere classified: Secondary | ICD-10-CM

## 2023-07-10 LAB — POCT GLYCOSYLATED HEMOGLOBIN (HGB A1C): Hemoglobin A1C: 6.9 % — AB (ref 4.0–5.6)

## 2023-07-10 NOTE — Progress Notes (Addendum)
Outpatient Endocrinology Note [place] Tationna Fullard, MD  07/10/23  Patient's Name: Kelli Martinez    DOB: 10-05-1945    MRN: [id number]                                                    REASON OF VISIT: Follow up for type 2 diabetes mellitus / hypoglycemia  PCP: Deeann Saint, MD  HISTORY OF PRESENT ILLNESS:   Zari Steffensmeier is a 77 y.o. old female with past medical history listed below, is here for follow up of type 2 diabetes mellitus and hypoglycemia / hypothyroidism.   Pertinent Diabetes History: Patient was diagnosed with type 2 diabetes mellitus several years ago, has mild and well-controlled since her gastric bypass surgery in 2011.  She has postprandial hypoglycemia related with dumping syndrome.  Chronic Diabetes Complications : Retinopathy: no. Last ophthalmology exam was done on annually reportedly. Nephropathy: CKD IIIb, was referred to nephrology. Peripheral neuropathy: yes, on burning, numbness, on gabapentin and nortriptyline, following with podiatry. Coronary artery disease: no Stroke: no  Relevant comorbidities and cardiovascular risk factors: Obesity: yes Body mass index is 33.66 kg/m.  Hypertension: yes Hyperlipidemia. Yes, on statin.   Current / Home Diabetic regimen includes: Acarbose 50 mg 3 times a day with meals.  Prior diabetic medications: Metformin stopped due to tendency of hypoglycemia.  Glycemic data:   Shalaine's glucose meter is Location manager. Raw data and trends analyzed.  Libre view glucometer.  From October 12 to July 10, 2019 for 60 days. -  She has been testing her blood glucoses 1 times daily.  She has been checking at different times of the day in the morning, in the afternoon and in the evening. -  Average glucose for the last 60 days is 119 mg/dl, range 73 - 956.  No hypoglycemia.  Hypoglycemia: Patient has no hypoglycemic episodes. Patient has hypoglycemia awareness.  Factors modifying glucose  control: 1.  Diabetic diet assessment: 3 meals a day.  2.  Staying active or exercising: not able to do exercise.  3.  Medication compliance: compliant all of the time.  # Postprandial hypoglycemia related to dumping syndrome. She had gastric bypass surgery in 2011. -She uses glucose tablet or juice to correct hypoglycemia and hypoglycemic symptoms.  Usually 1 to 2 tablets are educated. -She had seen dietitian instructed for mixed meal diet with low-fat meals enough protein consistently and restricting carbohydrates including fluids. -She did try verapamil previously without benefit. -She is on acarbose for hypoglycemia as well.  # Primary hypothyroidism for several years. -On Thyroid hormone replacement/levothyroxine 112 mcg daily.    Interval history  Glucometer data as reviewed above.  No hypoglycemia. Hemoglobin A1c 6.9% today.  Has remained stable.  Denies hypoglycemic symptoms.  Recent lab results reviewed.  Normal TSH.  She has been taking levothyroxine 112 mcg daily.  She has also been taking iron supplement in the morning.  Advised to take iron supplement at bedtime and continue to take levothyroxine in the morning.   REVIEW OF SYSTEMS As per history of present illness.   PAST MEDICAL HISTORY: Past Medical History:  Diagnosis Date   Allergy    Anemia    Arthritis    Asthma    Back pain, chronic    GETS INJECTIONS IN BACK   Blind right eye  hemorrhage   Blood transfusion    Cancer [facility])    breast 1994   Clotting disorder (HCC)    Diabetes mellitus without complication (HCC)    takes precose   Elevated liver enzymes    Gallstones    GERD (gastroesophageal reflux disease)    HX: breast cancer    Hyperlipidemia    Hypertension    Hypothyroid    Lymphedema of arm    RT   OSA (obstructive sleep apnea)    has not used in 2 years-lost weight   Osteoporosis    Peripheral neuropathy    on gabapentin   PONV (postoperative nausea and vomiting)    Psoriasis     Pulmonary embolism (HCC) 1998 / 1994 /1968   Syncope and collapse 2011   due to anemia    PAST SURGICAL HISTORY: Past Surgical History:  Procedure Laterality Date   ABDOMINAL HYSTERECTOMY     BONE MARROW TRANSPLANT  1994   CARDIOVASCULAR STRESS TEST  05/23/2005   EF 53%   carpal tunnell     bil   cataracts     CHOLECYSTECTOMY  05/10/2012   Procedure: LAPAROSCOPIC CHOLECYSTECTOMY WITH INTRAOPERATIVE CHOLANGIOGRAM;  Surgeon: Valarie Merino, MD;  Location: [facility];  Service: General;  Laterality: N/A;  Laparoscopic Cholecystectomy with Intraoperative Cholangiogram   GASTRIC BYPASS  2011   bariatric surgery   HIATAL HERNIA REPAIR     IVC filter     recurrent DVT   KNEE ARTHROTOMY  1998   MASS EXCISION Right 11/24/2016   Procedure: EXCISION RIGHT AXILLARY LESION AND CHEST WALL LESION;  Surgeon: Luretha Murphy, MD;  Location: [facility];  Service: General;  Laterality: Right;   MASTECTOMY     Right   US ECHOCARDIOGRAPHY  10/27/08   EF 55-60%    ALLERGIES: Allergies  Allergen Reactions   Adhesive [Tape] Rash    FAMILY HISTORY:  Family History  Problem Relation Age of Onset   Sudden death Mother        car accident   Cancer Father 1       lung   Cancer Sister 100       lung cancer   Asthma Daughter    Colon cancer Neg Hx    Stomach cancer Neg Hx    Esophageal cancer Neg Hx    Colon polyps Neg Hx     SOCIAL HISTORY: Social History   Socioeconomic History   Marital status: Widowed    Spouse name: Not on file   Number of children: 2   Years of education: Not on file   Highest education level: Associate degree: occupational, Scientist, product/process development, or vocational program  Occupational History   Occupation: retired  Tobacco Use   Smoking status: Former    Current packs/day: 0.00    Average packs/day: 0.5 packs/day for 10.0 years (5.0 ttl pk-yrs)    Types: Cigarettes    Start date: 07/31/1968    Quit date: 07/31/1978    Years since quitting: 44.9   Smokeless  tobacco: Never  Vaping Use   Vaping status: Never Used  Substance and Sexual Activity   Alcohol use: No   Drug use: No   Sexual activity: Not Currently  Other Topics Concern   Not on file  Social History Narrative   Widowed with 2 children,  Lives in a one story home and her son and his wife recently moved in with her.  Retired Production designer, theatre/television/film for [employer].  Education: some college.  Social Determinants of Health   Financial Resource Strain: Low Risk  (07/03/2023)   Overall Financial Resource Strain (CARDIA)    Difficulty of Paying Living Expenses: Not very hard  Food Insecurity: No Food Insecurity (07/03/2023)   Hunger Vital Sign    Worried About Running Out of Food in the Last Year: Never true    Ran Out of Food in the Last Year: Never true  Transportation Needs: No Transportation Needs (07/03/2023)   PRAPARE - Administrator, Civil Service (Medical): No    Lack of Transportation (Non-Medical): No  Physical Activity: Insufficiently Active (07/03/2023)   Exercise Vital Sign    Days of Exercise per Week: 2 days    Minutes of Exercise per Session: 30 min  Stress: No Stress Concern Present (07/03/2023)   [employer] of Occupational Health - Occupational Stress Questionnaire    Feeling of Stress : Only a little  Social Connections: Unknown (07/03/2023)   Social Connection and Isolation Panel [NHANES]    Frequency of Communication with Friends and Family: Patient declined    Frequency of Social Gatherings with Friends and Family: Once a week    Attends Religious Services: More than 4 times per year    Active Member of [employer] or Organizations: Yes    Attends Banker Meetings: More than 4 times per year    Marital Status: Widowed    MEDICATIONS:  Current Outpatient Medications  Medication Sig Dispense Refill   acarbose (PRECOSE) 50 MG tablet Take 50 mg by mouth 3 (three) times daily.     albuterol (ACCUNEB) 0.63 MG/3ML nebulizer solution      albuterol (PROAIR  HFA) 108 (90 Base) MCG/ACT inhaler Inhale 2 puffs into the lungs every 6 (six) hours as needed for wheezing or shortness of breath. 24 g 3   baclofen (LIORESAL) 20 MG tablet TAKE 1 TABLET THREE TIMES A DAY (UPDATED PRESCRIPTION) (Patient taking differently: Take 20 mg by mouth 3 (three) times daily.) 270 tablet 3   Blood Glucose Monitoring Suppl (FREESTYLE FREEDOM LITE) w/Device KIT Use as directed to check blood sugar once daily. 1 kit 0   Calcium Carb-Cholecalciferol (CALTRATE BONE HEALTH PO) Take 1 tablet by mouth daily.     Calcium Citrate (CALCITRATE PO) Take 1 tablet by mouth daily.     clobetasol cream (TEMOVATE) 0.05 % APPLY 1 APPLICATION TOPICALLY TWICE A DAY 120 g 5   Cyanocobalamin (VITAMIN B-12 PO) Take 1 tablet by mouth every other day.     dorzolamide-timolol (COSOPT) 2-0.5 % ophthalmic solution Place 1 drop into the left eye 2 (two) times daily.     Elastic Bandages & Supports (TRUFORM ARM SLEEVE L 15-20MMHG) MISC 1 Device by Does not apply route as directed. 1 each 1   fluticasone (FLONASE) 50 MCG/ACT nasal spray USE 2 SPRAYS IN EACH NOSTRIL DAILY (Patient taking differently: Place 1 spray into both nostrils 2 (two) times daily.) 48 g 3   fluticasone-salmeterol (ADVAIR DISKUS) 100-50 MCG/ACT AEPB USE 1 INHALATION EVERY 12 HOURS, RINSE MOUTH AFTER USING (Patient taking differently: Inhale 1 puff into the lungs 2 (two) times daily.) 180 each 3   FREESTYLE LITE test strip USE AS INSTRUCTED TO CHECK BLOOD SUGAR ONCE DAILY 100 strip 3   furosemide (LASIX) 40 MG tablet Take 1 tablet (40 mg total) by mouth 2 (two) times daily. 180 tablet 3   furosemide (LASIX) 40 MG tablet Take 1 tablet (40 mg total) by mouth 2 (two) times daily. 60  tablet 0   gabapentin (NEURONTIN) 600 MG tablet TAKE 1 TABLET THREE TIMES A DAY (DOSE CHANGE) 270 tablet 3   HYDROcodone-acetaminophen (NORCO) 5-325 MG tablet Take 1 tablet by mouth 2 (two) times daily as needed for moderate pain. (Patient taking differently: Take  0.5 tablets by mouth 2 (two) times daily as needed for severe pain (pain score 7-10).) 50 tablet 0   Lancets (FREESTYLE) lancets USE AS INSTRUCTED TO CHECK BLOOD SUGAR ONCE DAILY 100 each 1   levothyroxine (SYNTHROID) 112 MCG tablet Take 1 tablet (112 mcg total) by mouth daily. 90 tablet 3   Magnesium 250 MG TABS Take 250 mg by mouth 2 (two) times daily.     omeprazole (PRILOSEC) 40 MG capsule Take 1 capsule (40 mg total) by mouth daily.     Polyvinyl Alcohol (LIQUID TEARS OP) Place 1 drop into the right eye 2 (two) times daily.     potassium chloride SA (KLOR-CON M) 20 MEQ tablet Take 2 tablets (40 mEq total) by mouth 2 (two) times daily. (Patient taking differently: Take 20 mEq by mouth 4 (four) times daily.) 360 tablet 3   pravastatin (PRAVACHOL) 10 MG tablet TAKE 1 TABLET DAILY 90 tablet 3   senna-docusate (SENOKOT-S) 8.6-50 MG tablet Take 1 tablet by mouth daily. 30 tablet 4   spironolactone (ALDACTONE) 100 MG tablet Take 100 mg by mouth daily.     temazepam (RESTORIL) 15 MG capsule Take 1 capsule (15 mg total) by mouth at bedtime as needed for sleep. 90 capsule 1   ursodiol (ACTIGALL) 300 MG capsule TAKE 2 CAPSULES TWICE A DAY 360 capsule 3   Vitamin D, Ergocalciferol, 50000 units CAPS Take 1 capsule by mouth once a week. (Patient taking differently: Take 50,000 Units by mouth every Friday.) 12 capsule 3   warfarin (COUMADIN) 5 MG tablet TAKE 2 TABLETS BY MOUTH DAILY OR AS DIRECTED BY ANTICOAGULATION CLINIC 205 tablet 3   enoxaparin (LOVENOX) 150 MG/ML injection Inject 1 mL (150 mg total) into the skin daily. AS DIRECTED BY ANTICOAGULATION CLINIC (Patient not taking: Reported on 07/10/2023) 20 mL 0   No current facility-administered medications for this visit.    PHYSICAL EXAM: Vitals:   07/10/23 1015  BP: 126/80  Pulse: (!) 55  Resp: 16  SpO2: 97%  Weight: 221 lb 6.4 oz (100.4 kg)  Height: 5\' 8"  (1.727 m)   Body mass index is 33.66 kg/m.  Wt Readings from Last 3 Encounters:   07/10/23 221 lb 6.4 oz (100.4 kg)  07/06/23 225 lb 3.2 oz (102.2 kg)  05/23/23 220 lb (99.8 kg)    General: Well developed, well nourished female in no apparent distress.  HEENT: AT/[place], no external lesions.  Eyes: Conjunctiva clear and no icterus. Neck: Neck supple  Lungs: Respirations not labored Neurologic: Alert, oriented, normal speech Extremities / Skin: Dry.  Psychiatric: Does not appear depressed or anxious  Diabetic Foot Exam - Simple   No data filed    LABS Reviewed Lab Results  Component Value Date   HGBA1C 6.9 (A) 07/10/2023   HGBA1C 7.1 (H) 04/05/2023   HGBA1C 7.0 (H) 01/11/2023   No results found for: "FRUCTOSAMINE" Lab Results  Component Value Date   CHOL 206 (H) 01/11/2023   HDL 79.30 01/11/2023   LDLCALC 113 (H) 01/11/2023   TRIG 64.0 01/11/2023   CHOLHDL 3 01/11/2023   Lab Results  Component Value Date   MICRALBCREAT <15 06/26/2023   MICRALBCREAT 0.8 04/26/2021   Lab Results  Component  Value Date   CREATININE 1.08 07/06/2023   Lab Results  Component Value Date   GFR 50.23 (L) 07/06/2023    ASSESSMENT / PLAN  1. DM type 2 with diabetic peripheral neuropathy (HCC)   2. Hypoglycemia following gastrointestinal surgery   3. Stage 3b chronic kidney disease (HCC)   4. Acquired autoimmune hypothyroidism   5. History of gastric surgery    Diabetes Mellitus type 2, complicated by peripheral neuropathy/CKD - Diabetic status / severity: Fair control  Lab Results  Component Value Date   HGBA1C 6.9 (A) 07/10/2023    - Hemoglobin A1c goal : <7%  - Medications: No change  I) continue acarbose 50 mg with meals 3 times a day.  - Home glucose testing: In the morning fasting and few times a week in the afternoon and at bedtime. - Discussed/ Gave Hypoglycemia treatment plan.  # Consult was referred to nephrology in last visit due to CKD.   # Annual urine for microalbuminuria/ creatinine ratio, no microalbuminuria currently.  Referred to  nephrology due to CKD 3B. Last  Lab Results  Component Value Date   MICRALBCREAT <15 06/26/2023    # Foot check nightly / neuropathy, on gabapentin and nortriptyline.  Following with podiatry.  Patient reports she had visit last week and had foot exam as well.  # Annual dilated diabetic eye exams.   - Diet: Make healthy diabetic food choices - Life style / activity / exercise: Discussed.  2. Blood pressure  -  BP Readings from Last 1 Encounters:  07/10/23 126/80    - Control is in target.  - No change in current plans.  3. Lipid status / Hyperlipidemia - Last  Lab Results  Component Value Date   LDLCALC 113 (H) 01/11/2023   - Continue pravastatin 10 mg daily.  Managed by primary care provider.  # Postprandial hypoglycemia related with history of gastric bypass surgery. -Continue acarbose 50 mg with meals 3 times a day.  # Primary hypothyroidism -Continue levothyroxine 112 mcg daily.  Patient is advised to take levothyroxine in the morning and iron supplement at bedtime.   Diagnoses and all orders for this visit:  DM type 2 with diabetic peripheral neuropathy (HCC) -     POCT glycosylated hemoglobin (Hb A1C)  Hypoglycemia following gastrointestinal surgery  Stage 3b chronic kidney disease (HCC)  Acquired autoimmune hypothyroidism  History of gastric surgery     DISPOSITION Follow up in clinic in 4 months suggested.  Lab on same day of the visit.   All questions answered and patient verbalized understanding of the plan.  [place] Joia Doyle, MD [facility] Endocrinology [facility] [address] [place], [address] [place], [place] [address] Phone # [phone]  At least part of this note was generated using voice recognition software. Inadvertent word errors may have occurred, which were not recognized during the proofreading process.

## 2023-07-10 NOTE — Patient Instructions (Signed)
No change in medication.  Take iron supplement at bedtime and levorthyroxine in the morning,.

## 2023-07-14 ENCOUNTER — Other Ambulatory Visit: Payer: Self-pay | Admitting: Internal Medicine

## 2023-07-14 ENCOUNTER — Other Ambulatory Visit: Payer: Self-pay | Admitting: Family Medicine

## 2023-07-20 ENCOUNTER — Ambulatory Visit (INDEPENDENT_AMBULATORY_CARE_PROVIDER_SITE_OTHER): Payer: Medicare Other

## 2023-07-20 DIAGNOSIS — Z7901 Long term (current) use of anticoagulants: Secondary | ICD-10-CM

## 2023-07-20 LAB — POCT INR: INR: 1.7 — AB (ref 2.0–3.0)

## 2023-07-20 NOTE — Patient Instructions (Addendum)
Pre visit review using our clinic review tool, if applicable. No additional management support is needed unless otherwise documented below in the visit note.  Increase dose today to take 2 tablets and then change weekly dose to take 1 1/2 tablets daily except take 2 tablets on Wednesdays. Recheck in 4 weeks.

## 2023-07-20 NOTE — Progress Notes (Signed)
Increase dose today to take 2 tablets and then change weekly dose to take 1 1/2 tablets daily except take 2 tablets on Wednesdays. Recheck in 4 weeks.

## 2023-07-27 ENCOUNTER — Other Ambulatory Visit: Payer: Self-pay

## 2023-07-27 DIAGNOSIS — E1142 Type 2 diabetes mellitus with diabetic polyneuropathy: Secondary | ICD-10-CM

## 2023-07-27 MED ORDER — VITAMIN D (ERGOCALCIFEROL) 50000 UNITS PO CAPS: 50000.0000 [IU] | ORAL_CAPSULE | ORAL | 3 refills | Status: DC

## 2023-07-31 ENCOUNTER — Other Ambulatory Visit: Payer: Self-pay

## 2023-08-07 ENCOUNTER — Other Ambulatory Visit: Payer: Self-pay

## 2023-08-07 DIAGNOSIS — E1142 Type 2 diabetes mellitus with diabetic polyneuropathy: Secondary | ICD-10-CM

## 2023-08-10 ENCOUNTER — Encounter (INDEPENDENT_AMBULATORY_CARE_PROVIDER_SITE_OTHER): Payer: Medicare Other | Admitting: Ophthalmology

## 2023-08-10 DIAGNOSIS — H2511 Age-related nuclear cataract, right eye: Secondary | ICD-10-CM

## 2023-08-10 DIAGNOSIS — H353114 Nonexudative age-related macular degeneration, right eye, advanced atrophic with subfoveal involvement: Secondary | ICD-10-CM

## 2023-08-10 DIAGNOSIS — I10 Essential (primary) hypertension: Secondary | ICD-10-CM | POA: Diagnosis not present

## 2023-08-10 DIAGNOSIS — H35372 Puckering of macula, left eye: Secondary | ICD-10-CM | POA: Diagnosis not present

## 2023-08-10 DIAGNOSIS — H43811 Vitreous degeneration, right eye: Secondary | ICD-10-CM

## 2023-08-10 DIAGNOSIS — H35033 Hypertensive retinopathy, bilateral: Secondary | ICD-10-CM

## 2023-08-14 ENCOUNTER — Other Ambulatory Visit: Payer: Self-pay | Admitting: Family Medicine

## 2023-08-14 DIAGNOSIS — R252 Cramp and spasm: Secondary | ICD-10-CM

## 2023-08-14 DIAGNOSIS — I872 Venous insufficiency (chronic) (peripheral): Secondary | ICD-10-CM

## 2023-08-17 ENCOUNTER — Ambulatory Visit (INDEPENDENT_AMBULATORY_CARE_PROVIDER_SITE_OTHER): Payer: Medicare Other

## 2023-08-17 DIAGNOSIS — Z7901 Long term (current) use of anticoagulants: Secondary | ICD-10-CM

## 2023-08-17 LAB — POCT INR: INR: 1.6 — AB (ref 2.0–3.0)

## 2023-08-17 NOTE — Patient Instructions (Addendum)
Pre visit review using our clinic review tool, if applicable. No additional management support is needed unless otherwise documented below in the visit note.  Increase dose today to take 2 1/2 tablets and increase dose tomorrow to take 2 tablets then change weekly dose to take 2 tablets daily except take 1 1/2 tablets on Tuesday, Thursday and Saturday. Recheck in 3 weeks.

## 2023-08-17 NOTE — Progress Notes (Signed)
Pt reports eating more greens lately. Advised to make sure intake is consistent. Pt verbalized understanding.  Increase dose today to take 2 1/2 tablets and increase dose tomorrow to take 2 tablets then change weekly dose to take 2 tablets daily except take 1 1/2 tablets on Tuesday, Thursday and Saturday. Recheck in 3 weeks per pt and daughter's request.

## 2023-09-05 ENCOUNTER — Other Ambulatory Visit: Payer: Self-pay

## 2023-09-05 ENCOUNTER — Other Ambulatory Visit: Payer: Self-pay | Admitting: Family Medicine

## 2023-09-05 ENCOUNTER — Encounter: Payer: Self-pay | Admitting: Family Medicine

## 2023-09-05 ENCOUNTER — Other Ambulatory Visit: Payer: Self-pay | Admitting: Endocrinology

## 2023-09-05 DIAGNOSIS — E1142 Type 2 diabetes mellitus with diabetic polyneuropathy: Secondary | ICD-10-CM

## 2023-09-05 DIAGNOSIS — E063 Autoimmune thyroiditis: Secondary | ICD-10-CM

## 2023-09-05 MED ORDER — ACARBOSE 50 MG PO TABS: ORAL_TABLET | ORAL | 1 refills | Status: DC

## 2023-09-05 MED ORDER — LEVOTHYROXINE SODIUM 112 MCG PO TABS: 112.0000 ug | ORAL_TABLET | Freq: Every day | ORAL | 3 refills | Status: DC

## 2023-09-05 MED ORDER — FREESTYLE LITE TEST VI STRP: ORAL_STRIP | 3 refills | Status: AC

## 2023-09-05 NOTE — Telephone Encounter (Signed)
 This encounter was created in error - please disregard.

## 2023-09-05 NOTE — Telephone Encounter (Signed)
 Copied from CRM [id number]. Topic: Clinical - Medication Refill >> Sep 05, 2023  4:34 PM Viola F wrote: Most Recent Primary Care Visit:  Provider: ETHYL KIRSCH  Department: [facility] [place]  Visit Type: COUMADIN  CLINIC  Date: 08/17/2023  Medication: spironolactone  (ALDACTONE ) 100 MG tablet [[id number]]  Has the patient contacted their pharmacy? Yes, patient would like meds sent here up until her mail delivery comes via [facility]   (Agent: If no, request that the patient contact the pharmacy for the refill. If patient does not wish to contact the pharmacy document the reason why and proceed with request.) (Agent: If yes, when and what did the pharmacy advise?)  Is this the correct pharmacy for this prescription? Yes, patient would like a few pills sent here until her mail deliver comes   If no, delete pharmacy and type the correct one.  This is the patient's preferred pharmacy:   [id number] [place], [place] - [address] AT [facility] [address] & [address] [place] [address] Phone: [phone] Fax: [phone]   Has the prescription been filled recently? Yes  Is the patient out of the medication? Yes  Has the patient been seen for an appointment in the last year OR does the patient have an upcoming appointment? Yes  Can we respond through MyChart? Yes  Agent: Please be advised that Rx refills may take up to 3 business days. We ask that you follow-up with your pharmacy.

## 2023-09-05 NOTE — Telephone Encounter (Signed)
 Copied from CRM [id number]. Topic: Clinical - Medication Refill >> Sep 05, 2023  4:32 PM Viola FALCON wrote: Most Recent Primary Care Visit:  Provider: ETHYL KIRSCH  Department: [facility] [place]  Visit Type: COUMADIN  CLINIC  Date: 08/17/2023  Medication: spironolactone  (ALDACTONE ) 100 MG tablet  Has the patient contacted their pharmacy? Yes pharmacy says it was denied - needs new scripts  (Agent: If no, request that the patient contact the pharmacy for the refill. If patient does not wish to contact the pharmacy document the reason why and proceed with request.) (Agent: If yes, when and what did the pharmacy advise?)  Is this the correct pharmacy for this prescription? Yes If no, delete pharmacy and type the correct one.  This is the patient's preferred pharmacy:    [facility] - [place], [place] - [address] [place] [address] Phone: [phone] Fax: [phone]   Has the prescription been filled recently? Yes  Is the patient out of the medication? Yes  Has the patient been seen for an appointment in the last year OR does the patient have an upcoming appointment? Yes  Can we respond through MyChart? Yes  Agent: Please be advised that Rx refills may take up to 3 business days. We ask that you follow-up with your pharmacy.

## 2023-09-07 ENCOUNTER — Ambulatory Visit (INDEPENDENT_AMBULATORY_CARE_PROVIDER_SITE_OTHER): Payer: Medicare Other

## 2023-09-07 ENCOUNTER — Ambulatory Visit: Payer: Medicare Other | Admitting: Internal Medicine

## 2023-09-07 DIAGNOSIS — Z7901 Long term (current) use of anticoagulants: Secondary | ICD-10-CM | POA: Diagnosis not present

## 2023-09-07 LAB — POCT INR: INR: 2.6 (ref 2.0–3.0)

## 2023-09-07 NOTE — Progress Notes (Signed)
 Continue 2 tablets daily except take 1 1/2 tablets on Tuesday, Thursday and Saturday. Recheck in 6 weeks per pt and daughter's request.

## 2023-09-07 NOTE — Patient Instructions (Addendum)
 Pre visit review using our clinic review tool, if applicable. No additional management support is needed unless otherwise documented below in the visit note.  Continue 2 tablets daily except take 1 1/2 tablets on Tuesday, Thursday and Saturday. Recheck in 6 weeks.

## 2023-09-15 NOTE — Progress Notes (Unsigned)
Subjective:    Patient ID: Kelli Martinez, female    DOB: 08-20-1945, 78 y.o.   MRN: [id number]  HPI F former smoker followed for OSA/ quit CPAP, allergic rhinitis,asthma,  hx pulm embolism/DVT/filter/coumadin(Dr Lucianne Muss), hx R breast Ca/ mastectomy/ xrt/chem/bonemarrow transplant at [facility], hx gastric bypass, DM 2 Office Spirometry 04/27/2015-WNL-FEV1/FVC 0.81, FEV1 2.50/110% HST- 12/31/19- AHI 9.1/ hr, desaturation to 88%, body weight 219 lbs ----------------------------------------------------------------------------------------   09/14/21- 78 yoF former smoker followed for Allergic Rhinitis, Asthma, OSA/ quit CPAP, insomnia,   hx pulm embolism/DVT/filter/coumadin(Dr Lucianne Muss), hx R breast Ca/ mastectomy/ xrt/chem/bone marrow transplant at [facility], hx gastric bypass, DM 2, Chronic peripheral Edema, Dr Lucianne Muss manages peripheral edema. -Albuterol hfa, Advair 100, temazepam 15,  Body weight today- Covid vax- 3 Moderna Flu vax had ACT score 19 - ----States she has not been sleeping well.  She says over the last couple of months she has only been sleeping a few hours a night-unclear why.  Temazepam 15 mg not strong enough.  Catnaps in recliner some in the daytime.  She indicates she wants to go back to CPAP which we discussed. Bothersome postnasal drip.  She takes baby strength Claritin and Flonase.  Her problem is dry eyes, which do not tolerate adult strength antihistamines.  09/08/23- 78 yoF former smoker followed for Allergic Rhinitis, Asthma, OSA/ quit CPAP, insomnia,   hx pulm embolism/DVT/filter/coumadin(Dr Lucianne Muss), hx R breast Ca/ mastectomy/ xrt/chem/bone marrow transplant at [facility], hx gastric bypass, DM 2, Chronic peripheral Edema, Dr Lucianne Muss manages peripheral edema. HST 11/02/22- AHI 4.6/hr, desaat to 77%, body weight 219 lbs -Albuterol hfa, Advair 100, temazepam 15,  Body weight today-   CT 01/04/23 Lungs/Pleura: Fibrotic changes are noted at the right lung apex. There is bronchiectasis with  fibrotic changes in the anterior aspect of the right upper and middle lobes, likely related to radiation therapy changes. Atelectasis is noted bilaterally. There is a subpleural nodule in the left upper lobe measuring 3 mm, unchanged from 201   Review of Systems- see HPI + = positive Constitutional:   No-   weight loss, night sweats, fevers, chills, fatigue, lassitude. HEENT:   No-  headaches, difficulty swallowing, tooth/dental problems, sore throat,   +dry mouth      No-  sneezing, itching, ear ache,  +nasal congestion, +post nasal drip, +blind R eye CV:  No-   chest pain, orthopnea, PND, swelling in lower extremities, anasarca, dizziness, palpitations Resp: No-   shortness of breath with exertion or at rest.              No-   productive cough,   non-productive cough,  No-  coughing up of blood.              No-   change in color of mucus.   Skin: No-   rash or lesions. GI:  No-   heartburn, indigestion, abdominal pain, nausea, vomiting,  GU:  MS:  No-   joint pain,    swelling.   Neuro- : +HPI Psych:  No- change in mood or affect. No depression or anxiety.  No memory loss.   Objective:   Physical Exam General- Alert, Oriented, Affect-appropriate, Distress- none acute  + obese( most of weight is in her legs and hips) Skin- rash-none, lesions- none, excoriation- none. XRT skin changes right lateral neck Lymphadenopathy- none Head- atraumatic            Eyes- +blind R eye,  clear conjunctivae  Ears- Hearing, canals, TMs normal            Nose- Clear, no- Septal dev, + sticky mucus, polyps, erosion, perforation             Throat- Mallampati III , mucosa clear , drainage- none, tonsils- atrophic, +missing teeth Neck- flexible , trachea midline, no stridor , thyroid nl, carotid no bruit Chest - symmetrical excursion , unlabored           Heart/CV- RRR , no murmur , no gallop  , no rub, nl s1 s2                           - JVD+1, edema+1-2, stasis changes- , varices- none            Lung- clear to P&A, wheeze-none, cough- none , dullness-none, rub- none           Chest wall- + right mastectomy Abd-  Br/ Gen/ Rectal- Not done, not indicated Extrem- cyanosis- none, clubbing- none, atrophy- none, strength- nl   Heavy legs. + cane Neuro- grossly intact to observation  Assessment & Plan:

## 2023-09-18 ENCOUNTER — Ambulatory Visit (INDEPENDENT_AMBULATORY_CARE_PROVIDER_SITE_OTHER): Payer: Medicare Other | Admitting: Internal Medicine

## 2023-09-18 ENCOUNTER — Encounter: Payer: Self-pay | Admitting: Internal Medicine

## 2023-09-18 VITALS — BP 122/81 | HR 51 | Temp 97.5°F | Ht 67.5 in | Wt 235.4 lb

## 2023-09-18 DIAGNOSIS — G4733 Obstructive sleep apnea (adult) (pediatric): Secondary | ICD-10-CM | POA: Diagnosis not present

## 2023-09-18 DIAGNOSIS — J45909 Unspecified asthma, uncomplicated: Secondary | ICD-10-CM

## 2023-09-18 DIAGNOSIS — J4530 Mild persistent asthma, uncomplicated: Secondary | ICD-10-CM

## 2023-09-18 MED ORDER — TEMAZEPAM 15 MG PO CAPS: 15.0000 mg | ORAL_CAPSULE | Freq: Every evening | ORAL | 1 refills | Status: AC | PRN

## 2023-09-18 NOTE — Progress Notes (Unsigned)
Patient seen in the office today and instructed on use of Spiriva.  Patient expressed understanding and demonstrated technique. 

## 2023-09-18 NOTE — Patient Instructions (Signed)
Order- sample x 2 Stiolto inhaler    inhale 2 puffs daily   Try this instead of Advair  Instead of Flonase (fluticasone) nasal spray- try a non-sedating antihistamine over the counter, like claritin or zyrtec  Script sent changing temazepam to 15 mg for sleep as needed

## 2023-09-20 NOTE — Assessment & Plan Note (Signed)
We can refill meds. I am changing a couple of inhalers to remove steroid component because she had some yeast esophagitis note during EGD.

## 2023-09-20 NOTE — Assessment & Plan Note (Signed)
No longer qualifies for CPAP, based on normal sleep study. Discussed with her.

## 2023-09-24 ENCOUNTER — Other Ambulatory Visit: Payer: Self-pay

## 2023-09-24 ENCOUNTER — Other Ambulatory Visit: Payer: Self-pay | Admitting: Internal Medicine

## 2023-09-24 DIAGNOSIS — E1142 Type 2 diabetes mellitus with diabetic polyneuropathy: Secondary | ICD-10-CM

## 2023-09-24 MED ORDER — VITAMIN D (ERGOCALCIFEROL) 50000 UNITS PO CAPS: 50000.0000 [IU] | ORAL_CAPSULE | ORAL | 3 refills | Status: DC

## 2023-09-24 NOTE — Telephone Encounter (Signed)
Albuterol hfa refilled 

## 2023-09-24 NOTE — Telephone Encounter (Signed)
 Pt is requesting med refill for albuterol inhaler. Medication is not listed on lov note. Please advise Dr. Maple Hudson

## 2023-10-02 ENCOUNTER — Inpatient Hospital Stay (HOSPITAL_BASED_OUTPATIENT_CLINIC_OR_DEPARTMENT_OTHER): Payer: Medicare Other | Admitting: Hematology and Oncology

## 2023-10-02 ENCOUNTER — Inpatient Hospital Stay: Payer: Medicare Other | Attending: Hematology and Oncology

## 2023-10-02 VITALS — BP 120/61 | HR 52 | Temp 97.6°F | Resp 20 | Ht 67.5 in | Wt 238.5 lb

## 2023-10-02 DIAGNOSIS — D509 Iron deficiency anemia, unspecified: Secondary | ICD-10-CM | POA: Diagnosis not present

## 2023-10-02 DIAGNOSIS — Z9884 Bariatric surgery status: Secondary | ICD-10-CM | POA: Diagnosis not present

## 2023-10-02 DIAGNOSIS — D6489 Other specified anemias: Secondary | ICD-10-CM

## 2023-10-02 DIAGNOSIS — Z79899 Other long term (current) drug therapy: Secondary | ICD-10-CM | POA: Insufficient documentation

## 2023-10-02 LAB — CBC WITH DIFFERENTIAL (CANCER CENTER ONLY)
Abs Immature Granulocytes: 0.01 10*3/uL (ref 0.00–0.07)
Basophils Absolute: 0.1 10*3/uL (ref 0.0–0.1)
Basophils Relative: 2 %
Eosinophils Absolute: 0.1 10*3/uL (ref 0.0–0.5)
Eosinophils Relative: 2 %
HCT: 36.3 % (ref 36.0–46.0)
Hemoglobin: 11.4 g/dL — ABNORMAL LOW (ref 12.0–15.0)
Immature Granulocytes: 0 %
Lymphocytes Relative: 31 %
Lymphs Abs: 1.2 10*3/uL (ref 0.7–4.0)
MCH: 27.8 pg (ref 26.0–34.0)
MCHC: 31.4 g/dL (ref 30.0–36.0)
MCV: 88.5 fL (ref 80.0–100.0)
Monocytes Absolute: 0.2 10*3/uL (ref 0.1–1.0)
Monocytes Relative: 6 %
Neutro Abs: 2.4 10*3/uL (ref 1.7–7.7)
Neutrophils Relative %: 59 %
Platelet Count: 214 10*3/uL (ref 150–400)
RBC: 4.1 MIL/uL (ref 3.87–5.11)
RDW: 14.6 % (ref 11.5–15.5)
WBC Count: 4 10*3/uL (ref 4.0–10.5)
nRBC: 0 % (ref 0.0–0.2)

## 2023-10-02 LAB — CMP (CANCER CENTER ONLY)
ALT: 11 U/L (ref 0–44)
AST: 15 U/L (ref 15–41)
Albumin: 3.9 g/dL (ref 3.5–5.0)
Alkaline Phosphatase: 89 U/L (ref 38–126)
Anion gap: 6 (ref 5–15)
BUN: 13 mg/dL (ref 8–23)
CO2: 30 mmol/L (ref 22–32)
Calcium: 9.6 mg/dL (ref 8.9–10.3)
Chloride: 105 mmol/L (ref 98–111)
Creatinine: 0.81 mg/dL (ref 0.44–1.00)
GFR, Estimated: >60 mL/min (ref >60)
Glucose, Bld: 99 mg/dL (ref 70–99)
Potassium: 3.9 mmol/L (ref 3.5–5.1)
Sodium: 141 mmol/L (ref 135–145)
Total Bilirubin: 0.3 mg/dL (ref 0.0–1.2)
Total Protein: 7.1 g/dL (ref 6.5–8.1)

## 2023-10-02 LAB — IRON AND IRON BINDING CAPACITY (CC-WL,HP ONLY)
Iron: 72 ug/dL (ref 28–170)
Saturation Ratios: 18 % (ref 10.4–31.8)
TIBC: 399 ug/dL (ref 250–450)
UIBC: 327 ug/dL (ref 148–442)

## 2023-10-02 LAB — FERRITIN: Ferritin: 411 ng/mL — ABNORMAL HIGH (ref 11–307)

## 2023-10-02 NOTE — Progress Notes (Signed)
 Patient Care Team: Deeann Saint, MD as PCP - General (Family Medicine) Maisie Fus, MD as PCP - Cardiology (Cardiology) Glendale Chard, DO as Consulting Physician (Neurology) [name], MD (Inactive) as Consulting Physician (Endocrinology)  DIAGNOSIS:  Encounter Diagnosis  Name Primary?   Iron deficiency anemia, unspecified iron deficiency anemia type Yes    CHIEF COMPLIANT: Follow-up of anemia  HISTORY OF PRESENT ILLNESS:   History of Present Illness The patient, with a history of anemia, presents for a follow-up visit. She reports that her anemia has been well managed and is now considered mild. The patient's hemoglobin levels have been stable, with a slight decrease noted in the most recent blood work. The patient also reports a history of constipation, which she has been managing with apple cider vinegar pills. This has resulted in regular bowel movements and an improvement in her digestive health. The patient is on multiple medications, including Lasix, gabapentin, Synthroid, and Coumadin. She has recently stopped taking spironolactone due to prescription issues.     ALLERGIES:  is allergic to adhesive [tape].  MEDICATIONS:  Current Outpatient Medications  Medication Sig Dispense Refill   acarbose (PRECOSE) 50 MG tablet Take 50 mg by mouth 3 (three) times daily 14 tablet 1   albuterol (ACCUNEB) 0.63 MG/3ML nebulizer solution      albuterol (VENTOLIN HFA) 108 (90 Base) MCG/ACT inhaler USE 2 INHALATIONS EVERY 6 HOURS AS NEEDED FOR WHEEZING OR SHORTNESS OF BREATH 25.5 g 3   Blood Glucose Monitoring Suppl (FREESTYLE FREEDOM LITE) w/Device KIT Use as directed to check blood sugar once daily. 1 kit 0   Calcium Carb-Cholecalciferol (CALTRATE BONE HEALTH PO) Take 1 tablet by mouth daily.     Calcium Citrate (CALCITRATE PO) Take 1 tablet by mouth daily.     clobetasol cream (TEMOVATE) 0.05 % APPLY 1 APPLICATION TOPICALLY TWICE A DAY 120 g 5   Cyanocobalamin (VITAMIN B-12  PO) Take 1 tablet by mouth every other day.     dorzolamide-timolol (COSOPT) 2-0.5 % ophthalmic solution Place 1 drop into the left eye 2 (two) times daily.     Elastic Bandages & Supports (TRUFORM ARM SLEEVE L 15-20MMHG) MISC 1 Device by Does not apply route as directed. 1 each 1   furosemide (LASIX) 40 MG tablet Take 1 tablet (40 mg total) by mouth 2 (two) times daily. 180 tablet 3   furosemide (LASIX) 40 MG tablet Take 1 tablet (40 mg total) by mouth 2 (two) times daily. 60 tablet 0   gabapentin (NEURONTIN) 600 MG tablet TAKE 1 TABLET THREE TIMES A DAY (DOSE CHANGE) 270 tablet 3   glucose blood (FREESTYLE LITE) test strip Use as instructed 100 strip 3   HYDROcodone-acetaminophen (NORCO) 5-325 MG tablet Take 1 tablet by mouth 2 (two) times daily as needed for moderate pain. (Patient taking differently: Take 0.5 tablets by mouth 2 (two) times daily as needed for severe pain (pain score 7-10).) 50 tablet 0   Lancets (FREESTYLE) lancets USE AS INSTRUCTED TO CHECK BLOOD SUGAR ONCE DAILY 100 each 1   levothyroxine (SYNTHROID) 112 MCG tablet Take 1 tablet (112 mcg total) by mouth daily. 90 tablet 3   Magnesium 250 MG TABS Take 250 mg by mouth 2 (two) times daily.     Polyvinyl Alcohol (LIQUID TEARS OP) Place 1 drop into the right eye 2 (two) times daily.     potassium chloride SA (KLOR-CON M) 20 MEQ tablet TAKE 2 TABLETS TWICE A DAY 360 tablet 3  pravastatin (PRAVACHOL) 10 MG tablet TAKE 1 TABLET DAILY 90 tablet 3   temazepam (RESTORIL) 15 MG capsule Take 1 capsule (15 mg total) by mouth at bedtime as needed for sleep. 90 capsule 1   ursodiol (ACTIGALL) 300 MG capsule TAKE 2 CAPSULES TWICE A DAY 360 capsule 3   Vitamin D, Ergocalciferol, 50000 units CAPS Take 50,000 Units by mouth every Friday. 12 capsule 3   warfarin (COUMADIN) 5 MG tablet TAKE 2 TABLETS BY MOUTH DAILY OR AS DIRECTED BY ANTICOAGULATION CLINIC 205 tablet 3   baclofen (LIORESAL) 20 MG tablet TAKE 1 TABLET THREE TIMES A DAY (UPDATED  PRESCRIPTION) 270 tablet 3   omeprazole (PRILOSEC) 40 MG capsule Take 1 capsule (40 mg total) by mouth daily. (Patient not taking: Reported on 10/02/2023) 180 capsule 1   spironolactone (ALDACTONE) 100 MG tablet Take 100 mg by mouth daily. (Patient not taking: Reported on 10/02/2023)     No current facility-administered medications for this visit.    PHYSICAL EXAMINATION: ECOG PERFORMANCE STATUS: 1 - Symptomatic but completely ambulatory  Vitals:   10/02/23 0856  BP: 120/61  Pulse: (!) 52  Resp: 20  Temp: 97.6 F (36.4 C)  SpO2: 100%   Filed Weights   10/02/23 0856  Weight: 238 lb 8 oz (108.2 kg)     LABORATORY DATA:  I have reviewed the data as listed    Latest Ref Rng & Units 10/02/2023    8:05 AM 07/06/2023   10:02 AM 04/05/2023   11:29 AM  CMP  Glucose 70 - 99 mg/dL 99  829  562   BUN 8 - 23 mg/dL 13  22  25    Creatinine 0.44 - 1.00 mg/dL 1.30  8.65  7.84   Sodium 135 - 145 mmol/L 141  139  138   Potassium 3.5 - 5.1 mmol/L 3.9  4.9  4.1   Chloride 98 - 111 mmol/L 105  100  97   CO2 22 - 32 mmol/L 30  34  31   Calcium 8.9 - 10.3 mg/dL 9.6  9.7  9.8   Total Protein 6.5 - 8.1 g/dL 7.1  6.9    Total Bilirubin 0.0 - 1.2 mg/dL 0.3  0.4    Alkaline Phos 38 - 126 U/L 89  103    AST 15 - 41 U/L 15  14    ALT 0 - 44 U/L 11  11      Lab Results  Component Value Date   WBC 4.0 10/02/2023   HGB 11.4 (L) 10/02/2023   HCT 36.3 10/02/2023   MCV 88.5 10/02/2023   PLT 214 10/02/2023   NEUTROABS 2.4 10/02/2023    ASSESSMENT & PLAN:  Iron deficiency anemia Gastric bypass surgery in 2011 had history of iron deficiency from that time where she tells me that she received IV iron. Patient was referred to Dr. Clelia Croft in September 2023 to evaluate for iron deficiency.  In July 2023 her hemoglobin was 11.7, MCV 87.9, iron 57, saturation 11.7%, ferritin 674.3.  The IBC 485.8. Has been on oral iron She underwent an upper endoscopy on March 03, 2022 that demonstrated reflux and gastric  irritation and she was started on omeprazole since then.   Lab review:  07/06/2023: Hemoglobin 12.5, MCV 88.2, ferritin 488, iron saturation 22% 10/02/2023: Hemoglobin 11.4, MCV 88.5, iron studies are pending   Treatment plan:Monitor with labs and F/U on an as-needed basis.  We would like to see him back if the hemoglobin drops below  10.     No orders of the defined types were placed in this encounter.  The patient has a good understanding of the overall plan. she agrees with it. she will call with any problems that may develop before the next visit here. Total time spent: 30 mins including face to face time and time spent for planning, charting and co-ordination of care   Tamsen Meek, MD 10/02/23

## 2023-10-02 NOTE — Assessment & Plan Note (Signed)
 Gastric bypass surgery in 2011 had history of iron deficiency from that time where she tells me that she received IV iron. Patient was referred to Dr. Clelia Croft in September 2023 to evaluate for iron deficiency.  In July 2023 her hemoglobin was 11.7, MCV 87.9, iron 57, saturation 11.7%, ferritin 674.3.  The IBC 485.8. Has been on oral iron She underwent an upper endoscopy on March 03, 2022 that demonstrated reflux and gastric irritation and she was started on omeprazole since then.   Lab review:  07/06/2023: Hemoglobin 12.5, MCV 88.2, ferritin 488, iron saturation 22%   Treatment plan:Monitor with labs and F/U on an as-needed basis.  We would like to see him back if the hemoglobin drops below 10.

## 2023-10-04 ENCOUNTER — Telehealth: Payer: Medicare Other | Admitting: Family Medicine

## 2023-10-04 ENCOUNTER — Encounter: Payer: Self-pay | Admitting: Family Medicine

## 2023-10-04 NOTE — Progress Notes (Signed)
 Patient was unable to self-report due to a lack of equipment at home via telehealth

## 2023-10-07 NOTE — Progress Notes (Signed)
 Appt rescheduled as pt had difficulty using virtual platform.

## 2023-10-15 ENCOUNTER — Other Ambulatory Visit: Payer: Self-pay | Admitting: Internal Medicine

## 2023-10-17 ENCOUNTER — Ambulatory Visit (INDEPENDENT_AMBULATORY_CARE_PROVIDER_SITE_OTHER): Payer: Medicare Other

## 2023-10-17 VITALS — Ht 67.5 in | Wt 238.0 lb

## 2023-10-17 DIAGNOSIS — Z0000 Encounter for general adult medical examination without abnormal findings: Secondary | ICD-10-CM

## 2023-10-17 NOTE — Progress Notes (Signed)
 Subjective:   Kelli Martinez is a 78 y.o. who presents for a Medicare Wellness preventive visit.  Visit Complete: Virtual I connected with  Kelli Martinez on 10/17/23 by a audio enabled telemedicine application and verified that I am speaking with the correct person using two identifiers.  Patient Location: Home  Provider Location: Home Office  I discussed the limitations of evaluation and management by telemedicine. The patient expressed understanding and agreed to proceed.  Vital Signs: Because this visit was a virtual/telehealth visit, some criteria may be missing or patient reported. Any vitals not documented were not able to be obtained and vitals that have been documented are patient reported.  VideoDeclined- This patient declined Librarian, academic. Therefore the visit was completed with audio only.  Persons Participating in Visit: Patient.  AWV Questionnaire: No: Patient Medicare AWV questionnaire was not completed prior to this visit.  Cardiac Risk Factors include: advanced age (>82men, >8 women);diabetes mellitus;hypertension     Objective:    Today's Vitals   10/17/23 1504 10/17/23 1505  Weight: 238 lb (108 kg)   Height: 5' 7.5" (1.715 m)   PainSc:  0-No pain   Body mass index is 36.73 kg/m.     10/17/2023    3:18 PM 04/04/2023    9:43 AM 10/13/2022    4:11 PM 05/29/2022   11:26 AM 03/31/2022   11:12 AM 10/11/2021    8:43 AM 09/27/2021    9:05 AM  Advanced Directives  Does Patient Have a Medical Advance Directive? No No No No No No No  Would patient like information on creating a medical advance directive? No - Patient declined No - Patient declined No - Patient declined No - Patient declined Yes (MAU/Ambulatory/Procedural Areas - Information given)  Yes (MAU/Ambulatory/Procedural Areas - Information given)  Pre-existing out of facility DNR order (yellow form or pink MOST form)  Pink MOST/Yellow Form most recent  copy in chart - Physician notified to receive inpatient order         Current Medications (verified) Outpatient Encounter Medications as of 10/17/2023  Medication Sig   acarbose (PRECOSE) 50 MG tablet Take 50 mg by mouth 3 (three) times daily   albuterol (ACCUNEB) 0.63 MG/3ML nebulizer solution    albuterol (VENTOLIN HFA) 108 (90 Base) MCG/ACT inhaler USE 2 INHALATIONS EVERY 6 HOURS AS NEEDED FOR WHEEZING OR SHORTNESS OF BREATH   baclofen (LIORESAL) 20 MG tablet TAKE 1 TABLET THREE TIMES A DAY (UPDATED PRESCRIPTION)   Blood Glucose Monitoring Suppl (FREESTYLE FREEDOM LITE) w/Device KIT Use as directed to check blood sugar once daily.   Calcium Carb-Cholecalciferol (CALTRATE BONE HEALTH PO) Take 1 tablet by mouth daily.   Calcium Citrate (CALCITRATE PO) Take 1 tablet by mouth daily.   clobetasol cream (TEMOVATE) 0.05 % APPLY 1 APPLICATION TOPICALLY TWICE A DAY   Cyanocobalamin (VITAMIN B-12 PO) Take 1 tablet by mouth every other day.   dorzolamide-timolol (COSOPT) 2-0.5 % ophthalmic solution Place 1 drop into the left eye 2 (two) times daily.   Elastic Bandages & Supports (TRUFORM ARM SLEEVE L 15-20MMHG) MISC 1 Device by Does not apply route as directed.   furosemide (LASIX) 40 MG tablet Take 1 tablet (40 mg total) by mouth 2 (two) times daily.   furosemide (LASIX) 40 MG tablet Take 1 tablet (40 mg total) by mouth 2 (two) times daily.   gabapentin (NEURONTIN) 600 MG tablet TAKE 1 TABLET THREE TIMES A DAY (DOSE CHANGE)   glucose blood (FREESTYLE LITE)  test strip Use as instructed   HYDROcodone-acetaminophen (NORCO) 5-325 MG tablet Take 1 tablet by mouth 2 (two) times daily as needed for moderate pain. (Patient taking differently: Take 0.5 tablets by mouth 2 (two) times daily as needed for severe pain (pain score 7-10).)   Lancets (FREESTYLE) lancets USE AS INSTRUCTED TO CHECK BLOOD SUGAR ONCE DAILY   levothyroxine (SYNTHROID) 112 MCG tablet Take 1 tablet (112 mcg total) by mouth daily.    Magnesium 250 MG TABS Take 250 mg by mouth 2 (two) times daily.   omeprazole (PRILOSEC) 40 MG capsule Take 1 capsule (40 mg total) by mouth daily.   Polyvinyl Alcohol (LIQUID TEARS OP) Place 1 drop into the right eye 2 (two) times daily.   potassium chloride SA (KLOR-CON M) 20 MEQ tablet TAKE 2 TABLETS TWICE A DAY   pravastatin (PRAVACHOL) 10 MG tablet TAKE 1 TABLET DAILY   spironolactone (ALDACTONE) 100 MG tablet Take 100 mg by mouth daily.   temazepam (RESTORIL) 15 MG capsule Take 1 capsule (15 mg total) by mouth at bedtime as needed for sleep.   ursodiol (ACTIGALL) 300 MG capsule TAKE 2 CAPSULES TWICE A DAY   Vitamin D, Ergocalciferol, 50000 units CAPS Take 50,000 Units by mouth every Friday.   warfarin (COUMADIN) 5 MG tablet TAKE 2 TABLETS BY MOUTH DAILY OR AS DIRECTED BY ANTICOAGULATION CLINIC   WIXELA INHUB 100-50 MCG/ACT AEPB USE 1 INHALATION EVERY 12 HOURS (RINSE MOUTH AFTER USING)   No facility-administered encounter medications on file as of 10/17/2023.    Allergies (verified) Adhesive [tape]   History: Past Medical History:  Diagnosis Date   Allergy    Anemia    Arthritis    Asthma    Back pain, chronic    GETS INJECTIONS IN BACK   Blind right eye    hemorrhage   Blood transfusion    Cancer (HCC)    breast 1994   Clotting disorder (HCC)    Diabetes mellitus without complication (HCC)    takes precose   Elevated liver enzymes    Gallstones    GERD (gastroesophageal reflux disease)    HX: breast cancer    Hyperlipidemia    Hypertension    Hypothyroid    Lymphedema of arm    RT   OSA (obstructive sleep apnea)    has not used in 2 years-lost weight   Osteoporosis    Peripheral neuropathy    on gabapentin   PONV (postoperative nausea and vomiting)    Psoriasis    Pulmonary embolism (HCC) 1998 / 1994 /1968   Syncope and collapse 2011   due to anemia   Past Surgical History:  Procedure Laterality Date   ABDOMINAL HYSTERECTOMY     BONE MARROW TRANSPLANT   1994   CARDIOVASCULAR STRESS TEST  05/23/2005   EF 53%   carpal tunnell     bil   cataracts     CHOLECYSTECTOMY  05/10/2012   Procedure: LAPAROSCOPIC CHOLECYSTECTOMY WITH INTRAOPERATIVE CHOLANGIOGRAM;  Surgeon: Valarie Merino, MD;  Location: [facility];  Service: General;  Laterality: N/A;  Laparoscopic Cholecystectomy with Intraoperative Cholangiogram   GASTRIC BYPASS  2011   bariatric surgery   HIATAL HERNIA REPAIR     IVC filter     recurrent DVT   KNEE ARTHROTOMY  1998   MASS EXCISION Right 11/24/2016   Procedure: EXCISION RIGHT AXILLARY LESION AND CHEST WALL LESION;  Surgeon: Luretha Murphy, MD;  Location: [facility];  Service: General;  Laterality: Right;   MASTECTOMY     Right   US ECHOCARDIOGRAPHY  10/27/08   EF 55-60%   Family History  Problem Relation Age of Onset   Sudden death Mother        car accident   Cancer Father 49       lung   Cancer Sister 25       lung cancer   Asthma Daughter    Colon cancer Neg Hx    Stomach cancer Neg Hx    Esophageal cancer Neg Hx    Colon polyps Neg Hx    Social History   Socioeconomic History   Marital status: Widowed    Spouse name: Not on file   Number of children: 2   Years of education: Not on file   Highest education level: Associate degree: occupational, Scientist, product/process development, or vocational program  Occupational History   Occupation: retired  Tobacco Use   Smoking status: Former    Current packs/day: 0.00    Average packs/day: 0.5 packs/day for 10.0 years (5.0 ttl pk-yrs)    Types: Cigarettes    Start date: 07/31/1968    Quit date: 07/31/1978    Years since quitting: 45.2   Smokeless tobacco: Never  Vaping Use   Vaping status: Never Used  Substance and Sexual Activity   Alcohol use: No   Drug use: No   Sexual activity: Not Currently  Other Topics Concern   Not on file  Social History Narrative   Widowed with 2 children,  Lives in a one story home and her son and his wife recently moved in with her.  Retired  Production designer, theatre/television/film for [employer].  Education: some college.    Social Drivers of Corporate investment banker Strain: Low Risk  (10/17/2023)   Overall Financial Resource Strain (CARDIA)    Difficulty of Paying Living Expenses: Not hard at all  Food Insecurity: No Food Insecurity (10/17/2023)   Hunger Vital Sign    Worried About Running Out of Food in the Last Year: Never true    Ran Out of Food in the Last Year: Never true  Transportation Needs: No Transportation Needs (10/17/2023)   PRAPARE - Administrator, Civil Service (Medical): No    Lack of Transportation (Non-Medical): No  Physical Activity: Insufficiently Active (10/17/2023)   Exercise Vital Sign    Days of Exercise per Week: 7 days    Minutes of Exercise per Session: 20 min  Stress: No Stress Concern Present (10/17/2023)   [employer] of Occupational Health - Occupational Stress Questionnaire    Feeling of Stress : Not at all  Social Connections: Moderately Integrated (10/17/2023)   Social Connection and Isolation Panel [NHANES]    Frequency of Communication with Friends and Family: More than three times a week    Frequency of Social Gatherings with Friends and Family: More than three times a week    Attends Religious Services: More than 4 times per year    Active Member of [employer] or Organizations: Yes    Attends Banker Meetings: More than 4 times per year    Marital Status: Widowed    Tobacco Counseling Counseling given: Not Answered    Clinical Intake:  Pre-visit preparation completed: Yes  Pain : No/denies pain Pain Score: 0-No pain     BMI - recorded: 36.73 Nutritional Status: BMI > 30  Obese Nutritional Risks: None Diabetes: Yes CBG done?: Yes CBG resulted in Enter/ Edit results?: Yes (CBG 111  Per patient) Did pt. bring in CBG monitor from home?: No  Lab Results  Component Value Date   HGBA1C 6.9 (A) 07/10/2023   HGBA1C 7.1 (H) 04/05/2023   HGBA1C 7.0 (H) 01/11/2023     How often do  you need to have someone help you when you read instructions, pamphlets, or other written materials from your doctor or pharmacy?: 3 - Sometimes (Daughter assist)  Interpreter Needed?: No  Information entered by :: Theresa Mulligan LPN   Activities of Daily Living     10/17/2023    3:16 PM  In your present state of health, do you have any difficulty performing the following activities:  Hearing? 0  Vision? 0  Difficulty concentrating or making decisions? 0  Walking or climbing stairs? 1  Comment Uses a Cane and a Designer, industrial/product or bathing? 0  Doing errands, shopping? Heritage manager and eating ? N  Using the Toilet? N  In the past six months, have you accidently leaked urine? Y  Comment Wears Breifs. Followed by PCP  Do you have problems with loss of bowel control? N  Managing your Medications? N  Managing your Finances? N  Housekeeping or managing your Housekeeping? N    Patient Care Team: Deeann Saint, MD as PCP - General (Family Medicine) Wyline Mood Alben Spittle, MD as PCP - Cardiology (Cardiology) Glendale Chard, DO as Consulting Physician (Neurology)  Indicate any recent Medical Services you may have received from other than [facility] providers in the past year (date may be approximate).     Assessment:   This is a routine wellness examination for [place].  Hearing/Vision screen Hearing Screening - Comments:: Denies hearing difficulties   Vision Screening - Comments:: Wears rx glasses - up to date with routine eye exams with  Dr Dione Booze   Goals Addressed               This Visit's Progress     Increase physical activity (pt-stated)        Stay active.       Depression Screen     10/17/2023    3:14 PM 07/06/2023    9:49 AM 01/15/2023   10:35 AM 10/13/2022    4:07 PM 10/11/2021    8:48 AM 09/15/2021   10:36 AM 10/05/2020    8:21 AM  PHQ 2/9 Scores  PHQ - 2 Score 0 0 0 0 0 1 1  PHQ- 9 Score  7 0        Fall Risk     10/17/2023    3:18 PM 10/01/2023    7:38  PM 03/29/2023   10:25 AM 10/13/2022    4:09 PM 10/05/2022    2:41 AM  Fall Risk   Falls in the past year? 1 1 1 1 1   Number falls in past yr: 0 1 1 0 1  Injury with Fall? 0 0 1 0 0  Comment    No medical attention required   Risk for fall due to : No Fall Risks   No Fall Risks   Follow up Falls prevention discussed;Falls evaluation completed  Falls evaluation completed Falls prevention discussed     MEDICARE RISK AT HOME:  Medicare Risk at Home Any stairs in or around the home?: No If so, are there any without handrails?: No Home free of loose throw rugs in walkways, pet beds, electrical cords, etc?: Yes Adequate lighting in your home to reduce risk of falls?: Yes Life alert?:  No Use of a cane, walker or w/c?: Yes Grab bars in the bathroom?: No Shower chair or bench in shower?: Yes Elevated toilet seat or a handicapped toilet?: Yes  TIMED UP AND GO:  Was the test performed?  No  Cognitive Function: 6CIT completed        10/17/2023    3:20 PM 10/13/2022    4:11 PM 10/11/2021    8:50 AM 10/05/2020    8:36 AM  6CIT Screen  What Year? 0 points 0 points 0 points 0 points  What month? 0 points 0 points 0 points 0 points  What time? 0 points 0 points 0 points   Count back from 20 0 points 2 points 0 points 0 points  Months in reverse 0 points 0 points 0 points 0 points  Repeat phrase 0 points 0 points 6 points 6 points  Total Score 0 points 2 points 6 points     Immunizations Immunization History  Administered Date(s) Administered   Fluad Quad(high Dose 65+) 03/20/2019, 05/20/2020, 04/21/2021, 05/09/2022   Fluad Trivalent(High Dose 65+) 04/10/2023   Hepb-cpg 07/19/2022, 08/21/2022   Influenza Split 04/23/2012   Influenza Whole 03/31/2009   Influenza, High Dose Seasonal PF 04/07/2015, 04/21/2016, 04/27/2017, 04/23/2018   Influenza,inj,Quad PF,6+ Mos 04/21/2014   PFIZER(Purple Top)SARS-COV-2 Vaccination 09/14/2019, 10/07/2019, 06/25/2020   Pneumococcal Conjugate-13 07/06/2014    Pneumococcal Polysaccharide-23 09/06/1998, 06/19/2019   Zoster Recombinant(Shingrix) 11/30/2020, 02/11/2021    Screening Tests Health Maintenance  Topic Date Due   Colonoscopy  07/31/2013   FOOT EXAM  12/15/2021   COVID-19 Vaccine (4 - 2024-25 season) 04/01/2023   DTaP/Tdap/Td (1 - Tdap) 07/05/2024 (Originally 12/10/1964)   HEMOGLOBIN A1C  01/08/2024   OPHTHALMOLOGY EXAM  02/28/2024   Diabetic kidney evaluation - Urine ACR  06/25/2024   Diabetic kidney evaluation - eGFR measurement  10/01/2024   Medicare Annual Wellness (AWV)  10/16/2024   Pneumonia Vaccine 59+ Years old  Completed   INFLUENZA VACCINE  Completed   DEXA SCAN  Completed   Hepatitis C Screening  Completed   Zoster Vaccines- Shingrix  Completed   HPV VACCINES  Aged Out    Health Maintenance  Health Maintenance Due  Topic Date Due   Colonoscopy  07/31/2013   FOOT EXAM  12/15/2021   COVID-19 Vaccine (4 - 2024-25 season) 04/01/2023   Health Maintenance Items Addressed:    Additional Screening:  Vision Screening: Recommended annual ophthalmology exams for early detection of glaucoma and other disorders of the eye.  Dental Screening: Recommended annual dental exams for proper oral hygiene  Community Resource Referral / Chronic Care Management: CRR required this visit?  No   CCM required this visit?  No     Plan:     I have personally reviewed and noted the following in the patient's chart:   Medical and social history Use of alcohol, tobacco or illicit drugs  Current medications and supplements including opioid prescriptions. Patient is not currently taking opioid prescriptions. Functional ability and status Nutritional status Physical activity Advanced directives List of other physicians Hospitalizations, surgeries, and ER visits in previous 12 months Vitals Screenings to include cognitive, depression, and falls Referrals and appointments  In addition, I have reviewed and discussed with  patient certain preventive protocols, quality metrics, and best practice recommendations. A written personalized care plan for preventive services as well as general preventive health recommendations were provided to patient.     Tillie Rung, LPN   [id number]   After Visit Summary: (MyChart)  Due to this being a telephonic visit, the after visit summary with patients personalized plan was offered to patient via MyChart   Notes: Nothing significant to report at this time.

## 2023-10-17 NOTE — Patient Instructions (Addendum)
 Kelli Martinez , Thank you for taking time to come for your Medicare Wellness Visit. I appreciate your ongoing commitment to your health goals. Please review the following plan we discussed and let me know if I can assist you in the future.   Referrals/Orders/Follow-Ups/Clinician Recommendations:   This is a list of the screening recommended for you and due dates:  Health Maintenance  Topic Date Due   Colon Cancer Screening  07/31/2013   Complete foot exam   12/15/2021   COVID-19 Vaccine (4 - 2024-25 season) 04/01/2023   DTaP/Tdap/Td vaccine (1 - Tdap) 07/05/2024*   Hemoglobin A1C  01/08/2024   Eye exam for diabetics  02/28/2024   Yearly kidney health urinalysis for diabetes  06/25/2024   Yearly kidney function blood test for diabetes  10/01/2024   Medicare Annual Wellness Visit  10/16/2024   Pneumonia Vaccine  Completed   Flu Shot  Completed   DEXA scan (bone density measurement)  Completed   Hepatitis C Screening  Completed   Zoster (Shingles) Vaccine  Completed   HPV Vaccine  Aged Out  *Topic was postponed. The date shown is not the original due date.    Advanced directives: (Provided) Advance directive discussed with you today. I have provided a copy for you to complete at home and have notarized. Once this is complete, please bring a copy in to our office so we can scan it into your chart.   Next Medicare Annual Wellness Visit scheduled for next year: Yes

## 2023-10-19 ENCOUNTER — Telehealth: Admitting: Family Medicine

## 2023-10-19 ENCOUNTER — Telehealth: Payer: Self-pay

## 2023-10-19 ENCOUNTER — Encounter: Payer: Self-pay | Admitting: Family Medicine

## 2023-10-19 ENCOUNTER — Ambulatory Visit: Payer: Medicare Other

## 2023-10-19 VITALS — Ht 67.5 in | Wt 238.0 lb

## 2023-10-19 DIAGNOSIS — K743 Primary biliary cirrhosis: Secondary | ICD-10-CM

## 2023-10-19 DIAGNOSIS — I890 Lymphedema, not elsewhere classified: Secondary | ICD-10-CM | POA: Diagnosis not present

## 2023-10-19 DIAGNOSIS — E1142 Type 2 diabetes mellitus with diabetic polyneuropathy: Secondary | ICD-10-CM

## 2023-10-19 DIAGNOSIS — M25512 Pain in left shoulder: Secondary | ICD-10-CM

## 2023-10-19 DIAGNOSIS — N1832 Chronic kidney disease, stage 3b: Secondary | ICD-10-CM

## 2023-10-19 DIAGNOSIS — G8929 Other chronic pain: Secondary | ICD-10-CM

## 2023-10-19 NOTE — Progress Notes (Signed)
 Virtual Visit via Video Note  I connected with Kelli Martinez on 10/19/23 at  8:30 AM EDT by a video enabled telemedicine application and verified that I am speaking with the correct person using two identifiers.  Location patient: home Location provider:work or home office Persons participating in the virtual visit: patient, provider  I discussed the limitations of evaluation and management by telemedicine and the availability of in person appointments. The patient expressed understanding and agreed to proceed. Chief Complaint  Patient presents with   Medical Management of Chronic Issues    Pt is here for 3 months f/u . Pt would like to know if it is okay for her to take apple cider capsule.     HPI: Patient is a 78 year old female seen for follow-up. Pt taking an ACV tablet with meals which seems to being helping bowels.  Having regular BMs once or twice a day.  Not having to strain.  Pt states her L shoulder is still hurting due to arthritis.  Using voltaren, hydrocodone in the am if hurting BID, heat.  B/ls LEs staying swollen.  Unable to tolerate compression socks.  BS has been good.  Followed by Endo.  BS was 111 last night.  Pt was out of acarbose.  Had a few low bs.  Had a f/u with GI, says liver looked good.  Had yeast in esophagus on EGD, treated with med x 14 days.  Taken off the advair which likely caused the yeast.  Pt gained 5 lbs.  ROS: See pertinent positives and negatives per HPI.  Past Medical History:  Diagnosis Date   Allergy    Anemia    Arthritis    Asthma    Back pain, chronic    GETS INJECTIONS IN BACK   Blind right eye    hemorrhage   Blood transfusion    Cancer (HCC)    breast 1994   Clotting disorder (HCC)    Diabetes mellitus without complication (HCC)    takes precose   Elevated liver enzymes    Gallstones    GERD (gastroesophageal reflux disease)    HX: breast cancer    Hyperlipidemia    Hypertension    Hypothyroid    Lymphedema of arm    RT   OSA  (obstructive sleep apnea)    has not used in 2 years-lost weight   Osteoporosis    Peripheral neuropathy    on gabapentin   PONV (postoperative nausea and vomiting)    Psoriasis    Pulmonary embolism (HCC) 1998 / 1994 /1968   Syncope and collapse 2011   due to anemia    Past Surgical History:  Procedure Laterality Date   ABDOMINAL HYSTERECTOMY     BONE MARROW TRANSPLANT  1994   CARDIOVASCULAR STRESS TEST  05/23/2005   EF 53%   carpal tunnell     bil   cataracts     CHOLECYSTECTOMY  05/10/2012   Procedure: LAPAROSCOPIC CHOLECYSTECTOMY WITH INTRAOPERATIVE CHOLANGIOGRAM;  Surgeon: Valarie Merino, MD;  Location: [facility];  Service: General;  Laterality: N/A;  Laparoscopic Cholecystectomy with Intraoperative Cholangiogram   GASTRIC BYPASS  2011   bariatric surgery   HIATAL HERNIA REPAIR     IVC filter     recurrent DVT   KNEE ARTHROTOMY  1998   MASS EXCISION Right 11/24/2016   Procedure: EXCISION RIGHT AXILLARY LESION AND CHEST WALL LESION;  Surgeon: Luretha Murphy, MD;  Location: [facility];  Service: General;  Laterality: Right;  MASTECTOMY     Right   US ECHOCARDIOGRAPHY  10/27/08   EF 55-60%    Family History  Problem Relation Age of Onset   Sudden death Mother        car accident   Cancer Father 57       lung   Cancer Sister 67       lung cancer   Asthma Daughter    Colon cancer Neg Hx    Stomach cancer Neg Hx    Esophageal cancer Neg Hx    Colon polyps Neg Hx       Current Outpatient Medications:    acarbose (PRECOSE) 50 MG tablet, Take 50 mg by mouth 3 (three) times daily, Disp: 14 tablet, Rfl: 1   albuterol (ACCUNEB) 0.63 MG/3ML nebulizer solution, , Disp: , Rfl:    albuterol (VENTOLIN HFA) 108 (90 Base) MCG/ACT inhaler, USE 2 INHALATIONS EVERY 6 HOURS AS NEEDED FOR WHEEZING OR SHORTNESS OF BREATH, Disp: 25.5 g, Rfl: 3   baclofen (LIORESAL) 20 MG tablet, TAKE 1 TABLET THREE TIMES A DAY (UPDATED PRESCRIPTION), Disp: 270 tablet, Rfl: 3   Blood  Glucose Monitoring Suppl (FREESTYLE FREEDOM LITE) w/Device KIT, Use as directed to check blood sugar once daily., Disp: 1 kit, Rfl: 0   Calcium Carb-Cholecalciferol (CALTRATE BONE HEALTH PO), Take 1 tablet by mouth daily., Disp: , Rfl:    Calcium Citrate (CALCITRATE PO), Take 1 tablet by mouth daily., Disp: , Rfl:    clobetasol cream (TEMOVATE) 0.05 %, APPLY 1 APPLICATION TOPICALLY TWICE A DAY, Disp: 120 g, Rfl: 5   Cyanocobalamin (VITAMIN B-12 PO), Take 1 tablet by mouth every other day., Disp: , Rfl:    dorzolamide-timolol (COSOPT) 2-0.5 % ophthalmic solution, Place 1 drop into the left eye 2 (two) times daily., Disp: , Rfl:    Elastic Bandages & Supports (TRUFORM ARM SLEEVE L 15-20MMHG) MISC, 1 Device by Does not apply route as directed., Disp: 1 each, Rfl: 1   furosemide (LASIX) 40 MG tablet, Take 1 tablet (40 mg total) by mouth 2 (two) times daily., Disp: 180 tablet, Rfl: 3   furosemide (LASIX) 40 MG tablet, Take 1 tablet (40 mg total) by mouth 2 (two) times daily., Disp: 60 tablet, Rfl: 0   gabapentin (NEURONTIN) 600 MG tablet, TAKE 1 TABLET THREE TIMES A DAY (DOSE CHANGE), Disp: 270 tablet, Rfl: 3   glucose blood (FREESTYLE LITE) test strip, Use as instructed, Disp: 100 strip, Rfl: 3   HYDROcodone-acetaminophen (NORCO) 5-325 MG tablet, Take 1 tablet by mouth 2 (two) times daily as needed for moderate pain. (Patient taking differently: Take 0.5 tablets by mouth 2 (two) times daily as needed for severe pain (pain score 7-10).), Disp: 50 tablet, Rfl: 0   Lancets (FREESTYLE) lancets, USE AS INSTRUCTED TO CHECK BLOOD SUGAR ONCE DAILY, Disp: 100 each, Rfl: 1   levothyroxine (SYNTHROID) 112 MCG tablet, Take 1 tablet (112 mcg total) by mouth daily., Disp: 90 tablet, Rfl: 3   Magnesium 250 MG TABS, Take 250 mg by mouth 2 (two) times daily., Disp: , Rfl:    omeprazole (PRILOSEC) 40 MG capsule, Take 1 capsule (40 mg total) by mouth daily., Disp: 180 capsule, Rfl: 1   Polyvinyl Alcohol (LIQUID TEARS OP),  Place 1 drop into the right eye 2 (two) times daily., Disp: , Rfl:    potassium chloride SA (KLOR-CON M) 20 MEQ tablet, TAKE 2 TABLETS TWICE A DAY, Disp: 360 tablet, Rfl: 3   pravastatin (PRAVACHOL) 10 MG tablet,  TAKE 1 TABLET DAILY, Disp: 90 tablet, Rfl: 3   spironolactone (ALDACTONE) 100 MG tablet, Take 100 mg by mouth daily., Disp: , Rfl:    temazepam (RESTORIL) 15 MG capsule, Take 1 capsule (15 mg total) by mouth at bedtime as needed for sleep., Disp: 90 capsule, Rfl: 1   ursodiol (ACTIGALL) 300 MG capsule, TAKE 2 CAPSULES TWICE A DAY, Disp: 360 capsule, Rfl: 3   Vitamin D, Ergocalciferol, 50000 units CAPS, Take 50,000 Units by mouth every Friday., Disp: 12 capsule, Rfl: 3   warfarin (COUMADIN) 5 MG tablet, TAKE 2 TABLETS BY MOUTH DAILY OR AS DIRECTED BY ANTICOAGULATION CLINIC, Disp: 205 tablet, Rfl: 3   WIXELA INHUB 100-50 MCG/ACT AEPB, USE 1 INHALATION EVERY 12 HOURS (RINSE MOUTH AFTER USING), Disp: 180 each, Rfl: 3  EXAM:  VITALS per patient if applicable: RR between 12-20 bpm  GENERAL: alert, oriented, appears well and in no acute distress  HEENT: atraumatic, conjunctiva clear, no obvious abnormalities on inspection of external nose and ears  NECK: normal movements of the head and neck  LUNGS: on inspection no signs of respiratory distress, breathing rate appears normal, no obvious gross SOB, gasping or wheezing  CV: no obvious cyanosis  MS: moves all visible extremities without noticeable abnormality  PSYCH/NEURO: pleasant and cooperative, no obvious depression or anxiety, speech and thought processing grossly intact  ASSESSMENT AND PLAN:  Discussed the following assessment and plan:  Chronic left shoulder pain  Type 2 diabetes mellitus with diabetic polyneuropathy, without long-term current use of insulin (HCC)  Lymphedema  Chronic kidney disease, stage 3b (HCC)  Primary biliary cholangitis (HCC)  Patient seen for follow-up for chronic conditions.  Stable overall.   Continue supportive care for chronic left shoulder pain.  Wishes to avoid surgery.  Continue follow-up with Ortho.  DM controlled, hemoglobin A1c 6.9% on 07/10/2023.  Continue current medications.  Continue follow-up with Endo.  Continue supportive care for lymphedema.  Consider using Ace bandages if unable to tolerate compression socks/TED hose.  Continue follow-up with nephrology and GI.   I discussed the assessment and treatment plan with the patient. The patient was provided an opportunity to ask questions and all were answered. The patient agreed with the plan and demonstrated an understanding of the instructions.   The patient was advised to call back or seek an in-person evaluation if the symptoms worsen or if the condition fails to improve as anticipated.   Deeann Saint, MD

## 2023-10-19 NOTE — Telephone Encounter (Signed)
 Pt LVM last night that she could not make her apt today and would like a call back to RS. Cancelled apt.

## 2023-10-23 ENCOUNTER — Ambulatory Visit (INDEPENDENT_AMBULATORY_CARE_PROVIDER_SITE_OTHER)

## 2023-10-23 DIAGNOSIS — Z7901 Long term (current) use of anticoagulants: Secondary | ICD-10-CM | POA: Diagnosis not present

## 2023-10-23 LAB — POCT INR: INR: 1.6 — AB (ref 2.0–3.0)

## 2023-10-23 NOTE — Patient Instructions (Addendum)
 Pre visit review using our clinic review tool, if applicable. No additional management support is needed unless otherwise documented below in the visit note.  Increase dose today to take 2 tablets and increase dose tomorrow to take 3 tablets and then change weekly dose to take 2 tablets daily except take 1 1/2 tablets on Tuesday, Thursday and Saturday. Recheck in 4 weeks.

## 2023-10-23 NOTE — Progress Notes (Signed)
 Pt reports she has been eating a lot of vitamin K containing foods. Advised these will decrease INR. Advised to continue consistency in eating these foods and a dose change will be made to cover for the increase. Pt verbalized understanding. Pt has hx of being subtherapeutic when she is indulging in more vitamin K foods than she normally does. She really enjoys these foods and not being consistent causes her INR in the past to be very labile.  Pt was stable on new weekly dose before she was prescribed fluconazole at the end of March.  Increase dose today to take 2 tablets and increase dose tomorrow to take 3 tablets and then change weekly dose to take 2 tablets daily except take 1 1/2 tablets on Tuesday, Thursday and Saturday. Recheck in 4 weeks.

## 2023-10-24 ENCOUNTER — Other Ambulatory Visit: Payer: Self-pay

## 2023-10-24 DIAGNOSIS — E1142 Type 2 diabetes mellitus with diabetic polyneuropathy: Secondary | ICD-10-CM

## 2023-10-24 MED ORDER — ACARBOSE 50 MG PO TABS: ORAL_TABLET | ORAL | 0 refills | Status: DC

## 2023-10-24 NOTE — Telephone Encounter (Signed)
 Requested Prescriptions   Pending Prescriptions Disp Refills   acarbose (PRECOSE) 50 MG tablet 30 tablet 0    Sig: Take 50 mg by mouth 3 (three) times daily

## 2023-11-07 ENCOUNTER — Other Ambulatory Visit: Payer: Self-pay

## 2023-11-07 DIAGNOSIS — E1142 Type 2 diabetes mellitus with diabetic polyneuropathy: Secondary | ICD-10-CM

## 2023-11-07 MED ORDER — ACARBOSE 50 MG PO TABS: ORAL_TABLET | ORAL | 0 refills | Status: DC

## 2023-11-08 ENCOUNTER — Other Ambulatory Visit: Payer: Self-pay | Admitting: Endocrinology

## 2023-11-08 DIAGNOSIS — E1142 Type 2 diabetes mellitus with diabetic polyneuropathy: Secondary | ICD-10-CM

## 2023-11-09 ENCOUNTER — Ambulatory Visit: Payer: Medicare Other | Admitting: Internal Medicine

## 2023-11-09 ENCOUNTER — Other Ambulatory Visit (INDEPENDENT_AMBULATORY_CARE_PROVIDER_SITE_OTHER)

## 2023-11-09 ENCOUNTER — Encounter: Payer: Self-pay | Admitting: Internal Medicine

## 2023-11-09 VITALS — BP 122/70 | HR 100 | Ht 67.5 in | Wt 242.0 lb

## 2023-11-09 DIAGNOSIS — K317 Polyp of stomach and duodenum: Secondary | ICD-10-CM

## 2023-11-09 DIAGNOSIS — K219 Gastro-esophageal reflux disease without esophagitis: Secondary | ICD-10-CM

## 2023-11-09 DIAGNOSIS — K743 Primary biliary cirrhosis: Secondary | ICD-10-CM

## 2023-11-09 DIAGNOSIS — R7989 Other specified abnormal findings of blood chemistry: Secondary | ICD-10-CM | POA: Diagnosis not present

## 2023-11-09 DIAGNOSIS — Z9884 Bariatric surgery status: Secondary | ICD-10-CM

## 2023-11-09 DIAGNOSIS — Z8719 Personal history of other diseases of the digestive system: Secondary | ICD-10-CM

## 2023-11-09 LAB — FOLATE: Folate: 9.3 ng/mL (ref >5.9)

## 2023-11-09 MED ORDER — URSODIOL 300 MG PO CAPS: 600.0000 mg | ORAL_CAPSULE | Freq: Two times a day (BID) | ORAL | 3 refills | Status: DC

## 2023-11-09 NOTE — Progress Notes (Signed)
 Chief Complaint: Hypoglycemia after gastric bypass, PBC  HPI : 78 year old female with history of asthma, DM, GERD, hypothyroidism, obesity s/p RYGB in 2011, DM, OSA, PE/DVT on warfarin, breast cancer presents for follow up of PBC  Interval History: Patient's main complaint today is that her legs have been swelling more recently.  She has gained several pounds as a result of fluid accumulation.  She is urinating regularly, and her diuretics are currently being managed by her PCP Dr. Arliss Lam.  She has been trying to follow a low-sodium diet to help with her fluid accumulation.  She is not able to tolerate wearing compression stockings due to how large her legs are.  She has been taking her ursodiol as prescribed.  Patient feels that her acid reflux is currently under good control.  She also feels like her constipation has been doing better.  She has been taking apple cider vinegar, which seems to be helping with her constipation. Denies blood in the stools or confusion.  She was told that she no longer needed hematology follow-up since her blood counts and iron studies are overall looking better.  Wt Readings from Last 3 Encounters:  11/09/23 242 lb (109.8 kg)  10/19/23 238 lb (108 kg)  10/17/23 238 lb (108 kg)   Current Outpatient Medications  Medication Sig Dispense Refill   acarbose (PRECOSE) 50 MG tablet TAKE 1 TABLET THREE TIMES A DAY 90 tablet 11   albuterol (ACCUNEB) 0.63 MG/3ML nebulizer solution      albuterol (VENTOLIN HFA) 108 (90 Base) MCG/ACT inhaler USE 2 INHALATIONS EVERY 6 HOURS AS NEEDED FOR WHEEZING OR SHORTNESS OF BREATH 25.5 g 3   baclofen (LIORESAL) 20 MG tablet TAKE 1 TABLET THREE TIMES A DAY (UPDATED PRESCRIPTION) 270 tablet 3   Blood Glucose Monitoring Suppl (FREESTYLE FREEDOM LITE) w/Device KIT Use as directed to check blood sugar once daily. 1 kit 0   Calcium Carb-Cholecalciferol (CALTRATE BONE HEALTH PO) Take 1 tablet by mouth daily.     Calcium Citrate (CALCITRATE PO)  Take 1 tablet by mouth daily.     clobetasol cream (TEMOVATE) 0.05 % APPLY 1 APPLICATION TOPICALLY TWICE A DAY 120 g 5   Cyanocobalamin (VITAMIN B-12 PO) Take 1 tablet by mouth every other day.     dorzolamide-timolol (COSOPT) 2-0.5 % ophthalmic solution Place 1 drop into the left eye 2 (two) times daily.     Elastic Bandages & Supports (TRUFORM ARM SLEEVE L 15-20MMHG) MISC 1 Device by Does not apply route as directed. 1 each 1   furosemide (LASIX) 40 MG tablet Take 1 tablet (40 mg total) by mouth 2 (two) times daily. 180 tablet 3   gabapentin (NEURONTIN) 600 MG tablet TAKE 1 TABLET THREE TIMES A DAY (DOSE CHANGE) 270 tablet 3   glucose blood (FREESTYLE LITE) test strip Use as instructed 100 strip 3   HYDROcodone-acetaminophen (NORCO) 5-325 MG tablet Take 1 tablet by mouth 2 (two) times daily as needed for moderate pain. (Patient taking differently: Take 0.5 tablets by mouth 2 (two) times daily as needed for severe pain (pain score 7-10).) 50 tablet 0   Lancets (FREESTYLE) lancets USE AS INSTRUCTED TO CHECK BLOOD SUGAR ONCE DAILY 100 each 1   levothyroxine (SYNTHROID) 112 MCG tablet Take 1 tablet (112 mcg total) by mouth daily. 90 tablet 3   Magnesium 250 MG TABS Take 250 mg by mouth 2 (two) times daily.     omeprazole (PRILOSEC) 40 MG capsule Take 1 capsule (40 mg  total) by mouth daily. 180 capsule 1   Polyvinyl Alcohol (LIQUID TEARS OP) Place 1 drop into the right eye 2 (two) times daily.     potassium chloride SA (KLOR-CON M) 20 MEQ tablet TAKE 2 TABLETS TWICE A DAY 360 tablet 3   pravastatin (PRAVACHOL) 10 MG tablet TAKE 1 TABLET DAILY 90 tablet 3   temazepam (RESTORIL) 15 MG capsule Take 1 capsule (15 mg total) by mouth at bedtime as needed for sleep. 90 capsule 1   ursodiol (ACTIGALL) 300 MG capsule TAKE 2 CAPSULES TWICE A DAY 360 capsule 3   Vitamin D, Ergocalciferol, 50000 units CAPS Take 50,000 Units by mouth every Friday. 12 capsule 3   warfarin (COUMADIN) 5 MG tablet TAKE 2 TABLETS BY  MOUTH DAILY OR AS DIRECTED BY ANTICOAGULATION CLINIC 205 tablet 3   WIXELA INHUB 100-50 MCG/ACT AEPB USE 1 INHALATION EVERY 12 HOURS (RINSE MOUTH AFTER USING) 180 each 3   furosemide (LASIX) 40 MG tablet Take 1 tablet (40 mg total) by mouth 2 (two) times daily. (Patient not taking: Reported on 11/09/2023) 60 tablet 0   spironolactone (ALDACTONE) 100 MG tablet Take 100 mg by mouth daily.     No current facility-administered medications for this visit.   Physical Exam: BP 122/70   Pulse 100   Ht 5' 7.5" (1.715 m)   Wt 242 lb (109.8 kg)   BMI 37.34 kg/m  Constitutional: Pleasant,well-developed, female in no acute distress. HEENT: Normocephalic and atraumatic. Conjunctivae are normal. No scleral icterus. Cardiovascular: Normal rate, regular rhythm.  Pulmonary/chest: Effort normal and breath sounds normal. No wheezing, rales or rhonchi. Abdominal: Soft, nondistended, nontender. Bowel sounds active throughout. There are no masses palpable. No hepatomegaly. Extremities: 3+ BLE edema Neurological: Alert and oriented to person place and time. Skin: Skin is warm and dry. No rashes noted. Psychiatric: Normal mood and affect. Behavior is normal.  Labs 06/2021: CBC nml  Labs 10/2021: Negative Cologuard  Labs 11/2021: HbA1C 6.9%.   Labs 12/2021: CMP with elevated Cr of 1.26 and BUN of 36. AST and ALT are elevated to 69 and 52, respectively. Alk phos and T bili are normal. TSH nml.   Labs 01/2022: CBC with mildly low Hb of 11.7. ANA positive at 1:320. CBC low at 11.7. INR elevated at 2.2. IgG nml. ASMA negative. AMA positive for 101.3. Hep B surface antibody NR. Hep B surface antigen NR. Hep C antibody NR. Hep A antibody positive. IBC ferritin with mildly low iron sat of 11.7%  Labs 04/2022: CBC with low Hb of 11.2. CMP with elevated Cr of 1.02. INR nml.   Labs 04/2023 CBC with mildly low Hb of 12.4.   CT A/P w/contrast 11/05/14: IMPRESSION: Status post right mastectomy with right axillary lymph  node dissection. Radiation changes in the right upper lobe. No evidence of recurrent or metastatic disease. Postsurgical changes related to gastric bypass, cholecystectomy, and hysterectomy. IVC filter. Additional ancillary findings as above.  CT C/A/P w/contrast 01/04/23: IMPRESSION: 1. No evidence of acute fracture or solid organ injury. 2. Moderate amount of retained stool in the colon suggesting constipation. 3. Small hiatal hernia. 4. Aortic atherosclerosis and coronary artery calcifications.  TTE 08/25/21: IMPRESSIONS   1. Left ventricular ejection fraction, by estimation, is 55 to 60%. The left ventricle has normal function. The left ventricle has no regional wall motion abnormalities. Left ventricular diastolic parameters were normal. The average left ventricular global longitudinal strain is -22.9 %. The global longitudinal strain is normal.   2.  Right ventricular systolic function is normal. The right ventricular size is normal. There is normal pulmonary artery systolic pressure. The estimated right ventricular systolic pressure is 29.2 mmHg.   3. The mitral valve is abnormal. Mild mitral valve regurgitation. Moderate mitral annular calcification.   4. The aortic valve is tricuspid. There is moderate calcification of the aortic valve. There is moderate thickening of the aortic valve. Aortic valve regurgitation is not visualized. Aortic valve sclerosis/calcification is present, without any evidence of aortic stenosis.   5. The inferior vena cava is normal in size with <50% respiratory variability, suggesting right atrial pressure of 8 mmHg.   RUQ U/S 02/23/22: IMPRESSION: 1. Increased echogenicity is identified in the liver. This is a nonspecific finding but often seen with hepatic steatosis. Nonspecific causes of hepatocellular disease may result in the same findings. 2. No other abnormalities.  Liver biopsy 05/29/22: A. LIVER, BIOPSY: - Patchy portal-based inflammation and  minimal lobular inflammation; see comment - Mild fibrosis (stage 1 of 4) COMMENT: Biopsy shows liver parenchyma with preserved architecture. A mononuclear cell infiltrate, moderate focally marked in degree, expands some of the portal tracts in a patchy manner.  This infiltrate is composed principally of small lymphocytes, plasma cells and eosinophils. There is associated bile ductular injury with lymphocytic cholangitis and focal 'florid duct lesion'.  Hepatic lobules show rare foci of necroinflammation and isolated apoptotic hepatocytes.  Trichrome stain shows patchy portal fibrous expansion.  Reticulin stain highlights thenormal trabecular architecture.  PASD stain shows no globular inclusions within hepatocytes.  Iron stain shows minimal granular iron deposition within hepatocytes and moderate staining within histiocytes and Kupffer cells. The findings are compatible with primary biliary cholangitis (PBC), especially with patient's clinical finding of positive M2 anti-mitochondrial antibody test.  Histologic features do not support overlapping autoimmune hepatitis.  Clinical and serological correlation is suggested.   EGD 03/03/22: - Normal esophagus. - Small hiatal hernia. - Gastritis. Biopsied. - Two gastric polyps. Resected and retrieved. - Roux-en-Y gastrojejunostomy with gastrojejunal anastomosis characterized by healthy appearing mucosa. - Normal examined jejunum. Biopsied. Path: 1. Surgical [P], 2nd portion of duodenum - DUODENAL MUCOSA WITH NO SPECIFIC HISTOPATHOLOGIC CHANGES - NEGATIVE FOR INCREASED INTRAEPITHELIAL LYMPHOCYTES OR VILLOUS ARCHITECTURAL CHANGES 2. Surgical [P], gastric antrum and gastric body - GASTRIC ANTRAL AND OXYNTIC MUCOSA SHOWING REACTIVE GASTROPATHY WITH SUBEPITHELIAL CRYSTALLINE DEPOSITS, CONSISTENT WITH IRON PILL GASTROPATHY - HELICOBACTER PYLORI-LIKE ORGANISMS ARE NOT IDENTIFIED ON ROUTINE H&E STAIN 3. Surgical [P], gastric body, polyp (2) - GASTRIC  HYPERPLASTIC POLYP(S) - NEGATIVE FOR INTESTINAL METAPLASIA OR DYSPLASIA  EGD 05/23/23: - White nummular lesions in esophageal mucosa. Biopsied. - A single gastric polyp. Resected and retrieved. - Roux- en- Y gastrojejunostomy with gastrojejunal anastomosis characterized by healthy appearing mucosa. - Normal examined jejunum.      1. Surgical [P], gastric polyp, polyp (1) :       - POLYPOID FRAGMENT OF GASTRIC OXYNTIC MUCOSA WITH REACTIVE CHANGES       - NEGATIVE FOR INTESTINAL METAPLASIA, DYSPLASIA OR MALIGNANCY       2. Surgical [P], esophagus :       - CANDIDA ESOPHAGITIS       - NO DYSPLASIA OR MALIGNANCY IDENTIFIED   ASSESSMENT AND PLAN: Primary biliary cholangitis Postprandial hypoglycemia S/p RYGB History of iron pill gastritis Gastric hyperplastic polyps GERD Elevated LFTs - improved Patient presents primarily to discuss worsened swelling in her legs.  Unclear what is driving this lower extremity swelling.  Patient has come in compliant with her ursodiol for  treatment of PBC, and her last liver biopsy 2 years ago only showed mild fibrosis (stage I out of IV).  Thus I would be very surprised if she has now progressed to cirrhosis.  In addition her platelet count and LFTs remain normal, suggesting against significant portal hypertension.  Her INR is chronically elevated as a result of warfarin use.  Albumin remains normal as well.  Will plan to reassess her liver fibrosis by performing abdominal ultrasound with elastography.  Patient may need to reach out to her PCP to potentially get a repeat TTE and/or lower extremity venous study since patient does have history of venous reflux in her bilateral lower extremities, which may have worsened over time.  - Continue ursodiol 600 mg BID. Refill - Check vitamin A, folate - Continue PPI every day. Refill - Ab U/S with elastography - Previously counseled on avoiding simple sugars and limiting carbohydrate intake to less than 30 grams per meal  due to issues with hypoglycemia. Can try to eat smaller meals and aim to eat more protein.  - Recommended that the patient reach to to her PCP to consider repeat TTE and/or bilateral lower extremity venous reflux ultrasound due to worsened swelling in the patient's lower extremities - RTC 6 months  Regino Caprio, MD  I spent 40 minutes of time, including in depth chart review, independent review of results as outlined above, communicating results with the patient directly, face-to-face time with the patient, coordinating care, ordering studies and medications as appropriate, and documentation.

## 2023-11-09 NOTE — Patient Instructions (Signed)
 Your provider has requested that you go to the basement level for lab work before leaving today. Press "B" on the elevator. The lab is located at the first door on the left as you exit the elevator.   You have been scheduled for an abdominal ultrasound with elastography at [facility] (1st floor). Your appointment is scheduled for 11/16/23 at 9:30 am. Please arrive 30 minutes prior to your scheduled appointment for registration purposes. Make certain not to have anything to eat or drink after midnight prior to your procedure. Should you need to reschedule your appointment, you may contact radiology at [phone].  Liver Elastography Various chronic liver diseases such as hepatitis B, C, and fatty liver disease can lead to tissue damage and subsequent scar tissue formation. As the scar tissue accumulates, the liver loses some of its elasticity and becomes stiffer. Liver elastography involves the use of a surface ultrasound probe that delivers a low frequency pulse or shear wave to a small volume of liver tissue under the rib cage. The transmission of the sound wave is completely painless. How Is a Liver Elastography Performed? The liver is located in the right upper abdomen under the rib cage. Patients are asked to lie flat on an examination table. A technician places the FibroScan probe between the ribs on the right side of the lower chest wall. A series of 10 painless pulses are then applied to the liver. The results are recorded on the equipment and an overall liver stiffness score is generated. This score is then interpreted by a qualified physician to predict the likelihood of advanced fibrosis or cirrhosis.  Patients are asked to wear loose clothing and should not consume any liquids or solids for a minimum of 4 hours before the test to increase the likelihood of obtaining reliable test results. The scan will take 10 to 15 minutes to complete, but patients should plan on being available for  30 minutes to allow time for preparation   _______________________________________________________  If your blood pressure at your visit was 140/90 or greater, please contact your primary care physician to follow up on this.  _______________________________________________________  If you are age 54 or older, your body mass index should be between 23-30. Your Body mass index is 37.34 kg/m. If this is out of the aforementioned range listed, please consider follow up with your Primary Care Provider.  If you are age 93 or younger, your body mass index should be between 19-25. Your Body mass index is 37.34 kg/m. If this is out of the aformentioned range listed, please consider follow up with your Primary Care Provider.   ________________________________________________________  The [place] [facility] providers would like to encourage you to use [facility] to communicate with providers for non-urgent requests or questions.  Due to long hold times on the telephone, sending your provider a message by [facility] may be a faster and more efficient way to get a response.  Please allow 48 business hours for a response.  Please remember that this is for non-urgent requests.  _______________________________________________________

## 2023-11-12 ENCOUNTER — Encounter: Payer: Self-pay | Admitting: Endocrinology

## 2023-11-12 ENCOUNTER — Ambulatory Visit (INDEPENDENT_AMBULATORY_CARE_PROVIDER_SITE_OTHER): Payer: Medicare Other | Admitting: Endocrinology

## 2023-11-12 ENCOUNTER — Other Ambulatory Visit: Payer: Self-pay | Admitting: Internal Medicine

## 2023-11-12 VITALS — BP 110/60 | HR 57 | Resp 20 | Ht 67.5 in | Wt 238.8 lb

## 2023-11-12 DIAGNOSIS — K912 Postsurgical malabsorption, not elsewhere classified: Secondary | ICD-10-CM

## 2023-11-12 DIAGNOSIS — E063 Autoimmune thyroiditis: Secondary | ICD-10-CM | POA: Diagnosis not present

## 2023-11-12 DIAGNOSIS — Z7984 Long term (current) use of oral hypoglycemic drugs: Secondary | ICD-10-CM | POA: Diagnosis not present

## 2023-11-12 DIAGNOSIS — Z9889 Other specified postprocedural states: Secondary | ICD-10-CM | POA: Diagnosis not present

## 2023-11-12 DIAGNOSIS — E1142 Type 2 diabetes mellitus with diabetic polyneuropathy: Secondary | ICD-10-CM

## 2023-11-12 LAB — POCT GLYCOSYLATED HEMOGLOBIN (HGB A1C): Hemoglobin A1C: 6.5 % — AB (ref 4.0–5.6)

## 2023-11-12 MED ORDER — ACARBOSE 50 MG PO TABS: 50.0000 mg | ORAL_TABLET | Freq: Three times a day (TID) | ORAL | 3 refills | Status: DC

## 2023-11-12 MED ORDER — ACARBOSE 50 MG PO TABS
50.0000 mg | ORAL_TABLET | Freq: Three times a day (TID) | ORAL | 3 refills | Status: AC
Start: 2023-11-12 — End: ?

## 2023-11-12 NOTE — Progress Notes (Signed)
 Outpatient Endocrinology Note [place] Hartlee Amedee, MD  11/12/23  Patient's Name: Kelli Martinez    DOB: [birth year]    MRN: [id number]                                                    REASON OF VISIT: Follow up for type 2 diabetes mellitus / hypoglycemia  PCP: Deeann Saint, MD  HISTORY OF PRESENT ILLNESS:   Kelli Martinez is a 78 y.o. old female with past medical history listed below, is here for follow up of type 2 diabetes mellitus and hypoglycemia / hypothyroidism.   Pertinent Diabetes History: Patient was diagnosed with type 2 diabetes mellitus several years ago, has mild and well-controlled since her gastric bypass surgery in 2011.  She has postprandial hypoglycemia related with dumping syndrome.  Chronic Diabetes Complications : Retinopathy: no. Last ophthalmology exam was done on annually reportedly. Nephropathy: CKD IIIb, was referred to nephrology. Peripheral neuropathy: yes, on burning, numbness, on gabapentin and nortriptyline, following with podiatry. Coronary artery disease: no Stroke: no  Relevant comorbidities and cardiovascular risk factors: Obesity: yes Body mass index is 36.85 kg/m.  Hypertension: yes Hyperlipidemia. Yes, on statin.   Current / Home Diabetic regimen includes: Acarbose 50 mg 3 times a day with meals.  Prior diabetic medications: Metformin stopped due to tendency of hypoglycemia.  Glycemic data:   Foot to bring glucometer today. She recall glucose was 123 yesterday. Denies hypoglycemia.  Hypoglycemia: Patient has no hypoglycemic episodes. Patient has hypoglycemia awareness.  Factors modifying glucose control: 1.  Diabetic diet assessment: 3 meals a day.  2.  Staying active or exercising: not able to do exercise.  3.  Medication compliance: compliant all of the time.  # Postprandial hypoglycemia related to dumping syndrome. She had gastric bypass surgery in 2011. -She uses glucose tablet or juice to correct  hypoglycemia and hypoglycemic symptoms.  Usually 1 to 2 tablets are educated. -She had seen dietitian instructed for mixed meal diet with low-fat meals enough protein consistently and restricting carbohydrates including fluids. -She did try verapamil previously without benefit. -She is on acarbose for hypoglycemia as well.  # Primary hypothyroidism for several years. -On Thyroid hormone replacement/levothyroxine 112 mcg daily.    Interval history  Forgot to bring glucometer today.  She reports she has been having no hypoglycemia.  Few weeks ago she ran out of the acarbose and was having hypoglycemia at that time.  Hemoglobin A1c today 6.5%.  Denies any complaints.  She reports he has been following with podiatry every 3 months.  Taking levothyroxine, denies any hypo and hyperthyroid symptoms.  No other complaints today.    REVIEW OF SYSTEMS As per history of present illness.   PAST MEDICAL HISTORY: Past Medical History:  Diagnosis Date   Allergy    Anemia    Arthritis    Asthma    Back pain, chronic    GETS INJECTIONS IN BACK   Blind right eye    hemorrhage   Blood transfusion    Cancer (HCC)    breast 1994   Clotting disorder (HCC)    Diabetes mellitus without complication (HCC)    takes precose   Elevated liver enzymes    Gallstones    GERD (gastroesophageal reflux disease)    HX: breast cancer    Hyperlipidemia  Hypertension    Hypothyroid    Lymphedema of arm    RT   OSA (obstructive sleep apnea)    has not used in 2 years-lost weight   Osteoporosis    Peripheral neuropathy    on gabapentin   PONV (postoperative nausea and vomiting)    Psoriasis    Pulmonary embolism (HCC) 1998 / 1994 /1968   Syncope and collapse 2011   due to anemia    PAST SURGICAL HISTORY: Past Surgical History:  Procedure Laterality Date   ABDOMINAL HYSTERECTOMY     BONE MARROW TRANSPLANT  1994   CARDIOVASCULAR STRESS TEST  05/23/2005   EF 53%   carpal tunnell     bil   cataracts      CHOLECYSTECTOMY  05/10/2012   Procedure: LAPAROSCOPIC CHOLECYSTECTOMY WITH INTRAOPERATIVE CHOLANGIOGRAM;  Surgeon: Azucena Bollard, MD;  Location: [facility];  Service: General;  Laterality: N/A;  Laparoscopic Cholecystectomy with Intraoperative Cholangiogram   GASTRIC BYPASS  2011   bariatric surgery   HIATAL HERNIA REPAIR     IVC filter     recurrent DVT   KNEE ARTHROTOMY  1998   MASS EXCISION Right 11/24/2016   Procedure: EXCISION RIGHT AXILLARY LESION AND CHEST WALL LESION;  Surgeon: Jacolyn Matar, MD;  Location: [facility];  Service: General;  Laterality: Right;   MASTECTOMY     Right   US  ECHOCARDIOGRAPHY  10/27/08   EF 55-60%    ALLERGIES: Allergies  Allergen Reactions   Adhesive [Tape] Rash    FAMILY HISTORY:  Family History  Problem Relation Age of Onset   Sudden death Mother        car accident   Cancer Father 41       lung   Cancer Sister 26       lung cancer   Asthma Daughter    Colon cancer Neg Hx    Stomach cancer Neg Hx    Esophageal cancer Neg Hx    Colon polyps Neg Hx     SOCIAL HISTORY: Social History   Socioeconomic History   Marital status: Widowed    Spouse name: Not on file   Number of children: 2   Years of education: Not on file   Highest education level: Associate degree: occupational, Scientist, product/process development, or vocational program  Occupational History   Occupation: retired  Tobacco Use   Smoking status: Former    Current packs/day: 0.00    Average packs/day: 0.5 packs/day for 10.0 years (5.0 ttl pk-yrs)    Types: Cigarettes    Start date: 07/31/1968    Quit date: 07/31/1978    Years since quitting: 45.3   Smokeless tobacco: Never  Vaping Use   Vaping status: Never Used  Substance and Sexual Activity   Alcohol use: No   Drug use: No   Sexual activity: Not Currently  Other Topics Concern   Not on file  Social History Narrative   Widowed with 2 children,  Lives in a one story home and her son and his wife recently moved in with  her.  Retired Production designer, theatre/television/film for [employer].  Education: some college.    Social Drivers of Corporate investment banker Strain: Low Risk  (10/17/2023)   Overall Financial Resource Strain (CARDIA)    Difficulty of Paying Living Expenses: Not hard at all  Food Insecurity: No Food Insecurity (10/17/2023)   Hunger Vital Sign    Worried About Running Out of Food in the Last Year: Never true  Ran Out of Food in the Last Year: Never true  Transportation Needs: No Transportation Needs (10/17/2023)   PRAPARE - Administrator, Civil Service (Medical): No    Lack of Transportation (Non-Medical): No  Physical Activity: Insufficiently Active (10/17/2023)   Exercise Vital Sign    Days of Exercise per Week: 7 days    Minutes of Exercise per Session: 20 min  Stress: No Stress Concern Present (10/17/2023)   [employer] of Occupational Health - Occupational Stress Questionnaire    Feeling of Stress : Not at all  Social Connections: Moderately Integrated (10/17/2023)   Social Connection and Isolation Panel [NHANES]    Frequency of Communication with Friends and Family: More than three times a week    Frequency of Social Gatherings with Friends and Family: More than three times a week    Attends Religious Services: More than 4 times per year    Active Member of [employer] or Organizations: Yes    Attends Banker Meetings: More than 4 times per year    Marital Status: Widowed    MEDICATIONS:  Current Outpatient Medications  Medication Sig Dispense Refill   albuterol (ACCUNEB) 0.63 MG/3ML nebulizer solution      albuterol (VENTOLIN HFA) 108 (90 Base) MCG/ACT inhaler USE 2 INHALATIONS EVERY 6 HOURS AS NEEDED FOR WHEEZING OR SHORTNESS OF BREATH 25.5 g 3   baclofen (LIORESAL) 20 MG tablet TAKE 1 TABLET THREE TIMES A DAY (UPDATED PRESCRIPTION) 270 tablet 3   Blood Glucose Monitoring Suppl (FREESTYLE FREEDOM LITE) w/Device KIT Use as directed to check blood sugar once daily. 1 kit 0   Calcium  Carb-Cholecalciferol (CALTRATE BONE HEALTH PO) Take 1 tablet by mouth daily.     Calcium Citrate (CALCITRATE PO) Take 1 tablet by mouth daily.     clobetasol cream (TEMOVATE) 0.05 % APPLY 1 APPLICATION TOPICALLY TWICE A DAY 120 g 5   Cyanocobalamin (VITAMIN B-12 PO) Take 1 tablet by mouth every other day.     dorzolamide-timolol (COSOPT) 2-0.5 % ophthalmic solution Place 1 drop into the left eye 2 (two) times daily.     Elastic Bandages & Supports (TRUFORM ARM SLEEVE L 15-20MMHG) MISC 1 Device by Does not apply route as directed. 1 each 1   furosemide (LASIX) 40 MG tablet Take 1 tablet (40 mg total) by mouth 2 (two) times daily. 180 tablet 3   furosemide (LASIX) 40 MG tablet Take 1 tablet (40 mg total) by mouth 2 (two) times daily. 60 tablet 0   gabapentin (NEURONTIN) 600 MG tablet TAKE 1 TABLET THREE TIMES A DAY (DOSE CHANGE) 270 tablet 3   glucose blood (FREESTYLE LITE) test strip Use as instructed 100 strip 3   HYDROcodone-acetaminophen (NORCO) 5-325 MG tablet Take 1 tablet by mouth 2 (two) times daily as needed for moderate pain. (Patient taking differently: Take 0.5 tablets by mouth 2 (two) times daily as needed for severe pain (pain score 7-10).) 50 tablet 0   Lancets (FREESTYLE) lancets USE AS INSTRUCTED TO CHECK BLOOD SUGAR ONCE DAILY 100 each 1   levothyroxine (SYNTHROID) 112 MCG tablet Take 1 tablet (112 mcg total) by mouth daily. 90 tablet 3   Magnesium 250 MG TABS Take 250 mg by mouth 2 (two) times daily.     omeprazole (PRILOSEC) 40 MG capsule Take 1 capsule (40 mg total) by mouth daily. 180 capsule 1   Polyvinyl Alcohol (LIQUID TEARS OP) Place 1 drop into the right eye 2 (two) times daily.  potassium chloride SA (KLOR-CON M) 20 MEQ tablet TAKE 2 TABLETS TWICE A DAY 360 tablet 3   pravastatin (PRAVACHOL) 10 MG tablet TAKE 1 TABLET DAILY 90 tablet 3   temazepam (RESTORIL) 15 MG capsule Take 1 capsule (15 mg total) by mouth at bedtime as needed for sleep. 90 capsule 1   ursodiol  (ACTIGALL) 300 MG capsule Take 2 capsules (600 mg total) by mouth 2 (two) times daily. 360 capsule 3   Vitamin D, Ergocalciferol, 50000 units CAPS Take 50,000 Units by mouth every Friday. 12 capsule 3   warfarin (COUMADIN) 5 MG tablet TAKE 2 TABLETS BY MOUTH DAILY OR AS DIRECTED BY ANTICOAGULATION CLINIC 205 tablet 3   WIXELA INHUB 100-50 MCG/ACT AEPB USE 1 INHALATION EVERY 12 HOURS (RINSE MOUTH AFTER USING) 180 each 3   acarbose (PRECOSE) 50 MG tablet Take 1 tablet (50 mg total) by mouth 3 (three) times daily. 270 tablet 3   spironolactone (ALDACTONE) 100 MG tablet Take 100 mg by mouth daily.     No current facility-administered medications for this visit.    PHYSICAL EXAM: Vitals:   11/12/23 0822  BP: 110/60  Pulse: (!) 57  Resp: 20  SpO2: 91%  Weight: 238 lb 12.8 oz (108.3 kg)  Height: 5' 7.5" (1.715 m)    Body mass index is 36.85 kg/m.  Wt Readings from Last 3 Encounters:  11/12/23 238 lb 12.8 oz (108.3 kg)  11/09/23 242 lb (109.8 kg)  10/19/23 238 lb (108 kg)    General: Well developed, well nourished female in no apparent distress.  HEENT: AT/[place], no external lesions.  Eyes: Conjunctiva clear and no icterus. Neck: Neck supple  Lungs: Respirations not labored Neurologic: Alert, oriented, normal speech Extremities / Skin: Dry.  Psychiatric: Does not appear depressed or anxious  Diabetic Foot Exam - Simple   Simple Foot Form Visual Inspection Sensation Testing Pulse Check Comments Patient reports foot exam is completed by podiatry.    LABS Reviewed Lab Results  Component Value Date   HGBA1C 6.5 (A) 11/12/2023   HGBA1C 6.9 (A) 07/10/2023   HGBA1C 7.1 (H) 04/05/2023   No results found for: "FRUCTOSAMINE" Lab Results  Component Value Date   CHOL 206 (H) 01/11/2023   HDL 79.30 01/11/2023   LDLCALC 113 (H) 01/11/2023   TRIG 64.0 01/11/2023   CHOLHDL 3 01/11/2023   Lab Results  Component Value Date   MICRALBCREAT <15 06/26/2023   MICRALBCREAT 0.8  04/26/2021   Lab Results  Component Value Date   CREATININE 0.81 10/02/2023   Lab Results  Component Value Date   GFR 50.23 (L) 07/06/2023    ASSESSMENT / PLAN  1. DM type 2 with diabetic peripheral neuropathy (HCC)   2. Acquired autoimmune hypothyroidism   3. Hypoglycemia following gastrointestinal surgery   4. History of gastric surgery     Diabetes Mellitus type 2, complicated by peripheral neuropathy/CKD - Diabetic status / severity: Fair control  Lab Results  Component Value Date   HGBA1C 6.5 (A) 11/12/2023    - Hemoglobin A1c goal : <7%  - Medications: No change  I) continue acarbose 50 mg with meals 3 times a day.  - Home glucose testing: In the morning fasting and few times a week in the afternoon and at bedtime. - Discussed/ Gave Hypoglycemia treatment plan.  # Consult , not required this time.  # Annual urine for microalbuminuria/ creatinine ratio, no microalbuminuria currently.  Referred to nephrology due to CKD 3B in the previous visit.  Last  Lab Results  Component Value Date   MICRALBCREAT <15 06/26/2023    # Foot check nightly / neuropathy, on gabapentin and nortriptyline.  Following with podiatry.  Patient reports she had visit last week and had foot exam as well.  # Annual dilated diabetic eye exams.   - Diet: Make healthy diabetic food choices - Life style / activity / exercise: Discussed.  2. Blood pressure  -  BP Readings from Last 1 Encounters:  11/12/23 110/60    - Control is in target.  - No change in current plans.  3. Lipid status / Hyperlipidemia - Last  Lab Results  Component Value Date   LDLCALC 113 (H) 01/11/2023   - Continue pravastatin 10 mg daily.  Managed by primary care provider.  # Postprandial hypoglycemia related with history of gastric bypass surgery. -Continue acarbose 50 mg with meals 3 times a day.  # Primary hypothyroidism -Continue levothyroxine 112 mcg daily.  Annual lab.  TSH was normal in December  2024.   Diagnoses and all orders for this visit:  DM type 2 with diabetic peripheral neuropathy (HCC) -     POCT glycosylated hemoglobin (Hb A1C) -     Discontinue: acarbose (PRECOSE) 50 MG tablet; Take 1 tablet (50 mg total) by mouth 3 (three) times daily. -     acarbose (PRECOSE) 50 MG tablet; Take 1 tablet (50 mg total) by mouth 3 (three) times daily.  Acquired autoimmune hypothyroidism  Hypoglycemia following gastrointestinal surgery  History of gastric surgery    DISPOSITION Follow up in clinic in 4 months suggested.    All questions answered and patient verbalized understanding of the plan.  [place] Ardyth Kelso, MD [facility] Endocr[facility]ology [facility] [address] [place], [address] [place], [place] [address] Phone # [phone]  At least part of this note was generated using voice recognition software. Inadvertent word errors may have occurred, which were not recognized during the proofreading process.

## 2023-11-13 ENCOUNTER — Encounter: Payer: Self-pay | Admitting: Internal Medicine

## 2023-11-13 ENCOUNTER — Other Ambulatory Visit: Payer: Self-pay

## 2023-11-13 LAB — EXTRA SPECIMEN

## 2023-11-13 LAB — VITAMIN A

## 2023-11-13 NOTE — Telephone Encounter (Signed)
 Flonase refilled.

## 2023-11-13 NOTE — Telephone Encounter (Signed)
 Please if wanting to continue,says it is discontinued due to not needed

## 2023-11-16 ENCOUNTER — Ambulatory Visit (HOSPITAL_COMMUNITY)

## 2023-11-20 ENCOUNTER — Telehealth: Payer: Self-pay

## 2023-11-20 ENCOUNTER — Ambulatory Visit

## 2023-11-20 NOTE — Telephone Encounter (Signed)
 Pt returned call and rescheduled coumadin  clinic apt. Gave pt my condolences and to her family. RS apt. Pt appreciative and verbalized understanding.

## 2023-11-20 NOTE — Telephone Encounter (Signed)
 Pt LVM that she had to cancel her coumadin  clinic apt today due to her granddaughter passing unexpectedly.   LVM for pt to return call.

## 2023-11-29 NOTE — Telephone Encounter (Signed)
 Pt reports she does not think she can make the scheduled coumadin  clinic apt for tomorrow. She has a CT in the morning and may be able to come directly after that but her daughter has to go out of town and leave early after that. She will contact the coumadin  clinic to RS when possible.   Cancelled apt

## 2023-11-30 ENCOUNTER — Ambulatory Visit (HOSPITAL_COMMUNITY)
Admission: RE | Admit: 2023-11-30 | Discharge: 2023-11-30 | Disposition: A | Discharge status: Home / Self Care | Source: Ambulatory Visit | Attending: Internal Medicine | Admitting: Internal Medicine

## 2023-11-30 ENCOUNTER — Ambulatory Visit

## 2023-11-30 ENCOUNTER — Encounter: Payer: Self-pay | Admitting: Internal Medicine

## 2023-11-30 ENCOUNTER — Ambulatory Visit (INDEPENDENT_AMBULATORY_CARE_PROVIDER_SITE_OTHER)

## 2023-11-30 DIAGNOSIS — Z9884 Bariatric surgery status: Secondary | ICD-10-CM | POA: Diagnosis present

## 2023-11-30 DIAGNOSIS — Z7901 Long term (current) use of anticoagulants: Secondary | ICD-10-CM

## 2023-11-30 DIAGNOSIS — K743 Primary biliary cirrhosis: Secondary | ICD-10-CM | POA: Diagnosis present

## 2023-11-30 LAB — POCT INR: INR: 2.2 (ref 2.0–3.0)

## 2023-11-30 NOTE — Progress Notes (Signed)
 Continue 2 tablets daily except take 1 1/2 tablets on Tuesday and Saturday. Recheck in 5 weeks.

## 2023-11-30 NOTE — Patient Instructions (Addendum)
 Pre visit review using our clinic review tool, if applicable. No additional management support is needed unless otherwise documented below in the visit note.  Continue 2 tablets daily except take 1 1/2 tablets on Tuesday and Saturday. Recheck in 5 weeks.

## 2024-01-01 ENCOUNTER — Ambulatory Visit: Admitting: Podiatry

## 2024-01-01 ENCOUNTER — Ambulatory Visit (INDEPENDENT_AMBULATORY_CARE_PROVIDER_SITE_OTHER)

## 2024-01-01 DIAGNOSIS — Z7901 Long term (current) use of anticoagulants: Secondary | ICD-10-CM | POA: Diagnosis not present

## 2024-01-01 LAB — POCT INR: INR: 2.7 (ref 2.0–3.0)

## 2024-01-01 NOTE — Patient Instructions (Addendum)
 Pre visit review using our clinic review tool, if applicable. No additional management support is needed unless otherwise documented below in the visit note.  Continue 2 tablets daily except take 1 1/2 tablets on Tuesday and Saturday. Recheck in 6 weeks.

## 2024-01-01 NOTE — Progress Notes (Signed)
 Continue 2 tablets daily except take 1 1/2 tablets on Tuesday and Saturday. Recheck in 6 weeks.

## 2024-01-04 ENCOUNTER — Ambulatory Visit: Admitting: Family Medicine

## 2024-01-04 ENCOUNTER — Encounter: Payer: Self-pay | Admitting: Family Medicine

## 2024-01-04 VITALS — BP 122/84 | HR 51 | Temp 98.0°F | Ht 67.5 in | Wt 239.8 lb

## 2024-01-04 DIAGNOSIS — L304 Erythema intertrigo: Secondary | ICD-10-CM | POA: Diagnosis not present

## 2024-01-04 DIAGNOSIS — I872 Venous insufficiency (chronic) (peripheral): Secondary | ICD-10-CM

## 2024-01-04 DIAGNOSIS — E1142 Type 2 diabetes mellitus with diabetic polyneuropathy: Secondary | ICD-10-CM

## 2024-01-04 DIAGNOSIS — N1832 Chronic kidney disease, stage 3b: Secondary | ICD-10-CM

## 2024-01-04 DIAGNOSIS — I890 Lymphedema, not elsewhere classified: Secondary | ICD-10-CM | POA: Diagnosis not present

## 2024-01-04 MED ORDER — NYSTATIN 100000 UNIT/GM EX POWD: 1.0000 | Freq: Three times a day (TID) | CUTANEOUS | 1 refills | Status: AC

## 2024-01-04 NOTE — Progress Notes (Signed)
 Established Patient Office Visit   Subjective  Patient ID: Kelli Martinez, female    DOB: 1945-11-14  Age: 78 y.o. MRN: [id number]  Chief Complaint  Patient presents with   Medical Management of Chronic Issues     Lower left leg rash started 6 months ago, in the last 2 month is worse, Drainage, redness, swelling sore,   Pt accompanied by her daughter.  Patient is a 78 year old female seen for follow-up and several acute concerns.  Patient with history of breast cancer.  Has chronic lymphedema in right UE.  Had a compression sleeve, but needs a new one due to change in weight.  Having trouble getting new compression sleeve as previous suppliers no longer carry.  No suppliers in [place] where pt lives.  Patient with bilateral LE edema with rash and redness of left leg greater than right.  Rash present x 2 months.  Patient states legs are sore.  Edema makes it difficult to get on most shoes.  Taking Lasix  40 mg twice daily.  Previously on a higher dose but caused worsening renal function.  Patient endorses rash underneath pannus, occasionally pruritic.  Tried OTC [employer] powder.  Tries to keep skin clean and dry but finds it difficult.  Does not wear undergarments at night but still has symptoms.    Patient Active Problem List   Diagnosis Date Noted   Chronic pain syndrome 06/26/2023   Cervical radiculopathy 04/11/2023   Syncope 01/04/2023   Obesity (BMI 30-39.9) 01/04/2023   History of recurrent venous thromboembolism 01/04/2023   Type 2 diabetes mellitus with diabetic polyneuropathy, without long-term current use of insulin  (HCC) 01/04/2023   Hypoglycemia following gastrointestinal surgery 03/19/2018   Edema of both lower extremities due to peripheral venous insufficiency 11/20/2017   Maxillary sinusitis, acute 08/31/2017   Encounter for therapeutic drug monitoring 08/26/2013   Insomnia 05/01/2013   Long term (current) use of anticoagulants 04/15/2013   Postsurgical  dumping syndrome 04/06/2013   Bilateral leg edema 04/06/2013   Reactive hypoglycemia 04/02/2013   Abdominal pain intermittent-evalulating for internal hernia 11/07/2012   S/P laparoscopic cholecystectomy 05/31/2012   Roux Y Gastric Bypass April 2011; repair [facility] 11/22/2011   Iron deficiency anemia 11/14/2011   Hypertension    Left leg paresthesias 10/27/2010   Hypothyroidism 10/27/2010   Low back pain 10/27/2010   Obstructive sleep apnea 09/29/2007   PULMONARY EMBOLISM 09/25/2007   Seasonal and perennial allergic rhinitis 09/25/2007   Allergic asthma, mild persistent, uncomplicated 09/25/2007   BREAST CANCER, HX OF 09/25/2007   Past Medical History:  Diagnosis Date   Allergy    Anemia    Arthritis    Asthma    Back pain, chronic    GETS INJECTIONS IN BACK   Blind right eye    hemorrhage   Blood transfusion    Cancer (HCC)    breast 1994   Clotting disorder (HCC)    Diabetes mellitus without complication (HCC)    takes precose    Elevated liver enzymes    Gallstones    GERD (gastroesophageal reflux disease)    HX: breast cancer    Hyperlipidemia    Hypertension    Hypothyroid    Lymphedema of arm    RT   OSA (obstructive sleep apnea)    has not used in 2 years-lost weight   Osteoporosis    Peripheral neuropathy    on gabapentin    PONV (postoperative nausea and vomiting)    Psoriasis    Pulmonary embolism (  HCC) 1998 / 1994 /1968   Syncope and collapse 2011   due to anemia   Past Surgical History:  Procedure Laterality Date   ABDOMINAL HYSTERECTOMY     BONE MARROW TRANSPLANT  1994   CARDIOVASCULAR STRESS TEST  05/23/2005   EF 53%   carpal tunnell     bil   cataracts     CHOLECYSTECTOMY  05/10/2012   Procedure: LAPAROSCOPIC CHOLECYSTECTOMY WITH INTRAOPERATIVE CHOLANGIOGRAM;  Surgeon: Azucena Bollard, MD;  Location: [facility];  Service: General;  Laterality: N/A;  Laparoscopic Cholecystectomy with Intraoperative Cholangiogram   GASTRIC BYPASS  2011   bariatric  surgery   HIATAL HERNIA REPAIR     IVC filter     recurrent DVT   KNEE ARTHROTOMY  1998   MASS EXCISION Right 11/24/2016   Procedure: EXCISION RIGHT AXILLARY LESION AND CHEST WALL LESION;  Surgeon: Jacolyn Matar, MD;  Location: [facility];  Service: General;  Laterality: Right;   MASTECTOMY     Right   US  ECHOCARDIOGRAPHY  10/27/08   EF 55-60%   Social History   Tobacco Use   Smoking status: Former    Current packs/day: 0.00    Average packs/day: 0.5 packs/day for 10.0 years (5.0 ttl pk-yrs)    Types: Cigarettes    Start date: 07/31/1968    Quit date: 07/31/1978    Years since quitting: 45.4   Smokeless tobacco: Never  Vaping Use   Vaping status: Never Used  Substance Use Topics   Alcohol  use: No   Drug use: No   Family History  Problem Relation Age of Onset   Sudden death Mother        car accident   Cancer Father 69       lung   Cancer Sister 59       lung cancer   Asthma Daughter    Colon cancer Neg Hx    Stomach cancer Neg Hx    Esophageal cancer Neg Hx    Colon polyps Neg Hx    Allergies  Allergen Reactions   Adhesive [Tape] Rash    ROS Negative unless stated above    Objective:      BP 122/84 (BP Location: Left Wrist, Patient Position: Sitting, Cuff Size: Normal)   Pulse (!) 51   Temp 98 F (36.7 C) (Oral)   Ht 5' 7.5" (1.715 m)   Wt 239 lb 12.8 oz (108.8 kg)   SpO2 98%   BMI 37.00 kg/m  BP Readings from Last 3 Encounters:  01/04/24 122/84  11/12/23 110/60  11/09/23 122/70   Wt Readings from Last 3 Encounters:  01/04/24 239 lb 12.8 oz (108.8 kg)  11/12/23 238 lb 12.8 oz (108.3 kg)  11/09/23 242 lb (109.8 kg)      Physical Exam Constitutional:      General: She is not in acute distress.    Appearance: Normal appearance.  HENT:     Head: Normocephalic and atraumatic.     Nose: Nose normal.     Mouth/Throat:     Mouth: Mucous membranes are moist.  Cardiovascular:     Rate and Rhythm: Normal rate and regular rhythm.      Heart sounds: Normal heart sounds. No murmur heard.    No gallop.     Comments: Bilateral LE edema L>R 1+ to knee.  Chronic venous stasis changes of left mid lower leg. Pulmonary:     Effort: Pulmonary effort is normal. No respiratory distress.  Breath sounds: Normal breath sounds. No wheezing, rhonchi or rales.  Musculoskeletal:     Right lower leg: Edema present.     Left lower leg: Edema present.  Skin:    General: Skin is warm and dry.     Comments: Mildly erythematous/ruddy appearance to bilateral LEs L>R with blisters forming.  Erythematous moist appearing rash underneath pannus.  Healing abrasion of right lower flank/hip from adult briefs rubbing against skin.  Neurological:     Mental Status: She is alert and oriented to person, place, and time.        01/04/2024    9:47 AM 10/17/2023    3:14 PM 07/06/2023    9:49 AM  Depression screen PHQ 2/9  Decreased Interest 0 0 0  Down, Depressed, Hopeless 0 0 0  PHQ - 2 Score 0 0 0  Altered sleeping 0  2  Tired, decreased energy 1  3  Change in appetite 1  2  Feeling bad or failure about yourself  0  0  Trouble concentrating 0  0  Moving slowly or fidgety/restless 0  0  Suicidal thoughts 0  0  PHQ-9 Score 2  7  Difficult doing work/chores Somewhat difficult        01/04/2024    9:47 AM 07/06/2023    9:49 AM 03/29/2023   10:26 AM 01/15/2023   10:36 AM  GAD 7 : Generalized Anxiety Score  Nervous, Anxious, on Edge 0 0 0 0  Control/stop worrying 0 0 1 0  Worry too much - different things 1 1 2  0  Trouble relaxing 0  0 0  Restless 0 0 0 0  Easily annoyed or irritable 0 0 1 0  Afraid - awful might happen 0 0 1 0  Total GAD 7 Score 1  5 0  Anxiety Difficulty Not difficult at all  Somewhat difficult      No results found for any visits on 01/04/24.    Assessment & Plan:   Lymphedema of right arm -     Ambulatory referral to Physical Therapy  Chronic venous stasis dermatitis of both lower extremities  Intertrigo -      Nystatin ; Apply 1 Application topically 3 (three) times daily.  Dispense: 30 g; Refill: 1  Type 2 diabetes mellitus with diabetic polyneuropathy, without long-term current use of insulin  (HCC)  Chronic kidney disease, stage 3b (HCC)  Referral placed for compression sleeve fitting due to patient's history of lymphedema status post breast cancer.  Will complete Rx based thighs.  Patient with chronic venous stasis changes in bilateral LEs from continued edema.  Discussed taking an additional half tab (20 mg) of Lasix  in a.m. x 3 days along with regular dosing of Lasix  40 mg twice daily to help improve edema.  Patient will also take an additional dose of potassium supplement.  Bilateral LEs wrapped with Ace bandage while pt in clinic.  Discussed wrapping at home versus trying to find a lymphedema clinic your [place], [place].  Nystatin  powder for intertrigo.  Hemoglobin A1c 6.5% on/14/25.  Continue current medications.  Continue diet changes.  Return in about 4 months (around 05/05/2024), or if symptoms worsen or fail to improve.   Viola Greulich, MD

## 2024-01-04 NOTE — Patient Instructions (Addendum)
 A prescription for nystatin powder was sent to your pharmacy.  You can use this with a rash and irritation underneath your stomach.  Once the redness clears up you can switch to the over-the-counter powder you have been using.  Take an extra dose of Lasix  40 mg (1 tab) for the next 3 mornings.  This means she will take 2 tabs of Lasix  for 3 mornings and continue your regular dose of 1 tab in the evening.  Take an extra dose of potassium supplement for the 3 days that you are doing this.  After the 3 days return to your normal dosing of 40 mg in the morning and 40 mg in the afternoon.

## 2024-01-14 ENCOUNTER — Other Ambulatory Visit: Payer: Self-pay | Admitting: Family Medicine

## 2024-01-14 DIAGNOSIS — R600 Localized edema: Secondary | ICD-10-CM

## 2024-01-14 DIAGNOSIS — I890 Lymphedema, not elsewhere classified: Secondary | ICD-10-CM

## 2024-01-15 ENCOUNTER — Ambulatory Visit (INDEPENDENT_AMBULATORY_CARE_PROVIDER_SITE_OTHER): Admitting: Podiatry

## 2024-01-15 ENCOUNTER — Other Ambulatory Visit: Payer: Self-pay

## 2024-01-15 DIAGNOSIS — M79671 Pain in right foot: Secondary | ICD-10-CM

## 2024-01-15 DIAGNOSIS — B351 Tinea unguium: Secondary | ICD-10-CM | POA: Diagnosis not present

## 2024-01-15 DIAGNOSIS — E1151 Type 2 diabetes mellitus with diabetic peripheral angiopathy without gangrene: Secondary | ICD-10-CM | POA: Diagnosis not present

## 2024-01-15 DIAGNOSIS — M79672 Pain in left foot: Secondary | ICD-10-CM

## 2024-01-15 DIAGNOSIS — I70209 Unspecified atherosclerosis of native arteries of extremities, unspecified extremity: Secondary | ICD-10-CM

## 2024-01-15 DIAGNOSIS — E1142 Type 2 diabetes mellitus with diabetic polyneuropathy: Secondary | ICD-10-CM

## 2024-01-15 MED ORDER — VITAMIN D (ERGOCALCIFEROL) 50000 UNITS PO CAPS: 50000.0000 [IU] | ORAL_CAPSULE | ORAL | 3 refills | Status: DC

## 2024-01-15 MED ORDER — VITAMIN D (ERGOCALCIFEROL) 50000 UNITS PO CAPS: 50000.0000 [IU] | ORAL_CAPSULE | ORAL | 0 refills | Status: DC

## 2024-01-15 MED ORDER — ACARBOSE 50 MG PO TABS: 50.0000 mg | ORAL_TABLET | Freq: Three times a day (TID) | ORAL | 3 refills | Status: AC

## 2024-01-15 NOTE — Progress Notes (Signed)
 Patient presents for evaluation and treatment of tenderness and some redness around nails feet.  Tenderness around toes with walking and wearing shoes.  Physical exam:  General appearance: Alert, pleasant, and in no acute distress.  Vascular: Pedal pulses: DP 2/4 B/L, PT 0/4 B/L. Severe edema lower legs bilaterally  Neurological:  No paresthesias or burning noted  Dermatologic:  Nails thickened, disfigured, discolored 1-5 BL with subungual debris.  Redness and hypertrophic nail folds along nail folds bilaterally but no signs of drainage or infection.  Musculoskeletal:     Diagnosis: 1. Painful onychomycotic nails 1 through 5 bilaterally. 2. Pain toes 1 through 5 bilaterally. 3.  Diabetes mellitus type 2 with PVD  Plan: Debrided onychomycotic nails 1 through 5 bilaterally.  Return 3 months [facility]

## 2024-01-18 ENCOUNTER — Other Ambulatory Visit: Payer: Self-pay

## 2024-01-29 ENCOUNTER — Other Ambulatory Visit: Payer: Self-pay

## 2024-01-29 DIAGNOSIS — E1142 Type 2 diabetes mellitus with diabetic polyneuropathy: Secondary | ICD-10-CM

## 2024-01-29 MED ORDER — VITAMIN D (ERGOCALCIFEROL) 50000 UNITS PO CAPS
50000.0000 [IU] | ORAL_CAPSULE | ORAL | 0 refills | Status: DC
Start: 2024-02-01 — End: 2024-02-13

## 2024-02-12 ENCOUNTER — Ambulatory Visit (INDEPENDENT_AMBULATORY_CARE_PROVIDER_SITE_OTHER)

## 2024-02-12 DIAGNOSIS — Z7901 Long term (current) use of anticoagulants: Secondary | ICD-10-CM

## 2024-02-12 LAB — POCT INR: INR: 2 (ref 2.0–3.0)

## 2024-02-12 MED ORDER — WARFARIN SODIUM 5 MG PO TABS: ORAL_TABLET | ORAL | 3 refills | Status: DC

## 2024-02-12 NOTE — Progress Notes (Signed)
 Pt reports eating more greens and vegetables. Educated pt to keep these foods consistent in her diet. Pt verbalized understanding.  Continue 2 tablets daily except take 1 1/2 tablets on Tuesday and Saturday. Recheck in 6 weeks.  Pt is compliant with warfarin management and PCP apts.  Sent in refill of warfarin to requested pharmacy.

## 2024-02-12 NOTE — Patient Instructions (Addendum)
 Pre visit review using our clinic review tool, if applicable. No additional management support is needed unless otherwise documented below in the visit note.,h  Continue 2 tablets daily except take 1 1/2 tablets on Tuesday and Saturday. Recheck in 6 weeks.

## 2024-02-13 ENCOUNTER — Telehealth: Payer: Self-pay

## 2024-02-13 ENCOUNTER — Other Ambulatory Visit: Payer: Self-pay

## 2024-02-13 DIAGNOSIS — Z7901 Long term (current) use of anticoagulants: Secondary | ICD-10-CM

## 2024-02-13 DIAGNOSIS — E1142 Type 2 diabetes mellitus with diabetic polyneuropathy: Secondary | ICD-10-CM

## 2024-02-13 MED ORDER — WARFARIN SODIUM 5 MG PO TABS: ORAL_TABLET | ORAL | 3 refills | Status: DC

## 2024-02-13 MED ORDER — VITAMIN D (ERGOCALCIFEROL) 50000 UNITS PO CAPS: 50000.0000 [IU] | ORAL_CAPSULE | ORAL | 2 refills | Status: AC

## 2024-02-13 NOTE — Telephone Encounter (Signed)
 Pt LVM that she was wrong where she needed the script for warfarin sent to. It was sent to [facility] per pt request. She reported on the VM that it needs sent to [facility].   Sent new script to requested pharmacy.  Contacted pt and advised it was sent. Pt appreciative.

## 2024-03-14 ENCOUNTER — Encounter: Payer: Self-pay | Admitting: Endocrinology

## 2024-03-14 ENCOUNTER — Ambulatory Visit: Payer: Self-pay | Admitting: Endocrinology

## 2024-03-14 ENCOUNTER — Ambulatory Visit (INDEPENDENT_AMBULATORY_CARE_PROVIDER_SITE_OTHER): Admitting: Endocrinology

## 2024-03-14 VITALS — BP 128/60 | HR 55 | Resp 20 | Ht 67.5 in | Wt 248.2 lb

## 2024-03-14 DIAGNOSIS — R001 Bradycardia, unspecified: Secondary | ICD-10-CM

## 2024-03-14 DIAGNOSIS — E1142 Type 2 diabetes mellitus with diabetic polyneuropathy: Secondary | ICD-10-CM

## 2024-03-14 DIAGNOSIS — Z7984 Long term (current) use of oral hypoglycemic drugs: Secondary | ICD-10-CM

## 2024-03-14 DIAGNOSIS — K912 Postsurgical malabsorption, not elsewhere classified: Secondary | ICD-10-CM | POA: Diagnosis not present

## 2024-03-14 DIAGNOSIS — E063 Autoimmune thyroiditis: Secondary | ICD-10-CM | POA: Diagnosis not present

## 2024-03-14 LAB — POCT GLYCOSYLATED HEMOGLOBIN (HGB A1C): Hemoglobin A1C: 6.7 % — AB (ref 4.0–5.6)

## 2024-03-14 NOTE — Progress Notes (Signed)
 Outpatient Endocrinology Note [place] Abagael Kramm, MD  03/14/24  Patient's Name: Kelli Martinez    DOB: 1946-01-03    MRN: [id number]                                                    REASON OF VISIT: Follow up for type 2 diabetes mellitus / hypothyroidism  PCP: [name], MD  HISTORY OF PRESENT ILLNESS:   Kelli Martinez is a 78 y.o. old female with past medical history listed below, is here for follow up of type 2 diabetes mellitus and hypoglycemia / hypothyroidism.   Pertinent Diabetes History: Patient was diagnosed with type 2 diabetes mellitus several years ago, has mild and well-controlled since her gastric bypass surgery in 2011.  She has postprandial hypoglycemia related with dumping syndrome.  Chronic Diabetes Complications : Retinopathy: no. Last ophthalmology exam was done on annually reportedly. Nephropathy: CKD IIIb, was referred to nephrology. Peripheral neuropathy: yes, on burning, numbness, on gabapentin and nortriptyline, following with podiatry. Coronary artery disease: no Stroke: no  Relevant comorbidities and cardiovascular risk factors: Obesity: yes Body mass index is 38.3 kg/m.  Hypertension: yes Hyperlipidemia. Yes, on statin.   Current / Home Diabetic regimen includes: Acarbose 50 mg 3 times a day with meals.  Prior diabetic medications: Metformin stopped due to tendency of hypoglycemia.  Glycemic data:   Not able to download glucose data in the clinic today.  Some of the blood sugars reviewed as follows, taking at different times of the day.  95, 105, 65, 108, 143, 96, 172, 260, 165  Hypoglycemia: Patient has no hypoglycemic episodes. Patient has hypoglycemia awareness.  Factors modifying glucose control: 1.  Diabetic diet assessment: 3 meals a day.  2.  Staying active or exercising: not able to do exercise.  3.  Medication compliance: compliant all of the time.  # Postprandial hypoglycemia related to dumping  syndrome. She had gastric bypass surgery in 2011. -She uses glucose tablet or juice to correct hypoglycemia and hypoglycemic symptoms.  Usually 1 to 2 tablets are educated. -She had seen dietitian instructed for mixed meal diet with low-fat meals enough protein consistently and restricting carbohydrates including fluids. -She did try verapamil previously without benefit. -She is on acarbose for hypoglycemia as well.  # Primary hypothyroidism for several years. -On Thyroid hormone replacement/levothyroxine 112 mcg daily.    Interval history  Glucometer data as reviewed above.  She has rare hypoglycemia.  Lowest blood sugar 65.  Mostly acceptable blood sugar with occasional high blood sugar.  She has been on acarbose.  Patient complains of occasional dizziness especially when standing up.  Her heart rate in the clinic in low 50s.  On review of the medication list she is not on any heart rate lowering medication.  She has been taking levothyroxine 112 mcg daily.  She reports compliance and taking in the morning.  She was seen by podiatry in June of this year.  No other complaints today.  She is accompanied by daughter in the clinic today.   REVIEW OF SYSTEMS As per history of present illness.   PAST MEDICAL HISTORY: Past Medical History:  Diagnosis Date   Allergy    Anemia    Arthritis    Asthma    Back pain, chronic    GETS INJECTIONS IN BACK   Blind  right eye    hemorrhage   Blood transfusion    Cancer (HCC)    breast 1994   Clotting disorder (HCC)    Diabetes mellitus without complication (HCC)    takes precose   Elevated liver enzymes    Gallstones    GERD (gastroesophageal reflux disease)    HX: breast cancer    Hyperlipidemia    Hypertension    Hypothyroid    Lymphedema of arm    RT   OSA (obstructive sleep apnea)    has not used in 2 years-lost weight   Osteoporosis    Peripheral neuropathy    on gabapentin   PONV (postoperative nausea and vomiting)     Psoriasis    Pulmonary embolism (HCC) 1998 / 1994 /1968   Syncope and collapse 2011   due to anemia    PAST SURGICAL HISTORY: Past Surgical History:  Procedure Laterality Date   ABDOMINAL HYSTERECTOMY     BONE MARROW TRANSPLANT  1994   CARDIOVASCULAR STRESS TEST  05/23/2005   EF 53%   carpal tunnell     bil   cataracts     CHOLECYSTECTOMY  05/10/2012   Procedure: LAPAROSCOPIC CHOLECYSTECTOMY WITH INTRAOPERATIVE CHOLANGIOGRAM;  Surgeon: Donnice KATHEE Lunger, MD;  Location: [facility];  Service: General;  Laterality: N/A;  Laparoscopic Cholecystectomy with Intraoperative Cholangiogram   GASTRIC BYPASS  2011   bariatric surgery   HIATAL HERNIA REPAIR     IVC filter     recurrent DVT   KNEE ARTHROTOMY  1998   MASS EXCISION Right 11/24/2016   Procedure: EXCISION RIGHT AXILLARY LESION AND CHEST WALL LESION;  Surgeon: Donnice Lunger, MD;  Location: [facility];  Service: General;  Laterality: Right;   MASTECTOMY     Right   US  ECHOCARDIOGRAPHY  10/27/08   EF 55-60%    ALLERGIES: Allergies  Allergen Reactions   Adhesive [Tape] Rash    FAMILY HISTORY:  Family History  Problem Relation Age of Onset   Sudden death Mother        car accident   Cancer Father 77       lung   Cancer Sister 69       lung cancer   Asthma Daughter    Colon cancer Neg Hx    Stomach cancer Neg Hx    Esophageal cancer Neg Hx    Colon polyps Neg Hx     SOCIAL HISTORY: Social History   Socioeconomic History   Marital status: Widowed    Spouse name: Not on file   Number of children: 2   Years of education: Not on file   Highest education level: Associate degree: occupational, Scientist, product/process development, or vocational program  Occupational History   Occupation: retired  Tobacco Use   Smoking status: Former    Current packs/day: 0.00    Average packs/day: 0.5 packs/day for 10.0 years (5.0 ttl pk-yrs)    Types: Cigarettes    Start date: 07/31/1968    Quit date: 07/31/1978    Years since quitting: 45.6    Smokeless tobacco: Never  Vaping Use   Vaping status: Never Used  Substance and Sexual Activity   Alcohol use: No   Drug use: No   Sexual activity: Not Currently  Other Topics Concern   Not on file  Social History Narrative   Widowed with 2 children,  Lives in a one story home and her son and his wife recently moved in with her.  Retired Production designer, theatre/television/film for [employer].  Education: some college.    Social Drivers of Corporate investment banker Strain: Low Risk  (10/17/2023)   Overall Financial Resource Strain (CARDIA)    Difficulty of Paying Living Expenses: Not hard at all  Food Insecurity: No Food Insecurity (10/17/2023)   Hunger Vital Sign    Worried About Running Out of Food in the Last Year: Never true    Ran Out of Food in the Last Year: Never true  Transportation Needs: No Transportation Needs (10/17/2023)   PRAPARE - Administrator, Civil Service (Medical): No    Lack of Transportation (Non-Medical): No  Physical Activity: Insufficiently Active (10/17/2023)   Exercise Vital Sign    Days of Exercise per Week: 7 days    Minutes of Exercise per Session: 20 min  Stress: No Stress Concern Present (10/17/2023)   [employer] of Occupational Health - Occupational Stress Questionnaire    Feeling of Stress : Not at all  Social Connections: Moderately Integrated (10/17/2023)   Social Connection and Isolation Panel    Frequency of Communication with Friends and Family: More than three times a week    Frequency of Social Gatherings with Friends and Family: More than three times a week    Attends Religious Services: More than 4 times per year    Active Member of [employer] or Organizations: Yes    Attends Banker Meetings: More than 4 times per year    Marital Status: Widowed    MEDICATIONS:  Current Outpatient Medications  Medication Sig Dispense Refill   acarbose (PRECOSE) 50 MG tablet Take 1 tablet (50 mg total) by mouth 3 (three) times daily. 270 tablet 3   baclofen  (LIORESAL) 20 MG tablet TAKE 1 TABLET THREE TIMES A DAY (UPDATED PRESCRIPTION) 270 tablet 3   Blood Glucose Monitoring Suppl (FREESTYLE FREEDOM LITE) w/Device KIT Use as directed to check blood sugar once daily. 1 kit 0   Calcium Carb-Cholecalciferol (CALTRATE BONE HEALTH PO) Take 1 tablet by mouth daily.     Calcium Citrate (CALCITRATE PO) Take 1 tablet by mouth daily.     clobetasol cream (TEMOVATE) 0.05 % APPLY 1 APPLICATION TOPICALLY TWICE A DAY 120 g 5   Cyanocobalamin (VITAMIN B-12 PO) Take 1 tablet by mouth every other day.     dorzolamide-timolol (COSOPT) 2-0.5 % ophthalmic solution Place 1 drop into the left eye 2 (two) times daily.     Elastic Bandages & Supports (TRUFORM ARM SLEEVE L 15-20MMHG) MISC 1 Device by Does not apply route as directed. 1 each 1   furosemide (LASIX) 40 MG tablet TAKE 1 TABLET TWICE A DAY 180 tablet 3   gabapentin (NEURONTIN) 600 MG tablet TAKE 1 TABLET THREE TIMES A DAY (DOSE CHANGE) 270 tablet 3   glucose blood (FREESTYLE LITE) test strip Use as instructed 100 strip 3   HYDROcodone-acetaminophen (NORCO) 5-325 MG tablet Take 1 tablet by mouth 2 (two) times daily as needed for moderate pain. 50 tablet 0   Lancets (FREESTYLE) lancets USE AS INSTRUCTED TO CHECK BLOOD SUGAR ONCE DAILY 100 each 1   levothyroxine (SYNTHROID) 112 MCG tablet Take 1 tablet (112 mcg total) by mouth daily. 90 tablet 3   Magnesium 250 MG TABS Take 250 mg by mouth 2 (two) times daily.     nystatin (MYCOSTATIN/NYSTOP) powder Apply 1 Application topically 3 (three) times daily. 30 g 1   omeprazole (PRILOSEC) 40 MG capsule Take 1 capsule (40 mg total) by mouth daily. 180 capsule 1  Polyvinyl Alcohol (LIQUID TEARS OP) Place 1 drop into the right eye 2 (two) times daily.     potassium chloride SA (KLOR-CON M) 20 MEQ tablet TAKE 2 TABLETS TWICE A DAY 360 tablet 3   pravastatin (PRAVACHOL) 10 MG tablet TAKE 1 TABLET DAILY 90 tablet 3   temazepam (RESTORIL) 15 MG capsule Take 1 capsule (15 mg  total) by mouth at bedtime as needed for sleep. 90 capsule 1   ursodiol (ACTIGALL) 300 MG capsule Take 2 capsules (600 mg total) by mouth 2 (two) times daily. 360 capsule 3   Vitamin D, Ergocalciferol, 50000 units CAPS Take 50,000 Units by mouth every Friday. 4 capsule 2   warfarin (COUMADIN) 5 MG tablet TAKE 2 TABLETS BY MOUTH DAILY EXCEPT TAKE 1 1/2 ON TUESDAY AND SATURDAY OR AS DIRECTED BY ANTICOAGULATION CLINIC 205 tablet 3   WIXELA INHUB 100-50 MCG/ACT AEPB USE 1 INHALATION EVERY 12 HOURS (RINSE MOUTH AFTER USING) 180 each 3   No current facility-administered medications for this visit.    PHYSICAL EXAM: Vitals:   03/14/24 0842  BP: 128/60  Pulse: (!) 55  Resp: 20  SpO2: 96%  Weight: 248 lb 3.2 oz (112.6 kg)  Height: 5' 7.5 (1.715 m)    Body mass index is 38.3 kg/m.  Wt Readings from Last 3 Encounters:  03/14/24 248 lb 3.2 oz (112.6 kg)  01/04/24 239 lb 12.8 oz (108.8 kg)  11/12/23 238 lb 12.8 oz (108.3 kg)    General: Well developed, well nourished female in no apparent distress.  HEENT: AT/[place], no external lesions.  Eyes: Conjunctiva clear and no icterus. Neck: Neck supple  Lungs: Respirations not labored Neurologic: Alert, oriented, normal speech Extremities / Skin: Dry.  Psychiatric: Does not appear depressed or anxious  Diabetic Foot Exam - Simple   Simple Foot Form Diabetic Foot exam was performed with the following findings: Yes 03/14/2024  8:59 AM  Visual Inspection Sensation Testing Pulse Check Comments Completed by Podiatry on 01/15/2024    LABS Reviewed Lab Results  Component Value Date   HGBA1C 6.7 (A) 03/14/2024   HGBA1C 6.5 (A) 11/12/2023   HGBA1C 6.9 (A) 07/10/2023   No results found for: FRUCTOSAMINE Lab Results  Component Value Date   CHOL 206 (H) 01/11/2023   HDL 79.30 01/11/2023   LDLCALC 113 (H) 01/11/2023   TRIG 64.0 01/11/2023   CHOLHDL 3 01/11/2023   Lab Results  Component Value Date   MICRALBCREAT <15 06/26/2023   Lab  Results  Component Value Date   CREATININE 0.81 10/02/2023   Lab Results  Component Value Date   GFR 50.23 (L) 07/06/2023    ASSESSMENT / PLAN  1. DM type 2 with diabetic peripheral neuropathy (HCC)   2. Acquired autoimmune hypothyroidism   3. Hypoglycemia following gastrointestinal surgery   4. Bradycardia     Diabetes Mellitus type 2, complicated by peripheral neuropathy/CKD - Diabetic status / severity: Fair control  Lab Results  Component Value Date   HGBA1C 6.7 (A) 03/14/2024    - Hemoglobin A1c goal : <7%  - Medications: No change  I) continue acarbose 50 mg with meals 3 times a day.  - Home glucose testing: In the morning fasting and few times a week in the afternoon and at bedtime and as needed. - Discussed/ Gave Hypoglycemia treatment plan.  Patient is advised to eat protein rich/mixed meal diet and avoid pure sugary food to avoid hypoglycemia.  Patient is advised to carry glucose tablet all the time.  #  Consult , not required this time.  # Annual urine for microalbuminuria/ creatinine ratio, no microalbuminuria currently.  Referred to nephrology due to CKD 3B in the previous visit. Last  Lab Results  Component Value Date   MICRALBCREAT <15 06/26/2023    # Foot check nightly / neuropathy, on gabapentin and nortriptyline.  Following with podiatry.  Patient reports she had visit in June of this year and had foot exam as well.  # Annual dilated diabetic eye exams.   - Diet: Make healthy diabetic food choices - Life style / activity / exercise: Discussed.  2. Blood pressure  -  BP Readings from Last 1 Encounters:  03/14/24 128/60    - Control is in target.  - No change in current plans.  3. Lipid status / Hyperlipidemia - Last  Lab Results  Component Value Date   LDLCALC 113 (H) 01/11/2023   - Continue pravastatin 10 mg daily.  Managed by primary care provider.  # Postprandial hypoglycemia related with history of gastric bypass  surgery. -Continue acarbose 50 mg with meals 3 times a day.  # Primary hypothyroidism -Continue levothyroxine 112 mcg daily.  Annual lab.  - Will check thyroid function test today.  # Patient has heart rate in 50s in the clinic today. - She has complaints of intermittent lightheadedness.  She is not on heart rate lowering medication.  Will check thyroid function test today. - Sent message to her primary care provider for further evaluation as well.   Diagnoses and all orders for this visit:  DM type 2 with diabetic peripheral neuropathy (HCC) -     POCT glycosylated hemoglobin (Hb A1C)  Acquired autoimmune hypothyroidism -     T4, free -     TSH  Hypoglycemia following gastrointestinal surgery  Bradycardia    DISPOSITION Follow up in clinic in 3 months suggested.    All questions answered and patient verbalized understanding of the plan.  [place] Erasto Sleight, MD [facility] Endocr[facility] [address] [place], [address] [place], [place] [address] Phone # [phone]  At least part of this note was generated using voice recognition software. Inadvertent word errors may have occurred, which were not recognized during the proofreading process.

## 2024-03-15 LAB — T4, FREE: Free T4: 1.3 ng/dL (ref 0.8–1.8)

## 2024-03-15 LAB — TSH: TSH: 2.32 m[IU]/L (ref 0.40–4.50)

## 2024-03-17 ENCOUNTER — Ambulatory Visit: Payer: Medicare Other | Admitting: Internal Medicine

## 2024-03-25 ENCOUNTER — Ambulatory Visit (INDEPENDENT_AMBULATORY_CARE_PROVIDER_SITE_OTHER)

## 2024-03-25 DIAGNOSIS — Z7901 Long term (current) use of anticoagulants: Secondary | ICD-10-CM | POA: Diagnosis not present

## 2024-03-25 LAB — POCT INR: INR: 2.3 (ref 2.0–3.0)

## 2024-03-25 NOTE — Patient Instructions (Addendum)
 Pre visit review using our clinic review tool, if applicable. No additional management support is needed unless otherwise documented below in the visit note.  Continue 2 tablets daily except take 1 1/2 tablets on Tuesday and Saturday. Recheck in 6 weeks.

## 2024-03-25 NOTE — Progress Notes (Signed)
 Continue 2 tablets daily except take 1 1/2 tablets on Tuesday and Saturday. Recheck in 6 weeks.

## 2024-04-14 ENCOUNTER — Ambulatory Visit: Admitting: Family Medicine

## 2024-04-16 ENCOUNTER — Ambulatory Visit: Admitting: Podiatry

## 2024-04-21 ENCOUNTER — Encounter: Payer: Self-pay | Admitting: Podiatry

## 2024-04-21 ENCOUNTER — Ambulatory Visit (INDEPENDENT_AMBULATORY_CARE_PROVIDER_SITE_OTHER): Admitting: Podiatry

## 2024-04-21 DIAGNOSIS — M79671 Pain in right foot: Secondary | ICD-10-CM

## 2024-04-21 DIAGNOSIS — B351 Tinea unguium: Secondary | ICD-10-CM

## 2024-04-21 DIAGNOSIS — M79672 Pain in left foot: Secondary | ICD-10-CM

## 2024-04-21 NOTE — Progress Notes (Signed)
 Patient presents for evaluation and treatment of tenderness and some redness around nails feet.  Tenderness around toes with walking and wearing shoes.  Physical exam:  General appearance: Alert, pleasant, and in no acute distress.  Vascular: Pedal pulses: DP 2/4 B/L, PT 0/4 B/L.  Severe edema lower legs bilaterally.  Neu  Dermatologic:  Nails thickened, disfigured, discolored 1-5 BL with subungual debris.  Redness and hypertrophic nail folds along nail folds bilaterally but no signs of drainage or infection.  Musculoskeletal:     Diagnosis: 1. Painful onychomycotic nails 1 through 5 bilaterally. 2. Pain toes 1 through 5 bilaterally.  Plan: -Debrided onychomycotic nails 1 through 5 bilaterally.  Sharply debrided nails with nail clipper and reduced with a power bur.  Return 3 months [facility]

## 2024-05-05 ENCOUNTER — Ambulatory Visit: Admitting: Family Medicine

## 2024-05-05 ENCOUNTER — Encounter: Payer: Self-pay | Admitting: Family Medicine

## 2024-05-05 VITALS — BP 122/74 | HR 56 | Temp 97.7°F | Ht 67.5 in | Wt 241.6 lb

## 2024-05-05 DIAGNOSIS — R0602 Shortness of breath: Secondary | ICD-10-CM

## 2024-05-05 DIAGNOSIS — R600 Localized edema: Secondary | ICD-10-CM

## 2024-05-05 DIAGNOSIS — L409 Psoriasis, unspecified: Secondary | ICD-10-CM

## 2024-05-05 DIAGNOSIS — R42 Dizziness and giddiness: Secondary | ICD-10-CM | POA: Diagnosis not present

## 2024-05-05 DIAGNOSIS — R2689 Other abnormalities of gait and mobility: Secondary | ICD-10-CM

## 2024-05-05 DIAGNOSIS — I890 Lymphedema, not elsewhere classified: Secondary | ICD-10-CM | POA: Diagnosis not present

## 2024-05-05 DIAGNOSIS — Z23 Encounter for immunization: Secondary | ICD-10-CM

## 2024-05-05 DIAGNOSIS — I10 Essential (primary) hypertension: Secondary | ICD-10-CM | POA: Diagnosis not present

## 2024-05-05 DIAGNOSIS — E1142 Type 2 diabetes mellitus with diabetic polyneuropathy: Secondary | ICD-10-CM | POA: Diagnosis not present

## 2024-05-05 DIAGNOSIS — R531 Weakness: Secondary | ICD-10-CM

## 2024-05-05 DIAGNOSIS — Z7984 Long term (current) use of oral hypoglycemic drugs: Secondary | ICD-10-CM

## 2024-05-05 MED ORDER — CLOBETASOL PROPIONATE 0.05 % EX CREA: TOPICAL_CREAM | Freq: Two times a day (BID) | CUTANEOUS | 5 refills | Status: AC

## 2024-05-05 NOTE — Progress Notes (Signed)
 Established Patient Office Visit   Subjective  Patient ID: Kelli Martinez, female    DOB: 1946/05/24  Age: 78 y.o. MRN: [id number]  Chief Complaint  Patient presents with   Medical Management of Chronic Issues    Patient came in today for 4 month follow-up for   Pt accompanied by her daughter.  Pt is a 78 yo female with pmh sig for Arthritis, Asthma, LE edema, h/o breast cancer, Lymphedema RUE s/p R mastectomy, DM, GERD, HLD, HTN, Hypothyroid, OSA (no longer requiring CPAP), Osteoporosis, Peripheral neuropathy, Psoriasis, h/o recurrent PE on anticoag who was seen for f/u.    Chronic lymphedema RUE s/p R mastectomy.  Pt was unable to get a new compression sleeve for R arm as the person who does the measurements was not available on the day of her referral appt.   Continued edema of b/l feet and LEs.  Taking lasix  40 mg BID.  Has appt with Nephrology Nov 25th.  Pt feeling weak like may pass out and having SOB.  If holds head down then brings back up feels dizzy.  Endorses lower heart rate and being told she was anemic recently by Endo.  Seen by heme/onc yearly.  On iron.  Denies overt bleeding.  Has had several episodes of falling back, but has been able to catch herself.  Inquires about PT.  BS good.  A1C was 6.7% on 03/14/24.  Requesting refill on clobetasol  for psoriasis.    Patient Active Problem List   Diagnosis Date Noted   Chronic pain syndrome 06/26/2023   Cervical radiculopathy 04/11/2023   Syncope 01/04/2023   Obesity (BMI 30-39.9) 01/04/2023   History of recurrent venous thromboembolism 01/04/2023   Type 2 diabetes mellitus with diabetic polyneuropathy, without long-term current use of insulin  (HCC) 01/04/2023   Hypoglycemia following gastrointestinal surgery 03/19/2018   Edema of both lower extremities due to peripheral venous insufficiency 11/20/2017   Maxillary sinusitis, acute 08/31/2017   Encounter for therapeutic drug monitoring 08/26/2013   Insomnia  05/01/2013   Long term (current) use of anticoagulants 04/15/2013   Postsurgical dumping syndrome 04/06/2013   Bilateral leg edema 04/06/2013   Reactive hypoglycemia 04/02/2013   Abdominal pain intermittent-evalulating for internal hernia 11/07/2012   S/P laparoscopic cholecystectomy 05/31/2012   Roux Y Gastric Bypass April 2011; repair [facility] 11/22/2011   Iron deficiency anemia 11/14/2011   Hypertension    Left leg paresthesias 10/27/2010   Hypothyroidism 10/27/2010   Low back pain 10/27/2010   Obstructive sleep apnea 09/29/2007   PULMONARY EMBOLISM 09/25/2007   Seasonal and perennial allergic rhinitis 09/25/2007   Allergic asthma, mild persistent, uncomplicated 09/25/2007   BREAST CANCER, HX OF 09/25/2007   Past Medical History:  Diagnosis Date   Allergy    Anemia    Arthritis    Asthma    Back pain, chronic    GETS INJECTIONS IN BACK   Blind right eye    hemorrhage   Blood transfusion    Cancer (HCC)    breast 1994   Clotting disorder    Diabetes mellitus without complication (HCC)    takes precose    Elevated liver enzymes    Gallstones    GERD (gastroesophageal reflux disease)    HX: breast cancer    Hyperlipidemia    Hypertension    Hypothyroid    Lymphedema of arm    RT   OSA (obstructive sleep apnea)    has not used in 2 years-lost weight   Osteoporosis  Peripheral neuropathy    on gabapentin    PONV (postoperative nausea and vomiting)    Psoriasis    Pulmonary embolism (HCC) 1998 / 1994 /1968   Syncope and collapse 2011   due to anemia   Past Surgical History:  Procedure Laterality Date   ABDOMINAL HYSTERECTOMY     BONE MARROW TRANSPLANT  1994   CARDIOVASCULAR STRESS TEST  05/23/2005   EF 53%   carpal tunnell     bil   cataracts     CHOLECYSTECTOMY  05/10/2012   Procedure: LAPAROSCOPIC CHOLECYSTECTOMY WITH INTRAOPERATIVE CHOLANGIOGRAM;  Surgeon: Donnice KATHEE Lunger, MD;  Location: [facility];  Service: General;  Laterality: N/A;  Laparoscopic  Cholecystectomy with Intraoperative Cholangiogram   GASTRIC BYPASS  2011   bariatric surgery   HIATAL HERNIA REPAIR     IVC filter     recurrent DVT   KNEE ARTHROTOMY  1998   MASS EXCISION Right 11/24/2016   Procedure: EXCISION RIGHT AXILLARY LESION AND CHEST WALL LESION;  Surgeon: Donnice Lunger, MD;  Location: [facility];  Service: General;  Laterality: Right;   MASTECTOMY     Right   US  ECHOCARDIOGRAPHY  10/27/08   EF 55-60%   Social History   Tobacco Use   Smoking status: Former    Current packs/day: 0.00    Average packs/day: 0.5 packs/day for 10.0 years (5.0 ttl pk-yrs)    Types: Cigarettes    Start date: 07/31/1968    Quit date: 07/31/1978    Years since quitting: 45.7   Smokeless tobacco: Never  Vaping Use   Vaping status: Never Used  Substance Use Topics   Alcohol  use: No   Drug use: No   Family History  Problem Relation Age of Onset   Sudden death Mother        car accident   Cancer Father 70       lung   Cancer Sister 36       lung cancer   Asthma Daughter    Colon cancer Neg Hx    Stomach cancer Neg Hx    Esophageal cancer Neg Hx    Colon polyps Neg Hx    Allergies  Allergen Reactions   Adhesive [Tape] Rash    ROS Negative unless stated above    Objective:     BP 122/74 (BP Location: Left Arm, Patient Position: Sitting, Cuff Size: Large)   Pulse (!) 56   Temp 97.7 F (36.5 C) (Oral)   Ht 5' 7.5 (1.715 m)   Wt 241 lb 9.6 oz (109.6 kg)   SpO2 96%   BMI 37.28 kg/m  BP Readings from Last 3 Encounters:  05/05/24 122/74  03/14/24 128/60  01/04/24 122/84   Wt Readings from Last 3 Encounters:  05/05/24 241 lb 9.6 oz (109.6 kg)  03/14/24 248 lb 3.2 oz (112.6 kg)  01/04/24 239 lb 12.8 oz (108.8 kg)      Physical Exam Constitutional:      General: She is not in acute distress.    Appearance: Normal appearance.  HENT:     Head: Normocephalic and atraumatic.     Nose: Nose normal.     Mouth/Throat:     Mouth: Mucous  membranes are moist.  Cardiovascular:     Rate and Rhythm: Normal rate and regular rhythm.     Heart sounds: Normal heart sounds. No murmur heard.    No gallop.     Comments: B/l pitting edema to knee.  Chronic venous  stasis changes of anterior  LLE. Pulmonary:     Effort: Pulmonary effort is normal. No respiratory distress.     Breath sounds: Normal breath sounds. No wheezing, rhonchi or rales.  Musculoskeletal:     Right lower leg: 1+ Edema present.     Left lower leg: 2+ Edema present.  Skin:    General: Skin is warm and dry.  Neurological:     Mental Status: She is alert and oriented to person, place, and time.        05/05/2024   10:33 AM 01/04/2024    9:47 AM 10/17/2023    3:14 PM  Depression screen PHQ 2/9  Decreased Interest 0 0 0  Down, Depressed, Hopeless 0 0 0  PHQ - 2 Score 0 0 0  Altered sleeping 1 0   Tired, decreased energy 3 1   Change in appetite  1   Feeling bad or failure about yourself  0 0   Trouble concentrating 0 0   Moving slowly or fidgety/restless 3 0   Suicidal thoughts 0 0   PHQ-9 Score 7 2   Difficult doing work/chores Not difficult at all Somewhat difficult       05/05/2024   10:34 AM 01/04/2024    9:47 AM 07/06/2023    9:49 AM 03/29/2023   10:26 AM  GAD 7 : Generalized Anxiety Score  Nervous, Anxious, on Edge 0 0 0 0  Control/stop worrying 0 0 0 1  Worry too much - different things 1 1 1 2   Trouble relaxing 0 0  0  Restless 0 0 0 0  Easily annoyed or irritable 0 0 0 1  Afraid - awful might happen 1 0 0 1  Total GAD 7 Score 2 1  5   Anxiety Difficulty Not difficult at all Not difficult at all  Somewhat difficult     No results found for any visits on 05/05/24.    Assessment & Plan:   Type 2 diabetes mellitus with diabetic polyneuropathy, without long-term current use of insulin  (HCC) -     Lipid panel; Future  Lymphedema of right arm -     Ambulatory referral to Physical Therapy -     Ambulatory referral to Home Health  Primary  hypertension -     Comprehensive metabolic panel with GFR; Future -     TSH; Future -     T4, free; Future -     Lipid panel; Future  Need for influenza vaccination -     Flu vaccine HIGH DOSE PF(Fluzone Trivalent)  Dizziness -     CBC with Differential/Platelet; Future -     TSH; Future -     T4, free; Future -     Ambulatory referral to Home Health  Bilateral lower extremity edema -     Brain natriuretic peptide; Future -     D-dimer, quantitative; Future -     Ambulatory referral to Home Health  SOB (shortness of breath) -     CBC with Differential/Platelet; Future -     Iron, TIBC and Ferritin Panel; Future -     D-dimer, quantitative; Future -     Ambulatory referral to Home Health  Weakness -     CBC with Differential/Platelet; Future -     Iron, TIBC and Ferritin Panel; Future -     D-dimer, quantitative; Future -     Ambulatory referral to Home Health  Balance problem -     CBC with  Differential/Platelet; Future -     Iron, TIBC and Ferritin Panel; Future -     Ambulatory referral to Home Health  Psoriasis -     Clobetasol  Propionate; Apply topically 2 (two) times daily.  Dispense: 120 g; Refill: 5  DM II controlled.  A1c 6.7% on 03/14/24.  Continue current medications including AacuLose 50 mg 3 times daily.  Continue follow-up with endocrinology.  Eye exam done in January 5. Foot exam up-to-date done 03/14/2024.  H/o breast cancer s/p R mastectomy with lymphedema RUE.  Previous referral for compression sleeve incomplete as person who does measurements was unavailable.  Will reach out to the referral clinic [facility], in regards to getting patient scheduled.  HTN controlled.  Continue diet changes.  Lasix  for LE edema.  Monitor renal function closely.  Continue follow-up with nephrology.  Recent SOB, weakness, dizziness, balance change possibly due to anemia.  Consider worsening of asthma, thyroid  dysfunction PE less likely as on warfarin but still to  be considered.  Obtain labs.  Continue Wixela, iron supplement, and Synthroid  112 mcg daily.  Further recs based on results.  Consider imaging- CTA head and neck if labs negative.  Referral to [facility] for PT.  Psoriasis stable.  Clobetasol  refilled.  Influenza vaccine given this visit.  Return in about 4 weeks (around 06/02/2024), or if symptoms worsen or fail to improve.   Clotilda JONELLE Single, MD

## 2024-05-06 ENCOUNTER — Ambulatory Visit

## 2024-05-06 DIAGNOSIS — Z7901 Long term (current) use of anticoagulants: Secondary | ICD-10-CM

## 2024-05-06 LAB — COMPREHENSIVE METABOLIC PANEL WITH GFR
AG Ratio: 1.4 (calc) (ref 1.0–2.5)
ALT: 10 U/L (ref 6–29)
AST: 15 U/L (ref 10–35)
Albumin: 4.1 g/dL (ref 3.6–5.1)
Alkaline phosphatase (APISO): 93 U/L (ref 37–153)
BUN/Creatinine Ratio: 17 (calc) (ref 6–22)
BUN: 19 mg/dL (ref 7–25)
CO2: 26 mmol/L (ref 20–32)
Calcium: 9.4 mg/dL (ref 8.6–10.4)
Chloride: 103 mmol/L (ref 98–110)
Creat: 1.09 mg/dL — ABNORMAL HIGH (ref 0.60–1.00)
Globulin: 3 g/dL (ref 1.9–3.7)
Glucose, Bld: 99 mg/dL (ref 65–99)
Potassium: 4.3 mmol/L (ref 3.5–5.3)
Sodium: 140 mmol/L (ref 135–146)
Total Bilirubin: 0.3 mg/dL (ref 0.2–1.2)
Total Protein: 7.1 g/dL (ref 6.1–8.1)
eGFR: 53 mL/min/1.73m2 — ABNORMAL LOW (ref >=60)

## 2024-05-06 LAB — LIPID PANEL
Cholesterol: 195 mg/dL (ref <200)
HDL: 94 mg/dL (ref >=50)
LDL Cholesterol (Calc): 87 mg/dL
Non-HDL Cholesterol (Calc): 101 mg/dL (ref <130)
Total CHOL/HDL Ratio: 2.1 (calc) (ref <5.0)
Triglycerides: 58 mg/dL (ref <150)

## 2024-05-06 LAB — TSH: TSH: 1.2 m[IU]/L (ref 0.40–4.50)

## 2024-05-06 LAB — CBC WITH DIFFERENTIAL/PLATELET
Absolute Lymphocytes: 1328 {cells}/uL (ref 850–3900)
Absolute Monocytes: 212 {cells}/uL (ref 200–950)
Basophils Absolute: 52 {cells}/uL (ref 0–200)
Basophils Relative: 1.3 %
Eosinophils Absolute: 52 {cells}/uL (ref 15–500)
Eosinophils Relative: 1.3 %
HCT: 38 % (ref 35.0–45.0)
Hemoglobin: 12 g/dL (ref 11.7–15.5)
MCH: 27.6 pg (ref 27.0–33.0)
MCHC: 31.6 g/dL — ABNORMAL LOW (ref 32.0–36.0)
MCV: 87.4 fL (ref 80.0–100.0)
MPV: 11.5 fL (ref 7.5–12.5)
Monocytes Relative: 5.3 %
Neutro Abs: 2356 {cells}/uL (ref 1500–7800)
Neutrophils Relative %: 58.9 %
Platelets: 165 Thousand/uL (ref 140–400)
RBC: 4.35 Million/uL (ref 3.80–5.10)
RDW: 14 % (ref 11.0–15.0)
Total Lymphocyte: 33.2 %
WBC: 4 Thousand/uL (ref 3.8–10.8)

## 2024-05-06 LAB — T4, FREE: Free T4: 1.3 ng/dL (ref 0.8–1.8)

## 2024-05-06 LAB — IRON,TIBC AND FERRITIN PANEL
%SAT: 22 % (ref 16–45)
Ferritin: 385 ng/mL — ABNORMAL HIGH (ref 16–288)
Iron: 77 ug/dL (ref 45–160)
TIBC: 346 ug/dL (ref 250–450)

## 2024-05-06 LAB — BRAIN NATRIURETIC PEPTIDE: Brain Natriuretic Peptide: 239 pg/mL — ABNORMAL HIGH (ref <100)

## 2024-05-06 LAB — D-DIMER, QUANTITATIVE: D-Dimer, Quant: 0.5 ug{FEU}/mL — ABNORMAL HIGH (ref <0.50)

## 2024-05-06 LAB — POCT INR: INR: 2.8 (ref 2.0–3.0)

## 2024-05-06 NOTE — Progress Notes (Addendum)
 Continue 2 tablets daily except take 1 1/2 tablets on Tuesday and Saturday. Recheck in 6 weeks.  Medical screening examination/treatment/procedure(s) were performed by non-physician practitioner and as supervising physician I was immediately available for consultation/collaboration.  I agree with above. Karlynn Noel, MD

## 2024-05-06 NOTE — Patient Instructions (Addendum)
 Pre visit review using our clinic review tool, if applicable. No additional management support is needed unless otherwise documented below in the visit note.  Continue 2 tablets daily except take 1 1/2 tablets on Tuesday and Saturday. Recheck in 6 weeks.

## 2024-05-19 ENCOUNTER — Telehealth: Payer: Self-pay

## 2024-05-19 NOTE — Telephone Encounter (Signed)
 Copied from CRM #[id number]. Topic: General - Other >> May 19, 2024  1:28 PM Essie A wrote: Reason for CRM: Patient received a call from someone in the office regarding her appointment with Dr. Neysa tomorrow.  She is supposed to use a CPAP but she doesn't use one.  Please call her back at [phone]   I called and spoke to the pt.  Pt was contacted regarding CPAP machine. Pt states she never received machine so she isnt using one.   Amy I am routing this to you, as I believe you wouldve been the one to contact the patient!

## 2024-05-19 NOTE — Progress Notes (Unsigned)
 Subjective:    Patient ID: Kelli Martinez, female    DOB: [birth year], 78 y.o.   MRN: [id number]  HPI F former smoker followed for OSA/ quit CPAP, allergic rhinitis,asthma,  hx pulm embolism/DVT/filter/coumadin (Dr Von), hx R breast Ca/ mastectomy/ xrt/chem/bonemarrow transplant at [facility], hx gastric bypass, DM 2 Office Spirometry 04/27/2015-WNL-FEV1/FVC 0.81, FEV1 2.50/110% HST- 12/31/19- AHI 9.1/ hr, desaturation to 88%, body weight 219 lbs ----------------------------------------------------------------------------------------  09/18/23- 77 yoF former smoker followed for Allergic Rhinitis, Asthma, Radiation Fibrosis, hxOSA/ quit CPAP, insomnia,   hx pulm embolism/DVT/filter/coumadin (Dr Von), hx R breast Ca/ mastectomy/ xrt/chem/bone marrow transplant at [facility], hx gastric bypass, DM 2, Chronic peripheral Edema, Dr Von manages peripheral edema. HST 11/02/22- AHI 4.6/hr, desat to 77%, body weight 219 lbs WNL -Albuterol  hfa, Advair 100, Flonase , temazepam  15,  Body weight today-235 lbs Had flu vax Postnasl drip using flonase . Minimal to no cough or wheeze using Advair.  Had EGD finding yeast, attributed to her steroid inhalers. We will try switching (to reduce steroid exposure) to an antihistamine for postnasal drip, and samples Spiriva 1.25. Call for Rx if she likes Spiriva ok. Discussed HST- doesn't qualify now for CPAP. We are refilling Temazepam  15. CT 01/04/23 Lungs/Pleura: Fibrotic changes are noted at the right lung apex. There is bronchiectasis with fibrotic changes in the anterior aspect of the right upper and middle lobes, likely related to radiation therapy changes. Atelectasis is noted bilaterally. There is a subpleural nodule in the left upper lobe measuring 3 mm, unchanged from 201   05/20/24- 78 yoF former smoker followed for Allergic Rhinitis, Asthma, Radiation Fibrosis, hxOSA/ quit CPAP, insomnia,   hx pulm embolism/DVT/filter/coumadin (Dr Von), hx R breast Ca/  mastectomy/ xrt/chem/bone marrow transplant at [facility], hx gastric bypass, DM 2, Chronic peripheral Edema, Dr Von manages peripheral edema. Had EGD finding yeast, attributed to her steroid inhalers.  -Temazepam  15,  no inhalers?    Review of Systems- see HPI + = positive Constitutional:   No-   weight loss, night sweats, fevers, chills, fatigue, lassitude. HEENT:   No-  headaches, difficulty swallowing, tooth/dental problems, sore throat,   +dry mouth      No-  sneezing, itching, ear ache,  +nasal congestion, +post nasal drip, +blind R eye CV:  No-   chest pain, orthopnea, PND, swelling in lower extremities, anasarca, dizziness, palpitations Resp: No-   shortness of breath with exertion or at rest.              No-   productive cough,   non-productive cough,  No-  coughing up of blood.              No-   change in color of mucus.   Skin: No-   rash or lesions. GI:  No-   heartburn, indigestion, abdominal pain, nausea, vomiting,  GU:  MS:  No-   joint pain,    swelling.   Neuro- : +HPI Psych:  No- change in mood or affect. No depression or anxiety.  No memory loss.   Objective:   Physical Exam General- Alert, Oriented, Affect-appropriate, Distress- none acute  + obese( most of weight is in her legs and hips) Skin- rash-none, lesions- none, excoriation- none. XRT skin changes right lateral neck Lymphadenopathy- none Head- atraumatic            Eyes- +blind R eye,  clear conjunctivae            Ears- Hearing, canals, TMs normal  Nose- Septal dev, + sticky mucus, polyps, erosion, perforation             Throat- Mallampati III , mucosa clear , drainage- none, tonsils- atrophic, +missing teeth Neck- flexible , trachea midline, no stridor , thyroid  nl, carotid no bruit Chest - symmetrical excursion , unlabored           Heart/CV- RRR , no murmur , no gallop  , no rub, nl s1 s2                           - JVD+1, edema+1, stasis changes- , varices- none           Lung- clear to  P&A, wheeze-none, cough- none , dullness-none, rub- none           Chest wall- + right mastectomy Abd-  Br/ Gen/ Rectal- Not done, not indicated Extrem- cyanosis- none, clubbing- none, atrophy- none, strength- nl   Heavy legs. + cane Neuro- grossly intact to observation  Assessment & Plan:

## 2024-05-20 ENCOUNTER — Ambulatory Visit (INDEPENDENT_AMBULATORY_CARE_PROVIDER_SITE_OTHER)

## 2024-05-20 ENCOUNTER — Ambulatory Visit: Admitting: Internal Medicine

## 2024-05-20 ENCOUNTER — Other Ambulatory Visit: Payer: Self-pay | Admitting: Family Medicine

## 2024-05-20 ENCOUNTER — Other Ambulatory Visit: Payer: Self-pay | Admitting: Internal Medicine

## 2024-05-20 ENCOUNTER — Encounter: Payer: Self-pay | Admitting: Internal Medicine

## 2024-05-20 VITALS — BP 132/64 | HR 67 | Temp 97.7°F | Ht 67.5 in | Wt 239.4 lb

## 2024-05-20 DIAGNOSIS — Z9884 Bariatric surgery status: Secondary | ICD-10-CM

## 2024-05-20 DIAGNOSIS — R600 Localized edema: Secondary | ICD-10-CM | POA: Diagnosis not present

## 2024-05-20 DIAGNOSIS — R053 Chronic cough: Secondary | ICD-10-CM

## 2024-05-20 DIAGNOSIS — N189 Chronic kidney disease, unspecified: Secondary | ICD-10-CM

## 2024-05-20 DIAGNOSIS — R062 Wheezing: Secondary | ICD-10-CM | POA: Diagnosis not present

## 2024-05-20 DIAGNOSIS — Z87891 Personal history of nicotine dependence: Secondary | ICD-10-CM

## 2024-05-20 DIAGNOSIS — Z853 Personal history of malignant neoplasm of breast: Secondary | ICD-10-CM

## 2024-05-20 DIAGNOSIS — K743 Primary biliary cirrhosis: Secondary | ICD-10-CM

## 2024-05-20 MED ORDER — SPIRIVA RESPIMAT 1.25 MCG/ACT IN AERS: 2.0000 | INHALATION_SPRAY | Freq: Every day | RESPIRATORY_TRACT | 4 refills | Status: AC

## 2024-05-20 NOTE — Patient Instructions (Addendum)
 Spiriva refilled  Order- CXR    dx cough chroni

## 2024-05-21 ENCOUNTER — Ambulatory Visit: Payer: Self-pay | Admitting: Internal Medicine

## 2024-05-21 NOTE — Progress Notes (Signed)
 Called and spoke to pt - advised of CXR results per Dr. Neysa. Pt verbalized understanding, NFN.

## 2024-05-23 ENCOUNTER — Encounter: Payer: Self-pay | Admitting: Internal Medicine

## 2024-05-25 ENCOUNTER — Ambulatory Visit: Payer: Self-pay | Admitting: Family Medicine

## 2024-05-25 DIAGNOSIS — R7989 Other specified abnormal findings of blood chemistry: Secondary | ICD-10-CM

## 2024-05-25 DIAGNOSIS — R600 Localized edema: Secondary | ICD-10-CM

## 2024-05-25 DIAGNOSIS — R0602 Shortness of breath: Secondary | ICD-10-CM

## 2024-05-25 DIAGNOSIS — I358 Other nonrheumatic aortic valve disorders: Secondary | ICD-10-CM

## 2024-05-27 ENCOUNTER — Ambulatory Visit (HOSPITAL_COMMUNITY)
Admission: RE | Admit: 2024-05-27 | Discharge: 2024-05-27 | Disposition: A | Discharge status: Home / Self Care | Source: Ambulatory Visit | Attending: Family Medicine | Admitting: Family Medicine

## 2024-05-27 DIAGNOSIS — R0602 Shortness of breath: Secondary | ICD-10-CM | POA: Diagnosis not present

## 2024-05-27 DIAGNOSIS — R7989 Other specified abnormal findings of blood chemistry: Secondary | ICD-10-CM | POA: Diagnosis present

## 2024-05-27 DIAGNOSIS — R600 Localized edema: Secondary | ICD-10-CM | POA: Insufficient documentation

## 2024-05-27 DIAGNOSIS — I358 Other nonrheumatic aortic valve disorders: Secondary | ICD-10-CM | POA: Insufficient documentation

## 2024-05-27 LAB — ECHOCARDIOGRAM COMPLETE
AR max vel: 1.82 cm2
AV Area VTI: 1.66 cm2
AV Area mean vel: 1.69 cm2
AV Mean grad: 5.8 mmHg
AV Peak grad: 10.7 mmHg
Ao pk vel: 1.63 m/s
Area-P 1/2: 3.39 cm2
MV M vel: 4.97 m/s
MV Peak grad: 98.8 mmHg
Radius: 0.63 cm
S' Lateral: 2.8 cm

## 2024-05-28 ENCOUNTER — Ambulatory Visit: Payer: Self-pay | Admitting: Family Medicine

## 2024-06-09 ENCOUNTER — Ambulatory Visit: Admitting: Student in an Organized Health Care Education/Training Program

## 2024-06-10 NOTE — Progress Notes (Signed)
 Cardiology Office Note:   Date:  06/10/2024  ID:  Kelli Martinez, DOB 09-Dec-1945, MRN [id number] PCP: [name], MD  [facility] Providers Cardiologist:  Georganna Archer, MD { Chief Complaint:  Chief Complaint  Patient presents with   Chest Pain      History of Present Illness:   Kelli Martinez is a 78 y.o. female with a PMH of HTN, HLD, DM2, OSA, DVT/recurrent PE s/p IVC filter (on warfarin), chronic lymphedema, prior tobacco use, PBC, breast CA s/p chemo and BMT, and gastric bypass surgery who presents for follow up.  Last seen in clinic by Dr. Alvan in 2023 for swelling.  At that time it was felt that her swelling was all from lymphedema and extravascular rather than cardiovascular in etiology.  Patient was recently seen by pulmonology and she was encouraged to reestablish care with cardiology given ongoing swelling.  Today the patient shares with me that she has had intermittent episodes of chest pain.  She describes the pains as aching that shoot across her chest and radiate to her neck, left arm and back.  Her last episode of chest pain was yesterday and these occur at rest.  She is not physically active so she is not sure if there is any exertional component to her pain.  Before yesterday she had a similar episode that occurred 2 weeks ago, also at rest.  Additionally she has other longstanding issues, but her most concerning is her lower extremity and RUE swelling.  She has chronic lymphedema and has noted persistence of her swelling.  She is unable to wrap her legs due to their size.  She has been previously seen in lymphedema clinic; however, she states that they only wrapped her upper extremity and not her lower extremity.  She lives in [place] and thus has issues with accessing certain farther out clinics.  She states that she has had recurrent DVTs/PEs dating back to the 1990s.  She has been on warfarin since this time.  I asked her if she  has any history of genetic or acquired hypercoagulable diseases such as APLS or factor V Leiden and the patient said no.  She states that she is only ever been on warfarin and has never tried a DOAC.  Lastly the patient takes pravastatin  for cholesterol, but states that she never had intolerances to other statins.  She has just been on this 1 for a long period of time.   Past Medical History:  Diagnosis Date   Allergy    Anemia    Arthritis    Asthma    Back pain, chronic    GETS INJECTIONS IN BACK   Blind right eye    hemorrhage   Blood transfusion    Blood transfusion without reported diagnosis    Breast cancer (HCC) 93-94   Had a bone marrow transplant   Cancer (HCC)    breast 1994   Clotting disorder    Diabetes mellitus without complication (HCC)    takes precose    Elevated liver enzymes    Gallstones    GERD (gastroesophageal reflux disease)    HX: breast cancer    Hyperlipidemia    Hypertension    Hypothyroid    Lymphedema of arm    RT   OSA (obstructive sleep apnea)    has not used in 2 years-lost weight   Osteoporosis    Peripheral neuropathy    on gabapentin    PONV (postoperative nausea and vomiting)  Psoriasis    Pulmonary embolism (HCC) 1998 / 1994 /1968   Skin cancer 3 or 4 years ago   Two moles on my right foot   Syncope and collapse 2011   due to anemia     Studies Reviewed:    EKG:  EKG Interpretation Date/Time:  Wednesday June 11 2024 09:11:43 EST Ventricular Rate:  51 PR Interval:  232 QRS Duration:  134 QT Interval:  466 QTC Calculation: 429 R Axis:   38  Text Interpretation: Sinus bradycardia with 1st degree A-V block with Premature supraventricular complexes Right bundle branch block When compared with ECG of 04-Jan-2023 01:05, PREVIOUS ECG IS PRESENT Confirmed by Floretta Mallard [phone]) on 06/11/2024 9:22:33 AM     Cardiac Studies & Procedures    ______________________________________________________________________________________________     ECHOCARDIOGRAM  ECHOCARDIOGRAM COMPLETE 05/27/2024  Narrative ECHOCARDIOGRAM REPORT    Patient Name:   Kelli Martinez Date of Exam: 05/27/2024 Medical Rec #:  [id number]                 Height:       67.5 in Accession #:    [id number]                Weight:       239.4 lb Date of Birth:  03/18/1946                 BSA:          2.195 m Patient Age:    78 years                  BP:           132/64 mmHg Patient Gender: F                         HR:           55 bpm. Exam Location:  [address]  Procedure: 2D Echo, 3D Echo, Cardiac Doppler, Color Doppler and Strain Analysis (Both Spectral and Color Flow Doppler were utilized during procedure).  Indications:    R06.00 Dyspnea  History:        Patient has prior history of Echocardiogram examinations, most recent 08/25/2021. Aortic Valve Disease, Signs/Symptoms:Shortness of Breath, Dyspnea, Syncope, Edema and Dizziness/Lightheadedness; Risk Factors:Sleep Apnea, Hypertension, Diabetes, Dyslipidemia and Former Smoker. Aortic Sclerosis w/o Stenosis, Right Breast Cancer status post Mastectomy (1993/1994), Elevated BNP, Clotting Disorder with History of DVT with Multiple Pulmonary Emboli (status post IVC Filter).  Sonographer:    Heather Hawks RDCS Referring Phys: CLOTILDA JONELLE SINGLE  IMPRESSIONS   1. Left ventricular ejection fraction, by estimation, is 65 to 70%. The left ventricle has normal function. The left ventricle has no regional wall motion abnormalities. Left ventricular diastolic parameters are indeterminate. The average left ventricular global longitudinal strain is -25.4 %. The global longitudinal strain is normal. 2. Right ventricular systolic function is normal. The right ventricular size is normal. There is normal pulmonary artery systolic pressure. 3. Left atrial size was moderately dilated. 4. The  mitral valve is normal in structure. Mild to moderate mitral valve regurgitation. No evidence of mitral stenosis. 5. Tricuspid valve regurgitation is mild to moderate. 6. The aortic valve is calcified. Aortic valve regurgitation is not visualized. Aortic valve sclerosis/calcification is present, without any evidence of aortic stenosis. Aortic valve area, by VTI measures 1.66 cm. Aortic valve mean gradient measures 5.8 mmHg. Aortic valve Vmax measures 1.63 m/s. 7. The inferior vena  cava is normal in size with greater than 50% respiratory variability, suggesting right atrial pressure of 3 mmHg.  FINDINGS Left Ventricle: Left ventricular ejection fraction, by estimation, is 65 to 70%. The left ventricle has normal function. The left ventricle has no regional wall motion abnormalities. The average left ventricular global longitudinal strain is -25.4 %. Strain was performed and the global longitudinal strain is normal. 3D ejection fraction reviewed and evaluated as part of the interpretation. Alternate measurement of EF is felt to be most reflective of LV function. The left ventricular internal cavity size was normal in size. There is no left ventricular hypertrophy. Left ventricular diastolic parameters are indeterminate. Indeterminate filling pressures.  Right Ventricle: The right ventricular size is normal. No increase in right ventricular wall thickness. Right ventricular systolic function is normal. There is normal pulmonary artery systolic pressure. The tricuspid regurgitant velocity is 2.22 m/s, and with an assumed right atrial pressure of 3 mmHg, the estimated right ventricular systolic pressure is 22.7 mmHg.  Left Atrium: Left atrial size was moderately dilated.  Right Atrium: Right atrial size was normal in size.  Pericardium: There is no evidence of pericardial effusion.  Mitral Valve: The mitral valve is normal in structure. Mild to moderate mitral annular calcification. Mild to moderate  mitral valve regurgitation. No evidence of mitral valve stenosis.  Tricuspid Valve: The tricuspid valve is normal in structure. Tricuspid valve regurgitation is mild to moderate. No evidence of tricuspid stenosis.  Aortic Valve: The aortic valve is calcified. Aortic valve regurgitation is not visualized. Aortic valve sclerosis/calcification is present, without any evidence of aortic stenosis. Aortic valve mean gradient measures 5.8 mmHg. Aortic valve peak gradient measures 10.7 mmHg. Aortic valve area, by VTI measures 1.66 cm.  Pulmonic Valve: The pulmonic valve was normal in structure. Pulmonic valve regurgitation is mild. No evidence of pulmonic stenosis.  Aorta: The aortic root is normal in size and structure.  Venous: The inferior vena cava is normal in size with greater than 50% respiratory variability, suggesting right atrial pressure of 3 mmHg.  IAS/Shunts: No atrial level shunt detected by color flow Doppler.  Additional Comments: 3D was performed not requiring image post processing on an independent workstation and was normal. A device lead is visualized.   LEFT VENTRICLE PLAX 2D LVIDd:         4.65 cm   Diastology LVIDs:         2.80 cm   LV e' medial:    7.93 cm/s LV PW:         0.80 cm   LV E/e' medial:  11.9 LV IVS:        0.65 cm   LV e' lateral:   12.10 cm/s LVOT diam:     2.00 cm   LV E/e' lateral: 7.8 LV SV:         73 LV SV Index:   33        2D Longitudinal Strain LVOT Area:     3.14 cm  2D Strain GLS (A4C):   -27.4 % LV IVRT:       82 msec   2D Strain GLS (A3C):   -23.3 % 2D Strain GLS (A2C):   -25.6 % 2D Strain GLS Avg:     -25.4 %  3D Volume EF: 3D EF:        77 % LV EDV:       191 ml LV ESV:       45 ml LV SV:  146 ml  RIGHT VENTRICLE RV Basal diam:  4.20 cm     PULMONARY VEINS RV Mid diam:    3.90 cm     Diastolic Velocity: 45.80 cm/s RV S prime:     14.10 cm/s  S/D Velocity:       1.10 TAPSE (M-mode): 2.4 cm      Systolic Velocity:  48.30  cm/s RVSP:           22.7 mmHg  LEFT ATRIUM             Index        RIGHT ATRIUM           Index LA diam:        4.40 cm 2.00 cm/m   RA Pressure: 3.00 mmHg LA Vol (A2C):   96.7 ml 44.06 ml/m  RA Area:     19.70 cm LA Vol (A4C):   70.6 ml 32.17 ml/m  RA Volume:   58.10 ml  26.47 ml/m LA Biplane Vol: 85.9 ml 39.14 ml/m AORTIC VALVE AV Area (Vmax):    1.82 cm AV Area (Vmean):   1.69 cm AV Area (VTI):     1.66 cm AV Vmax:           163.20 cm/s AV Vmean:          111.640 cm/s AV VTI:            0.436 m AV Peak Grad:      10.7 mmHg AV Mean Grad:      5.8 mmHg LVOT Vmax:         94.40 cm/s LVOT Vmean:        60.100 cm/s LVOT VTI:          0.231 m LVOT/AV VTI ratio: 0.53  AORTA Ao Root diam: 3.10 cm Ao Asc diam:  3.25 cm  MITRAL VALVE                  TRICUSPID VALVE MV Area (PHT): cm            TR Peak grad:   19.7 mmHg MV Decel Time: 224 msec       TR Vmax:        222.00 cm/s MR Peak grad:    98.8 mmHg    Estimated RAP:  3.00 mmHg MR Mean grad:    64.0 mmHg    RVSP:           22.7 mmHg MR Vmax:         497.00 cm/s MR Vmean:        375.3 cm/s   SHUNTS MR PISA:         2.52 cm     Systemic VTI:  0.23 m MR PISA Eff ROA: 16 mm       Systemic Diam: 2.00 cm MR PISA Radius:  0.63 cm MV E velocity: 94.65 cm/s MV A velocity: 84.60 cm/s MV E/A ratio:  1.12  Annabella Scarce MD Electronically signed by Annabella Scarce MD Signature Date/Time: 05/27/2024/2:33:15 PM    Final          ______________________________________________________________________________________________      Risk Assessment/Calculations:              Physical Exam:     VS:  BP 130/85   Pulse (!) 51   Ht 5' 7 (1.702 m)   Wt 243 lb 12.8 oz (110.6 kg)   SpO2 97%   BMI 38.18 kg/m  Wt Readings from Last 3 Encounters:  05/20/24 239 lb 6.4 oz (108.6 kg)  05/05/24 241 lb 9.6 oz (109.6 kg)  03/14/24 248 lb 3.2 oz (112.6 kg)     GEN: Well nourished, well developed, in no acute  distress, very pleasant NECK: No JVD; No carotid bruits CARDIAC: RRR, no murmurs, rubs, gallops RESPIRATORY:  Clear to auscultation without rales, wheezing or rhonchi  ABDOMEN: Soft, non-tender, non-distended, normal bowel sounds EXTREMITIES: Very large bilateral lower extremities, 3+ pitting edema bilaterally with bullous skin changes in the LLE, RUE is also swollen with lymphedema, WWP PSYCH: Normal mood and affect   Assessment & Plan Precordial pain - Patient having intermittent episodes of chest pain that are concerning for possible coronary origin. -We will pursue a coronary CTA for further evaluation. Coronary CTA Follow-up in 3 months Chronic acquired lymphedema - Patient has marked bilateral lower extremity edema from lymphedema. -Is unclear to me how much of her swelling is contributed by possible diastolic dysfunction.  She did note having pulmonary edema on a CXR recently which would not be from lymphedema. -She has no JVD on exam and her lungs sound clear today so I suspect she is probably intravascularly euvolemic, but it is really hard to tell. -I mention possibly doing a RHC on her to better understand her filling pressures; however, I do not recommend this at this time given that she is not terribly symptomatic and will be technically challenging given her right upper extremity swelling. -I will empirically increase her diuretics and change her to torsemide 40 mg twice daily from Lasix  40 mg twice daily. -Ultimately though I think it is imperative that she gets into the lymphedema clinic and have her legs wrapped consistently as this is the only true treatment for lymphedema. Stop Lasix  Start torsemide 40 mg twice daily Refer to lymphedema clinic BMP, magnesium  in 1 week Mixed hyperlipidemia - Her last LDL was above her goal of 70 while on pravastatin . -I will switch her to Crestor 20 mg daily for better control. Stop pravastatin  Start Crestor 20 mg daily Chronic  pulmonary embolism, unspecified pulmonary embolism type, unspecified whether acute cor pulmonale present (HCC) - The patient has been on warfarin for decades in the setting of recurrent DVTs and PEs. - From the patient's history he seemed to have been in the setting of both pregnancy and cancer. - She does not appear to have a history of a specific clotting disorder from what I can see in the chart. - I do not see a strong indication for her to stay on warfarin as opposed to a DOAC given that she did not have breakthrough clotting on a DOAC. - The patient is amenable to trying Eliquis.  I discussed with pharmacy and the plan will be to hold warfarin for 2 days and then start Eliquis thereafter. Hold warfarin for 2 days then discontinue Start Eliquis 5 mg twice daily          This note was written with the assistance of a dictation microphone or AI dictation software. Please excuse any typos or grammatical errors.   Signed, Georganna Archer, MD 06/10/2024 9:47 PM     [facility]

## 2024-06-11 ENCOUNTER — Ambulatory Visit
Attending: Student in an Organized Health Care Education/Training Program | Admitting: Student in an Organized Health Care Education/Training Program

## 2024-06-11 ENCOUNTER — Other Ambulatory Visit (HOSPITAL_COMMUNITY): Payer: Self-pay

## 2024-06-11 ENCOUNTER — Other Ambulatory Visit: Payer: Self-pay | Admitting: *Deleted

## 2024-06-11 VITALS — BP 130/85 | HR 51 | Ht 67.0 in | Wt 243.8 lb

## 2024-06-11 DIAGNOSIS — E782 Mixed hyperlipidemia: Secondary | ICD-10-CM | POA: Diagnosis not present

## 2024-06-11 DIAGNOSIS — I2782 Chronic pulmonary embolism: Secondary | ICD-10-CM | POA: Diagnosis not present

## 2024-06-11 DIAGNOSIS — I358 Other nonrheumatic aortic valve disorders: Secondary | ICD-10-CM | POA: Diagnosis present

## 2024-06-11 DIAGNOSIS — I872 Venous insufficiency (chronic) (peripheral): Secondary | ICD-10-CM

## 2024-06-11 DIAGNOSIS — R0602 Shortness of breath: Secondary | ICD-10-CM | POA: Insufficient documentation

## 2024-06-11 DIAGNOSIS — R600 Localized edema: Secondary | ICD-10-CM | POA: Insufficient documentation

## 2024-06-11 DIAGNOSIS — I890 Lymphedema, not elsewhere classified: Secondary | ICD-10-CM | POA: Diagnosis present

## 2024-06-11 DIAGNOSIS — R072 Precordial pain: Secondary | ICD-10-CM | POA: Insufficient documentation

## 2024-06-11 MED ORDER — TORSEMIDE 20 MG PO TABS: 40.0000 mg | ORAL_TABLET | Freq: Two times a day (BID) | ORAL | 2 refills | Status: AC

## 2024-06-11 MED ORDER — APIXABAN 5 MG PO TABS
5.0000 mg | ORAL_TABLET | Freq: Two times a day (BID) | ORAL | 6 refills | Status: DC
  Filled 2024-06-11: qty 60, 30d supply, fill #0

## 2024-06-11 MED ORDER — ROSUVASTATIN CALCIUM 20 MG PO TABS: 20.0000 mg | ORAL_TABLET | Freq: Every day | ORAL | 2 refills | Status: DC

## 2024-06-11 NOTE — Patient Instructions (Addendum)
 Medication Instructions:  Your physician has recommended you make the following change in your medication: Stop furosemide  Start torsemide 40 mg by mouth twice daily  Stop Pravastatin  Start Rosuvastatin 20 mg by mouth daily  Change from Warfarin to Eliquis.  To make this change stop warfarin for 2 days and then start Eliquis 5 mg by mouth twice daily  *If you need a refill on your cardiac medications before your next appointment, please call your pharmacy*  Lab Work: Have lab work checked in one week can be done at any [facility] location--BMET and Magnesium  If you have labs (blood work) drawn today and your tests are completely normal, you will receive your results only by: MyChart Message (if you have MyChart) OR A paper copy in the mail If you have any lab test that is abnormal or we need to change your treatment, we will call you to review the results.  Testing/Procedures: Your physician has requested that you have cardiac CT. Cardiac computed tomography (CT) is a painless test that uses an x-ray machine to take clear, detailed pictures of your heart. For further information please visit [email or web address] Please follow instruction sheet as given.    Follow-Up: At [facility], you and your health needs are our priority.  As part of our continuing mission to provide you with exceptional heart care, our providers are all part of one team.  This team includes your primary Cardiologist (physician) and Advanced Practice Providers or APPs (Physician Assistants and Nurse Practitioners) who all work together to provide you with the care you need, when you need it.  Your next appointment:   3 month(s)  Provider:   Georganna Archer, MD    We recommend signing up for the patient portal called MyChart.  Sign up information is provided on this After Visit Summary.  MyChart is used to connect with patients for Virtual Visits (Telemedicine).  Patients are able to view lab/test results,  encounter notes, upcoming appointments, etc.  Non-urgent messages can be sent to your provider as well.   To learn more about what you can do with MyChart, go to [email or web address].   Other Instructions    Your cardiac CT will be scheduled at one of the below locations:   [facility] [address] [place], [place] [address] [phone] (Severe contrast allergies only)  OR   [facility] [address] [place], [place] [address] [phone]  OR   [facility] [address] [place], [place] [address] [phone]  OR   [facility] [address]  [place], [place] [address]  OR   [facility] [address] [place], [place] [phone]  If scheduled at [facility], please arrive at the [facility] and Children's Entrance (Entrance C2) of [facility] 30 minutes prior to test start time. You can use the FREE valet parking offered at entrance C (encouraged to control the heart rate for the test)  Proceed to the [facility] Radiology Department (first floor) to check-in and test prep.  All radiology patients and guests should use entrance C2 at [facility], accessed from [facility], even though the hospital's physical address listed is [address].  If scheduled at the Heart and Vascular Tower at [address], please enter the parking lot using the [address] entrance and use the FREE valet service at the patient drop-off area. Enter the building and check-in with registration  on the main floor.  If scheduled at [facility], please arrive to the Heart and Vascular Center 15 mins early for check-in and test prep.  There is spacious parking and easy access to the radiology department from the [facility] Heart and Vascular entrance. Please enter here and check-in with the desk attendant.   If scheduled at [facility], please arrive 30 minutes early for check-in and test prep.  Please follow these instructions carefully (unless otherwise directed):  An IV will be required for this test and Nitroglycerin will be given.  Hold all erectile dysfunction medications at least 3 days (72 hrs) prior to test. (Ie viagra, cialis, sildenafil, tadalafil, etc)   On the Night Before the Test: Be sure to Drink plenty of water. Do not consume any caffeinated/decaffeinated beverages or chocolate 12 hours prior to your test. Do not take any antihistamines 12 hours prior to your test. .  On the Day of the Test: Drink plenty of water until 1 hour prior to the test. Do not eat any food 1 hour prior to test. You may take your regular medications prior to the test.   If you take Furosemide /Hydrochlorothiazide/Spironolactone /Chlorthalidone, please HOLD on the morning of the test.  Do not take torsemide the morning of the test Patients who wear a continuous glucose monitor MUST remove the device prior to scanning. FEMALES- please wear underwire-free bra if available, avoid dresses & tight clothing        After the Test: Drink plenty of water. After receiving IV contrast, you may experience a mild flushed feeling. This is normal. On occasion, you may experience a mild rash up to 24 hours after the test. This is not dangerous. If this occurs, you can take Benadryl 25 mg, Zyrtec, Claritin , or Allegra and increase your fluid intake. (Patients taking Tikosyn should avoid Benadryl, and may take Zyrtec, Claritin , or Allegra) If you experience trouble breathing, this can be serious. If it is severe call 911 IMMEDIATELY. If it is mild, please call our office.  We will call to schedule your test 2-4 weeks out understanding that some insurance companies will need an authorization prior to the service being performed.   For more information and frequently asked questions, please visit our website :  [email or web address]  For non-scheduling related questions, please contact the cardiac imaging nurse navigator should you have any questions/concerns: Cardiac Imaging Nurse Navigators Direct Office Dial: [phone]   For scheduling needs, including cancellations and rescheduling, please call Brittany, [phone].     We will try and see if we can locate a lymphedema clinic closer to where you live

## 2024-06-12 ENCOUNTER — Other Ambulatory Visit: Payer: Self-pay

## 2024-06-12 DIAGNOSIS — K743 Primary biliary cirrhosis: Secondary | ICD-10-CM

## 2024-06-12 DIAGNOSIS — Z9884 Bariatric surgery status: Secondary | ICD-10-CM

## 2024-06-12 MED ORDER — URSODIOL 300 MG PO CAPS
600.0000 mg | ORAL_CAPSULE | Freq: Two times a day (BID) | ORAL | 2 refills | Status: AC
Start: 2024-06-12 — End: ?

## 2024-06-12 NOTE — Progress Notes (Signed)
Fax request received.

## 2024-06-16 ENCOUNTER — Telehealth: Payer: Self-pay

## 2024-06-16 ENCOUNTER — Ambulatory Visit: Payer: Self-pay

## 2024-06-16 NOTE — Telephone Encounter (Signed)
 Pt reports she had a cardiology apt on 11/12 and was transitioned to Eliquis. Cancelled coumadin  clinic apt for tomorrow. Advised pt to f/u with coumadin  clinic if anything is needed in the future. Pt verbalized understanding and voiced appreciation for the care she has received.   Resolved anticoagulation encounters.

## 2024-06-17 ENCOUNTER — Ambulatory Visit

## 2024-06-17 ENCOUNTER — Ambulatory Visit: Admitting: Endocrinology

## 2024-06-17 ENCOUNTER — Encounter: Payer: Self-pay | Admitting: Endocrinology

## 2024-06-17 ENCOUNTER — Ambulatory Visit: Payer: Self-pay | Admitting: Endocrinology

## 2024-06-17 VITALS — BP 118/60 | HR 80 | Ht 67.0 in | Wt 241.8 lb

## 2024-06-17 DIAGNOSIS — Z7984 Long term (current) use of oral hypoglycemic drugs: Secondary | ICD-10-CM | POA: Diagnosis not present

## 2024-06-17 DIAGNOSIS — Z9889 Other specified postprocedural states: Secondary | ICD-10-CM

## 2024-06-17 DIAGNOSIS — E1142 Type 2 diabetes mellitus with diabetic polyneuropathy: Secondary | ICD-10-CM | POA: Diagnosis not present

## 2024-06-17 DIAGNOSIS — E063 Autoimmune thyroiditis: Secondary | ICD-10-CM

## 2024-06-17 DIAGNOSIS — K912 Postsurgical malabsorption, not elsewhere classified: Secondary | ICD-10-CM

## 2024-06-17 LAB — POCT GLYCOSYLATED HEMOGLOBIN (HGB A1C): Hemoglobin A1C: 6.7 % — AB (ref 4.0–5.6)

## 2024-06-17 MED ORDER — LEVOTHYROXINE SODIUM 112 MCG PO TABS: 112.0000 ug | ORAL_TABLET | Freq: Every day | ORAL | 3 refills | Status: AC

## 2024-06-17 NOTE — Progress Notes (Signed)
 Outpatient Endocrinology Note Kelli Bartus, MD  06/17/24  Patient's Name: Kelli Martinez    DOB: 1946/01/19    MRN: [id number]                                                    REASON OF VISIT: Follow up for type 2 diabetes mellitus / hypothyroidism  PCP: [name], MD  HISTORY OF PRESENT ILLNESS:   Kelli Martinez is a 78 y.o. old female with past medical history listed below, is here for follow up of type 2 diabetes mellitus and hypoglycemia / hypothyroidism.   Pertinent Diabetes History: Patient was diagnosed with type 2 diabetes mellitus several years ago, has mild and well-controlled since her gastric bypass surgery in 2011.  She has postprandial hypoglycemia related with dumping syndrome.  Chronic Diabetes Complications : Retinopathy: no. Last ophthalmology exam was done on annually reportedly. Nephropathy: CKD IIIb, was referred to nephrology. Peripheral neuropathy: yes, on burning, numbness, on gabapentin  and nortriptyline , following with podiatry. Coronary artery disease: no Stroke: no  Relevant comorbidities and cardiovascular risk factors: Obesity: yes Body mass index is 37.87 kg/m.  Hypertension: yes Hyperlipidemia. Yes, on statin.   Current / Home Diabetic regimen includes: Acarbose  50 mg 3 times a day with meals.  Prior diabetic medications: Metformin  stopped due to tendency of hypoglycemia.  Glycemic data:   Not able to download glucose data in the clinic today.  Some of the blood sugars reviewed as follows, taking at different times of the day.  7 days average, 111 and other blood sugar 67,119, 103, 93, 88, 95, 87, 96, 79, 73, 133.  Hypoglycemia: Patient has no hypoglycemic episodes. Patient has hypoglycemia awareness.  Factors modifying glucose control: 1.  Diabetic diet assessment: 3 meals a day.  2.  Staying active or exercising: not able to do exercise.  3.  Medication compliance: compliant all of the time.  #  Postprandial hypoglycemia related to dumping syndrome. She had gastric bypass surgery in 2011. -She uses glucose tablet or juice to correct hypoglycemia and hypoglycemic symptoms.  Usually 1 to 2 tablets are educated. -She had seen dietitian instructed for mixed meal diet with low-fat meals enough protein consistently and restricting carbohydrates including fluids. -She did try verapamil  previously without benefit. -She is on acarbose  for hypoglycemia as well.  # Primary hypothyroidism for several years. -On Thyroid  hormone replacement/levothyroxine  112 mcg daily.    Interval history  Glucometer data as reviewed above.  Laboratory results from October reviewed normal thyroid  function test, normal electrolytes.  She has been taking acarbose .  She has been taking levothyroxine  112 mcg daily.  Denies hypoglycemic symptoms.  No hypo and hyperthyroid symptoms.  No other complaints today.   REVIEW OF SYSTEMS As per history of present illness.   PAST MEDICAL HISTORY: Past Medical History:  Diagnosis Date   Allergy    Anemia    Arthritis    Asthma    Back pain, chronic    GETS INJECTIONS IN BACK   Blind right eye    hemorrhage   Blood transfusion    Blood transfusion without reported diagnosis    Breast cancer (HCC) 93-94   Had a bone marrow transplant   Cancer (HCC)    breast 1994   Clotting disorder    Diabetes mellitus without complication (HCC)  takes precose    Elevated liver enzymes    Gallstones    GERD (gastroesophageal reflux disease)    HX: breast cancer    Hyperlipidemia    Hypertension    Hypothyroid    Lymphedema of arm    RT   OSA (obstructive sleep apnea)    has not used in 2 years-lost weight   Osteoporosis    Peripheral neuropathy    on gabapentin    PONV (postoperative nausea and vomiting)    Psoriasis    Pulmonary embolism (HCC) 1998 / 1994 /1968   Skin cancer 3 or 4 years ago   Two moles on my right foot   Syncope and collapse 2011   due to anemia     PAST SURGICAL HISTORY: Past Surgical History:  Procedure Laterality Date   ABDOMINAL HYSTERECTOMY     BONE MARROW TRANSPLANT  07/31/1992   BREAST SURGERY  94   Breast removal   CARDIOVASCULAR STRESS TEST  05/23/2005   EF 53%   carpal tunnell     bil   cataracts     CESAREAN SECTION  82   CHOLECYSTECTOMY  05/10/2012   Procedure: LAPAROSCOPIC CHOLECYSTECTOMY WITH INTRAOPERATIVE CHOLANGIOGRAM;  Surgeon: Donnice KATHEE Lunger, MD;  Location: [facility];  Service: General;  Laterality: N/A;  Laparoscopic Cholecystectomy with Intraoperative Cholangiogram   EYE SURGERY     GASTRIC BYPASS  07/31/2009   bariatric surgery   HIATAL HERNIA REPAIR     IVC filter     recurrent DVT   KNEE ARTHROTOMY  07/31/1996   MASS EXCISION Right 11/24/2016   Procedure: EXCISION RIGHT AXILLARY LESION AND CHEST WALL LESION;  Surgeon: Donnice Lunger, MD;  Location: [facility];  Service: General;  Laterality: Right;   MASTECTOMY     Right   US  ECHOCARDIOGRAPHY  10/27/2008   EF 55-60%    ALLERGIES: Allergies  Allergen Reactions   Adhesive [Tape] Rash    FAMILY HISTORY:  Family History  Problem Relation Age of Onset   Sudden death Mother        car accident   Arthritis Mother    Cancer Father 60       lung   Cancer Sister 78       lung cancer   Asthma Daughter    Miscarriages / Stillbirths Brother    Colon cancer Neg Hx    Stomach cancer Neg Hx    Esophageal cancer Neg Hx    Colon polyps Neg Hx     SOCIAL HISTORY: Social History   Socioeconomic History   Marital status: Widowed    Spouse name: Not on file   Number of children: 2   Years of education: Not on file   Highest education level: Associate degree: occupational, scientist, product/process development, or vocational program  Occupational History   Occupation: retired  Tobacco Use   Smoking status: Former    Current packs/day: 0.00    Average packs/day: 0.5 packs/day for 10.0 years (5.0 ttl pk-yrs)    Types: Cigarettes    Start date:  07/31/1968    Quit date: 07/31/1978    Years since quitting: 45.9   Smokeless tobacco: Never  Vaping Use   Vaping status: Never Used  Substance and Sexual Activity   Alcohol  use: No   Drug use: No   Sexual activity: Not Currently  Other Topics Concern   Not on file  Social History Narrative   Widowed with 2 children,  Lives in a one story home and  her son and his wife recently moved in with her.  Retired production designer, theatre/television/film for [employer].  Education: some college.    Social Drivers of Corporate Investment Banker Strain: Low Risk  (04/10/2024)   Overall Financial Resource Strain (CARDIA)    Difficulty of Paying Living Expenses: Not very hard  Food Insecurity: Food Insecurity Present (04/10/2024)   Hunger Vital Sign    Worried About Running Out of Food in the Last Year: Sometimes true    Ran Out of Food in the Last Year: Never true  Transportation Needs: No Transportation Needs (04/10/2024)   PRAPARE - Administrator, Civil Service (Medical): No    Lack of Transportation (Non-Medical): No  Physical Activity: Inactive (04/10/2024)   Exercise Vital Sign    Days of Exercise per Week: 0 days    Minutes of Exercise per Session: Not on file  Stress: No Stress Concern Present (04/10/2024)   [employer] of Occupational Health - Occupational Stress Questionnaire    Feeling of Stress: Only a little  Social Connections: Moderately Integrated (04/10/2024)   Social Connection and Isolation Panel    Frequency of Communication with Friends and Family: More than three times a week    Frequency of Social Gatherings with Friends and Family: Once a week    Attends Religious Services: More than 4 times per year    Active Member of [employer] or Organizations: Yes    Attends Banker Meetings: More than 4 times per year    Marital Status: Widowed    MEDICATIONS:  Current Outpatient Medications  Medication Sig Dispense Refill   acarbose  (PRECOSE ) 50 MG tablet Take 1 tablet (50 mg total) by mouth  3 (three) times daily. 270 tablet 3   albuterol  (PROAIR  HFA) 108 (90 Base) MCG/ACT inhaler 2 puffs daily at 6 (six) AM.     apixaban (ELIQUIS) 5 MG TABS tablet Take 1 tablet (5 mg total) by mouth 2 (two) times daily. 60 tablet 6   baclofen  (LIORESAL ) 20 MG tablet TAKE 1 TABLET THREE TIMES A DAY (UPDATED PRESCRIPTION) 270 tablet 3   Blood Glucose Monitoring Suppl (FREESTYLE FREEDOM LITE) w/Device KIT Use as directed to check blood sugar once daily. 1 kit 0   Calcium  Carb-Cholecalciferol (CALTRATE BONE HEALTH PO) Take 1 tablet by mouth daily.     Calcium  Citrate (CALCITRATE PO) Take 1 tablet by mouth daily.     clobetasol  cream (TEMOVATE ) 0.05 % Apply topically 2 (two) times daily. 120 g 5   Cyanocobalamin  (VITAMIN B-12 PO) Take 1 tablet by mouth every other day.     dorzolamide-timolol (COSOPT) 2-0.5 % ophthalmic solution Place 1 drop into the left eye 2 (two) times daily.     Elastic Bandages & Supports (TRUFORM ARM SLEEVE L 15-20MMHG) MISC 1 Device by Does not apply route as directed. 1 each 1   fluticasone  (FLONASE ) 50 MCG/ACT nasal spray daily.     gabapentin  (NEURONTIN ) 600 MG tablet TAKE 1 TABLET THREE TIMES A DAY (DOSE CHANGE) 270 tablet 3   glucose blood (FREESTYLE LITE) test strip Use as instructed 100 strip 3   HYDROcodone -acetaminophen  (NORCO) 5-325 MG tablet Take 1 tablet by mouth 2 (two) times daily as needed for moderate pain. 50 tablet 0   Lancets (FREESTYLE) lancets USE AS INSTRUCTED TO CHECK BLOOD SUGAR ONCE DAILY 100 each 1   Magnesium  250 MG TABS Take 250 mg by mouth 2 (two) times daily.     nystatin  (MYCOSTATIN /NYSTOP ) powder Apply 1  Application topically 3 (three) times daily. 30 g 1   omeprazole  (PRILOSEC) 40 MG capsule Take 1 capsule (40 mg total) by mouth daily. 180 capsule 1   Polyvinyl Alcohol  (LIQUID TEARS OP) Place 1 drop into the right eye 2 (two) times daily.     potassium chloride  SA (KLOR-CON  M) 20 MEQ tablet TAKE 2 TABLETS TWICE A DAY 360 tablet 3   rosuvastatin  (CRESTOR) 20 MG tablet Take 1 tablet (20 mg total) by mouth daily. 90 tablet 2   temazepam  (RESTORIL ) 15 MG capsule Take 1 capsule (15 mg total) by mouth at bedtime as needed for sleep. 90 capsule 1   Tiotropium Bromide (SPIRIVA RESPIMAT) 1.25 MCG/ACT AERS Inhale 2 puffs into the lungs daily. 12 g 4   torsemide (DEMADEX) 20 MG tablet Take 2 tablets (40 mg total) by mouth 2 (two) times daily. 360 tablet 2   ursodiol  (ACTIGALL ) 300 MG capsule Take 2 capsules (600 mg total) by mouth 2 (two) times daily. 360 capsule 2   Vitamin D , Ergocalciferol , 50000 units CAPS Take 50,000 Units by mouth every Friday. 4 capsule 2   levothyroxine  (SYNTHROID ) 112 MCG tablet Take 1 tablet (112 mcg total) by mouth daily. 90 tablet 3   No current facility-administered medications for this visit.    PHYSICAL EXAM: Vitals:   06/17/24 0815  BP: 118/60  Pulse: 80  SpO2: 99%  Weight: 241 lb 12.8 oz (109.7 kg)  Height: 5' 7 (1.702 m)    Body mass index is 37.87 kg/m.  Wt Readings from Last 3 Encounters:  06/17/24 241 lb 12.8 oz (109.7 kg)  06/11/24 243 lb 12.8 oz (110.6 kg)  05/20/24 239 lb 6.4 oz (108.6 kg)    General: Well developed, well nourished female in no apparent distress.  HEENT: AT/[place], no external lesions.  Eyes: Conjunctiva clear and no icterus. Neck: Neck supple  Lungs: Respirations not labored Neurologic: Alert, oriented, normal speech Extremities / Skin: Dry.  Psychiatric: Does not appear depressed or anxious  Diabetic Foot Exam - Simple   No data filed    LABS Reviewed Lab Results  Component Value Date   HGBA1C 6.7 (A) 06/17/2024   HGBA1C 6.7 (A) 03/14/2024   HGBA1C 6.5 (A) 11/12/2023   No results found for: FRUCTOSAMINE Lab Results  Component Value Date   CHOL 195 05/05/2024   HDL 94 05/05/2024   LDLCALC 87 05/05/2024   TRIG 58 05/05/2024   CHOLHDL 2.1 05/05/2024   Lab Results  Component Value Date   MICRALBCREAT <15 06/26/2023   Lab Results  Component Value Date    CREATININE 1.09 (H) 05/05/2024   Lab Results  Component Value Date   GFR 50.23 (L) 07/06/2023    ASSESSMENT / PLAN  1. DM type 2 with diabetic peripheral neuropathy (HCC)   2. Acquired autoimmune hypothyroidism   3. Hypoglycemia following gastrointestinal surgery   4. History of gastric surgery     Diabetes Mellitus type 2, complicated by peripheral neuropathy/CKD - Diabetic status / severity: Fair control  Lab Results  Component Value Date   HGBA1C 6.7 (A) 06/17/2024    - Hemoglobin A1c goal : <7%  - Medications: No change  I) continue acarbose  50 mg with meals 3 times a day.  - Home glucose testing: In the morning fasting and few times a week in the afternoon and at bedtime and as needed. - Discussed/ Gave Hypoglycemia treatment plan.  Patient is advised to eat protein rich/mixed meal diet and avoid pure  sugary food to avoid hypoglycemia.  Patient is advised to carry glucose tablet all the time.  # Consult , not required this time.  # Annual urine for microalbuminuria/ creatinine ratio, no microalbuminuria currently.  Referred to nephrology due to CKD 3B in the previous visit.  She has plan to see nephrology in the near future.  Not able to provide urine sample for urine microalbumin creatinine ratio today. Last  Lab Results  Component Value Date   MICRALBCREAT <15 06/26/2023    # Foot check nightly / neuropathy, on gabapentin  and nortriptyline .  Following with podiatry.  Patient reports she had visit in June of this year and had foot exam as well.  # Annual dilated diabetic eye exams.   - Diet: Make healthy diabetic food choices - Life style / activity / exercise: Discussed.  2. Blood pressure  -  BP Readings from Last 1 Encounters:  06/17/24 118/60    - Control is in target.  - No change in current plans.  3. Lipid status / Hyperlipidemia - Last  Lab Results  Component Value Date   LDLCALC 87 05/05/2024   - Continue pravastatin  10 mg daily.   Managed by primary care provider.  # Postprandial hypoglycemia related with history of gastric bypass surgery. -Continue acarbose  50 mg with meals 3 times a day.  # Primary hypothyroidism -Continue levothyroxine  112 mcg daily.  Annual lab.  Recent thyroid  function test normal.  Kelli Martinez was seen today for follow-up.  Diagnoses and all orders for this visit:  DM type 2 with diabetic peripheral neuropathy (HCC) -     POCT glycosylated hemoglobin (Hb A1C) -     Microalbumin / creatinine urine ratio  Acquired autoimmune hypothyroidism -     levothyroxine  (SYNTHROID ) 112 MCG tablet; Take 1 tablet (112 mcg total) by mouth daily.  Hypoglycemia following gastrointestinal surgery  History of gastric surgery     DISPOSITION Follow up in clinic in 4 months suggested.    All questions answered and patient verbalized understanding of the plan.  Annette Liotta, MD [facility] Endocrinology [facility] [address] [place], [address] [place], [place] [address] Phone # [phone]  At least part of this note was generated using voice recognition software. Inadvertent word errors may have occurred, which were not recognized during the proofreading process.

## 2024-06-18 ENCOUNTER — Ambulatory Visit: Payer: Self-pay | Admitting: Student in an Organized Health Care Education/Training Program

## 2024-06-18 DIAGNOSIS — R072 Precordial pain: Secondary | ICD-10-CM

## 2024-06-18 DIAGNOSIS — R931 Abnormal findings on diagnostic imaging of heart and coronary circulation: Secondary | ICD-10-CM

## 2024-06-18 LAB — MAGNESIUM: Magnesium: 2.1 mg/dL (ref 1.6–2.3)

## 2024-06-18 LAB — BASIC METABOLIC PANEL WITH GFR
BUN/Creatinine Ratio: 17 (ref 12–28)
BUN: 15 mg/dL (ref 8–27)
CO2: 25 mmol/L (ref 20–29)
Calcium: 9.8 mg/dL (ref 8.7–10.3)
Chloride: 103 mmol/L (ref 96–106)
Creatinine, Ser: 0.88 mg/dL (ref 0.57–1.00)
Glucose: 119 mg/dL — ABNORMAL HIGH (ref 70–99)
Potassium: 4.6 mmol/L (ref 3.5–5.2)
Sodium: 141 mmol/L (ref 134–144)
eGFR: 68 mL/min/1.73 (ref >59)

## 2024-06-20 ENCOUNTER — Telehealth: Payer: Self-pay

## 2024-06-20 DIAGNOSIS — I890 Lymphedema, not elsewhere classified: Secondary | ICD-10-CM

## 2024-06-20 NOTE — Telephone Encounter (Signed)
-----   Message from Nurse Avelina A sent at 06/11/2024 12:51 PM EST ----- Regarding: lymphedema clinic  Patient needs referral to lymphedema clinic.  She lives in [place] and the only ones I knew of were in [place] and [place].  It is too far for her to get to these clinics  Below is another option that was suggested.  I called the number listed below but was on hold for a long time and couldn't wait any longer.  Can you follow up and see if you are able to reach this clinic regarding a referral or to see if they may have a clinic closer to the patient's home   There is one at [facility] at [facility]  ph: [phone]  Thanks, Bruna

## 2024-06-20 NOTE — Telephone Encounter (Signed)
 Contacted patient and she reports to contact her daughter. Attempted to contact daughter, but was unable to leave a message. Will attempt to contact again. Unable to leave a message.

## 2024-06-20 NOTE — Telephone Encounter (Signed)
 Daughter Gennie) returned RN's call.

## 2024-06-23 ENCOUNTER — Encounter (HOSPITAL_COMMUNITY): Payer: Self-pay

## 2024-06-24 NOTE — Addendum Note (Signed)
 Addended by: MANDA BOTTCHER B on: 06/24/2024 04:03 PM   Modules accepted: Orders

## 2024-06-24 NOTE — Telephone Encounter (Signed)
 Referral for lymphedema clinic placed and spoke with daughter. She is aware of the external referral.

## 2024-06-25 ENCOUNTER — Ambulatory Visit (HOSPITAL_COMMUNITY)
Admission: RE | Admit: 2024-06-25 | Discharge: 2024-06-25 | Disposition: A | Discharge status: Home / Self Care | Source: Ambulatory Visit | Attending: Cardiology | Admitting: Cardiology

## 2024-06-25 DIAGNOSIS — R072 Precordial pain: Secondary | ICD-10-CM | POA: Insufficient documentation

## 2024-06-25 MED ORDER — IOHEXOL 350 MG/ML SOLN: 100.0000 mL | Freq: Once | INTRAVENOUS | Status: DC | PRN

## 2024-06-25 MED ORDER — NITROGLYCERIN 0.4 MG SL SUBL
0.8000 mg | SUBLINGUAL_TABLET | Freq: Once | SUBLINGUAL | Status: AC
  Administered 2024-06-25: 0.8 mg via SUBLINGUAL

## 2024-07-01 ENCOUNTER — Telehealth: Payer: Self-pay

## 2024-07-01 NOTE — Telephone Encounter (Signed)
 Copied from CRM #[id number]. Topic: Clinical - Home Health Verbal Orders >> Jul 01, 2024  9:36 AM Dedra NOVAK wrote: Caller/Agency: Raguel from [facility] Callback Number: [phone] Service Requested: Occupational Therapy Frequency: 1x/wk for 8 wks Any new concerns about the patient? No, but pt did mention that she requested compression sleeve for her RUE but hasn't heard anything

## 2024-07-02 ENCOUNTER — Ambulatory Visit (HOSPITAL_COMMUNITY)
Admission: RE | Admit: 2024-07-02 | Discharge: 2024-07-02 | Disposition: A | Source: Ambulatory Visit | Attending: Student in an Organized Health Care Education/Training Program

## 2024-07-02 DIAGNOSIS — R072 Precordial pain: Secondary | ICD-10-CM | POA: Insufficient documentation

## 2024-07-02 MED ORDER — IOHEXOL 350 MG/ML SOLN
100.0000 mL | Freq: Once | INTRAVENOUS | Status: AC | PRN
  Administered 2024-07-02: 100 mL via INTRAVENOUS

## 2024-07-02 MED ORDER — NITROGLYCERIN 0.4 MG SL SUBL
0.8000 mg | SUBLINGUAL_TABLET | Freq: Once | SUBLINGUAL | Status: AC
  Administered 2024-07-02: 0.8 mg via SUBLINGUAL

## 2024-07-02 MED ORDER — NITROGLYCERIN 0.4 MG SL SUBL
SUBLINGUAL_TABLET | SUBLINGUAL | Status: AC
  Filled 2024-07-02: qty 2

## 2024-07-02 NOTE — Telephone Encounter (Signed)
 Called Kelli Martinez from [facility] left  a VM for VO per Dr. Mercer

## 2024-07-04 ENCOUNTER — Other Ambulatory Visit: Payer: Self-pay | Admitting: Internal Medicine

## 2024-07-04 ENCOUNTER — Telehealth: Payer: Self-pay | Admitting: Student in an Organized Health Care Education/Training Program

## 2024-07-04 ENCOUNTER — Other Ambulatory Visit: Payer: Self-pay | Admitting: Family Medicine

## 2024-07-04 DIAGNOSIS — I2782 Chronic pulmonary embolism: Secondary | ICD-10-CM

## 2024-07-04 MED ORDER — APIXABAN 5 MG PO TABS: 5.0000 mg | ORAL_TABLET | Freq: Two times a day (BID) | ORAL | 1 refills | Status: AC

## 2024-07-04 MED ORDER — APIXABAN 5 MG PO TABS: 5.0000 mg | ORAL_TABLET | Freq: Two times a day (BID) | ORAL | 0 refills | Status: DC

## 2024-07-04 NOTE — Telephone Encounter (Signed)
 Eliquis  5mg  refill request received. Patient is 78 years old, weight-109.7kg, Crea-0.88 on 06/17/24, Diagnosis-DVT/PE, and last seen by Dr. Floretta on 06/11/24. Dose is appropriate based on dosing criteria. Will send in refill to requested pharmacy.   Last refill sent to the [facility] pharmacy on 06/11/24 and the request states send to [facility] and a week supply to [facility] in [place].

## 2024-07-04 NOTE — Telephone Encounter (Signed)
*  STAT* If patient is at the pharmacy, call can be transferred to refill team.   1. Which medications need to be refilled? (please list name of each medication and dose if known) apixaban  (ELIQUIS ) 5 MG TABS tablet  Take 1 tablet (5 mg total) by mouth 2 (two) times daily.   2. Would you like to learn more about the convenience, safety, & potential cost savings by using the [facility]? No     3. Are you open to using the [facility] No   4. Which pharmacy/location (including street and city if local pharmacy) is medication to be sent to? [facility] - [place], [place] - [address]    5. Do they need a 30 day or 90 day supply? 90 Day Supply  Patient would like a week supply sent to [id number] - [place], [place] - [address] AT [address] to last until the [facility].

## 2024-07-15 ENCOUNTER — Other Ambulatory Visit: Payer: Self-pay | Admitting: Student in an Organized Health Care Education/Training Program

## 2024-07-15 DIAGNOSIS — I2782 Chronic pulmonary embolism: Secondary | ICD-10-CM

## 2024-07-17 LAB — LIPID PANEL
Chol/HDL Ratio: 1.9 ratio (ref 0.0–4.4)
Cholesterol, Total: 178 mg/dL (ref 100–199)
HDL: 93 mg/dL (ref >39)
LDL Chol Calc (NIH): 74 mg/dL (ref 0–99)
Triglycerides: 59 mg/dL (ref 0–149)
VLDL Cholesterol Cal: 11 mg/dL (ref 5–40)

## 2024-07-18 ENCOUNTER — Telehealth: Payer: Self-pay | Admitting: Student in an Organized Health Care Education/Training Program

## 2024-07-18 MED ORDER — ROSUVASTATIN CALCIUM 20 MG PO TABS: 40.0000 mg | ORAL_TABLET | Freq: Every day | ORAL | 2 refills | Status: AC

## 2024-07-18 NOTE — Telephone Encounter (Signed)
 Clarified patient pharmacy. Updated script for Crestor  40mg  sent in.

## 2024-07-18 NOTE — Telephone Encounter (Signed)
 Pt c/o medication issue:  1. Name of Medication:  rosuvastatin  (CRESTOR ) 20 MG tablet   2. How are you currently taking this medication (dosage and times per day)? As written  3. Are you having a reaction (difficulty breathing--STAT)? no  4. What is your medication issue?   Pt needs a new prescription saying the increase pt does not have enough medication for two a day.

## 2024-07-28 ENCOUNTER — Ambulatory Visit: Admitting: Podiatry

## 2024-08-08 ENCOUNTER — Ambulatory Visit: Admitting: Podiatry

## 2024-08-08 ENCOUNTER — Encounter: Payer: Self-pay | Admitting: Podiatry

## 2024-08-08 ENCOUNTER — Encounter (INDEPENDENT_AMBULATORY_CARE_PROVIDER_SITE_OTHER): Payer: Medicare Other | Admitting: Ophthalmology

## 2024-08-08 DIAGNOSIS — B351 Tinea unguium: Secondary | ICD-10-CM | POA: Diagnosis not present

## 2024-08-08 DIAGNOSIS — I10 Essential (primary) hypertension: Secondary | ICD-10-CM

## 2024-08-08 DIAGNOSIS — H35033 Hypertensive retinopathy, bilateral: Secondary | ICD-10-CM

## 2024-08-08 DIAGNOSIS — H353114 Nonexudative age-related macular degeneration, right eye, advanced atrophic with subfoveal involvement: Secondary | ICD-10-CM | POA: Diagnosis not present

## 2024-08-08 DIAGNOSIS — M79672 Pain in left foot: Secondary | ICD-10-CM | POA: Diagnosis not present

## 2024-08-08 DIAGNOSIS — H348322 Tributary (branch) retinal vein occlusion, left eye, stable: Secondary | ICD-10-CM

## 2024-08-08 DIAGNOSIS — M79671 Pain in right foot: Secondary | ICD-10-CM

## 2024-08-08 DIAGNOSIS — H43811 Vitreous degeneration, right eye: Secondary | ICD-10-CM

## 2024-08-08 NOTE — Progress Notes (Signed)
 Patient presents for evaluation and treatment of tenderness and some redness around nails feet.  Tenderness around toes with walking and wearing shoes.  Physical exam:  General appearance: Alert, pleasant, and in no acute distress.  Vascular: Pedal pulses: DP 2/4 B/L, PT 0/4 B/L.  Severe edema lower legs bilaterally.  Capillary refill time immediate bilaterally  Neurologic:  Dermatologic:  Nails thickened, disfigured, discolored 1-5 BL with subungual debris.  Redness and hypertrophic nail folds along nail folds bilaterally but no signs of drainage or infection.  Musculoskeletal:     Diagnosis: 1. Painful onychomycotic nails 1 through 5 bilaterally. 2. Pain toes 1 through 5 bilaterally.  Plan: -Debrided onychomycotic nails 1 through 5 bilaterally.  Sharply debrided nails with nail clipper and reduced with a power bur.  Return 3 months [facility]

## 2024-08-11 ENCOUNTER — Other Ambulatory Visit: Payer: Self-pay | Admitting: Family Medicine

## 2024-08-11 DIAGNOSIS — R252 Cramp and spasm: Secondary | ICD-10-CM

## 2024-08-11 DIAGNOSIS — I872 Venous insufficiency (chronic) (peripheral): Secondary | ICD-10-CM

## 2024-08-20 ENCOUNTER — Telehealth: Payer: Self-pay

## 2024-08-20 NOTE — Telephone Encounter (Signed)
 Ok.

## 2024-08-20 NOTE — Telephone Encounter (Signed)
 Please advise ?  Copied from CRM #[id number]. Topic: Clinical - Home Health Verbal Orders >> Aug 20, 2024 11:14 AM Chasity T wrote: Caller/Agency: [facility]  Callback Number: [phone] Service Requested: Occupational Therapy Frequency: 1x4 weeks every other week for 4 weeks Any new concerns about the patient? No >> Aug 20, 2024 11:15 AM Chasity T wrote: A message can be left if she is unable to answer

## 2024-08-21 NOTE — Telephone Encounter (Signed)
 Spoke with [facility] VO given for O.T. Per Dr. Mercer

## 2024-08-22 ENCOUNTER — Telehealth: Payer: Self-pay | Admitting: Student in an Organized Health Care Education/Training Program

## 2024-08-22 NOTE — Telephone Encounter (Signed)
 Pt states she thinks she has a blood clot in her right arm. It is red, inflamed and tender to the touch. She also has some pain and weakness in her upper right thigh. Please advise.     Pt c/o of Chest Pain: STAT if active CP, including tightness, pressure, jaw pain, radiating pain to shoulder/upper arm/back, CP unrelieved by Nitro. Symptoms reported of SOB, nausea, vomiting, sweating.  1. Are you having CP right now? Yes    2. Are you experiencing any other symptoms (ex. SOB, nausea, vomiting, sweating)? No    3. Is your CP continuous or coming and going? Coming and going    4. Have you taken Nitroglycerin ? No    5. How long have you been experiencing CP? Since 4 am

## 2024-08-22 NOTE — Telephone Encounter (Signed)
 Pt calling to get message delivered to Dr Floretta. Pt going to ER in [place] (where she lives) for redness, inflammation and tenderness to R upper arm that has been going on all week. Pt also having pain in R upper thigh.  Pt also having twitching in right side of her face.  Pc/o chest pain and indigestion, burping that started around 4 am today. Pt also states she takes medication that makes her burp to relieve gas.  Pt states she has been having this for a long time and usually can rub it and goes away. This time it went across her whole mouth and then went away. Denies slurred speech.  History of blood clots. No recent travel. Seen at [facility] recently for injections in bilateral knees.  Daughter coming to take patient to ER. Will send to Dr Floretta for updates. Verbalizes understanding of plan.

## 2024-08-27 ENCOUNTER — Telehealth: Payer: Self-pay | Admitting: *Deleted

## 2024-08-27 NOTE — Transitions of Care (Post Inpatient/ED Visit) (Signed)
 "  08/27/2024  Name: Kelli Martinez MRN: [id number] DOB: [birth year]  Today's TOC FU Call Status: Today's TOC FU Call Status:: Successful TOC FU Call Completed TOC FU Call Complete Date: 08/27/24  Patient's Name and Date of Birth confirmed. Name, DOB  Transition Care Management Follow-up Telephone Call Date of Discharge: 08/26/24 Discharge Facility: Other [facility]) Name of Other (Non[facility]) Discharge Facility: [facility] Type of Discharge: Inpatient Admission Primary Inpatient Discharge Diagnosis:: Cellulitis of extremity rt arm How have you been since you were released from the hospital?: Better Any questions or concerns?: No  Items Reviewed: Did you receive and understand the discharge instructions provided?: Yes Medications obtained,verified, and reconciled?: Yes (Medications Reviewed) Any new allergies since your discharge?: No Dietary orders reviewed?: No Do you have support at home?: Yes People in Home [RPT]: child(ren), adult Name of Support/Comfort Primary Source: [name]  Medications Reviewed Today: Medications Reviewed Today     Reviewed by Kennieth Cathlean DEL, RN (Case Manager) on 08/27/24 at 1113  Med List Status: <None>   Medication Order Taking? Sig Documenting Provider Last Dose Status Informant  acarbose  (PRECOSE ) 50 MG tablet [id number] Yes Take 1 tablet (50 mg total) by mouth 3 (three) times daily. Thapa, [place], MD  Active   albuterol  (PROAIR  HFA) 108 (90 Base) MCG/ACT inhaler [id number] Yes 2 puffs daily at 6 (six) AM. [provider]  Active   apixaban  (ELIQUIS ) 5 MG TABS tablet [id number] Yes Take 1 tablet (5 mg total) by mouth 2 (two) times daily. Floretta Mallard, MD  Active   baclofen  (LIORESAL ) 20 MG tablet [id number] Yes TAKE 1 TABLET THREE TIMES A DAY (UPDATED PRESCRIPTION) [name], MD  Active   Blood Glucose Monitoring Suppl (FREESTYLE FREEDOM LITE) w/Device KIT [id number] Yes Use as directed to  check blood sugar once daily. Von Pacific, MD  Active Self, Pharmacy Records  Calcium  Carb-Cholecalciferol [facility] BONE HEALTH PO) [id number] Yes Take 1 tablet by mouth daily. [provider]  Active Self  Calcium  Citrate (CALCITRATE PO) [id number] Yes Take 1 tablet by mouth daily. [provider]  Active Self           Med Note (COFFELL, JON HERO   Thu Jan 04, 2023 12:42 PM) Pt takes an additional calcium  supplement + Caltrate.  clobetasol  cream (TEMOVATE ) 0.05 % [id number]  Apply topically 2 (two) times daily. [name], MD  Active   Cyanocobalamin  (VITAMIN B-12 PO) [id number] Yes Take 1 tablet by mouth every other day. [provider]  Active Self  dorzolamide-timolol (COSOPT) 2-0.5 % ophthalmic solution [id number] Yes Place 1 drop into the left eye 2 (two) times daily. [provider]  Active Self, Pharmacy Records  Elastic Bandages & Supports (TRUFORM ARM SLEEVE L 15-20MMHG) MISC [id number] Yes 1 Device by Does not apply route as directed. [name], MD  Active   fluticasone  (FLONASE ) 50 MCG/ACT nasal spray [id number] Yes daily. [provider]  Active   gabapentin  (NEURONTIN ) 600 MG tablet [id number] Yes TAKE 1 TABLET THREE TIMES A DAY (DOSE CHANGE) [name], MD  Active   glucose blood (FREESTYLE LITE) test strip [id number] Yes Use as instructed Thapa, [place], MD  Active   HYDROcodone -acetaminophen  (NORCO) 5-325 MG tablet [id number] Yes Take 1 tablet by mouth 2 (two) times daily as needed for moderate pain. Von Pacific, MD  Active Self, Pharmacy Records  Lancets (FREESTYLE) lancets [id number] Yes USE AS INSTRUCTED TO CHECK BLOOD SUGAR ONCE DAILY Von Pacific, MD  Active Self, Pharmacy Records  levothyroxine  (SYNTHROID ) 112 MCG tablet [id number] Yes Take 1 tablet (112 mcg total) by mouth daily. Thapa, Sudan, MD  Active   Magnesium  250 MG TABS [id number] Yes Take 250 mg by mouth 2 (two) times daily. [provider]  Active Self   nystatin  (MYCOSTATIN /NYSTOP ) powder [id number] Yes Apply 1 Application topically 3 (three) times daily. [name], MD  Active   omeprazole  (PRILOSEC) 40 MG capsule [id number] Yes TAKE 1 CAPSULE DAILY Dorsey, Ying C, MD  Active   Polyvinyl Alcohol  (LIQUID TEARS OP) [id number] Yes Place 1 drop into the right eye 2 (two) times daily. [provider]  Active Self  potassium chloride  SA (KLOR-CON  M) 20 MEQ tablet [id number] Yes TAKE 2 TABLETS TWICE A DAY [name], MD  Active   rosuvastatin  (CRESTOR ) 20 MG tablet [id number] Yes Take 2 tablets (40 mg total) by mouth daily. Floretta Mallard, MD  Active   temazepam  (RESTORIL ) 15 MG capsule [id number] Yes Take 1 capsule (15 mg total) by mouth at bedtime as needed for sleep. Neysa Rama D, MD  Active   Tiotropium Bromide (SPIRIVA  RESPIMAT) 1.25 MCG/ACT AERS [id number] Yes Inhale 2 puffs into the lungs daily. Neysa Rama D, MD  Active   torsemide  (DEMADEX ) 20 MG tablet [id number] Yes Take 2 tablets (40 mg total) by mouth 2 (two) times daily. Floretta Mallard, MD  Active   ursodiol  (ACTIGALL ) 300 MG capsule [id number] Yes Take 2 capsules (600 mg total) by mouth 2 (two) times daily. Craig Alan SAUNDERS, PA-C  Active   Vitamin D , Ergocalciferol , 50000 units CAPS [id number] Yes Take 50,000 Units by mouth every Friday. Thapa, Sudan, MD  Active   Med List Note Vernel, Clinton D, MD 03/26/11 1731): CPAP 6 [employer]            Home Care and Equipment/Supplies: Were Home Health Services Ordered?: NA Any new equipment or medical supplies ordered?: NA  Functional Questionnaire: Do you need assistance with meal preparation?: No Do you need assistance with eating?: No Do you have difficulty maintaining continence: No Do you need assistance with getting out of bed/getting out of a chair/moving?: Yes (Uses a rollator walker) Do you have difficulty managing or taking your medications?: No  Follow up appointments reviewed: PCP Follow-up  appointment confirmed?: Yes Date of PCP follow-up appointment?: 08/26/24 Follow-up Provider: [facility] Follow-up appointment confirmed?: NA Do you need transportation to your follow-up appointment?: No Do you understand care options if your condition(s) worsen?: Yes-patient verbalized understanding  SDOH Interventions Today    Flowsheet Row Most Recent Value  SDOH Interventions   Food Insecurity Interventions Intervention Not Indicated  Housing Interventions Intervention Not Indicated  Transportation Interventions Intervention Not Indicated, Patient Resources (Friends/Family)  Utilities Interventions Intervention Not Indicated   Discussed and offered 30 day TOC program.  Patient   consented.  The patient has been provided with contact information for the care management team and has been advised to call with any health -related questions or concerns.  The patient verbalized understanding with current plan of care.  The patient is directed to their insurance card regarding availability of benefits coverage   Cathlean Headland BSN RN [facility] Health Care Management Coordinator [email or web address] Direct Dial: [phone]  Fax: [phone] Website: [email or web address]  "

## 2024-08-27 NOTE — Transitions of Care (Post Inpatient/ED Visit) (Signed)
" ° °  08/27/2024  Name: Kelli Martinez MRN: [id number] DOB: May 13, 1946  Today's TOC FU Call Status: Today's TOC FU Call Status:: Unsuccessful Call (1st Attempt) Unsuccessful Call (1st Attempt) Date: 08/27/24  Attempted to reach the patient regarding the most recent Inpatient/ED visit.  Follow Up Plan: Additional outreach attempts will be made to reach the patient to complete the Transitions of Care (Post Inpatient/ED visit) call.   Cathlean Headland BSN RN [facility] Health Care Management Coordinator [email or web address] Direct Dial: [phone]  Fax: [phone] Website: [email or web address]  "

## 2024-08-29 ENCOUNTER — Ambulatory Visit: Payer: Self-pay

## 2024-08-29 NOTE — Telephone Encounter (Signed)
 FYI Only or Action Required?: Action required by provider: clinical question for provider and update on patient condition.  Patient was last seen in primary care on 05/05/2024 by [name], MD.  Called Nurse Triage reporting Medication Problem and Fatigue.  Symptoms began several days ago.  Interventions attempted: Rest, hydration, or home remedies.  Symptoms are: stable.  Triage Disposition: Call PCP Within 24 Hours (overriding See Physician Within 24 Hours)  Patient/caregiver understands and will follow disposition?: Yes                                  1. DESCRIPTION: Describe how you are feeling.     Daughter reports patient is experiencing low every, sleeping a lot and mild lightheadedness/dizziness when standing for long periods of time 2. SEVERITY: How bad is it?  Can you stand and walk?     Mild to moderate 3. ONSET: When did these symptoms begin? (e.g., hours, days, weeks, months)     Monday 4. CAUSE: What do you think is causing the weakness or fatigue? (e.g., not drinking enough fluids, medical problem, trouble sleeping)     Daughter reports patient was hospitalized from Saturday to Monday with cellulitis 5. NEW MEDICINES:  Have you started on any new medicines recently? (e.g., opioid pain medicines, benzodiazepines, muscle relaxants, antidepressants, antihistamines, neuroleptics, beta blockers)     Cephalexin at discharge 6. OTHER SYMPTOMS: Do you have any other symptoms? (e.g., chest pain, fever, cough, SOB, vomiting, diarrhea, bleeding, other areas of pain)     Lack of appetite Denies: difficulty breathing, GI symptoms, headache, fever, pallor    This RN spoke to patient's daughter, [name] (on [place]). Daughter is not with patient at this time. Daughter expressed concern that the antibiotic the patient has been taking is contributing to symptoms. Patient is scheduled for HFU on 09/08/24. Based on symptoms, this RN advised  that patient be evaluated sooner. No sooner availability. Please advise on symptoms. Daughter would appreciate provider's input. Daughter would like a call back.   Reason for Disposition  [1] MODERATE weakness (e.g., interferes with work, school, normal activities) AND [2] persists > 3 days  Protocols used: Weakness (Generalized) and Fatigue-A-AH  Copied from CRM [id number]. Topic: Clinical - Medical Advice >> Aug 29, 2024 11:01 AM Delon DASEN wrote: Reason for CRM: daughter concerned antibiotics are a little too strong for patient, she is very tired and not much of an appetite, please call [name] [phone]

## 2024-08-29 NOTE — Telephone Encounter (Signed)
 Called and spoke with patient, patient is having dizziness, fatigue, lightness, and has not been able to eat, patient is also not taking Keflex as prescribed at the hospital, cellulitis is coming back per patient patient denies any fever or chills, Per Dr, Mercer patient is to go to the ED to be seen, Called and also spoke with Daughter per [facility], both are aware of Dr.Banks' advisement

## 2024-09-02 ENCOUNTER — Telehealth: Payer: Self-pay

## 2024-09-04 ENCOUNTER — Telehealth: Payer: Self-pay

## 2024-09-04 NOTE — Telephone Encounter (Signed)
 Pt called yesterday 09/03/24 and says she needs a follow -up appt with Dr. Mercie because she was seen in the ED at [facility] at 08/31/24. Please contact pt to schedule a appt.

## 2024-09-08 ENCOUNTER — Inpatient Hospital Stay: Admitting: Family Medicine

## 2024-09-08 ENCOUNTER — Ambulatory Visit: Admitting: Family Medicine

## 2024-09-08 ENCOUNTER — Ambulatory Visit: Admitting: Student in an Organized Health Care Education/Training Program

## 2024-09-09 ENCOUNTER — Ambulatory Visit: Admitting: Student in an Organized Health Care Education/Training Program

## 2024-09-15 ENCOUNTER — Encounter: Admitting: Pulmonary Disease

## 2024-09-16 ENCOUNTER — Encounter: Admitting: Pulmonary Disease

## 2024-10-21 ENCOUNTER — Ambulatory Visit: Admitting: Endocrinology

## 2024-10-22 ENCOUNTER — Ambulatory Visit

## 2024-11-07 ENCOUNTER — Ambulatory Visit: Admitting: Podiatry
